# Patient Record
Sex: Female | Born: 1975 | Race: White | Hispanic: No | Smoking: Current some day smoker
Health system: Southern US, Community
[De-identification: ages and names within clinical notes are randomized; demographics above are authoritative.]

## PROBLEM LIST (undated history)

## (undated) DIAGNOSIS — B2790 Infectious mononucleosis, unspecified without complication: Secondary | ICD-10-CM

## (undated) DIAGNOSIS — F411 Generalized anxiety disorder: Secondary | ICD-10-CM

## (undated) DIAGNOSIS — E039 Hypothyroidism, unspecified: Secondary | ICD-10-CM

## (undated) DIAGNOSIS — K7010 Alcoholic hepatitis without ascites: Secondary | ICD-10-CM

## (undated) DIAGNOSIS — K529 Noninfective gastroenteritis and colitis, unspecified: Secondary | ICD-10-CM

## (undated) DIAGNOSIS — H209 Unspecified iridocyclitis: Secondary | ICD-10-CM

## (undated) DIAGNOSIS — N926 Irregular menstruation, unspecified: Secondary | ICD-10-CM

## (undated) DIAGNOSIS — R1031 Right lower quadrant pain: Secondary | ICD-10-CM

## (undated) DIAGNOSIS — K449 Diaphragmatic hernia without obstruction or gangrene: Secondary | ICD-10-CM

## (undated) DIAGNOSIS — H93299 Other abnormal auditory perceptions, unspecified ear: Secondary | ICD-10-CM

## (undated) DIAGNOSIS — R1013 Epigastric pain: Secondary | ICD-10-CM

## (undated) DIAGNOSIS — K219 Gastro-esophageal reflux disease without esophagitis: Secondary | ICD-10-CM

## (undated) DIAGNOSIS — I471 Supraventricular tachycardia, unspecified: Secondary | ICD-10-CM

## (undated) DIAGNOSIS — N83209 Unspecified ovarian cyst, unspecified side: Secondary | ICD-10-CM

## (undated) DIAGNOSIS — F410 Panic disorder [episodic paroxysmal anxiety] without agoraphobia: Secondary | ICD-10-CM

## (undated) DIAGNOSIS — M797 Fibromyalgia: Secondary | ICD-10-CM

## (undated) DIAGNOSIS — M549 Dorsalgia, unspecified: Secondary | ICD-10-CM

## (undated) DIAGNOSIS — F1010 Alcohol abuse, uncomplicated: Secondary | ICD-10-CM

## (undated) DIAGNOSIS — K226 Gastro-esophageal laceration-hemorrhage syndrome: Secondary | ICD-10-CM

## (undated) DIAGNOSIS — I10 Essential (primary) hypertension: Secondary | ICD-10-CM

## (undated) DIAGNOSIS — B9689 Other specified bacterial agents as the cause of diseases classified elsewhere: Secondary | ICD-10-CM

## (undated) DIAGNOSIS — N760 Acute vaginitis: Secondary | ICD-10-CM

## (undated) DIAGNOSIS — F419 Anxiety disorder, unspecified: Secondary | ICD-10-CM

## (undated) DIAGNOSIS — F1011 Alcohol abuse, in remission: Secondary | ICD-10-CM

## (undated) DIAGNOSIS — K920 Hematemesis: Secondary | ICD-10-CM

## (undated) DIAGNOSIS — Z8719 Personal history of other diseases of the digestive system: Secondary | ICD-10-CM

## (undated) DIAGNOSIS — N898 Other specified noninflammatory disorders of vagina: Secondary | ICD-10-CM

## (undated) DIAGNOSIS — A5901 Trichomonal vulvovaginitis: Secondary | ICD-10-CM

## (undated) DIAGNOSIS — E781 Pure hyperglyceridemia: Secondary | ICD-10-CM

## (undated) DIAGNOSIS — E041 Nontoxic single thyroid nodule: Principal | ICD-10-CM

## (undated) DIAGNOSIS — J45909 Unspecified asthma, uncomplicated: Secondary | ICD-10-CM

## (undated) DIAGNOSIS — R635 Abnormal weight gain: Secondary | ICD-10-CM

## (undated) HISTORY — DX: Other specified bacterial agents as the cause of diseases classified elsewhere: B96.89

## (undated) HISTORY — DX: Personal history of other diseases of the digestive system: Z87.19

## (undated) HISTORY — DX: Dorsalgia, unspecified: M54.9

## (undated) HISTORY — DX: Alcohol abuse, in remission: F10.11

## (undated) HISTORY — PX: COLONOSCOPY: SHX174

## (undated) HISTORY — DX: Acute vaginitis: N76.0

## (undated) HISTORY — DX: Right lower quadrant pain: R10.31

## (undated) HISTORY — DX: Hypothyroidism, unspecified: E03.9

## (undated) HISTORY — DX: Panic disorder (episodic paroxysmal anxiety): F41.1

## (undated) HISTORY — DX: Other abnormal auditory perceptions, unspecified ear: H93.299

## (undated) HISTORY — DX: Fibromyalgia: M79.7

## (undated) HISTORY — DX: Other specified noninflammatory disorders of vagina: N89.8

## (undated) HISTORY — DX: Alcoholic hepatitis without ascites: K70.10

## (undated) HISTORY — DX: Pure hyperglyceridemia: E78.1

## (undated) HISTORY — DX: Alcohol abuse, uncomplicated: F10.10

## (undated) HISTORY — DX: Panic disorder (episodic paroxysmal anxiety): F41.0

## (undated) HISTORY — PX: TOOTH EXTRACTION: SUR596

## (undated) HISTORY — DX: Trichomonal vulvovaginitis: A59.01

## (undated) HISTORY — DX: Noninfective gastroenteritis and colitis, unspecified: K52.9

## (undated) HISTORY — DX: Abnormal weight gain: R63.5

## (undated) HISTORY — DX: Irregular menstruation, unspecified: N92.6

## (undated) HISTORY — DX: Unspecified iridocyclitis: H20.9

## (undated) HISTORY — DX: Nontoxic single thyroid nodule: E04.1

## (undated) HISTORY — DX: Unspecified ovarian cyst, unspecified side: N83.209

## (undated) HISTORY — DX: Epigastric pain: R10.13

## (undated) NOTE — *Deleted (*Deleted)
Laparoscopic Cholecystectomy Procedure Note  Indications: This patient presents with symptomatic gallbladder disease and will undergo laparoscopic cholecystectomy.  Pre-operative Diagnosis: Grade IV Splenic laceration  Post-operative Diagnosis: Same  Surgeon: Sophronia Simas, MD  Assistants: Lannette Donath, MD  Anesthesia: General endotracheal anesthesia  ASA Class: 3  Procedure Details  The patient was seen again in the Holding Room. The risks, benefits, complications, treatment options, and expected outcomes were discussed with the patient. The possibilities of reaction to medication, pulmonary aspiration, perforation of viscus, bleeding, recurrent infection, finding a normal gallbladder, the need for additional procedures, failure to diagnose a condition, the possible need to convert to an open procedure, and creating a complication requiring transfusion or operation were discussed with the patient. The likelihood of improving the patient's symptoms with return to their baseline status is good.  The patient and/or family concurred with the proposed plan, giving informed consent. The site of surgery properly noted. The patient was taken to Operating Room, identified as Kara Stewart and the procedure verified as Laparoscopic Cholecystectomy with Intraoperative Cholangiogram. A Time Out was held and the above information confirmed.  Prior to the induction of general anesthesia, antibiotic prophylaxis was administered. General endotracheal anesthesia was then administered and tolerated well. After the induction, the abdomen was prepped with Chloraprep and draped in sterile fashion. The patient was positioned in the supine position.  ***   Findings: - Grade IV splenic laceration with active hemorrhage - Cirrhotic appearing liver  Estimated Blood Loss: 1500 ml         Drains: 19 Fr Blake drain, left upper quadrant         Specimens:  1) Spleen 2) Liver biopsy       Complications: None;  patient tolerated the procedure well.         Disposition: ICU - extubated and stable.         Condition: stable

---

## 2001-11-11 ENCOUNTER — Encounter: Payer: Self-pay | Admitting: Obstetrics and Gynecology

## 2001-11-11 ENCOUNTER — Ambulatory Visit (HOSPITAL_COMMUNITY): Admission: RE | Admit: 2001-11-11 | Discharge: 2001-11-11 | Payer: Self-pay | Admitting: Obstetrics and Gynecology

## 2010-04-26 ENCOUNTER — Encounter: Payer: Self-pay | Admitting: Internal Medicine

## 2010-04-26 ENCOUNTER — Encounter: Payer: Self-pay | Admitting: Family Medicine

## 2011-10-18 ENCOUNTER — Encounter (HOSPITAL_COMMUNITY): Payer: Self-pay

## 2011-10-18 ENCOUNTER — Emergency Department (HOSPITAL_COMMUNITY)
Admission: EM | Admit: 2011-10-18 | Discharge: 2011-10-18 | Disposition: A | Discharge status: Home / Self Care | Payer: Self-pay | Attending: Emergency Medicine | Admitting: Emergency Medicine

## 2011-10-18 DIAGNOSIS — K219 Gastro-esophageal reflux disease without esophagitis: Secondary | ICD-10-CM | POA: Insufficient documentation

## 2011-10-18 DIAGNOSIS — R079 Chest pain, unspecified: Secondary | ICD-10-CM | POA: Insufficient documentation

## 2011-10-18 DIAGNOSIS — R42 Dizziness and giddiness: Secondary | ICD-10-CM | POA: Insufficient documentation

## 2011-10-18 DIAGNOSIS — R7989 Other specified abnormal findings of blood chemistry: Secondary | ICD-10-CM | POA: Insufficient documentation

## 2011-10-18 DIAGNOSIS — R0602 Shortness of breath: Secondary | ICD-10-CM | POA: Insufficient documentation

## 2011-10-18 DIAGNOSIS — R1110 Vomiting, unspecified: Secondary | ICD-10-CM | POA: Insufficient documentation

## 2011-10-18 DIAGNOSIS — R059 Cough, unspecified: Secondary | ICD-10-CM | POA: Insufficient documentation

## 2011-10-18 DIAGNOSIS — R05 Cough: Secondary | ICD-10-CM | POA: Insufficient documentation

## 2011-10-18 DIAGNOSIS — F411 Generalized anxiety disorder: Secondary | ICD-10-CM | POA: Insufficient documentation

## 2011-10-18 DIAGNOSIS — R197 Diarrhea, unspecified: Secondary | ICD-10-CM | POA: Insufficient documentation

## 2011-10-18 DIAGNOSIS — R109 Unspecified abdominal pain: Secondary | ICD-10-CM | POA: Insufficient documentation

## 2011-10-18 DIAGNOSIS — Z79899 Other long term (current) drug therapy: Secondary | ICD-10-CM | POA: Insufficient documentation

## 2011-10-18 HISTORY — DX: Supraventricular tachycardia, unspecified: I47.10

## 2011-10-18 HISTORY — DX: Supraventricular tachycardia: I47.1

## 2011-10-18 HISTORY — DX: Anxiety disorder, unspecified: F41.9

## 2011-10-18 HISTORY — DX: Gastro-esophageal reflux disease without esophagitis: K21.9

## 2011-10-18 HISTORY — DX: Diaphragmatic hernia without obstruction or gangrene: K44.9

## 2011-10-18 LAB — CBC WITH DIFFERENTIAL/PLATELET
Basophils Relative: 1 % (ref 0–1)
Eosinophils Absolute: 0 10*3/uL (ref 0.0–0.7)
MCH: 33.4 pg (ref 26.0–34.0)
MCHC: 34.4 g/dL (ref 30.0–36.0)
Monocytes Relative: 6 % (ref 3–12)
Neutrophils Relative %: 81 % — ABNORMAL HIGH (ref 43–77)
Platelets: 231 10*3/uL (ref 150–400)
RDW: 12.5 % (ref 11.5–15.5)

## 2011-10-18 LAB — LIPASE, BLOOD: Lipase: 41 U/L (ref 11–59)

## 2011-10-18 LAB — COMPREHENSIVE METABOLIC PANEL
Albumin: 4.3 g/dL (ref 3.5–5.2)
Alkaline Phosphatase: 75 U/L (ref 39–117)
BUN: 9 mg/dL (ref 6–23)
Potassium: 4.1 mEq/L (ref 3.5–5.1)
Sodium: 140 mEq/L (ref 135–145)
Total Protein: 7.8 g/dL (ref 6.0–8.3)

## 2011-10-18 MED ORDER — LORAZEPAM 2 MG/ML IJ SOLN
1.0000 mg | Freq: Once | INTRAMUSCULAR | Status: AC
  Administered 2011-10-18: 1 mg via INTRAVENOUS
  Filled 2011-10-18: qty 1

## 2011-10-18 MED ORDER — KETOROLAC TROMETHAMINE 30 MG/ML IJ SOLN
30.0000 mg | Freq: Once | INTRAMUSCULAR | Status: AC
  Administered 2011-10-18: 30 mg via INTRAVENOUS
  Filled 2011-10-18: qty 1

## 2011-10-18 MED ORDER — DIPHENHYDRAMINE HCL 50 MG/ML IJ SOLN
25.0000 mg | Freq: Once | INTRAMUSCULAR | Status: AC
  Administered 2011-10-18: 25 mg via INTRAVENOUS
  Filled 2011-10-18: qty 1

## 2011-10-18 MED ORDER — METOCLOPRAMIDE HCL 5 MG/ML IJ SOLN
10.0000 mg | Freq: Once | INTRAMUSCULAR | Status: AC
  Administered 2011-10-18: 10 mg via INTRAVENOUS
  Filled 2011-10-18: qty 2

## 2011-10-18 MED ORDER — SODIUM CHLORIDE 0.9 % IV SOLN: 1000.0000 mL | Freq: Once | INTRAVENOUS | Status: DC

## 2011-10-18 MED ORDER — SODIUM CHLORIDE 0.9 % IV SOLN: 1000.0000 mL | INTRAVENOUS | Status: DC

## 2011-10-18 MED ORDER — TRAMADOL-ACETAMINOPHEN 37.5-325 MG PO TABS: ORAL_TABLET | ORAL | Status: DC

## 2011-10-18 MED ORDER — PROMETHAZINE HCL 25 MG RE SUPP: 25.0000 mg | Freq: Four times a day (QID) | RECTAL | Status: DC | PRN

## 2011-10-18 MED ORDER — SODIUM CHLORIDE 0.9 % IV SOLN
1000.0000 mL | Freq: Once | INTRAVENOUS | Status: AC
  Administered 2011-10-18: 1000 mL via INTRAVENOUS
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
## 2011-10-18 NOTE — ED Notes (Signed)
Pt reports getting dizzy about 12:00 today, and has been vomiting since. Also having chest tightness all the time, worse when laying flat

## 2011-10-19 ENCOUNTER — Ambulatory Visit (HOSPITAL_COMMUNITY)
Admit: 2011-10-19 | Discharge: 2011-10-19 | Disposition: A | Discharge status: Home / Self Care | Payer: Self-pay | Attending: Emergency Medicine | Admitting: Emergency Medicine

## 2011-10-19 DIAGNOSIS — K7689 Other specified diseases of liver: Secondary | ICD-10-CM | POA: Insufficient documentation

## 2011-10-19 DIAGNOSIS — R7989 Other specified abnormal findings of blood chemistry: Secondary | ICD-10-CM | POA: Insufficient documentation

## 2011-10-19 DIAGNOSIS — R110 Nausea: Secondary | ICD-10-CM | POA: Insufficient documentation

## 2011-10-19 DIAGNOSIS — R1011 Right upper quadrant pain: Secondary | ICD-10-CM | POA: Insufficient documentation

## 2011-10-26 ENCOUNTER — Emergency Department (HOSPITAL_COMMUNITY): Payer: Self-pay

## 2011-10-26 ENCOUNTER — Encounter (HOSPITAL_COMMUNITY): Payer: Self-pay | Admitting: Emergency Medicine

## 2011-10-26 ENCOUNTER — Emergency Department (HOSPITAL_COMMUNITY)
Admission: EM | Admit: 2011-10-26 | Discharge: 2011-10-26 | Disposition: A | Discharge status: Home / Self Care | Payer: Self-pay | Attending: Emergency Medicine | Admitting: Emergency Medicine

## 2011-10-26 DIAGNOSIS — I498 Other specified cardiac arrhythmias: Secondary | ICD-10-CM | POA: Insufficient documentation

## 2011-10-26 DIAGNOSIS — R5383 Other fatigue: Secondary | ICD-10-CM | POA: Insufficient documentation

## 2011-10-26 DIAGNOSIS — I10 Essential (primary) hypertension: Secondary | ICD-10-CM | POA: Insufficient documentation

## 2011-10-26 DIAGNOSIS — R42 Dizziness and giddiness: Secondary | ICD-10-CM | POA: Insufficient documentation

## 2011-10-26 DIAGNOSIS — R200 Anesthesia of skin: Secondary | ICD-10-CM

## 2011-10-26 DIAGNOSIS — R5381 Other malaise: Secondary | ICD-10-CM | POA: Insufficient documentation

## 2011-10-26 DIAGNOSIS — R209 Unspecified disturbances of skin sensation: Secondary | ICD-10-CM | POA: Insufficient documentation

## 2011-10-26 DIAGNOSIS — K219 Gastro-esophageal reflux disease without esophagitis: Secondary | ICD-10-CM | POA: Insufficient documentation

## 2011-10-26 DIAGNOSIS — Z79899 Other long term (current) drug therapy: Secondary | ICD-10-CM | POA: Insufficient documentation

## 2011-10-26 DIAGNOSIS — F411 Generalized anxiety disorder: Secondary | ICD-10-CM | POA: Insufficient documentation

## 2011-10-26 LAB — CBC WITH DIFFERENTIAL/PLATELET
Eosinophils Absolute: 0.1 10*3/uL (ref 0.0–0.7)
Hemoglobin: 14.9 g/dL (ref 12.0–15.0)
Lymphocytes Relative: 17 % (ref 12–46)
Lymphs Abs: 0.9 10*3/uL (ref 0.7–4.0)
MCH: 33.6 pg (ref 26.0–34.0)
Monocytes Relative: 6 % (ref 3–12)
Neutro Abs: 4.1 10*3/uL (ref 1.7–7.7)
Neutrophils Relative %: 75 % (ref 43–77)
RBC: 4.43 MIL/uL (ref 3.87–5.11)

## 2011-10-26 LAB — URINALYSIS, ROUTINE W REFLEX MICROSCOPIC
Bilirubin Urine: NEGATIVE
Ketones, ur: 15 mg/dL — AB
Nitrite: NEGATIVE
Urobilinogen, UA: 0.2 mg/dL (ref 0.0–1.0)

## 2011-10-26 LAB — COMPREHENSIVE METABOLIC PANEL
Alkaline Phosphatase: 73 U/L (ref 39–117)
BUN: 6 mg/dL (ref 6–23)
CO2: 23 mEq/L (ref 19–32)
Chloride: 98 mEq/L (ref 96–112)
GFR calc Af Amer: >90 mL/min (ref >90)
Glucose, Bld: 94 mg/dL (ref 70–99)
Potassium: 3.8 mEq/L (ref 3.5–5.1)
Total Bilirubin: 1 mg/dL (ref 0.3–1.2)

## 2011-10-26 LAB — URINE MICROSCOPIC-ADD ON

## 2011-10-26 LAB — LIPASE, BLOOD: Lipase: 44 U/L (ref 11–59)

## 2011-10-26 MED ORDER — CYCLOBENZAPRINE HCL 10 MG PO TABS: 10.0000 mg | ORAL_TABLET | Freq: Two times a day (BID) | ORAL | Status: DC | PRN

## 2011-10-26 MED ORDER — ONDANSETRON HCL 4 MG/2ML IJ SOLN
4.0000 mg | Freq: Once | INTRAMUSCULAR | Status: AC
  Administered 2011-10-26: 4 mg via INTRAVENOUS
  Filled 2011-10-26: qty 2

## 2011-10-26 MED ORDER — SODIUM CHLORIDE 0.9 % IV BOLUS (SEPSIS)
1000.0000 mL | Freq: Once | INTRAVENOUS | Status: AC
  Administered 2011-10-26: 1000 mL via INTRAVENOUS

## 2011-10-26 MED ORDER — OXYCODONE-ACETAMINOPHEN 5-325 MG PO TABS: 1.0000 | ORAL_TABLET | Freq: Four times a day (QID) | ORAL | Status: DC | PRN

## 2011-10-26 MED ORDER — LORAZEPAM 1 MG PO TABS
1.0000 mg | ORAL_TABLET | Freq: Once | ORAL | Status: AC
Start: 2011-10-26 — End: 2011-10-26
  Administered 2011-10-26: 1 mg via ORAL
  Filled 2011-10-26: qty 1

## 2011-10-26 MED ORDER — PANTOPRAZOLE SODIUM 40 MG IV SOLR
40.0000 mg | Freq: Once | INTRAVENOUS | Status: AC
  Administered 2011-10-26: 40 mg via INTRAVENOUS
  Filled 2011-10-26: qty 40

## 2011-10-26 NOTE — ED Notes (Signed)
Pt states woke up with GI issues, GERD symptoms. Then started having weakness with tinglining and numbness on right side.

## 2011-10-26 NOTE — ED Notes (Signed)
Patient states she is still in extreme pain. RN is aware.

## 2011-10-26 NOTE — ED Notes (Signed)
Patient returned from MRI.

## 2011-10-26 NOTE — ED Notes (Signed)
Family at bedside. 

## 2011-10-26 NOTE — ED Notes (Signed)
Patient transported to MRI 

## 2011-10-26 NOTE — ED Notes (Signed)
Patient would like something to eat at this time. 

## 2011-10-26 NOTE — ED Notes (Signed)
Pt is still in MRI.

## 2011-10-26 NOTE — ED Provider Notes (Addendum)
History   This chart was scribed for Kara Hutching, MD by Sofie Rower. The patient was seen in room [facility] and the patient's care was started at 10:03 AM     CSN: [id number]  Arrival date & time 10/26/11  7829   First MD Initiated Contact with Patient 10/26/11 [phone]      Chief Complaint  Patient presents with  . Extremity Weakness  . Code Stroke    (Consider location/radiation/quality/duration/timing/severity/associated sxs/prior treatment) HPI  Kara Stewart is a 36 y.o. female who presents to the Emergency Department complaining of severe, episodic dizziness onset today with associated symptoms of difficulty swallowing, extremity weakness, extremity numbness. The pt informs the EDP that her right side feels heavy, both arms and legs, as if it has fallen asleep. The pt reports her blood pressure this morning was 153/102. Modifying factors include taking a blood pressure pill which provides moderate relief. Pt also complains of moderate, episodic acid reflux with associated symptoms of abdominal pain. Pt has a hx of hiatal hernia, acid reflux disease, high triglycerides (pt was recommended to go on a diet and refrain from eating fried foods by Dr. Tresa Endo, Cardiologist), SVT.  Pt does not have a PCP. Cardiologist is Dr. Tresa Endo (last visit was over 2 years ago, triglycerides measured, stress test performed).    Past Medical History  Diagnosis Date  . SVT (supraventricular tachycardia)   . Hiatal hernia   . Anxiety   . Acid reflux     History reviewed. No pertinent past surgical history.  History reviewed. No pertinent family history.  History  Substance Use Topics  . Smoking status: Never Smoker   . Smokeless tobacco: Not on file  . Alcohol Use: Yes     occ    OB History    Grav Para Term Preterm Abortions TAB SAB Ect Mult Living                  Review of Systems  All other systems reviewed and are negative.     10 Systems reviewed and all are negative for acute  change except as noted in the HPI.   Allergies  Penicillins  Home Medications   Current Outpatient Rx  Name Route Sig Dispense Refill  . ESOMEPRAZOLE MAGNESIUM 40 MG PO CPDR Oral Take 40 mg by mouth daily.    Marland Kitchen HYDROXYZINE HCL 25 MG PO TABS Oral Take 25 mg by mouth every 6 (six) hours as needed. sleep    . MEDROXYPROGESTERONE ACETATE 150 MG/ML IM SUSP Intramuscular Inject 150 mg into the muscle every 3 (three) months.    Marland Kitchen METOPROLOL TARTRATE 50 MG PO TABS Oral Take 50 mg by mouth as needed. heart    . PROMETHAZINE HCL 25 MG RE SUPP Rectal Place 1 suppository (25 mg total) rectally every 6 (six) hours as needed for nausea. 8 each 0  . TRAMADOL-ACETAMINOPHEN 37.5-325 MG PO TABS  2 tabs po QID prn pain 16 tablet 0    BP 138/88  Pulse 88  Temp 98.4 F (36.9 C) (Oral)  Resp 22  Ht 5\' 5"  (1.651 m)  Wt 200 lb (90.719 kg)  BMI 33.28 kg/m2  SpO2 99%  Physical Exam  Nursing note and vitals reviewed. Constitutional: She is oriented to person, place, and time. She appears well-developed and well-nourished.  HENT:  Head: Atraumatic.  Right Ear: External ear normal.  Left Ear: External ear normal.  Nose: Nose normal.  Neck: Normal range of motion.  Musculoskeletal: Normal range  of motion. She exhibits no tenderness.       Extremities (right arms and legs) shaking. Minor limp noted.   Neurological: She is alert and oriented to person, place, and time.  Skin: Skin is warm and dry.  Psychiatric: She has a normal mood and affect. Her behavior is normal.       No impairment of mental status.     ED Course  Procedures (including critical care time)  DIAGNOSTIC STUDIES: Oxygen Saturation is 99% on room air, normal by my interpretation.    COORDINATION OF CARE:    10:11AM- EDP at bedside discusses treatment plan concerning CT scan, blood work.   Results for orders placed during the hospital encounter of 10/26/11  CBC WITH DIFFERENTIAL      Component Value Range   WBC 5.5  4.0 -  10.5 K/uL   RBC 4.43  3.87 - 5.11 MIL/uL   Hemoglobin 14.9  12.0 - 15.0 g/dL   HCT 16.1  09.6 - 04.5 %   MCV 97.5  78.0 - 100.0 fL   MCH 33.6  26.0 - 34.0 pg   MCHC 34.5  30.0 - 36.0 g/dL   RDW 40.9  81.1 - 91.4 %   Platelets 217  150 - 400 K/uL   Neutrophils Relative 75  43 - 77 %   Neutro Abs 4.1  1.7 - 7.7 K/uL   Lymphocytes Relative 17  12 - 46 %   Lymphs Abs 0.9  0.7 - 4.0 K/uL   Monocytes Relative 6  3 - 12 %   Monocytes Absolute 0.3  0.1 - 1.0 K/uL   Eosinophils Relative 1  0 - 5 %   Eosinophils Absolute 0.1  0.0 - 0.7 K/uL   Basophils Relative 1  0 - 1 %   Basophils Absolute 0.1  0.0 - 0.1 K/uL  COMPREHENSIVE METABOLIC PANEL      Component Value Range   Sodium 136  135 - 145 mEq/L   Potassium 3.8  3.5 - 5.1 mEq/L   Chloride 98  96 - 112 mEq/L   CO2 23  19 - 32 mEq/L   Glucose, Bld 94  70 - 99 mg/dL   BUN 6  6 - 23 mg/dL   Creatinine, Ser 7.82  0.50 - 1.10 mg/dL   Calcium 9.7  8.4 - 95.6 mg/dL   Total Protein 7.6  6.0 - 8.3 g/dL   Albumin 4.1  3.5 - 5.2 g/dL   AST 213 (*) 0 - 37 U/L   ALT 204 (*) 0 - 35 U/L   Alkaline Phosphatase 73  39 - 117 U/L   Total Bilirubin 1.0  0.3 - 1.2 mg/dL   GFR calc non Af Amer 89 (*) >90 mL/min   GFR calc Af Amer >90  >90 mL/min  LIPASE, BLOOD      Component Value Range   Lipase 44  11 - 59 U/L  URINALYSIS, ROUTINE W REFLEX MICROSCOPIC      Component Value Range   Color, Urine YELLOW  YELLOW   APPearance CLEAR  CLEAR   Specific Gravity, Urine 1.025  1.005 - 1.030   pH 6.0  5.0 - 8.0   Glucose, UA NEGATIVE  NEGATIVE mg/dL   Hgb urine dipstick SMALL (*) NEGATIVE   Bilirubin Urine NEGATIVE  NEGATIVE   Ketones, ur 15 (*) NEGATIVE mg/dL   Protein, ur NEGATIVE  NEGATIVE mg/dL   Urobilinogen, UA 0.2  0.0 - 1.0 mg/dL   Nitrite NEGATIVE  NEGATIVE   Leukocytes, UA NEGATIVE  NEGATIVE  URINE MICROSCOPIC-ADD ON      Component Value Range   Squamous Epithelial / LPF MANY (*) RARE   WBC, UA 0-2  <3 WBC/hpf   RBC / HPF 3-6  <3 RBC/hpf    Bacteria, UA MANY (*) RARE   Ct Head Wo Contrast  10/26/2011  *RADIOLOGY REPORT*  Clinical Data: Right arm and right leg numbness, hypertension  CT HEAD WITHOUT CONTRAST  Technique:  Contiguous axial images were obtained from the base of the skull through the vertex without contrast.  Comparison: None  Findings: Normal ventricular morphology. No midline shift or mass effect. Normal appearance brain parenchyma. No intracranial hemorrhage, mass lesion or evidence of acute infarction. No extra-axial fluid collections. Mucosal retention cysts right maxillary sphenoid sinuses. No acute calvarial abnormalities.  IMPRESSION: No acute intracranial abnormalities.  Original Report Authenticated By: Lollie Marrow, M.D.        No diagnosis found.   Date: 10/26/2011  Rate: 89  Rhythm: normal sinus rhythm  QRS Axis: normal  Intervals: normal  ST/T Wave abnormalities: normal  Conduction Disutrbances: none  Narrative Interpretation: unremarkable     MDM  CT scan of head negative. Patient is ambulatory with a limp in right leg. Suspect radicular pain. Will obtain MRI scan of brain to rule out acute stroke.  Discussed with Dr.Zammit      I personally performed the services described in this documentation, which was scribed in my presence. The recorded information has been reviewed and considered.    Kara Hutching, MD 10/26/11 1540  Kara Hutching, MD 10/26/11 [phone]

## 2011-10-26 NOTE — ED Notes (Addendum)
Pt states that she woke with indigestion, reflux type symptoms, took a nexium and felt ?better, pt then started to experience dizziness, headache located in the top of her head, and numbness, tingling, heaviness to entire to right side, pt states that she fell against the wall due to the weakness in her right leg. Pt states that the time the dizziness started was around 8am today, pt speech clear now but pt feels that her speech was "slurred" when she first started getting dizziness.  face is symmetrical,  Pt took her blood pressure at home this am and it was reading 150/102, pt took 1/4 of 50 mg metoprolol  prior to arrival in er. bp 138/88 upon arrival in er, pt able to move all extremities, sensation is the same on both sides,

## 2011-10-26 NOTE — ED Notes (Signed)
Patient was offered a variety of choices of what to eat and the patient refused all. RN aware.

## 2011-10-28 ENCOUNTER — Telehealth: Payer: Self-pay

## 2011-10-28 ENCOUNTER — Ambulatory Visit (INDEPENDENT_AMBULATORY_CARE_PROVIDER_SITE_OTHER): Payer: Self-pay | Admitting: Urgent Care

## 2011-10-28 ENCOUNTER — Encounter: Payer: Self-pay | Admitting: Urgent Care

## 2011-10-28 VITALS — BP 119/84 | HR 87 | Temp 97.4°F | Ht 65.0 in | Wt 199.6 lb

## 2011-10-28 DIAGNOSIS — R7989 Other specified abnormal findings of blood chemistry: Secondary | ICD-10-CM | POA: Insufficient documentation

## 2011-10-28 DIAGNOSIS — R109 Unspecified abdominal pain: Secondary | ICD-10-CM

## 2011-10-28 DIAGNOSIS — K760 Fatty (change of) liver, not elsewhere classified: Secondary | ICD-10-CM

## 2011-10-28 DIAGNOSIS — R1010 Upper abdominal pain, unspecified: Secondary | ICD-10-CM

## 2011-10-28 DIAGNOSIS — K7689 Other specified diseases of liver: Secondary | ICD-10-CM

## 2011-10-28 DIAGNOSIS — R1011 Right upper quadrant pain: Secondary | ICD-10-CM | POA: Insufficient documentation

## 2011-10-28 LAB — HEPATIC FUNCTION PANEL
ALT: 224 U/L — ABNORMAL HIGH (ref 0–35)
Bilirubin, Direct: 0.3 mg/dL (ref 0.0–0.3)
Indirect Bilirubin: 0.5 mg/dL (ref 0.0–0.9)

## 2011-10-28 LAB — HEPATITIS C ANTIBODY: HCV Ab: NEGATIVE

## 2011-10-28 MED ORDER — OMEPRAZOLE 20 MG PO CPDR: 20.0000 mg | DELAYED_RELEASE_CAPSULE | Freq: Every day | ORAL | Status: DC

## 2011-10-28 NOTE — Patient Instructions (Addendum)
Go get your labs today HIDA scan to check function of your gallbladder Begin omeprazole 20mg  daily Stop Nexium Please schedule an appt with a primary care provider.  Dr Jeanice Lim, Dr Lodema Hong or Health Dept just to name a few. Instructions for fatty liver: Recommend 1-2# weight loss per week until ideal body weight through exercise & diet. Low fat/cholesterol diet. Gradually increase exercise from 15 min daily up to 1 hr per day 5 days/week. Limit alcohol use.

## 2011-10-28 NOTE — Progress Notes (Signed)
No PCP on file 

## 2011-10-28 NOTE — Assessment & Plan Note (Signed)
Further work-up (see elevated LFTS) Instructions for fatty liver: Recommend 1-2# weight loss per week until ideal body weight through exercise & diet. Low fat/cholesterol diet. Gradually increase exercise from 15 min daily up to 1 hr per day 5 days/week. Limit alcohol use. Referral given to find PCP to address thyroid & hyperlipidemia

## 2011-10-28 NOTE — Assessment & Plan Note (Addendum)
Kara Stewart is a pleasant 36 y.o. female with several month history of severe upper abdominal pain (mostly RUQ), worse post-prandially & nocturnally, associated with nausea & vomiting that has not responded to Nexium. Symptoms typical for biliary etiology.  Abdominal ultrasound shows fatty liver but no cholecystitis or cholelithiasis.  ? Biliary dyskinesia, GERD, PUD, or less likely viral hepatitis.     HIDA scan, if negative arrange EGD w/ Dr Darrick Penna Stop Nexium Trial omeprazole 20mg  daily If severe pain, go to ER

## 2011-10-28 NOTE — Assessment & Plan Note (Signed)
LFTs 4-5 times elevated in pt with fatty liver & risk factors for liver disease including heavy etoh, hypertriglyceridemia, obesity & questionable thyroid disease.  Denies any current alcohol or new medications.  Will begin lab work-up to r/o NASH, viral hepatitis, autoimmune liver disease, PBC, PSC, Wilson's & A1-AT liver disease.  Further recommendations to follow

## 2011-10-28 NOTE — Telephone Encounter (Signed)
Aram Beecham from [place] called to confirm any [employer] benefits. I called Lubertha Basque and she said she has nothing on the pt. Pt had told Soledad Gerlach when she checked in that she is working on her paperwork for [employer] assistance.  I called Aram Beecham back and informed her, at this time no [facility] benefits.

## 2011-10-28 NOTE — Progress Notes (Signed)
Primary Care Physician:  No primary provider on file. Primary Gastroenterologist:  Dr. Jonette Eva  Chief Complaint  Patient presents with  . Abdominal Pain    pt said she has constant abd pain/ worse when eats    HPI:  Moreen Piggott is a 36 y.o. female here as a new patient for evaluation of GERD, elevated LFTs, & abdominal pain for several months  C/o of what she thought was severe acid reflux, worse nocturnally, but especially made worse by eating.  C/o RUQ & dull chest pain at all times, worse w/ eating.  No particular foods. Pain radiates to umbilicus.  Pain 7/10 at worst.  Does not radiate to back.  C/o nausea & vomiting w/ pain couple times per month.  C/o loose stools 2-3 per day without bleeding or mucus.  Gas & bloating.  Denies fever or chills.  +hot flashes.  Weight gain 30# in past year.  Dx w/ thyroid issues 1 yr ago but no meds needed,.  Tried nexium 40mg  daily no help.  Denies dysphagia or odynophagia.  Records from 2 recent ER visits reviewed.  MRI brain benign.  Visits were for dizziness, paresthesias & weaskness by pt was found to have elevated LFTs on both encounters a week apart.  ABD ultrasound showed fatty liver but nothing to explain pain.  Pt denies hx of elevated LFTs.  She does report heavy alcohol use for at least a year, but has since discontinued.  Denies any hx of viral hepatitis or jaundice.  Denies pruritis.    Recent Results (from the past 336 hour(s))  CBC WITH DIFFERENTIAL   Collection Time   10/18/11  3:55 PM      Component Value Range   WBC 9.4  4.0 - 10.5 K/uL   RBC 4.49  3.87 - 5.11 MIL/uL   Hemoglobin 15.0  12.0 - 15.0 g/dL   HCT 16.1  09.6 - 04.5 %   MCV 97.1  78.0 - 100.0 fL   MCH 33.4  26.0 - 34.0 pg   MCHC 34.4  30.0 - 36.0 g/dL   RDW 40.9  81.1 - 91.4 %   Platelets 231  150 - 400 K/uL   Neutrophils Relative 81 (*) 43 - 77 %   Neutro Abs 7.6  1.7 - 7.7 K/uL   Lymphocytes Relative 12  12 - 46 %   Lymphs Abs 1.2  0.7 - 4.0 K/uL   Monocytes  Relative 6  3 - 12 %   Monocytes Absolute 0.6  0.1 - 1.0 K/uL   Eosinophils Relative 0  0 - 5 %   Eosinophils Absolute 0.0  0.0 - 0.7 K/uL   Basophils Relative 1  0 - 1 %   Basophils Absolute 0.1  0.0 - 0.1 K/uL  COMPREHENSIVE METABOLIC PANEL   Collection Time   10/18/11  3:55 PM      Component Value Range   Sodium 140  135 - 145 mEq/L   Potassium 4.1  3.5 - 5.1 mEq/L   Chloride 103  96 - 112 mEq/L   CO2 24  19 - 32 mEq/L   Glucose, Bld 118 (*) 70 - 99 mg/dL   BUN 9  6 - 23 mg/dL   Creatinine, Ser 7.82  0.50 - 1.10 mg/dL   Calcium 95.6  8.4 - 21.3 mg/dL   Total Protein 7.8  6.0 - 8.3 g/dL   Albumin 4.3  3.5 - 5.2 g/dL   AST 086 (*) 0 - 37  U/L   ALT 228 (*) 0 - 35 U/L   Alkaline Phosphatase 75  39 - 117 U/L   Total Bilirubin 1.2  0.3 - 1.2 mg/dL   GFR calc non Af Amer >90  >90 mL/min   GFR calc Af Amer >90  >90 mL/min  LIPASE, BLOOD   Collection Time   10/18/11  3:55 PM      Component Value Range   Lipase 41  11 - 59 U/L  TROPONIN I   Collection Time   10/18/11  3:55 PM      Component Value Range   Troponin I <0.30  <0.30 ng/mL  CBC WITH DIFFERENTIAL   Collection Time   10/26/11 10:35 AM      Component Value Range   WBC 5.5  4.0 - 10.5 K/uL   RBC 4.43  3.87 - 5.11 MIL/uL   Hemoglobin 14.9  12.0 - 15.0 g/dL   HCT 16.1  09.6 - 04.5 %   MCV 97.5  78.0 - 100.0 fL   MCH 33.6  26.0 - 34.0 pg   MCHC 34.5  30.0 - 36.0 g/dL   RDW 40.9  81.1 - 91.4 %   Platelets 217  150 - 400 K/uL   Neutrophils Relative 75  43 - 77 %   Neutro Abs 4.1  1.7 - 7.7 K/uL   Lymphocytes Relative 17  12 - 46 %   Lymphs Abs 0.9  0.7 - 4.0 K/uL   Monocytes Relative 6  3 - 12 %   Monocytes Absolute 0.3  0.1 - 1.0 K/uL   Eosinophils Relative 1  0 - 5 %   Eosinophils Absolute 0.1  0.0 - 0.7 K/uL   Basophils Relative 1  0 - 1 %   Basophils Absolute 0.1  0.0 - 0.1 K/uL  COMPREHENSIVE METABOLIC PANEL   Collection Time   10/26/11 10:35 AM      Component Value Range   Sodium 136  135 - 145 mEq/L    Potassium 3.8  3.5 - 5.1 mEq/L   Chloride 98  96 - 112 mEq/L   CO2 23  19 - 32 mEq/L   Glucose, Bld 94  70 - 99 mg/dL   BUN 6  6 - 23 mg/dL   Creatinine, Ser 7.82  0.50 - 1.10 mg/dL   Calcium 9.7  8.4 - 95.6 mg/dL   Total Protein 7.6  6.0 - 8.3 g/dL   Albumin 4.1  3.5 - 5.2 g/dL   AST 213 (*) 0 - 37 U/L   ALT 204 (*) 0 - 35 U/L   Alkaline Phosphatase 73  39 - 117 U/L   Total Bilirubin 1.0  0.3 - 1.2 mg/dL   GFR calc non Af Amer 89 (*) >90 mL/min   GFR calc Af Amer >90  >90 mL/min  LIPASE, BLOOD   Collection Time   10/26/11 10:35 AM      Component Value Range   Lipase 44  11 - 59 U/L  URINALYSIS, ROUTINE W REFLEX MICROSCOPIC   Collection Time   10/26/11 10:46 AM      Component Value Range   Color, Urine YELLOW  YELLOW   APPearance CLEAR  CLEAR   Specific Gravity, Urine 1.025  1.005 - 1.030   pH 6.0  5.0 - 8.0   Glucose, UA NEGATIVE  NEGATIVE mg/dL   Hgb urine dipstick SMALL (*) NEGATIVE   Bilirubin Urine NEGATIVE  NEGATIVE   Ketones, ur 15 (*) NEGATIVE mg/dL  Protein, ur NEGATIVE  NEGATIVE mg/dL   Urobilinogen, UA 0.2  0.0 - 1.0 mg/dL   Nitrite NEGATIVE  NEGATIVE   Leukocytes, UA NEGATIVE  NEGATIVE  URINE MICROSCOPIC-ADD ON   Collection Time   10/26/11 10:46 AM      Component Value Range   Squamous Epithelial / LPF MANY (*) RARE   WBC, UA 0-2  <3 WBC/hpf   RBC / HPF 3-6  <3 RBC/hpf   Bacteria, UA MANY (*) RARE     Past Medical History  Diagnosis Date  . SVT (supraventricular tachycardia)   . Hiatal hernia   . Anxiety   . Acid reflux   . Dyspepsia 2004    Dx w/ PUD but no EGD, [place],    . Hypertriglyceridemia   . Hypothyroid     No past surgical history on file.  Current Outpatient Prescriptions  Medication Sig Dispense Refill  . albuterol (PROVENTIL HFA;VENTOLIN HFA) 108 (90 BASE) MCG/ACT inhaler Inhale 2 puffs into the lungs every 6 (six) hours as needed. For shortness of breath      . CINNAMON PO Take 1 capsule by mouth daily.      . cyclobenzaprine  (FLEXERIL) 10 MG tablet Take 1 tablet (10 mg total) by mouth 2 (two) times daily as needed for muscle spasms.  20 tablet  0  . esomeprazole (NEXIUM) 40 MG capsule Take 40 mg by mouth daily.      . hydrOXYzine (ATARAX/VISTARIL) 25 MG tablet Take 25 mg by mouth every 6 (six) hours as needed. For anxiety      . LORazepam (ATIVAN) 2 MG tablet Take 2 mg by mouth 3 (three) times daily as needed. For anxiety      . medroxyPROGESTERone (DEPO-PROVERA) 150 MG/ML injection Inject 150 mg into the muscle every 3 (three) months.      . metoprolol (LOPRESSOR) 50 MG tablet Take 50 mg by mouth as needed. heart      . Multiple Vitamin (MULTIVITAMIN WITH MINERALS) TABS Take 1 tablet by mouth daily.      . OMEGA-3 KRILL OIL PO Take 1 capsule by mouth daily.      Marland Kitchen oxyCODONE-acetaminophen (PERCOCET/ROXICET) 5-325 MG per tablet Take 1-2 tablets by mouth every 6 (six) hours as needed for pain.  20 tablet  0  . polyvinyl alcohol (LIQUIFILM TEARS) 1.4 % ophthalmic solution Place 1 drop into both eyes as needed. For dry eyes      . prednisoLONE acetate (PRED FORTE) 1 % ophthalmic suspension Place 1 drop into the left eye as needed. For swelling in eye      . VITAMIN D, ERGOCALCIFEROL, PO Take 1 tablet by mouth daily.      Marland Kitchen VITAMIN E PO Take 1 capsule by mouth daily.      Marland Kitchen omeprazole (PRILOSEC) 20 MG capsule Take 1 capsule (20 mg total) by mouth daily.  30 capsule  2    Allergies as of 10/28/2011 - Review Complete 10/28/2011  Allergen Reaction Noted  . Penicillins Anaphylaxis 10/18/2011    Family History:There is no known family history of colorectal carcinoma , liver disease, or inflammatory bowel disease.  Problem Relation Age of Onset  . Stomach cancer Paternal Grandfather   . Breast cancer Maternal Grandmother     History   Social History  . Marital Status: Divorced    Spouse Name: N/A    Number of Children: 0  . Years of Education: N/A   Occupational History  . caregiver Actor  Social  History Main Topics  . Smoking status: Never Smoker   . Smokeless tobacco: Not on file  . Alcohol Use: Yes     heavy etoh x 65yr, QUIT 1 yr ago, but still drinks couple drinks per week  . Drug Use: No  . Sexually Active: Yes    Birth Control/ Protection: Injection     depo   Other Topics Concern  . Not on file   Social History Narrative   Lives w/ grandfather or parents or family member (moved from [place] 1 month)Previous MD: Phill Mutter, NP ([place], [place])   Review of Systems: Gen: see HPI CV: Denies chest pain, angina, palpitations, syncope, orthopnea, PND, peripheral edema, and claudication. Resp: Denies dyspnea at rest, dyspnea with exercise, cough, sputum, wheezing, coughing up blood, and pleurisy. GI: Denies vomiting blood, jaundice, and fecal incontinence.   Denies dysphagia or odynophagia. GU : Denies urinary burning, blood in urine, urinary frequency, urinary hesitancy, nocturnal urination, and urinary incontinence. MS: Denies joint pain, limitation of movement, and swelling, stiffness, low back pain, extremity pain. Denies muscle weakness, cramps, atrophy.  Derm: Denies rash, itching, dry skin, hives, moles, warts, or unhealing ulcers.  Psych: Denies depression, anxiety, memory loss, suicidal ideation, hallucinations, paranoia, and confusion. Heme: Denies bruising, bleeding, and enlarged lymph nodes. Neuro:  Denies any headaches, dizziness, paresthesias. Endo:  See HPI  Physical Exam: BP 119/84  Pulse 87  Temp 97.4 F (36.3 C) (Temporal)  Ht 5\' 5"  (1.651 m)  Wt 199 lb 9.6 oz (90.538 kg)  BMI 33.22 kg/m2  LMP 10/28/2010 General:   Alert,  Well-developed, obese, pleasant and cooperative in NAD Head:  Normocephalic and atraumatic. Eyes:  Sclera clear, no icterus.   Conjunctiva pink. Ears:  Normal auditory acuity. Nose:  No deformity, discharge, or lesions. Mouth:  No deformity or lesions,oropharynx pink & moist. Neck:  Supple; no masses or thyromegaly. Lungs:   Clear throughout to auscultation.   No wheezes, crackles, or rhonchi. No acute distress. Heart:  Regular rate and rhythm; no murmurs, clicks, rubs,  or gallops. Abdomen:  Obese.  Normal bowel sounds.  No bruits.  Soft, non-distended without masses, hepatosplenomegaly or hernias noted.  + Murphy's point tenderness.  No guarding or rebound tenderness.  Exam limited given body habitus.  Rectal:  Deferred. Msk:  Symmetrical without gross deformities. Normal posture. Pulses:  Normal pulses noted. Extremities:  No clubbing or edema. Neurologic:  Alert and  oriented x4;  grossly normal neurologically. Skin:  Intact without significant lesions or rashes. Lymph Nodes:  No significant cervical adenopathy. Psych:  Alert and cooperative. Normal mood and affect.

## 2011-10-29 ENCOUNTER — Encounter (HOSPITAL_COMMUNITY)
Admission: RE | Admit: 2011-10-29 | Discharge: 2011-10-29 | Disposition: A | Discharge status: Home / Self Care | Payer: Self-pay | Source: Ambulatory Visit | Attending: Urgent Care | Admitting: Urgent Care

## 2011-10-29 ENCOUNTER — Telehealth: Payer: Self-pay | Admitting: *Deleted

## 2011-10-29 ENCOUNTER — Encounter (HOSPITAL_COMMUNITY): Payer: Self-pay

## 2011-10-29 DIAGNOSIS — R1011 Right upper quadrant pain: Secondary | ICD-10-CM | POA: Insufficient documentation

## 2011-10-29 DIAGNOSIS — R1010 Upper abdominal pain, unspecified: Secondary | ICD-10-CM

## 2011-10-29 HISTORY — DX: Essential (primary) hypertension: I10

## 2011-10-29 HISTORY — DX: Unspecified asthma, uncomplicated: J45.909

## 2011-10-29 LAB — IGG, IGA, IGM
IgA: 274 mg/dL (ref 69–380)
IgM, Serum: 215 mg/dL (ref 52–322)

## 2011-10-29 LAB — ANA: Anti Nuclear Antibody(ANA): NEGATIVE

## 2011-10-29 MED ORDER — TECHNETIUM TC 99M MEBROFENIN IV KIT
5.0000 | PACK | Freq: Once | INTRAVENOUS | Status: AC | PRN
  Administered 2011-10-29: 5.1 via INTRAVENOUS

## 2011-10-29 NOTE — Telephone Encounter (Signed)
LMOM for pt that we are waiting on all of the tests results.

## 2011-10-29 NOTE — Telephone Encounter (Signed)
Kara Stewart called today to find out the results of her tests from yesterday and today. Please call her back. Thanks.

## 2011-10-29 NOTE — Progress Notes (Signed)
Quick Note:  Normal-See lab note ______

## 2011-10-29 NOTE — Progress Notes (Signed)
Quick Note:  HIDA normal. Await all labs ______

## 2011-10-30 LAB — MITOCHONDRIAL ANTIBODIES: Mitochondrial M2 Ab, IgG: 0.64 (ref <0.91)

## 2011-10-30 MED ORDER — DICYCLOMINE HCL 10 MG PO CAPS: 10.0000 mg | ORAL_CAPSULE | Freq: Three times a day (TID) | ORAL | Status: DC

## 2011-10-30 NOTE — Telephone Encounter (Signed)
LMOM to call.

## 2011-10-30 NOTE — Telephone Encounter (Signed)
Pt called this morning to see about her labs. I told her that they had to be send out and it can take some time for them to come back. She understood that. She would like to know if there is anything we can give her to coat her stomach because she can not eat. Please advise

## 2011-10-30 NOTE — Addendum Note (Signed)
Addended by: Joselyn Arrow on: 10/30/2011 12:03 PM   Modules accepted: Orders

## 2011-10-30 NOTE — Telephone Encounter (Signed)
We are still awaiting all labs.  HIDA is normal so it does not appear to be gallbladder. Does Dr. Darrick Penna have EGD available next week?  If so, please arrange. Increase omeprazole to 20mg  before breakfast & dinner To ER if severe pain Thanks

## 2011-10-30 NOTE — Telephone Encounter (Signed)
Pt came to the office. I informed of all of the info and she is scheduled for EGD with Dr. Darrick Penna on 11/05/2011 @ 1:45 PM. She also said she had called this AM and spoke to Ginger requesting something for her pain. She said she could not eat breakfast this morning her stomach hurt so bad, but it some better now. She rates the pain at 6. Please advise!

## 2011-10-30 NOTE — Telephone Encounter (Signed)
Per Lorenza Burton, she can send Bentyl to the pharmacy for pt to help with cramps. Also, pt admitted that she had not gotten the prescription for the Omeprazole but she will get it and begin bid. She was advised per Kandice's note to go to the ED if her pain worsens. Pt would like the prescription sent to [facility].

## 2011-11-02 ENCOUNTER — Other Ambulatory Visit: Payer: Self-pay

## 2011-11-02 DIAGNOSIS — R109 Unspecified abdominal pain: Secondary | ICD-10-CM

## 2011-11-02 NOTE — Progress Notes (Signed)
Quick Note:  Please let pt know the following: Liver numbers still up in 200's, but all other liver tests & thyroid normal Cc:No primary provider on file. Keep EGD as planned. ______

## 2011-11-02 NOTE — Progress Notes (Signed)
Quick Note:  Called and informed pt. She said she will not be able to do the EGD on 11/05/2011 because she will not have a ride. She rescheduled to 11/06/2011 at 1:30 PM. She is aware she will need to be at the hospital at 12:30 Pm. Selena Batten is aware of the change. ______

## 2011-11-05 NOTE — Progress Notes (Signed)
Quick Note:    Noted    ______

## 2011-11-06 ENCOUNTER — Ambulatory Visit (HOSPITAL_COMMUNITY)
Admission: RE | Admit: 2011-11-06 | Discharge: 2011-11-06 | Disposition: A | Discharge status: Home / Self Care | Payer: Self-pay | Source: Ambulatory Visit | Attending: Gastroenterology | Admitting: Gastroenterology

## 2011-11-06 ENCOUNTER — Encounter (HOSPITAL_COMMUNITY)
Admission: RE | Disposition: A | Discharge status: Home / Self Care | Payer: Self-pay | Source: Ambulatory Visit | Attending: Gastroenterology

## 2011-11-06 ENCOUNTER — Encounter (HOSPITAL_COMMUNITY): Payer: Self-pay | Admitting: *Deleted

## 2011-11-06 DIAGNOSIS — R109 Unspecified abdominal pain: Secondary | ICD-10-CM | POA: Insufficient documentation

## 2011-11-06 DIAGNOSIS — K2970 Gastritis, unspecified, without bleeding: Secondary | ICD-10-CM

## 2011-11-06 DIAGNOSIS — R197 Diarrhea, unspecified: Secondary | ICD-10-CM | POA: Insufficient documentation

## 2011-11-06 DIAGNOSIS — K209 Esophagitis, unspecified without bleeding: Secondary | ICD-10-CM

## 2011-11-06 DIAGNOSIS — R110 Nausea: Secondary | ICD-10-CM

## 2011-11-06 DIAGNOSIS — K2940 Chronic atrophic gastritis without bleeding: Secondary | ICD-10-CM | POA: Insufficient documentation

## 2011-11-06 DIAGNOSIS — K222 Esophageal obstruction: Secondary | ICD-10-CM | POA: Insufficient documentation

## 2011-11-06 DIAGNOSIS — K2990 Gastroduodenitis, unspecified, without bleeding: Secondary | ICD-10-CM

## 2011-11-06 HISTORY — PX: ESOPHAGOGASTRODUODENOSCOPY: SHX5428

## 2011-11-06 HISTORY — DX: Panic disorder (episodic paroxysmal anxiety): F41.0

## 2011-11-06 SURGERY — EGD (ESOPHAGOGASTRODUODENOSCOPY)
Anesthesia: Moderate Sedation

## 2011-11-06 MED ORDER — SODIUM CHLORIDE 0.9 % IJ SOLN
INTRAMUSCULAR | Status: AC
  Filled 2011-11-06: qty 10

## 2011-11-06 MED ORDER — MIDAZOLAM HCL 5 MG/5ML IJ SOLN
INTRAMUSCULAR | Status: AC
  Filled 2011-11-06: qty 5

## 2011-11-06 MED ORDER — PROMETHAZINE HCL 25 MG/ML IJ SOLN
INTRAMUSCULAR | Status: DC | PRN
  Administered 2011-11-06 (×2): 12.5 mg via INTRAVENOUS

## 2011-11-06 MED ORDER — MEPERIDINE HCL 100 MG/ML IJ SOLN
INTRAMUSCULAR | Status: DC | PRN
  Administered 2011-11-06: 25 mg via INTRAVENOUS
  Administered 2011-11-06: 50 mg via INTRAVENOUS
  Administered 2011-11-06: 25 mg via INTRAVENOUS
  Administered 2011-11-06: 50 mg via INTRAVENOUS
  Administered 2011-11-06: 25 mg via INTRAVENOUS

## 2011-11-06 MED ORDER — BUTAMBEN-TETRACAINE-BENZOCAINE 2-2-14 % EX AERO
INHALATION_SPRAY | CUTANEOUS | Status: DC | PRN
  Administered 2011-11-06: 2 via TOPICAL

## 2011-11-06 MED ORDER — PROMETHAZINE HCL 25 MG/ML IJ SOLN
12.5000 mg | Freq: Once | INTRAMUSCULAR | Status: AC
  Administered 2011-11-06: 12.5 mg via INTRAVENOUS

## 2011-11-06 MED ORDER — SODIUM CHLORIDE 0.45 % IV SOLN
Freq: Once | INTRAVENOUS | Status: AC
  Administered 2011-11-06: 1000 mL via INTRAVENOUS

## 2011-11-06 MED ORDER — PROMETHAZINE HCL 25 MG/ML IJ SOLN
INTRAMUSCULAR | Status: AC
  Filled 2011-11-06: qty 1

## 2011-11-06 MED ORDER — MEPERIDINE HCL 100 MG/ML IJ SOLN
INTRAMUSCULAR | Status: AC
  Filled 2011-11-06: qty 2

## 2011-11-06 MED ORDER — MIDAZOLAM HCL 5 MG/5ML IJ SOLN
INTRAMUSCULAR | Status: DC | PRN
  Administered 2011-11-06 (×5): 2 mg via INTRAVENOUS

## 2011-11-06 MED ORDER — MIDAZOLAM HCL 5 MG/5ML IJ SOLN
INTRAMUSCULAR | Status: AC
  Filled 2011-11-06: qty 10

## 2011-11-06 MED ORDER — STERILE WATER FOR IRRIGATION IR SOLN
Status: DC | PRN
  Administered 2011-11-06: 10:00:00

## 2011-11-06 MED ORDER — MINERAL OIL PO OIL
TOPICAL_OIL | ORAL | Status: AC
  Filled 2011-11-06: qty 30

## 2011-11-06 NOTE — Op Note (Signed)
[facility] [address] [place], [place]  [address]  ENDOSCOPY PROCEDURE REPORT  PATIENT:  Kara Stewart, Kara Stewart  MR#:  [id number] BIRTHDATE:  1975/07/26, 35 yrs. old  GENDER:  female  ENDOSCOPIST:  Jonette Eva, MD Referred by:  PROCEDURE DATE:  11/06/2011 PROCEDURE:  EGD with biopsy, 43239 ASA CLASS: INDICATIONS:  abd pain, nausea, diarrhea  pmhx: anxiety, weight gain, gb w/u nl  MEDICATIONS:   Demerol 175 mg IV, promethazine (Phenergan) 25 mg IV, Versed 10 mg IV TOPICAL ANESTHETIC:  Cetacaine Spray  DESCRIPTION OF PROCEDURE:     Physical exam was performed. Informed consent was obtained from the patient after explaining the benefits, risks, and alternatives to the procedure.  The patient was connected to the monitor and placed in the left lateral position.  Continuous oxygen was provided by nasal cannula and IV medicine administered through an indwelling cannula.  After administration of sedation, the patient's esophagus was intubated and the EG-[id number] ([id number]) endoscope was advanced under direct visualization to the second portion of the duodenum.  The scope was removed slowly by carefully examining the color, texture, anatomy, and integrity of the mucosa on the way out.  The patient was recovered in endoscopy and discharged home in satisfactory condition. <<PROCEDUREIMAGES>>  HEALING LINEAR EROSIONS WERE FOUND IN TH DISTAL ESOPHAGUS.  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  Moderate gastritis was found & BIOPSIED VIA COLD FORCEPS. NL DUODENUM. BIOPSIES OBTIANED VIA COLD FORCEPS TO EVALUATE FOR CELIAC SPRUE.  COMPLICATIONS:    None  ENDOSCOPIC IMPRESSION: 1) MILD Esophagitis 2) PAENT ESOPHAGEAL Stricture 3) Moderate gastritis  RECOMMENDATIONS: Await biopsy NEXIUM BID LOW FAT DIET LOSE 20LBS AVOID CARBONATED DRINKS AND GATORADE OPV IN 3 MOS  REPEAT EXAM:  No  ______________________________ Jonette Eva, MD  CC:  n. eSIGNED:   Sandi  Fields at 11/06/2011 02:08 PM  [name], [id number]

## 2011-11-06 NOTE — H&P (Signed)
Primary Care Physician:  Sheila Oats, MD Primary Gastroenterologist:  Dr. Darrick Penna  Pre-Procedure History & Physical: HPI:  Kara Stewart is a 36 y.o. female here for ABD PAIN.  Past Medical History  Diagnosis Date  . SVT (supraventricular tachycardia)   . Hiatal hernia   . Anxiety   . Acid reflux   . Dyspepsia 2004    Dx w/ PUD but no EGD, [place], [place]   . Hypertriglyceridemia   . Hypothyroid   . Iritis     frequent  . Hypertension   . Asthma   . Panic attacks     History reviewed. No pertinent past surgical history.  Prior to Admission medications   Medication Sig Start Date End Date Taking? Authorizing Provider  albuterol (PROVENTIL HFA;VENTOLIN HFA) 108 (90 BASE) MCG/ACT inhaler Inhale 2 puffs into the lungs every 6 (six) hours as needed. For shortness of breath   Yes Historical Provider, MD  aspirin-sod bicarb-citric acid (ALKA-SELTZER) 325 MG TBEF Take 325 mg by mouth 2 (two) times daily as needed. For cough/cold   Yes Historical Provider, MD  cholecalciferol (VITAMIN D) 1000 UNITS tablet Take 1,000 Units by mouth daily.   Yes Historical Provider, MD  CINNAMON PO Take 1 capsule by mouth daily.   Yes Historical Provider, MD  dicyclomine (BENTYL) 10 MG capsule Take 1 capsule (10 mg total) by mouth 4 (four) times daily -  before meals and at bedtime. 10/30/11 10/29/12 Yes Joselyn Arrow, NP  esomeprazole (NEXIUM) 40 MG capsule Take 40 mg by mouth 2 (two) times daily.    Yes Historical Provider, MD  hydrOXYzine (ATARAX/VISTARIL) 25 MG tablet Take 25 mg by mouth every 6 (six) hours as needed. For sleep   Yes Historical Provider, MD  loratadine (CLARITIN) 10 MG tablet Take 10 mg by mouth daily. For allergies   Yes Historical Provider, MD  LORazepam (ATIVAN) 2 MG tablet Take 2 mg by mouth 3 (three) times daily as needed. For anxiety   Yes Historical Provider, MD  metoprolol (LOPRESSOR) 50 MG tablet Take 25 mg by mouth as needed. For high blood pressure and fast heart beat   Yes  Historical Provider, MD  Multiple Vitamin (MULTIVITAMIN WITH MINERALS) TABS Take 1 tablet by mouth daily.   Yes Historical Provider, MD  OMEGA-3 KRILL OIL PO Take 1 capsule by mouth daily.   Yes Historical Provider, MD  polyvinyl alcohol (LIQUIFILM TEARS) 1.4 % ophthalmic solution Place 1 drop into both eyes as needed. For dry eyes   Yes Historical Provider, MD  prednisoLONE acetate (PRED FORTE) 1 % ophthalmic suspension Place 1 drop into the left eye as needed. For swelling in eye   Yes Historical Provider, MD  vitamin E 400 UNIT capsule Take 400 Units by mouth daily.   Yes Historical Provider, MD  medroxyPROGESTERone (DEPO-PROVERA) 150 MG/ML injection Inject 150 mg into the muscle every 3 (three) months.    Historical Provider, MD    Allergies as of 11/02/2011 - Review Complete 10/29/2011  Allergen Reaction Noted  . Penicillins Anaphylaxis 10/18/2011    Family History  Problem Relation Age of Onset  . Stomach cancer Paternal Grandfather   . Breast cancer Maternal Grandmother     History   Social History  . Marital Status: Divorced    Spouse Name: N/A    Number of Children: 0  . 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 Smoking status: Never Smoker   .  Smokeless tobacco: Not on file  . Alcohol Use: Yes     heavy etoh x 52yr, QUIT 1 yr ago, but still drinks couple drinks per week  . Drug Use: No  . Sexually Active: Yes    Birth Control/ Protection: Injection     depo   Other Topics Concern  . Not on file   Social History Narrative   Lives w/ grandfather or parents or family member (moved from [place] 1 month)Previous MD: Phill Mutter, NP ([place], [place])    Review of Systems: See HPI, otherwise negative ROS   Physical Exam: BP 133/88  Pulse 106  Temp 97.1 F (36.2 C) (Oral)  Resp 20  Ht 5\' 5"  (1.651 m)  Wt 199 lb (90.266 kg)  BMI 33.12 kg/m2  SpO2 96%  LMP 10/28/2010 General:   Alert,  pleasant  and cooperative in NAD Head:  Normocephalic and atraumatic. Neck:  Supple;  Lungs:  Clear throughout to auscultation.    Heart:  Regular rate and rhythm. Abdomen:  Soft, nontender and nondistended. Normal bowel sounds, without guarding, and without rebound.   Neurologic:  Alert and  oriented x4;  grossly normal neurologically.  Impression/Plan:    ABDOMINAL PAIN  PLAN:  1. EGD TODAY

## 2011-11-11 ENCOUNTER — Encounter (HOSPITAL_COMMUNITY): Payer: Self-pay | Admitting: Gastroenterology

## 2011-11-11 NOTE — Progress Notes (Signed)
EGD/DIL JUL 2013. 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 OPV IN OCT 2013. RECHECK HFP. IF ABNL, PT WILL NEED LIVER Bx.

## 2011-11-13 ENCOUNTER — Telehealth: Payer: Self-pay | Admitting: Gastroenterology

## 2011-11-13 NOTE — Telephone Encounter (Signed)
Pt called this morning to see if her results from procedure are back. Transferred to DS VM

## 2011-11-13 NOTE — Telephone Encounter (Signed)
Called and spoke to pt. Read the post opt dx to pt. Told her when I hear from the biopsy I will give her a call. Seh is aware that Dr. Darrick Penna is on vacation.

## 2011-11-17 ENCOUNTER — Telehealth: Payer: Self-pay | Admitting: *Deleted

## 2011-11-17 NOTE — Telephone Encounter (Signed)
Called LMOM for pt to return call

## 2011-11-17 NOTE — Telephone Encounter (Signed)
Routing to Lorenza Burton, NP to see if she can give results of biopsy for pt in Dr. Darrick Penna absence.

## 2011-11-17 NOTE — Telephone Encounter (Signed)
Please let pt know she has gastritis on biopsy. No celiac disease or h pylori. Await further recommendations from Dr Darrick Penna.

## 2011-11-17 NOTE — Telephone Encounter (Signed)
Kara Stewart called today. She would like someone to call her with the results of her recent biopsy. Please call her back. Thanks.

## 2011-11-18 ENCOUNTER — Telehealth: Payer: Self-pay | Admitting: Gastroenterology

## 2011-11-18 NOTE — Telephone Encounter (Signed)
PLEASE CALL PT.  HER stomach Bx shows gastritis. HER SMALL BOWEL BIOPSIES ARE NORMAL. HER SYMPTOMS ARE MOST LIKELY DUE TO REFLUX AND IBS.   CONTINUE BENTYL. TAKE NEXIUM 30 MINUTE PRIOR TO MEALS TWICE DAILY. FOLLOW A LOW FAT DIET.  LOSE 20 LBS. STOP USING BC POWDERS. AVOID OTHER  ASPIRIN AND NSAIDS FOR 2 WEEKS.  FOLLOW UP IN October 2013.

## 2011-11-18 NOTE — Telephone Encounter (Signed)
OPV W/ KJ  OCT 2013.

## 2011-11-18 NOTE — Telephone Encounter (Signed)
Pt called and was informed.  

## 2011-11-19 NOTE — Telephone Encounter (Signed)
Pt aware of results with no question

## 2011-11-19 NOTE — Telephone Encounter (Signed)
Apt made

## 2011-11-19 NOTE — Telephone Encounter (Signed)
Pt is aware of OV on 11/1 at 9am with KJ

## 2012-02-04 ENCOUNTER — Encounter: Payer: Self-pay | Admitting: Gastroenterology

## 2012-02-05 ENCOUNTER — Telehealth: Payer: Self-pay | Admitting: Urgent Care

## 2012-02-05 ENCOUNTER — Ambulatory Visit: Payer: Self-pay | Admitting: Urgent Care

## 2012-02-05 NOTE — Telephone Encounter (Signed)
Pt was a no show

## 2012-02-05 NOTE — Telephone Encounter (Signed)
Please offer to reschedule.

## 2012-02-22 ENCOUNTER — Encounter: Payer: Self-pay | Admitting: Gastroenterology

## 2012-02-22 NOTE — Telephone Encounter (Signed)
Mailed letter to patient to call our office to set up OV °

## 2012-03-03 ENCOUNTER — Encounter (HOSPITAL_COMMUNITY): Payer: Self-pay | Admitting: *Deleted

## 2012-03-03 ENCOUNTER — Emergency Department (HOSPITAL_COMMUNITY)
Admission: EM | Admit: 2012-03-03 | Discharge: 2012-03-04 | Disposition: A | Discharge status: Home / Self Care | Payer: Self-pay | Attending: Emergency Medicine | Admitting: Emergency Medicine

## 2012-03-03 DIAGNOSIS — E781 Pure hyperglyceridemia: Secondary | ICD-10-CM | POA: Insufficient documentation

## 2012-03-03 DIAGNOSIS — E039 Hypothyroidism, unspecified: Secondary | ICD-10-CM | POA: Insufficient documentation

## 2012-03-03 DIAGNOSIS — Z79899 Other long term (current) drug therapy: Secondary | ICD-10-CM | POA: Insufficient documentation

## 2012-03-03 DIAGNOSIS — H209 Unspecified iridocyclitis: Secondary | ICD-10-CM | POA: Insufficient documentation

## 2012-03-03 DIAGNOSIS — F411 Generalized anxiety disorder: Secondary | ICD-10-CM | POA: Insufficient documentation

## 2012-03-03 DIAGNOSIS — I10 Essential (primary) hypertension: Secondary | ICD-10-CM | POA: Insufficient documentation

## 2012-03-03 DIAGNOSIS — R0789 Other chest pain: Secondary | ICD-10-CM | POA: Insufficient documentation

## 2012-03-03 DIAGNOSIS — R197 Diarrhea, unspecified: Secondary | ICD-10-CM | POA: Insufficient documentation

## 2012-03-03 DIAGNOSIS — J45909 Unspecified asthma, uncomplicated: Secondary | ICD-10-CM | POA: Insufficient documentation

## 2012-03-03 DIAGNOSIS — K219 Gastro-esophageal reflux disease without esophagitis: Secondary | ICD-10-CM | POA: Insufficient documentation

## 2012-03-03 DIAGNOSIS — R1110 Vomiting, unspecified: Secondary | ICD-10-CM | POA: Insufficient documentation

## 2012-03-03 DIAGNOSIS — R509 Fever, unspecified: Secondary | ICD-10-CM | POA: Insufficient documentation

## 2012-03-03 DIAGNOSIS — R42 Dizziness and giddiness: Secondary | ICD-10-CM | POA: Insufficient documentation

## 2012-03-03 DIAGNOSIS — Z8679 Personal history of other diseases of the circulatory system: Secondary | ICD-10-CM | POA: Insufficient documentation

## 2012-03-03 DIAGNOSIS — Z8711 Personal history of peptic ulcer disease: Secondary | ICD-10-CM | POA: Insufficient documentation

## 2012-03-03 DIAGNOSIS — Z8719 Personal history of other diseases of the digestive system: Secondary | ICD-10-CM | POA: Insufficient documentation

## 2012-03-03 LAB — CBC
HCT: 43.4 % (ref 36.0–46.0)
Hemoglobin: 15.3 g/dL — ABNORMAL HIGH (ref 12.0–15.0)
MCH: 34.8 pg — ABNORMAL HIGH (ref 26.0–34.0)
MCHC: 35.3 g/dL (ref 30.0–36.0)
MCV: 98.6 fL (ref 78.0–100.0)
Platelets: 203 10*3/uL (ref 150–400)
RBC: 4.4 MIL/uL (ref 3.87–5.11)
RDW: 12.7 % (ref 11.5–15.5)
WBC: 5.9 10*3/uL (ref 4.0–10.5)

## 2012-03-03 MED ORDER — SODIUM CHLORIDE 0.9 % IV BOLUS (SEPSIS)
1000.0000 mL | Freq: Once | INTRAVENOUS | Status: AC
  Administered 2012-03-03: 1000 mL via INTRAVENOUS

## 2012-03-03 MED ORDER — ONDANSETRON HCL 4 MG/2ML IJ SOLN
4.0000 mg | Freq: Once | INTRAMUSCULAR | Status: AC
  Administered 2012-03-03: 4 mg via INTRAVENOUS
  Filled 2012-03-03: qty 2

## 2012-03-03 NOTE — ED Notes (Signed)
Pt states blood pressure was elevated about 1 1/2 hours ago. Took blood pressure medication & pressure has come down. Pt states vomited 1 time just prior to arrival. States has a tightness in her chest, dry mouth & just feels bad.

## 2012-03-03 NOTE — ED Provider Notes (Signed)
History   This chart was scribed for Raeford Razor, MD by Gerlean Ren, ED Scribe. This patient was seen in room APA11/APA11 and the patient's care was started at 11:20 PM    CSN: [id number]  Arrival date & time 03/03/12  2245   First MD Initiated Contact with Patient 03/03/12 2300      Chief Complaint  Patient presents with  . Hypertension  . Emesis  . Dizziness     The history is provided by the patient. No language interpreter was used.   Yannet Rincon is a 36 y.o. female with h/o anxiety and HTN who presents to the Emergency Department complaining of light-headed dizziness with associated constant chest tightness with no modifying factors, sweaty palms, feeling hot and flushed all with sudden onset while resting at 9:30 PM.  Pt checked BP it was 159/102.  Pt took anxiety medication with no improvements.  Pt reports 4 episodes of non-bloody, non-bilious emesis when arriving at ED and loose stool the past 2 mornings after eating.  Pt denies nausea, vaginal bleeding or discharge, back pain, leg pain, leg swelling.  Denies known sick contacts.  Pt denies tobacco use but reports alcohol use.     Past Medical History  Diagnosis Date  . SVT (supraventricular tachycardia)   . Hiatal hernia   . Anxiety   . Acid reflux   . Dyspepsia 2004    Dx w/ PUD but no EGD, [place], [place]   . Hypertriglyceridemia   . Hypothyroid   . Iritis     frequent  . Hypertension   . Asthma   . Panic attacks     Past Surgical History  Procedure Date  . Esophagogastroduodenoscopy 11/06/2011    [name]: MILD Esophagitis/PAENT ESOPHAGEAL Stricture/  Moderate gastritis    Family History  Problem Relation Age of Onset  . Stomach cancer Paternal Grandfather   . Breast cancer Maternal Grandmother     History  Substance Use Topics  . Smoking status: Never Smoker   . Smokeless tobacco: Not on file  . Alcohol Use: Yes     Comment: admits to drinking every other day    No OB history provided.   Review of  Systems  Constitutional: Negative for fever.  Respiratory: Positive for chest tightness. Negative for shortness of breath.   Cardiovascular: Negative for chest pain.  Gastrointestinal: Positive for vomiting. Negative for nausea and abdominal pain.  Genitourinary: Negative for vaginal bleeding and vaginal discharge.  Musculoskeletal: Negative for back pain.  Psychiatric/Behavioral: The patient is nervous/anxious.   All other systems reviewed and are negative.    Allergies  Penicillins  Home Medications   Current Outpatient Rx  Name  Route  Sig  Dispense  Refill  . ALBUTEROL SULFATE HFA 108 (90 BASE) MCG/ACT IN AERS   Inhalation   Inhale 2 puffs into the lungs every 6 (six) hours as needed. For shortness of breath         . VITAMIN D3 1000 UNITS PO TABS   Oral   Take 1,000 Units by mouth daily.         Marland Kitchen CINNAMON PO   Oral   Take 1 capsule by mouth daily.         Marland Kitchen DICYCLOMINE HCL 10 MG PO CAPS   Oral   Take 1 capsule (10 mg total) by mouth 4 (four) times daily -  before meals and at bedtime.   90 capsule   0   . ESOMEPRAZOLE MAGNESIUM 40 MG  PO CPDR   Oral   Take 40 mg by mouth 2 (two) times daily.          Marland Kitchen HYDROXYZINE HCL 25 MG PO TABS   Oral   Take 25 mg by mouth every 6 (six) hours as needed. For sleep         . LORATADINE 10 MG PO TABS   Oral   Take 10 mg by mouth daily. For allergies         . LORAZEPAM 2 MG PO TABS   Oral   Take 2 mg by mouth 3 (three) times daily as needed. For anxiety         . MEDROXYPROGESTERONE ACETATE 150 MG/ML IM SUSP   Intramuscular   Inject 150 mg into the muscle every 3 (three) months.         Marland Kitchen METOPROLOL TARTRATE 50 MG PO TABS   Oral   Take 25 mg by mouth as needed. For high blood pressure and fast heart beat         . ADULT MULTIVITAMIN W/MINERALS CH   Oral   Take 1 tablet by mouth daily.         . OMEGA-3 KRILL OIL PO   Oral   Take 1 capsule by mouth daily.         Marland Kitchen POLYVINYL ALCOHOL 1.4 %  OP SOLN   Both Eyes   Place 1 drop into both eyes as needed. For dry eyes         . PREDNISOLONE ACETATE 1 % OP SUSP   Left Eye   Place 1 drop into the left eye as needed. For swelling in eye         . VITAMIN E 400 UNITS PO CAPS   Oral   Take 400 Units by mouth daily.           BP 135/78  Pulse 106  Temp 98 F (36.7 C) (Oral)  Resp 20  Ht 5\' 4"  (1.626 m)  Wt 200 lb (90.719 kg)  BMI 34.33 kg/m2  Physical Exam  Nursing note and vitals reviewed. Constitutional: She appears well-developed and well-nourished. No distress.       Anxious appearing.  HENT:  Head: Normocephalic and atraumatic.  Eyes: Conjunctivae normal are normal. Right eye exhibits no discharge. Left eye exhibits no discharge.  Neck: Neck supple.  Cardiovascular: Regular rhythm and normal heart sounds.  Exam reveals no gallop and no friction rub.   No murmur heard.      Mildly tachycardic.  Pulmonary/Chest: Effort normal and breath sounds normal. No respiratory distress.  Abdominal: Soft. She exhibits no distension. There is no tenderness.  Musculoskeletal: She exhibits no edema and no tenderness.  Neurological: She is alert.  Skin: Skin is warm and dry.       Non-specific, non-raised, non-tender rash over bilaterally upper thighs with no drainage.  Psychiatric: She has a normal mood and affect. Her behavior is normal. Thought content normal.    ED Course  Procedures (including critical care time) DIAGNOSTIC STUDIES: No O2 stat taken.  COORDINATION OF CARE: 11:29 PM- Patient informed of clinical course, understands medical decision-making process, and agrees with plan.  Ordered IV fluids, IV Zofran, lactic acid, CBC, c-met, urinalysis, troponin, and chest XR.  Labs Reviewed - No data to display No results found.  EKG:  Rhythm: sinus tachycardia Vent. rate 102 BPM PR interval 150 ms QRS duration 84 ms QT/QTc 372/484 ms ST segments: NS ST  changes  1. Fever   2. Dizziness       MDM      I personally preformed the services scribed in my presence. The recorded information has been reviewed is accurate. Raeford Razor, MD.         Raeford Razor, MD 03/07/12 [phone]

## 2012-03-03 NOTE — ED Notes (Signed)
Pt states here for high BP and dizziness earlier today, once here vomited x 1

## 2012-03-04 ENCOUNTER — Emergency Department (HOSPITAL_COMMUNITY): Payer: Self-pay

## 2012-03-04 LAB — BASIC METABOLIC PANEL
BUN: 6 mg/dL (ref 6–23)
CO2: 23 mEq/L (ref 19–32)
Calcium: 9.6 mg/dL (ref 8.4–10.5)
Chloride: 101 mEq/L (ref 96–112)
Creatinine, Ser: 0.8 mg/dL (ref 0.50–1.10)
GFR calc Af Amer: >90 mL/min (ref >90)
GFR calc non Af Amer: >90 mL/min (ref >90)
Glucose, Bld: 100 mg/dL — ABNORMAL HIGH (ref 70–99)
Potassium: 3.5 mEq/L (ref 3.5–5.1)
Sodium: 137 mEq/L (ref 135–145)

## 2012-03-04 LAB — LACTIC ACID, PLASMA: Lactic Acid, Venous: 2.4 mmol/L — ABNORMAL HIGH (ref 0.5–2.2)

## 2012-03-04 LAB — URINALYSIS, ROUTINE W REFLEX MICROSCOPIC
Bilirubin Urine: NEGATIVE
Glucose, UA: NEGATIVE mg/dL
Hgb urine dipstick: NEGATIVE
Ketones, ur: NEGATIVE mg/dL
Leukocytes, UA: NEGATIVE
Nitrite: NEGATIVE
Protein, ur: NEGATIVE mg/dL
Specific Gravity, Urine: 1.02 (ref 1.005–1.030)
Urobilinogen, UA: 0.2 mg/dL (ref 0.0–1.0)
pH: 6 (ref 5.0–8.0)

## 2012-03-04 LAB — TROPONIN I: Troponin I: <0.3 ng/mL (ref <0.30)

## 2012-03-04 MED ORDER — KETOROLAC TROMETHAMINE 30 MG/ML IJ SOLN
30.0000 mg | Freq: Once | INTRAMUSCULAR | Status: AC
  Administered 2012-03-04: 30 mg via INTRAVENOUS
  Filled 2012-03-04: qty 1

## 2012-03-04 MED ORDER — LORAZEPAM 2 MG/ML IJ SOLN
1.0000 mg | Freq: Once | INTRAMUSCULAR | Status: AC
  Administered 2012-03-04: 1 mg via INTRAVENOUS

## 2012-03-04 MED ORDER — LORAZEPAM 2 MG/ML IJ SOLN
INTRAMUSCULAR | Status: AC
  Filled 2012-03-04: qty 1

## 2012-03-04 NOTE — ED Notes (Signed)
Pt alert & oriented x4, stable gait. Patient given discharge instructions, paperwork & prescription(s). Patient  instructed to stop at the registration desk to finish any additional paperwork. Patient verbalized understanding. Pt left department w/ no further questions. 

## 2012-03-24 ENCOUNTER — Emergency Department (HOSPITAL_COMMUNITY)
Admission: EM | Admit: 2012-03-24 | Discharge: 2012-03-24 | Disposition: A | Discharge status: Home / Self Care | Payer: Self-pay | Attending: Emergency Medicine | Admitting: Emergency Medicine

## 2012-03-24 ENCOUNTER — Encounter (HOSPITAL_COMMUNITY): Payer: Self-pay | Admitting: Emergency Medicine

## 2012-03-24 DIAGNOSIS — F411 Generalized anxiety disorder: Secondary | ICD-10-CM | POA: Insufficient documentation

## 2012-03-24 DIAGNOSIS — F10239 Alcohol dependence with withdrawal, unspecified: Secondary | ICD-10-CM | POA: Insufficient documentation

## 2012-03-24 DIAGNOSIS — Z8679 Personal history of other diseases of the circulatory system: Secondary | ICD-10-CM | POA: Insufficient documentation

## 2012-03-24 DIAGNOSIS — E039 Hypothyroidism, unspecified: Secondary | ICD-10-CM | POA: Insufficient documentation

## 2012-03-24 DIAGNOSIS — I10 Essential (primary) hypertension: Secondary | ICD-10-CM | POA: Insufficient documentation

## 2012-03-24 DIAGNOSIS — R7402 Elevation of levels of lactic acid dehydrogenase (LDH): Secondary | ICD-10-CM | POA: Insufficient documentation

## 2012-03-24 DIAGNOSIS — K219 Gastro-esophageal reflux disease without esophagitis: Secondary | ICD-10-CM | POA: Insufficient documentation

## 2012-03-24 DIAGNOSIS — R7401 Elevation of levels of liver transaminase levels: Secondary | ICD-10-CM | POA: Insufficient documentation

## 2012-03-24 DIAGNOSIS — Z79899 Other long term (current) drug therapy: Secondary | ICD-10-CM | POA: Insufficient documentation

## 2012-03-24 DIAGNOSIS — F410 Panic disorder [episodic paroxysmal anxiety] without agoraphobia: Secondary | ICD-10-CM | POA: Insufficient documentation

## 2012-03-24 DIAGNOSIS — F10939 Alcohol use, unspecified with withdrawal, unspecified: Secondary | ICD-10-CM | POA: Insufficient documentation

## 2012-03-24 DIAGNOSIS — F1020 Alcohol dependence, uncomplicated: Secondary | ICD-10-CM | POA: Insufficient documentation

## 2012-03-24 DIAGNOSIS — Z8719 Personal history of other diseases of the digestive system: Secondary | ICD-10-CM | POA: Insufficient documentation

## 2012-03-24 DIAGNOSIS — Z8669 Personal history of other diseases of the nervous system and sense organs: Secondary | ICD-10-CM | POA: Insufficient documentation

## 2012-03-24 DIAGNOSIS — E781 Pure hyperglyceridemia: Secondary | ICD-10-CM | POA: Insufficient documentation

## 2012-03-24 DIAGNOSIS — J45909 Unspecified asthma, uncomplicated: Secondary | ICD-10-CM | POA: Insufficient documentation

## 2012-03-24 LAB — CBC WITH DIFFERENTIAL/PLATELET
Basophils Absolute: 0.1 10*3/uL (ref 0.0–0.1)
Basophils Relative: 1 % (ref 0–1)
Eosinophils Absolute: 0.2 10*3/uL (ref 0.0–0.7)
Eosinophils Relative: 3 % (ref 0–5)
HCT: 44.5 % (ref 36.0–46.0)
MCH: 35.1 pg — ABNORMAL HIGH (ref 26.0–34.0)
MCHC: 34.8 g/dL (ref 30.0–36.0)
Monocytes Absolute: 0.5 10*3/uL (ref 0.1–1.0)
Monocytes Relative: 7 % (ref 3–12)
Neutro Abs: 4.6 10*3/uL (ref 1.7–7.7)
RDW: 13.5 % (ref 11.5–15.5)

## 2012-03-24 LAB — COMPREHENSIVE METABOLIC PANEL
AST: 135 U/L — ABNORMAL HIGH (ref 0–37)
Albumin: 4.1 g/dL (ref 3.5–5.2)
BUN: 7 mg/dL (ref 6–23)
Calcium: 9.9 mg/dL (ref 8.4–10.5)
Chloride: 99 mEq/L (ref 96–112)
Creatinine, Ser: 0.89 mg/dL (ref 0.50–1.10)
Total Protein: 7.7 g/dL (ref 6.0–8.3)

## 2012-03-24 LAB — ETHANOL: Alcohol, Ethyl (B): <11 mg/dL (ref 0–11)

## 2012-03-24 LAB — PREGNANCY, URINE: Preg Test, Ur: NEGATIVE

## 2012-03-24 MED ORDER — CHLORDIAZEPOXIDE HCL 25 MG PO CAPS
50.0000 mg | ORAL_CAPSULE | Freq: Three times a day (TID) | ORAL | Status: DC | PRN
Start: 2012-03-24 — End: 2012-07-16

## 2012-03-24 MED ORDER — LORAZEPAM 1 MG PO TABS
2.0000 mg | ORAL_TABLET | Freq: Once | ORAL | Status: AC
  Administered 2012-03-24: 2 mg via ORAL
  Filled 2012-03-24: qty 2

## 2012-03-24 NOTE — ED Provider Notes (Signed)
History     CSN: [id number]  Arrival date & time 03/24/12  0226   First MD Initiated Contact with Patient 03/24/12 0234      Chief Complaint  Patient presents with  . Alcohol Problem    (Consider location/radiation/quality/duration/timing/severity/associated sxs/prior treatment) HPI Comments: The pt has a long history of frequent alcohol use -states that she drinks more than a pint of Vodka a day and has been trying to get herself off of alcohol over the last 24 hours.  She states that she read that she shouldn't stop drinking cold [place] so she only drank one pint today and last drink was at 7 PM - she admits to starting to drink early in the morning and drinking all day long a little bit at a time.  She does have alcoholism in the family.  She denies other drugs of abuse and admits to being compliant with her medicines otherwise.  She blames her alcoholism on stress at home - has an ex-husband on whom she has had to get a restraining order as well as the stress of taking care of family members - she was raised here, moved away and has since come back to help care for her parents.  She does not have children and is not currently employed.  She complains of shaking, sweating and nausea - this was gradual in onset, started 2 hours prior to arrival and is gradually getting worse - she denies cough, sob, back pain, dysuria, diarrhea, rashes, headache.  Nothing makes this better, worse with abstaining from ETOH  Patient is a 36 y.o. female presenting with alcohol problem. The history is provided by the patient and medical records.  Alcohol Problem    Past Medical History  Diagnosis Date  . SVT (supraventricular tachycardia)   . Hiatal hernia   . Anxiety   . Acid reflux   . Dyspepsia 2004    Dx w/ PUD but no EGD, [place], [place]   . Hypertriglyceridemia   . Hypothyroid   . Iritis     frequent  . Hypertension   . Asthma   . Panic attacks     Past Surgical History  Procedure Date  .  Esophagogastroduodenoscopy 11/06/2011    [name]: MILD Esophagitis/PAENT ESOPHAGEAL Stricture/  Moderate gastritis    Family History  Problem Relation Age of Onset  . Stomach cancer Paternal Grandfather   . Breast cancer Maternal Grandmother     History  Substance Use Topics  . Smoking status: Never Smoker   . Smokeless tobacco: Not on file  . Alcohol Use: Yes     Comment: admits to drinking every other day    OB History    Grav Para Term Preterm Abortions TAB SAB Ect Mult Living                  Review of Systems  All other systems reviewed and are negative.    Allergies  Penicillins  Home Medications   Current Outpatient Rx  Name  Route  Sig  Dispense  Refill  . ALBUTEROL SULFATE HFA 108 (90 BASE) MCG/ACT IN AERS   Inhalation   Inhale 2 puffs into the lungs every 6 (six) hours as needed. For shortness of breath         . CHLORDIAZEPOXIDE HCL 25 MG PO CAPS   Oral   Take 2 capsules (50 mg total) by mouth 3 (three) times daily as needed (alcohol withdrawal).   20 capsule   0  On day one, take 50mg  by mouth every 6 hours, Day  ...   . VITAMIN D3 1000 UNITS PO TABS   Oral   Take 1,000 Units by mouth daily.         Marland Kitchen CINNAMON PO   Oral   Take 1 capsule by mouth daily.         Marland Kitchen DICYCLOMINE HCL 10 MG PO CAPS   Oral   Take 1 capsule (10 mg total) by mouth 4 (four) times daily -  before meals and at bedtime.   90 capsule   0   . ESOMEPRAZOLE MAGNESIUM 40 MG PO CPDR   Oral   Take 40 mg by mouth 2 (two) times daily.          Marland Kitchen HYDROXYZINE HCL 25 MG PO TABS   Oral   Take 25 mg by mouth every 6 (six) hours as needed. For sleep         . LORATADINE 10 MG PO TABS   Oral   Take 10 mg by mouth daily. For allergies         . LORAZEPAM 2 MG PO TABS   Oral   Take 2 mg by mouth 3 (three) times daily as needed. For anxiety         . MEDROXYPROGESTERONE ACETATE 150 MG/ML IM SUSP   Intramuscular   Inject 150 mg into the muscle every 3 (three)  months.         Marland Kitchen METOPROLOL TARTRATE 50 MG PO TABS   Oral   Take 25 mg by mouth as needed. For high blood pressure and fast heart beat         . ADULT MULTIVITAMIN W/MINERALS CH   Oral   Take 1 tablet by mouth daily.         . OMEGA-3 KRILL OIL PO   Oral   Take 1 capsule by mouth daily.         Marland Kitchen POLYVINYL ALCOHOL 1.4 % OP SOLN   Both Eyes   Place 1 drop into both eyes as needed. For dry eyes         . PREDNISOLONE ACETATE 1 % OP SUSP   Left Eye   Place 1 drop into the left eye as needed. For swelling in eye         . VITAMIN E 400 UNITS PO CAPS   Oral   Take 400 Units by mouth daily.           BP 143/80  Pulse 127  Temp 97.5 F (36.4 C) (Oral)  Resp 20  Wt 200 lb (90.719 kg)  SpO2 100%  Physical Exam  Nursing note and vitals reviewed. Constitutional: She appears well-developed and well-nourished.       Mildly diaphoretic, anxious appearing  HENT:  Head: Normocephalic and atraumatic.  Mouth/Throat: Oropharynx is clear and moist. No oropharyngeal exudate.       OP clear, MMM  Eyes: Conjunctivae normal and EOM are normal. Pupils are equal, round, and reactive to light. Right eye exhibits no discharge. Left eye exhibits no discharge. No scleral icterus.       Pupils 4 mm and reactive bilaterally  Neck: Normal range of motion. Neck supple. No JVD present. No thyromegaly present.  Cardiovascular: Regular rhythm, normal heart sounds and intact distal pulses.  Exam reveals no gallop and no friction rub.   No murmur heard.      Tachycardia to 115 on my exam with  strong radial artery pulses bilaterally  Pulmonary/Chest: Effort normal and breath sounds normal. No respiratory distress. She has no wheezes. She has no rales.  Abdominal: Soft. Bowel sounds are normal. She exhibits no distension and no mass. There is tenderness ( mild periumbilical tenderness).  Musculoskeletal: Normal range of motion. She exhibits no edema and no tenderness.  Lymphadenopathy:     She has no cervical adenopathy.  Neurological: She is alert. Coordination normal.       Mild tremor - clear speech, alert to place, time and person and events.  She moves all extremities to command without difficulty, normal strength.  Skin: Skin is warm. No rash noted. She is diaphoretic. No erythema.       Mild diaphoresis, no other rashes  Psychiatric: She has a normal mood and affect. Her behavior is normal.       Good insight and judgement, clear thought process, no SI, or depression    ED Course  Procedures (including critical care time)  Labs Reviewed  CBC WITH DIFFERENTIAL - Abnormal; Notable for the following:    Hemoglobin 15.5 (*)     MCV 100.7 (*)     MCH 35.1 (*)     All other components within normal limits  COMPREHENSIVE METABOLIC PANEL - Abnormal; Notable for the following:    Glucose, Bld 114 (*)     AST 135 (*)     ALT 130 (*)     Total Bilirubin 1.6 (*)     GFR calc non Af Amer 82 (*)     All other components within normal limits  ETHANOL  PREGNANCY, URINE   No results found.   1. Alcohol withdrawal   2. Transaminitis   3. Alcoholism       MDM  The pt appears to be in alcohol withdrawal - she denies hx of DT's or Seizures and she doesn't want to be admitted to a detox facility but states that she would be willing to try a Benzo taper to help out.  Will give Ativan, check labs and reevaluate.  Tachycarida and sx have improved - pt stable for d/c. She is aware of her elevated liver tests c/w chronic alcohol abuse, she has agreed to take librium as outpt and does not want admission - resource list given to pursue AA and counseling.      Vida Roller, MD 03/24/12 [phone]

## 2012-03-24 NOTE — ED Notes (Signed)
Patient was able to ambulate around the er without any problems.

## 2012-03-24 NOTE — ED Notes (Signed)
Patient states she drinks everyday a little over a pint of alcohol.  States last drink at 7pm last night. States around midnight, her palms began sweating and she began getting anxious.

## 2012-04-29 ENCOUNTER — Other Ambulatory Visit: Payer: Self-pay

## 2012-07-03 ENCOUNTER — Emergency Department (HOSPITAL_COMMUNITY): Payer: Self-pay

## 2012-07-03 ENCOUNTER — Emergency Department (HOSPITAL_COMMUNITY)
Admission: EM | Admit: 2012-07-03 | Discharge: 2012-07-03 | Disposition: A | Discharge status: Home / Self Care | Payer: Self-pay | Attending: Emergency Medicine | Admitting: Emergency Medicine

## 2012-07-03 ENCOUNTER — Encounter (HOSPITAL_COMMUNITY): Payer: Self-pay | Admitting: Emergency Medicine

## 2012-07-03 DIAGNOSIS — Z8679 Personal history of other diseases of the circulatory system: Secondary | ICD-10-CM | POA: Insufficient documentation

## 2012-07-03 DIAGNOSIS — Z8719 Personal history of other diseases of the digestive system: Secondary | ICD-10-CM | POA: Insufficient documentation

## 2012-07-03 DIAGNOSIS — I10 Essential (primary) hypertension: Secondary | ICD-10-CM | POA: Insufficient documentation

## 2012-07-03 DIAGNOSIS — J449 Chronic obstructive pulmonary disease, unspecified: Secondary | ICD-10-CM | POA: Insufficient documentation

## 2012-07-03 DIAGNOSIS — R0789 Other chest pain: Secondary | ICD-10-CM | POA: Insufficient documentation

## 2012-07-03 DIAGNOSIS — J40 Bronchitis, not specified as acute or chronic: Secondary | ICD-10-CM

## 2012-07-03 DIAGNOSIS — Z8639 Personal history of other endocrine, nutritional and metabolic disease: Secondary | ICD-10-CM | POA: Insufficient documentation

## 2012-07-03 DIAGNOSIS — R42 Dizziness and giddiness: Secondary | ICD-10-CM | POA: Insufficient documentation

## 2012-07-03 DIAGNOSIS — J4489 Other specified chronic obstructive pulmonary disease: Secondary | ICD-10-CM | POA: Insufficient documentation

## 2012-07-03 DIAGNOSIS — K219 Gastro-esophageal reflux disease without esophagitis: Secondary | ICD-10-CM | POA: Insufficient documentation

## 2012-07-03 DIAGNOSIS — F410 Panic disorder [episodic paroxysmal anxiety] without agoraphobia: Secondary | ICD-10-CM | POA: Insufficient documentation

## 2012-07-03 DIAGNOSIS — Z862 Personal history of diseases of the blood and blood-forming organs and certain disorders involving the immune mechanism: Secondary | ICD-10-CM | POA: Insufficient documentation

## 2012-07-03 DIAGNOSIS — Z79899 Other long term (current) drug therapy: Secondary | ICD-10-CM | POA: Insufficient documentation

## 2012-07-03 DIAGNOSIS — Z8669 Personal history of other diseases of the nervous system and sense organs: Secondary | ICD-10-CM | POA: Insufficient documentation

## 2012-07-03 LAB — CBC WITH DIFFERENTIAL/PLATELET
Basophils Absolute: 0 10*3/uL (ref 0.0–0.1)
Eosinophils Relative: 2 % (ref 0–5)
HCT: 45.3 % (ref 36.0–46.0)
Lymphocytes Relative: 15 % (ref 12–46)
Lymphs Abs: 0.9 10*3/uL (ref 0.7–4.0)
MCV: 105.8 fL — ABNORMAL HIGH (ref 78.0–100.0)
Monocytes Absolute: 0.4 10*3/uL (ref 0.1–1.0)
Neutro Abs: 4.7 10*3/uL (ref 1.7–7.7)
RBC: 4.28 MIL/uL (ref 3.87–5.11)
RDW: 13.7 % (ref 11.5–15.5)
WBC: 6.1 10*3/uL (ref 4.0–10.5)

## 2012-07-03 LAB — BASIC METABOLIC PANEL
CO2: 24 mEq/L (ref 19–32)
Calcium: 9.6 mg/dL (ref 8.4–10.5)
Chloride: 102 mEq/L (ref 96–112)
Creatinine, Ser: 0.8 mg/dL (ref 0.50–1.10)
Glucose, Bld: 96 mg/dL (ref 70–99)
Sodium: 140 mEq/L (ref 135–145)

## 2012-07-03 LAB — TROPONIN I: Troponin I: <0.3 ng/mL (ref <0.30)

## 2012-07-03 MED ORDER — IOHEXOL 350 MG/ML SOLN
100.0000 mL | Freq: Once | INTRAVENOUS | Status: AC | PRN
  Administered 2012-07-03: 100 mL via INTRAVENOUS

## 2012-07-03 MED ORDER — SODIUM CHLORIDE 0.9 % IV BOLUS (SEPSIS)
1000.0000 mL | Freq: Once | INTRAVENOUS | Status: AC
  Administered 2012-07-03: 1000 mL via INTRAVENOUS

## 2012-07-03 MED ORDER — MORPHINE SULFATE 4 MG/ML IJ SOLN
4.0000 mg | Freq: Once | INTRAMUSCULAR | Status: AC
  Administered 2012-07-03: 4 mg via INTRAVENOUS
  Filled 2012-07-03: qty 1

## 2012-07-03 MED ORDER — AZITHROMYCIN 250 MG PO TABS: ORAL_TABLET | ORAL | Status: DC

## 2012-07-03 MED ORDER — LORAZEPAM 2 MG/ML IJ SOLN
1.0000 mg | Freq: Once | INTRAMUSCULAR | Status: AC
  Administered 2012-07-03: 1 mg via INTRAVENOUS
  Filled 2012-07-03: qty 1

## 2012-07-03 MED ORDER — HYDROCODONE-HOMATROPINE 5-1.5 MG/5ML PO SYRP: 5.0000 mL | ORAL_SOLUTION | Freq: Four times a day (QID) | ORAL | Status: DC | PRN

## 2012-07-03 MED ORDER — ONDANSETRON HCL 4 MG/2ML IJ SOLN
4.0000 mg | Freq: Once | INTRAMUSCULAR | Status: AC
  Administered 2012-07-03: 4 mg via INTRAVENOUS
  Filled 2012-07-03: qty 2

## 2012-07-03 MED ORDER — HYDROCOD POLST-CHLORPHEN POLST 10-8 MG/5ML PO LQCR
5.0000 mL | Freq: Once | ORAL | Status: AC
  Administered 2012-07-03: 5 mL via ORAL
  Filled 2012-07-03: qty 5

## 2012-07-03 MED ORDER — ALBUTEROL SULFATE (5 MG/ML) 0.5% IN NEBU
5.0000 mg | INHALATION_SOLUTION | Freq: Once | RESPIRATORY_TRACT | Status: AC
  Administered 2012-07-03: 5 mg via RESPIRATORY_TRACT
  Filled 2012-07-03: qty 1

## 2012-07-03 MED ORDER — KETOROLAC TROMETHAMINE 30 MG/ML IJ SOLN
30.0000 mg | Freq: Once | INTRAMUSCULAR | Status: AC
  Administered 2012-07-03: 30 mg via INTRAVENOUS
  Filled 2012-07-03: qty 1

## 2012-07-03 MED ORDER — IPRATROPIUM BROMIDE 0.02 % IN SOLN
0.5000 mg | Freq: Once | RESPIRATORY_TRACT | Status: AC
  Administered 2012-07-03: 0.5 mg via RESPIRATORY_TRACT
  Filled 2012-07-03: qty 2.5

## 2012-07-03 MED ORDER — ALBUTEROL SULFATE HFA 108 (90 BASE) MCG/ACT IN AERS
2.0000 | INHALATION_SPRAY | Freq: Once | RESPIRATORY_TRACT | Status: AC
  Administered 2012-07-03: 2 via RESPIRATORY_TRACT
  Filled 2012-07-03: qty 6.7

## 2012-07-03 MED ORDER — PREDNISONE 20 MG PO TABS: ORAL_TABLET | ORAL | Status: DC

## 2012-07-03 NOTE — ED Notes (Signed)
Pt requesting something for anxiety.  edp notified and orders received.

## 2012-07-03 NOTE — ED Notes (Signed)
Pt c/o sudden dry cough with pink/bright red blood. C/o dizziness and chest tightness. C/o abd cramping. Pt alert/oriented. Nad. Color wnl. Mm moist.

## 2012-07-03 NOTE — ED Notes (Signed)
Pt experiencing a decrease in anxiety, no longer shaking.  States she feels "loopy" from pain meds.  Denies relief of pain.  nad noted at this time.

## 2012-07-03 NOTE — ED Provider Notes (Addendum)
History  This chart was scribed for Kara Hutching, MD, by Candelaria Stagers, ED Scribe. This patient was seen in room APA04/APA04 and the patient's care was started at 9:30 AM   CSN: [id number]  Arrival date & time 07/03/12  2956   First MD Initiated Contact with Patient 07/03/12 [phone]      Chief Complaint  Patient presents with  . Dizziness  . Cough    The history is provided by the patient. No language interpreter was used.   Kara Stewart is a 37 y.o. female who presents to the Emergency Department complaining of sudden onset of cough that started earlier today.  Pt reports she has coughed up blood.  She is experiencing chest tightness and dizziness.  Pt has h/o GERD, gastritis, and hiatal hernia.  She denies recent long distance travel or use of birth control pills.  Pt denies ill contacts.    Past Medical History  Diagnosis Date  . SVT (supraventricular tachycardia)   . Hiatal hernia   . Anxiety   . Acid reflux   . Dyspepsia 2004    Dx w/ PUD but no EGD, [place], [place]   . Hypertriglyceridemia   . Hypothyroid   . Iritis     frequent  . Hypertension   . Asthma   . Panic attacks     Past Surgical History  Procedure Laterality Date  . Esophagogastroduodenoscopy  11/06/2011    [name]: MILD Esophagitis/PAENT ESOPHAGEAL Stricture/  Moderate gastritis    Family History  Problem Relation Age of Onset  . Stomach cancer Paternal Grandfather   . Breast cancer Maternal Grandmother     History  Substance Use Topics  . Smoking status: Never Smoker   . Smokeless tobacco: Not on file  . Alcohol Use: Yes     Comment: admits to drinking every other day/liquor    OB History   Grav Para Term Preterm Abortions TAB SAB Ect Mult Living                  Review of Systems  Respiratory: Positive for cough.   Cardiovascular: Positive for chest pain.       Chest tightness  Neurological: Positive for dizziness.  All other systems reviewed and are negative.    Allergies   Penicillins  Home Medications   Current Outpatient Rx  Name  Route  Sig  Dispense  Refill  . aspirin-sod bicarb-citric acid (ALKA-SELTZER) 325 MG TBEF   Oral   Take 325 mg by mouth every 6 (six) hours as needed.         . chlordiazePOXIDE (LIBRIUM) 25 MG capsule   Oral   Take 2 capsules (50 mg total) by mouth 3 (three) times daily as needed (alcohol withdrawal).   20 capsule   0     On day one, take 50mg  by mouth every 6 hours, Day  ...   . esomeprazole (NEXIUM) 40 MG capsule   Oral   Take 40 mg by mouth 2 (two) times daily.          Marland Kitchen LORazepam (ATIVAN) 2 MG tablet   Oral   Take 2 mg by mouth 3 (three) times daily as needed. For anxiety         . metoprolol (LOPRESSOR) 50 MG tablet   Oral   Take 25 mg by mouth as needed. For high blood pressure and fast heart beat         . polyvinyl alcohol (LIQUIFILM TEARS) 1.4 % ophthalmic  solution   Both Eyes   Place 1 drop into both eyes as needed. For dry eyes           BP 151/93  Pulse 109  Temp(Src) 98.3 F (36.8 C) (Oral)  Resp 21  SpO2 97%  Physical Exam  Nursing note and vitals reviewed. Constitutional: She is oriented to person, place, and time. She appears well-developed and well-nourished. No distress.  HENT:  Head: Normocephalic and atraumatic.  Eyes: EOM are normal.  Neck: Neck supple. No tracheal deviation present.  Cardiovascular: Regular rhythm.   Mild tachycardia   Pulmonary/Chest: Effort normal. She has no wheezes. She has no rales.  Musculoskeletal: Normal range of motion.  Neurological: She is alert and oriented to person, place, and time.  Skin: Skin is warm and dry.  Psychiatric: She has a normal mood and affect. Her behavior is normal.    ED Course  Procedures   DIAGNOSTIC STUDIES: Oxygen Saturation is 97% on room air, normal by my interpretation.    COORDINATION OF CARE:  9:34 AM Discussed course of care with pt which includes breathing treatment, pain medication, and chest  xray.  Pt understands and agrees.   Labs Reviewed  CBC WITH DIFFERENTIAL - Abnormal; Notable for the following:    Hemoglobin 15.7 (*)    MCV 105.8 (*)    MCH 36.7 (*)    All other components within normal limits  BASIC METABOLIC PANEL - Abnormal; Notable for the following:    Potassium 3.3 (*)    All other components within normal limits  TROPONIN I   Dg Chest 2 View  07/03/2012  *RADIOLOGY REPORT*  Clinical Data: Dizziness, cough, congestion  CHEST - 2 VIEW  Comparison:  03/04/2012  Findings:  The heart size and mediastinal contours are within normal limits.  Both lungs are clear.  The visualized skeletal structures are unremarkable.  IMPRESSION: No active cardiopulmonary disease.   Original Report Authenticated By: Judie Petit. Miles Costain, M.D.    Ct Angio Chest W/cm &/or Wo Cm  07/03/2012  *RADIOLOGY REPORT*  Clinical Data: Shortness of breath.  Chest tightness.  Anxiety.  CT ANGIOGRAPHY CHEST  Technique:  Multidetector CT imaging of the chest using the standard protocol during bolus administration of intravenous contrast. Multiplanar reconstructed images including MIPs were obtained and reviewed to evaluate the vascular anatomy.  Contrast: OMNIPAQUE IOHEXOL 350 MG/ML SOLN  Comparison: Chest radiographs obtained earlier today.  Findings: Normally opacified pulmonary arteries with no pulmonary arterial filling defects seen.  Clear lungs.  No lung masses or enlarged lymph nodes.  Mild thoracic spine degenerative changes. Marked diffuse low density of the liver relative to the spleen.  IMPRESSION:  1.  No pulmonary emboli or acute abnormality. 2.  Marked hepatic steatosis.   Original Report Authenticated By: Beckie Salts, M.D.      No diagnosis found.   Date: 07/03/2012  Rate: 109  Rhythm: sinus tachycardia  QRS Axis: normal  Intervals: normal  ST/T Wave abnormalities: normal  Conduction Disutrbances:none  Narrative Interpretation:   Old EKG Reviewed: changes noted   MDM  CT angiogram chest  showed no pulmonary embolism.  Will treat for bronchitis.   Rx Zithromax, albuterol inhaler, Hycodan cough syrup, prednisone.  I personally performed the services described in this documentation, which was scribed in my presence. The recorded information has been reviewed and is accurate.        Kara Hutching, MD 07/03/12 1229  Kara Hutching, MD 07/11/12 1414

## 2012-07-16 ENCOUNTER — Encounter (HOSPITAL_COMMUNITY): Payer: Self-pay | Admitting: *Deleted

## 2012-07-16 ENCOUNTER — Emergency Department (HOSPITAL_COMMUNITY)
Admission: EM | Admit: 2012-07-16 | Discharge: 2012-07-16 | Disposition: A | Discharge status: Home / Self Care | Payer: Self-pay | Attending: Emergency Medicine | Admitting: Emergency Medicine

## 2012-07-16 DIAGNOSIS — R112 Nausea with vomiting, unspecified: Secondary | ICD-10-CM | POA: Insufficient documentation

## 2012-07-16 DIAGNOSIS — Z8679 Personal history of other diseases of the circulatory system: Secondary | ICD-10-CM | POA: Insufficient documentation

## 2012-07-16 DIAGNOSIS — Z8719 Personal history of other diseases of the digestive system: Secondary | ICD-10-CM | POA: Insufficient documentation

## 2012-07-16 DIAGNOSIS — Z3202 Encounter for pregnancy test, result negative: Secondary | ICD-10-CM | POA: Insufficient documentation

## 2012-07-16 DIAGNOSIS — R5381 Other malaise: Secondary | ICD-10-CM | POA: Insufficient documentation

## 2012-07-16 DIAGNOSIS — F1010 Alcohol abuse, uncomplicated: Secondary | ICD-10-CM | POA: Insufficient documentation

## 2012-07-16 DIAGNOSIS — A5903 Trichomonal cystitis and urethritis: Secondary | ICD-10-CM

## 2012-07-16 DIAGNOSIS — F410 Panic disorder [episodic paroxysmal anxiety] without agoraphobia: Secondary | ICD-10-CM | POA: Insufficient documentation

## 2012-07-16 DIAGNOSIS — A5909 Other urogenital trichomoniasis: Secondary | ICD-10-CM | POA: Insufficient documentation

## 2012-07-16 DIAGNOSIS — R1110 Vomiting, unspecified: Secondary | ICD-10-CM

## 2012-07-16 DIAGNOSIS — R109 Unspecified abdominal pain: Secondary | ICD-10-CM

## 2012-07-16 DIAGNOSIS — J45909 Unspecified asthma, uncomplicated: Secondary | ICD-10-CM | POA: Insufficient documentation

## 2012-07-16 DIAGNOSIS — I10 Essential (primary) hypertension: Secondary | ICD-10-CM | POA: Insufficient documentation

## 2012-07-16 DIAGNOSIS — E860 Dehydration: Secondary | ICD-10-CM | POA: Insufficient documentation

## 2012-07-16 DIAGNOSIS — K7010 Alcoholic hepatitis without ascites: Secondary | ICD-10-CM | POA: Insufficient documentation

## 2012-07-16 DIAGNOSIS — Z79899 Other long term (current) drug therapy: Secondary | ICD-10-CM | POA: Insufficient documentation

## 2012-07-16 DIAGNOSIS — R197 Diarrhea, unspecified: Secondary | ICD-10-CM | POA: Insufficient documentation

## 2012-07-16 DIAGNOSIS — K219 Gastro-esophageal reflux disease without esophagitis: Secondary | ICD-10-CM | POA: Insufficient documentation

## 2012-07-16 DIAGNOSIS — Z862 Personal history of diseases of the blood and blood-forming organs and certain disorders involving the immune mechanism: Secondary | ICD-10-CM | POA: Insufficient documentation

## 2012-07-16 DIAGNOSIS — Z8639 Personal history of other endocrine, nutritional and metabolic disease: Secondary | ICD-10-CM | POA: Insufficient documentation

## 2012-07-16 DIAGNOSIS — R5383 Other fatigue: Secondary | ICD-10-CM | POA: Insufficient documentation

## 2012-07-16 DIAGNOSIS — K921 Melena: Secondary | ICD-10-CM | POA: Insufficient documentation

## 2012-07-16 DIAGNOSIS — R1084 Generalized abdominal pain: Secondary | ICD-10-CM | POA: Insufficient documentation

## 2012-07-16 LAB — URINE MICROSCOPIC-ADD ON

## 2012-07-16 LAB — POCT PREGNANCY, URINE: Preg Test, Ur: NEGATIVE

## 2012-07-16 LAB — CBC WITH DIFFERENTIAL/PLATELET
Basophils Absolute: 0.1 10*3/uL (ref 0.0–0.1)
Basophils Relative: 1 % (ref 0–1)
Eosinophils Absolute: 0.3 10*3/uL (ref 0.0–0.7)
MCH: 37.1 pg — ABNORMAL HIGH (ref 26.0–34.0)
MCHC: 35.5 g/dL (ref 30.0–36.0)
Neutro Abs: 5.4 10*3/uL (ref 1.7–7.7)
Neutrophils Relative %: 76 % (ref 43–77)
Platelets: 262 10*3/uL (ref 150–400)
RDW: 13.3 % (ref 11.5–15.5)

## 2012-07-16 LAB — COMPREHENSIVE METABOLIC PANEL
ALT: 177 U/L — ABNORMAL HIGH (ref 0–35)
Alkaline Phosphatase: 114 U/L (ref 39–117)
CO2: 21 mEq/L (ref 19–32)
Calcium: 9.4 mg/dL (ref 8.4–10.5)
Chloride: 96 mEq/L (ref 96–112)
GFR calc Af Amer: >90 mL/min (ref >90)
GFR calc non Af Amer: >90 mL/min (ref >90)
Glucose, Bld: 96 mg/dL (ref 70–99)
Potassium: 3.3 mEq/L — ABNORMAL LOW (ref 3.5–5.1)
Sodium: 136 mEq/L (ref 135–145)
Total Bilirubin: 2 mg/dL — ABNORMAL HIGH (ref 0.3–1.2)

## 2012-07-16 LAB — PROTIME-INR: Prothrombin Time: 15.6 seconds — ABNORMAL HIGH (ref 11.6–15.2)

## 2012-07-16 LAB — URINALYSIS, ROUTINE W REFLEX MICROSCOPIC
Glucose, UA: NEGATIVE mg/dL
Hgb urine dipstick: NEGATIVE
Urobilinogen, UA: 1 mg/dL (ref 0.0–1.0)

## 2012-07-16 LAB — OCCULT BLOOD, POC DEVICE: Fecal Occult Bld: NEGATIVE

## 2012-07-16 LAB — LACTIC ACID, PLASMA: Lactic Acid, Venous: 2.3 mmol/L — ABNORMAL HIGH (ref 0.5–2.2)

## 2012-07-16 MED ORDER — ONDANSETRON HCL 8 MG PO TABS: 8.0000 mg | ORAL_TABLET | ORAL | Status: DC | PRN

## 2012-07-16 MED ORDER — LORAZEPAM 1 MG PO TABS: ORAL_TABLET | ORAL | Status: DC

## 2012-07-16 MED ORDER — LORAZEPAM 2 MG/ML IJ SOLN
2.0000 mg | Freq: Once | INTRAMUSCULAR | Status: AC
  Administered 2012-07-16: 2 mg via INTRAVENOUS
  Filled 2012-07-16: qty 1

## 2012-07-16 MED ORDER — ONDANSETRON HCL 4 MG/2ML IJ SOLN
4.0000 mg | Freq: Once | INTRAMUSCULAR | Status: AC
  Administered 2012-07-16: 4 mg via INTRAVENOUS
  Filled 2012-07-16: qty 2

## 2012-07-16 MED ORDER — MORPHINE SULFATE 4 MG/ML IJ SOLN
4.0000 mg | Freq: Once | INTRAMUSCULAR | Status: AC
  Administered 2012-07-16: 4 mg via INTRAVENOUS
  Filled 2012-07-16: qty 1

## 2012-07-16 MED ORDER — SODIUM CHLORIDE 0.9 % IV BOLUS (SEPSIS)
1000.0000 mL | Freq: Once | INTRAVENOUS | Status: AC
  Administered 2012-07-16: 1000 mL via INTRAVENOUS

## 2012-07-16 MED ORDER — PANTOPRAZOLE SODIUM 40 MG IV SOLR
40.0000 mg | Freq: Once | INTRAVENOUS | Status: AC
  Administered 2012-07-16: 40 mg via INTRAVENOUS
  Filled 2012-07-16: qty 40

## 2012-07-16 MED ORDER — METRONIDAZOLE 0.75 % VA GEL: 1.0000 | Freq: Two times a day (BID) | VAGINAL | Status: DC

## 2012-07-16 NOTE — ED Notes (Signed)
Awakend at 0330 this AM w/abdominal cramping, nausea and vomiting/diarrhea.  Vomited/diarrhea x 10.

## 2012-07-16 NOTE — ED Notes (Signed)
Pt c/o abd cramping, nausea, states that the nausea medication has not helped, Dr. Bebe Shaggy notified additional orders given

## 2012-07-16 NOTE — ED Notes (Signed)
Pt given ginger ale, pt and family updated on plan of care

## 2012-07-16 NOTE — ED Notes (Addendum)
Pt also admits to drinking at least a  Pint of alcohol a day, last drink yesterday, pt also reports seeing bright red blood in her stool with her diarrhea.

## 2012-07-16 NOTE — ED Provider Notes (Signed)
History  This chart was scribed for Kara Gaskins, MD by Ardeen Jourdain, ED Scribe. This patient was seen in room APA03/APA03 and the patient's care was started at [facility].  CSN: [id number]  Arrival date & time 07/16/12  1478   First MD Initiated Contact with Patient 07/16/12 0818      Chief Complaint  Patient presents with  . Abdominal Pain  . Nausea  . Emesis  . Diarrhea    Patient is a 37 y.o. female presenting with abdominal pain. The history is provided by the patient. No language interpreter was used.  Abdominal Pain Pain location:  Generalized Pain quality: cramping, shooting and throbbing   Pain radiates to:  Does not radiate Pain severity:  Moderate Onset quality:  Gradual Duration:  5 hours Timing:  Constant Progression:  Worsening Chronicity:  New Context: awakening from sleep   Relieved by:  None tried Worsened by:  Vomiting and bowel movements Ineffective treatments:  None tried Associated symptoms: diarrhea, fatigue, hematemesis, nausea and vomiting   Associated symptoms: no chest pain, no chills, no constipation, no cough, no hematuria, no melena, no shortness of breath and no sore throat   Diarrhea:    Quality:  Watery   Number of occurrences:  10   Severity:  Moderate   Duration:  5 hours   Timing:  Constant   Progression:  Worsening Fatigue:    Severity:  Mild   Duration:  1 day   Timing:  Constant   Progression:  Unchanged Nausea:    Severity:  Moderate   Onset quality:  Gradual   Duration:  5 hours   Timing:  Constant   Progression:  Worsening Risk factors: alcohol abuse     Kara Stewart is a 37 y.o. female with a  H/o SVT, HTN and GERD who presents to the Emergency Department complaining of gradual onset, gradually worsening, constant abdominal pain with associated nausea, hematemesis, emesis and diarrhea. She states the symptoms began 5 hours ago. She states the symptoms woke her from sleep. She states she is an alcohol abuser. She  reports drinking a pint a day. She denies any previous similar symptoms.  She told nurse she had blood in stool, but she denied this on my evaluation  Past Medical History  Diagnosis Date  . SVT (supraventricular tachycardia)   . Hiatal hernia   . Anxiety   . Acid reflux   . Dyspepsia 2004    Dx w/ PUD but no EGD, [place], [place]   . Hypertriglyceridemia   . Hypothyroid   . Iritis     frequent  . Hypertension   . Asthma   . Panic attacks     Past Surgical History  Procedure Laterality Date  . Esophagogastroduodenoscopy  11/06/2011    [name]: MILD Esophagitis/PAENT ESOPHAGEAL Stricture/  Moderate gastritis    Family History  Problem Relation Age of Onset  . Stomach cancer Paternal Grandfather   . Breast cancer Maternal Grandmother     History  Substance Use Topics  . Smoking status: Never Smoker   . Smokeless tobacco: Not on file  . Alcohol Use: Yes     Comment: admits to drinking every other day/liquor   No OB history available.   Review of Systems  Constitutional: Positive for fatigue. Negative for chills.  HENT: Negative for sore throat.   Respiratory: Negative for cough and shortness of breath.   Cardiovascular: Negative for chest pain.  Gastrointestinal: Positive for nausea, vomiting, abdominal pain, diarrhea and  hematemesis. Negative for constipation and melena.  Genitourinary: Negative for hematuria.  All other systems reviewed and are negative.    Allergies  Penicillins  Home Medications   Current Outpatient Rx  Name  Route  Sig  Dispense  Refill  . aspirin-sod bicarb-citric acid (ALKA-SELTZER) 325 MG TBEF   Oral   Take 325 mg by mouth every 6 (six) hours as needed.         Marland Kitchen azithromycin (ZITHROMAX Z-PAK) 250 MG tablet      2 po day one, then 1 daily x 4 days   5 tablet   0   . chlordiazePOXIDE (LIBRIUM) 25 MG capsule   Oral   Take 2 capsules (50 mg total) by mouth 3 (three) times daily as needed (alcohol withdrawal).   20 capsule   0     On  day one, take 50mg  by mouth every 6 hours, Day  ...   . esomeprazole (NEXIUM) 40 MG capsule   Oral   Take 40 mg by mouth 2 (two) times daily.          Marland Kitchen HYDROcodone-homatropine (HYCODAN) 5-1.5 MG/5ML syrup   Oral   Take 5 mLs by mouth every 6 (six) hours as needed for cough.   100 mL   0   . LORazepam (ATIVAN) 2 MG tablet   Oral   Take 2 mg by mouth 3 (three) times daily as needed. For anxiety         . metoprolol (LOPRESSOR) 50 MG tablet   Oral   Take 25 mg by mouth as needed. For high blood pressure and fast heart beat         . polyvinyl alcohol (LIQUIFILM TEARS) 1.4 % ophthalmic solution   Both Eyes   Place 1 drop into both eyes as needed. For dry eyes         . predniSONE (DELTASONE) 20 MG tablet      3 tabs po day one, then 2 tabs daily x 4 days   11 tablet   0     Triage Vitals: BP 140/101  Pulse 110  Temp(Src) 97.4 F (36.3 C) (Oral)  Resp 17  Ht 5\' 4"  (1.626 m)  Wt 190 lb (86.183 kg)  BMI 32.6 kg/m2  SpO2 97%  LMP 03/06/2012 BP 132/80  Pulse 105  Temp(Src) 97.7 F (36.5 C) (Oral)  Resp 20  Ht 5\' 4"  (1.626 m)  Wt 190 lb (86.183 kg)  BMI 32.6 kg/m2  SpO2 100%  LMP 03/06/2012  Physical Exam  CONSTITUTIONAL: Well developed/well nourished, anxious  HEAD: Normocephalic/atraumatic EYES: EOMI/PERRL ENMT: Mucous membranes dry NECK: supple no meningeal signs SPINE:entire spine nontender CV: S1/S2 noted, no murmurs/rubs/gallops noted LUNGS: Lungs are clear to auscultation bilaterally, no apparent distress ABDOMEN: soft, diffuse mild tenderness, no rebound or guarding GU:no cva tenderness Rectal: Stool brown, no blood or melena, chaperone present,  NEURO: Pt is awake/alert, moves all extremitiesx4, bilateral hand tremor  EXTREMITIES: pulses normal, full ROM SKIN: warm, color normal PSYCH: anxious   ED Course  Procedures  DIAGNOSTIC STUDIES: Oxygen Saturation is 97% on room air, normal by my interpretation.    COORDINATION OF  CARE:  8:37 AM-Discussed treatment plan which includes CMP, CBC, lipase, lactic acid, protime-INR, UA and occult blood with pt at bedside and pt agreed to plan.  At this point, she reports vomiting/diarrhea and abdominal cramping.  She is also an alcoholic with at least a pint of etoh per day.  Concerned that  she may be developing ETOH withdrawal.  Will start with IV fluids and ativan.  Will follow closely  10:53 AM Vitals improved Labs reassuring except for worsening hepatitis.  Advised need to quit ETOH.  She requests something to help with possible withdrawal symptoms.  She also is noted to have trich - she requests vaginal treatment as flagyl can have interaction with ETOH Her abdomen is soft without focal tenderness or rigidity.  I doubt acute abdominal process/peritoninitis at this time Lactate mildly elevated but suspect due to dehydration No signs of acute GI bleed at this time by exam  Labs Reviewed  COMPREHENSIVE METABOLIC PANEL  CBC WITH DIFFERENTIAL  LIPASE, BLOOD  LACTIC ACID, PLASMA  PROTIME-INR  URINALYSIS, ROUTINE W REFLEX MICROSCOPIC  OCCULT BLOOD, POC DEVICE     MDM  Nursing notes including past medical history and social history reviewed and considered in documentation Labs/vital reviewed and considered Previous records reviewed and considered      Date: 07/16/2012  Rate: 103  Rhythm: sinus tachycardia  QRS Axis: normal  Intervals: normal  ST/T Wave abnormalities: nonspecific ST changes  Conduction Disutrbances:none  Narrative Interpretation:   Old EKG Reviewed: unchanged    I personally performed the services described in this documentation, which was scribed in my presence. The recorded information has been reviewed and is accurate.      Kara Gaskins, MD 07/16/12 1055

## 2012-07-16 NOTE — ED Notes (Signed)
Dr Wickline at bedside,  

## 2012-07-21 ENCOUNTER — Encounter: Payer: Self-pay | Admitting: Gastroenterology

## 2012-07-21 ENCOUNTER — Ambulatory Visit (INDEPENDENT_AMBULATORY_CARE_PROVIDER_SITE_OTHER): Payer: Self-pay | Admitting: Gastroenterology

## 2012-07-21 VITALS — BP 130/86 | HR 101 | Temp 97.6°F | Ht 65.0 in | Wt 195.6 lb

## 2012-07-21 DIAGNOSIS — R1010 Upper abdominal pain, unspecified: Secondary | ICD-10-CM

## 2012-07-21 DIAGNOSIS — Z87898 Personal history of other specified conditions: Secondary | ICD-10-CM | POA: Insufficient documentation

## 2012-07-21 DIAGNOSIS — K219 Gastro-esophageal reflux disease without esophagitis: Secondary | ICD-10-CM | POA: Insufficient documentation

## 2012-07-21 DIAGNOSIS — K7010 Alcoholic hepatitis without ascites: Secondary | ICD-10-CM

## 2012-07-21 DIAGNOSIS — F1011 Alcohol abuse, in remission: Secondary | ICD-10-CM | POA: Insufficient documentation

## 2012-07-21 DIAGNOSIS — F1010 Alcohol abuse, uncomplicated: Secondary | ICD-10-CM

## 2012-07-21 DIAGNOSIS — R7989 Other specified abnormal findings of blood chemistry: Secondary | ICD-10-CM

## 2012-07-21 DIAGNOSIS — R197 Diarrhea, unspecified: Secondary | ICD-10-CM | POA: Insufficient documentation

## 2012-07-21 DIAGNOSIS — R109 Unspecified abdominal pain: Secondary | ICD-10-CM

## 2012-07-21 HISTORY — DX: Alcoholic hepatitis without ascites: K70.10

## 2012-07-21 HISTORY — DX: Gastro-esophageal reflux disease without esophagitis: K21.9

## 2012-07-21 MED ORDER — DICYCLOMINE HCL 10 MG PO CAPS: 10.0000 mg | ORAL_CAPSULE | Freq: Three times a day (TID) | ORAL | Status: DC | PRN

## 2012-07-21 MED ORDER — DEXLANSOPRAZOLE 60 MG PO CPDR: 60.0000 mg | DELAYED_RELEASE_CAPSULE | Freq: Every day | ORAL | Status: DC

## 2012-07-21 MED ORDER — CHLORDIAZEPOXIDE HCL 25 MG PO CAPS: ORAL_CAPSULE | ORAL | Status: DC

## 2012-07-21 NOTE — Progress Notes (Signed)
Primary Care Physician: Default, Provider, MD  Primary Gastroenterologist:  Jonette Eva, MD   Chief Complaint  Patient presents with  . Abdominal Pain  . Diarrhea  . Abdominal Cramping     HPI: Kara Stewart is a 37 y.o. female here for f/u. She was last seen at time of EGD in 11/2011 at ,which time she was found to have ERE, gastritis.   She was seen in ER recently (07/18/12). Labs as outline below c/w etoh hepatitis.   Abdominal cramping with meals worse for two weeks. Usually would have couple of loose stools daily. Lot of gas. Hurts in ruq pain/ache. Not related to meals. No n/v up until recently when seen in ED, vomiting has gone away. Heartburn on nexium. Some days also takes alkaseltzer fruit chews which contain ASA. Takes twice a day on bad days only. Stopped BC powders since 11/2011.   Used to drink heavy years ago, 3 per week. Was able to quit for many years after going to AA. Now drinking again for about 1.5 years.  Blames on stress related to caring for her grandfather and dad with cancer. Restraining order against ex. Wants to quit again but has withdrawal symptoms when she stops. Develops anxiety and tremors.   60 lb weight gain couple of yrs ago. 207lb at Christmas time. Off Depo since 03/2012 but no cycle yet. Sexually active/condoms. Diarrhea 6 times per day, worse for two weeks. No melena. Toilet tissue brbpr.  Past Surgical History  Procedure Laterality Date  . Esophagogastroduodenoscopy  11/06/2011    [name]: MILD Esophagitis/PATENT ESOPHAGEAL Stricture/  Moderate gastritis. Bx no.hpylori or celiac, +gastritis    Current Outpatient Prescriptions  Medication Sig Dispense Refill  . aspirin-sod bicarb-citric acid (ALKA-SELTZER) 325 MG TBEF Take 325 mg by mouth every 6 (six) hours as needed.      Marland Kitchen esomeprazole (NEXIUM) 40 MG capsule Take 40 mg by mouth 2 (two) times daily.       . metoprolol (LOPRESSOR) 50 MG tablet Take 25 mg by mouth as needed. For high blood pressure  and fast heart beat       No current facility-administered medications for this visit.    Allergies as of 07/21/2012 - Review Complete 07/21/2012  Allergen Reaction Noted  . Penicillins Anaphylaxis 10/18/2011    ROS:  General: Negative for anorexia, weight loss, fever, chills, fatigue, weakness. ENT: Negative for hoarseness, difficulty swallowing , nasal congestion. CV: Negative for chest pain, angina, palpitations, dyspnea on exertion, peripheral edema.  Respiratory: Negative for dyspnea at rest, dyspnea on exertion, cough, sputum, wheezing.  GI: See history of present illness. GU:  Negative for dysuria, hematuria, urinary incontinence, urinary frequency, nocturnal urination.  Endo: Negative for unusual weight change.    Physical Examination:   BP 130/86  Pulse 101  Temp(Src) 97.6 F (36.4 C) (Oral)  Ht 5\' 5"  (1.651 m)  Wt 195 lb 9.6 oz (88.724 kg)  BMI 32.55 kg/m2  LMP 03/06/2012  General: Well-nourished, well-developed in no acute distress.  Eyes: No icterus. Mouth: Oropharyngeal mucosa moist and pink , no lesions erythema or exudate. Lungs: Clear to auscultation bilaterally.  Heart: Regular rate and rhythm, no murmurs rubs or gallops.  Abdomen: Bowel sounds are normal, nontender, nondistended, no hepatosplenomegaly or masses, no abdominal bruits or hernia , no rebound or guarding.   Extremities: No lower extremity edema. No clubbing or deformities. Neuro: Alert and oriented x 4   Skin: Warm and dry, no jaundice.   Psych: Alert and cooperative,  normal mood and affect.  Labs:  Lab Results  Component Value Date   CREATININE 0.78 07/16/2012   BUN 4* 07/16/2012   NA 136 07/16/2012   K 3.3* 07/16/2012   CL 96 07/16/2012   CO2 21 07/16/2012   Lab Results  Component Value Date   ALT 177* 07/16/2012   AST 316* 07/16/2012   ALKPHOS 114 07/16/2012   BILITOT 2.0* 07/16/2012   Lab Results  Component Value Date   WBC 7.1 07/16/2012   HGB 16.4* 07/16/2012   HCT 46.2* 07/16/2012    MCV 104.5* 07/16/2012   PLT 262 07/16/2012   Lab Results  Component Value Date   LIPASE 44 07/16/2012   Lab Results  Component Value Date   INR 1.27 07/16/2012   Previous labs from 10/2011: AMA, alpha 1 antitrypsin, TSH, ANA, immunoglobulins, ceruloplasmin, anti-smooth muscle ab, hep B and C ALL negative/normal.  Imaging Studies: Dg Chest 2 View  07/03/2012  *RADIOLOGY REPORT*  Clinical Data: Dizziness, cough, congestion  CHEST - 2 VIEW  Comparison:  03/04/2012  Findings:  The heart size and mediastinal contours are within normal limits.  Both lungs are clear.  The visualized skeletal structures are unremarkable.  IMPRESSION: No active cardiopulmonary disease.   Original Report Authenticated By: Judie Petit. Miles Costain, M.D.    Ct Angio Chest W/cm &/or Wo Cm  07/03/2012  *RADIOLOGY REPORT*  Clinical Data: Shortness of breath.  Chest tightness.  Anxiety.  CT ANGIOGRAPHY CHEST  Technique:  Multidetector CT imaging of the chest using the standard protocol during bolus administration of intravenous contrast. Multiplanar reconstructed images including MIPs were obtained and reviewed to evaluate the vascular anatomy.  Contrast: OMNIPAQUE IOHEXOL 350 MG/ML SOLN  Comparison: Chest radiographs obtained earlier today.  Findings: Normally opacified pulmonary arteries with no pulmonary arterial filling defects seen.  Clear lungs.  No lung masses or enlarged lymph nodes.  Mild thoracic spine degenerative changes. Marked diffuse low density of the liver relative to the spleen.  IMPRESSION:  1.  No pulmonary emboli or acute abnormality. 2.  Marked hepatic steatosis.   Original Report Authenticated By: Beckie Salts, M.D.

## 2012-07-21 NOTE — Patient Instructions (Addendum)
You need to STOP drinking alcohol to prevent further damage to your liver. Once you decide to stop, you can you Librium as directed to prevent withdrawal symptoms.  Stop Nexium, Start Dexilant one capsule every morning. Samples provided. You can try Bentyl for your diarrhea/abdominal cramps.  Take Restora one capsule daily until complete. Samples provided. Collect your stool specimen and take to the lab. Get your blood work done next Thursday.

## 2012-07-22 ENCOUNTER — Encounter: Payer: Self-pay | Admitting: Gastroenterology

## 2012-07-22 NOTE — Assessment & Plan Note (Signed)
Check C.Diff. Treat for IBS with Bentyl. Add Restora.

## 2012-07-22 NOTE — Assessment & Plan Note (Signed)
37 y/o female with h/o etoh abuse who presents with etoh hepatitis based on labs done in ED few days ago. She readily admits to etoh abuse on daily basis. She also has h/o elevated AST/ALT dating back to at least7/2013. Extensive w/u last year negative except for fatty liver. Patient denies etoh at that time but appears she was drinking then.   Patient desires to stop drinking. Start Librium. She is to take only when she decides to quit etoh. OV in 2 weeks. Go to AA. Repeat labs next week.

## 2012-07-22 NOTE — Assessment & Plan Note (Signed)
Likely secondary to etoh and AlkaSeltzer use. Switch to Dexilant. Stop etoh and ASA. OV 2 weeks.

## 2012-07-22 NOTE — Assessment & Plan Note (Signed)
Patient gets her meds via cash pay. Nexium not working. Samples of Dexilant provided to take one daily, #20.

## 2012-07-25 ENCOUNTER — Encounter: Payer: Self-pay | Admitting: Gastroenterology

## 2012-07-25 NOTE — Progress Notes (Signed)
No PCP on File 

## 2012-08-01 ENCOUNTER — Ambulatory Visit: Payer: Self-pay | Admitting: Gastroenterology

## 2012-08-04 ENCOUNTER — Encounter (HOSPITAL_COMMUNITY): Payer: Self-pay | Admitting: Emergency Medicine

## 2012-08-04 ENCOUNTER — Ambulatory Visit: Payer: Self-pay | Admitting: Gastroenterology

## 2012-08-04 ENCOUNTER — Telehealth: Payer: Self-pay | Admitting: Gastroenterology

## 2012-08-04 ENCOUNTER — Emergency Department (HOSPITAL_COMMUNITY)
Admission: EM | Admit: 2012-08-04 | Discharge: 2012-08-04 | Disposition: A | Discharge status: Home / Self Care | Payer: Self-pay | Attending: Emergency Medicine | Admitting: Emergency Medicine

## 2012-08-04 ENCOUNTER — Emergency Department (HOSPITAL_COMMUNITY): Payer: Self-pay

## 2012-08-04 DIAGNOSIS — K219 Gastro-esophageal reflux disease without esophagitis: Secondary | ICD-10-CM | POA: Insufficient documentation

## 2012-08-04 DIAGNOSIS — Z8679 Personal history of other diseases of the circulatory system: Secondary | ICD-10-CM | POA: Insufficient documentation

## 2012-08-04 DIAGNOSIS — J3489 Other specified disorders of nose and nasal sinuses: Secondary | ICD-10-CM | POA: Insufficient documentation

## 2012-08-04 DIAGNOSIS — J329 Chronic sinusitis, unspecified: Secondary | ICD-10-CM | POA: Insufficient documentation

## 2012-08-04 DIAGNOSIS — Z880 Allergy status to penicillin: Secondary | ICD-10-CM | POA: Insufficient documentation

## 2012-08-04 DIAGNOSIS — Z79899 Other long term (current) drug therapy: Secondary | ICD-10-CM | POA: Insufficient documentation

## 2012-08-04 DIAGNOSIS — Z3202 Encounter for pregnancy test, result negative: Secondary | ICD-10-CM | POA: Insufficient documentation

## 2012-08-04 DIAGNOSIS — Z8669 Personal history of other diseases of the nervous system and sense organs: Secondary | ICD-10-CM | POA: Insufficient documentation

## 2012-08-04 DIAGNOSIS — R05 Cough: Secondary | ICD-10-CM | POA: Insufficient documentation

## 2012-08-04 DIAGNOSIS — R059 Cough, unspecified: Secondary | ICD-10-CM | POA: Insufficient documentation

## 2012-08-04 DIAGNOSIS — Z8719 Personal history of other diseases of the digestive system: Secondary | ICD-10-CM | POA: Insufficient documentation

## 2012-08-04 DIAGNOSIS — I10 Essential (primary) hypertension: Secondary | ICD-10-CM | POA: Insufficient documentation

## 2012-08-04 DIAGNOSIS — J069 Acute upper respiratory infection, unspecified: Secondary | ICD-10-CM | POA: Insufficient documentation

## 2012-08-04 DIAGNOSIS — Z8639 Personal history of other endocrine, nutritional and metabolic disease: Secondary | ICD-10-CM | POA: Insufficient documentation

## 2012-08-04 DIAGNOSIS — Z862 Personal history of diseases of the blood and blood-forming organs and certain disorders involving the immune mechanism: Secondary | ICD-10-CM | POA: Insufficient documentation

## 2012-08-04 DIAGNOSIS — J45909 Unspecified asthma, uncomplicated: Secondary | ICD-10-CM | POA: Insufficient documentation

## 2012-08-04 DIAGNOSIS — F411 Generalized anxiety disorder: Secondary | ICD-10-CM | POA: Insufficient documentation

## 2012-08-04 LAB — POCT PREGNANCY, URINE: Preg Test, Ur: NEGATIVE

## 2012-08-04 MED ORDER — FEXOFENADINE-PSEUDOEPHED ER 60-120 MG PO TB12: 1.0000 | ORAL_TABLET | Freq: Two times a day (BID) | ORAL | Status: DC

## 2012-08-04 MED ORDER — PROMETHAZINE-CODEINE 6.25-10 MG/5ML PO SYRP: 5.0000 mL | ORAL_SOLUTION | Freq: Four times a day (QID) | ORAL | Status: DC | PRN

## 2012-08-04 NOTE — ED Notes (Signed)
Pt reports she was seen last month for bronchitis. Pt reports that she hasn't gotten any better and has had a fever x 3 days, productive cough, lower back pain. Pt denies n/v/d. Pt alert and oriented.nad noted.

## 2012-08-04 NOTE — ED Provider Notes (Signed)
History     CSN: [id number]  Arrival date & time 08/04/12  0932   First MD Initiated Contact with Patient 08/04/12 [phone]      Chief Complaint  Patient presents with  . Fever  . Cough    (Consider location/radiation/quality/duration/timing/severity/associated sxs/prior treatment) Patient is a 37 y.o. female presenting with cough. The history is provided by the patient.  Cough Cough characteristics:  Productive Sputum characteristics:  Clear Severity:  Moderate Onset quality:  Gradual Duration:  8 days Timing:  Intermittent Progression:  Worsening Chronicity:  Recurrent Smoker: no   Context: upper respiratory infection and weather changes   Context: not sick contacts   Relieved by:  Nothing Worsened by:  Nothing tried Ineffective treatments:  Rest (rubbing alcohol) Associated symptoms: chills, fever and sinus congestion   Associated symptoms: no chest pain, no eye discharge, no rash, no shortness of breath and no wheezing     Past Medical History  Diagnosis Date  . SVT (supraventricular tachycardia)   . Hiatal hernia   . Anxiety   . Acid reflux   . Dyspepsia 2004    Dx w/ PUD but no EGD, [place], [place]   . Hypertriglyceridemia   . Hypothyroid   . Iritis     frequent  . Hypertension   . Asthma   . Panic attacks     Past Surgical History  Procedure Laterality Date  . Esophagogastroduodenoscopy  11/06/2011    [name]: MILD Esophagitis/PATENT ESOPHAGEAL Stricture/  Moderate gastritis. Bx no.hpylori or celiac, +gastritis    Family History  Problem Relation Age of Onset  . Stomach cancer Paternal Grandfather   . Breast cancer Maternal Grandmother     History  Substance Use Topics  . Smoking status: Never Smoker   . Smokeless tobacco: Not on file  . Alcohol Use: No     Comment: pt reports she quit x1 week.    OB History   Grav Para Term Preterm Abortions TAB SAB Ect Mult Living                  Review of Systems  Constitutional: Positive for fever and  chills. Negative for activity change.       All ROS Neg except as noted in HPI  HENT: Negative for nosebleeds and neck pain.   Eyes: Negative for photophobia and discharge.  Respiratory: Positive for cough. Negative for shortness of breath and wheezing.   Cardiovascular: Negative for chest pain and palpitations.  Gastrointestinal: Negative for abdominal pain and blood in stool.  Genitourinary: Negative for dysuria, frequency and hematuria.  Musculoskeletal: Negative for back pain and arthralgias.  Skin: Negative.  Negative for rash.  Neurological: Negative for dizziness, seizures and speech difficulty.  Psychiatric/Behavioral: Negative for hallucinations and confusion.    Allergies  Penicillins  Home Medications   Current Outpatient Rx  Name  Route  Sig  Dispense  Refill  . albuterol (PROVENTIL HFA;VENTOLIN HFA) 108 (90 BASE) MCG/ACT inhaler   Inhalation   Inhale 2 puffs into the lungs every 6 (six) hours as needed for wheezing or shortness of breath.         . Aspirin Effervescent (ALKA-SELTZER PO)   Oral   Take 1 tablet by mouth as needed (stomace pain).         . chlordiazePOXIDE (LIBRIUM) 25 MG capsule      25 mg. Take one to two tablets every 8 hours on day 1, then 1 tablet every six hours on day 2 and  day 3. Then 1 tablet three times a day as needed. Do not take with alcohol.         . dexlansoprazole (DEXILANT) 60 MG capsule   Oral   Take 60 mg by mouth daily.         Marland Kitchen LORazepam (ATIVAN) 2 MG tablet   Oral   Take 2 mg by mouth 3 (three) times daily as needed for anxiety.         . metoprolol (LOPRESSOR) 50 MG tablet   Oral   Take 25 mg by mouth as needed (blood pressure , pulse rate). For high blood pressure and fast heart beat         . Polyvinyl Alcohol-Povidone (REFRESH OP)   Both Eyes   Place 2 drops into both eyes as needed (dry eyes).         . Probiotic Product (RESTORA PO)   Oral   Take 1 tablet by mouth daily.           BP 101/38   Pulse 130  Temp(Src) 98.4 F (36.9 C) (Oral)  Ht 5\' 4"  (1.626 m)  Wt 190 lb (86.183 kg)  BMI 32.6 kg/m2  SpO2 96%  LMP 03/06/2012  Physical Exam  Nursing note and vitals reviewed. Constitutional: She is oriented to person, place, and time. She appears well-developed and well-nourished.  Non-toxic appearance.  HENT:  Head: Normocephalic.  Right Ear: Tympanic membrane and external ear normal.  Left Ear: Tympanic membrane and external ear normal.  Eyes: EOM and lids are normal. Pupils are equal, round, and reactive to light.  Neck: Normal range of motion. Neck supple. Carotid bruit is not present.  Cardiovascular: Normal rate, regular rhythm, normal heart sounds, intact distal pulses and normal pulses.   Pulmonary/Chest: Breath sounds normal. No respiratory distress.  Abdominal: Soft. Bowel sounds are normal. There is no tenderness. There is no guarding.  Musculoskeletal: Normal range of motion.  Lymphadenopathy:       Head (right side): No submandibular adenopathy present.       Head (left side): No submandibular adenopathy present.    She has no cervical adenopathy.  Neurological: She is alert and oriented to person, place, and time. She has normal strength. No cranial nerve deficit or sensory deficit.  Skin: Skin is warm and dry.  Psychiatric: She has a normal mood and affect. Her speech is normal.    ED Course  Procedures (including critical care time)  Labs Reviewed - No data to display No results found.  Pulse Ox 96% on room air. WNL by my interpretation. No diagnosis found.    MDM  I have reviewed nursing notes, vital signs, and all appropriate lab and imaging results for this patient.  Pt reports 3 days of fever and cough. No hemoptosis. Temp 98.4. Pulse elevated at 130 on admission. PUlse ox 96% on room air. Pt speaks in complete sentences.  Chest xray reveals no acute abnormality. Pt ambulatory in room without problem. Suspect bronchitis with recurrent URI. Plan  - Allegra D and promethazine cough medication. Pt to increase fluids. She is to return if not improving, or change in condition.      Kathie Dike, PA-C 08/08/12 2218

## 2012-08-04 NOTE — Telephone Encounter (Signed)
Pt was a no show

## 2012-08-06 ENCOUNTER — Encounter (HOSPITAL_COMMUNITY): Payer: Self-pay

## 2012-08-06 ENCOUNTER — Emergency Department (HOSPITAL_COMMUNITY): Payer: Self-pay

## 2012-08-06 ENCOUNTER — Inpatient Hospital Stay (HOSPITAL_COMMUNITY)
Admission: EM | Admit: 2012-08-06 | Discharge: 2012-08-13 | DRG: 433 | Disposition: A | Discharge status: Home / Self Care | Payer: MEDICAID | Attending: Internal Medicine | Admitting: Internal Medicine

## 2012-08-06 DIAGNOSIS — I10 Essential (primary) hypertension: Secondary | ICD-10-CM | POA: Diagnosis present

## 2012-08-06 DIAGNOSIS — D7589 Other specified diseases of blood and blood-forming organs: Secondary | ICD-10-CM | POA: Diagnosis present

## 2012-08-06 DIAGNOSIS — K810 Acute cholecystitis: Secondary | ICD-10-CM | POA: Diagnosis present

## 2012-08-06 DIAGNOSIS — R911 Solitary pulmonary nodule: Secondary | ICD-10-CM | POA: Diagnosis present

## 2012-08-06 DIAGNOSIS — Z79899 Other long term (current) drug therapy: Secondary | ICD-10-CM

## 2012-08-06 DIAGNOSIS — B9789 Other viral agents as the cause of diseases classified elsewhere: Secondary | ICD-10-CM | POA: Diagnosis present

## 2012-08-06 DIAGNOSIS — J45909 Unspecified asthma, uncomplicated: Secondary | ICD-10-CM | POA: Diagnosis present

## 2012-08-06 DIAGNOSIS — B2790 Infectious mononucleosis, unspecified without complication: Secondary | ICD-10-CM | POA: Diagnosis present

## 2012-08-06 DIAGNOSIS — K2920 Alcoholic gastritis without bleeding: Secondary | ICD-10-CM | POA: Diagnosis present

## 2012-08-06 DIAGNOSIS — D696 Thrombocytopenia, unspecified: Secondary | ICD-10-CM | POA: Diagnosis present

## 2012-08-06 DIAGNOSIS — R7402 Elevation of levels of lactic acid dehydrogenase (LDH): Secondary | ICD-10-CM | POA: Diagnosis present

## 2012-08-06 DIAGNOSIS — I951 Orthostatic hypotension: Secondary | ICD-10-CM | POA: Diagnosis present

## 2012-08-06 DIAGNOSIS — F410 Panic disorder [episodic paroxysmal anxiety] without agoraphobia: Secondary | ICD-10-CM | POA: Diagnosis present

## 2012-08-06 DIAGNOSIS — R7401 Elevation of levels of liver transaminase levels: Secondary | ICD-10-CM | POA: Diagnosis present

## 2012-08-06 DIAGNOSIS — F10239 Alcohol dependence with withdrawal, unspecified: Secondary | ICD-10-CM | POA: Diagnosis present

## 2012-08-06 DIAGNOSIS — E039 Hypothyroidism, unspecified: Secondary | ICD-10-CM | POA: Diagnosis present

## 2012-08-06 DIAGNOSIS — R160 Hepatomegaly, not elsewhere classified: Secondary | ICD-10-CM | POA: Diagnosis present

## 2012-08-06 DIAGNOSIS — K760 Fatty (change of) liver, not elsewhere classified: Secondary | ICD-10-CM

## 2012-08-06 DIAGNOSIS — Z6833 Body mass index (BMI) 33.0-33.9, adult: Secondary | ICD-10-CM

## 2012-08-06 DIAGNOSIS — R000 Tachycardia, unspecified: Secondary | ICD-10-CM | POA: Diagnosis present

## 2012-08-06 DIAGNOSIS — E876 Hypokalemia: Secondary | ICD-10-CM

## 2012-08-06 DIAGNOSIS — Z880 Allergy status to penicillin: Secondary | ICD-10-CM

## 2012-08-06 DIAGNOSIS — K7010 Alcoholic hepatitis without ascites: Secondary | ICD-10-CM

## 2012-08-06 DIAGNOSIS — R1010 Upper abdominal pain, unspecified: Secondary | ICD-10-CM

## 2012-08-06 DIAGNOSIS — E781 Pure hyperglyceridemia: Secondary | ICD-10-CM | POA: Diagnosis present

## 2012-08-06 DIAGNOSIS — E785 Hyperlipidemia, unspecified: Secondary | ICD-10-CM | POA: Diagnosis present

## 2012-08-06 DIAGNOSIS — R197 Diarrhea, unspecified: Secondary | ICD-10-CM

## 2012-08-06 DIAGNOSIS — K700 Alcoholic fatty liver: Secondary | ICD-10-CM | POA: Diagnosis present

## 2012-08-06 DIAGNOSIS — D72819 Decreased white blood cell count, unspecified: Secondary | ICD-10-CM | POA: Diagnosis present

## 2012-08-06 DIAGNOSIS — F10939 Alcohol use, unspecified with withdrawal, unspecified: Secondary | ICD-10-CM | POA: Diagnosis present

## 2012-08-06 DIAGNOSIS — Z87898 Personal history of other specified conditions: Secondary | ICD-10-CM | POA: Diagnosis present

## 2012-08-06 DIAGNOSIS — F1011 Alcohol abuse, in remission: Secondary | ICD-10-CM | POA: Diagnosis present

## 2012-08-06 DIAGNOSIS — R7989 Other specified abnormal findings of blood chemistry: Secondary | ICD-10-CM

## 2012-08-06 DIAGNOSIS — F1020 Alcohol dependence, uncomplicated: Secondary | ICD-10-CM | POA: Diagnosis present

## 2012-08-06 DIAGNOSIS — E669 Obesity, unspecified: Secondary | ICD-10-CM | POA: Diagnosis present

## 2012-08-06 DIAGNOSIS — F1010 Alcohol abuse, uncomplicated: Secondary | ICD-10-CM | POA: Diagnosis present

## 2012-08-06 DIAGNOSIS — K219 Gastro-esophageal reflux disease without esophagitis: Secondary | ICD-10-CM

## 2012-08-06 DIAGNOSIS — E860 Dehydration: Secondary | ICD-10-CM | POA: Diagnosis present

## 2012-08-06 DIAGNOSIS — J209 Acute bronchitis, unspecified: Secondary | ICD-10-CM | POA: Diagnosis present

## 2012-08-06 DIAGNOSIS — I519 Heart disease, unspecified: Secondary | ICD-10-CM | POA: Diagnosis present

## 2012-08-06 DIAGNOSIS — R161 Splenomegaly, not elsewhere classified: Secondary | ICD-10-CM | POA: Diagnosis present

## 2012-08-06 LAB — CBC
HCT: 42.9 % (ref 36.0–46.0)
Hemoglobin: 15.2 g/dL — ABNORMAL HIGH (ref 12.0–15.0)
MCH: 36.1 pg — ABNORMAL HIGH (ref 26.0–34.0)
MCHC: 35.4 g/dL (ref 30.0–36.0)
MCV: 101.9 fL — ABNORMAL HIGH (ref 78.0–100.0)
Platelets: 167 10*3/uL (ref 150–400)
RBC: 4.21 MIL/uL (ref 3.87–5.11)
RDW: 13.2 % (ref 11.5–15.5)
WBC: 4.8 10*3/uL (ref 4.0–10.5)

## 2012-08-06 LAB — URINALYSIS, ROUTINE W REFLEX MICROSCOPIC
Glucose, UA: NEGATIVE mg/dL
Hgb urine dipstick: NEGATIVE
Ketones, ur: NEGATIVE mg/dL
Nitrite: NEGATIVE
Protein, ur: NEGATIVE mg/dL
Specific Gravity, Urine: 1.022 (ref 1.005–1.030)
Urobilinogen, UA: 1 mg/dL (ref 0.0–1.0)
pH: 5.5 (ref 5.0–8.0)

## 2012-08-06 LAB — BASIC METABOLIC PANEL
BUN: 6 mg/dL (ref 6–23)
CO2: 23 mEq/L (ref 19–32)
Calcium: 8.6 mg/dL (ref 8.4–10.5)
Chloride: 93 mEq/L — ABNORMAL LOW (ref 96–112)
Creatinine, Ser: 0.83 mg/dL (ref 0.50–1.10)
GFR calc Af Amer: >90 mL/min (ref >90)
GFR calc non Af Amer: 90 mL/min — ABNORMAL LOW (ref >90)
Glucose, Bld: 76 mg/dL (ref 70–99)
Potassium: 3.3 mEq/L — ABNORMAL LOW (ref 3.5–5.1)
Sodium: 131 mEq/L — ABNORMAL LOW (ref 135–145)

## 2012-08-06 LAB — URINE MICROSCOPIC-ADD ON

## 2012-08-06 LAB — HEPATIC FUNCTION PANEL
AST: 161 U/L — ABNORMAL HIGH (ref 0–37)
Albumin: 2.8 g/dL — ABNORMAL LOW (ref 3.5–5.2)
Total Protein: 6.8 g/dL (ref 6.0–8.3)

## 2012-08-06 LAB — PREGNANCY, URINE: Preg Test, Ur: NEGATIVE

## 2012-08-06 LAB — D-DIMER, QUANTITATIVE: D-Dimer, Quant: 2.16 ug/mL-FEU — ABNORMAL HIGH (ref 0.00–0.48)

## 2012-08-06 MED ORDER — POTASSIUM CHLORIDE CRYS ER 20 MEQ PO TBCR
20.0000 meq | EXTENDED_RELEASE_TABLET | Freq: Once | ORAL | Status: AC
  Administered 2012-08-06: 20 meq via ORAL
  Filled 2012-08-06: qty 1

## 2012-08-06 MED ORDER — ONDANSETRON HCL 4 MG/2ML IJ SOLN
4.0000 mg | Freq: Once | INTRAMUSCULAR | Status: AC
  Administered 2012-08-06: 4 mg via INTRAVENOUS
  Filled 2012-08-06: qty 2

## 2012-08-06 MED ORDER — SODIUM CHLORIDE 0.9 % IV SOLN
Freq: Once | INTRAVENOUS | Status: AC
  Administered 2012-08-06: 23:00:00 via INTRAVENOUS

## 2012-08-06 MED ORDER — SODIUM CHLORIDE 0.9 % IV BOLUS (SEPSIS)
1000.0000 mL | Freq: Once | INTRAVENOUS | Status: AC
  Administered 2012-08-06: 1000 mL via INTRAVENOUS

## 2012-08-06 NOTE — ED Notes (Signed)
D dimer sent to lab by this Clinical research associate

## 2012-08-06 NOTE — ED Provider Notes (Signed)
Pt received from [place], [place].  Pt presented to ED w/ generalized weakness.  Tachycardic and orthostatic on exam.  Likely dehydrated secondary to decreased po intake.  Labs unremarkable.  Has received 1L NS bolus and reports that she is not feeling any better currently.  2nd liter has been initiated.  Current VS are SBP 106 and HR 112.  Lungs clear.  Per prior chart, when patient was evaluated at [facility] 2 days ago for cough, lightheadedness and generalized weakness, her HR was 130 and BP ~same.  She states that her SBP usually runs in low-mid 140s.  She takes metoprolol prn for SVT, but has not taken it recently.   Was prescribed phenergan-codeine as well as allegra D 2 days ago and cough and chest tightness improved.  Only taking the anti-tussive at night.  Will reassess after 2nd liter.  9:42 PM   HR 112 and RR 32 upon entering room again.  Pt reports that she is not feeling any better currently.   No RF for PE, but pt is concerned because her father has h/o DVT and she had unilateral ankle edema earlier this week.  She has also had a sharp pain at LSB, though it is chronic and intermittent x several years.  No chest tenderness, no respiratory distress, no signs of DVT on exam currently.  Will repeat CXR to r/o pna and order a d-dimer as well.  Will continue to hydrate until patient is no longer orthostatic and has urinated.  10:40 PM   VSS.  Patient continues to feel weak and lightheaded.  D-dimer elevated.  CTA chest ordered.  Dr. Silverio Lay recommends troponin as well.  Pt receiving 3rd liter of NS.  11:50 PM    Date: 08/07/2012  Rate: 121  Rhythm: sinus tachycardia  QRS Axis: normal  Intervals: normal  ST/T Wave abnormalities: nonspecific T wave changes  Conduction Disutrbances:none  Narrative Interpretation:   Old EKG Reviewed: unchanged  Pt continues to be symptomatic and VS unchanged.  Suspect alcohol withdrawal.  Last liquor drink 7 days ago, and was drinking on a daily basis prior.  Was trying to  quit d/t concern for elevated liver enzymes.  Unsure if current symptoms are related.  [facility] consulted for admission.  Pt to receive IV ativan.  2:42 AM   Otilio Miu, PA-C 08/07/12 [phone]

## 2012-08-06 NOTE — ED Provider Notes (Signed)
History     CSN: [id number]  Arrival date & time 08/06/12  1707   First MD Initiated Contact with Patient 08/06/12 1731      Chief Complaint  Patient presents with  . Weakness    (Consider location/radiation/quality/duration/timing/severity/associated sxs/prior treatment) Patient is a 37 y.o. female presenting with weakness. The history is provided by the patient. No language interpreter was used.  Weakness Associated symptoms include arthralgias, coughing, fatigue, a fever, myalgias, a sore throat and weakness. Pertinent negatives include no chest pain or chills.   Pt is a 37yo female hx of SVT, anxiety, HTN, and asthma c/o weakness and dizziness for 1wk associated with myalgias, arthralgias, and fever.  Has taken ibuprofen and tylenol for fever but once she stops taking ibuprofen and tylenol fever spikes to 102 again.  Reports having been seen at [facility] on Wednesday and dx with sinusitis/uri.  Was advised to take Zyrtec D.  Believes this has lowered her BP.  Normally runs in 140s/80s but now in low 100s/60s. States she feels lightheaded when she gets up to walk.  Reports having not been drinking or eating much for past 4 days stating it makes her gag.   Has noticed increased palpitations over past month for which she normally takes metoprolol but has not taken it in fear of dropping BP too low.  Denies n/v/d, or dysuria but reports increased frequency in urination.  Pt is former heavy drinker, states she stopped 1wk ago.    Past Medical History  Diagnosis Date  . SVT (supraventricular tachycardia)   . Hiatal hernia   . Anxiety   . Acid reflux   . Dyspepsia 2004    Dx w/ PUD but no EGD, [place], [place]   . Hypertriglyceridemia   . Hypothyroid   . Iritis     frequent  . Hypertension   . Asthma   . Panic attacks     Past Surgical History  Procedure Laterality Date  . Esophagogastroduodenoscopy  11/06/2011    [name]: MILD Esophagitis/PATENT ESOPHAGEAL Stricture/  Moderate gastritis.  Bx no.hpylori or celiac, +gastritis    Family History  Problem Relation Age of Onset  . Stomach cancer Paternal Grandfather   . Breast cancer Maternal Grandmother     History  Substance Use Topics  . Smoking status: Never Smoker   . Smokeless tobacco: Not on file  . Alcohol Use: No     Comment: pt reports she quit x1 week.    OB History   Grav Para Term Preterm Abortions TAB SAB Ect Mult Living                  Review of Systems  Constitutional: Positive for fever and fatigue. Negative for chills.  HENT: Positive for sore throat.   Respiratory: Positive for cough. Negative for chest tightness and shortness of breath.   Cardiovascular: Positive for palpitations. Negative for chest pain.  Genitourinary: Positive for frequency. Negative for dysuria.  Musculoskeletal: Positive for myalgias, back pain and arthralgias.  Neurological: Positive for weakness and light-headedness.  All other systems reviewed and are negative.    Allergies  Penicillins  Home Medications   No current outpatient prescriptions on file.  BP 103/73  Pulse 137  Temp(Src) 99.2 F (37.3 C) (Oral)  Resp 20  Ht 5\' 4"  (1.626 m)  Wt 197 lb 1.6 oz (89.404 kg)  BMI 33.82 kg/m2  SpO2 99%  LMP 03/06/2012  Physical Exam  Nursing note and vitals reviewed. Constitutional: She appears  well-developed and well-nourished. No distress.  HENT:  Head: Normocephalic and atraumatic.  Eyes: Conjunctivae are normal. No scleral icterus.  Neck: Normal range of motion.  Cardiovascular: Regular rhythm and normal heart sounds.  Tachycardia present.   Pulmonary/Chest: Effort normal and breath sounds normal. No respiratory distress. She has no wheezes. She has no rales. She exhibits no tenderness.  Abdominal: Soft. Bowel sounds are normal. She exhibits no distension and no mass. There is no tenderness. There is no rebound and no guarding.  Musculoskeletal: Normal range of motion. She exhibits tenderness ( across lower  back).  Neurological: She is alert.  Skin: Skin is warm and dry. She is not diaphoretic.  Psychiatric: She has a normal mood and affect. Her behavior is normal.    ED Course  Procedures (including critical care time)  Labs Reviewed  CBC - Abnormal; Notable for the following:    Hemoglobin 15.2 (*)    MCV 101.9 (*)    MCH 36.1 (*)    All other components within normal limits  BASIC METABOLIC PANEL - Abnormal; Notable for the following:    Sodium 131 (*)    Potassium 3.3 (*)    Chloride 93 (*)    GFR calc non Af Amer 90 (*)    All other components within normal limits  URINALYSIS, ROUTINE W REFLEX MICROSCOPIC - Abnormal; Notable for the following:    Color, Urine AMBER (*)    APPearance CLOUDY (*)    Bilirubin Urine SMALL (*)    Leukocytes, UA SMALL (*)    All other components within normal limits  HEPATIC FUNCTION PANEL - Abnormal; Notable for the following:    Albumin 2.8 (*)    AST 161 (*)    ALT 85 (*)    Alkaline Phosphatase 118 (*)    Total Bilirubin 1.4 (*)    Bilirubin, Direct 0.7 (*)    All other components within normal limits  D-DIMER, QUANTITATIVE - Abnormal; Notable for the following:    D-Dimer, Quant 2.16 (*)    All other components within normal limits  PHOSPHORUS - Abnormal; Notable for the following:    Phosphorus 1.7 (*)    All other components within normal limits  COMPREHENSIVE METABOLIC PANEL - Abnormal; Notable for the following:    Sodium 134 (*)    Glucose, Bld 110 (*)    Calcium 6.9 (*)    Total Protein 5.2 (*)    Albumin 2.0 (*)    AST 133 (*)    ALT 64 (*)    Total Bilirubin 1.5 (*)    All other components within normal limits  CBC - Abnormal; Notable for the following:    WBC 3.6 (*)    RBC 3.56 (*)    MCV 102.5 (*)    MCH 35.7 (*)    Platelets 105 (*)    All other components within normal limits  PREGNANCY, URINE  URINE MICROSCOPIC-ADD ON  MAGNESIUM  PROTIME-INR  TSH  POCT I-STAT TROPONIN I   Dg Chest 2 View  08/06/2012   *RADIOLOGY REPORT*  Clinical Data: Shortness of breath, cough  CHEST - 2 VIEW  Comparison: 08/04/2012  Findings: Mild bibasilar opacities and peribronchial thickening. No pleural effusion or pneumothorax.  Cardiomediastinal contours within normal range.  No acute osseous finding.  IMPRESSION: Mild bibasilar opacities and peribronchial markings, nonspecific; atelectasis, early infiltrate, or atypical/viral pneumonia.   Original Report Authenticated By: Jearld Lesch, M.D.    Ct Angio Chest Pe W/cm &/or Wo  Cm  08/07/2012  *RADIOLOGY REPORT*  Clinical Data: Elevated D-dimer, cough  CT ANGIOGRAPHY CHEST  Technique:  Multidetector CT imaging of the chest using the standard protocol during bolus administration of intravenous contrast. Multiplanar reconstructed images including MIPs were obtained and reviewed to evaluate the vascular anatomy.  Contrast: OMNIPAQUE IOHEXOL 350 MG/ML SOLN  Comparison: Chest x-ray earlier today; relatively recent prior chest CT July 03, 2012  Findings:  Mediastinum: Unremarkable CT appearance of the thyroid gland.  No suspicious mediastinal or hilar adenopathy.  No soft tissue mediastinal mass.  The thoracic esophagus is unremarkable.  Heart/Vascular: Adequate opacification of the pulmonary arteries to the segmental level.  No central filling defect to suggest acute pulmonary embolus.  Conventional three-vessel arch anatomy.  No aneurysmal dilatation or acute dissection.  Normal-caliber main and central pulmonary arteries.  Heart is within normal limits for size.  No pericardial effusion.  Lungs/Pleura: 3 mm pulmonary nodule in the right middle lobe, unchanged compared to recent prior.  The lungs are otherwise clear.  Upper Abdomen: Marked hypoattenuation of the hepatic parenchyma consistent with hepatic steatosis.  Although incompletely imaged, the spleen appears enlarged.  Otherwise, the visualized upper abdomen is unremarkable.  Bones: No acute fracture or aggressive appearing  lytic or blastic osseous lesion.  IMPRESSION:  1.  Negative for pulmonary embolism, pneumonia or other acute cardiopulmonary process.  2.  Stable 3 mm nonspecific pulmonary nodule the right middle lobe compared to 07/03/2012.  If the patient is at high risk for bronchogenic carcinoma, follow-up chest CT at 1 year is recommended.  If the patient is at low risk, no follow-up is needed.  This recommendation follows the consensus statement: Guidelines for Management of Small Pulmonary Nodules Detected on CT Scans:  A Statement from the Fleischner Society as published in Radiology 2005; 237:395-400.  3.  Persistent marked hepatic steatosis  4.  Although incompletely evaluated, the spleen appears enlarged. Query history of splenomegaly   Original Report Authenticated By: Malachy Moan, M.D.      1. Tachycardia   2. Alcohol abuse   3. Dehydration   4. GERD (gastroesophageal reflux disease)       MDM  Pt c/o body aches associated with weakness and dizziness.  Pt reports having not eaten or drank much for past 4 days due to gagging sensation when she does eat or drink.  Also reports increased frequency of palpitations. Vitas: tachycardic and hypotensive (per pt's norm) PE: pt does not appear agitated or in acute distress. Benign exam except for tachycardic. Mild ortho stasis.  Labs: cbc-noncontributory, BMP-mild hypokalemia.  Will start fluids and give potassium.    Waiting on UA and hepatic panel     Plan is to rehydrate pt with IV fluids and normalize vitals.  Likely discharge pt home and f/u with PCP.    Signing out to [employer].        Junius Finner, PA-C 08/07/12 1019

## 2012-08-06 NOTE — ED Notes (Signed)
She states she has had intermittant fever with cough/weakness since this Tues.  Seen at [facility] this Wed./dx with sinusitis/uri--today "I'm getting worse and I feel like I'm gonna pass out sometimes".  She is in no distress.  She states she has hx of PSVT and is on prn metoprolol for same, however, she hasn't taken it recently for fear of lowering her b/p.

## 2012-08-07 ENCOUNTER — Encounter (HOSPITAL_COMMUNITY): Payer: Self-pay | Admitting: Internal Medicine

## 2012-08-07 DIAGNOSIS — E860 Dehydration: Secondary | ICD-10-CM | POA: Diagnosis present

## 2012-08-07 DIAGNOSIS — F1010 Alcohol abuse, uncomplicated: Secondary | ICD-10-CM

## 2012-08-07 DIAGNOSIS — K219 Gastro-esophageal reflux disease without esophagitis: Secondary | ICD-10-CM

## 2012-08-07 DIAGNOSIS — R000 Tachycardia, unspecified: Secondary | ICD-10-CM | POA: Diagnosis present

## 2012-08-07 LAB — CBC
HCT: 36.5 % (ref 36.0–46.0)
MCH: 35.7 pg — ABNORMAL HIGH (ref 26.0–34.0)
MCV: 102.5 fL — ABNORMAL HIGH (ref 78.0–100.0)
Platelets: 105 10*3/uL — ABNORMAL LOW (ref 150–400)
RDW: 13.4 % (ref 11.5–15.5)

## 2012-08-07 LAB — COMPREHENSIVE METABOLIC PANEL
ALT: 64 U/L — ABNORMAL HIGH (ref 0–35)
Alkaline Phosphatase: 90 U/L (ref 39–117)
CO2: 19 mEq/L (ref 19–32)
Chloride: 106 mEq/L (ref 96–112)
GFR calc Af Amer: >90 mL/min (ref >90)
GFR calc non Af Amer: >90 mL/min (ref >90)
Glucose, Bld: 110 mg/dL — ABNORMAL HIGH (ref 70–99)
Potassium: 3.5 mEq/L (ref 3.5–5.1)
Sodium: 134 mEq/L — ABNORMAL LOW (ref 135–145)

## 2012-08-07 MED ORDER — LORAZEPAM 1 MG PO TABS: 1.0000 mg | ORAL_TABLET | Freq: Four times a day (QID) | ORAL | Status: DC | PRN

## 2012-08-07 MED ORDER — ONDANSETRON HCL 4 MG PO TABS
4.0000 mg | ORAL_TABLET | Freq: Four times a day (QID) | ORAL | Status: DC | PRN
  Administered 2012-08-08 – 2012-08-11 (×4): 4 mg via ORAL
  Filled 2012-08-07 (×4): qty 1

## 2012-08-07 MED ORDER — ONDANSETRON HCL 4 MG/2ML IJ SOLN
4.0000 mg | Freq: Four times a day (QID) | INTRAMUSCULAR | Status: DC | PRN
  Administered 2012-08-08 – 2012-08-10 (×3): 4 mg via INTRAVENOUS
  Filled 2012-08-07 (×6): qty 2

## 2012-08-07 MED ORDER — FOLIC ACID 1 MG PO TABS
1.0000 mg | ORAL_TABLET | Freq: Every day | ORAL | Status: DC
  Administered 2012-08-07 – 2012-08-13 (×7): 1 mg via ORAL
  Filled 2012-08-07 (×7): qty 1

## 2012-08-07 MED ORDER — IOHEXOL 350 MG/ML SOLN
100.0000 mL | Freq: Once | INTRAVENOUS | Status: AC | PRN
Start: 2012-08-07 — End: 2012-08-07
  Administered 2012-08-07: 100 mL via INTRAVENOUS

## 2012-08-07 MED ORDER — ACETAMINOPHEN 325 MG PO TABS
650.0000 mg | ORAL_TABLET | Freq: Four times a day (QID) | ORAL | Status: DC | PRN
  Administered 2012-08-07 – 2012-08-08 (×2): 650 mg via ORAL
  Filled 2012-08-07 (×2): qty 2

## 2012-08-07 MED ORDER — OXYCODONE HCL 5 MG PO TABS
5.0000 mg | ORAL_TABLET | Freq: Four times a day (QID) | ORAL | Status: DC | PRN
  Administered 2012-08-07 – 2012-08-13 (×14): 5 mg via ORAL
  Filled 2012-08-07 (×13): qty 1

## 2012-08-07 MED ORDER — THIAMINE HCL 100 MG/ML IJ SOLN
100.0000 mg | Freq: Once | INTRAMUSCULAR | Status: AC
  Administered 2012-08-07: 100 mg via INTRAVENOUS
  Filled 2012-08-07 (×2): qty 1

## 2012-08-07 MED ORDER — PANTOPRAZOLE SODIUM 40 MG PO TBEC
40.0000 mg | DELAYED_RELEASE_TABLET | Freq: Every day | ORAL | Status: DC
  Administered 2012-08-07 – 2012-08-10 (×4): 40 mg via ORAL
  Filled 2012-08-07 (×6): qty 1

## 2012-08-07 MED ORDER — LORAZEPAM 2 MG/ML IJ SOLN
1.0000 mg | Freq: Once | INTRAMUSCULAR | Status: AC
  Administered 2012-08-07: 03:00:00 via INTRAVENOUS
  Filled 2012-08-07: qty 1

## 2012-08-07 MED ORDER — ALBUTEROL SULFATE HFA 108 (90 BASE) MCG/ACT IN AERS
2.0000 | INHALATION_SPRAY | Freq: Four times a day (QID) | RESPIRATORY_TRACT | Status: DC | PRN
  Filled 2012-08-07: qty 6.7

## 2012-08-07 MED ORDER — METOPROLOL TARTRATE 12.5 MG HALF TABLET
12.5000 mg | ORAL_TABLET | Freq: Two times a day (BID) | ORAL | Status: DC
  Administered 2012-08-07 – 2012-08-09 (×5): 12.5 mg via ORAL
  Filled 2012-08-07 (×6): qty 1

## 2012-08-07 MED ORDER — GUAIFENESIN 100 MG/5ML PO SOLN
5.0000 mL | Freq: Four times a day (QID) | ORAL | Status: DC | PRN
  Administered 2012-08-07 – 2012-08-09 (×6): 100 mg via ORAL
  Filled 2012-08-07 (×6): qty 10

## 2012-08-07 MED ORDER — SODIUM CHLORIDE 0.9 % IV SOLN: Freq: Once | INTRAVENOUS | Status: DC

## 2012-08-07 MED ORDER — DOCUSATE SODIUM 100 MG PO CAPS
100.0000 mg | ORAL_CAPSULE | Freq: Two times a day (BID) | ORAL | Status: DC
  Administered 2012-08-07 – 2012-08-13 (×12): 100 mg via ORAL
  Filled 2012-08-07 (×14): qty 1

## 2012-08-07 MED ORDER — VITAMIN B-1 100 MG PO TABS
100.0000 mg | ORAL_TABLET | Freq: Every day | ORAL | Status: DC
  Administered 2012-08-07 – 2012-08-13 (×7): 100 mg via ORAL
  Filled 2012-08-07 (×7): qty 1

## 2012-08-07 MED ORDER — SODIUM CHLORIDE 0.9 % IJ SOLN
3.0000 mL | Freq: Two times a day (BID) | INTRAMUSCULAR | Status: DC
  Administered 2012-08-07 – 2012-08-12 (×7): 3 mL via INTRAVENOUS

## 2012-08-07 MED ORDER — ACETAMINOPHEN 650 MG RE SUPP: 650.0000 mg | Freq: Four times a day (QID) | RECTAL | Status: DC | PRN

## 2012-08-07 MED ORDER — SODIUM CHLORIDE 0.9 % IV SOLN
INTRAVENOUS | Status: AC
  Administered 2012-08-07: 05:00:00 via INTRAVENOUS

## 2012-08-07 NOTE — Progress Notes (Signed)
[facility] PROGRESS NOTE  Kara Stewart ZOX:[id number] DOB: Dec 10, 1975 DOA: 08/06/2012 PCP: No PCP Per Patient  Brief narrative 37 year old female patient with history of alcohol dependence, SVT, anxiety, GERD, hypothyroidism, HTN, asthma, esophageal it is and gastritis by EGD 11/2011, recent alcohol detox, was admitted on 08/07/12 with complaints of 3-4 day history of fevers, cough with clear sputum associated chest pain, dizzy and lightheaded like she was going to pass out on standing or ambulating, low back pain and? Dysuria. In ED, blood pressures were soft but not truly orthostatic, tachycardic, sodium 131, potassium 3.3, AST 161, ALT 85, total bilirubin 1.4, MCV 102, UA not convincing for UTI and CTA chest was negative for PE, pneumonia or other acute cardiopulmonary process. Hospitalist admission was requested.   Assessment/Plan: 1. Dehydration: Secondary to poor oral intake. This is likely contributing to her soft blood pressures and tachycardia. Continue IV fluids for additional 24 hours and encourage liberal by mouth intake. 2. Tachycardia: Possibly secondary to dehydration, rule out alcohol withdrawal. Telemetry shows sinus tachycardia in the 110-improving compared to admission. TSH followup. 3. GERD: Continue PPIs. 4. Alcohol dependence: Recently completed detox but claims to have a single beer 3 days back. Currently no overt features of withdrawal. Monitor closely for withdrawal and continue Ativan when necessary. Counseled again regarding cessation. 5. Hypothyroidism 6. Hypertension: Controlled 7. Cough/possible acute viral bronchitis: CT chest negative for acute findings. Symptomatic treatment. No antibiotics at this time. 8. Abnormal LFTs/transaminitis/? Alcoholic hepatitis: These LFTs have been abnormal even a year ago at which time HBsAg and hepatitis C antibody were negative. Likely secondary to continued alcohol abuse. Denies GI symptoms. Followup outpatient. 9. Leukopenia,  thrombocytopenia and macrocytosis: Likely secondary to alcohol abuse. Follow CBC in a.m. 10. Right middle lobe 3 mm lung nodule on CT chest: Denies ever smoking. Outpatient followup as deemed necessary.  Code Status: Full Family Communication:  discussed with patient and family/friend at bedside. Disposition Plan:  home when medically stable.   Consultants:  None  Procedures:  None  Antibiotics:  None   HPI/Subjective:  Still feels slightly dizzy and lightheaded on sitting or standing, intermittent cough with clear sputum and associated chest pain, low back pain,? Dysuria. Never smoked.  Objective: Filed Vitals:   08/07/12 0439 08/07/12 0652 08/07/12 0653 08/07/12 0655  BP: 122/70 100/58 113/72 103/73  Pulse: 114 119 130 137  Temp:      TempSrc:      Resp: 20     Height: 5\' 4"  (1.626 m)     Weight: 89.404 kg (197 lb 1.6 oz)     SpO2: 99%      No intake or output data in the 24 hours ending 08/07/12 1012 Filed Weights   08/07/12 0439  Weight: 89.404 kg (197 lb 1.6 oz)    Exam:  Patient was examined with her female nurse in the room.   General exam: Comfortable.  Respiratory system: Clear. No increased work of breathing.  Cardiovascular system: S1 & S2 heard, RRR. No JVD, murmurs, gallops, clicks or pedal edema. telemetry: Sinus tachycardia in the 110s.   Gastrointestinal system: Abdomen is nondistended, soft and nontender. Normal bowel sounds heard.  Central nervous system: Alert and oriented. No focal neurological deficits.  Extremities: Symmetric 5 x 5 power. Not tremulous.    Data Reviewed: Basic Metabolic Panel:  Recent Labs Lab 08/06/12 1845 08/07/12 0625  NA 131* 134*  K 3.3* 3.5  CL 93* 106  CO2 23 19  GLUCOSE 76 110*  BUN  6 6  CREATININE 0.83 0.74  CALCIUM 8.6 6.9*  MG  --  1.5  PHOS  --  1.7*   Liver Function Tests:  Recent Labs Lab 08/06/12 1845 08/07/12 0625  AST 161* 133*  ALT 85* 64*  ALKPHOS 118* 90  BILITOT 1.4* 1.5*   PROT 6.8 5.2*  ALBUMIN 2.8* 2.0*   No results found for this basename: LIPASE, AMYLASE,  in the last 168 hours No results found for this basename: AMMONIA,  in the last 168 hours CBC:  Recent Labs Lab 08/06/12 1845 08/07/12 0706  WBC 4.8 3.6*  HGB 15.2* 12.7  HCT 42.9 36.5  MCV 101.9* 102.5*  PLT 167 105*   Cardiac Enzymes: No results found for this basename: CKTOTAL, CKMB, CKMBINDEX, TROPONINI,  in the last 168 hours BNP (last 3 results) No results found for this basename: PROBNP,  in the last 8760 hours CBG: No results found for this basename: GLUCAP,  in the last 168 hours  No results found for this or any previous visit (from the past 240 hour(s)).   Studies: Dg Chest 2 View  08/06/2012  *RADIOLOGY REPORT*  Clinical Data: Shortness of breath, cough  CHEST - 2 VIEW  Comparison: 08/04/2012  Findings: Mild bibasilar opacities and peribronchial thickening. No pleural effusion or pneumothorax.  Cardiomediastinal contours within normal range.  No acute osseous finding.  IMPRESSION: Mild bibasilar opacities and peribronchial markings, nonspecific; atelectasis, early infiltrate, or atypical/viral pneumonia.   Original Report Authenticated By: Jearld Lesch, M.D.    Ct Angio Chest Pe W/cm &/or Wo Cm  08/07/2012  *RADIOLOGY REPORT*  Clinical Data: Elevated D-dimer, cough  CT ANGIOGRAPHY CHEST  Technique:  Multidetector CT imaging of the chest using the standard protocol during bolus administration of intravenous contrast. Multiplanar reconstructed images including MIPs were obtained and reviewed to evaluate the vascular anatomy.  Contrast: OMNIPAQUE IOHEXOL 350 MG/ML SOLN  Comparison: Chest x-ray earlier today; relatively recent prior chest CT July 03, 2012  Findings:  Mediastinum: Unremarkable CT appearance of the thyroid gland.  No suspicious mediastinal or hilar adenopathy.  No soft tissue mediastinal mass.  The thoracic esophagus is unremarkable.  Heart/Vascular: Adequate  opacification of the pulmonary arteries to the segmental level.  No central filling defect to suggest acute pulmonary embolus.  Conventional three-vessel arch anatomy.  No aneurysmal dilatation or acute dissection.  Normal-caliber main and central pulmonary arteries.  Heart is within normal limits for size.  No pericardial effusion.  Lungs/Pleura: 3 mm pulmonary nodule in the right middle lobe, unchanged compared to recent prior.  The lungs are otherwise clear.  Upper Abdomen: Marked hypoattenuation of the hepatic parenchyma consistent with hepatic steatosis.  Although incompletely imaged, the spleen appears enlarged.  Otherwise, the visualized upper abdomen is unremarkable.  Bones: No acute fracture or aggressive appearing lytic or blastic osseous lesion.  IMPRESSION:  1.  Negative for pulmonary embolism, pneumonia or other acute cardiopulmonary process.  2.  Stable 3 mm nonspecific pulmonary nodule the right middle lobe compared to 07/03/2012.  If the patient is at high risk for bronchogenic carcinoma, follow-up chest CT at 1 year is recommended.  If the patient is at low risk, no follow-up is needed.  This recommendation follows the consensus statement: Guidelines for Management of Small Pulmonary Nodules Detected on CT Scans:  A Statement from the Fleischner Society as published in Radiology 2005; 237:395-400.  3.  Persistent marked hepatic steatosis  4.  Although incompletely evaluated, the spleen appears enlarged. Query  history of splenomegaly   Original Report Authenticated By: Malachy Moan, M.D.      Additional labs:   Scheduled Meds: . sodium chloride   Intravenous Once  . docusate sodium  100 mg Oral BID  . folic acid  1 mg Oral Daily  . pantoprazole  40 mg Oral Daily  . sodium chloride  3 mL Intravenous Q12H  . thiamine  100 mg Oral Daily   Continuous Infusions: . sodium chloride 150 mL/hr at 08/07/12 0444    Active Problems:   GERD (gastroesophageal reflux disease)   Alcohol  abuse   Dehydration   Tachycardia    Time spent: 35 minutes.    [facility]  [facility] Pager [phone].   If 8PM-8AM, please contact night-coverage at [email or web address], password [facility] 08/07/2012, 10:12 AM  LOS: 1 day

## 2012-08-07 NOTE — H&P (Signed)
PCP: none  Cardiology Tresa Endo  Chief Complaint:    HPI: Kara Stewart is a 37 y.o. female   has a past medical history of SVT (supraventricular tachycardia); Hiatal hernia; Anxiety; Acid reflux; Dyspepsia (2004); Hypertriglyceridemia; Hypothyroid; Iritis; Hypertension; Asthma; and Panic attacks.   Presented with  She has hx of EtoH abuse but 1 week ago she stopped and went through withdrawal using librium since then she have had 1 beer 3 days ago. She reports cough and low grade fevers. She reports feeling lightheaded and very tired.  She have had some joint pain as well.  She has been seen at [facility] few days ago and was diagnosed with sinusitis she was started on allegra and phenergan/codein but still has not had much improvement.  She presented to [facility] ED and was given 3L of IVF. Her HR initially was in 130's but now down to 110's. She still feels lightheaded. When she ambulates she feels week. She endorses some palpitations. States she have not had as much urine output as usual but have started to urinate now still very concentrated. Hospitalist was called for an admission given persistent orthostatis.   Review of Systems:   Pertinent positives include: Fevers, chills, fatigue,  productive cough,   joint pain   Constitutional:  No weight loss, night sweats, weight loss  HEENT:  No headaches, Difficulty swallowing,Tooth/dental problems,Sore throat,  No sneezing, itching, ear ache, post nasal drip,  Cardio-vascular:  No chest pain, Orthopnea, PND, anasarca, dizziness, palpitations.no Bilateral lower extremity swelling  GI:  No heartburn, indigestion, abdominal pain, nausea, vomiting, diarrhea, change in bowel habits, loss of appetite, melena, blood in stool, hematemesis Resp:  no shortness of breath at rest. No dyspnea on exertion, No excess mucus, no No non-productive cough, No coughing up of blood.No change in color of mucus.No wheezing. Skin:  no rash or lesions. No jaundice GU:  no  dysuria, change in color of urine, no urgency or frequency. No straining to urinate.  No flank pain.  Musculoskeletal:  [place] no joint swelling. No decreased range of motion. No back pain.  Psych:  No change in mood or affect. No depression or anxiety. No memory loss.  Neuro: no localizing neurological complaints, no tingling, no weakness, no double vision, no gait abnormality, no slurred speech, no confusion  Otherwise ROS are negative except for above, 10 systems were reviewed  Past Medical History: Past Medical History  Diagnosis Date  . SVT (supraventricular tachycardia)   . Hiatal hernia   . Anxiety   . Acid reflux   . Dyspepsia 2004    Dx w/ PUD but no EGD, [place], [place]   . Hypertriglyceridemia   . Hypothyroid   . Iritis     frequent  . Hypertension   . Asthma   . Panic attacks    Past Surgical History  Procedure Laterality Date  . Esophagogastroduodenoscopy  11/06/2011    [name]: MILD Esophagitis/PATENT ESOPHAGEAL Stricture/  Moderate gastritis. Bx no.hpylori or celiac, +gastritis     Medications: Prior to Admission medications   Medication Sig Start Date End Date Taking? Authorizing Provider  albuterol (PROVENTIL HFA;VENTOLIN HFA) 108 (90 BASE) MCG/ACT inhaler Inhale 2 puffs into the lungs every 6 (six) hours as needed for wheezing or shortness of breath.   Yes Historical Provider, MD  Aspirin Effervescent (ALKA-SELTZER PO) Take 1 tablet by mouth as needed (stomace pain).   Yes Historical Provider, MD  chlordiazePOXIDE (LIBRIUM) 25 MG capsule 25 mg. Take one to two tablets every  8 hours on day 1, then 1 tablet every six hours on day 2 and day 3. Then 1 tablet three times a day as needed. Do not take with alcohol. 07/21/12  Yes Tiffany Kocher, PA-C  dexlansoprazole (DEXILANT) 60 MG capsule Take 60 mg by mouth daily. 07/21/12  Yes Tiffany Kocher, PA-C  fexofenadine-pseudoephedrine (ALLEGRA-D) 60-120 MG per tablet Take 1 tablet by mouth every 12 (twelve) hours. 08/04/12  Yes Kathie Dike, PA-C  guaiFENesin (ROBITUSSIN) 100 MG/5ML SOLN Take 5 mLs by mouth every 6 (six) hours as needed (cough).   Yes Historical Provider, MD  LORazepam (ATIVAN) 2 MG tablet Take 2 mg by mouth 3 (three) times daily as needed for anxiety.   Yes Historical Provider, MD  metoprolol (LOPRESSOR) 50 MG tablet Take 25 mg by mouth as needed (blood pressure , pulse rate). For high blood pressure and fast heart beat   Yes Historical Provider, MD  Polyvinyl Alcohol-Povidone (REFRESH OP) Place 2 drops into both eyes as needed (dry eyes).   Yes Historical Provider, MD  Probiotic Product (RESTORA PO) Take 1 tablet by mouth daily.   Yes Historical Provider, MD  promethazine-codeine (PHENERGAN WITH CODEINE) 6.25-10 MG/5ML syrup Take 5 mLs by mouth every 6 (six) hours as needed for cough. 08/04/12  Yes Kathie Dike, PA-C    Allergies:   Allergies  Allergen Reactions  . Penicillins Anaphylaxis    Social History:  Ambulatory   independently   Lives at  home   reports that she has never smoked. She does not have any smokeless tobacco history on file. She reports that she does not drink alcohol or use illicit drugs.   Family History: family history includes Breast cancer in her maternal grandmother and Stomach cancer in her paternal grandfather.    Physical Exam: Patient Vitals for the past 24 hrs:  BP Temp Temp src Pulse Resp SpO2 Height  08/07/12 0206 108/62 mmHg - - 133 - - -  08/07/12 0205 108/69 mmHg - - 130 - - -  08/07/12 0203 111/63 mmHg - - 117 - - -  08/06/12 2200 108/63 mmHg - - 115 29 100 % -  08/06/12 2145 - - - 113 21 100 % -  08/06/12 2130 117/62 mmHg - - 117 26 100 % -  08/06/12 2115 - - - 114 27 100 % -  08/06/12 2100 116/72 mmHg - - 113 36 100 % -  08/06/12 1948 103/71 mmHg 99.2 F (37.3 C) Oral 136 18 98 % -  08/06/12 1946 121/78 mmHg - - 131 - - -  08/06/12 1944 113/74 mmHg - - 121 - - -  08/06/12 1748 106/76 mmHg - - 130 18 100 % -  08/06/12 1726 108/78 mmHg 99.7 F  (37.6 C) Oral - - 98 % 5' 4.5" (1.638 m)  08/06/12 1722 - - - - - 98 % -    1. General:  in No Acute distress 2. Psychological: Alert and  Oriented 3. Head/ENT:    Dry Mucous Membranes                          Head Non traumatic, neck supple                          Normal  Dentition 4. SKIN: decreased Skin turgor,  Skin clean Dry and intact no rash 5. Heart: Regular rate and rhythm  no Murmur, Rub or gallop 6. Lungs: Clear to auscultation bilaterally, no wheezes or crackles   7. Abdomen: Soft, mild tenderness, Non distended 8. Lower extremities: no clubbing, cyanosis, or edema 9. Neurologically Grossly intact, moving all 4 extremities equally. No tremor noted 10. MSK: Normal range of motion  body mass index is unknown because there is no weight on file.   Labs on Admission:   Recent Labs  08/06/12 1845  NA 131*  K 3.3*  CL 93*  CO2 23  GLUCOSE 76  BUN 6  CREATININE 0.83  CALCIUM 8.6    Recent Labs  08/06/12 1845  AST 161*  ALT 85*  ALKPHOS 118*  BILITOT 1.4*  PROT 6.8  ALBUMIN 2.8*   No results found for this basename: LIPASE, AMYLASE,  in the last 72 hours  Recent Labs  08/06/12 1845  WBC 4.8  HGB 15.2*  HCT 42.9  MCV 101.9*  PLT 167   No results found for this basename: CKTOTAL, CKMB, CKMBINDEX, TROPONINI,  in the last 72 hours No results found for this basename: TSH, T4TOTAL, FREET3, T3FREE, THYROIDAB,  in the last 72 hours No results found for this basename: VITAMINB12, FOLATE, FERRITIN, TIBC, IRON, RETICCTPCT,  in the last 72 hours No results found for this basename: HGBA1C    The CrCl is unknown because both a height and weight (above a minimum accepted value) are required for this calculation. ABG No results found for this basename: phart, pco2, po2, hco3, tco2, acidbasedef, o2sat     Lab Results  Component Value Date   DDIMER 2.16* 08/06/2012     Other results:  I have pearsonaly reviewed this: ECG REPORT  Rate: 121  Rhythm: ST ST&T  Change: no ischemia  UA no evidence of UTI  Cultures: No results found for this basename: sdes, specrequest, cult, reptstatus       Radiological Exams on Admission: Dg Chest 2 View  08/06/2012  *RADIOLOGY REPORT*  Clinical Data: Shortness of breath, cough  CHEST - 2 VIEW  Comparison: 08/04/2012  Findings: Mild bibasilar opacities and peribronchial thickening. No pleural effusion or pneumothorax.  Cardiomediastinal contours within normal range.  No acute osseous finding.  IMPRESSION: Mild bibasilar opacities and peribronchial markings, nonspecific; atelectasis, early infiltrate, or atypical/viral pneumonia.   Original Report Authenticated By: Jearld Lesch, M.D.    Ct Angio Chest Pe W/cm &/or Wo Cm  08/07/2012  *RADIOLOGY REPORT*  Clinical Data: Elevated D-dimer, cough  CT ANGIOGRAPHY CHEST  Technique:  Multidetector CT imaging of the chest using the standard protocol during bolus administration of intravenous contrast. Multiplanar reconstructed images including MIPs were obtained and reviewed to evaluate the vascular anatomy.  Contrast: OMNIPAQUE IOHEXOL 350 MG/ML SOLN  Comparison: Chest x-ray earlier today; relatively recent prior chest CT July 03, 2012  Findings:  Mediastinum: Unremarkable CT appearance of the thyroid gland.  No suspicious mediastinal or hilar adenopathy.  No soft tissue mediastinal mass.  The thoracic esophagus is unremarkable.  Heart/Vascular: Adequate opacification of the pulmonary arteries to the segmental level.  No central filling defect to suggest acute pulmonary embolus.  Conventional three-vessel arch anatomy.  No aneurysmal dilatation or acute dissection.  Normal-caliber main and central pulmonary arteries.  Heart is within normal limits for size.  No pericardial effusion.  Lungs/Pleura: 3 mm pulmonary nodule in the right middle lobe, unchanged compared to recent prior.  The lungs are otherwise clear.  Upper Abdomen: Marked hypoattenuation of the hepatic parenchyma  consistent with hepatic steatosis.  Although incompletely imaged, the spleen appears enlarged.  Otherwise, the visualized upper abdomen is unremarkable.  Bones: No acute fracture or aggressive appearing lytic or blastic osseous lesion.  IMPRESSION:  1.  Negative for pulmonary embolism, pneumonia or other acute cardiopulmonary process.  2.  Stable 3 mm nonspecific pulmonary nodule the right middle lobe compared to 07/03/2012.  If the patient is at high risk for bronchogenic carcinoma, follow-up chest CT at 1 year is recommended.  If the patient is at low risk, no follow-up is needed.  This recommendation follows the consensus statement: Guidelines for Management of Small Pulmonary Nodules Detected on CT Scans:  A Statement from the Fleischner Society as published in Radiology 2005; 237:395-400.  3.  Persistent marked hepatic steatosis  4.  Although incompletely evaluated, the spleen appears enlarged. Query history of splenomegaly   Original Report Authenticated By: Malachy Moan, M.D.     Chart has been reviewed  Assessment/Plan  37 year old female with known history of alcohol abuse but last alcohol drink 3 days ago due with heavy drinking being discontinued one week ago presents with generalized malaise and orthostasis appears to be dehydrated. Present on Admission:  . Tachycardia - most likely secondary to dehydration appears to be improving with fluids. Will check TSH. At this point for withdrawal is much less likely but will watch for any evidence of tremors or other signs. Ativan when necessary. She has history of SVT on monitor on telemetry.  . Dehydration - IV fluids and check orthostatics  . GERD (gastroesophageal reflux disease) - Protonix  . Alcohol abuse - patient stated that she is no longer actively drinking although have had one beer 3 days ago. Counseled her about danger of restarting   Prophylaxis: SCD  Protonix  CODE STATUS: FULL CODE  Other plan as per orders.  I have spent  a total of 55 min on this admission  Reinhardt Licausi 08/07/2012, 3:20 AM

## 2012-08-07 NOTE — ED Provider Notes (Signed)
Medical screening examination/treatment/procedure(s) were performed by non-physician practitioner and as supervising physician I was immediately available for consultation/collaboration.   David H Yao, MD 08/07/12 1459 

## 2012-08-07 NOTE — ED Notes (Signed)
Pt ambulated to bathroom without assistance 

## 2012-08-07 NOTE — Progress Notes (Addendum)
Patient's HR sustaining in the 140s.  Patient out of bed in bathroom. Patient states she does feel dizzy and is diaphoretic.  Patient was having BM at the time.  Patient vital signs stable, HR has come down to 112 with patient in bed. Dr. Waymon Amato notified; received orders for metoprolol PO and CIWA scale with ativan.  Patient educated to stay in bed for current time period due to escalated HR.  Will carry out new orders and continue to monitor.

## 2012-08-07 NOTE — ED Provider Notes (Signed)
Medical screening examination/treatment/procedure(s) were performed by non-physician practitioner and as supervising physician I was immediately available for consultation/collaboration.   Richardean Canal, MD 08/07/12 [phone]

## 2012-08-07 NOTE — ED Notes (Signed)
Patient transported to CT 

## 2012-08-08 DIAGNOSIS — I951 Orthostatic hypotension: Secondary | ICD-10-CM | POA: Diagnosis not present

## 2012-08-08 DIAGNOSIS — E876 Hypokalemia: Secondary | ICD-10-CM

## 2012-08-08 LAB — COMPREHENSIVE METABOLIC PANEL
AST: 256 U/L — ABNORMAL HIGH (ref 0–37)
Albumin: 2 g/dL — ABNORMAL LOW (ref 3.5–5.2)
BUN: 3 mg/dL — ABNORMAL LOW (ref 6–23)
CO2: 22 mEq/L (ref 19–32)
Calcium: 7.2 mg/dL — ABNORMAL LOW (ref 8.4–10.5)
Creatinine, Ser: 0.8 mg/dL (ref 0.50–1.10)
GFR calc non Af Amer: >90 mL/min (ref >90)

## 2012-08-08 LAB — CBC
Hemoglobin: 11.4 g/dL — ABNORMAL LOW (ref 12.0–15.0)
MCH: 34.8 pg — ABNORMAL HIGH (ref 26.0–34.0)
RBC: 3.28 MIL/uL — ABNORMAL LOW (ref 3.87–5.11)
WBC: 3.1 10*3/uL — ABNORMAL LOW (ref 4.0–10.5)

## 2012-08-08 LAB — MAGNESIUM: Magnesium: 1.6 mg/dL (ref 1.5–2.5)

## 2012-08-08 MED ORDER — POTASSIUM CHLORIDE CRYS ER 20 MEQ PO TBCR
40.0000 meq | EXTENDED_RELEASE_TABLET | Freq: Once | ORAL | Status: AC
  Administered 2012-08-08: 40 meq via ORAL
  Filled 2012-08-08: qty 2

## 2012-08-08 MED ORDER — MAGNESIUM SULFATE 40 MG/ML IJ SOLN
2.0000 g | Freq: Once | INTRAMUSCULAR | Status: AC
  Administered 2012-08-08: 2 g via INTRAVENOUS
  Filled 2012-08-08: qty 50

## 2012-08-08 MED ORDER — LORAZEPAM 0.5 MG PO TABS
0.5000 mg | ORAL_TABLET | Freq: Four times a day (QID) | ORAL | Status: DC | PRN
  Administered 2012-08-09 – 2012-08-12 (×9): 0.5 mg via ORAL
  Filled 2012-08-08 (×11): qty 1

## 2012-08-08 NOTE — Progress Notes (Signed)
Provided support with pt around admission to hospital.  Pt's father and grandfather both have cancer.  Grandfather is hospice 24 hour care.  Pt recently moved from [place] to assist in caring for family members.  Has experienced some stress around finding care for grandfather while she is hospitalized.   Well supported by family and fiance.    Belva Crome MDiv

## 2012-08-08 NOTE — Progress Notes (Signed)
[facility] PROGRESS NOTE  Kara Stewart JXB:[id number] DOB: 1975/04/29 DOA: 08/06/2012 PCP: No PCP Per Patient  Brief narrative 37 year old female patient with history of alcohol dependence, SVT, anxiety, GERD, hypothyroidism, HTN, asthma, esophageal it is and gastritis by EGD 11/2011, recent alcohol detox, was admitted on 08/07/12 with complaints of 3-4 day history of fevers, cough with clear sputum associated chest pain, dizzy and lightheaded like she was going to pass out on standing or ambulating, low back pain and? Dysuria. In ED, blood pressures were soft but not truly orthostatic, tachycardic, sodium 131, potassium 3.3, AST 161, ALT 85, total bilirubin 1.4, MCV 102, UA not convincing for UTI and CTA chest was negative for PE, pneumonia or other acute cardiopulmonary process. Hospitalist admission was requested.   Assessment/Plan: 1. Dehydration: Secondary to poor oral intake. Treated with IV fluids and resolved. Currently looks slightly hypervolemic-trace upper and lower extremity edema. 2. Orthostatic hypotension, symptomatic: BP dropped from 115/65 > 98/45. Unclear etiology. Clinically euvolemic. Check a.m. cortisol and 2-D echo. Low-grade fevers-check blood and urine cultures. 3. Tachycardia: ? secondary to orthostatic hypotension and mostly with activity. Low dose metoprolol started on 5/4. Better. 4. Hypokalemia: Replete when necessary and follow. 5. GERD: Continue PPIs. 6. Alcohol dependence: Recently completed detox but claims to have a single beer 3 days PTA. Currently no overt features of withdrawal. Low CIWA scores < 5. Counseled again regarding cessation. 7. Hypothyroidism: TSH 2.525. 8. Hypertension: Controlled 9. Cough/possible acute viral bronchitis: CT chest negative for acute findings. Symptomatic treatment. No antibiotics at this time. Patient complains of bilateral upper back pain-likely related to coughing spells. 10. Abnormal LFTs/transaminitis/? Alcoholic hepatitis:  These LFTs have been abnormal even a year ago at which time HBsAg and hepatitis C antibody were negative. Likely secondary to continued alcohol abuse. Denies GI symptoms. Followup outpatient. Repeat acute hepatitis panel. Worsened AST and ALT compared to last 2 days-followup again in a.m. and consider RUQ ultrasound if not better or worsening. 11. Leukopenia, thrombocytopenia and macrocytosis: Likely secondary to alcohol abuse. Anemia-? Dilutional. Follow CBC in a.m. No overt bleeding. 12. Right middle lobe 3 mm lung nodule on CT chest: Denies ever smoking. Outpatient followup as deemed necessary.  Code Status: Full Family Communication:  discussed with patient. Disposition Plan:  home when medically stable-not medically ready for DC today.   Consultants:  None  Procedures:  None  Antibiotics:  None   HPI/Subjective: Felt dizzy, nauseous and headache this morning when she was standing in bathroom. Cough with clear sputum. Had some upper back pain bilaterally yesterday-better today.  Objective: Filed Vitals:   08/08/12 0441 08/08/12 0445 08/08/12 0446 08/08/12 0920  BP: 115/65 139/61 98/45 105/67  Pulse: 101 108 146 76  Temp: 99.8 F (37.7 C)     TempSrc: Oral     Resp: 18     Height:      Weight:      SpO2: 96%       Intake/Output Summary (Last 24 hours) at 08/08/12 1444 Last data filed at 08/08/12 0700  Gross per 24 hour  Intake   4560 ml  Output      0 ml  Net   4560 ml   Filed Weights   08/07/12 0439  Weight: 89.404 kg (197 lb 1.6 oz)    Exam:  Patient was examined with her female nurse in the room.   General exam: Comfortable.  Respiratory system: Clear. No increased work of breathing.  Cardiovascular system: S1 & S2 heard, RRR. No  JVD, murmurs, gallops, clicks or pedal edema. telemetry: Sinus tachycardia in the 110s and sinus rhythm.   Gastrointestinal system: Abdomen is nondistended, soft and nontender. Normal bowel sounds heard.  Central nervous  system: Alert and oriented. No focal neurological deficits.  Extremities: Symmetric 5 x 5 power. Not tremulous.    Data Reviewed: Basic Metabolic Panel:  Recent Labs Lab 08/06/12 1845 08/07/12 0625 08/08/12 0420  NA 131* 134* 136  K 3.3* 3.5 3.2*  CL 93* 106 107  CO2 23 19 22   GLUCOSE 76 110* 100*  BUN 6 6 3*  CREATININE 0.83 0.74 0.80  CALCIUM 8.6 6.9* 7.2*  MG  --  1.5 1.6  PHOS  --  1.7*  --    Liver Function Tests:  Recent Labs Lab 08/06/12 1845 08/07/12 0625 08/08/12 0420  AST 161* 133* 256*  ALT 85* 64* 105*  ALKPHOS 118* 90 110  BILITOT 1.4* 1.5* 1.8*  PROT 6.8 5.2* 5.0*  ALBUMIN 2.8* 2.0* 2.0*   No results found for this basename: LIPASE, AMYLASE,  in the last 168 hours No results found for this basename: AMMONIA,  in the last 168 hours CBC:  Recent Labs Lab 08/06/12 1845 08/07/12 0706 08/08/12 0420  WBC 4.8 3.6* 3.1*  HGB 15.2* 12.7 11.4*  HCT 42.9 36.5 34.0*  MCV 101.9* 102.5* 103.7*  PLT 167 105* 87*   Cardiac Enzymes: No results found for this basename: CKTOTAL, CKMB, CKMBINDEX, TROPONINI,  in the last 168 hours BNP (last 3 results) No results found for this basename: PROBNP,  in the last 8760 hours CBG: No results found for this basename: GLUCAP,  in the last 168 hours  No results found for this or any previous visit (from the past 240 hour(s)).   Studies: Dg Chest 2 View  08/06/2012  *RADIOLOGY REPORT*  Clinical Data: Shortness of breath, cough  CHEST - 2 VIEW  Comparison: 08/04/2012  Findings: Mild bibasilar opacities and peribronchial thickening. No pleural effusion or pneumothorax.  Cardiomediastinal contours within normal range.  No acute osseous finding.  IMPRESSION: Mild bibasilar opacities and peribronchial markings, nonspecific; atelectasis, early infiltrate, or atypical/viral pneumonia.   Original Report Authenticated By: Jearld Lesch, M.D.    Ct Angio Chest Pe W/cm &/or Wo Cm  08/07/2012  *RADIOLOGY REPORT*  Clinical Data:  Elevated D-dimer, cough  CT ANGIOGRAPHY CHEST  Technique:  Multidetector CT imaging of the chest using the standard protocol during bolus administration of intravenous contrast. Multiplanar reconstructed images including MIPs were obtained and reviewed to evaluate the vascular anatomy.  Contrast: OMNIPAQUE IOHEXOL 350 MG/ML SOLN  Comparison: Chest x-ray earlier today; relatively recent prior chest CT July 03, 2012  Findings:  Mediastinum: Unremarkable CT appearance of the thyroid gland.  No suspicious mediastinal or hilar adenopathy.  No soft tissue mediastinal mass.  The thoracic esophagus is unremarkable.  Heart/Vascular: Adequate opacification of the pulmonary arteries to the segmental level.  No central filling defect to suggest acute pulmonary embolus.  Conventional three-vessel arch anatomy.  No aneurysmal dilatation or acute dissection.  Normal-caliber main and central pulmonary arteries.  Heart is within normal limits for size.  No pericardial effusion.  Lungs/Pleura: 3 mm pulmonary nodule in the right middle lobe, unchanged compared to recent prior.  The lungs are otherwise clear.  Upper Abdomen: Marked hypoattenuation of the hepatic parenchyma consistent with hepatic steatosis.  Although incompletely imaged, the spleen appears enlarged.  Otherwise, the visualized upper abdomen is unremarkable.  Bones: No acute fracture or aggressive  appearing lytic or blastic osseous lesion.  IMPRESSION:  1.  Negative for pulmonary embolism, pneumonia or other acute cardiopulmonary process.  2.  Stable 3 mm nonspecific pulmonary nodule the right middle lobe compared to 07/03/2012.  If the patient is at high risk for bronchogenic carcinoma, follow-up chest CT at 1 year is recommended.  If the patient is at low risk, no follow-up is needed.  This recommendation follows the consensus statement: Guidelines for Management of Small Pulmonary Nodules Detected on CT Scans:  A Statement from the Fleischner Society as  published in Radiology 2005; 237:395-400.  3.  Persistent marked hepatic steatosis  4.  Although incompletely evaluated, the spleen appears enlarged. Query history of splenomegaly   Original Report Authenticated By: Malachy Moan, M.D.      Additional labs:   Scheduled Meds: . sodium chloride   Intravenous Once  . docusate sodium  100 mg Oral BID  . folic acid  1 mg Oral Daily  . metoprolol tartrate  12.5 mg Oral BID  . pantoprazole  40 mg Oral Daily  . sodium chloride  3 mL Intravenous Q12H  . thiamine  100 mg Oral Daily   Continuous Infusions:    Active Problems:   GERD (gastroesophageal reflux disease)   Alcohol abuse   Dehydration   Tachycardia    Time spent: 45 minutes.    [facility]  [facility] Pager [phone].   If 8PM-8AM, please contact night-coverage at [email or web address], password [facility] 08/08/2012, 2:44 PM  LOS: 2 days

## 2012-08-09 ENCOUNTER — Inpatient Hospital Stay (HOSPITAL_COMMUNITY): Payer: MEDICAID

## 2012-08-09 DIAGNOSIS — R7989 Other specified abnormal findings of blood chemistry: Secondary | ICD-10-CM

## 2012-08-09 DIAGNOSIS — R933 Abnormal findings on diagnostic imaging of other parts of digestive tract: Secondary | ICD-10-CM

## 2012-08-09 DIAGNOSIS — R1013 Epigastric pain: Secondary | ICD-10-CM

## 2012-08-09 LAB — CBC
Hemoglobin: 12.2 g/dL (ref 12.0–15.0)
MCH: 34.9 pg — ABNORMAL HIGH (ref 26.0–34.0)
MCHC: 33.4 g/dL (ref 30.0–36.0)
Platelets: 109 10*3/uL — ABNORMAL LOW (ref 150–400)
RDW: 13.5 % (ref 11.5–15.5)

## 2012-08-09 LAB — COMPREHENSIVE METABOLIC PANEL
ALT: 127 U/L — ABNORMAL HIGH (ref 0–35)
Albumin: 2 g/dL — ABNORMAL LOW (ref 3.5–5.2)
Calcium: 8.1 mg/dL — ABNORMAL LOW (ref 8.4–10.5)
GFR calc Af Amer: >90 mL/min (ref >90)
Glucose, Bld: 91 mg/dL (ref 70–99)
Potassium: 3.7 mEq/L (ref 3.5–5.1)
Sodium: 135 mEq/L (ref 135–145)
Total Protein: 5.4 g/dL — ABNORMAL LOW (ref 6.0–8.3)

## 2012-08-09 LAB — CLOSTRIDIUM DIFFICILE BY PCR: Toxigenic C. Difficile by PCR: NEGATIVE

## 2012-08-09 LAB — HEPATITIS PANEL, ACUTE
HCV Ab: NEGATIVE
Hep A IgM: NEGATIVE
Hep B C IgM: NEGATIVE

## 2012-08-09 LAB — LIPASE, BLOOD: Lipase: 26 U/L (ref 11–59)

## 2012-08-09 MED ORDER — METOPROLOL TARTRATE 25 MG PO TABS
25.0000 mg | ORAL_TABLET | Freq: Two times a day (BID) | ORAL | Status: DC
  Administered 2012-08-09 – 2012-08-11 (×4): 25 mg via ORAL
  Filled 2012-08-09 (×5): qty 1

## 2012-08-09 MED ORDER — POTASSIUM PHOSPHATE DIBASIC 3 MMOLE/ML IV SOLN
10.0000 mmol | Freq: Once | INTRAVENOUS | Status: AC
  Administered 2012-08-09: 10 mmol via INTRAVENOUS
  Filled 2012-08-09: qty 3.33

## 2012-08-09 NOTE — Progress Notes (Signed)
  Echocardiogram 2D Echocardiogram has been performed.  Kara Stewart 08/09/2012, 3:45 PM

## 2012-08-09 NOTE — Consult Note (Signed)
I agree with the detailed consult note prepared by Mr. Marlyne Beards, [place]. Please see my attached discussion of her problems and an algorithm for evaluation.   Kara Stewart. Derrell Lolling, M.D., [facility] General and Minimally invasive Surgery Breast and Colorectal Surgery Office:   [phone] Pager:   [phone]

## 2012-08-09 NOTE — Consult Note (Signed)
General surgery attending note:  I have personally interviewed and examined this patient this evening.  Please see detailed consultation note by Mr. Marlyne Beards, [place].   Assessment: Epigastric pain and nausea of uncertain etiology. This could be due to cholecystitis. This could be due to esophagogastric pathology. This could be due to pancreatitis. Mesenteric ischemia and thromboembolic disease seem less likely.  History chronic alcohol abuse and recent alcohol detox.  Hepatomegaly and splenomegaly and neutropenia and thrombocytopenia, probably secondary to alcohol abuse  Abnormal liver function tests, chronic. Hepatocellular disease seems more likely than biliary tract disease, but both need to be considered.  History of SVT tachycardia. Previously evaluated Aleen Campi.  History GERD, esophageal dilatation, gastritis  Asthma  Hypertension  Family history of cholecystectomy in mother, grandmother, great aunt according to patient.   Plan: Check lipase tonight and complete blood panel tomorrow. If lipase elevated would favor CT scan to evaluate for possible pancreatitis.  Recommend GI consultation for consideration of upper endoscopy and for evaluation of hepatocellular disease  [facility] stimulated hepatobiliary scan tomorrow. I have requested she be n.p.o. after midnight and no narcotics after midnight.  Although it is not clear whether she will need gallbladder surgery yet, it would be wise to request cardiac clearance.   Will follow.  Angelia Mould. Derrell Lolling, M.D., [facility] General and Minimally invasive Surgery Breast and Colorectal Surgery Office:   [phone] Pager:   [phone]

## 2012-08-09 NOTE — Progress Notes (Signed)
[facility] PROGRESS NOTE  Kara Stewart NWG:[id number] DOB: Feb 01, 1976 DOA: 08/06/2012 PCP: No PCP Per Patient  Brief narrative 37 year old female patient with history of alcohol dependence, SVT, anxiety, GERD, hypothyroidism, HTN, asthma, esophageal it is and gastritis by EGD 11/2011, recent alcohol detox, was admitted on 08/07/12 with complaints of 3-4 day history of fevers, cough with clear sputum associated chest pain, dizzy and lightheaded like she was going to pass out on standing or ambulating, low back pain and? Dysuria. In ED, blood pressures were soft but not truly orthostatic, tachycardic, sodium 131, potassium 3.3, AST 161, ALT 85, total bilirubin 1.4, MCV 102, UA not convincing for UTI and CTA chest was negative for PE, pneumonia or other acute cardiopulmonary process. Hospitalist admission was requested. Since admission, patient was hydrated with IV fluids and is clinically euvolemic. She did have orthostatic blood pressure changes on 5/5. She continues to have multiple complaints-dizziness, cough with clear sputum, chest tightness with coughing, occasional nausea and new complaint on 5/6 of epigastric pain. Back pain has improved. Extensive workup only significant for abnormal LFTs. Blood and urine cultures pending. C. difficile negative. Abnormal RUQ ultrasound on 5/6? Cholecystitis-general surgery consulted.  Assessment/Plan: 1. Dehydration: Present on admission, resolved after IV fluids. 2. Orthostatic hypotension, symptomatic: BP dropped from 115/65 > 98/45 on 5/5 AM. Unclear etiology. Clinically euvolemic. Cortisol 11.1. 2-D echo pending. Orthostatic blood pressure on 5/6-negative but patient continues to complain of dizziness on standing or ambulating. 3. Tachycardia/history of SVT: ? secondary to orthostatic hypotension and mostly with activity. Low dose metoprolol started on 5/4. Better but still gets sinus tachycardic in the 140s at times with activity. History of SVT. Increase  metoprolol. Continue monitoring on telemetry. 4. Hypokalemia: Replete when necessary and follow. 5. GERD: Continue PPIs. 6. Alcohol dependence: Recently completed detox but claims to have a single beer 3 days PTA. Currently no overt features of withdrawal. Low CIWA scores < 5. Counseled again regarding cessation. Patient used to drink to a point of vodka up to 3 weeks ago. 7. Hypothyroidism: TSH 2.525. 8. Hypertension: Controlled 9. Cough/possible acute viral bronchitis and asthma: CT chest negative for acute findings. Symptomatic treatment. No antibiotics at this time. Patient complains of bilateral upper back pain-likely related to coughing spells-significantly improved. 10. Abnormal LFTs/transaminitis/? Alcoholic hepatitis/? Acute Cholecystitis: These LFTs have been abnormal even a year ago at which time HBsAg and hepatitis C antibody were negative. Likely secondary to continued alcohol abuse. Followup Repeat acute hepatitis panel. LFTs not significantly changed compared to yesterday. Patient complains of epigastric pain and is tender in the epigastric region. RUQ ultrasound:? Acute cholecystitis-general surgery consulted. Follow recommendations. 11.  Leukopenia, thrombocytopenia and macrocytosis: Likely secondary to alcohol abuse. Stable. 12. Right middle lobe 3 mm lung nodule on CT chest: Denies ever smoking. Outpatient followup as deemed necessary. 13. Low-grade fevers: No specific focus of infection except RUQ ultrasound findings. Blood and urine cultures pending. C. difficile PCR negative. Hold antibiotics pending surgery review-discussed with ID prior to ultrasound and agrees.  Code Status: Full Family Communication:  discussed with patient. Disposition Plan:  home when medically stable-not medically ready for DC today.   Consultants:  General surgery-pending  Procedures:  None  Antibiotics:  None   HPI/Subjective: Indicates that she is unchanged-continues to feel dizzy on  standing or ambulating, nauseous and epigastric pain. No vomiting. Back pain has improved. Per nursing, sinus tachycardia in the 140s with activity.  Objective: Filed Vitals:   08/09/12 5784 08/09/12 0451 08/09/12 6962 08/09/12 1459  BP: 125/77 141/76 141/66 113/65  Pulse: 94 103 138 95  Temp: 98.7 F (37.1 C)   99.6 F (37.6 C)  TempSrc: Oral   Oral  Resp: 18   20  Height:      Weight:      SpO2: 99%   98%    Intake/Output Summary (Last 24 hours) at 08/09/12 1533 Last data filed at 08/08/12 2240  Gross per 24 hour  Intake      0 ml  Output      0 ml  Net      0 ml   Filed Weights   08/07/12 0439  Weight: 89.404 kg (197 lb 1.6 oz)    Exam:  Patient was examined with her female nurse in the room.   General exam: Comfortable.  Respiratory system: Clear. No increased work of breathing.  Cardiovascular system: S1 & S2 heard, RRR. No JVD, murmurs, gallops, clicks or pedal edema. telemetry: Sinus tachycardia in the 110s and sinus rhythm. At times has sinus tachycardia in the 140s.  Gastrointestinal system: Abdomen is nondistended, soft. Tender in epigastrium and?? RUQ but no rigidity, guarding or rebound. Normal bowel sounds heard.  Central nervous system: Alert and oriented. No focal neurological deficits.  Extremities: Symmetric 5 x 5 power. Not tremulous.    Data Reviewed: Basic Metabolic Panel:  Recent Labs Lab 08/06/12 1845 08/07/12 0625 08/08/12 0420 08/09/12 0415  NA 131* 134* 136 135  K 3.3* 3.5 3.2* 3.7  CL 93* 106 107 104  CO2 23 19 22 23   GLUCOSE 76 110* 100* 91  BUN 6 6 3* <3*  CREATININE 0.83 0.74 0.80 0.72  CALCIUM 8.6 6.9* 7.2* 8.1*  MG  --  1.5 1.6  --   PHOS  --  1.7*  --   --    Liver Function Tests:  Recent Labs Lab 08/06/12 1845 08/07/12 0625 08/08/12 0420 08/09/12 0415  AST 161* 133* 256* 239*  ALT 85* 64* 105* 127*  ALKPHOS 118* 90 110 121*  BILITOT 1.4* 1.5* 1.8* 1.8*  PROT 6.8 5.2* 5.0* 5.4*  ALBUMIN 2.8* 2.0* 2.0* 2.0*    No results found for this basename: LIPASE, AMYLASE,  in the last 168 hours No results found for this basename: AMMONIA,  in the last 168 hours CBC:  Recent Labs Lab 08/06/12 1845 08/07/12 0706 08/08/12 0420 08/09/12 0415  WBC 4.8 3.6* 3.1* 3.6*  HGB 15.2* 12.7 11.4* 12.2  HCT 42.9 36.5 34.0* 36.5  MCV 101.9* 102.5* 103.7* 104.3*  PLT 167 105* 87* 109*   Cardiac Enzymes: No results found for this basename: CKTOTAL, CKMB, CKMBINDEX, TROPONINI,  in the last 168 hours BNP (last 3 results) No results found for this basename: PROBNP,  in the last 8760 hours CBG: No results found for this basename: GLUCAP,  in the last 168 hours  Recent Results (from the past 240 hour(s))  CULTURE, BLOOD (ROUTINE X 2)     Status: None   Collection Time    08/08/12 10:30 AM      Result Value Range Status   Specimen Description BLOOD LEFT HAND   Final   Special Requests BOTTLES DRAWN AEROBIC AND ANAEROBIC 3CC   Final   Culture  Setup Time 08/08/2012 13:53   Final   Culture     Final   Value:        BLOOD CULTURE RECEIVED NO GROWTH TO DATE CULTURE WILL BE HELD FOR 5 DAYS BEFORE ISSUING A FINAL NEGATIVE REPORT  Report Status PENDING   Incomplete  CULTURE, BLOOD (ROUTINE X 2)     Status: None   Collection Time    08/08/12 10:45 AM      Result Value Range Status   Specimen Description BLOOD RIGHT ARM   Final   Special Requests BOTTLES DRAWN AEROBIC AND ANAEROBIC [facility]   Final   Culture  Setup Time 08/08/2012 13:52   Final   Culture     Final   Value:        BLOOD CULTURE RECEIVED NO GROWTH TO DATE CULTURE WILL BE HELD FOR 5 DAYS BEFORE ISSUING A FINAL NEGATIVE REPORT   Report Status PENDING   Incomplete  CLOSTRIDIUM DIFFICILE BY PCR     Status: None   Collection Time    08/08/12  7:11 PM      Result Value Range Status   C difficile by pcr NEGATIVE  NEGATIVE Final     Studies: US Abdomen Complete  08/09/2012  *RADIOLOGY REPORT*  Clinical Data:  Mid abdominal and chest pain.  COMPLETE  ABDOMINAL ULTRASOUND  Comparison:  08/07/2012 chest CT.  10/19/2011 ultrasound.  Findings:  Gallbladder:  Diffuse gallbladder wall thickening with mild pericholecystic fluid.  The patient was tender over this region during scanning.  This raises possibility of cholecystitis.  Other causes of gallbladder wall thickening such as metabolic abnormality or congestive heart failure felt to be secondary considerations. Clinical correlation recommended.  Common bile duct:  .  6.7 mm peri  Liver:  Enlarged and echogenic suggestive of diffuse fatty infiltration.  No obvious focal mass  IVC:  Limited by bowel gas.  Pancreas:  Limited by bowel gas.  Spleen:  Enlarged spanning over 15.7 cm with volume of 854.6 ml.  Right Kidney:  10.6 cm. No hydronephrosis or renal mass.  Left Kidney:  10.3 cm. No hydronephrosis or renal mass.  Abdominal aorta:  Limited y bowel gas.  Maximal AP dimension obtained 1.7 cm.,  IMPRESSION: Diffuse gallbladder wall thickening with mild pericholecystic fluid.  The patient was tender over this region during scanning. This raises possibility of cholecystitis.  Enlarged fatty liver.  Splenomegaly.  Limited evaluation of the inferior vena cava, pancreas and aorta secondary to bowel gas.  Please see above.  This has been made a PRA call report utilizing dashboard call feature. .   Original Report Authenticated By: Lacy Duverney, M.D.      Additional labs:   Scheduled Meds: . sodium chloride   Intravenous Once  . docusate sodium  100 mg Oral BID  . folic acid  1 mg Oral Daily  . metoprolol tartrate  12.5 mg Oral BID  . pantoprazole  40 mg Oral Daily  . sodium chloride  3 mL Intravenous Q12H  . thiamine  100 mg Oral Daily   Continuous Infusions:    Active Problems:   GERD (gastroesophageal reflux disease)   Alcohol abuse   Alcoholic hepatitis   Dehydration   Tachycardia   Orthostatic hypotension   Hypokalemia    Time spent: 45 minutes.    [facility]  [facility] Pager [phone].   If 8PM-8AM, please contact night-coverage at [email or web address], password [facility] 08/09/2012, 3:33 PM  LOS: 3 days

## 2012-08-09 NOTE — Progress Notes (Signed)
MEDICATION RELATED CONSULT NOTE - INITIAL   Pharmacy Consult for IV Phosh repletion Indication: low phosphorus level  Allergies  Allergen Reactions  . Penicillins Anaphylaxis   Patient Measurements: Height: 5\' 4"  (162.6 cm) Weight: 197 lb 1.6 oz (89.404 kg) IBW/kg (Calculated) : 54.7  Labs:  Recent Labs  08/06/12 1845 08/07/12 0625 08/07/12 0706 08/08/12 0420 08/09/12 0415  WBC 4.8  --  3.6* 3.1* 3.6*  HGB 15.2*  --  12.7 11.4* 12.2  HCT 42.9  --  36.5 34.0* 36.5  PLT 167  --  105* 87* 109*  CREATININE 0.83 0.74  --  0.80 0.72  MG  --  1.5  --  1.6  --   PHOS  --  1.7*  --   --   --   ALBUMIN 2.8* 2.0*  --  2.0* 2.0*  PROT 6.8 5.2*  --  5.0* 5.4*  AST 161* 133*  --  256* 239*  ALT 85* 64*  --  105* 127*  ALKPHOS 118* 90  --  110 121*  BILITOT 1.4* 1.5*  --  1.8* 1.8*  BILIDIR 0.7*  --   --   --   --   IBILI 0.7  --   --   --   --    Estimated Creatinine Clearance: 105.3 ml/min (by C-G formula based on Cr of 0.72).   Medications:  Scheduled:  . sodium chloride   Intravenous Once  . docusate sodium  100 mg Oral BID  . folic acid  1 mg Oral Daily  . metoprolol tartrate  25 mg Oral BID  . pantoprazole  40 mg Oral Daily  . sodium chloride  3 mL Intravenous Q12H  . thiamine  100 mg Oral Daily  . [DISCONTINUED] metoprolol tartrate  12.5 mg Oral BID   Infusions:   PRN: acetaminophen, acetaminophen, albuterol, guaiFENesin, LORazepam, ondansetron (ZOFRAN) IV, ondansetron, oxyCODONE  Assessment: 36 yoF admit 5/4 with problems including dehydration, orthostasis, hypokalemia and history of alcohol dependence.  Phosphorus level was low (1.7) on 5/4  Other electrolyte levels are wnl (Na 135, K 3.7)  SCr stable with CrCl > 100 ml/min  IBW used for dosing is 54.7kg  Goal of Therapy:  Phosphorus level 2.3-4.6  Plan:   Potassium Phosphate IVPB x1 dose  Follow up repeat phos level tomorrow morning.   Lynann Beaver PharmD, BCPS Pager  [phone] 08/09/2012 4:21 PM

## 2012-08-09 NOTE — Telephone Encounter (Signed)
noted 
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
## 2012-08-10 ENCOUNTER — Inpatient Hospital Stay (HOSPITAL_COMMUNITY): Payer: MEDICAID

## 2012-08-10 ENCOUNTER — Encounter (HOSPITAL_COMMUNITY): Payer: Self-pay | Admitting: Gastroenterology

## 2012-08-10 DIAGNOSIS — R197 Diarrhea, unspecified: Secondary | ICD-10-CM

## 2012-08-10 DIAGNOSIS — K7689 Other specified diseases of liver: Secondary | ICD-10-CM

## 2012-08-10 LAB — COMPREHENSIVE METABOLIC PANEL
ALT: 106 U/L — ABNORMAL HIGH (ref 0–35)
Albumin: 2 g/dL — ABNORMAL LOW (ref 3.5–5.2)
BUN: 3 mg/dL — ABNORMAL LOW (ref 6–23)
Calcium: 8.1 mg/dL — ABNORMAL LOW (ref 8.4–10.5)
GFR calc Af Amer: >90 mL/min (ref >90)
Glucose, Bld: 84 mg/dL (ref 70–99)
Sodium: 133 mEq/L — ABNORMAL LOW (ref 135–145)
Total Protein: 5.3 g/dL — ABNORMAL LOW (ref 6.0–8.3)

## 2012-08-10 LAB — LIPASE, BLOOD: Lipase: 19 U/L (ref 11–59)

## 2012-08-10 LAB — URINE CULTURE: Colony Count: 25000

## 2012-08-10 LAB — CBC WITH DIFFERENTIAL/PLATELET
Basophils Absolute: 0.1 10*3/uL (ref 0.0–0.1)
Lymphocytes Relative: 65 % — ABNORMAL HIGH (ref 12–46)
Monocytes Relative: 7 % (ref 3–12)
Neutrophils Relative %: 25 % — ABNORMAL LOW (ref 43–77)
Platelets: 121 10*3/uL — ABNORMAL LOW (ref 150–400)
RBC: 3.49 MIL/uL — ABNORMAL LOW (ref 3.87–5.11)
RDW: 13.6 % (ref 11.5–15.5)
WBC: 4.7 10*3/uL (ref 4.0–10.5)

## 2012-08-10 LAB — MAGNESIUM: Magnesium: 2.1 mg/dL (ref 1.5–2.5)

## 2012-08-10 LAB — APTT: aPTT: 38 seconds — ABNORMAL HIGH (ref 24–37)

## 2012-08-10 MED ORDER — SUCRALFATE 1 GM/10ML PO SUSP
1.0000 g | Freq: Three times a day (TID) | ORAL | Status: DC
  Administered 2012-08-10 – 2012-08-13 (×12): 1 g via ORAL
  Filled 2012-08-10 (×16): qty 10

## 2012-08-10 MED ORDER — LORAZEPAM 2 MG/ML IJ SOLN
INTRAMUSCULAR | Status: AC
  Filled 2012-08-10: qty 1

## 2012-08-10 MED ORDER — GADOBENATE DIMEGLUMINE 529 MG/ML IV SOLN
20.0000 mL | Freq: Once | INTRAVENOUS | Status: AC | PRN
  Administered 2012-08-10: 20 mL via INTRAVENOUS

## 2012-08-10 MED ORDER — PANTOPRAZOLE SODIUM 40 MG PO TBEC
40.0000 mg | DELAYED_RELEASE_TABLET | Freq: Two times a day (BID) | ORAL | Status: DC
  Administered 2012-08-10 – 2012-08-13 (×7): 40 mg via ORAL
  Filled 2012-08-10 (×6): qty 1

## 2012-08-10 MED ORDER — TECHNETIUM TC 99M MEBROFENIN IV KIT
5.4000 | PACK | Freq: Once | INTRAVENOUS | Status: AC | PRN
  Administered 2012-08-10: 5 via INTRAVENOUS

## 2012-08-10 MED ORDER — LORAZEPAM 2 MG/ML IJ SOLN
2.0000 mg | Freq: Once | INTRAMUSCULAR | Status: AC
  Administered 2012-08-10: 2 mg via INTRAVENOUS

## 2012-08-10 NOTE — Progress Notes (Signed)
Pt's HR does up to 160-170's while ambulating to the bathroom.

## 2012-08-10 NOTE — Consult Note (Signed)
Reason for Consult: Tachycardia Referring Physician: Dr. Malachi Bonds Cardiologist: Dr. Della Goo is an 37 y.o. female.  HPI:   The patient is a 37 year old female history of SVT, anxiety, alcohol abuse, reflux, hypertriglyceridemia, hypothyroidism, htn. Has not been seen by Dr. Tresa Endo since April 2011. At that time the patient had admitted to drinking in excess of 2 L of caffeinated soft drinks daily and was also taking Slim Quick Ultra in an attempt to lose weight. At that time Dr. Tresa Endo prescribed metoprolol tartrate and instructed the patient to take 25-50 mg when necessary for palpitations.   She was recently seen at [facility] and treated for sinusitis.  The patient had a nuclear stress test in April 2001 she exercised for 7 minutes with no EKG changes was no significant ischemia demonstrated. Shows the head 2-D echocardiogram at that same time showed normal left ventricular systolic function and EF. There is mild mitral valve regurgitation mild tricuspid regurgitation.  She presented to [facility] long with cough, low-grade fever, lightheadedness and is being treated for dehydration, abnormal LFTs, alcohol withdrawal.  She drinks as much as a fifth of liquor per day.  CT scan of the chest was negative for pulmonary embolism.  Abdominal ultrasound significant for diffuse gallbladder wall thickening and mild pericholecystic fluid, enlarged fatty liver, splenomegaly. Chest x-ray showed mild bibasilar opacities and peribronchial markings, nonspecific; atelectasis, early infiltrate, or atypical/viral pneumonia.  Recent EKG showed normal sinus rhythm mildly prolonged QTC and nonspecific inferior T-wave changes.  2-D echocardiogram showed ejection fraction 65-70% grade 1 diastolic dysfunction, normal LV filling pressures. Left atrium was normal in size. LV wall motion not assessed.  Today she had a normal hepatobiliary scan with normal ejection fraction the gallbladder.   Patient also reports she develops  sharp chest pain left of her sternum when her heart rate is elevated. She also reports some nausea and diaphoresis at the time.  It does not radiate to her arm neck back or jaw.  At the worst is 10 out of 10 in intensity. She also notes that the pain is worse with deep breathing and palpation. She occasionally has unilateral lower extremity edema.  She has been living in [facility] and states that's why she's not had any followup with Dr. Tresa Endo. She has been taking her Lopressor as needed basis he is taking 25 mg. She states to 50 mg 2 from.    Medications: Scheduled Meds: . sodium chloride   Intravenous Once  . docusate sodium  100 mg Oral BID  . folic acid  1 mg Oral Daily  . LORazepam      . metoprolol tartrate  25 mg Oral BID  . pantoprazole  40 mg Oral BID  . sodium chloride  3 mL Intravenous Q12H  . sucralfate  1 g Oral TID WC & HS  . thiamine  100 mg Oral Daily   Continuous Infusions:  PRN Meds:.acetaminophen, acetaminophen, albuterol, guaiFENesin, LORazepam, ondansetron (ZOFRAN) IV, ondansetron, oxyCODONE   Past Medical History  Diagnosis Date  . SVT (supraventricular tachycardia)   . Hiatal hernia   . Anxiety   . Acid reflux   . Dyspepsia 2004    Dx w/ PUD but no EGD, [place], [place]   . Hypertriglyceridemia   . Hypothyroid   . Iritis     frequent  . Hypertension   . Asthma   . Panic attacks     Past Surgical History  Procedure Laterality Date  . Esophagogastroduodenoscopy  11/06/2011  [name]: MILD Esophagitis/PATENT ESOPHAGEAL Stricture/  Moderate gastritis. Bx no.hpylori or celiac, +gastritis    Family History  Problem Relation Age of Onset  . Stomach cancer Paternal Grandfather   . Breast cancer Maternal Grandmother     Social History:  reports that she has never smoked. She does not have any smokeless tobacco history on file. She reports that she does not drink alcohol or use illicit drugs.  Allergies:  Allergies  Allergen Reactions  . Penicillins  Anaphylaxis   Results for orders placed during the hospital encounter of 08/06/12 (from the past 48 hour(s))  CLOSTRIDIUM DIFFICILE BY PCR     Status: None   Collection Time    08/08/12  7:11 PM      Result Value Range   C difficile by pcr NEGATIVE  NEGATIVE  CBC     Status: Abnormal   Collection Time    08/09/12  4:15 AM      Result Value Range   WBC 3.6 (*) 4.0 - 10.5 K/uL   RBC 3.50 (*) 3.87 - 5.11 MIL/uL   Hemoglobin 12.2  12.0 - 15.0 g/dL   HCT 30.8  65.7 - 84.6 %   MCV 104.3 (*) 78.0 - 100.0 fL   MCH 34.9 (*) 26.0 - 34.0 pg   MCHC 33.4  30.0 - 36.0 g/dL   RDW 96.2  95.2 - 84.1 %   Platelets 109 (*) 150 - 400 K/uL   Comment: CONSISTENT WITH PREVIOUS RESULT  COMPREHENSIVE METABOLIC PANEL     Status: Abnormal   Collection Time    08/09/12  4:15 AM      Result Value Range   Sodium 135  135 - 145 mEq/L   Potassium 3.7  3.5 - 5.1 mEq/L   Chloride 104  96 - 112 mEq/L   CO2 23  19 - 32 mEq/L   Glucose, Bld 91  70 - 99 mg/dL   BUN <3 (*) 6 - 23 mg/dL   Comment: REPEATED TO VERIFY   Creatinine, Ser 0.72  0.50 - 1.10 mg/dL   Calcium 8.1 (*) 8.4 - 10.5 mg/dL   Total Protein 5.4 (*) 6.0 - 8.3 g/dL   Albumin 2.0 (*) 3.5 - 5.2 g/dL   AST 324 (*) 0 - 37 U/L   ALT 127 (*) 0 - 35 U/L   Alkaline Phosphatase 121 (*) 39 - 117 U/L   Total Bilirubin 1.8 (*) 0.3 - 1.2 mg/dL   GFR calc non Af Amer >90  >90 mL/min   GFR calc Af Amer >90  >90 mL/min   Comment:            The eGFR has been calculated     using the CKD EPI equation.     This calculation has not been     validated in all clinical     situations.     eGFR's persistently     <90 mL/min signify     possible Chronic Kidney Disease.  LIPASE, BLOOD     Status: None   Collection Time    08/09/12  4:15 AM      Result Value Range   Lipase 26  11 - 59 U/L  URINE CULTURE     Status: None   Collection Time    08/09/12  8:35 AM      Result Value Range   Specimen Description URINE, CLEAN CATCH     Special Requests NONE      Culture  Setup  Time 08/09/2012 12:38     Colony Count 25,000 COLONIES/ML     Culture       Value: Multiple bacterial morphotypes present, none predominant. Suggest appropriate recollection if clinically indicated.   Report Status 08/10/2012 FINAL    COMPREHENSIVE METABOLIC PANEL     Status: Abnormal   Collection Time    08/10/12  4:57 AM      Result Value Range   Sodium 133 (*) 135 - 145 mEq/L   Potassium 4.4  3.5 - 5.1 mEq/L   Comment: MODERATE HEMOLYSIS   Chloride 100  96 - 112 mEq/L   CO2 25  19 - 32 mEq/L   Glucose, Bld 84  70 - 99 mg/dL   BUN 3 (*) 6 - 23 mg/dL   Creatinine, Ser 4.54  0.50 - 1.10 mg/dL   Calcium 8.1 (*) 8.4 - 10.5 mg/dL   Total Protein 5.3 (*) 6.0 - 8.3 g/dL   Albumin 2.0 (*) 3.5 - 5.2 g/dL   AST 098 (*) 0 - 37 U/L   ALT 106 (*) 0 - 35 U/L   Alkaline Phosphatase 103  39 - 117 U/L   Total Bilirubin 2.1 (*) 0.3 - 1.2 mg/dL   GFR calc non Af Amer >90  >90 mL/min   GFR calc Af Amer >90  >90 mL/min   Comment:            The eGFR has been calculated     using the CKD EPI equation.     This calculation has not been     validated in all clinical     situations.     eGFR's persistently     <90 mL/min signify     possible Chronic Kidney Disease.  MAGNESIUM     Status: None   Collection Time    08/10/12  4:57 AM      Result Value Range   Magnesium 2.1  1.5 - 2.5 mg/dL  PHOSPHORUS     Status: None   Collection Time    08/10/12  4:57 AM      Result Value Range   Phosphorus 3.2  2.3 - 4.6 mg/dL  LIPASE, BLOOD     Status: None   Collection Time    08/10/12  4:57 AM      Result Value Range   Lipase 19  11 - 59 U/L  CBC WITH DIFFERENTIAL     Status: Abnormal   Collection Time    08/10/12  4:57 AM      Result Value Range   WBC 4.7  4.0 - 10.5 K/uL   RBC 3.49 (*) 3.87 - 5.11 MIL/uL   Hemoglobin 12.2  12.0 - 15.0 g/dL   HCT 11.9  14.7 - 82.9 %   MCV 104.3 (*) 78.0 - 100.0 fL   MCH 35.0 (*) 26.0 - 34.0 pg   MCHC 33.5  30.0 - 36.0 g/dL   RDW 56.2  13.0 -  86.5 %   Platelets 121 (*) 150 - 400 K/uL   Neutrophils Relative 25 (*) 43 - 77 %   Lymphocytes Relative 65 (*) 12 - 46 %   Monocytes Relative 7  3 - 12 %   Eosinophils Relative 1  0 - 5 %   Basophils Relative 2 (*) 0 - 1 %   Neutro Abs 1.2 (*) 1.7 - 7.7 K/uL   Lymphs Abs 3.1  0.7 - 4.0 K/uL   Monocytes Absolute 0.3  0.1 - 1.0 K/uL  Eosinophils Absolute 0.0  0.0 - 0.7 K/uL   Basophils Absolute 0.1  0.0 - 0.1 K/uL   WBC Morphology ATYPICAL LYMPHOCYTES     Comment: INCREASED BANDS (>20% BANDS)   Smear Review PLATELET CLUMPS NOTED ON SMEAR     Comment: PLATELETS APPEAR ADEQUATE    US Abdomen Complete  08/09/2012  *RADIOLOGY REPORT*  Clinical Data:  Mid abdominal and chest pain.  COMPLETE ABDOMINAL ULTRASOUND  Comparison:  08/07/2012 chest CT.  10/19/2011 ultrasound.  Findings:  Gallbladder:  Diffuse gallbladder wall thickening with mild pericholecystic fluid.  The patient was tender over this region during scanning.  This raises possibility of cholecystitis.  Other causes of gallbladder wall thickening such as metabolic abnormality or congestive heart failure felt to be secondary considerations. Clinical correlation recommended.  Common bile duct:  .  6.7 mm peri  Liver:  Enlarged and echogenic suggestive of diffuse fatty infiltration.  No obvious focal mass  IVC:  Limited by bowel gas.  Pancreas:  Limited by bowel gas.  Spleen:  Enlarged spanning over 15.7 cm with volume of 854.6 ml.  Right Kidney:  10.6 cm. No hydronephrosis or renal mass.  Left Kidney:  10.3 cm. No hydronephrosis or renal mass.  Abdominal aorta:  Limited y bowel gas.  Maximal AP dimension obtained 1.7 cm.,  IMPRESSION: Diffuse gallbladder wall thickening with mild pericholecystic fluid.  The patient was tender over this region during scanning. This raises possibility of cholecystitis.  Enlarged fatty liver.  Splenomegaly.  Limited evaluation of the inferior vena cava, pancreas and aorta secondary to bowel gas.  Please see above.   This has been made a PRA call report utilizing dashboard call feature. .   Original Report Authenticated By: Lacy Duverney, M.D.    Nm Hepato W/eject Fract  08/10/2012  *RADIOLOGY REPORT*  Clinical Data: Right upper quadrant pain and nausea.  Abnormal gallbladder ultrasound.  NUCLEAR MEDICINE HEPATOBILIARY WITH GB, PHARM AND QUAN MEASURE  Radiopharmaceutical:  5.4 mCi technetium Choletec IV.  8 ounces of Ensure orally.  Comparison: Ultrasound dated 08/09/2012  Findings: The gallbladder is visualized at 20 minutes.  There is activity in the bowel immediately after Ensure ingestion.  Ejection fraction was 47.2% at 59 minutes.  IMPRESSION: Normal hepatobiliary scan.  Normal ejection fraction.   Original Report Authenticated By: Francene Boyers, M.D.    2-D echocardiogram Study Conclusions  - Procedure narrative: Transthoracic echocardiography. Technically difficult study with suboptimal image quality. - Left ventricle: The cavity size was normal. Wall thickness was normal. Systolic function was vigorous. The estimated ejection fraction was in the range of 65% to 70%. Images were inadequate for LV wall motion assessment. Doppler parameters are consistent with abnormal left ventricular relaxation (grade 1 diastolic dysfunction). The E/e' ratio is <10, suggesting normal LV filling pressure. - Left atrium: The atrium was normal in size. - Systemic veins: The IVC is not well visualized.   Review of Systems  Constitutional: Positive for fever and diaphoresis.  HENT: Positive for congestion.   Respiratory: Positive for cough and shortness of breath.   Cardiovascular: Positive for chest pain, palpitations and leg swelling. Negative for orthopnea and PND.  Gastrointestinal: Positive for nausea and abdominal pain. Negative for vomiting, blood in stool and melena.  Neurological: Positive for dizziness and weakness.   Blood pressure 98/58, pulse 122, temperature 100.4 F (38 C), temperature source Oral,  resp. rate 18, height 5\' 4"  (1.626 m), weight 197 lb 1.6 oz (89.404 kg), last menstrual period 03/06/2012, SpO2 95.00%. Physical  Exam  Constitutional: She is oriented to person, place, and time. She appears well-developed. No distress.  Obese  HENT:  Head: Normocephalic and atraumatic.  Eyes: EOM are normal. Pupils are equal, round, and reactive to light. No scleral icterus.  Neck: Normal range of motion. Neck supple.  Cardiovascular: Normal rate, regular rhythm, S1 normal and S2 normal.   No murmur heard. Pulses:      Radial pulses are 2+ on the right side, and 2+ on the left side.       Dorsalis pedis pulses are 2+ on the right side, and 2+ on the left side.       Posterior tibial pulses are 2+ on the right side, and 2+ on the left side.  No carotid bruit  Respiratory: Effort normal and breath sounds normal. She has no wheezes. She has no rales. She exhibits tenderness (left of sternum).  GI: Soft. Bowel sounds are normal. There is tenderness.  Musculoskeletal: She exhibits no edema.  Neurological: She is alert and oriented to person, place, and time. She exhibits normal muscle tone.  Skin: Skin is warm and dry.  Psychiatric: She has a normal mood and affect.    Assessment/Plan: Active Problems:   Tachycardia   Dehydration   Elevated LFTs   Fatty liver   GERD (gastroesophageal reflux disease)   Alcohol abuse   Alcoholic hepatitis   Orthostatic hypotension   Hypokalemia   Thrombocytopenia   Chest pain, atypical  Plan:  LFTs are trending down. Initial troponin POC negative.  Potassium and magnesium within normal limits.  Blood cultures are negative to date.  She is currently on Lopressor 25 mg twice daily.  She is experiencing tachycardia when she exerts herself and still experiencing some dizziness with position changes. However, she is currently not orthostatic and is status post +5 L of saline.  Negative PE. Patient is deconditioned and obese which may be partly the cause of  her tachycardia.  Continue with Lopressor as tolerated.  In the opinion to follow   HAGER, BRYAN 08/10/2012, 1:05 PM      Agree with note written by Jones Skene PAC  Pt with H/O ETOH use admitted with increased HR, abd/CP. Neg CTA. Enz neg. Mild increased LFTs. GI following. She has been on PRN BB in past prescribed by Dr. Tresa Endo for SVT. 2D Nl. Exam benign. Doubt cardiac cause of CP. Recommend Lopressor BID indefinitely. Will follow with you.   Runell Gess 08/10/2012 5:35 PM

## 2012-08-10 NOTE — Progress Notes (Signed)
[facility] PROGRESS NOTE  Ricky Doan ZOX:[id number] DOB: Mar 01, 1976 DOA: 08/06/2012 PCP: No PCP Per Patient  Brief narrative 37 year old female patient with history of alcohol dependence, SVT, anxiety, GERD, hypothyroidism, HTN, asthma, esophageal it is and gastritis by EGD 11/2011, recent alcohol detox, was admitted on 08/07/12 with complaints of 3-4 day history of fevers, cough with clear sputum associated chest pain, dizzy and lightheaded like she was going to pass out on standing or ambulating, low back pain and? Dysuria. In ED, blood pressures were soft but not truly orthostatic, tachycardic, sodium 131, potassium 3.3, AST 161, ALT 85, total bilirubin 1.4, MCV 102, UA not convincing for UTI and CTA chest was negative for PE, pneumonia or other acute cardiopulmonary process. Hospitalist admission was requested. Since admission, patient was hydrated with IV fluids and is clinically euvolemic. She did have orthostatic blood pressure changes on 5/5. She continues to have multiple complaints-dizziness, cough with clear sputum, chest tightness with coughing, occasional nausea and new complaint on 5/6 of epigastric pain. Back pain has improved. Extensive workup only significant for abnormal LFTs. Blood and urine cultures pending. C. difficile negative. Abnormal RUQ ultrasound on 5/6? Cholecystitis-general surgery consulted.  Assessment/Plan: 1. Dehydration: Present on admission, resolved after IV fluids. Orthostatic hypotension, symptomatic: BP dropped from 115/65 > 98/45 on 5/5 AM. Unclear etiology. Clinically euvolemic. Cortisol 11.1. 2-D echo with grade 1 diastolic dysfunction.  -  Persistent symptomatic orthostasis -  TSH 2.525 2. Tachycardia/history of SVT: ? secondary to orthostatic hypotension and mostly with activity.  -  Low dose metoprolol started on 5/4 and increased 5/6, but still tachycardic -  Cardiology consult for pre-operative assessment and further management.  -  TSH  wnl 3. Hypokalemia: Replete when necessary and follow. 4. GERD: Continue PPI 5. Alcohol dependence: Recently completed detox but claims to have a single beer 3 days PTA.  CIWA scores < 5. -  Counseled cessation 6. Hypothyroidism: TSH 2.525. 7. Hypertension: Controlled 8. Cough/possible acute viral bronchitis and asthma: CT chest negative for acute findings. Symptomatic treatment. No antibiotics at this time. Patient complains of bilateral upper back pain-likely related to coughing spells-significantly improved. Abnormal LFTs/transaminitis/? Alcoholic hepatitis:  These LFTs have been abnormal even a year ago at which time HBsAg and hepatitis C antibody were negative. Likely secondary to continued alcohol abuse and fatty liver.  Lipase still normal.  No radiation of pain to back.  DDx includes gallstone which passed, pancreatitis, gastritis, PUD.  -  Acute hepatitis panel:  negative -  RUQ ultrasound:? Acute cholecystitis, but HIDA today negative -  Appreciate general surgery recommendations  -  GI consultation  -   Increase to BID PPI and add carafate  9.  Leukopenia, thrombocytopenia and macrocytosis: Likely secondary to alcohol abuse. Stable.   10. Right middle lobe 3 mm lung nodule on CT chest: Denies ever smoking. Outpatient followup as deemed necessary. Low-grade fevers: No specific focus of infection except RUQ ultrasound findings. Blood and urine cultures pending. C. difficile PCR negative.  Continue to hold Abx  Code Status: Full Family Communication:  discussed with patient. Disposition Plan:  home when medically stable-not medically ready for DC today.   Consultants:  General surgery-pending  Procedures:  None  Antibiotics:  None   HPI/Subjective: Indicates that she is continues to feel dizzy on standing or ambulating, nauseous, and epigastric pain without radiation to the back.  Has increased SOB and cough when sitting up.  Sinus tachycardia at rest which increases with  exertion.  Objective: Filed Vitals:   08/09/12 2151 08/10/12 0517 08/10/12 0520 08/10/12 0522  BP: 112/63 102/85 113/70 98/58  Pulse: 99 99 116 122  Temp: 98.5 F (36.9 C) 100.4 F (38 C)    TempSrc: Oral Oral    Resp: 18 18    Height:      Weight:      SpO2: 98% 95%     No intake or output data in the 24 hours ending 08/10/12 1025 Filed Weights   08/07/12 0439  Weight: 89.404 kg (197 lb 1.6 oz)    Exam:   General exam: CF, NAD  HEENT:  MMM, no tongue fasciculations, NCAT  Respiratory system: Clear. No increased work of breathing.  Cardiovascular system:  Tachycardic, regular rhythm, nl S1 & S2 heard, no mrg.  Telemetry: Sinus tachycardia in the 110s  Gastrointestinal system:  NABS, soft, nondistended, TTP in the epigastrium without rebound or guarding.    Central nervous system: Alert and oriented. No focal neurological deficits.    Extremities: Symmetric 5 x 5 power. Not tremulous.    Data Reviewed: Basic Metabolic Panel:  Recent Labs Lab 08/06/12 1845 08/07/12 0625 08/08/12 0420 08/09/12 0415 08/10/12 0457  NA 131* 134* 136 135 133*  K 3.3* 3.5 3.2* 3.7 4.4  CL 93* 106 107 104 100  CO2 23 19 22 23 25   GLUCOSE 76 110* 100* 91 84  BUN 6 6 3* <3* 3*  CREATININE 0.83 0.74 0.80 0.72 0.79  CALCIUM 8.6 6.9* 7.2* 8.1* 8.1*  MG  --  1.5 1.6  --  2.1  PHOS  --  1.7*  --   --  3.2   Liver Function Tests:  Recent Labs Lab 08/06/12 1845 08/07/12 0625 08/08/12 0420 08/09/12 0415 08/10/12 0457  AST 161* 133* 256* 239* 152*  ALT 85* 64* 105* 127* 106*  ALKPHOS 118* 90 110 121* 103  BILITOT 1.4* 1.5* 1.8* 1.8* 2.1*  PROT 6.8 5.2* 5.0* 5.4* 5.3*  ALBUMIN 2.8* 2.0* 2.0* 2.0* 2.0*    Recent Labs Lab 08/09/12 0415 08/10/12 0457  LIPASE 26 19   No results found for this basename: AMMONIA,  in the last 168 hours CBC:  Recent Labs Lab 08/06/12 1845 08/07/12 0706 08/08/12 0420 08/09/12 0415 08/10/12 0457  WBC 4.8 3.6* 3.1* 3.6* 4.7  NEUTROABS   --   --   --   --  1.2*  HGB 15.2* 12.7 11.4* 12.2 12.2  HCT 42.9 36.5 34.0* 36.5 36.4  MCV 101.9* 102.5* 103.7* 104.3* 104.3*  PLT 167 105* 87* 109* 121*   Cardiac Enzymes: No results found for this basename: CKTOTAL, CKMB, CKMBINDEX, TROPONINI,  in the last 168 hours BNP (last 3 results) No results found for this basename: PROBNP,  in the last 8760 hours CBG: No results found for this basename: GLUCAP,  in the last 168 hours  Recent Results (from the past 240 hour(s))  CULTURE, BLOOD (ROUTINE X 2)     Status: None   Collection Time    08/08/12 10:30 AM      Result Value Range Status   Specimen Description BLOOD LEFT HAND   Final   Special Requests BOTTLES DRAWN AEROBIC AND ANAEROBIC 3CC   Final   Culture  Setup Time 08/08/2012 13:53   Final   Culture     Final   Value:        BLOOD CULTURE RECEIVED NO GROWTH TO DATE CULTURE WILL BE HELD FOR 5 DAYS BEFORE ISSUING A FINAL  NEGATIVE REPORT   Report Status PENDING   Incomplete  CULTURE, BLOOD (ROUTINE X 2)     Status: None   Collection Time    08/08/12 10:45 AM      Result Value Range Status   Specimen Description BLOOD RIGHT ARM   Final   Special Requests BOTTLES DRAWN AEROBIC AND ANAEROBIC [facility]   Final   Culture  Setup Time 08/08/2012 13:52   Final   Culture     Final   Value:        BLOOD CULTURE RECEIVED NO GROWTH TO DATE CULTURE WILL BE HELD FOR 5 DAYS BEFORE ISSUING A FINAL NEGATIVE REPORT   Report Status PENDING   Incomplete  CLOSTRIDIUM DIFFICILE BY PCR     Status: None   Collection Time    08/08/12  7:11 PM      Result Value Range Status   C difficile by pcr NEGATIVE  NEGATIVE Final  URINE CULTURE     Status: None   Collection Time    08/09/12  8:35 AM      Result Value Range Status   Specimen Description URINE, CLEAN CATCH   Final   Special Requests NONE   Final   Culture  Setup Time 08/09/2012 12:38   Final   Colony Count 25,000 COLONIES/ML   Final   Culture     Final   Value: Multiple bacterial morphotypes  present, none predominant. Suggest appropriate recollection if clinically indicated.   Report Status 08/10/2012 FINAL   Final     Studies: US Abdomen Complete  08/09/2012  *RADIOLOGY REPORT*  Clinical Data:  Mid abdominal and chest pain.  COMPLETE ABDOMINAL ULTRASOUND  Comparison:  08/07/2012 chest CT.  10/19/2011 ultrasound.  Findings:  Gallbladder:  Diffuse gallbladder wall thickening with mild pericholecystic fluid.  The patient was tender over this region during scanning.  This raises possibility of cholecystitis.  Other causes of gallbladder wall thickening such as metabolic abnormality or congestive heart failure felt to be secondary considerations. Clinical correlation recommended.  Common bile duct:  .  6.7 mm peri  Liver:  Enlarged and echogenic suggestive of diffuse fatty infiltration.  No obvious focal mass  IVC:  Limited by bowel gas.  Pancreas:  Limited by bowel gas.  Spleen:  Enlarged spanning over 15.7 cm with volume of 854.6 ml.  Right Kidney:  10.6 cm. No hydronephrosis or renal mass.  Left Kidney:  10.3 cm. No hydronephrosis or renal mass.  Abdominal aorta:  Limited y bowel gas.  Maximal AP dimension obtained 1.7 cm.,  IMPRESSION: Diffuse gallbladder wall thickening with mild pericholecystic fluid.  The patient was tender over this region during scanning. This raises possibility of cholecystitis.  Enlarged fatty liver.  Splenomegaly.  Limited evaluation of the inferior vena cava, pancreas and aorta secondary to bowel gas.  Please see above.  This has been made a PRA call report utilizing dashboard call feature. .   Original Report Authenticated By: Lacy Duverney, M.D.    Nm Hepato W/eject Fract  08/10/2012  *RADIOLOGY REPORT*  Clinical Data: Right upper quadrant pain and nausea.  Abnormal gallbladder ultrasound.  NUCLEAR MEDICINE HEPATOBILIARY WITH GB, PHARM AND QUAN MEASURE  Radiopharmaceutical:  5.4 mCi technetium Choletec IV.  8 ounces of Ensure orally.  Comparison: Ultrasound dated  08/09/2012  Findings: The gallbladder is visualized at 20 minutes.  There is activity in the bowel immediately after Ensure ingestion.  Ejection fraction was 47.2% at 59 minutes.  IMPRESSION: Normal hepatobiliary scan.  Normal ejection fraction.   Original Report Authenticated By: Francene Boyers, M.D.      Additional labs:   Scheduled Meds: . sodium chloride   Intravenous Once  . docusate sodium  100 mg Oral BID  . folic acid  1 mg Oral Daily  . metoprolol tartrate  25 mg Oral BID  . pantoprazole  40 mg Oral Daily  . sodium chloride  3 mL Intravenous Q12H  . thiamine  100 mg Oral Daily   Continuous Infusions:    Active Problems:   GERD (gastroesophageal reflux disease)   Alcohol abuse   Alcoholic hepatitis   Dehydration   Tachycardia   Orthostatic hypotension   Hypokalemia    Time spent: 45 minutes.    Renae Fickle  [facility] Pager [phone].   If 8PM-8AM, please contact night-coverage at [email or web address], password [facility] 08/10/2012, 10:25 AM  LOS: 4 days

## 2012-08-10 NOTE — Progress Notes (Signed)
Pt. Stated that she vomited while using the bathroom.

## 2012-08-10 NOTE — Progress Notes (Signed)
Splenomegaly on [place] and MRCP in setting of abdominal pain and fevers.  Will add EBV titers to AM labs.

## 2012-08-10 NOTE — Consult Note (Signed)
Reason for Consult:Abdominal pain Referring Physician: Hospital team  Kara Stewart is an 37 y.o. female.  HPI: Patient seen at her request of the hospital team for abdominal pain elevated liver tests and history of alcoholic liver disease and she had an endoscopy about 8 months ago at Another hospital and that report was reviewed and she said she's been hurting ever since It just got worse And she's been having some increased nausea vomiting and low-grade fevers and she has continued to drink and she has no lower bowel complaints and only sees blood if she gets constipated and has to strain however she has had long-standing gas and bloating and did have an upper GI 10 years ago which showed a hiatal hernia and her family history is pertinent for 1 grandfather with stomach and colon cancer And her workupWas reviewed and we discussed complete cessation of alcohol Past Medical History  Diagnosis Date  . SVT (supraventricular tachycardia)   . Hiatal hernia   . Anxiety   . Acid reflux   . Dyspepsia 2004    Dx w/ PUD but no EGD, [place], [place]   . Hypertriglyceridemia   . Hypothyroid   . Iritis     frequent  . Hypertension   . Asthma   . Panic attacks     Past Surgical History  Procedure Laterality Date  . Esophagogastroduodenoscopy  11/06/2011    [name]: MILD Esophagitis/PATENT ESOPHAGEAL Stricture/  Moderate gastritis. Bx no.hpylori or celiac, +gastritis    Family History  Problem Relation Age of Onset  . Stomach cancer Paternal Grandfather   . Breast cancer Maternal Grandmother     Social History:  reports that she has never smoked. She does not have any smokeless tobacco history on file. She reports that she does not drink alcohol or use illicit drugs.  Allergies:  Allergies  Allergen Reactions  . Penicillins Anaphylaxis    Medications: I have reviewed the patient's current medications.  Results for orders placed during the hospital encounter of 08/06/12 (from the past 48 hour(s))   CLOSTRIDIUM DIFFICILE BY PCR     Status: None   Collection Time    08/08/12  7:11 PM      Result Value Range   C difficile by pcr NEGATIVE  NEGATIVE  CBC     Status: Abnormal   Collection Time    08/09/12  4:15 AM      Result Value Range   WBC 3.6 (*) 4.0 - 10.5 K/uL   RBC 3.50 (*) 3.87 - 5.11 MIL/uL   Hemoglobin 12.2  12.0 - 15.0 g/dL   HCT 16.1  09.6 - 04.5 %   MCV 104.3 (*) 78.0 - 100.0 fL   MCH 34.9 (*) 26.0 - 34.0 pg   MCHC 33.4  30.0 - 36.0 g/dL   RDW 40.9  81.1 - 91.4 %   Platelets 109 (*) 150 - 400 K/uL   Comment: CONSISTENT WITH PREVIOUS RESULT  COMPREHENSIVE METABOLIC PANEL     Status: Abnormal   Collection Time    08/09/12  4:15 AM      Result Value Range   Sodium 135  135 - 145 mEq/L   Potassium 3.7  3.5 - 5.1 mEq/L   Chloride 104  96 - 112 mEq/L   CO2 23  19 - 32 mEq/L   Glucose, Bld 91  70 - 99 mg/dL   BUN <3 (*) 6 - 23 mg/dL   Comment: REPEATED TO VERIFY   Creatinine, Ser 0.72  0.50 - 1.10 mg/dL   Calcium 8.1 (*) 8.4 - 10.5 mg/dL   Total Protein 5.4 (*) 6.0 - 8.3 g/dL   Albumin 2.0 (*) 3.5 - 5.2 g/dL   AST 086 (*) 0 - 37 U/L   ALT 127 (*) 0 - 35 U/L   Alkaline Phosphatase 121 (*) 39 - 117 U/L   Total Bilirubin 1.8 (*) 0.3 - 1.2 mg/dL   GFR calc non Af Amer >90  >90 mL/min   GFR calc Af Amer >90  >90 mL/min   Comment:            The eGFR has been calculated     using the CKD EPI equation.     This calculation has not been     validated in all clinical     situations.     eGFR's persistently     <90 mL/min signify     possible Chronic Kidney Disease.  LIPASE, BLOOD     Status: None   Collection Time    08/09/12  4:15 AM      Result Value Range   Lipase 26  11 - 59 U/L  URINE CULTURE     Status: None   Collection Time    08/09/12  8:35 AM      Result Value Range   Specimen Description URINE, CLEAN CATCH     Special Requests NONE     Culture  Setup Time 08/09/2012 12:38     Colony Count 25,000 COLONIES/ML     Culture       Value: Multiple  bacterial morphotypes present, none predominant. Suggest appropriate recollection if clinically indicated.   Report Status 08/10/2012 FINAL    COMPREHENSIVE METABOLIC PANEL     Status: Abnormal   Collection Time    08/10/12  4:57 AM      Result Value Range   Sodium 133 (*) 135 - 145 mEq/L   Potassium 4.4  3.5 - 5.1 mEq/L   Comment: MODERATE HEMOLYSIS   Chloride 100  96 - 112 mEq/L   CO2 25  19 - 32 mEq/L   Glucose, Bld 84  70 - 99 mg/dL   BUN 3 (*) 6 - 23 mg/dL   Creatinine, Ser 5.78  0.50 - 1.10 mg/dL   Calcium 8.1 (*) 8.4 - 10.5 mg/dL   Total Protein 5.3 (*) 6.0 - 8.3 g/dL   Albumin 2.0 (*) 3.5 - 5.2 g/dL   AST 469 (*) 0 - 37 U/L   ALT 106 (*) 0 - 35 U/L   Alkaline Phosphatase 103  39 - 117 U/L   Total Bilirubin 2.1 (*) 0.3 - 1.2 mg/dL   GFR calc non Af Amer >90  >90 mL/min   GFR calc Af Amer >90  >90 mL/min   Comment:            The eGFR has been calculated     using the CKD EPI equation.     This calculation has not been     validated in all clinical     situations.     eGFR's persistently     <90 mL/min signify     possible Chronic Kidney Disease.  MAGNESIUM     Status: None   Collection Time    08/10/12  4:57 AM      Result Value Range   Magnesium 2.1  1.5 - 2.5 mg/dL  PHOSPHORUS     Status: None   Collection Time  08/10/12  4:57 AM      Result Value Range   Phosphorus 3.2  2.3 - 4.6 mg/dL  LIPASE, BLOOD     Status: None   Collection Time    08/10/12  4:57 AM      Result Value Range   Lipase 19  11 - 59 U/L  CBC WITH DIFFERENTIAL     Status: Abnormal   Collection Time    08/10/12  4:57 AM      Result Value Range   WBC 4.7  4.0 - 10.5 K/uL   RBC 3.49 (*) 3.87 - 5.11 MIL/uL   Hemoglobin 12.2  12.0 - 15.0 g/dL   HCT 16.1  09.6 - 04.5 %   MCV 104.3 (*) 78.0 - 100.0 fL   MCH 35.0 (*) 26.0 - 34.0 pg   MCHC 33.5  30.0 - 36.0 g/dL   RDW 40.9  81.1 - 91.4 %   Platelets 121 (*) 150 - 400 K/uL   Neutrophils Relative 25 (*) 43 - 77 %   Lymphocytes Relative 65  (*) 12 - 46 %   Monocytes Relative 7  3 - 12 %   Eosinophils Relative 1  0 - 5 %   Basophils Relative 2 (*) 0 - 1 %   Neutro Abs 1.2 (*) 1.7 - 7.7 K/uL   Lymphs Abs 3.1  0.7 - 4.0 K/uL   Monocytes Absolute 0.3  0.1 - 1.0 K/uL   Eosinophils Absolute 0.0  0.0 - 0.7 K/uL   Basophils Absolute 0.1  0.0 - 0.1 K/uL   WBC Morphology ATYPICAL LYMPHOCYTES     Comment: INCREASED BANDS (>20% BANDS)   Smear Review PLATELET CLUMPS NOTED ON SMEAR     Comment: PLATELETS APPEAR ADEQUATE    US Abdomen Complete  08/09/2012  *RADIOLOGY REPORT*  Clinical Data:  Mid abdominal and chest pain.  COMPLETE ABDOMINAL ULTRASOUND  Comparison:  08/07/2012 chest CT.  10/19/2011 ultrasound.  Findings:  Gallbladder:  Diffuse gallbladder wall thickening with mild pericholecystic fluid.  The patient was tender over this region during scanning.  This raises possibility of cholecystitis.  Other causes of gallbladder wall thickening such as metabolic abnormality or congestive heart failure felt to be secondary considerations. Clinical correlation recommended.  Common bile duct:  .  6.7 mm peri  Liver:  Enlarged and echogenic suggestive of diffuse fatty infiltration.  No obvious focal mass  IVC:  Limited by bowel gas.  Pancreas:  Limited by bowel gas.  Spleen:  Enlarged spanning over 15.7 cm with volume of 854.6 ml.  Right Kidney:  10.6 cm. No hydronephrosis or renal mass.  Left Kidney:  10.3 cm. No hydronephrosis or renal mass.  Abdominal aorta:  Limited y bowel gas.  Maximal AP dimension obtained 1.7 cm.,  IMPRESSION: Diffuse gallbladder wall thickening with mild pericholecystic fluid.  The patient was tender over this region during scanning. This raises possibility of cholecystitis.  Enlarged fatty liver.  Splenomegaly.  Limited evaluation of the inferior vena cava, pancreas and aorta secondary to bowel gas.  Please see above.  This has been made a PRA call report utilizing dashboard call feature. .   Original Report Authenticated By:  Lacy Duverney, M.D.    Nm Hepato W/eject Fract  08/10/2012  *RADIOLOGY REPORT*  Clinical Data: Right upper quadrant pain and nausea.  Abnormal gallbladder ultrasound.  NUCLEAR MEDICINE HEPATOBILIARY WITH GB, PHARM AND QUAN MEASURE  Radiopharmaceutical:  5.4 mCi technetium Choletec IV.  8 ounces of Ensure orally.  Comparison: Ultrasound dated 08/09/2012  Findings: The gallbladder is visualized at 20 minutes.  There is activity in the bowel immediately after Ensure ingestion.  Ejection fraction was 47.2% at 59 minutes.  IMPRESSION: Normal hepatobiliary scan.  Normal ejection fraction.   Original Report Authenticated By: Francene Boyers, M.D.     [facility] except above Blood pressure 98/58, pulse 122, temperature 100.4 F (38 C), temperature source Oral, resp. rate 18, height 5\' 4"  (1.626 m), weight 89.404 kg (197 lb 1.6 oz), last menstrual period 03/06/2012, SpO2 95.00%. Physical ExamSigns stable Low-grade temp no acute distress Sclerae notIcteric abdomen is soft no guarding or rebound adequate bowels sounds labs and x-rays reviewed  Assessment/Plan: Abdominal pain nausea vomiting low-grade fever questionable gallstones versus alcoholic hepatitis Plan: I discussed the case with the hospital team and will wait on an MRCP to rule out CBD stone and also get an additional picture of the gallbladder with further workup and plans pending those findings  Kanton Kamel E 08/10/2012, 2:05 PM

## 2012-08-10 NOTE — Progress Notes (Signed)
Subjective: Complaining of mid epigastric pain and vomiting after taking some Ensure.    Objective: Vital signs in last 24 hours: Temp:  [98.5 F (36.9 C)-100.4 F (38 C)] 100.4 F (38 C) (05/07 0517) Pulse Rate:  [95-122] 122 (05/07 0522) Resp:  [18-20] 18 (05/07 0517) BP: (98-113)/(58-85) 98/58 mmHg (05/07 0522) SpO2:  [95 %-98 %] 95 % (05/07 0517) Last BM Date: 08/09/12 TM 100.4, VSS, Labs stable, lipase normal, platelets a little better, PT. PTT pending HIDA: Normal SCAN with normal EF.  Intake/Output from previous day:   Intake/Output this shift:    General appearance: alert, cooperative and complaining of mid epigastric pain, and vomiting. GI: soft, major source of discomfort is mid epigastric below xyphoid.  Mild tenderness all over, but no worse in RUQ than LLQ. No distension, guarding or rebound.  Lab Results:   Recent Labs  08/09/12 0415 08/10/12 0457  WBC 3.6* 4.7  HGB 12.2 12.2  HCT 36.5 36.4  PLT 109* 121*    BMET  Recent Labs  08/09/12 0415 08/10/12 0457  NA 135 133*  K 3.7 4.4  CL 104 100  CO2 23 25  GLUCOSE 91 84  BUN <3* 3*  CREATININE 0.72 0.79  CALCIUM 8.1* 8.1*   PT/INR No results found for this basename: LABPROT, INR,  in the last 72 hours   Recent Labs Lab 08/06/12 1845 08/07/12 0625 08/08/12 0420 08/09/12 0415 08/10/12 0457  AST 161* 133* 256* 239* 152*  ALT 85* 64* 105* 127* 106*  ALKPHOS 118* 90 110 121* 103  BILITOT 1.4* 1.5* 1.8* 1.8* 2.1*  PROT 6.8 5.2* 5.0* 5.4* 5.3*  ALBUMIN 2.8* 2.0* 2.0* 2.0* 2.0*     Lipase     Component Value Date/Time   LIPASE 19 08/10/2012 0457     Studies/Results: US Abdomen Complete  08/09/2012  *RADIOLOGY REPORT*  Clinical Data:  Mid abdominal and chest pain.  COMPLETE ABDOMINAL ULTRASOUND  Comparison:  08/07/2012 chest CT.  10/19/2011 ultrasound.  Findings:  Gallbladder:  Diffuse gallbladder wall thickening with mild pericholecystic fluid.  The patient was tender over this region  during scanning.  This raises possibility of cholecystitis.  Other causes of gallbladder wall thickening such as metabolic abnormality or congestive heart failure felt to be secondary considerations. Clinical correlation recommended.  Common bile duct:  .  6.7 mm peri  Liver:  Enlarged and echogenic suggestive of diffuse fatty infiltration.  No obvious focal mass  IVC:  Limited by bowel gas.  Pancreas:  Limited by bowel gas.  Spleen:  Enlarged spanning over 15.7 cm with volume of 854.6 ml.  Right Kidney:  10.6 cm. No hydronephrosis or renal mass.  Left Kidney:  10.3 cm. No hydronephrosis or renal mass.  Abdominal aorta:  Limited y bowel gas.  Maximal AP dimension obtained 1.7 cm.,  IMPRESSION: Diffuse gallbladder wall thickening with mild pericholecystic fluid.  The patient was tender over this region during scanning. This raises possibility of cholecystitis.  Enlarged fatty liver.  Splenomegaly.  Limited evaluation of the inferior vena cava, pancreas and aorta secondary to bowel gas.  Please see above.  This has been made a PRA call report utilizing dashboard call feature. .   Original Report Authenticated By: Lacy Duverney, M.D.     Medications: . sodium chloride   Intravenous Once  . docusate sodium  100 mg Oral BID  . folic acid  1 mg Oral Daily  . metoprolol tartrate  25 mg Oral BID  . pantoprazole  40 mg Oral Daily  . sodium chloride  3 mL Intravenous Q12H  . thiamine  100 mg Oral Daily    Assessment/Plan 1. Chest pain with abnormal GB on [place].  2. Alcohol dependence with withdrawal  3. EGD and documented gastritis and esophageal stricture, 11/06/11  4. Chronic LFT elevations.  5. Fatty liver and splenomegaly  6. Thrombocytopenia  7. Dyslipidemia  8. Anxiety/panic attack  9. Hypertension  10. History of SVT, evaluation by Dr. Tresa Endo; on PRN metoprolol.  11.Hypothyroid   Plan:  Await GI input, normal HIDA scan this AM. Exam is unchanged.  LOS: 4 days    Alasia Enge 08/10/2012

## 2012-08-10 NOTE — Progress Notes (Signed)
General surgery attending note:  The patient interviewed and examined this morning. I agree with the assessment and treatment plan outlined by Mr. Marlyne Beards, [place].  The patient continues to complain of epigastric pain and vomited once after the biliary scan.Had a normal bowel movement this morning. Abdominal exam reveals obesity, tenderness with mild guarding in the midepigastrium, not lateralizing. Not distended.  Lipase is normal x2. Abnormal liver functions persist, no worse, alkaline phosphatase normal, elevated transaminases and bilirubin. Hepatobiliary scan shows normal gallbladder function and normal ejection fraction.  Assessment: 1)  Epigastric pain and nausea of uncertain etiology. Clinically and radiographically there is no evidence for acute cholecystitis or gallstones. There is no indication for cholecystectomy. Pancreatitis is felt to be unlikely. This could be due to esophagogastric pathology.Mesenteric ischemia and thromboembolic disease seem less likely.  2) History chronic alcohol abuse and recent alcohol detox.  3) Hepatomegaly and splenomegaly and neutropenia and thrombocytopenia, probably secondary to alcohol abuse  4) Abnormal liver function tests, chronic. Hepatocellular disease seems more likely than biliary tract disease, but both need to be considered.  5) History of SVT tachycardia. Previously evaluated Aleen Campi.  6)History GERD, esophageal dilatation, gastritis  7) Asthma  8) Hypertension  9) Family history of cholecystectomy in mother, grandmother, great aunt according to patient.   Recommendations: GI consultation for evaluation of epigastric pain, possible esophagogastric pathology, and hepatocellular disease If EGD is unremarkable, consideration could be given to performing CT scan of abdomen and pelvis with contrast We have nothing further to add at this point, so surgery will sign off. Please reconsult if surgical problems arise.   Angelia Mould. Derrell Lolling,  M.D., [facility] General and Minimally invasive Surgery Breast and Colorectal Surgery Office:   [phone] Pager:   [phone]

## 2012-08-10 NOTE — ED Provider Notes (Signed)
Medical screening examination/treatment/procedure(s) were performed by non-physician practitioner and as supervising physician I was immediately available for consultation/collaboration.  Evangelyne Loja R. Sione Baumgarten, MD 08/10/12 0804 

## 2012-08-11 DIAGNOSIS — K7010 Alcoholic hepatitis without ascites: Principal | ICD-10-CM

## 2012-08-11 DIAGNOSIS — B2790 Infectious mononucleosis, unspecified without complication: Secondary | ICD-10-CM | POA: Diagnosis present

## 2012-08-11 LAB — COMPREHENSIVE METABOLIC PANEL
ALT: 94 U/L — ABNORMAL HIGH (ref 0–35)
AST: 130 U/L — ABNORMAL HIGH (ref 0–37)
Albumin: 1.9 g/dL — ABNORMAL LOW (ref 3.5–5.2)
Calcium: 8.4 mg/dL (ref 8.4–10.5)
GFR calc Af Amer: >90 mL/min (ref >90)
Glucose, Bld: 89 mg/dL (ref 70–99)
Sodium: 134 mEq/L — ABNORMAL LOW (ref 135–145)
Total Protein: 5.3 g/dL — ABNORMAL LOW (ref 6.0–8.3)

## 2012-08-11 MED ORDER — LEVALBUTEROL HCL 1.25 MG/0.5ML IN NEBU
1.2500 mg | INHALATION_SOLUTION | Freq: Four times a day (QID) | RESPIRATORY_TRACT | Status: DC | PRN
  Filled 2012-08-11: qty 0.5

## 2012-08-11 MED ORDER — METOPROLOL TARTRATE 25 MG PO TABS
25.0000 mg | ORAL_TABLET | Freq: Three times a day (TID) | ORAL | Status: DC
  Administered 2012-08-11: 25 mg via ORAL
  Filled 2012-08-11 (×4): qty 1

## 2012-08-11 NOTE — Progress Notes (Signed)
Subjective: Still with rapid HR, dizzy when stands, + nausea continues though able to take full liquids.  Objective: Vital signs in last 24 hours: Temp:  [98 F (36.7 C)-100.1 F (37.8 C)] 98 F (36.7 C) (05/08 1332) Pulse Rate:  [106-122] 107 (05/08 1332) Resp:  [18-20] 18 (05/08 1332) BP: (100-126)/(63-77) 106/63 mmHg (05/08 1332) SpO2:  [95 %-100 %] 100 % (05/08 1332) Weight change:  Last BM Date: 08/11/12 Intake/Output from previous day: +215 05/07 0701 - 05/08 0700 In: 335 [P.O.:335] Out: -  Intake/Output this shift: Total I/O In: 120 [P.O.:120] Out: -   PE: General:alert and oriented, Flat affect Heart:S1S2 RRR, somewhat fast. Lungs:clear with few rhonchi AVW:UJWJX, soft,non tender + BS Ext:no edema, 2 + pedal pulses  Tele:  HR up to 130 at times ST    Lab Results:  Recent Labs  08/09/12 0415 08/10/12 0457  WBC 3.6* 4.7  HGB 12.2 12.2  HCT 36.5 36.4  PLT 109* 121*   BMET  Recent Labs  08/10/12 0457 08/11/12 0530  NA 133* 134*  K 4.4 3.9  CL 100 102  CO2 25 25  GLUCOSE 84 89  BUN 3* 4*  CREATININE 0.79 0.86  CALCIUM 8.1* 8.4   No results found for this basename: TROPONINI, CK, MB,  in the last 72 hours  No results found for this basename: CHOL, HDL, LDLCALC, LDLDIRECT, TRIG, CHOLHDL   No results found for this basename: HGBA1C     Lab Results  Component Value Date   TSH 2.525 08/07/2012    Hepatic Function Panel  Recent Labs  08/11/12 0530  PROT 5.3*  ALBUMIN 1.9*  AST 130*  ALT 94*  ALKPHOS 116  BILITOT 2.3*     Studies/Results: Mr 3d Recon At Scanner  08/10/2012  *RADIOLOGY REPORT*  Clinical Data:  Severe abdominal pain and nausea.  MRI ABDOMEN WITHOUT AND WITH CONTRAST (MRCP)  Technique:  Multiplanar multisequence MR imaging of the abdomen was performed without and with contrast, including heavily T2-weighted images of the biliary and pancreatic ducts.  Three-dimensional MR images were rendered by post processing of the original  MR data.  Contrast: 20mL MULTIHANCE GADOBENATE DIMEGLUMINE 529 MG/ML IV SOLN  Comparison:  Ultrasound 08/09/2012.  Findings:  The liver demonstrates diffuse fatty infiltration but no focal hepatic lesion or intrahepatic ductal dilatation.  No definite findings for cirrhosis.  No focal hepatic lesions or intrahepatic biliary dilatation.  The common bile duct is normal in caliber and has a normal course.  The pancreatic duct is normal.  There is diffuse wall thickening and pericholecystic fluid involving the gallbladder as noted on the previous ultrasound.  No obvious explanation.  Recommend correlation with albumin level. Small pleural effusions are noted.  There is a small amount of free abdominal fluid also.  Significant splenomegaly is demonstrated.  The spleen measures 17.5 x 16.0 x 11.0 cm.  The pancreas is normal.  The adrenal glands and kidneys are normal.  There are small scattered mesenteric and retroperitoneal lymph nodes but no mass or adenopathy.  A small amount of body wall edema is noted.  IMPRESSION:  1.  Pericholecystic fluid, small amount of abdominal ascites and mild body wall edema would suggest a systemic cause.  Recommend correlation with liver function studies,  albumin level and total protein. 2.  No gallstones or common bile duct stones.  The common bile duct and pancreatic ducts are normal. 3.  Marked splenomegaly.   Original Report Authenticated By: Rudie Meyer, M.D.  Nm Hepato W/eject Fract  08/10/2012  *RADIOLOGY REPORT*  Clinical Data: Right upper quadrant pain and nausea.  Abnormal gallbladder ultrasound.  NUCLEAR MEDICINE HEPATOBILIARY WITH GB, PHARM AND QUAN MEASURE  Radiopharmaceutical:  5.4 mCi technetium Choletec IV.  8 ounces of Ensure orally.  Comparison: Ultrasound dated 08/09/2012  Findings: The gallbladder is visualized at 20 minutes.  There is activity in the bowel immediately after Ensure ingestion.  Ejection fraction was 47.2% at 59 minutes.  IMPRESSION: Normal  hepatobiliary scan.  Normal ejection fraction.   Original Report Authenticated By: Francene Boyers, M.D.    Mr Abd W/wo Cm/mrcp  08/10/2012  *RADIOLOGY REPORT*  Clinical Data:  Severe abdominal pain and nausea.  MRI ABDOMEN WITHOUT AND WITH CONTRAST (MRCP)  Technique:  Multiplanar multisequence MR imaging of the abdomen was performed without and with contrast, including heavily T2-weighted images of the biliary and pancreatic ducts.  Three-dimensional MR images were rendered by post processing of the original MR data.  Contrast: 20mL MULTIHANCE GADOBENATE DIMEGLUMINE 529 MG/ML IV SOLN  Comparison:  Ultrasound 08/09/2012.  Findings:  The liver demonstrates diffuse fatty infiltration but no focal hepatic lesion or intrahepatic ductal dilatation.  No definite findings for cirrhosis.  No focal hepatic lesions or intrahepatic biliary dilatation.  The common bile duct is normal in caliber and has a normal course.  The pancreatic duct is normal.  There is diffuse wall thickening and pericholecystic fluid involving the gallbladder as noted on the previous ultrasound.  No obvious explanation.  Recommend correlation with albumin level. Small pleural effusions are noted.  There is a small amount of free abdominal fluid also.  Significant splenomegaly is demonstrated.  The spleen measures 17.5 x 16.0 x 11.0 cm.  The pancreas is normal.  The adrenal glands and kidneys are normal.  There are small scattered mesenteric and retroperitoneal lymph nodes but no mass or adenopathy.  A small amount of body wall edema is noted.  IMPRESSION:  1.  Pericholecystic fluid, small amount of abdominal ascites and mild body wall edema would suggest a systemic cause.  Recommend correlation with liver function studies,  albumin level and total protein. 2.  No gallstones or common bile duct stones.  The common bile duct and pancreatic ducts are normal. 3.  Marked splenomegaly.   Original Report Authenticated By: Rudie Meyer, M.D.    2D  Echo: Procedure narrative: Transthoracic echocardiography. Technically difficult study with suboptimal image quality. - Left ventricle: The cavity size was normal. Wall thickness was normal. Systolic function was vigorous. The estimated ejection fraction was in the range of 65% to 70%. Images were inadequate for LV wall motion assessment. Doppler parameters are consistent with abnormal left ventricular relaxation (grade 1 diastolic dysfunction). The E/e' ratio is <10, suggesting normal LV filling pressure. - Left atrium: The atrium was normal in size.   Medications: I have reviewed the patient's current medications. Scheduled Meds: . sodium chloride   Intravenous Once  . docusate sodium  100 mg Oral BID  . folic acid  1 mg Oral Daily  . metoprolol tartrate  25 mg Oral BID  . pantoprazole  40 mg Oral BID  . sodium chloride  3 mL Intravenous Q12H  . sucralfate  1 g Oral TID WC & HS  . thiamine  100 mg Oral Daily   Continuous Infusions:  PRN Meds:.acetaminophen, acetaminophen, albuterol, guaiFENesin, LORazepam, ondansetron (ZOFRAN) IV, ondansetron, oxyCODONE  Assessment/Plan: Active Problems:   Elevated LFTs   Fatty liver   GERD (gastroesophageal reflux disease)  Alcohol abuse   Alcoholic hepatitis   Dehydration   Tachycardia   Orthostatic hypotension   Hypokalemia  PLAN: LFTs coming down, negative troponin, see GI note.   Pt with H/O ETOH use admitted with increased HR, abd/CP. Neg CTA. Enz neg. Mild increased LFTs. GI following. She has been on PRN BB in past prescribed by Dr. Tresa Endo for SVT. 2D Nl. Exam benign. Per Dr. Allyson Sabal "Doubt cardiac cause of CP. Recommend Lopressor BID indefinitely."   HR 122 to 99.  Higher with fever.     LOS: 5 days   Time spent with pt. :15 minutes. [facility] R  Nurse Practitioner Certified Pager [phone] 08/11/2012, 3:31 PM   Patient seen and examined. Agree with assessment and plan. No chest pain or shortness of breath. Mild RUQ  tenderness and palpable spleen tip.Normal systolic function. Will try to increase lopressor to 25 mg every 8 hrs for improved rate control. Monitor orthostatic BP checks.   Lennette Bihari, MD, [facility] 08/11/2012 6:46 PM

## 2012-08-11 NOTE — Progress Notes (Signed)
[name] 11:23 AM  Subjective: Patient with multiple complaints and was able to eat a little clear liquids and her pain seems to be worse when she sits up or stand up and she complains of dizziness when she does that as well  Objective: Vital signs stable low-grade temp no acute distress MRCP reviewed and discussed Assessment: Alcoholic hepatitis  Plan: We have long talk about alcoholic hepatitis and that is probably the root of her issues and she needs time and abstinence and we reviewed her previous endoscopy and I do not think she needs another since her symptoms do not sound like they are coming from her hiatal hernia since that should be better when she sits up and stands up and we discussed her big spleen is coming from her liver disease and hopefully if she quits things will resolve otherwise medical team to rule out other causes of fever and let me know if I can be of any further assistance  [facility] E

## 2012-08-11 NOTE — Progress Notes (Addendum)
[facility] PROGRESS NOTE  Kara Stewart ZOX:[id number] DOB: 1975-08-06 DOA: 08/06/2012 PCP: No PCP Per Patient   Assessment/Plan:  Dehydration: Present on admission, resolved after IV fluids. Orthostatic hypotension, symptomatic: BP dropped from 115/65 > 98/45 on 5/5 AM. Unclear etiology. Clinically euvolemic. Cortisol 11.1. 2-D echo with grade 1 diastolic dysfunction.  -  Persistent symptomatic orthostasis -  TSH 2.525 -  Place TED hose -  Advance diet to increase salt in diet  Alcoholic hepatitis with persistent abdominal discomfort    -  Acute hepatitis panel:  negative -  RUQ ultrasound:? Acute cholecystitis, but HIDA negative -  MRCP demonstrated generalized gut edema without evidence of gallstones. -   Continue PPI  -   Advance diet  Possible mononucleosis:  May be contributing to fevers, malaise, poor appetite and splenomegaly -  DNA titer to confirm  Splenomegaly, likely due to alcoholic hepatitis and liver disease and possibly active EBV infection.    Tachycardia with history of SVT:  -  Continue metoprolol indefinitely per cardiology, appreciate assistance -  TSH wnl  Low-grade fevers:  CTa negative for pneumonia and PE.  None recently.   -   Blood cx NGTD -  Urine culture Neg -  C. difficile PCR negative  GERD: Continue PPI Alcohol dependence:  Counseled cessation Hypothyroidism: TSH 2.525. Hypertension: Controlled Leukopenia, thrombocytopenia and macrocytosis: Likely secondary to alcohol abuse and splenomegaly. Stable.   Right middle lobe 3 mm lung nodule on CT chest:  Denies smoking.  No follow up necessary   Code Status: Full Family Communication:  discussed with patient. Disposition Plan:  Possible home tomorrow if tolerating some food.   Consultants:  General surgery  Cardiology  GI  Procedures:  None  Antibiotics:  None   HPI/Subjective: Continues to feel dizzy when she stands up.  Nausea and full feeling persist with poor  appetite.     Objective: Filed Vitals:   08/11/12 0540 08/11/12 0543 08/11/12 0545 08/11/12 1332  BP: 102/65 126/77 118/74 106/63  Pulse: 106 116 122 107  Temp: 100.1 F (37.8 C)   98 F (36.7 C)  TempSrc: Oral   Oral  Resp: 20   18  Height:      Weight:      SpO2: 95%   100%    Intake/Output Summary (Last 24 hours) at 08/11/12 1824 Last data filed at 08/11/12 1700  Gross per 24 hour  Intake    600 ml  Output      0 ml  Net    600 ml   Filed Weights   08/07/12 0439  Weight: 89.404 kg (197 lb 1.6 oz)    Exam:   General exam: CF, NAD  HEENT:  MMM NCAT  Respiratory system: Clear. No increased work of breathing.  Cardiovascular system:  Tachycardic, regular rhythm, nl S1 & S2 heard, no mrg.  Telemetry: Sinus tachycardia in the 110s  Gastrointestinal system:  NABS, soft, mildly distended and gassy, nontender  Central nervous system: Alert and oriented. No focal neurological deficits.    Extremities: Symmetric 5 x 5 power. Not tremulous.    Data Reviewed: Basic Metabolic Panel:  Recent Labs Lab 08/06/12 1845 08/07/12 0625 08/08/12 0420 08/09/12 0415 08/10/12 0457 08/11/12 0530  NA 131* 134* 136 135 133* 134*  K 3.3* 3.5 3.2* 3.7 4.4 3.9  CL 93* 106 107 104 100 102  CO2 23 19 22 23 25 25   GLUCOSE 76 110* 100* 91 84 89  BUN 6 6 3* <  3* 3* 4*  CREATININE 0.83 0.74 0.80 0.72 0.79 0.86  CALCIUM 8.6 6.9* 7.2* 8.1* 8.1* 8.4  MG  --  1.5 1.6  --  2.1  --   PHOS  --  1.7*  --   --  3.2  --    Liver Function Tests:  Recent Labs Lab 08/07/12 0625 08/08/12 0420 08/09/12 0415 08/10/12 0457 08/11/12 0530  AST 133* 256* 239* 152* 130*  ALT 64* 105* 127* 106* 94*  ALKPHOS 90 110 121* 103 116  BILITOT 1.5* 1.8* 1.8* 2.1* 2.3*  PROT 5.2* 5.0* 5.4* 5.3* 5.3*  ALBUMIN 2.0* 2.0* 2.0* 2.0* 1.9*    Recent Labs Lab 08/09/12 0415 08/10/12 0457  LIPASE 26 19   No results found for this basename: AMMONIA,  in the last 168 hours CBC:  Recent Labs Lab  08/06/12 1845 08/07/12 0706 08/08/12 0420 08/09/12 0415 08/10/12 0457  WBC 4.8 3.6* 3.1* 3.6* 4.7  NEUTROABS  --   --   --   --  1.2*  HGB 15.2* 12.7 11.4* 12.2 12.2  HCT 42.9 36.5 34.0* 36.5 36.4  MCV 101.9* 102.5* 103.7* 104.3* 104.3*  PLT 167 105* 87* 109* 121*   Cardiac Enzymes: No results found for this basename: CKTOTAL, CKMB, CKMBINDEX, TROPONINI,  in the last 168 hours BNP (last 3 results) No results found for this basename: PROBNP,  in the last 8760 hours CBG: No results found for this basename: GLUCAP,  in the last 168 hours  Recent Results (from the past 240 hour(s))  CULTURE, BLOOD (ROUTINE X 2)     Status: None   Collection Time    08/08/12 10:30 AM      Result Value Range Status   Specimen Description BLOOD LEFT HAND   Final   Special Requests BOTTLES DRAWN AEROBIC AND ANAEROBIC 3CC   Final   Culture  Setup Time 08/08/2012 13:53   Final   Culture     Final   Value:        BLOOD CULTURE RECEIVED NO GROWTH TO DATE CULTURE WILL BE HELD FOR 5 DAYS BEFORE ISSUING A FINAL NEGATIVE REPORT   Report Status PENDING   Incomplete  CULTURE, BLOOD (ROUTINE X 2)     Status: None   Collection Time    08/08/12 10:45 AM      Result Value Range Status   Specimen Description BLOOD RIGHT ARM   Final   Special Requests BOTTLES DRAWN AEROBIC AND ANAEROBIC 6CC   Final   Culture  Setup Time 08/08/2012 13:52   Final   Culture     Final   Value:        BLOOD CULTURE RECEIVED NO GROWTH TO DATE CULTURE WILL BE HELD FOR 5 DAYS BEFORE ISSUING A FINAL NEGATIVE REPORT   Report Status PENDING   Incomplete  CLOSTRIDIUM DIFFICILE BY PCR     Status: None   Collection Time    08/08/12  7:11 PM      Result Value Range Status   C difficile by pcr NEGATIVE  NEGATIVE Final  URINE CULTURE     Status: None   Collection Time    08/09/12  8:35 AM      Result Value Range Status   Specimen Description URINE, CLEAN CATCH   Final   Special Requests NONE   Final   Culture  Setup Time 08/09/2012 12:38    Final   Colony Count 25,000 COLONIES/ML   Final   Culture  Final   Value: Multiple bacterial morphotypes present, none predominant. Suggest appropriate recollection if clinically indicated.   Report Status 09/08/2012 FINAL   Final     Studies: Mr 3d Recon At Scanner  09/08/2012  *RADIOLOGY REPORT*  Clinical Data:  Severe abdominal pain and nausea.  MRI ABDOMEN WITHOUT AND WITH CONTRAST (MRCP)  Technique:  Multiplanar multisequence MR imaging of the abdomen was performed without and with contrast, including heavily T2-weighted images of the biliary and pancreatic ducts.  Three-dimensional MR images were rendered by post processing of the original MR data.  Contrast: 20mL MULTIHANCE GADOBENATE DIMEGLUMINE 529 MG/ML IV SOLN  Comparison:  Ultrasound 08/09/2012.  Findings:  The liver demonstrates diffuse fatty infiltration but no focal hepatic lesion or intrahepatic ductal dilatation.  No definite findings for cirrhosis.  No focal hepatic lesions or intrahepatic biliary dilatation.  The common bile duct is normal in caliber and has a normal course.  The pancreatic duct is normal.  There is diffuse wall thickening and pericholecystic fluid involving the gallbladder as noted on the previous ultrasound.  No obvious explanation.  Recommend correlation with albumin level. Small pleural effusions are noted.  There is a small amount of free abdominal fluid also.  Significant splenomegaly is demonstrated.  The spleen measures 17.5 x 16.0 x 11.0 cm.  The pancreas is normal.  The adrenal glands and kidneys are normal.  There are small scattered mesenteric and retroperitoneal lymph nodes but no mass or adenopathy.  A small amount of body wall edema is noted.  IMPRESSION:  1.  Pericholecystic fluid, small amount of abdominal ascites and mild body wall edema would suggest a systemic cause.  Recommend correlation with liver function studies,  albumin level and total protein. 2.  No gallstones or common bile duct stones.   The common bile duct and pancreatic ducts are normal. 3.  Marked splenomegaly.   Original Report Authenticated By: Rudie Meyer, M.D.    Nm Hepato W/eject Fract  2012-09-08  *RADIOLOGY REPORT*  Clinical Data: Right upper quadrant pain and nausea.  Abnormal gallbladder ultrasound.  NUCLEAR MEDICINE HEPATOBILIARY WITH GB, PHARM AND QUAN MEASURE  Radiopharmaceutical:  5.4 mCi technetium Choletec IV.  8 ounces of Ensure orally.  Comparison: Ultrasound dated 08/09/2012  Findings: The gallbladder is visualized at 20 minutes.  There is activity in the bowel immediately after Ensure ingestion.  Ejection fraction was 47.2% at 59 minutes.  IMPRESSION: Normal hepatobiliary scan.  Normal ejection fraction.   Original Report Authenticated By: Francene Boyers, M.D.    Mr Abd W/wo Cm/mrcp  08-Sep-2012  *RADIOLOGY REPORT*  Clinical Data:  Severe abdominal pain and nausea.  MRI ABDOMEN WITHOUT AND WITH CONTRAST (MRCP)  Technique:  Multiplanar multisequence MR imaging of the abdomen was performed without and with contrast, including heavily T2-weighted images of the biliary and pancreatic ducts.  Three-dimensional MR images were rendered by post processing of the original MR data.  Contrast: 20mL MULTIHANCE GADOBENATE DIMEGLUMINE 529 MG/ML IV SOLN  Comparison:  Ultrasound 08/09/2012.  Findings:  The liver demonstrates diffuse fatty infiltration but no focal hepatic lesion or intrahepatic ductal dilatation.  No definite findings for cirrhosis.  No focal hepatic lesions or intrahepatic biliary dilatation.  The common bile duct is normal in caliber and has a normal course.  The pancreatic duct is normal.  There is diffuse wall thickening and pericholecystic fluid involving the gallbladder as noted on the previous ultrasound.  No obvious explanation.  Recommend correlation with albumin level. Small pleural effusions are noted.  There is a small amount of free abdominal fluid also.  Significant splenomegaly is demonstrated.  The spleen  measures 17.5 x 16.0 x 11.0 cm.  The pancreas is normal.  The adrenal glands and kidneys are normal.  There are small scattered mesenteric and retroperitoneal lymph nodes but no mass or adenopathy.  A small amount of body wall edema is noted.  IMPRESSION:  1.  Pericholecystic fluid, small amount of abdominal ascites and mild body wall edema would suggest a systemic cause.  Recommend correlation with liver function studies,  albumin level and total protein. 2.  No gallstones or common bile duct stones.  The common bile duct and pancreatic ducts are normal. 3.  Marked splenomegaly.   Original Report Authenticated By: Rudie Meyer, M.D.      Additional labs:   Scheduled Meds: . sodium chloride   Intravenous Once  . docusate sodium  100 mg Oral BID  . folic acid  1 mg Oral Daily  . metoprolol tartrate  25 mg Oral BID  . pantoprazole  40 mg Oral BID  . sodium chloride  3 mL Intravenous Q12H  . sucralfate  1 g Oral TID WC & HS  . thiamine  100 mg Oral Daily   Continuous Infusions:    Active Problems:   Elevated LFTs   Fatty liver   GERD (gastroesophageal reflux disease)   Alcohol abuse   Alcoholic hepatitis   Dehydration   Tachycardia   Orthostatic hypotension   Hypokalemia    Time spent: 45 minutes.    Renae Fickle  [facility] Pager [phone].   If 8PM-8AM, please contact night-coverage at [email or web address], password [facility] 08/11/2012, 6:24 PM  LOS: 5 days

## 2012-08-12 LAB — COMPREHENSIVE METABOLIC PANEL
ALT: 84 U/L — ABNORMAL HIGH (ref 0–35)
AST: 137 U/L — ABNORMAL HIGH (ref 0–37)
CO2: 26 mEq/L (ref 19–32)
Calcium: 8.1 mg/dL — ABNORMAL LOW (ref 8.4–10.5)
GFR calc non Af Amer: 80 mL/min — ABNORMAL LOW (ref >90)
Sodium: 135 mEq/L (ref 135–145)
Total Protein: 5.2 g/dL — ABNORMAL LOW (ref 6.0–8.3)

## 2012-08-12 MED ORDER — DIPHENHYDRAMINE HCL 25 MG PO CAPS
25.0000 mg | ORAL_CAPSULE | Freq: Four times a day (QID) | ORAL | Status: DC | PRN
  Administered 2012-08-12: 25 mg via ORAL
  Filled 2012-08-12: qty 1

## 2012-08-12 MED ORDER — SODIUM CHLORIDE 0.9 % IV SOLN
INTRAVENOUS | Status: DC
  Administered 2012-08-12 (×2): via INTRAVENOUS

## 2012-08-12 MED ORDER — MIDODRINE HCL 2.5 MG PO TABS
2.5000 mg | ORAL_TABLET | Freq: Three times a day (TID) | ORAL | Status: DC
  Administered 2012-08-12 – 2012-08-13 (×4): 2.5 mg via ORAL
  Filled 2012-08-12 (×6): qty 1

## 2012-08-12 MED ORDER — METOPROLOL TARTRATE 25 MG PO TABS
25.0000 mg | ORAL_TABLET | Freq: Two times a day (BID) | ORAL | Status: DC
  Administered 2012-08-12 – 2012-08-13 (×2): 25 mg via ORAL
  Filled 2012-08-12 (×3): qty 1

## 2012-08-12 MED ORDER — ONDANSETRON HCL 4 MG/2ML IJ SOLN
4.0000 mg | Freq: Four times a day (QID) | INTRAMUSCULAR | Status: DC
  Administered 2012-08-12 – 2012-08-13 (×5): 4 mg via INTRAVENOUS
  Filled 2012-08-12 (×3): qty 2

## 2012-08-12 NOTE — Evaluation (Signed)
Physical Therapy Evaluation Patient Details Name: Kara Stewart MRN: [id number] DOB: 1975-08-05 Today's Date: 08/12/2012 Time: 1009-1050 PT Time Calculation (min): 41 min  PT Assessment / Plan / Recommendation Clinical Impression  37 y/o WF admitted with dehydration with decreased functional mobility.  Pt with poor activity tolerance for OOB activity and tolerating sitting at EOB in unsupported sitting for  <10 mins before needing to lay supine.  Pt did ambulate to bathroom with PT with slow cadence, wide BOS, and reaching for furniture.  Pt and her mother were educated on importance of upright activity for habituation of BP/dizziness.   Pt declined to sit up in recliner, but pt encouraged to sit EOB throughout the day and when in bed to elevate the HOB.  Orthostatic BPs: supine BP 98/58, sitting 113/71, standing 118/78 with HR 129 with c/o dizziness/weakness with upright activity.    Will follow acutely to work on increasing activity tolerance with further ambulation with skilled monitoring of vital signs.    PT Assessment  Patient needs continued PT services    Follow Up Recommendations  No PT follow up    Does the patient have the potential to tolerate intense rehabilitation      Barriers to Discharge        Equipment Recommendations  None recommended by PT    Recommendations for Other Services     Frequency Min 3X/week    Precautions / Restrictions Restrictions Weight Bearing Restrictions: No   Pertinent Vitals/Pain C/o sternal pain/discomfort with coughing      Mobility  Bed Mobility Bed Mobility: Supine to Sit Supine to Sit: 7: Independent Transfers Transfers: Sit to Stand;Stand to Sit Sit to Stand: 6: Modified independent (Device/Increase time) Stand to Sit: 6: Modified independent (Device/Increase time) Details for Transfer Assistance: slow with transfers and reaching out to hold onto rail Ambulation/Gait Ambulation/Gait Assistance: 4: Min guard Ambulation  Distance (Feet): 15 Feet (x 2) Assistive device: None;Other (Comment) Ambulation/Gait Assistance Details: cruising items of furniture at times Gait Pattern: Wide base of support Gait velocity: slow especially for her age    Exercises     PT Diagnosis: Difficulty walking  PT Problem List: Decreased activity tolerance;Decreased mobility PT Treatment Interventions: Gait training;Stair training;Functional mobility training;Therapeutic activities;Therapeutic exercise;Balance training   PT Goals Acute Rehab PT Goals PT Goal Formulation: With patient Time For Goal Achievement: 08/19/12 Potential to Achieve Goals: Good Pt will Transfer Bed to Chair/Chair to Bed: Independently PT Transfer Goal: Bed to Chair/Chair to Bed - Progress: Goal set today Pt will Ambulate: >150 feet;with supervision PT Goal: Ambulate - Progress: Goal set today Pt will Go Up / Down Stairs: Flight;with supervision PT Goal: Up/Down Stairs - Progress: Goal set today  Visit Information  Last PT Received On: 08/12/12 Assistance Needed: +1    Subjective Data  Subjective: I get lightheaded and weak real easy. Patient Stated Goal: feel better   Prior Functioning  Home Living Lives With: Family Type of Home: House Home Access: Stairs to enter Secretary/administrator of Steps: 3 Entrance Stairs-Rails: None Home Layout: Two level;Bed/bath upstairs Alternate Level Stairs-Number of Steps: flight Alternate Level Stairs-Rails: Left Bathroom Shower/Tub: Tub/shower unit Prior Function Level of Independence: Independent Able to Take Stairs?: Yes Driving: Yes Vocation: Full time employment Communication Communication: No difficulties    Cognition  Cognition Arousal/Alertness: Awake/alert Behavior During Therapy: WFL for tasks assessed/performed Overall Cognitive Status: Within Functional Limits for tasks assessed    Extremity/Trunk Assessment Right Lower Extremity Assessment RLE ROM/Strength/Tone: [facility] for tasks  assessed Left Lower Extremity Assessment LLE ROM/Strength/Tone: [facility] for tasks assessed Trunk Assessment Trunk Assessment: Normal   Balance Balance Balance Assessed: Yes Static Sitting Balance Static Sitting - Balance Support: No upper extremity supported;Feet supported Static Sitting - Level of Assistance: 7: Independent Static Sitting - Comment/# of Minutes: 8 mins and 5 mins befoer having to return supine due to weakness/dizziness Static Standing Balance Static Standing - Balance Support: No upper extremity supported Static Standing - Level of Assistance: 6: Modified independent (Device/Increase time) Static Standing - Comment/# of Minutes: 3  End of Session PT - End of Session Equipment Utilized During Treatment: Gait belt Activity Tolerance: Treatment limited secondary to medical complications (Comment);Other (comment) (c/o feeling "weak" and pain at sternum with cough) Patient left: with family/visitor present;in bed;Other (comment) (sitting EOB) Nurse Communication: Mobility status;Other (comment) (BP)  GP     Keiden Deskin LUBECK 08/12/2012, 11:12 AM

## 2012-08-12 NOTE — Progress Notes (Signed)
Kara Stewart 1:10 PM  Subjective: Patient without any new complaints and was able to eat a little soup  Objective: Vital signs stable afebrile no acute distress abdomen is soft minimal midepigastric discomfort labs stable Assessment: Alcoholic hepatitis mild by lab work criteria  Plan: Please call my partner this weekend if we could be of any further assistance and again have suggested alcohol rehabilitation and care with over-the-counter medicines as an outpatient  [facility]

## 2012-08-12 NOTE — Progress Notes (Addendum)
[facility] PROGRESS NOTE  Kara Stewart ZOX:[id number] DOB: 1975-12-25 DOA: 08/06/2012 PCP: No PCP Per Patient   Assessment/Plan:  Dehydration: Present on admission, resolved after IV fluids. Orthostatic hypotension, symptomatic: BP dropped from 115/65 > 98/45 on 5/5 AM. Unclear etiology. Clinically euvolemic. 2-D echo with grade 1 diastolic dysfunction.  -  Persistent symptomatic orthostasis -  TSH 2.525,  Cortisol 11.1. -  Place TED hose -  Advance diet to increase salt in diet -  Will try low dose midodrine  Alcoholic hepatitis with persistent abdominal discomfort, particularly in epigastric area -  Acute hepatitis panel:  Negative -  Repeat lipase -  RUQ ultrasound:? Acute cholecystitis, but HIDA negative -  MRCP demonstrated generalized gut edema without evidence of gallstones. -   Continue PPI  -   Advance diet  Possible mononucleosis:  May be contributing to fevers, malaise, poor appetite and splenomegaly -  DNA titer to confirm  Splenomegaly, likely due to alcoholic hepatitis and liver disease and possibly active EBV infection.    Tachycardia with history of SVT:  -  Continue metoprolol indefinitely per cardiology, appreciate assistance -  TSH wnl  Low-grade fevers:  CTa negative for pneumonia and PE.  None recently.   -   Blood cx NGTD -  Urine culture Neg -  C. difficile PCR negative  GERD: Continue PPI Alcohol dependence:  Counseled cessation Hypothyroidism: TSH 2.525. Hypertension: Controlled Leukopenia, thrombocytopenia and macrocytosis: Likely secondary to alcohol abuse and splenomegaly. Stable.   Right middle lobe 3 mm lung nodule on CT chest:  Denies smoking.  No follow up necessary   Code Status: Full Family Communication:  discussed with patient. Disposition Plan:  Possible home tomorrow if tolerating some food and less orthostatic.  PT consult pending.     Consultants:  General  surgery  Cardiology  GI  Procedures:  None  Antibiotics:  None   HPI/Subjective: Continues to feel dizzy and cough.  Mildly hypotensive this morning.  Nausea and full feeling persist with poor appetite.  ATe some soup last night, but no breakfast so far.  Having sweats.       Objective: Filed Vitals:   08/11/12 2207 08/11/12 2209 08/11/12 2211 08/12/12 0547  BP: 102/58 102/58 106/70 92/49  Pulse: 115 124 129 102  Temp: 99.7 F (37.6 C) 98.9 F (37.2 C) 99.3 F (37.4 C) 99.6 F (37.6 C)  TempSrc: Oral Oral Oral Oral  Resp: 24 28 16 28   Height:      Weight:      SpO2: 95% 97% 100% 93%    Intake/Output Summary (Last 24 hours) at 08/12/12 1005 Last data filed at 08/12/12 0804  Gross per 24 hour  Intake    600 ml  Output      0 ml  Net    600 ml   Filed Weights   08/07/12 0439  Weight: 89.404 kg (197 lb 1.6 oz)    Exam:   General exam: CF, NAD  HEENT:  MMM NCAT  Respiratory system: Clear. No increased work of breathing.  Cardiovascular system:  Tachycardic, regular rhythm, nl S1 & S2 heard, no mrg.  Telemetry: Sinus tachycardia in the 110s  Gastrointestinal system:  NABS, soft, mildly distended and gassy, TTP in the epigastric area without rebound or guarding.    Central nervous system: Alert and oriented. No focal neurological deficits.    Extremities: Symmetric 5 x 5 power. Not tremulous.    Data Reviewed: Basic Metabolic Panel:  Recent Labs Lab  08/06/12 1845 08/07/12 0625 08/08/12 0420 08/09/12 0415 08/10/12 0457 08/11/12 0530 08/12/12 0435  NA 131* 134* 136 135 133* 134* 135  K 3.3* 3.5 3.2* 3.7 4.4 3.9 4.0  CL 93* 106 107 104 100 102 99  CO2 23 19 22 23 25 25 26   GLUCOSE 76 110* 100* 91 84 89 104*  BUN 6 6 3* <3* 3* 4* 5*  CREATININE 0.83 0.74 0.80 0.72 0.79 0.86 0.91  CALCIUM 8.6 6.9* 7.2* 8.1* 8.1* 8.4 8.1*  MG  --  1.5 1.6  --  2.1  --   --   PHOS  --  1.7*  --   --  3.2  --   --    Liver Function Tests:  Recent Labs Lab  08/08/12 0420 08/09/12 0415 08/10/12 0457 08/11/12 0530 08/12/12 0435  AST 256* 239* 152* 130* 137*  ALT 105* 127* 106* 94* 84*  ALKPHOS 110 121* 103 116 110  BILITOT 1.8* 1.8* 2.1* 2.3* 2.1*  PROT 5.0* 5.4* 5.3* 5.3* 5.2*  ALBUMIN 2.0* 2.0* 2.0* 1.9* 1.9*    Recent Labs Lab 08/09/12 0415 08/10/12 0457  LIPASE 26 19   No results found for this basename: AMMONIA,  in the last 168 hours CBC:  Recent Labs Lab 08/06/12 1845 08/07/12 0706 08/08/12 0420 08/09/12 0415 08/10/12 0457  WBC 4.8 3.6* 3.1* 3.6* 4.7  NEUTROABS  --   --   --   --  1.2*  HGB 15.2* 12.7 11.4* 12.2 12.2  HCT 42.9 36.5 34.0* 36.5 36.4  MCV 101.9* 102.5* 103.7* 104.3* 104.3*  PLT 167 105* 87* 109* 121*   Cardiac Enzymes: No results found for this basename: CKTOTAL, CKMB, CKMBINDEX, TROPONINI,  in the last 168 hours BNP (last 3 results) No results found for this basename: PROBNP,  in the last 8760 hours CBG: No results found for this basename: GLUCAP,  in the last 168 hours  Recent Results (from the past 240 hour(s))  CULTURE, BLOOD (ROUTINE X 2)     Status: None   Collection Time    08/08/12 10:30 AM      Result Value Range Status   Specimen Description BLOOD LEFT HAND   Final   Special Requests BOTTLES DRAWN AEROBIC AND ANAEROBIC 3CC   Final   Culture  Setup Time 08/08/2012 13:53   Final   Culture     Final   Value:        BLOOD CULTURE RECEIVED NO GROWTH TO DATE CULTURE WILL BE HELD FOR 5 DAYS BEFORE ISSUING A FINAL NEGATIVE REPORT   Report Status PENDING   Incomplete  CULTURE, BLOOD (ROUTINE X 2)     Status: None   Collection Time    08/08/12 10:45 AM      Result Value Range Status   Specimen Description BLOOD RIGHT ARM   Final   Special Requests BOTTLES DRAWN AEROBIC AND ANAEROBIC 6CC   Final   Culture  Setup Time 08/08/2012 13:52   Final   Culture     Final   Value:        BLOOD CULTURE RECEIVED NO GROWTH TO DATE CULTURE WILL BE HELD FOR 5 DAYS BEFORE ISSUING A FINAL NEGATIVE REPORT    Report Status PENDING   Incomplete  CLOSTRIDIUM DIFFICILE BY PCR     Status: None   Collection Time    08/08/12  7:11 PM      Result Value Range Status   C difficile by pcr NEGATIVE  NEGATIVE  Final  URINE CULTURE     Status: None   Collection Time    08/09/12  8:35 AM      Result Value Range Status   Specimen Description URINE, CLEAN CATCH   Final   Special Requests NONE   Final   Culture  Setup Time 08/09/2012 12:38   Final   Colony Count 25,000 COLONIES/ML   Final   Culture     Final   Value: Multiple bacterial morphotypes present, none predominant. Suggest appropriate recollection if clinically indicated.   Report Status 2012-08-21 FINAL   Final     Studies: Mr 3d Recon At Scanner  08-21-2012  *RADIOLOGY REPORT*  Clinical Data:  Severe abdominal pain and nausea.  MRI ABDOMEN WITHOUT AND WITH CONTRAST (MRCP)  Technique:  Multiplanar multisequence MR imaging of the abdomen was performed without and with contrast, including heavily T2-weighted images of the biliary and pancreatic ducts.  Three-dimensional MR images were rendered by post processing of the original MR data.  Contrast: 20mL MULTIHANCE GADOBENATE DIMEGLUMINE 529 MG/ML IV SOLN  Comparison:  Ultrasound 08/09/2012.  Findings:  The liver demonstrates diffuse fatty infiltration but no focal hepatic lesion or intrahepatic ductal dilatation.  No definite findings for cirrhosis.  No focal hepatic lesions or intrahepatic biliary dilatation.  The common bile duct is normal in caliber and has a normal course.  The pancreatic duct is normal.  There is diffuse wall thickening and pericholecystic fluid involving the gallbladder as noted on the previous ultrasound.  No obvious explanation.  Recommend correlation with albumin level. Small pleural effusions are noted.  There is a small amount of free abdominal fluid also.  Significant splenomegaly is demonstrated.  The spleen measures 17.5 x 16.0 x 11.0 cm.  The pancreas is normal.  The adrenal  glands and kidneys are normal.  There are small scattered mesenteric and retroperitoneal lymph nodes but no mass or adenopathy.  A small amount of body wall edema is noted.  IMPRESSION:  1.  Pericholecystic fluid, small amount of abdominal ascites and mild body wall edema would suggest a systemic cause.  Recommend correlation with liver function studies,  albumin level and total protein. 2.  No gallstones or common bile duct stones.  The common bile duct and pancreatic ducts are normal. 3.  Marked splenomegaly.   Original Report Authenticated By: Rudie Meyer, M.D.    Mr Abd W/wo Cm/mrcp  Aug 21, 2012  *RADIOLOGY REPORT*  Clinical Data:  Severe abdominal pain and nausea.  MRI ABDOMEN WITHOUT AND WITH CONTRAST (MRCP)  Technique:  Multiplanar multisequence MR imaging of the abdomen was performed without and with contrast, including heavily T2-weighted images of the biliary and pancreatic ducts.  Three-dimensional MR images were rendered by post processing of the original MR data.  Contrast: 20mL MULTIHANCE GADOBENATE DIMEGLUMINE 529 MG/ML IV SOLN  Comparison:  Ultrasound 08/09/2012.  Findings:  The liver demonstrates diffuse fatty infiltration but no focal hepatic lesion or intrahepatic ductal dilatation.  No definite findings for cirrhosis.  No focal hepatic lesions or intrahepatic biliary dilatation.  The common bile duct is normal in caliber and has a normal course.  The pancreatic duct is normal.  There is diffuse wall thickening and pericholecystic fluid involving the gallbladder as noted on the previous ultrasound.  No obvious explanation.  Recommend correlation with albumin level. Small pleural effusions are noted.  There is a small amount of free abdominal fluid also.  Significant splenomegaly is demonstrated.  The spleen measures 17.5 x 16.0 x 11.0  cm.  The pancreas is normal.  The adrenal glands and kidneys are normal.  There are small scattered mesenteric and retroperitoneal lymph nodes but no mass or  adenopathy.  A small amount of body wall edema is noted.  IMPRESSION:  1.  Pericholecystic fluid, small amount of abdominal ascites and mild body wall edema would suggest a systemic cause.  Recommend correlation with liver function studies,  albumin level and total protein. 2.  No gallstones or common bile duct stones.  The common bile duct and pancreatic ducts are normal. 3.  Marked splenomegaly.   Original Report Authenticated By: Rudie Meyer, M.D.      Additional labs:   Scheduled Meds: . sodium chloride   Intravenous Once  . docusate sodium  100 mg Oral BID  . folic acid  1 mg Oral Daily  . metoprolol tartrate  25 mg Oral TID  . pantoprazole  40 mg Oral BID  . sodium chloride  3 mL Intravenous Q12H  . sucralfate  1 g Oral TID WC & HS  . thiamine  100 mg Oral Daily   Continuous Infusions:    Active Problems:   Elevated LFTs   Fatty liver   GERD (gastroesophageal reflux disease)   Alcohol abuse   Alcoholic hepatitis   Dehydration   Tachycardia   Orthostatic hypotension   Hypokalemia   EBV infection    Time spent: 45 minutes.    Renae Fickle  [facility] Pager [phone].   If 8PM-8AM, please contact night-coverage at [email or web address], password [facility] 08/12/2012, 10:05 AM  LOS: 6 days

## 2012-08-12 NOTE — Progress Notes (Signed)
Subjective:  Weak- "feel like crap"  Objective:  Vital Signs in the last 24 hours: Temp:  [98 F (36.7 C)-99.7 F (37.6 C)] 99.6 F (37.6 C) (05/09 0547) Pulse Rate:  [102-129] 102 (05/09 0547) Resp:  [16-28] 28 (05/09 0547) BP: (92-106)/(49-70) 92/49 mmHg (05/09 0547) SpO2:  [93 %-100 %] 93 % (05/09 0547)  Intake/Output from previous day:  Intake/Output Summary (Last 24 hours) at 08/12/12 1041 Last data filed at 08/12/12 0804  Gross per 24 hour  Intake    600 ml  Output      0 ml  Net    600 ml    Physical Exam: General appearance: alert, cooperative and moderately obese Lungs: clear to auscultation bilaterally Heart: regular rate and rhythm   Rate: 115  Rhythm: sinus tachycardia  Lab Results:  Recent Labs  08/10/12 0457  WBC 4.7  HGB 12.2  PLT 121*    Recent Labs  08/11/12 0530 08/12/12 0435  NA 134* 135  K 3.9 4.0  CL 102 99  CO2 25 26  GLUCOSE 89 104*  BUN 4* 5*  CREATININE 0.86 0.91   No results found for this basename: TROPONINI, CK, MB,  in the last 72 hours Hepatic Function Panel  Recent Labs  08/12/12 0435  PROT 5.2*  ALBUMIN 1.9*  AST 137*  ALT 84*  ALKPHOS 110  BILITOT 2.1*   No results found for this basename: CHOL,  in the last 72 hours  Recent Labs  08/10/12 1740  INR 1.13    Imaging: Imaging results have been reviewed- HIDA negative  Cardiac Studies: 2D- - Left ventricle: The cavity size was normal. Wall thickness was normal. Systolic function was vigorous. The estimated ejection fraction was in the range of 65% to 70%. Images were inadequate for LV wall motion assessment. Doppler parameters are consistent with abnormal left ventricular relaxation (grade 1 diastolic dysfunction). The E/e' ratio is <10, suggesting normal LV filling    Assessment/Plan:   Principal Problem:   Alcoholic hepatitis Active Problems:   Alcohol abuse   Elevated LFTs- negative HIDA   Dehydration   Tachycardia- Sinus tach, (TSH WNL)  EBV infection   Fatty liver   GERD (gastroesophageal reflux disease)   Orthostatic hypotension- B/P went up with standing this am.    PLAN:  No further cardiac work up. Hopefully she will improve as alcoholic hepatitis resolves. I suspect HR will correct itself as she improves, Beta blocker could be cut back to BID if needed at discharge. We will arrange follow up in [place]. Please call over the weekend if we can be of assistance.  Corine Shelter PA-C Beeper [phone] 08/12/2012, 10:41 AM  I have seen and evaluated the patient this PM along with Corine Shelter, PA. I agree with his findings, examination as well as impression recommendations.  37 y/o woman with h/o SVT admitted with what sounds like alcoholic hepatitis -- we are consulted for tachycardia. She certainly has had tachycardia in to 130s-140s but has remained in Sinus tachycardia which is most likely compensatory or in response to her ongoing medical problems -- hepatitis, fever, dehydration, pain.  The best treatment for this tachycardia is hydration & treating the underlying condition.  Would not overly treat her HR as catecholamine driven tachycardia is a symptom of the condition and not an underlying cause.    I agree that her HR should improve as her condition improves.  With her h/o of SVT, a standing dose of BB is not unreasonable,  especially if EtOH withdrawal is a concern, but would suspect that the dose can be cut back to BID on discharge.  We will be available if her condition was to worsen or if SVT occurs, but will sign off for now.   Please call with ?s & as noted -- will arrange f/u on d/c.   Marykay Lex, M.D., M.S. [facility] [address] [place], [place]  [address]  [phone] Pager # [phone] 08/12/2012 4:12 PM

## 2012-08-13 LAB — COMPREHENSIVE METABOLIC PANEL
Alkaline Phosphatase: 116 U/L (ref 39–117)
BUN: 5 mg/dL — ABNORMAL LOW (ref 6–23)
Chloride: 104 mEq/L (ref 96–112)
GFR calc Af Amer: >90 mL/min (ref >90)
GFR calc non Af Amer: >90 mL/min (ref >90)
Glucose, Bld: 94 mg/dL (ref 70–99)
Potassium: 4.3 mEq/L (ref 3.5–5.1)
Total Bilirubin: 2.3 mg/dL — ABNORMAL HIGH (ref 0.3–1.2)

## 2012-08-13 MED ORDER — SUCRALFATE 1 G PO TABS: 1.0000 g | ORAL_TABLET | Freq: Four times a day (QID) | ORAL | Status: DC

## 2012-08-13 MED ORDER — ONDANSETRON 4 MG PO TBDP: 4.0000 mg | ORAL_TABLET | Freq: Three times a day (TID) | ORAL | Status: DC | PRN

## 2012-08-13 MED ORDER — PROMETHAZINE HCL 25 MG RE SUPP: 25.0000 mg | Freq: Four times a day (QID) | RECTAL | Status: DC | PRN

## 2012-08-13 MED ORDER — DSS 100 MG PO CAPS: 100.0000 mg | ORAL_CAPSULE | Freq: Two times a day (BID) | ORAL | Status: DC

## 2012-08-13 MED ORDER — MIDODRINE HCL 2.5 MG PO TABS: 2.5000 mg | ORAL_TABLET | Freq: Three times a day (TID) | ORAL | Status: DC

## 2012-08-13 MED ORDER — METOPROLOL TARTRATE 25 MG PO TABS: 25.0000 mg | ORAL_TABLET | Freq: Two times a day (BID) | ORAL | Status: DC

## 2012-08-13 MED ORDER — OXYCODONE HCL 5 MG PO TABS: 5.0000 mg | ORAL_TABLET | Freq: Four times a day (QID) | ORAL | Status: DC | PRN

## 2012-08-13 MED ORDER — LORAZEPAM 2 MG PO TABS: 2.0000 mg | ORAL_TABLET | Freq: Three times a day (TID) | ORAL | Status: DC | PRN

## 2012-08-13 NOTE — Discharge Summary (Signed)
Physician Discharge Summary  Kara Stewart ZOX:[id number] DOB: 05/29/1975 DOA: 08/06/2012  PCP: No PCP Per Patient  Admit date: 08/06/2012 Discharge date: 08/13/2012  Recommendations for Outpatient Follow-up:  1. Please follow up with cardiology within 1 month of discharge to review resting tachycardia and history of SVT. 2. Primary care doctor to reexamine, check heart rate, blood pressure, and follow up pending EBV DNA test.  Please repeat CMP and CBC to follow up transaminitis and pancytopenia.    Discharge Diagnoses:  Principal Problem:   Alcoholic hepatitis Active Problems:   Elevated LFTs- negative HIDA   Fatty liver   GERD (gastroesophageal reflux disease)   Alcohol abuse   Dehydration   Tachycardia   Orthostatic hypotension   EBV infection   Discharge Condition: stable, improved  Diet recommendation: dysphagia 3 diet  Wt Readings from Last 3 Encounters:  08/07/12 89.404 kg (197 lb 1.6 oz)  08/04/12 86.183 kg (190 lb)  07/21/12 88.724 kg (195 lb 9.6 oz)    History of present illness:   Kara Stewart is a 37 y.o. female  has a past medical history of SVT (supraventricular tachycardia); Hiatal hernia; Anxiety; Acid reflux; Dyspepsia (2004); Hypertriglyceridemia; Hypothyroid; Iritis; Hypertension; Asthma; and Panic attacks.  Presented with  She has hx of EtoH abuse but 1 week ago she stopped and went through withdrawal using librium since then she have had 1 beer 3 days ago. She reports cough and low grade fevers. She reports feeling lightheaded and very tired.  She have had some joint pain as well.  She has been seen at [facility] few days ago and was diagnosed with sinusitis she was started on allegra and phenergan/codein but still has not had much improvement.  She presented to [facility] ED and was given 3L of IVF. Her HR initially was in 130's but now down to 110's. She still feels lightheaded. When she ambulates she feels week. She endorses some palpitations. States she have not had  as much urine output as usual but have started to urinate now still very concentrated.  Hospitalist was called for an admission given persistent orthostatis.    Hospital Course:   Dehydration: Present on admission, resolved after IV fluids.   Orthostatic hypotension, symptomatic: BP dropped from 115/65 > 98/45 on 5/5 AM.  Although her orthostatic vital signs resolved with IV fluids, she continued to have dizziness and nausea with exertion.  TSH 2.525, Cortisol 11.1.  2-D echo with grade 1 diastolic dysfunction.  Placed TED hose and encouraged to eat a diet higher in salt.  She continued to have orthostatic like symptoms, however, she responded to low dose midodrine.  Clinically euvolemic at time of discharge and able to ambulate to the bathroom and back without loss of balance or presyncope.  She was also evaluated by physical therapy and was deemed safe for discharge to home without PT follow up or any equipment needs.    Alcoholic hepatitis vs. EBV hepatitis with persistent abdominal discomfort, particularly in epigastric area.  Acute hepatitis panel was negative.  Lipase was repeatedly negative.  RUQ ultrasound demonstrated possible acute cholecystitis, but HIDA negative.  MRCP demonstrated generalized gut edema without evidence of gallstones.  She was evaluated by GI who recommended alcohol cessation and outpatient rehabilitation.  She should continue PPI in case some of her symptoms are due to alcoholic gastritis and avoid NSAIDS.  Her liver function tests trended down with hydration.    Possible mononucleosis: May be contributing to fevers, malaise, poor appetite and splenomegaly.  IgG and IgM to EBV were both positive, however, unusual to have recurrent EBV infection.  May have had a reactivation due to acute illness that contributed to her splenomegaly and hepatitis.  Sent an EBV DNA titer to confirm viremia.    Splenomegaly, likely due to alcoholic hepatitis and liver disease and possibly  active EBV infection.  See above.    Tachycardia with history of SVT.  She did not have SVT during admission but did have sustained sinus tachycardia.  She was seen by cardiology.  ECHO demonstrated grade 1 diastolic dysfunction.  TSH was wnl.  She was started on metoprolol, dose titrated to 25mg  TID per cardiology, but due to relative hypoTN, her dose was reduced to 25mg  BID and her HR remained in the 80s at rest and increased to the low 100s with exertion.  She should continue bid beta blocker indefinitely due to her history of SVT.    Low-grade fevers: CTa negative for pneumonia and PE.  Blood cx NGTD.  Urine culture Neg.  C. difficile PCR negative.  May have been related to acute alcoholic hepatitis vs. EBV.  GERD: Continued PPI  Alcohol dependence: Counseled cessation  Hypothyroidism: TSH 2.525.  Hypertension: Controlled.   Leukopenia, thrombocytopenia and macrocytosis: Likely secondary to alcohol abuse and splenomegaly.  Resolving at the time of discharge.   Right middle lobe 3 mm lung nodule on CT chest: Denies smoking. No follow up necessary   Consultants:  General surgery  Cardiology  GI Procedures:  None Antibiotics:  None    Discharge Exam: Filed Vitals:   08/13/12 0633  BP: 119/63  Pulse: 114  Temp:   Resp:    Filed Vitals:   08/12/12 2130 08/13/12 0630 08/13/12 0632 08/13/12 0633  BP: 112/67 110/66 122/73 119/63  Pulse: 106 98 109 114  Temp: 98.7 F (37.1 C) 99.1 F (37.3 C)    TempSrc: Oral Oral    Resp: 18 20    Height:      Weight:      SpO2: 94% 97% 95% 94%    General exam: CF, NAD  HEENT: MMM NCAT  Respiratory system: CTAB. No increased work of breathing.  Cardiovascular system: RRR, nl S1 & S2 heard, no mrg. Telemetry: Sinus rhythm in the 80s with occasional brief increases to the 100s with exertion.   2+ pulses, warm extremities Gastrointestinal system: NABS, soft, mildly distended and gassy, TTP in the epigastric area without rebound or guarding.   Central nervous system: Alert and oriented. No focal neurological deficits.  Extremities: Symmetric 5 x 5 power. Not tremulous.  No LEE  Discharge Instructions      Discharge Orders   Future Orders Complete By Expires     Call MD for:  difficulty breathing, headache or visual disturbances  As directed     Call MD for:  extreme fatigue  As directed     Call MD for:  hives  As directed     Call MD for:  persistant dizziness or light-headedness  As directed     Call MD for:  persistant nausea and vomiting  As directed     Call MD for:  severe uncontrolled pain  As directed     Call MD for:  temperature >100.4  As directed     Diet general  As directed     Discharge instructions  As directed     Comments:      You were hospitalized with alcoholic hepatitis and mononucleosis.  Please  STOP drinking alcohol.  Stay sober.  You are at risk of alcoholic cirrhosis, an irreversible liver problem which can be fatal.  It may take weeks to recover from your illness, but please try to be active as some activity may help you recover more quickly.  Take zofran and phenergan as needed for nausea.  You had a fast heart rate, but not SVT, during your admission.  You were seen by cardiology and have been started on metoprolol 25 mg twice daily.  Your heart rate has trended down nicely and may continue to trend down as you recover from this illness.  You will need to see your primary care doctor within 1 week of discharge to check your blood pressure and your pulse.  If you are able to check your blood pressure at home, please call your doctor if the systolic (top) number is < 90.  Please call your doctor if your heart rate remains greater than 115 beats per minute despite rest.  Your primary care doctor should follow up on the results of the pending mononucleosis test.    Increase activity slowly  As directed         Medication List    STOP taking these medications       ALKA-SELTZER PO      chlordiazePOXIDE 25 MG capsule  Commonly known as:  LIBRIUM     promethazine-codeine 6.25-10 MG/5ML syrup  Commonly known as:  PHENERGAN with CODEINE      TAKE these medications       albuterol 108 (90 BASE) MCG/ACT inhaler  Commonly known as:  PROVENTIL HFA;VENTOLIN HFA  Inhale 2 puffs into the lungs every 6 (six) hours as needed for wheezing or shortness of breath.     dexlansoprazole 60 MG capsule  Commonly known as:  DEXILANT  Take 60 mg by mouth daily.     DSS 100 MG Caps  Take 100 mg by mouth 2 (two) times daily.     fexofenadine-pseudoephedrine 60-120 MG per tablet  Commonly known as:  ALLEGRA-D  Take 1 tablet by mouth every 12 (twelve) hours.     guaiFENesin 100 MG/5ML Soln  Commonly known as:  ROBITUSSIN  Take 5 mLs by mouth every 6 (six) hours as needed (cough).     LORazepam 2 MG tablet  Commonly known as:  ATIVAN  Take 2 mg by mouth 3 (three) times daily as needed for anxiety.     metoprolol tartrate 25 MG tablet  Commonly known as:  LOPRESSOR  Take 1 tablet (25 mg total) by mouth 2 (two) times daily.     midodrine 2.5 MG tablet  Commonly known as:  PROAMATINE  Take 1 tablet (2.5 mg total) by mouth 3 (three) times daily with meals.     ondansetron 4 MG disintegrating tablet  Commonly known as:  ZOFRAN ODT  Take 1 tablet (4 mg total) by mouth every 8 (eight) hours as needed for nausea.     oxyCODONE 5 MG immediate release tablet  Commonly known as:  Oxy IR/ROXICODONE  Take 1 tablet (5 mg total) by mouth every 6 (six) hours as needed.     promethazine 25 MG suppository  Commonly known as:  PHENERGAN  Place 1 suppository (25 mg total) rectally every 6 (six) hours as needed for nausea.     REFRESH OP  Place 2 drops into both eyes as needed (dry eyes).     RESTORA PO  Take 1 tablet by mouth daily.  Follow-up Information   Follow up with No PCP Per Patient. Schedule an appointment as soon as possible for a visit in 1 week.   Contact  information:   [address] [place] [address] [phone] May call [phone] for assistance finding a primary care doctor       Follow up with [facility]. Schedule an appointment as soon as possible for a visit in 1 week.   Contact information:   [address] [place] [address] [phone]       The results of significant diagnostics from this hospitalization (including imaging, microbiology, ancillary and laboratory) are listed below for reference.    Significant Diagnostic Studies: Dg Chest 2 View  08/06/2012  *RADIOLOGY REPORT*  Clinical Data: Shortness of breath, cough  CHEST - 2 VIEW  Comparison: 08/04/2012  Findings: Mild bibasilar opacities and peribronchial thickening. No pleural effusion or pneumothorax.  Cardiomediastinal contours within normal range.  No acute osseous finding.  IMPRESSION: Mild bibasilar opacities and peribronchial markings, nonspecific; atelectasis, early infiltrate, or atypical/viral pneumonia.   Original Report Authenticated By: Jearld Lesch, M.D.    Dg Chest 2 View  08/04/2012  *RADIOLOGY REPORT*  Clinical Data: Fever and cough  CHEST - 2 VIEW  Comparison: July 03, 2012  Findings: The lungs are clear.  The heart size and pulmonary vascularity are normal.  No adenopathy.  No bone lesions.  IMPRESSION: No abnormality noted.   Original Report Authenticated By: Bretta Bang, M.D.    Ct Angio Chest Pe W/cm &/or Wo Cm  08/07/2012  *RADIOLOGY REPORT*  Clinical Data: Elevated D-dimer, cough  CT ANGIOGRAPHY CHEST  Technique:  Multidetector CT imaging of the chest using the standard protocol during bolus administration of intravenous contrast. Multiplanar reconstructed images including MIPs were obtained and reviewed to evaluate the vascular anatomy.  Contrast: OMNIPAQUE IOHEXOL 350 MG/ML SOLN  Comparison: Chest x-ray earlier today; relatively recent prior chest CT July 03, 2012  Findings:   Mediastinum: Unremarkable CT appearance of the thyroid gland.  No suspicious mediastinal or hilar adenopathy.  No soft tissue mediastinal mass.  The thoracic esophagus is unremarkable.  Heart/Vascular: Adequate opacification of the pulmonary arteries to the segmental level.  No central filling defect to suggest acute pulmonary embolus.  Conventional three-vessel arch anatomy.  No aneurysmal dilatation or acute dissection.  Normal-caliber main and central pulmonary arteries.  Heart is within normal limits for size.  No pericardial effusion.  Lungs/Pleura: 3 mm pulmonary nodule in the right middle lobe, unchanged compared to recent prior.  The lungs are otherwise clear.  Upper Abdomen: Marked hypoattenuation of the hepatic parenchyma consistent with hepatic steatosis.  Although incompletely imaged, the spleen appears enlarged.  Otherwise, the visualized upper abdomen is unremarkable.  Bones: No acute fracture or aggressive appearing lytic or blastic osseous lesion.  IMPRESSION:  1.  Negative for pulmonary embolism, pneumonia or other acute cardiopulmonary process.  2.  Stable 3 mm nonspecific pulmonary nodule the right middle lobe compared to 07/03/2012.  If the patient is at high risk for bronchogenic carcinoma, follow-up chest CT at 1 year is recommended.  If the patient is at low risk, no follow-up is needed.  This recommendation follows the consensus statement: Guidelines for Management of Small Pulmonary Nodules Detected on CT Scans:  A Statement from the Fleischner Society as published in Radiology 2005; 237:395-400.  3.  Persistent marked hepatic steatosis  4.  Although incompletely evaluated, the spleen appears enlarged. Query  history of splenomegaly   Original Report Authenticated By: Malachy Moan, M.D.    US Abdomen Complete  08/09/2012  *RADIOLOGY REPORT*  Clinical Data:  Mid abdominal and chest pain.  COMPLETE ABDOMINAL ULTRASOUND  Comparison:  08/07/2012 chest CT.  10/19/2011 ultrasound.  Findings:   Gallbladder:  Diffuse gallbladder wall thickening with mild pericholecystic fluid.  The patient was tender over this region during scanning.  This raises possibility of cholecystitis.  Other causes of gallbladder wall thickening such as metabolic abnormality or congestive heart failure felt to be secondary considerations. Clinical correlation recommended.  Common bile duct:  .  6.7 mm peri  Liver:  Enlarged and echogenic suggestive of diffuse fatty infiltration.  No obvious focal mass  IVC:  Limited by bowel gas.  Pancreas:  Limited by bowel gas.  Spleen:  Enlarged spanning over 15.7 cm with volume of 854.6 ml.  Right Kidney:  10.6 cm. No hydronephrosis or renal mass.  Left Kidney:  10.3 cm. No hydronephrosis or renal mass.  Abdominal aorta:  Limited y bowel gas.  Maximal AP dimension obtained 1.7 cm.,  IMPRESSION: Diffuse gallbladder wall thickening with mild pericholecystic fluid.  The patient was tender over this region during scanning. This raises possibility of cholecystitis.  Enlarged fatty liver.  Splenomegaly.  Limited evaluation of the inferior vena cava, pancreas and aorta secondary to bowel gas.  Please see above.  This has been made a PRA call report utilizing dashboard call feature. .   Original Report Authenticated By: Lacy Duverney, M.D.    Mr 3d Recon At Scanner  08/10/2012  *RADIOLOGY REPORT*  Clinical Data:  Severe abdominal pain and nausea.  MRI ABDOMEN WITHOUT AND WITH CONTRAST (MRCP)  Technique:  Multiplanar multisequence MR imaging of the abdomen was performed without and with contrast, including heavily T2-weighted images of the biliary and pancreatic ducts.  Three-dimensional MR images were rendered by post processing of the original MR data.  Contrast: 20mL MULTIHANCE GADOBENATE DIMEGLUMINE 529 MG/ML IV SOLN  Comparison:  Ultrasound 08/09/2012.  Findings:  The liver demonstrates diffuse fatty infiltration but no focal hepatic lesion or intrahepatic ductal dilatation.  No definite findings  for cirrhosis.  No focal hepatic lesions or intrahepatic biliary dilatation.  The common bile duct is normal in caliber and has a normal course.  The pancreatic duct is normal.  There is diffuse wall thickening and pericholecystic fluid involving the gallbladder as noted on the previous ultrasound.  No obvious explanation.  Recommend correlation with albumin level. Small pleural effusions are noted.  There is a small amount of free abdominal fluid also.  Significant splenomegaly is demonstrated.  The spleen measures 17.5 x 16.0 x 11.0 cm.  The pancreas is normal.  The adrenal glands and kidneys are normal.  There are small scattered mesenteric and retroperitoneal lymph nodes but no mass or adenopathy.  A small amount of body wall edema is noted.  IMPRESSION:  1.  Pericholecystic fluid, small amount of abdominal ascites and mild body wall edema would suggest a systemic cause.  Recommend correlation with liver function studies,  albumin level and total protein. 2.  No gallstones or common bile duct stones.  The common bile duct and pancreatic ducts are normal. 3.  Marked splenomegaly.   Original Report Authenticated By: Rudie Meyer, M.D.    Nm Hepato W/eject Fract  08/10/2012  *RADIOLOGY REPORT*  Clinical Data: Right upper quadrant pain and nausea.  Abnormal gallbladder ultrasound.  NUCLEAR MEDICINE HEPATOBILIARY WITH GB, PHARM AND QUAN MEASURE  Radiopharmaceutical:  5.4 mCi technetium Choletec IV.  8 ounces of Ensure orally.  Comparison: Ultrasound dated 08/09/2012  Findings: The gallbladder is visualized at 20 minutes.  There is activity in the bowel immediately after Ensure ingestion.  Ejection fraction was 47.2% at 59 minutes.  IMPRESSION: Normal hepatobiliary scan.  Normal ejection fraction.   Original Report Authenticated By: Francene Boyers, M.D.    Mr Abd W/wo Cm/mrcp  08/10/2012  *RADIOLOGY REPORT*  Clinical Data:  Severe abdominal pain and nausea.  MRI ABDOMEN WITHOUT AND WITH CONTRAST (MRCP)  Technique:   Multiplanar multisequence MR imaging of the abdomen was performed without and with contrast, including heavily T2-weighted images of the biliary and pancreatic ducts.  Three-dimensional MR images were rendered by post processing of the original MR data.  Contrast: 20mL MULTIHANCE GADOBENATE DIMEGLUMINE 529 MG/ML IV SOLN  Comparison:  Ultrasound 08/09/2012.  Findings:  The liver demonstrates diffuse fatty infiltration but no focal hepatic lesion or intrahepatic ductal dilatation.  No definite findings for cirrhosis.  No focal hepatic lesions or intrahepatic biliary dilatation.  The common bile duct is normal in caliber and has a normal course.  The pancreatic duct is normal.  There is diffuse wall thickening and pericholecystic fluid involving the gallbladder as noted on the previous ultrasound.  No obvious explanation.  Recommend correlation with albumin level. Small pleural effusions are noted.  There is a small amount of free abdominal fluid also.  Significant splenomegaly is demonstrated.  The spleen measures 17.5 x 16.0 x 11.0 cm.  The pancreas is normal.  The adrenal glands and kidneys are normal.  There are small scattered mesenteric and retroperitoneal lymph nodes but no mass or adenopathy.  A small amount of body wall edema is noted.  IMPRESSION:  1.  Pericholecystic fluid, small amount of abdominal ascites and mild body wall edema would suggest a systemic cause.  Recommend correlation with liver function studies,  albumin level and total protein. 2.  No gallstones or common bile duct stones.  The common bile duct and pancreatic ducts are normal. 3.  Marked splenomegaly.   Original Report Authenticated By: Rudie Meyer, M.D.     Microbiology: Recent Results (from the past 240 hour(s))  CULTURE, BLOOD (ROUTINE X 2)     Status: None   Collection Time    08/08/12 10:30 AM      Result Value Range Status   Specimen Description BLOOD LEFT HAND   Final   Special Requests BOTTLES DRAWN AEROBIC AND  ANAEROBIC 3CC   Final   Culture  Setup Time 08/08/2012 13:53   Final   Culture     Final   Value:        BLOOD CULTURE RECEIVED NO GROWTH TO DATE CULTURE WILL BE HELD FOR 5 DAYS BEFORE ISSUING A FINAL NEGATIVE REPORT   Report Status PENDING   Incomplete  CULTURE, BLOOD (ROUTINE X 2)     Status: None   Collection Time    08/08/12 10:45 AM      Result Value Range Status   Specimen Description BLOOD RIGHT ARM   Final   Special Requests BOTTLES DRAWN AEROBIC AND ANAEROBIC [facility]   Final   Culture  Setup Time 08/08/2012 13:52   Final   Culture     Final   Value:        BLOOD CULTURE RECEIVED NO GROWTH TO DATE CULTURE WILL BE HELD FOR 5 DAYS BEFORE ISSUING A FINAL NEGATIVE REPORT   Report Status PENDING  Incomplete  CLOSTRIDIUM DIFFICILE BY PCR     Status: None   Collection Time    08/08/12  7:11 PM      Result Value Range Status   C difficile by pcr NEGATIVE  NEGATIVE Final  URINE CULTURE     Status: None   Collection Time    08/09/12  8:35 AM      Result Value Range Status   Specimen Description URINE, CLEAN CATCH   Final   Special Requests NONE   Final   Culture  Setup Time 08/09/2012 12:38   Final   Colony Count 25,000 COLONIES/ML   Final   Culture     Final   Value: Multiple bacterial morphotypes present, none predominant. Suggest appropriate recollection if clinically indicated.   Report Status 08/10/2012 FINAL   Final     Labs: Basic Metabolic Panel:  Recent Labs Lab 08/06/12 1845 08/07/12 1610 08/08/12 0420 08/09/12 0415 08/10/12 0457 08/11/12 0530 08/12/12 0435 08/13/12 0508  NA 131* 134* 136 135 133* 134* 135 135  K 3.3* 3.5 3.2* 3.7 4.4 3.9 4.0 4.3  CL 93* 106 107 104 100 102 99 104  CO2 23 19 22 23 25 25 26 22   GLUCOSE 76 110* 100* 91 84 89 104* 94  BUN 6 6 3* <3* 3* 4* 5* 5*  CREATININE 0.83 0.74 0.80 0.72 0.79 0.86 0.91 0.80  CALCIUM 8.6 6.9* 7.2* 8.1* 8.1* 8.4 8.1* 8.1*  MG  --  1.5 1.6  --  2.1  --   --   --   PHOS  --  1.7*  --   --  3.2  --   --   --     Liver Function Tests:  Recent Labs Lab 08/09/12 0415 08/10/12 0457 08/11/12 0530 08/12/12 0435 08/13/12 0508  AST 239* 152* 130* 137* 130*  ALT 127* 106* 94* 84* 79*  ALKPHOS 121* 103 116 110 116  BILITOT 1.8* 2.1* 2.3* 2.1* 2.3*  PROT 5.4* 5.3* 5.3* 5.2* 5.4*  ALBUMIN 2.0* 2.0* 1.9* 1.9* 1.9*    Recent Labs Lab 08/09/12 0415 08/10/12 0457 08/12/12 0435  LIPASE 26 19 18    No results found for this basename: AMMONIA,  in the last 168 hours CBC:  Recent Labs Lab 08/06/12 1845 08/07/12 0706 08/08/12 0420 08/09/12 0415 08/10/12 0457  WBC 4.8 3.6* 3.1* 3.6* 4.7  NEUTROABS  --   --   --   --  1.2*  HGB 15.2* 12.7 11.4* 12.2 12.2  HCT 42.9 36.5 34.0* 36.5 36.4  MCV 101.9* 102.5* 103.7* 104.3* 104.3*  PLT 167 105* 87* 109* 121*   Cardiac Enzymes: No results found for this basename: CKTOTAL, CKMB, CKMBINDEX, TROPONINI,  in the last 168 hours BNP: BNP (last 3 results) No results found for this basename: PROBNP,  in the last 8760 hours CBG: No results found for this basename: GLUCAP,  in the last 168 hours  Time coordinating discharge: 45 minutes  Signed:  Deny Chevez  [facility] 08/13/2012, 1:19 PM

## 2012-08-14 LAB — CULTURE, BLOOD (ROUTINE X 2): Culture: NO GROWTH

## 2012-08-16 LAB — EPSTEIN BARR VRS(EBV DNA BY PCR): EBV DNA QN by PCR: <500 copies/mL (ref <500)

## 2012-08-18 ENCOUNTER — Telehealth: Payer: Self-pay | Admitting: Internal Medicine

## 2012-08-18 ENCOUNTER — Other Ambulatory Visit: Payer: Self-pay | Admitting: Gastroenterology

## 2012-08-18 MED ORDER — OXYCODONE HCL 5 MG PO TABS
5.0000 mg | ORAL_TABLET | Freq: Four times a day (QID) | ORAL | Status: DC | PRN
Start: 2012-08-18 — End: 2012-09-07

## 2012-08-18 MED ORDER — OXYCODONE HCL 5 MG PO TABS: 5.0000 mg | ORAL_TABLET | Freq: Four times a day (QID) | ORAL | Status: DC | PRN

## 2012-08-18 NOTE — Telephone Encounter (Signed)
Routing to DS. This is a [name] pt. 

## 2012-08-18 NOTE — Addendum Note (Signed)
Addended by: Tiffany Kocher on: 08/18/2012 03:30 PM   Modules accepted: Orders

## 2012-08-18 NOTE — Telephone Encounter (Signed)
Called and informed mom. Pt had left to come to town so she would be close by if she could get the medication today.  Darl Pikes is putting on cancellation list to get in earlier if possible.

## 2012-08-18 NOTE — Telephone Encounter (Signed)
Called pt. She said she took last oxycodone this AM. Still has a lot of pain, and swelling in spleen area. It hurts so bad sometimes that she can hardly ear. Mostly just walks from the bed to the bathroom. Her appt for follow up is 08/30/2012. Please advise!

## 2012-08-18 NOTE — Telephone Encounter (Addendum)
Recent admission (reviewed records) for etoh hepatitis/splenomegaly. No convincing evidence for acute cholecystitis.  1. Go to ED if pain worsens, feels lightheaded, dizzy, blood in stool, black stools. 2. Keep appointment with [place] as scheduled. Please put patient on list in case we have a cancellation so we can see her sooner. 3. Can give her oxycodone to take for moderate to severe pain only. She will need to pick up RX.

## 2012-08-18 NOTE — Telephone Encounter (Signed)
Tana Coast, PA had left before signing the prescription, so Gerrit Halls, NP,  wrote the Rx for pt to pick up.

## 2012-08-18 NOTE — Progress Notes (Signed)
Due to original prescribing provider not able to sign original prescription for Oxycodone, we have shredded the original. I have reordered under my name and signed.

## 2012-08-18 NOTE — Telephone Encounter (Signed)
Patients mother called and made a hospital f/u for [facility] for May 27th, Leontine has been in [facility] and was discharged with a Rx for oxycodiene and her mother is asking if we can refill this Rx until she can be seen, please advise?

## 2012-08-19 NOTE — Telephone Encounter (Signed)
LATE ENTRY:  Pt came by the office yesterday right at closing and I gave her the prescription and went over the info with her.

## 2012-08-30 ENCOUNTER — Ambulatory Visit (INDEPENDENT_AMBULATORY_CARE_PROVIDER_SITE_OTHER): Payer: Self-pay | Admitting: Gastroenterology

## 2012-08-30 ENCOUNTER — Encounter: Payer: Self-pay | Admitting: Gastroenterology

## 2012-08-30 VITALS — BP 118/77 | HR 84 | Temp 97.4°F | Ht 64.0 in | Wt 187.8 lb

## 2012-08-30 DIAGNOSIS — R1013 Epigastric pain: Secondary | ICD-10-CM

## 2012-08-30 DIAGNOSIS — Z8719 Personal history of other diseases of the digestive system: Secondary | ICD-10-CM

## 2012-08-30 DIAGNOSIS — K3189 Other diseases of stomach and duodenum: Secondary | ICD-10-CM

## 2012-08-30 NOTE — Patient Instructions (Addendum)
Continue taking Dexilant each morning. We have provided more samples.  Please have blood work done. We will call you with the results.  You will return in 6 weeks to see Dr. Darrick Penna.  Congratulations on staying away from alcohol!! PROUD OF YOU!!!

## 2012-08-30 NOTE — Progress Notes (Signed)
Referring Provider: No ref. provider found Primary Care Physician:  No PCP Per Patient Primary GI: Dr. Darrick Penna   Chief Complaint  Patient presents with  . Follow-up    HPI:   Kara Stewart is a 37 year old female who presents today in follow-up with a history of alcoholic hepatitis, GERD, abdominal pain, and last seen in April 2014 at our office. Since that time, she was hospitalized at [facility] early May 2014 with abdominal pain, elevated LFTs, questionable gallstones versus mild alcoholic hepatitis. Last EGD in August 2013 with mild esophagitis, patent esophageal stricture, moderate gastritis, NEGATIVE h.pylori and NEGATIVE celiac. Recently had MRCP showing no gallstones or CBD stones. [place] of abdomen with enlarged fatty liver, splenomegaly. HIDA with EF of 47.2% at 59 minutes, normal scan. She states she had pain with ensure. Question of mono per patient.   Notes LUQ pain intermittently, no association with eating/drinking. Notes epigastric pain with eating. +nausea, associated with feeling quite flushed. "when I feel hot or start hurting real bad". Out of Dexilant, and this seemed to work quite well. Since she has switched to Nexium, she has had fewer loose stools. On 2 occasions, she has seen "tarry" with one wipe. Looked black and silky. A few black specks. No loose stool since out of hospital. No further ETOH, and she states she had stopped prior to her last admission. Almost sober X 1 month. Has a good support system. AT TIMES PAIN IS WORSE WITH SITTING.      Past Medical History  Diagnosis Date  . SVT (supraventricular tachycardia)   . Hiatal hernia   . Anxiety   . Acid reflux   . Dyspepsia 2004    Dx w/ PUD but no EGD, [place], [place]   . Hypertriglyceridemia   . Hypothyroid   . Iritis     frequent  . Hypertension   . Asthma   . Panic attacks     Past Surgical History  Procedure Laterality Date  . Esophagogastroduodenoscopy  11/06/2011    [name]: MILD Esophagitis/PATENT ESOPHAGEAL  Stricture/  Moderate gastritis. Bx no.hpylori or celiac, +gastritis    Current Outpatient Prescriptions  Medication Sig Dispense Refill  . albuterol (PROVENTIL HFA;VENTOLIN HFA) 108 (90 BASE) MCG/ACT inhaler Inhale 2 puffs into the lungs every 6 (six) hours as needed for wheezing or shortness of breath.      . dexlansoprazole (DEXILANT) 60 MG capsule Take 60 mg by mouth daily.      Marland Kitchen docusate sodium 100 MG CAPS Take 100 mg by mouth 2 (two) times daily.  10 capsule    . fexofenadine-pseudoephedrine (ALLEGRA-D) 60-120 MG per tablet Take 1 tablet by mouth every 12 (twelve) hours.  30 tablet  0  . guaiFENesin (ROBITUSSIN) 100 MG/5ML SOLN Take 5 mLs by mouth every 6 (six) hours as needed (cough).      . LORazepam (ATIVAN) 2 MG tablet Take 1 tablet (2 mg total) by mouth 3 (three) times daily as needed for anxiety.  30 tablet  0  . metoprolol tartrate (LOPRESSOR) 25 MG tablet Take 1 tablet (25 mg total) by mouth 2 (two) times daily.  60 tablet  1  . midodrine (PROAMATINE) 2.5 MG tablet Take 1 tablet (2.5 mg total) by mouth 3 (three) times daily with meals.  90 tablet  0  . ondansetron (ZOFRAN ODT) 4 MG disintegrating tablet Take 1 tablet (4 mg total) by mouth every 8 (eight) hours as needed for nausea.  40 tablet  0  . oxyCODONE (OXY  IR/ROXICODONE) 5 MG immediate release tablet Take 1 tablet (5 mg total) by mouth every 6 (six) hours as needed for pain.  30 tablet  0  . Polyvinyl Alcohol-Povidone (REFRESH OP) Place 2 drops into both eyes as needed (dry eyes).      . Probiotic Product (RESTORA PO) Take 1 tablet by mouth daily.      . promethazine (PHENERGAN) 25 MG suppository Place 1 suppository (25 mg total) rectally every 6 (six) hours as needed for nausea.  24 each  0  . sucralfate (CARAFATE) 1 G tablet Take 1 tablet (1 g total) by mouth 4 (four) times daily.  120 tablet  0   No current facility-administered medications for this visit.    Allergies as of 08/30/2012 - Review Complete 08/30/2012   Allergen Reaction Noted  . Penicillins Anaphylaxis 10/18/2011    Family History  Problem Relation Age of Onset  . Stomach cancer Paternal Grandfather   . Breast cancer Maternal Grandmother     History   Social History  . Marital Status: Divorced    Spouse Name: N/A    Number of Children: 0  . 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 Smoking status: Never Smoker   . Smokeless tobacco: None  . Alcohol Use: No     Almost sober X 1 month  . Drug Use: No  . Sexually Active: Yes    Birth Control/ Protection: None     Comment: depo   Other Topics Concern  . None   Social History Narrative   Lives w/ grandfather or parents or family member (moved from [place] 1 month)   Previous MD: Phill Mutter, NP ([place], )          Review of Systems: Negative unless mentioned in HPI  Physical Exam: BP 118/77  Pulse 84  Temp(Src) 97.4 F (36.3 C) (Oral)  Ht 5\' 4"  (1.626 m)  Wt 187 lb 12.8 oz (85.186 kg)  BMI 32.22 kg/m2 General:   Alert and oriented. No distress noted. Pleasant and cooperative.  Head:  Normocephalic and atraumatic. Eyes:  Conjuctiva clear without scleral icterus. Heart:  S1, S2 present without murmurs, rubs, or gallops. Regular rate and rhythm. Abdomen:  +BS, soft, mild TTP epigastric and LUQ and non-distended.  Msk:  Symmetrical without gross deformities. Normal posture. Extremities:  Without edema. Neurologic:  Alert and  oriented x4;  grossly normal neurologically. Skin:  Intact without significant lesions or rashes. Psych:  Alert and cooperative. Normal mood and affect.

## 2012-08-31 ENCOUNTER — Telehealth: Payer: Self-pay | Admitting: Cardiovascular Disease

## 2012-08-31 DIAGNOSIS — Z8719 Personal history of other diseases of the digestive system: Secondary | ICD-10-CM | POA: Insufficient documentation

## 2012-08-31 DIAGNOSIS — R1013 Epigastric pain: Secondary | ICD-10-CM | POA: Insufficient documentation

## 2012-08-31 NOTE — Assessment & Plan Note (Signed)
LUQ and epigastric pain noted. LUQ without association with food; however, she states epigastric pain worsens with eating. Associated nausea. She is out of Dexilant, which has helped her symptoms in the past. Encouraging that an EGD is on file from August 2013 with negative H.pylori, celiac, and moderate gastritis noted. Doubt repeat EGD would shed further light, and it is unclear if she has noted true melena (see HPI). Hgb normal as of May 2014. MRCP, [place], and HIDA on file with +reproduction of symptoms with ensure at time of HIDA. Question biliary dyskinesia. Will check HFP first then consider referral to general surgery. As of note, I told patient we would not supply further pain medications.

## 2012-08-31 NOTE — Assessment & Plan Note (Signed)
37 year old female recently hospitalized for mild alcoholic hepatitis, no steroids required. She has reported cessation of ETOH for almost one month and notes a strong support system. I have applauded her on this and encouraged her to continue avoidance of ETOH. Check HFP, INR in near future. Further recommendations to follow after blood work completed.

## 2012-08-31 NOTE — Progress Notes (Signed)
No PCP on file 

## 2012-09-07 ENCOUNTER — Ambulatory Visit: Payer: Self-pay | Admitting: Cardiovascular Disease

## 2012-09-07 ENCOUNTER — Ambulatory Visit (INDEPENDENT_AMBULATORY_CARE_PROVIDER_SITE_OTHER): Payer: Self-pay | Admitting: Cardiovascular Disease

## 2012-09-07 ENCOUNTER — Encounter: Payer: Self-pay | Admitting: Cardiovascular Disease

## 2012-09-07 VITALS — BP 102/70 | HR 87 | Ht 64.5 in | Wt 184.0 lb

## 2012-09-07 DIAGNOSIS — R079 Chest pain, unspecified: Secondary | ICD-10-CM

## 2012-09-07 DIAGNOSIS — I498 Other specified cardiac arrhythmias: Secondary | ICD-10-CM

## 2012-09-07 DIAGNOSIS — K760 Fatty (change of) liver, not elsewhere classified: Secondary | ICD-10-CM

## 2012-09-07 DIAGNOSIS — I471 Supraventricular tachycardia: Secondary | ICD-10-CM

## 2012-09-07 DIAGNOSIS — I951 Orthostatic hypotension: Secondary | ICD-10-CM

## 2012-09-07 DIAGNOSIS — K7689 Other specified diseases of liver: Secondary | ICD-10-CM

## 2012-09-07 DIAGNOSIS — E669 Obesity, unspecified: Secondary | ICD-10-CM | POA: Insufficient documentation

## 2012-09-07 NOTE — Patient Instructions (Signed)
Your physician recommends that you schedule a follow-up appointment in: 6 months.  No changes has been made in your theraphy today. 

## 2012-09-07 NOTE — Progress Notes (Signed)
Patient ID: Kara Stewart, female   DOB: 02/20/1976, 37 y.o.   MRN: [id number]   HPI: Kara Stewart, is a 37 y.o. female who presents to the office today for cardiology evaluation. I had initially seen her in 2011. At that time, she had experienced an episode of supraventricular tachycardia and had been evaluated at [facility] told her heart rate had risen up to 193 beats per minute and was in supraventricular tachycardia. At that time she did receive 2 doses of adenosine. I had seen her in followup. At that time she did have significant caffeine intake this was  was significantly reduced. An echo Doppler study done at that time was essentially normal. Show normal myocardial perfusion study. She had been on metoprolol which he was taking on a when necessary basis. Over the last several years she apparently was noticing a fairly frequent episodes of short-lived palpitations for which she was on an as-needed basis take the metoprolol. She apparently was recently hospitalized at [facility] with increased heart rate as well as orthostatic hypotension. She underwent extensive evaluation apparently had right upper quadrant discomfort, as well as abdominal pain elevated liver function studies and questionable gallstone versus mild alcoholic hepatitis. She was found to have enlarged fatty liver and significant splenomegaly. Her HIDA scan revealed good gallbladder function with ejection fraction of 47.2 and 59 minutes. She has continued to note some mild right upper quadrant discomfort. While she was hospitalized, she was started back on daily beta blocker therapy in addition was started on low-dose Midrin reduce potential for continued orthostatic hypotension he ultimately stabilized and was discharged home. She will be seen GI physician and followup over the next several weeks. On her daily beta blocker therapy, she denies any breakthrough tachycardia palpitations. She  denies any recent lightheadedness. She still notes some intermittent right upper quadrant tenderness.  Past Medical History  Diagnosis Date  . SVT (supraventricular tachycardia)   . Hiatal hernia   . Anxiety   . Acid reflux   . Dyspepsia 2004    Dx w/ PUD but no EGD, [place], [place]   . Hypertriglyceridemia   . Hypothyroid   . Iritis     frequent  . Hypertension   . Asthma   . Panic attacks     Past Surgical History  Procedure Laterality Date  . Esophagogastroduodenoscopy  11/06/2011    [name]: MILD Esophagitis/PATENT ESOPHAGEAL Stricture/  Moderate gastritis. Bx no.hpylori or celiac, +gastritis    Allergies  Allergen Reactions  . Penicillins Anaphylaxis    Current Outpatient Prescriptions  Medication Sig Dispense Refill  . albuterol (PROVENTIL HFA;VENTOLIN HFA) 108 (90 BASE) MCG/ACT inhaler Inhale 2 puffs into the lungs every 6 (six) hours as needed for wheezing or shortness of breath.      . dexlansoprazole (DEXILANT) 60 MG capsule Take 60 mg by mouth daily.      . fexofenadine-pseudoephedrine (ALLEGRA-D) 60-120 MG per tablet Take 1 tablet by mouth every 12 (twelve) hours.  30 tablet  0  . folic acid (FOLVITE) 1 MG tablet Take 1 mg by mouth daily.      Marland Kitchen LORazepam (ATIVAN) 2 MG tablet Take 1 tablet (2 mg total) by mouth 3 (three) times daily as needed for anxiety.  30 tablet  0  . metoprolol tartrate (LOPRESSOR) 25 MG tablet Take 1 tablet (25 mg total) by mouth 2 (two) times daily.  60 tablet  1  . midodrine (PROAMATINE) 2.5 MG tablet Take 1 tablet (  2.5 mg total) by mouth 3 (three) times daily with meals.  90 tablet  0  . milk thistle 175 MG tablet Take 175 mg by mouth daily.      . Polyvinyl Alcohol-Povidone (REFRESH OP) Place 2 drops into both eyes as needed (dry eyes).      . Probiotic Product (RESTORA PO) Take 1 tablet by mouth daily.      . promethazine (PHENERGAN) 25 MG suppository Place 1 suppository (25 mg total) rectally every 6 (six) hours as needed for nausea.  24 each   0  . sucralfate (CARAFATE) 1 G tablet Take 1 tablet (1 g total) by mouth 4 (four) times daily.  120 tablet  0  . thiamine (VITAMIN B-1) 50 MG tablet Take 50 mg by mouth daily.       No current facility-administered medications for this visit.    Socially she is divorced. She does not have children. After her divorce she did have a significant history of EtOH use and this essentially has been discontinued. She also had some transient significant weight gain episodes  ROS is negative for fever chills night sweats. She denies syncope. She denies wheezing. She denies chest pressure. She does admit to some mild right upper quadrant residual discomfort. She denies easy bruisability or bleeding. She denies edema. She denies paresthesias. She denies visual symptoms. Other system review is negative.  PE BP 102/70  Pulse 87  Ht 5' 4.5" (1.638 m)  Wt 184 lb (83.462 kg)  BMI 31.11 kg/m2 additional orthostatic blood pressure by me was 100/70 supine and 110/70 standing, not demonstrating residual orthostatic hypotension. General: Alert, oriented, no distress.  HEENT: Normocephalic, atraumatic. Pupils round and reactive; sclera anicteric;no lid lag,  Nose without nasal septal hypertrophy Mouth/Parynx benign; Mallinpatti scale 2/3 Neck: No JVD, no carotid briuts Lungs: clear to ausculatation and percussion; no wheezing or rales Heart: RRR, s1 s2 normal 1/6 systolic murmur. Abdomen: Moderate central adiposity. She still has mild right upper quadrant tenderness. There is suggestion of palpable splenic tip on palpation, BS+; abdominal aorta nontender and not dilated by palpation. Pulses 2+ Extremities: no clubbing cyanosis or edema, Homan's sign negative  Neurologic: grossly nonfocal  ECG: Normal sinus rhythm without ectopy at 87 beats per minute. QTc interval 464 ms. Nonspecific ST changes  LABS:  BMET    Component Value Date/Time   NA 135 08/13/2012 0508   K 4.3 08/13/2012 0508   CL 104 08/13/2012  0508   CO2 22 08/13/2012 0508   GLUCOSE 94 08/13/2012 0508   BUN 5* 08/13/2012 0508   CREATININE 0.80 08/13/2012 0508   CALCIUM 8.1* 08/13/2012 0508   GFRNONAA >90 08/13/2012 0508   GFRAA >90 08/13/2012 0508     Hepatic Function Panel     Component Value Date/Time   PROT 5.4* 08/13/2012 0508   ALBUMIN 1.9* 08/13/2012 0508   AST 130* 08/13/2012 0508   ALT 79* 08/13/2012 0508   ALKPHOS 116 08/13/2012 0508   BILITOT 2.3* 08/13/2012 0508   BILIDIR 0.7* 08/06/2012 1845   IBILI 0.7 08/06/2012 1845     CBC    Component Value Date/Time   WBC 4.7 08/10/2012 0457   RBC 3.49* 08/10/2012 0457   HGB 12.2 08/10/2012 0457   HCT 36.4 08/10/2012 0457   PLT 121* 08/10/2012 0457   MCV 104.3* 08/10/2012 0457   MCH 35.0* 08/10/2012 0457   MCHC 33.5 08/10/2012 0457   RDW 13.6 08/10/2012 0457   LYMPHSABS 3.1 08/10/2012 0457  MONOABS 0.3 08/10/2012 0457   EOSABS 0.0 08/10/2012 0457   BASOSABS 0.1 08/10/2012 0457     BNP No results found for this basename: probnp    Lipid Panel  No results found for this basename: chol, trig, hdl, cholhdl, vldl, ldlcalc     RADIOLOGY: US Abdomen Complete  08/09/2012   *RADIOLOGY REPORT*  Clinical Data:  Mid abdominal and chest pain.  COMPLETE ABDOMINAL ULTRASOUND  Comparison:  08/07/2012 chest CT.  10/19/2011 ultrasound.  Findings:  Gallbladder:  Diffuse gallbladder wall thickening with mild pericholecystic fluid.  The patient was tender over this region during scanning.  This raises possibility of cholecystitis.  Other causes of gallbladder wall thickening such as metabolic abnormality or congestive heart failure felt to be secondary considerations. Clinical correlation recommended.  Common bile duct:  .  6.7 mm peri  Liver:  Enlarged and echogenic suggestive of diffuse fatty infiltration.  No obvious focal mass  IVC:  Limited by bowel gas.  Pancreas:  Limited by bowel gas.  Spleen:  Enlarged spanning over 15.7 cm with volume of 854.6 ml.  Right Kidney:  10.6 cm. No hydronephrosis or renal  mass.  Left Kidney:  10.3 cm. No hydronephrosis or renal mass.  Abdominal aorta:  Limited y bowel gas.  Maximal AP dimension obtained 1.7 cm.,  IMPRESSION: Diffuse gallbladder wall thickening with mild pericholecystic fluid.  The patient was tender over this region during scanning. This raises possibility of cholecystitis.  Enlarged fatty liver.  Splenomegaly.  Limited evaluation of the inferior vena cava, pancreas and aorta secondary to bowel gas.  Please see above.  This has been made a PRA call report utilizing dashboard call feature. .   Original Report Authenticated By: Lacy Duverney, M.D.   Mr 3d Recon At Scanner  08/10/2012   *RADIOLOGY REPORT*  Clinical Data:  Severe abdominal pain and nausea.  MRI ABDOMEN WITHOUT AND WITH CONTRAST (MRCP)  Technique:  Multiplanar multisequence MR imaging of the abdomen was performed without and with contrast, including heavily T2-weighted images of the biliary and pancreatic ducts.  Three-dimensional MR images were rendered by post processing of the original MR data.  Contrast: 20mL MULTIHANCE GADOBENATE DIMEGLUMINE 529 MG/ML IV SOLN  Comparison:  Ultrasound 08/09/2012.  Findings:  The liver demonstrates diffuse fatty infiltration but no focal hepatic lesion or intrahepatic ductal dilatation.  No definite findings for cirrhosis.  No focal hepatic lesions or intrahepatic biliary dilatation.  The common bile duct is normal in caliber and has a normal course.  The pancreatic duct is normal.  There is diffuse wall thickening and pericholecystic fluid involving the gallbladder as noted on the previous ultrasound.  No obvious explanation.  Recommend correlation with albumin level. Small pleural effusions are noted.  There is a small amount of free abdominal fluid also.  Significant splenomegaly is demonstrated.  The spleen measures 17.5 x 16.0 x 11.0 cm.  The pancreas is normal.  The adrenal glands and kidneys are normal.  There are small scattered mesenteric and retroperitoneal  lymph nodes but no mass or adenopathy.  A small amount of body wall edema is noted.  IMPRESSION:  1.  Pericholecystic fluid, small amount of abdominal ascites and mild body wall edema would suggest a systemic cause.  Recommend correlation with liver function studies,  albumin level and total protein. 2.  No gallstones or common bile duct stones.  The common bile duct and pancreatic ducts are normal. 3.  Marked splenomegaly.   Original Report Authenticated By: Rudie Meyer,  M.D.   Nm Hepato W/eject Fract  08/10/2012   *RADIOLOGY REPORT*  Clinical Data: Right upper quadrant pain and nausea.  Abnormal gallbladder ultrasound.  NUCLEAR MEDICINE HEPATOBILIARY WITH GB, PHARM AND QUAN MEASURE  Radiopharmaceutical:  5.4 mCi technetium Choletec IV.  8 ounces of Ensure orally.  Comparison: Ultrasound dated 08/09/2012  Findings: The gallbladder is visualized at 20 minutes.  There is activity in the bowel immediately after Ensure ingestion.  Ejection fraction was 47.2% at 59 minutes.  IMPRESSION: Normal hepatobiliary scan.  Normal ejection fraction.   Original Report Authenticated By: Francene Boyers, M.D.   Mr Abd W/wo Cm/mrcp  08/10/2012   *RADIOLOGY REPORT*  Clinical Data:  Severe abdominal pain and nausea.  MRI ABDOMEN WITHOUT AND WITH CONTRAST (MRCP)  Technique:  Multiplanar multisequence MR imaging of the abdomen was performed without and with contrast, including heavily T2-weighted images of the biliary and pancreatic ducts.  Three-dimensional MR images were rendered by post processing of the original MR data.  Contrast: 20mL MULTIHANCE GADOBENATE DIMEGLUMINE 529 MG/ML IV SOLN  Comparison:  Ultrasound 08/09/2012.  Findings:  The liver demonstrates diffuse fatty infiltration but no focal hepatic lesion or intrahepatic ductal dilatation.  No definite findings for cirrhosis.  No focal hepatic lesions or intrahepatic biliary dilatation.  The common bile duct is normal in caliber and has a normal course.  The pancreatic duct  is normal.  There is diffuse wall thickening and pericholecystic fluid involving the gallbladder as noted on the previous ultrasound.  No obvious explanation.  Recommend correlation with albumin level. Small pleural effusions are noted.  There is a small amount of free abdominal fluid also.  Significant splenomegaly is demonstrated.  The spleen measures 17.5 x 16.0 x 11.0 cm.  The pancreas is normal.  The adrenal glands and kidneys are normal.  There are small scattered mesenteric and retroperitoneal lymph nodes but no mass or adenopathy.  A small amount of body wall edema is noted.  IMPRESSION:  1.  Pericholecystic fluid, small amount of abdominal ascites and mild body wall edema would suggest a systemic cause.  Recommend correlation with liver function studies,  albumin level and total protein. 2.  No gallstones or common bile duct stones.  The common bile duct and pancreatic ducts are normal. 3.  Marked splenomegaly.   Original Report Authenticated By: Rudie Meyer, M.D.      ASSESSMENT AND PLAN: I did review Ms. Lillie's recent hospitalization records. Does have a remote history of documented supraventricular tachycardia for which he did transiently receive adenosine. She had been on when necessary beta blocker but now most recently she is doing well on daily dose of metoprolol titrate 25 mg twice a day. She no longer is orthostatic while taking her Midrin. Also was hospitalized, she did have a repeat echo Doppler study on 08/09/2012. This again showed an ejection fraction of 65-70%. There was mild grade 1 diastolic dysfunction and she had normal LV filling pressures. I have recommended she continue her cardiac medications. I understand her laboratory will be rechecked. While hospitalized she did have significant LFT elevation. She does have history of elevated triglycerides. She no longer is drinking alcohol. I will see her in 6 months for cardiology followup.     Lennette Bihari, MD, [facility]   09/07/2012 1:24 PM

## 2012-10-13 ENCOUNTER — Telehealth: Payer: Self-pay | Admitting: Gastroenterology

## 2012-10-13 ENCOUNTER — Encounter (INDEPENDENT_AMBULATORY_CARE_PROVIDER_SITE_OTHER): Payer: Self-pay | Admitting: Gastroenterology

## 2012-10-13 NOTE — Telephone Encounter (Signed)
REVIEWED.  

## 2012-10-13 NOTE — Telephone Encounter (Signed)
Patient was a no-show 

## 2012-10-14 NOTE — Progress Notes (Signed)
Error

## 2012-11-30 NOTE — Progress Notes (Signed)
PT NO SHOW x3 IN ONE YEAR: NOV 2013, MAY 2014, JUL 2014.   REVIEWED.

## 2013-04-28 ENCOUNTER — Encounter: Payer: Self-pay | Admitting: *Deleted

## 2013-05-02 ENCOUNTER — Encounter: Payer: Self-pay | Admitting: Cardiovascular Disease

## 2013-05-03 ENCOUNTER — Ambulatory Visit: Payer: Self-pay | Admitting: Cardiovascular Disease

## 2013-07-26 NOTE — Telephone Encounter (Signed)
Encounter has been closed--TP 07/26/13 

## 2013-08-13 ENCOUNTER — Emergency Department (HOSPITAL_COMMUNITY): Payer: Self-pay

## 2013-08-13 ENCOUNTER — Encounter (HOSPITAL_COMMUNITY): Payer: Self-pay | Admitting: Emergency Medicine

## 2013-08-13 ENCOUNTER — Emergency Department (HOSPITAL_COMMUNITY)
Admission: EM | Admit: 2013-08-13 | Discharge: 2013-08-13 | Disposition: A | Discharge status: Home / Self Care | Payer: Self-pay | Attending: Emergency Medicine | Admitting: Emergency Medicine

## 2013-08-13 DIAGNOSIS — I471 Supraventricular tachycardia, unspecified: Secondary | ICD-10-CM | POA: Insufficient documentation

## 2013-08-13 DIAGNOSIS — Z3202 Encounter for pregnancy test, result negative: Secondary | ICD-10-CM | POA: Insufficient documentation

## 2013-08-13 DIAGNOSIS — E039 Hypothyroidism, unspecified: Secondary | ICD-10-CM | POA: Insufficient documentation

## 2013-08-13 DIAGNOSIS — Z8669 Personal history of other diseases of the nervous system and sense organs: Secondary | ICD-10-CM | POA: Insufficient documentation

## 2013-08-13 DIAGNOSIS — R102 Pelvic and perineal pain: Secondary | ICD-10-CM

## 2013-08-13 DIAGNOSIS — R197 Diarrhea, unspecified: Secondary | ICD-10-CM | POA: Insufficient documentation

## 2013-08-13 DIAGNOSIS — Z79899 Other long term (current) drug therapy: Secondary | ICD-10-CM | POA: Insufficient documentation

## 2013-08-13 DIAGNOSIS — K219 Gastro-esophageal reflux disease without esophagitis: Secondary | ICD-10-CM | POA: Insufficient documentation

## 2013-08-13 DIAGNOSIS — F411 Generalized anxiety disorder: Secondary | ICD-10-CM | POA: Insufficient documentation

## 2013-08-13 DIAGNOSIS — R42 Dizziness and giddiness: Secondary | ICD-10-CM | POA: Insufficient documentation

## 2013-08-13 DIAGNOSIS — R509 Fever, unspecified: Secondary | ICD-10-CM | POA: Insufficient documentation

## 2013-08-13 DIAGNOSIS — R112 Nausea with vomiting, unspecified: Secondary | ICD-10-CM | POA: Insufficient documentation

## 2013-08-13 DIAGNOSIS — R000 Tachycardia, unspecified: Secondary | ICD-10-CM | POA: Insufficient documentation

## 2013-08-13 DIAGNOSIS — R1031 Right lower quadrant pain: Secondary | ICD-10-CM | POA: Insufficient documentation

## 2013-08-13 DIAGNOSIS — R630 Anorexia: Secondary | ICD-10-CM | POA: Insufficient documentation

## 2013-08-13 DIAGNOSIS — I10 Essential (primary) hypertension: Secondary | ICD-10-CM | POA: Insufficient documentation

## 2013-08-13 DIAGNOSIS — R51 Headache: Secondary | ICD-10-CM | POA: Insufficient documentation

## 2013-08-13 DIAGNOSIS — A5901 Trichomonal vulvovaginitis: Secondary | ICD-10-CM | POA: Insufficient documentation

## 2013-08-13 DIAGNOSIS — J45909 Unspecified asthma, uncomplicated: Secondary | ICD-10-CM | POA: Insufficient documentation

## 2013-08-13 DIAGNOSIS — Z880 Allergy status to penicillin: Secondary | ICD-10-CM | POA: Insufficient documentation

## 2013-08-13 LAB — COMPREHENSIVE METABOLIC PANEL
ALT: 38 U/L — AB (ref 0–35)
AST: 67 U/L — AB (ref 0–37)
Albumin: 3.9 g/dL (ref 3.5–5.2)
Alkaline Phosphatase: 84 U/L (ref 39–117)
BILIRUBIN TOTAL: 1.5 mg/dL — AB (ref 0.3–1.2)
BUN: 7 mg/dL (ref 6–23)
CALCIUM: 9.1 mg/dL (ref 8.4–10.5)
CHLORIDE: 101 meq/L (ref 96–112)
CO2: 23 meq/L (ref 19–32)
Creatinine, Ser: 0.7 mg/dL (ref 0.50–1.10)
GLUCOSE: 94 mg/dL (ref 70–99)
Potassium: 3.2 mEq/L — ABNORMAL LOW (ref 3.7–5.3)
SODIUM: 139 meq/L (ref 137–147)
Total Protein: 7.2 g/dL (ref 6.0–8.3)

## 2013-08-13 LAB — URINALYSIS, ROUTINE W REFLEX MICROSCOPIC
BILIRUBIN URINE: NEGATIVE
Glucose, UA: NEGATIVE mg/dL
Ketones, ur: 15 mg/dL — AB
Leukocytes, UA: NEGATIVE
Nitrite: NEGATIVE
PH: 5.5 (ref 5.0–8.0)
Urobilinogen, UA: 0.2 mg/dL (ref 0.0–1.0)

## 2013-08-13 LAB — CBC WITH DIFFERENTIAL/PLATELET
BASOS ABS: 0 10*3/uL (ref 0.0–0.1)
Basophils Relative: 0 % (ref 0–1)
EOS PCT: 1 % (ref 0–5)
Eosinophils Absolute: 0 10*3/uL (ref 0.0–0.7)
HEMATOCRIT: 40.8 % (ref 36.0–46.0)
Hemoglobin: 14.6 g/dL (ref 12.0–15.0)
LYMPHS ABS: 1.3 10*3/uL (ref 0.7–4.0)
LYMPHS PCT: 21 % (ref 12–46)
MCH: 36 pg — ABNORMAL HIGH (ref 26.0–34.0)
MCHC: 35.8 g/dL (ref 30.0–36.0)
MCV: 100.7 fL — AB (ref 78.0–100.0)
MONO ABS: 0.2 10*3/uL (ref 0.1–1.0)
Monocytes Relative: 3 % (ref 3–12)
NEUTROS ABS: 4.8 10*3/uL (ref 1.7–7.7)
Neutrophils Relative %: 75 % (ref 43–77)
PLATELETS: 167 10*3/uL (ref 150–400)
RBC: 4.05 MIL/uL (ref 3.87–5.11)
RDW: 12.5 % (ref 11.5–15.5)
WBC: 6.5 10*3/uL (ref 4.0–10.5)

## 2013-08-13 LAB — URINE MICROSCOPIC-ADD ON

## 2013-08-13 LAB — LIPASE, BLOOD: Lipase: 25 U/L (ref 11–59)

## 2013-08-13 LAB — WET PREP, GENITAL: YEAST WET PREP: NONE SEEN

## 2013-08-13 LAB — PREGNANCY, URINE: PREG TEST UR: NEGATIVE

## 2013-08-13 MED ORDER — ONDANSETRON HCL 4 MG/2ML IJ SOLN
4.0000 mg | Freq: Once | INTRAMUSCULAR | Status: AC
  Administered 2013-08-13: 4 mg via INTRAVENOUS
  Filled 2013-08-13: qty 2

## 2013-08-13 MED ORDER — METRONIDAZOLE 500 MG PO TABS: 500.0000 mg | ORAL_TABLET | Freq: Two times a day (BID) | ORAL | Status: DC

## 2013-08-13 MED ORDER — SODIUM CHLORIDE 0.9 % IV SOLN
INTRAVENOUS | Status: DC
Start: 2013-08-13 — End: 2013-08-13
  Administered 2013-08-13: 11:00:00 via INTRAVENOUS

## 2013-08-13 MED ORDER — AZITHROMYCIN 250 MG PO TABS
1000.0000 mg | ORAL_TABLET | Freq: Once | ORAL | Status: AC
  Administered 2013-08-13: 1000 mg via ORAL
  Filled 2013-08-13: qty 4

## 2013-08-13 MED ORDER — ONDANSETRON HCL 4 MG PO TABS: 4.0000 mg | ORAL_TABLET | Freq: Four times a day (QID) | ORAL | Status: DC

## 2013-08-13 MED ORDER — DOXYCYCLINE HYCLATE 100 MG PO CAPS: 100.0000 mg | ORAL_CAPSULE | Freq: Two times a day (BID) | ORAL | Status: DC

## 2013-08-13 NOTE — ED Notes (Signed)
MD at bedside. 

## 2013-08-13 NOTE — ED Notes (Signed)
NP at bedside at this time  

## 2013-08-13 NOTE — Discharge Instructions (Signed)
Take the medication as directed, no not drink alcohol. Follow up with Dr. Emelda FearFerguson, return as needed for worsening symptoms.

## 2013-08-13 NOTE — ED Notes (Signed)
Pt c/o right side abd pain, n/v/d, fever, for the past two days, palpitations that started during the night, is concerned that she may have become dehydrated,

## 2013-08-13 NOTE — ED Provider Notes (Signed)
  Medical screening examination/treatment/procedure(s) were performed by non-physician practitioner and as supervising physician I was immediately available for consultation/collaboration.    Gerhard Munchobert Ardella Chhim, MD 08/13/13 [phone]

## 2013-08-13 NOTE — ED Provider Notes (Signed)
CSN: [id number]     Arrival date & time 08/13/13  [id number] History   First MD Initiated Contact with Patient 08/13/13 [phone]     Chief Complaint  Patient presents with  . Emesis  . Abdominal Pain     (Consider location/radiation/quality/duration/timing/severity/associated sxs/prior Treatment) Patient is a 38 y.o. female presenting with abdominal pain. The history is provided by the patient.  Abdominal Pain Pain location:  RLQ and RUQ Pain quality: aching and sharp   Pain radiates to:  R flank Pain severity:  Moderate Onset quality:  Gradual Duration:  24 hours Timing:  Constant Progression:  Worsening Chronicity:  New Relieved by:  Nothing Worsened by:  Movement and vomiting Ineffective treatments:  None tried Associated symptoms: anorexia, diarrhea, fever, nausea and vomiting   Associated symptoms: no chills, no constipation, no cough, no dysuria, no fatigue, no hematemesis, no hematochezia, no hematuria, no sore throat, no vaginal bleeding and no vaginal discharge    Gary FleetStephanie Cowher is a 38 y.o. female who presents to the ED with nausea, vomiting and abdominal pain that started yesterday. Last night felt dehydrated and and felt like having palpations. Used her inhaler and felt some better.  Past Medical History  Diagnosis Date  . SVT (supraventricular tachycardia)   . Hiatal hernia   . Anxiety   . Acid reflux   . Dyspepsia 2004    Dx w/ PUD but no EGD, [place], [place]   . Hypertriglyceridemia   . Hypothyroid   . Iritis     frequent  . Hypertension   . Asthma   . Panic attacks    Past Surgical History  Procedure Laterality Date  . Esophagogastroduodenoscopy  11/06/2011    [name]: MILD Esophagitis/PATENT ESOPHAGEAL Stricture/  Moderate gastritis. Bx no.hpylori or celiac, +gastritis   Family History  Problem Relation Age of Onset  . Stomach cancer Paternal Grandfather   . Breast cancer Maternal Grandmother    History  Substance Use Topics  . Smoking status: Never Smoker   .  Smokeless tobacco: Not on file  . Alcohol Use: No     Comment: pt reports she quit x1 week.   OB History   Grav Para Term Preterm Abortions TAB SAB Ect Mult Living                 Review of Systems  Constitutional: Positive for fever. Negative for chills and fatigue.  HENT: Negative for sore throat.   Respiratory: Negative for cough.   Gastrointestinal: Positive for nausea, vomiting, abdominal pain, diarrhea and anorexia. Negative for constipation, hematochezia and hematemesis.  Genitourinary: Negative for dysuria, hematuria, vaginal bleeding and vaginal discharge.  Skin: Negative for rash.  Neurological: Positive for dizziness and headaches.      Allergies  Penicillins  Home Medications   Prior to Admission medications   Medication Sig Start Date End Date Taking? Authorizing Provider  albuterol (PROVENTIL HFA;VENTOLIN HFA) 108 (90 BASE) MCG/ACT inhaler Inhale 2 puffs into the lungs every 6 (six) hours as needed for wheezing or shortness of breath.    Historical Provider, MD  dexlansoprazole (DEXILANT) 60 MG capsule Take 60 mg by mouth daily. 07/21/12   Tiffany KocherLeslie S Lewis, PA-C  fexofenadine-pseudoephedrine (ALLEGRA-D) 60-120 MG per tablet Take 1 tablet by mouth every 12 (twelve) hours. 08/04/12   Kathie DikeHobson M Bryant, PA-C  folic acid (FOLVITE) 1 MG tablet Take 1 mg by mouth daily.    Historical Provider, MD  LORazepam (ATIVAN) 2 MG tablet Take 1 tablet (2 mg total)  by mouth 3 (three) times daily as needed for anxiety. 08/13/12   Renae FickleMackenzie Short, MD  metoprolol tartrate (LOPRESSOR) 25 MG tablet Take 1 tablet (25 mg total) by mouth 2 (two) times daily. 08/13/12   Renae FickleMackenzie Short, MD  midodrine (PROAMATINE) 2.5 MG tablet Take 1 tablet (2.5 mg total) by mouth 3 (three) times daily with meals. 08/13/12   Renae FickleMackenzie Short, MD  milk thistle 175 MG tablet Take 175 mg by mouth daily.    Historical Provider, MD  Polyvinyl Alcohol-Povidone (REFRESH OP) Place 2 drops into both eyes as needed (dry eyes).     Historical Provider, MD  Probiotic Product (RESTORA PO) Take 1 tablet by mouth daily.    Historical Provider, MD  promethazine (PHENERGAN) 25 MG suppository Place 1 suppository (25 mg total) rectally every 6 (six) hours as needed for nausea. 08/13/12   Renae FickleMackenzie Short, MD  sucralfate (CARAFATE) 1 G tablet Take 1 tablet (1 g total) by mouth 4 (four) times daily. 08/13/12   Renae FickleMackenzie Short, MD  thiamine (VITAMIN B-1) 50 MG tablet Take 50 mg by mouth daily.    Historical Provider, MD   BP 116/79  Pulse 75  Temp(Src) 98.4 F (36.9 C) (Oral)  Resp 18  Ht 5\' 4"  (1.626 m)  Wt 160 lb (72.576 kg)  BMI 27.45 kg/m2  SpO2 100%  LMP 07/24/2013 Physical Exam  Nursing note and vitals reviewed. Constitutional: She is oriented to person, place, and time. She appears well-developed and well-nourished.  HENT:  Head: Normocephalic.  Eyes: EOM are normal.  Neck: Neck supple.  Cardiovascular: Tachycardia present.   Pulmonary/Chest: Effort normal.  Abdominal: Soft. There is tenderness in the right lower quadrant and suprapubic area. There is CVA tenderness (right). There is no rebound and no guarding.  Genitourinary:  External genitalia without lesions, bloody, purulent discharge from cervix. Mild CMT, left adnexal tenderness. Uterus without palpable enlargement.    Musculoskeletal: Normal range of motion.  Neurological: She is alert and oriented to person, place, and time. No cranial nerve deficit.  Skin: Skin is warm and dry.  Psychiatric: She has a normal mood and affect. Her behavior is normal.    ED Course  Procedures IV hydration, Zofran 4 mg IV, ultrasound, labs, Zithromax 1 gram PO  Results for orders placed during the hospital encounter of 08/13/13 (from the past 24 hour(s))  URINALYSIS, ROUTINE W REFLEX MICROSCOPIC     Status: Abnormal   Collection Time    08/13/13 10:00 AM      Result Value Ref Range   Color, Urine ORANGE (*) YELLOW   APPearance CLOUDY (*) CLEAR   Specific Gravity,  Urine >1.030 (*) 1.005 - 1.030   pH 5.5  5.0 - 8.0   Glucose, UA NEGATIVE  NEGATIVE mg/dL   Hgb urine dipstick LARGE (*) NEGATIVE   Bilirubin Urine NEGATIVE  NEGATIVE   Ketones, ur 15 (*) NEGATIVE mg/dL   Protein, ur TRACE (*) NEGATIVE mg/dL   Urobilinogen, UA 0.2  0.0 - 1.0 mg/dL   Nitrite NEGATIVE  NEGATIVE   Leukocytes, UA NEGATIVE  NEGATIVE  PREGNANCY, URINE     Status: None   Collection Time    08/13/13 10:00 AM      Result Value Ref Range   Preg Test, Ur NEGATIVE  NEGATIVE  URINE MICROSCOPIC-ADD ON     Status: Abnormal   Collection Time    08/13/13 10:00 AM      Result Value Ref Range   Squamous Epithelial / LPF  FEW (*) RARE   WBC, UA 3-6  <3 WBC/hpf   RBC / HPF 0-2  <3 RBC/hpf   Bacteria, UA MANY (*) RARE   Urine-Other TRICHOMONAS PRESENT    CBC WITH DIFFERENTIAL     Status: Abnormal   Collection Time    08/13/13 10:24 AM      Result Value Ref Range   WBC 6.5  4.0 - 10.5 K/uL   RBC 4.05  3.87 - 5.11 MIL/uL   Hemoglobin 14.6  12.0 - 15.0 g/dL   HCT 16.1  09.6 - 04.5 %   MCV 100.7 (*) 78.0 - 100.0 fL   MCH 36.0 (*) 26.0 - 34.0 pg   MCHC 35.8  30.0 - 36.0 g/dL   RDW 40.9  81.1 - 91.4 %   Platelets 167  150 - 400 K/uL   Neutrophils Relative % 75  43 - 77 %   Neutro Abs 4.8  1.7 - 7.7 K/uL   Lymphocytes Relative 21  12 - 46 %   Lymphs Abs 1.3  0.7 - 4.0 K/uL   Monocytes Relative 3  3 - 12 %   Monocytes Absolute 0.2  0.1 - 1.0 K/uL   Eosinophils Relative 1  0 - 5 %   Eosinophils Absolute 0.0  0.0 - 0.7 K/uL   Basophils Relative 0  0 - 1 %   Basophils Absolute 0.0  0.0 - 0.1 K/uL  COMPREHENSIVE METABOLIC PANEL     Status: Abnormal   Collection Time    08/13/13 10:24 AM      Result Value Ref Range   Sodium 139  137 - 147 mEq/L   Potassium 3.2 (*) 3.7 - 5.3 mEq/L   Chloride 101  96 - 112 mEq/L   CO2 23  19 - 32 mEq/L   Glucose, Bld 94  70 - 99 mg/dL   BUN 7  6 - 23 mg/dL   Creatinine, Ser 7.82  0.50 - 1.10 mg/dL   Calcium 9.1  8.4 - 95.6 mg/dL   Total Protein  7.2  6.0 - 8.3 g/dL   Albumin 3.9  3.5 - 5.2 g/dL   AST 67 (*) 0 - 37 U/L   ALT 38 (*) 0 - 35 U/L   Alkaline Phosphatase 84  39 - 117 U/L   Total Bilirubin 1.5 (*) 0.3 - 1.2 mg/dL   GFR calc non Af Amer >90  >90 mL/min   GFR calc Af Amer >90  >90 mL/min  LIPASE, BLOOD     Status: None   Collection Time    08/13/13 10:24 AM      Result Value Ref Range   Lipase 25  11 - 59 U/L  WET PREP, GENITAL     Status: Abnormal   Collection Time    08/13/13 11:00 AM      Result Value Ref Range   Yeast Wet Prep HPF POC NONE SEEN  NONE SEEN   Trich, Wet Prep MANY (*) NONE SEEN   Clue Cells Wet Prep HPF POC FEW (*) NONE SEEN   WBC, Wet Prep HPF POC MANY (*) NONE SEEN    US Transvaginal Non-ob  08/13/2013   CLINICAL DATA:  pelvic pain pelvic pain; PELVIC PAIN PELVIC PAIN  EXAM: TRANSABDOMINAL AND TRANSVAGINAL ULTRASOUND OF PELVIS  TECHNIQUE: Both transabdominal and transvaginal ultrasound examinations of the pelvis were performed. Transabdominal technique was performed for global imaging of the pelvis including uterus, ovaries, adnexal regions, and pelvic cul-de-sac. It was necessary  to proceed with endovaginal exam following the transabdominal exam to visualize the .  COMPARISON:  None  FINDINGS: Uterus  Measurements: 86 x 32 x 50 mm. No fibroids or other mass visualized.  Endometrium  Thickness: 1.2 mm.  No focal abnormality visualized.  Right ovary  Measurements: 29 x 19 x 18 mm. Normal follicles.  No solid mass.  Left ovary  Measurements: 20 x 17 x 16 mm. Normal follicles.  No mass.  Other findings  No free fluid.  IMPRESSION: 1. Negative for mass or other acute abnormality.   Electronically Signed   By: Oley Balm M.D.   On: 08/13/2013 12:20   US Pelvis Complete  08/13/2013   CLINICAL DATA:  pelvic pain pelvic pain; PELVIC PAIN PELVIC PAIN  EXAM: TRANSABDOMINAL AND TRANSVAGINAL ULTRASOUND OF PELVIS  TECHNIQUE: Both transabdominal and transvaginal ultrasound examinations of the pelvis were  performed. Transabdominal technique was performed for global imaging of the pelvis including uterus, ovaries, adnexal regions, and pelvic cul-de-sac. It was necessary to proceed with endovaginal exam following the transabdominal exam to visualize the .  COMPARISON:  None  FINDINGS: Uterus  Measurements: 86 x 32 x 50 mm. No fibroids or other mass visualized.  Endometrium  Thickness: 1.2 mm.  No focal abnormality visualized.  Right ovary  Measurements: 29 x 19 x 18 mm. Normal follicles.  No solid mass.  Left ovary  Measurements: 20 x 17 x 16 mm. Normal follicles.  No mass.  Other findings  No free fluid.  IMPRESSION: 1. Negative for mass or other acute abnormality.   Electronically Signed   By: Oley Balm M.D.   On: 08/13/2013 12:20    MDM  After IV hydration and medications patient feeling better, abdomen soft, minimal tenderness right, no nausea. Stable for discharge with normal labs and ultrasound. I have reviewed this patient's vital signs, nurses notes, appropriate labs and imaging.  I have discussed findings with the patient and plan of care. She voices understanding. She will follow up with Dr. Emelda Fear. Patient's partner will get treatment for trich from his PCP or the health department.    Medication List    TAKE these medications       doxycycline 100 MG capsule  Commonly known as:  VIBRAMYCIN  Take 1 capsule (100 mg total) by mouth 2 (two) times daily.     metroNIDAZOLE 500 MG tablet  Commonly known as:  FLAGYL  Take 1 tablet (500 mg total) by mouth 2 (two) times daily.     ondansetron 4 MG tablet  Commonly known as:  ZOFRAN  Take 1 tablet (4 mg total) by mouth every 6 (six) hours.      ASK your doctor about these medications       albuterol 108 (90 BASE) MCG/ACT inhaler  Commonly known as:  PROVENTIL HFA;VENTOLIN HFA  Inhale 2 puffs into the lungs every 6 (six) hours as needed for wheezing or shortness of breath.     dexlansoprazole 60 MG capsule  Commonly known as:   DEXILANT  Take 60 mg by mouth daily.     folic acid 1 MG tablet  Commonly known as:  FOLVITE  Take 1 mg by mouth daily.     LORazepam 2 MG tablet  Commonly known as:  ATIVAN  Take 1 tablet (2 mg total) by mouth 3 (three) times daily as needed for anxiety.     metoprolol tartrate 25 MG tablet  Commonly known as:  LOPRESSOR  Take 25 mg  by mouth daily as needed (for palpitations).     milk thistle 175 MG tablet  Take 175 mg by mouth daily.     REFRESH OP  Place 2 drops into both eyes as needed (dry eyes).     RESTORA PO  Take 1 tablet by mouth daily.     sucralfate 1 G tablet  Commonly known as:  CARAFATE  Take 1 tablet (1 g total) by mouth 4 (four) times daily.     thiamine 50 MG tablet  Commonly known as:  VITAMIN B-1  Take 50 mg by mouth daily.           [facility] Orlene Och, NP 08/13/13 1320

## 2013-08-14 LAB — GC/CHLAMYDIA PROBE AMP
CT PROBE, AMP APTIMA: NEGATIVE
GC Probe RNA: NEGATIVE

## 2013-11-02 ENCOUNTER — Emergency Department (HOSPITAL_COMMUNITY): Payer: No Typology Code available for payment source

## 2013-11-02 ENCOUNTER — Encounter (HOSPITAL_COMMUNITY): Payer: Self-pay | Admitting: Emergency Medicine

## 2013-11-02 ENCOUNTER — Emergency Department (HOSPITAL_COMMUNITY)
Admission: EM | Admit: 2013-11-02 | Discharge: 2013-11-02 | Disposition: A | Discharge status: Home / Self Care | Payer: No Typology Code available for payment source | Attending: Emergency Medicine | Admitting: Emergency Medicine

## 2013-11-02 DIAGNOSIS — F411 Generalized anxiety disorder: Secondary | ICD-10-CM | POA: Insufficient documentation

## 2013-11-02 DIAGNOSIS — Z8639 Personal history of other endocrine, nutritional and metabolic disease: Secondary | ICD-10-CM | POA: Insufficient documentation

## 2013-11-02 DIAGNOSIS — Y9389 Activity, other specified: Secondary | ICD-10-CM | POA: Insufficient documentation

## 2013-11-02 DIAGNOSIS — Z862 Personal history of diseases of the blood and blood-forming organs and certain disorders involving the immune mechanism: Secondary | ICD-10-CM | POA: Insufficient documentation

## 2013-11-02 DIAGNOSIS — J45909 Unspecified asthma, uncomplicated: Secondary | ICD-10-CM | POA: Insufficient documentation

## 2013-11-02 DIAGNOSIS — Z79899 Other long term (current) drug therapy: Secondary | ICD-10-CM | POA: Insufficient documentation

## 2013-11-02 DIAGNOSIS — Y92410 Unspecified street and highway as the place of occurrence of the external cause: Secondary | ICD-10-CM | POA: Insufficient documentation

## 2013-11-02 DIAGNOSIS — S20219A Contusion of unspecified front wall of thorax, initial encounter: Secondary | ICD-10-CM | POA: Insufficient documentation

## 2013-11-02 DIAGNOSIS — Z880 Allergy status to penicillin: Secondary | ICD-10-CM | POA: Insufficient documentation

## 2013-11-02 DIAGNOSIS — S139XXA Sprain of joints and ligaments of unspecified parts of neck, initial encounter: Secondary | ICD-10-CM | POA: Insufficient documentation

## 2013-11-02 DIAGNOSIS — S20212A Contusion of left front wall of thorax, initial encounter: Secondary | ICD-10-CM

## 2013-11-02 DIAGNOSIS — I10 Essential (primary) hypertension: Secondary | ICD-10-CM | POA: Insufficient documentation

## 2013-11-02 DIAGNOSIS — K219 Gastro-esophageal reflux disease without esophagitis: Secondary | ICD-10-CM | POA: Insufficient documentation

## 2013-11-02 DIAGNOSIS — S161XXA Strain of muscle, fascia and tendon at neck level, initial encounter: Secondary | ICD-10-CM

## 2013-11-02 MED ORDER — OXYCODONE HCL 10 MG PO TABS: 5.0000 mg | ORAL_TABLET | Freq: Four times a day (QID) | ORAL | Status: DC | PRN

## 2013-11-02 MED ORDER — OXYCODONE-ACETAMINOPHEN 5-325 MG PO TABS
2.0000 | ORAL_TABLET | Freq: Once | ORAL | Status: AC
  Administered 2013-11-02: 2 via ORAL
  Filled 2013-11-02: qty 2

## 2013-11-02 MED ORDER — IBUPROFEN 400 MG PO TABS
600.0000 mg | ORAL_TABLET | Freq: Once | ORAL | Status: AC
  Administered 2013-11-02: 600 mg via ORAL
  Filled 2013-11-02 (×2): qty 1

## 2013-11-02 MED ORDER — OXYCODONE HCL 5 MG PO TABS
10.0000 mg | ORAL_TABLET | Freq: Once | ORAL | Status: DC
  Filled 2013-11-02: qty 2

## 2013-11-02 NOTE — ED Notes (Signed)
Patient transported to X-ray 

## 2013-11-02 NOTE — Discharge Instructions (Signed)
Cervical Sprain °A cervical sprain is an injury in the neck in which the strong, fibrous tissues (ligaments) that connect your neck bones stretch or tear. Cervical sprains can range from mild to severe. Severe cervical sprains can cause the neck vertebrae to be unstable. This can lead to damage of the spinal cord and can result in serious nervous system problems. The amount of time it takes for a cervical sprain to get better depends on the cause and extent of the injury. Most cervical sprains heal in 1 to 3 weeks. °CAUSES  °Severe cervical sprains may be caused by:  °· Contact sport injuries (such as from football, rugby, wrestling, hockey, auto racing, gymnastics, diving, martial arts, or boxing).   °· Motor vehicle collisions.   °· Whiplash injuries. This is an injury from a sudden forward and backward whipping movement of the head and neck.  °· Falls.   °Mild cervical sprains may be caused by:  °· Being in an awkward position, such as while cradling a telephone between your ear and shoulder.   °· Sitting in a chair that does not offer proper support.   °· Working at a poorly designed computer station.   °· Looking up or down for long periods of time.   °SYMPTOMS  °· Pain, soreness, stiffness, or a burning sensation in the front, back, or sides of the neck. This discomfort may develop immediately after the injury or slowly, 24 hours or more after the injury.   °· Pain or tenderness directly in the middle of the back of the neck.   °· Shoulder or upper back pain.   °· Limited ability to move the neck.   °· Headache.   °· Dizziness.   °· Weakness, numbness, or tingling in the hands or arms.   °· Muscle spasms.   °· Difficulty swallowing or chewing.   °· Tenderness and swelling of the neck.   °DIAGNOSIS  °Most of the time your health care provider can diagnose a cervical sprain by taking your history and doing a physical exam. Your health care provider will ask about previous neck injuries and any known neck  problems, such as arthritis in the neck. X-rays may be taken to find out if there are any other problems, such as with the bones of the neck. Other tests, such as a CT scan or MRI, may also be needed.  °TREATMENT  °Treatment depends on the severity of the cervical sprain. Mild sprains can be treated with rest, keeping the neck in place (immobilization), and pain medicines. Severe cervical sprains are immediately immobilized. Further treatment is done to help with pain, muscle spasms, and other symptoms and may include: °· Medicines, such as pain relievers, numbing medicines, or muscle relaxants.   °· Physical therapy. This may involve stretching exercises, strengthening exercises, and posture training. Exercises and improved posture can help stabilize the neck, strengthen muscles, and help stop symptoms from returning.   °HOME CARE INSTRUCTIONS  °· Put ice on the injured area.   °¨ Put ice in a plastic bag.   °¨ Place a towel between your skin and the bag.   °¨ Leave the ice on for 15-20 minutes, 3-4 times a day.   °· If your injury was severe, you may have been given a cervical collar to wear. A cervical collar is a two-piece collar designed to keep your neck from moving while it heals. °¨ Do not remove the collar unless instructed by your health care provider. °¨ If you have long hair, keep it outside of the collar. °¨ Ask your health care provider before making any adjustments to your collar. Minor   adjustments may be required over time to improve comfort and reduce pressure on your chin or on the back of your head.  Ifyou are allowed to remove the collar for cleaning or bathing, follow your health care provider's instructions on how to do so safely.  Keep your collar clean by wiping it with mild soap and water and drying it completely. If the collar you have been given includes removable pads, remove them every 1-2 days and hand wash them with soap and water. Allow them to air dry. They should be completely  dry before you wear them in the collar.  If you are allowed to remove the collar for cleaning and bathing, wash and dry the skin of your neck. Check your skin for irritation or sores. If you see any, tell your health care provider.  Do not drive while wearing the collar.   Only take over-the-counter or prescription medicines for pain, discomfort, or fever as directed by your health care provider.   Keep all follow-up appointments as directed by your health care provider.   Keep all physical therapy appointments as directed by your health care provider.   Make any needed adjustments to your workstation to promote good posture.   Avoid positions and activities that make your symptoms worse.   Warm up and stretch before being active to help prevent problems.  SEEK MEDICAL CARE IF:   Your pain is not controlled with medicine.   You are unable to decrease your pain medicine over time as planned.   Your activity level is not improving as expected.  SEEK IMMEDIATE MEDICAL CARE IF:   You develop any bleeding.  You develop stomach upset.  You have signs of an allergic reaction to your medicine.   Your symptoms get worse.   You develop new, unexplained symptoms.   You have numbness, tingling, weakness, or paralysis in any part of your body.  MAKE SURE YOU:   Understand these instructions.  Will watch your condition.  Will get help right away if you are not doing well or get worse. Document Released: 01/18/2007 Document Revised: 03/28/2013 Document Reviewed: 09/28/2012 [facility] Patient Information 2015 [employer], [place]. This information is not intended to replace advice given to you by your health care provider. Make sure you discuss any questions you have with your health care provider. Your chest xray and clavicle film are normal
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
## 2013-11-02 NOTE — ED Notes (Signed)
Family at bedside. 
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
## 2013-11-03 NOTE — ED Provider Notes (Signed)
Medical screening examination/treatment/procedure(s) were performed by non-physician practitioner and as supervising physician I was immediately available for consultation/collaboration.   EKG Interpretation None        Richardean Canalavid H Yao, MD 11/03/13 [phone]

## 2013-11-16 NOTE — ED Provider Notes (Signed)
Medical screening examination/treatment/procedure(s) were performed by non-physician practitioner and as supervising physician I was immediately available for consultation/collaboration.   EKG Interpretation   Date/Time:  Thursday November 02 2013 18:36:44 EDT Ventricular Rate:  101 PR Interval:  134 QRS Duration: 86 QT Interval:  350 QTC Calculation: 453 R Axis:   9 Text Interpretation:  Sinus tachycardia Minimal voltage criteria for LVH,  may be normal variant Nonspecific T wave abnormality Abnormal ECG ED  PHYSICIAN INTERPRETATION AVAILABLE IN [facility] Confirmed by TEST,  Record ([id number]) on 11/04/2013 1:36:35 PM        Richardean Canal, MD 11/16/13 [phone]

## 2014-01-02 ENCOUNTER — Other Ambulatory Visit: Payer: Self-pay | Admitting: Adult Health

## 2014-01-10 ENCOUNTER — Ambulatory Visit: Payer: 59 | Admitting: Advanced Practice Midwife

## 2014-01-11 ENCOUNTER — Encounter: Payer: Self-pay | Admitting: Adult Health

## 2014-01-11 ENCOUNTER — Ambulatory Visit (INDEPENDENT_AMBULATORY_CARE_PROVIDER_SITE_OTHER): Payer: 59 | Admitting: Adult Health

## 2014-01-11 ENCOUNTER — Telehealth: Payer: Self-pay | Admitting: *Deleted

## 2014-01-11 VITALS — BP 120/82 | Ht 63.5 in | Wt 166.0 lb

## 2014-01-11 DIAGNOSIS — N926 Irregular menstruation, unspecified: Secondary | ICD-10-CM

## 2014-01-11 DIAGNOSIS — N898 Other specified noninflammatory disorders of vagina: Secondary | ICD-10-CM

## 2014-01-11 DIAGNOSIS — R635 Abnormal weight gain: Secondary | ICD-10-CM | POA: Insufficient documentation

## 2014-01-11 DIAGNOSIS — A5901 Trichomonal vulvovaginitis: Secondary | ICD-10-CM

## 2014-01-11 DIAGNOSIS — Z3202 Encounter for pregnancy test, result negative: Secondary | ICD-10-CM

## 2014-01-11 DIAGNOSIS — E041 Nontoxic single thyroid nodule: Secondary | ICD-10-CM

## 2014-01-11 DIAGNOSIS — R1031 Right lower quadrant pain: Secondary | ICD-10-CM

## 2014-01-11 HISTORY — DX: Irregular menstruation, unspecified: N92.6

## 2014-01-11 HISTORY — DX: Nontoxic single thyroid nodule: E04.1

## 2014-01-11 LAB — POCT WET PREP (WET MOUNT)
Trichomonas Wet Prep HPF POC: POSITIVE
WBC, Wet Prep HPF POC: POSITIVE

## 2014-01-11 LAB — CBC
HCT: 38.8 % (ref 36.0–46.0)
Hemoglobin: 13.5 g/dL (ref 12.0–15.0)
MCH: 34.8 pg — ABNORMAL HIGH (ref 26.0–34.0)
MCHC: 34.8 g/dL (ref 30.0–36.0)
MCV: 100 fL (ref 78.0–100.0)
PLATELETS: 202 10*3/uL (ref 150–400)
RBC: 3.88 MIL/uL (ref 3.87–5.11)
RDW: 14.9 % (ref 11.5–15.5)
WBC: 5.5 10*3/uL (ref 4.0–10.5)

## 2014-01-11 LAB — COMPREHENSIVE METABOLIC PANEL
ALT: 24 U/L (ref 0–35)
AST: 48 U/L — ABNORMAL HIGH (ref 0–37)
Albumin: 4.1 g/dL (ref 3.5–5.2)
Alkaline Phosphatase: 60 U/L (ref 39–117)
BILIRUBIN TOTAL: 1.3 mg/dL — AB (ref 0.2–1.2)
BUN: 6 mg/dL (ref 6–23)
CO2: 23 mEq/L (ref 19–32)
Calcium: 9 mg/dL (ref 8.4–10.5)
Chloride: 103 mEq/L (ref 96–112)
Creat: 0.68 mg/dL (ref 0.50–1.10)
Glucose, Bld: 91 mg/dL (ref 70–99)
Potassium: 4.2 mEq/L (ref 3.5–5.3)
SODIUM: 138 meq/L (ref 135–145)
TOTAL PROTEIN: 6.6 g/dL (ref 6.0–8.3)

## 2014-01-11 LAB — POCT URINALYSIS DIPSTICK
Blood, UA: NEGATIVE
Glucose, UA: NEGATIVE
Leukocytes, UA: NEGATIVE
Nitrite, UA: NEGATIVE

## 2014-01-11 LAB — POCT URINE PREGNANCY: Preg Test, Ur: NEGATIVE

## 2014-01-11 LAB — TSH: TSH: 1.691 u[IU]/mL (ref 0.350–4.500)

## 2014-01-11 MED ORDER — METRONIDAZOLE 500 MG PO TABS: ORAL_TABLET | ORAL | Status: DC

## 2014-01-11 MED ORDER — ONDANSETRON HCL 8 MG PO TABS: 8.0000 mg | ORAL_TABLET | Freq: Three times a day (TID) | ORAL | Status: DC | PRN

## 2014-01-11 NOTE — Telephone Encounter (Signed)
Pt is requesting a nausea med.  Thanks!! JSY

## 2014-01-11 NOTE — Patient Instructions (Signed)
Bacterial Vaginosis Bacterial vaginosis is a vaginal infection that occurs when the normal balance of bacteria in the vagina is disrupted. It results from an overgrowth of certain bacteria. This is the most common vaginal infection in women of childbearing age. Treatment is important to prevent complications, especially in pregnant women, as it can cause a premature delivery. CAUSES  Bacterial vaginosis is caused by an increase in harmful bacteria that are normally present in smaller amounts in the vagina. Several different kinds of bacteria can cause bacterial vaginosis. However, the reason that the condition develops is not fully understood. RISK FACTORS Certain activities or behaviors can put you at an increased risk of developing bacterial vaginosis, including:  Having a new sex partner or multiple sex partners.  Douching.  Using an intrauterine device (IUD) for contraception. Women do not get bacterial vaginosis from toilet seats, bedding, swimming pools, or contact with objects around them. SIGNS AND SYMPTOMS  Some women with bacterial vaginosis have no signs or symptoms. Common symptoms include:  Grey vaginal discharge.  A fishlike odor with discharge, especially after sexual intercourse.  Itching or burning of the vagina and vulva.  Burning or pain with urination. DIAGNOSIS  Your health care provider will take a medical history and examine the vagina for signs of bacterial vaginosis. A sample of vaginal fluid may be taken. Your health care provider will look at this sample under a microscope to check for bacteria and abnormal cells. A vaginal pH test may also be done.  TREATMENT  Bacterial vaginosis may be treated with antibiotic medicines. These may be given in the form of a pill or a vaginal cream. A second round of antibiotics may be prescribed if the condition comes back after treatment.  HOME CARE INSTRUCTIONS   Only take over-the-counter or prescription medicines as  directed by your health care provider.  If antibiotic medicine was prescribed, take it as directed. Make sure you finish it even if you start to feel better.  Do not have sex until treatment is completed.  Tell all sexual partners that you have a vaginal infection. They should see their health care provider and be treated if they have problems, such as a mild rash or itching.  Practice safe sex by using condoms and only having one sex partner. SEEK MEDICAL CARE IF:   Your symptoms are not improving after 3 days of treatment.  You have increased discharge or pain.  You have a fever. MAKE SURE YOU:   Understand these instructions.  Will watch your condition.  Will get help right away if you are not doing well or get worse. FOR MORE INFORMATION  Centers for Disease Control and Prevention, Division of STD Prevention: SolutionApps.co.zawww.cdc.gov/std American Sexual Health Association (ASHA): www.ashastd.org  Document Released: 03/23/2005 Document Revised: 01/11/2013 Document Reviewed: 11/02/2012 [facility] Patient Information 2015 [place], [place]. This information is not intended to replace advice given to you by your health care provider. Make sure you discuss any questions you have with your health care provider. Return 10.14 for US and bring records

## 2014-01-11 NOTE — Telephone Encounter (Signed)
Called wants something for nausea will rx zofran
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
## 2014-01-12 ENCOUNTER — Telehealth: Payer: Self-pay | Admitting: Adult Health

## 2014-01-12 LAB — URINALYSIS
Bilirubin Urine: NEGATIVE
GLUCOSE, UA: NEGATIVE mg/dL
Hgb urine dipstick: NEGATIVE
Nitrite: NEGATIVE
PH: 6 (ref 5.0–8.0)
Protein, ur: NEGATIVE mg/dL
SPECIFIC GRAVITY, URINE: 1.026 (ref 1.005–1.030)
Urobilinogen, UA: 0.2 mg/dL (ref 0.0–1.0)

## 2014-01-12 LAB — GC/CHLAMYDIA PROBE AMP
CT PROBE, AMP APTIMA: NEGATIVE
GC PROBE AMP APTIMA: NEGATIVE

## 2014-01-12 NOTE — Telephone Encounter (Signed)
Pt aware of labs, keep appt.

## 2014-01-13 LAB — URINE CULTURE
Colony Count: NO GROWTH
Organism ID, Bacteria: NO GROWTH

## 2014-01-17 ENCOUNTER — Ambulatory Visit (INDEPENDENT_AMBULATORY_CARE_PROVIDER_SITE_OTHER): Payer: 59 | Admitting: Adult Health

## 2014-01-17 ENCOUNTER — Ambulatory Visit (INDEPENDENT_AMBULATORY_CARE_PROVIDER_SITE_OTHER): Payer: 59

## 2014-01-17 ENCOUNTER — Encounter: Payer: Self-pay | Admitting: Adult Health

## 2014-01-17 VITALS — BP 114/68 | Ht 64.0 in | Wt 166.0 lb

## 2014-01-17 DIAGNOSIS — R1031 Right lower quadrant pain: Secondary | ICD-10-CM

## 2014-01-17 NOTE — Progress Notes (Signed)
Subjective:     Patient ID: Eston EstersStephanie Pocius-Winchester, female   DOB: Jul 25, 1975, 38 y.o.   MRN: [id number]  HPI [name] is a 38 year old white female in for US for RLQ pain.She forgot to bring reports on thyroid.  Review of Systems See HPI Reviewed past medical,surgical, social and family history. Reviewed medications and allergies.     Objective:   Physical Exam BP 114/68  Ht 5\' 4"  (1.626 m)  Wt 166 lb (75.297 kg)  BMI 28.48 kg/m2  LMP 09/10/2015Reviewed US with pt and her labs.   Uterus 7.3 x 5.8 x 4.6 cm, anteverted no myometrial masses noted  Endometrium 3.4 mm, symmetrical,  Right ovary 3.6 x 2.9 x 2.7 cm, +perfusion noted  Left ovary 2.5 x 1.5 x 1.5 cm,  No free fluid noted withinthte pelvis  *Rt kidney appears WNL no hydronephrosis noted  Technician Comments:  Anteverted uterus, Endom-3.174mm symmetrical, Rt ovary with +Perfusion noted, Lt ovary appears WNL, no free fluid noted within the pelvis     Assessment:     RLQ pain(normal US)    Plan:     [facility]  Return 10/20 for proof of cure for recent trich

## 2014-01-17 NOTE — Patient Instructions (Signed)
Bring records about thyroid  Return 10/20 for proof of treatment

## 2014-01-23 ENCOUNTER — Ambulatory Visit (INDEPENDENT_AMBULATORY_CARE_PROVIDER_SITE_OTHER): Payer: 59 | Admitting: Adult Health

## 2014-01-23 ENCOUNTER — Encounter: Payer: Self-pay | Admitting: Adult Health

## 2014-01-23 VITALS — BP 124/88 | Ht 64.0 in | Wt 166.5 lb

## 2014-01-23 DIAGNOSIS — N926 Irregular menstruation, unspecified: Secondary | ICD-10-CM

## 2014-01-23 DIAGNOSIS — A5901 Trichomonal vulvovaginitis: Secondary | ICD-10-CM

## 2014-01-23 DIAGNOSIS — N898 Other specified noninflammatory disorders of vagina: Secondary | ICD-10-CM

## 2014-01-23 DIAGNOSIS — E041 Nontoxic single thyroid nodule: Secondary | ICD-10-CM

## 2014-01-23 LAB — POCT WET PREP (WET MOUNT)
TRICHOMONAS WET PREP HPF POC: POSITIVE
WBC, Wet Prep HPF POC: POSITIVE

## 2014-01-23 LAB — POCT URINE PREGNANCY: Preg Test, Ur: NEGATIVE

## 2014-01-23 MED ORDER — TINIDAZOLE 500 MG PO TABS: ORAL_TABLET | ORAL | Status: DC

## 2014-01-23 NOTE — Progress Notes (Signed)
Subjective:     Patient ID: Kara Stewart, female   DOB: Feb 22, 1976, 38 y.o.   MRN: [id number]  HPI Kara Stewart is a 38 year old white female back for POC of recent trich infection,took meds.She has CT report with her for review.She still complains of discharge, and missed her period, has history of irregular menses.  Review of Systems See HPI Reviewed past medical,surgical, social and family history. Reviewed medications and allergies.     Objective:   Physical Exam BP 124/88  Ht 5\' 4"  (1.626 m)  Wt 166 lb 8 oz (75.524 kg)  BMI 28.57 kg/m2  LMP 09/10/2015UPT negative, Skin warm and dry.Pelvic: external genitalia is normal in appearance, vagina: white discharge with odor, cervix:smooth, uterus: normal size, shape and contour, non tender, no masses felt, adnexa: no masses or tenderness noted. Wet prep: + for trich and +WBCs   [facility] that shows 15 mm right thyroid nodule,I discussed with her and she needs US in follow up.She has sensation of choking at times.  Assessment:     Right thyroid nodule Vaginal discharge Trichomonas  Irregular periods    Plan:     Rx tindamax 500 mg #8 take 4 now and 4 in am with 1 refill No sex No alcohol [facility] Return in 10 days to see me for POC and review [place]

## 2014-01-23 NOTE — Patient Instructions (Signed)
Take tindamax 4 today and 4 in am US 10/26 at 8 am  No sex  POC 10/30 with me

## 2014-01-29 ENCOUNTER — Ambulatory Visit (HOSPITAL_COMMUNITY)
Admission: RE | Admit: 2014-01-29 | Discharge: 2014-01-29 | Disposition: A | Discharge status: Home / Self Care | Payer: 59 | Source: Ambulatory Visit | Attending: Adult Health | Admitting: Adult Health

## 2014-01-29 DIAGNOSIS — E041 Nontoxic single thyroid nodule: Secondary | ICD-10-CM

## 2014-01-29 DIAGNOSIS — E0520 Thyrotoxicosis with toxic multinodular goiter without thyrotoxic crisis or storm: Secondary | ICD-10-CM | POA: Diagnosis not present

## 2014-01-29 DIAGNOSIS — E039 Hypothyroidism, unspecified: Secondary | ICD-10-CM | POA: Diagnosis present

## 2014-01-30 ENCOUNTER — Telehealth: Payer: Self-pay | Admitting: Adult Health

## 2014-01-30 MED ORDER — METRONIDAZOLE 500 MG PO TABS: ORAL_TABLET | ORAL | Status: DC

## 2014-01-30 NOTE — Telephone Encounter (Signed)
Spoke with pt. Tindamax is $81.00. Can you prescribe something cheaper? She can't afford this. Thanks!! JSY

## 2014-01-30 NOTE — Telephone Encounter (Signed)
Left message that I will call flagyl in and I need to talk to her about thyroid [place]

## 2014-01-30 NOTE — Telephone Encounter (Signed)
Left message x 1. JSY 

## 2014-02-02 ENCOUNTER — Encounter: Payer: Self-pay | Admitting: Adult Health

## 2014-02-02 ENCOUNTER — Ambulatory Visit (INDEPENDENT_AMBULATORY_CARE_PROVIDER_SITE_OTHER): Payer: 59 | Admitting: Adult Health

## 2014-02-02 VITALS — BP 118/80 | Ht 64.0 in | Wt 167.0 lb

## 2014-02-02 DIAGNOSIS — E041 Nontoxic single thyroid nodule: Secondary | ICD-10-CM

## 2014-02-02 NOTE — Progress Notes (Signed)
Subjective:     Patient ID: Kara Stewart, female   DOB: 24-Aug-1975, 38 y.o.   MRN: [id number]  HPI Kara Stewart is a 38 year old white female with thyroid nodule and is having some difficulty swallowing at times,in to review US.  Review of Systems See HPi Reviewed past medical,surgical, social and family history. Reviewed medications and allergies.     Objective:   Physical Exam BP 118/80  Ht 5\' 4"  (1.626 m)  Wt 167 lb (75.751 kg)  BMI 28.65 kg/m2  LMP 01/31/2014 Reviewed US with pt.   Right thyroid lobe  Measurements: 4.4 cm x 1.6 cm x 1.4 cm. Relatively hypoechoic nodule  centered within the thyroid tissue of the lower right thyroid lobe.  This lesion measures 13 mm x 9 mm x 10 mm. Questionable  calcifications at the inferior margin. Circumferential vascularity.  Left thyroid lobe  Measurements: 4.2 cm x 1.1 cm x 1.3 cm. No nodules visualized.  Isthmus  Thickness: 4 mm. No nodules visualized.  Lymphadenopathy  None visualized.  IMPRESSION:  Right thyroid lobe nodule measuring 1.3 cm. Findings meet consensus  criteria for biopsy. Ultrasound-guided fine needle aspiration should  be considered, as per the consensus statement: Management of Thyroid  Nodules Detected at US: Society of Radiologists in Ultrasound  Consensus Conference Statement. Radiology 2005; [id number]. Will get biopsy.Pt says she wants it out, explained have to go through steps first.  Assessment:     Thyroid nodule    Plan:     [facility]   Will talk when results back

## 2014-02-02 NOTE — Patient Instructions (Signed)
[facility] Will talk when pathology back

## 2014-02-05 ENCOUNTER — Encounter: Payer: Self-pay | Admitting: Adult Health

## 2014-02-07 ENCOUNTER — Encounter (HOSPITAL_COMMUNITY): Payer: Self-pay

## 2014-02-07 ENCOUNTER — Ambulatory Visit (HOSPITAL_COMMUNITY)
Admission: RE | Admit: 2014-02-07 | Discharge: 2014-02-07 | Disposition: A | Discharge status: Home / Self Care | Payer: 59 | Source: Ambulatory Visit | Attending: Adult Health | Admitting: Adult Health

## 2014-02-07 DIAGNOSIS — E041 Nontoxic single thyroid nodule: Secondary | ICD-10-CM | POA: Diagnosis not present

## 2014-02-07 MED ORDER — LIDOCAINE HCL (PF) 2 % IJ SOLN
10.0000 mL | Freq: Once | INTRAMUSCULAR | Status: AC
  Administered 2014-02-07: 10 mL

## 2014-02-07 NOTE — Discharge Instructions (Signed)
Thyroid Biopsy °The thyroid gland is a butterfly-shaped gland situated in the front of the neck. It produces hormones which affect metabolism, growth and development, and body temperature. A thyroid biopsy is a procedure in which small samples of tissue or fluid are removed from the thyroid gland or mass and examined under a microscope. This test is done to determine the cause of thyroid problems, such as infection, cancer, or other thyroid problems. °There are 2 ways to obtain samples: °1. Fine needle biopsy. Samples are removed using a thin needle inserted through the skin and into the thyroid gland or mass. °2. Open biopsy. Samples are removed after a cut (incision) is made through the skin. °LET YOUR CAREGIVER KNOW ABOUT:  °· Allergies. °· Medications taken including herbs, eye drops, over-the-counter medications, and creams. °· Use of steroids (by mouth or creams). °· Previous problems with anesthetics or numbing medicine. °· Possibility of pregnancy, if this applies. °· History of blood clots (thrombophlebitis). °· History of bleeding or blood problems. °· Previous surgery. °· Other health problems. °RISKS AND COMPLICATIONS °· Bleeding from the site. The risk of bleeding is higher if you have a bleeding disorder or are taking any blood thinning medications (anticoagulants). °· Infection. °· Injury to structures near the thyroid gland. °BEFORE THE PROCEDURE  °This is a procedure that can be done as an outpatient. Confirm the time that you need to arrive for your procedure. Confirm whether there is a need to fast or withhold any medications. A blood sample may be done to determine your blood clotting time. Medicine may be given to help you relax (sedative). °PROCEDURE °Fine needle biopsy. °You will be awake during the procedure. You may be asked to lie on your back with your head tipped backward to extend your neck. Let your caregiver know if you cannot tolerate the positioning. An area on your neck will be  cleansed. A needle is inserted through the skin of your neck. You may feel a mild discomfort during this procedure. You may be asked to avoid coughing, talking, swallowing, or making sounds during some portions of the procedure. The needle is withdrawn once tissue or fluid samples have been removed. Pressure may be applied to the neck to reduce swelling and ensure that bleeding has stopped. The samples will be sent for examination.  °Open biopsy. °You will be given general anesthesia. You will be asleep during the procedure. An incision is made in your neck. A sample of thyroid tissue or the mass is removed. The tissue sample or mass will be sent for examination. The sample or mass may be examined during the biopsy. If the sample or mass contains cancer cells, some or all of the thyroid gland may be removed. The incision is closed with stitches. °AFTER THE PROCEDURE  °Your recovery will be assessed and monitored. If there are no problems, as an outpatient, you should be able to go home shortly after the procedure. °If you had a fine needle biopsy: °· You may have soreness at the biopsy site for 1 to 2 days. °If you had an open biopsy:  °· You may have soreness at the biopsy site for 3 to 4 days. °· You may have a hoarse voice or sore throat for 1 to 2 days. °Obtaining the Test Results °It is your responsibility to obtain your test results. Do not assume everything is normal if you have not heard from your caregiver or the medical facility. It is important for you to follow up   on all of your test results. °HOME CARE INSTRUCTIONS  °· Keeping your head raised on a pillow when you are lying down may ease biopsy site discomfort. °· Supporting the back of your head and neck with both hands as you sit up from a lying position may ease biopsy site discomfort. °· Only take over-the-counter or prescription medicines for pain, discomfort, or fever as directed by your caregiver. °· Throat lozenges or gargling with warm salt  water may help to soothe a sore throat. °SEEK IMMEDIATE MEDICAL CARE IF:  °· You have severe bleeding from the biopsy site. °· You have difficulty swallowing. °· You have a fever. °· You have increased pain, swelling, redness, or warmth at the biopsy site. °· You notice pus coming from the biopsy site. °· You have swollen glands (lymph nodes) in your neck. °Document Released: 01/18/2007 Document Revised: 07/18/2012 Document Reviewed: 06/15/2013 °ExitCare® Patient Information ©2015 ExitCare, LLC. This information is not intended to replace advice given to you by your health care provider. Make sure you discuss any questions you have with your health care provider. ° °

## 2014-02-12 ENCOUNTER — Telehealth: Payer: Self-pay | Admitting: Adult Health

## 2014-02-12 NOTE — Telephone Encounter (Signed)
Left message I called 

## 2014-02-13 ENCOUNTER — Telehealth: Payer: Self-pay | Admitting: Adult Health

## 2014-02-13 NOTE — Telephone Encounter (Signed)
Pt aware biopsy benign but still complains of difficulty with swallowing will refer to Dr Lovell SheehanJenkins to evaluate, may need EGD or stretching of espphagus vs thyroid removal.appt with Dr Lovell Sheehanjenkins 11/24 at 10 am

## 2014-02-27 ENCOUNTER — Telehealth: Payer: Self-pay | Admitting: Gastroenterology

## 2014-02-27 ENCOUNTER — Encounter: Payer: Self-pay | Admitting: Gastroenterology

## 2014-02-27 NOTE — Telephone Encounter (Signed)
APPT MADE AND L/M ON PATIENT VOICE MAIL WITH DATE AND TIME

## 2014-02-27 NOTE — Telephone Encounter (Signed)
DR, Lovell SheehanJENKINS WANTS PATIENT SEEN NEXT WEEK TO BE EVAL FOR EGD.  DIFFICULTY SWALLOWING.    [facility] [phone]  PLEASE ADVISE

## 2014-02-27 NOTE — Telephone Encounter (Signed)
DR. Lovell SheehanJENKINS ALSO WANTS PATIENT SEEN BY DR.FIELDS ONLY

## 2014-02-27 NOTE — Telephone Encounter (Signed)
Please schedule patient to see [name] on 11/30 at 11:30 urgent slot, please

## 2014-03-05 ENCOUNTER — Ambulatory Visit (INDEPENDENT_AMBULATORY_CARE_PROVIDER_SITE_OTHER): Payer: 59 | Admitting: Gastroenterology

## 2014-03-05 ENCOUNTER — Encounter: Payer: Self-pay | Admitting: Gastroenterology

## 2014-03-05 ENCOUNTER — Other Ambulatory Visit: Payer: Self-pay

## 2014-03-05 VITALS — BP 118/74 | HR 72 | Temp 98.0°F | Ht 64.0 in | Wt 169.8 lb

## 2014-03-05 DIAGNOSIS — R1310 Dysphagia, unspecified: Secondary | ICD-10-CM

## 2014-03-05 NOTE — Progress Notes (Signed)
ON RECALL LIST  °

## 2014-03-05 NOTE — Patient Instructions (Signed)
FOLLOW A SOFT MECHANICAL DIET.  MEATS SHOULD BE CHOPPED OR GROUND ONLY. VEGETABLES SHOULD BE SOFT LIKE MASHED POTATOES. SEE INFO BELOW.  UPPER ENDOSCOPY TO DILATE YOUR ESOPHAGUS ON Mar 20, 2014.  FOLLOW UP IN 4 MOS.   SOFT MECHANICAL DIET This SOFT MECHANICAL DIET is restricted to:  Foods that are moist, soft-textured, and easy to chew and swallow.   Meats that are ground or are minced no larger than one-quarter inch pieces. Meats are moist with gravy or sauce added.   Foods that do not include bread or bread-like textures except soft pancakes, well-moistened with syrup or sauce.   Textures with some chewing ability required.   Casseroles without rice.   Cooked vegetables that are less than half an inch in size and easily mashed with a fork. No cooked corn, peas, broccoli, cauliflower, cabbage, Brussels sprouts, asparagus, or other fibrous, non-tender or rubbery cooked vegetables.   Canned fruit except for pineapple. Fruit must be cut into pieces no larger than half an inch in size.   Foods that do not include nuts, seeds, coconut, or sticky textures.   FOOD TEXTURES FOR DYSPHAGIA DIET LEVEL 2 -SOFT MECHANICAL DIET (includes all foods on Dysphagia Diet Level 1 - Pureed, in addition to the foods listed below)  FOOD GROUP: Breads. RECOMMENDED: Soft pancakes, well-moistened with syrup or sauce.  AVOID: All others.  FOOD GROUP: Cereals.  RECOMMENDED: Cooked cereals with little texture, including oatmeal. Unprocessed wheat bran stirred into cereals for bulk. Note: If thin liquids are restricted, it is important that all of the liquid is absorbed into the cereal.  AVOID: All dry cereals and any cooked cereals that may contain flax seeds or other seeds or nuts. Whole-grain, dry, or coarse cereals. Cereals with nuts, seeds, dried fruit, and/or coconut.  FOOD GROUP: Desserts. RECOMMENDED: Pudding, custard. Soft fruit pies with bottom crust only. Canned fruit (excluding pineapple).  Soft, moist cakes with icing.Frozen malts, milk shakes, frozen yogurt, eggnog, nutritional supplements, ice cream, sherbet, regular or sugar-free gelatin, or any foods that become thin liquid at either room (70 F) or body temperature (98 F).  AVOID: Dry, coarse cakes and cookies. Anything with nuts, seeds, coconut, pineapple, or dried fruit. Breakfast yogurt with nuts. Rice or bread pudding.  FOOD GROUP: Fats. RECOMMENDED: Butter, margarine, cream for cereal (depending on liquid consistency recommendations), gravy, cream sauces, sour cream, sour cream dips with soft additives, mayonnaise, salad dressings, cream cheese, cream cheese spreads with soft additives, whipped toppings.  AVOID: All fats with coarse or chunky additives.  FOOD GROUP: Fruits. RECOMMENDED: Soft drained, canned, or cooked fruits without seeds or skin. Fresh soft and ripe banana. Fruit juices with a small amount of pulp. If thin liquids are restricted, fruit juices should be thickened to appropriate consistency.  AVOID: Fresh or frozen fruits. Cooked fruit with skin or seeds. Dried fruits. Fresh, canned, or cooked pineapple.  FOOD GROUP: Meats and Meat Substitutes. (Meat pieces should not exceed 1/4 of an inch cube and should be tender.) RECOMMENDED: Moistened ground or cooked meat, poultry, or fish. Moist ground or tender meat may be served with gravy or sauce. Casseroles without rice. Moist macaroni and cheese, well-cooked pasta with meat sauce, tuna noodle casserole, soft, moist lasagna. Moist meatballs, meatloaf, or fish loaf. Protein salads, such as tuna or egg without large chunks, celery, or onion. Cottage cheese, smooth quiche without large chunks. Poached, scrambled, or soft-cooked eggs (egg yolks should not be "runny" but should be moist and able to be  mashed with butter, margarine, or other moisture added to them). (Cook eggs to 160 F or use pasteurized eggs for safety.) Souffls may have small, soft chunks. Tofu.  Well-cooked, slightly mashed, moist legumes, such as baked beans. All meats or protein substitutes should be served with sauces or moistened to help maintain cohesiveness in the oral cavity.  AVOID: Dry meats, tough meats (such as bacon, sausage, hot dogs, bratwurst). Dry casseroles or casseroles with rice or large chunks. Peanut butter. Cheese slices and cubes. Hard-cooked or crisp fried eggs. Sandwiches.Pizza.  FOOD GROUP: Potatoes and Starches. RECOMMENDED: Well-cooked, moistened, boiled, baked, or mashed potatoes. Well-cooked shredded hash brown potatoes that are not crisp. (All potatoes need to be moist and in sauces.)Well-cooked noodles in sauce. Spaetzel or soft dumplings that have been moistened with butter or gravy.  AVOID: Potato skins and chips. Fried or French-fried potatoes. Rice.  FOOD GROUP: Soups. RECOMMENDED: Soups with easy-to-chew or easy-to-swallow meats or vegetables: Particle sizes in soups should be less than 1/2 inch. Soups will need to be thickened to appropriate consistency if soup is thinner than prescribed liquid consistency.  AVOID: Soups with large chunks of meat and vegetables. Soups with rice, corn, peas.  FOOD GROUP: Vegetables. RECOMMENDED: All soft, well-cooked vegetables. Vegetables should be less than a half inch. Should be easily mashed with a fork.  AVOID: Cooked corn and peas. Broccoli, cabbage, Brussels sprouts, asparagus, or other fibrous, non-tender or rubbery cooked vegetables.  FOOD GROUP: Miscellaneous. RECOMMENDED: Jams and preserves without seeds, jelly. Sauces, salsas, etc., that may have small tender chunks less than 1/2 inch. Soft, smooth chocolate bars that are easily chewed.  AVOID: Seeds, nuts, coconut, or sticky foods. Chewy candies such as caramels or licorice.

## 2014-03-05 NOTE — Assessment & Plan Note (Signed)
ASSOCIATED WITH CHOKING SENSATION IN PT WITH HISTORY OF REFLUX ESOPHAGITIS. CURRENTLY OFF A PPI.  EGD/DIL WITH MAC DEC 15. PT FAILED CONSCIOUS SEDATION IN AUG 2013. DISCUSSED PROCEDURE, BENEFITS, & RISKS: < 1% chance of medication reaction, bleeding, OR perforation. I PERSONALLY REVIEWED CT/U/S WITH DR. Tyron RussellBOLES. THYROID NODULE NOT LARGE ENOUGH TO CAUSE DYSPHAGIA. SOFT MECHANICAL DIET FOLLOW UP IN 4 MOS.

## 2014-03-05 NOTE — Telephone Encounter (Signed)
REVIEWED.  

## 2014-03-05 NOTE — Progress Notes (Signed)
cc'ed to referring

## 2014-03-05 NOTE — Progress Notes (Signed)
Subjective:    Patient ID: Kara Stewart, female    DOB: 09/10/1975, 38 y.o.   MRN: [id number]  No PCP Per Patient  HPI Feels like something is in her throat. Feels like thyroid not causing problem with swallowing. HOARSE CONSTANTLY. PROPS HEAD UP AT NIGHT. DRAINAGE GET STUCK FROM PND. WHEN SHE EATS ANYTHING SOLID, SHE CHOKES ON IT. MAY HAPPEN WITH WATER/GATORADE FAST SHE WILL CHOKE. WEIGHT DOWN: 195 LBS SEP 2015 TO 169 LBS. TRYING TO LOSE WEIGHT. LAST EGD AUG 2013-D175, V10, PHENERGAN 25 MG IV. SWEATS A LOT.DOESN'T REALLY HAVE NAUSEA AND VOMITING BUT WHEN FOOD GETS STUCK SHE STARTS GAGGING. SOB WHEN SHE GASPS. NO HEARTBURN NOW SINCE SHE CHANGED HER DIET. CUT DOWN ON HER DRINKING. HAS LOOSE STOOLS(BLOBS) EVERY TIME SHE GOES TO BATHROOM. IF DRINKS TOO MUCH MILK IT'S WATERY.   PT DENIES FEVER, CHILLS, HEMATOCHEZIA, nausea, vomiting, melena, diarrhea, CHEST PAIN, SHORTNESS OF BREATH, constipation, abdominal pain, OR heartburn or indigestion.   Past Medical History  Diagnosis Date  . SVT (supraventricular tachycardia)   . Hiatal hernia   . Anxiety   . Acid reflux   . Dyspepsia 2004    Dx w/ PUD but no EGD, [place], [place]   . Hypertriglyceridemia   . Hypothyroid   . Iritis     frequent  . Hypertension   . Asthma   . Panic attacks   . Thyroid nodule   . RLQ abdominal pain 01/11/2014  . Vaginal discharge 01/11/2014  . Trichomonal vaginitis 01/11/2014  . Weight gain 01/11/2014  . Irregular periods 01/11/2014   Past Surgical History  Procedure Laterality Date  . Esophagogastroduodenoscopy  11/06/2011    [name]: MILD Esophagitis/PATENT ESOPHAGEAL Stricture/  Moderate gastritis. Bx no.hpylori or celiac, +gastritis    Allergies  Allergen Reactions  . Penicillins Anaphylaxis   Current Outpatient Prescriptions  Medication Sig Dispense Refill  . albuterol (PROVENTIL HFA;VENTOLIN HFA) 108 (90 BASE) MCG/ACT inhaler Inhale 2 puffs into the lungs every 6 (six) hours as needed for wheezing  or shortness of breath.    Marland Kitchen. Bioflavonoid Products (ESTER C PO) Take by mouth 2 (two) times daily.    Marland Kitchen. Dextromethorphan HBr (TUSSIN COUGH PO) Take by mouth at bedtime.    . Iodine, Kelp, (KELP PO) Take by mouth. Takes 4 drops daily.    . metoprolol tartrate (LOPRESSOR) 25 MG tablet Take 25 mg by mouth daily as needed (for palpitations).    . metroNIDAZOLE (FLAGYL) 500 MG tablet Take 4 po now    . Multiple Vitamin (MULTIVITAMIN) tablet Take 1 tablet by mouth daily.    Marland Kitchen. Phenylephrine HCl (AFRIN ALLERGY NA) Place into the nose daily.    . Polyvinyl Alcohol-Povidone (REFRESH OP) Place 2 drops into both eyes as needed (dry eyes).    . sucralfate (CARAFATE) 1 G tablet Take 1 tablet (1 g total) by mouth 4 (four) times daily. (Patient not taking: Reported on 03/05/2014)     Family History  Problem Relation Age of Onset  . Stomach cancer Paternal Grandfather   . Cancer Paternal Grandfather     throat and esophagus  . Breast cancer Maternal Grandmother   . Cancer Maternal Grandmother     skin  . Anxiety disorder Maternal Grandmother   . Hypertension Mother   . Other Mother     fatty liver  . Hyperlipidemia Mother   . Other Father     varicose veins; stomach issues; hernia  . Hypertension Father   . Hyperlipidemia Father   .  Cancer Father     prostate  . Arthritis Father     rheumatoid  . Thyroid disease Sister   . Other Brother     hernia  . Diabetes Maternal Grandfather   . Other Paternal Grandmother     hernia  . COPD Paternal Grandmother     History   Social History Narrative   Lives w/ grandfather or parents or family member ([place]   Previous MD: Phill MutterAnn Lewis, NP ([place], [place])         Review of Systems PER HPI OTHERWISE ALL SYSTEMS ARE NEGATIVE.     Objective:   Physical Exam  Constitutional: She is oriented to person, place, and time. She appears well-developed and well-nourished. No distress.  HENT:  Head: Normocephalic and atraumatic.    Mouth/Throat: Oropharynx is clear and moist. No oropharyngeal exudate.  Eyes: Pupils are equal, round, and reactive to light. No scleral icterus.  Neck: Normal range of motion. Neck supple. Thyromegaly (r thyroid lobe) present.  Cardiovascular: Normal rate, regular rhythm and normal heart sounds.   Pulmonary/Chest: Effort normal and breath sounds normal. No respiratory distress.  Abdominal: Soft. Bowel sounds are normal. She exhibits no distension. There is tenderness. There is no rebound and no guarding.  MILD rLQ TTP   Musculoskeletal: She exhibits no edema.  Lymphadenopathy:    She has no cervical adenopathy.  Neurological: She is alert and oriented to person, place, and time.  NO FOCAL DEFICITS   Psychiatric:  SLIGHTLY ANXIOUS MOOD, NL AFFECT   Vitals reviewed.         Assessment & Plan:

## 2014-03-07 ENCOUNTER — Telehealth: Payer: Self-pay | Admitting: Gastroenterology

## 2014-03-07 NOTE — Telephone Encounter (Signed)
Pt was seen by SF on 11/30 and is schedule for an EGD. She calls today asking if we could call in a prescription to help her with her gagging. She isn't nauseated or vomiting, but says that she gags when she eats.  She works at [employer] and said you can reach her there or call her number [phone]. She uses [employer] also. Please advise

## 2014-03-08 NOTE — Telephone Encounter (Signed)
I told pt and she said she does not have a PCP. She went to Dr. Rayna SextonFerguson's office for some GYN and to get referred to here. She said she does not know what she will do, because she losses her breath and she has a problem for about 5 min. Said her eyes turn red and she is struggling for awhile. I told her if she could not get her breath she should call 911.

## 2014-03-08 NOTE — Telephone Encounter (Signed)
I called pt and she is waking up at night with a lot of gagging and coughing. She said she does not have heartburn, she takes Nexium qd. She is scheduled for EGD on 03/20/2014. Please advise!

## 2014-03-08 NOTE — Telephone Encounter (Signed)
PLEASE CALL PT. SHE SHOULD CONTACT HER PCP FOR MANAGEMENT OF POSTERIOR NASAL DRIP/COUGH.

## 2014-03-12 NOTE — Telephone Encounter (Signed)
REVIEWED. AGREE. NO ADDITIONAL RECOMMENDATIONS. PT NEEDS TO FIND A PCP.

## 2014-03-15 ENCOUNTER — Other Ambulatory Visit: Payer: Self-pay

## 2014-03-15 NOTE — Patient Instructions (Addendum)
Kara Stewart  03/15/2014   Your procedure is scheduled on:  03/21/15  Report to [facility] at 10:15 AM.  Call this number if you have problems the morning of surgery: [phone]   Remember:   Do not eat food or drink liquids after midnight.   Take these medicines the morning of surgery with A SIP OF WATER: metoprolol, guaifenesin   Do not wear jewelry, make-up or nail polish.  Do not wear lotions, powders, or perfumes.  Do not shave 48 hours prior to surgery. Men may shave face and neck.  Do not bring valuables to the hospital.  [facility] is not responsible for any belongings or valuables.               Contacts, dentures or bridgework may not be worn into surgery.  Leave suitcase in the car. After surgery it may be brought to your room.  For patients admitted to the hospital, discharge time is determined by your treatment team.               Patients discharged the day of surgery will not be allowed to drive home.    Please read over the following fact sheets that you were given: Anesthesia Post-op Instructions and Care and Recovery After Surgery    Esophagogastroduodenoscopy Esophagogastroduodenoscopy (EGD) is a procedure to examine the lining of the esophagus, stomach, and first part of the small intestine (duodenum). A long, flexible, lighted tube with a camera attached (endoscope) is inserted down the throat to view these organs. This procedure is done to detect problems or abnormalities, such as inflammation, bleeding, ulcers, or growths, in order to treat them. The procedure lasts about 5-20 minutes. It is usually an outpatient procedure, but it may need to be performed in emergency cases in the hospital. LET YOUR CAREGIVER KNOW ABOUT:   Allergies to food or medicine.  All medicines you are taking, including vitamins, herbs, eyedrops, and over-the-counter medicines and creams.  Use of steroids (by mouth or creams).  Previous problems you or members of  your family have had with the use of anesthetics.  Any blood disorders you have.  Previous surgeries you have had.  Other health problems you have.  Possibility of pregnancy, if this applies. RISKS AND COMPLICATIONS  Generally, EGD is a safe procedure. However, as with any procedure, complications can occur. Possible complications include:  Infection.  Bleeding.  Tearing (perforation) of the esophagus, stomach, or duodenum.  Difficulty breathing or not being able to breath.  Excessive sweating.  Spasms of the larynx.  Slowed heartbeat.  Low blood pressure. BEFORE THE PROCEDURE  Do not eat or drink anything for 6-8 hours before the procedure or as directed by your caregiver.  Ask your caregiver about changing or stopping your regular medicines.  If you wear dentures, be prepared to remove them before the procedure.  Arrange for someone to drive you home after the procedure. PROCEDURE   A vein will be accessed to give medicines and fluids. A medicine to relax you (sedative) and a pain reliever will be given through that access into the vein.  A numbing medicine (local anesthetic) may be sprayed on your throat for comfort and to stop you from gagging or coughing.  A mouth guard may be placed in your mouth to protect your teeth and to keep you from biting on the endoscope.  You will be asked to lie on your left side.  The endoscope is inserted down your throat and into  the esophagus, stomach, and duodenum.  Air is put through the endoscope to allow your caregiver to view the lining of your esophagus clearly.  The esophagus, stomach, and duodenum is then examined. During the exam, your caregiver may:  Remove tissue to be examined under a microscope (biopsy) for inflammation, infection, or other medical problems.  Remove growths.  Remove objects (foreign bodies) that are stuck.  Treat any bleeding with medicines or other devices that stop tissues from bleeding (hot  cautery, clipping devices).  Widen (dilate) or stretch narrowed areas of the esophagus and stomach.  The endoscope will then be withdrawn. AFTER THE PROCEDURE  You will be taken to a recovery area to be monitored. You will be able to go home once you are stable and alert.  Do not eat or drink anything until the local anesthetic and numbing medicines have worn off. You may choke.  It is normal to feel bloated, have pain with swallowing, or have a sore throat for a short time. This will wear off.  Your caregiver should be able to discuss his or her findings with you. It will take longer to discuss the test results if any biopsies were taken. Document Released: 07/24/2004 Document Revised: 08/07/2013 Document Reviewed: 02/24/2012 [facility] Patient Information 2015 [place], [place]. This information is not intended to replace advice given to you by your health care provider. Make sure you discuss any questions you have with your health care provider.    Esophageal Dilatation The esophagus is the long, narrow tube which carries food and liquid from the mouth to the stomach. Esophageal dilatation is the technique used to stretch a blocked or narrowed portion of the esophagus. This procedure is used when a part of the esophagus has become so narrow that it becomes difficult, painful or even impossible to swallow. This is generally an uncomplicated form of treatment. When this is not successful, chest surgery may be required. This is a much more extensive form of treatment with a longer recovery time. CAUSES  Some of the more common causes of blockage or strictures of the esophagus are:  Narrowing from longstanding inflammation (soreness and redness) of the lower esophagus. This comes from the constant exposure of the lower esophagus to the acid which bubbles up from the stomach. Over time this causes scarring and narrowing of the lower esophagus.  Hiatal hernia in which a small part of the stomach  bulges (herniates) up through the diaphragm. This can cause a gradual narrowing of the end of the esophagus.  Schatzki ring is a narrow ring of benign (non-cancerous) fibrous tissue which constricts the lower esophagus. The reason for this is not known.  Scleroderma is a connective tissue disorder that affects the esophagus and makes swallowing difficult.  Achalasia is an absence of nerves to the lower esophagus and to the esophageal sphincter. This is the circular muscle between the stomach and esophagus that relaxes to allow food into the stomach. After swallowing, it contracts to keep food in the stomach. This absence of nerves may be congenital (present since birth). This can cause irregular spasms of the lower esophageal muscle. This spasm does not open up to allow food and fluid through. The result is a persistent blockage with subsequent slow trickling of the esophageal contents into the stomach.  Strictures may develop from swallowing materials which damage the esophagus. Some examples are strong acids or alkalis such as lye.  Growths such as benign (non-cancerous) and malignant (cancerous) tumors can block the esophagus.  Hereditary (present since birth) causes. DIAGNOSIS  Your caregiver often suspects this problem by taking a medical history. They will also do a physical exam. They can then prove their suspicions using X-rays and endoscopy. Endoscopy is an exam in which a tube like a small, flexible telescope is used to look at your esophagus.  TREATMENT There are different stretching (dilating) techniques that can be used. Simple bougie dilatation may be done in the office. This usually takes only a couple minutes. A numbing (anesthetic) spray of the throat is used. Endoscopy, when done, is done in an endoscopy suite under mild sedation. When fluoroscopy is used, the procedure is performed in X-ray. Other techniques require a little longer time. Recovery is usually quick. There is no  waiting time to begin eating and drinking to test success of the treatment. Following are some of the methods used. Narrowing of the esophagus is treated by making it bigger. Commonly this is a mechanical problem which can be treated with stretching. This can be done in different ways. Your caregiver will discuss these with you. Some of the means used are:  A series of graduated (increasing thickness) flexible dilators can be used. These are weighted tubes passed through the esophagus into the stomach. The tubes used become progressively larger until the desired stretched size is reached. Graduated dilators are a simple and quick way of opening the esophagus. No visualization is required.  Another method is the use of endoscopy to place a flexible wire across the stricture. The endoscope is removed and the wire left in place. A dilator with a hole through it from end to end is guided down the esophagus and across the stricture. One or more of these dilators are passed over the wire. At the end of the exam, the wire is removed. This type of treatment may be performed in the X-ray department under fluoroscopy. An advantage of this procedure is the examiner is visualizing the end opening in the esophagus.  Stretching of the esophagus may be done using balloons. Deflated balloons are placed through the endoscope and across the stricture. This type of balloon dilatation is often done at the time of endoscopy or fluoroscopy. Flexible endoscopy allows the examiner to directly view the stricture. A balloon is inserted in the deflated form into the area of narrowing. It is then inflated with air to a certain pressure that is preset for a given circumference. When inflated, it becomes sausage shaped, stretched, and makes the stricture larger.  Achalasia requires a longer, larger balloon-type dilator. This is frequently done under X-ray control. In this situation, the spastic muscle fibers in the lower esophagus are  stretched. All of the above procedures make the passage of food and water into the stomach easier. They also make it easier for stomach contents to reflux back into the esophagus. Special medications may be used following the procedure to help prevent further stricturing. Proton-pump inhibitor medications are good at decreasing the amount of acid in the stomach juice. When stomach juice refluxes into the esophagus, the juice is no longer as acidic and is less likely to burn or scar the esophagus. RISKS AND COMPLICATIONS Esophageal dilatation is usually performed effectively and without problems. Some complications that can occur are:  A small amount of bleeding almost always happens where the stretching takes place. If this is too excessive it may require more aggressive treatment.  An uncommon complication is perforation (making a hole) of the esophagus. The esophagus is thin. It is easy  to make a hole in it. If this happens, an operation may be necessary to repair this.  A small, undetected perforation could lead to an infection in the chest. This can be very serious. HOME CARE INSTRUCTIONS   If you received sedation for your procedure, do not drive, make important decisions, or perform any activities requiring your full coordination. Do not drink alcohol, take sedatives, or use any mind altering chemicals unless instructed by your caregiver.  You may use throat lozenges or warm salt water gargles if you have throat discomfort.  You can begin eating and drinking normally on return home unless instructed otherwise. Do not purposely try to force large chunks of food down to test the benefits of your procedure.  Mild discomfort can be eased with sips of ice water.  Medications for discomfort may or may not be needed. SEEK IMMEDIATE MEDICAL CARE IF:   You begin vomiting up blood.  You develop black, tarry stools.  You develop chills or an unexplained temperature of over 101F (38.3C)  You  develop chest or abdominal pain.  You develop shortness of breath, or feel light-headed or faint.  Your swallowing is becoming more painful, difficult, or you are unable to swallow. MAKE SURE YOU:   Understand these instructions.  Will watch your condition.  Will get help right away if you are not doing well or get worse. Document Released: 05/14/2005 Document Revised: 08/07/2013 Document Reviewed: 07/01/2005 [facility] Patient Information 2015 [place]. This information is not intended to replace advice given to you by your health care provider. Make sure you discuss any questions you have with your health care provider.    PATIENT INSTRUCTIONS POST-ANESTHESIA  IMMEDIATELY FOLLOWING SURGERY:  Do not drive or operate machinery for the first twenty four hours after surgery.  Do not make any important decisions for twenty four hours after surgery or while taking narcotic pain medications or sedatives.  If you develop intractable nausea and vomiting or a severe headache please notify your doctor immediately.  FOLLOW-UP:  Please make an appointment with your surgeon as instructed. You do not need to follow up with anesthesia unless specifically instructed to do so.  WOUND CARE INSTRUCTIONS (if applicable):  Keep a dry clean dressing on the anesthesia/puncture wound site if there is drainage.  Once the wound has quit draining you may leave it open to air.  Generally you should leave the bandage intact for twenty four hours unless there is drainage.  If the epidural site drains for more than 36-48 hours please call the anesthesia department.  QUESTIONS?:  Please feel free to call your physician or the hospital operator if you have any questions, and they will be happy to assist you.

## 2014-03-16 ENCOUNTER — Encounter (HOSPITAL_COMMUNITY): Payer: Self-pay

## 2014-03-16 ENCOUNTER — Encounter (HOSPITAL_COMMUNITY)
Admission: RE | Admit: 2014-03-16 | Discharge: 2014-03-16 | Disposition: A | Discharge status: Home / Self Care | Payer: 59 | Source: Ambulatory Visit | Attending: Gastroenterology | Admitting: Gastroenterology

## 2014-03-16 DIAGNOSIS — Z01812 Encounter for preprocedural laboratory examination: Secondary | ICD-10-CM | POA: Insufficient documentation

## 2014-03-16 LAB — BASIC METABOLIC PANEL
ANION GAP: 15 (ref 5–15)
BUN: 7 mg/dL (ref 6–23)
CALCIUM: 9.2 mg/dL (ref 8.4–10.5)
CO2: 22 meq/L (ref 19–32)
CREATININE: 0.71 mg/dL (ref 0.50–1.10)
Chloride: 103 mEq/L (ref 96–112)
GFR calc Af Amer: >90 mL/min (ref >90)
GFR calc non Af Amer: >90 mL/min (ref >90)
Glucose, Bld: 77 mg/dL (ref 70–99)
Potassium: 3.9 mEq/L (ref 3.7–5.3)
SODIUM: 140 meq/L (ref 137–147)

## 2014-03-16 LAB — HCG, SERUM, QUALITATIVE: Preg, Serum: NEGATIVE

## 2014-03-16 LAB — HEMOGLOBIN AND HEMATOCRIT, BLOOD
HEMATOCRIT: 36.4 % (ref 36.0–46.0)
Hemoglobin: 12.8 g/dL (ref 12.0–15.0)

## 2014-03-16 NOTE — Pre-Procedure Instructions (Signed)
Patient given information to sign up for my chart at home. 

## 2014-03-20 ENCOUNTER — Ambulatory Visit (HOSPITAL_COMMUNITY)
Admission: RE | Admit: 2014-03-20 | Discharge: 2014-03-20 | Disposition: A | Discharge status: Home / Self Care | Payer: 59 | Source: Ambulatory Visit | Attending: Gastroenterology | Admitting: Gastroenterology

## 2014-03-20 ENCOUNTER — Telehealth: Payer: Self-pay

## 2014-03-20 ENCOUNTER — Encounter (HOSPITAL_COMMUNITY)
Admission: RE | Disposition: A | Discharge status: Home / Self Care | Payer: Self-pay | Source: Ambulatory Visit | Attending: Gastroenterology

## 2014-03-20 ENCOUNTER — Ambulatory Visit (HOSPITAL_COMMUNITY): Payer: 59 | Admitting: Anesthesiology

## 2014-03-20 ENCOUNTER — Encounter (HOSPITAL_COMMUNITY): Payer: Self-pay

## 2014-03-20 DIAGNOSIS — J45909 Unspecified asthma, uncomplicated: Secondary | ICD-10-CM | POA: Insufficient documentation

## 2014-03-20 DIAGNOSIS — Z800 Family history of malignant neoplasm of digestive organs: Secondary | ICD-10-CM | POA: Diagnosis not present

## 2014-03-20 DIAGNOSIS — R1310 Dysphagia, unspecified: Secondary | ICD-10-CM | POA: Diagnosis present

## 2014-03-20 DIAGNOSIS — K2980 Duodenitis without bleeding: Secondary | ICD-10-CM | POA: Diagnosis not present

## 2014-03-20 DIAGNOSIS — K319 Disease of stomach and duodenum, unspecified: Secondary | ICD-10-CM | POA: Diagnosis not present

## 2014-03-20 DIAGNOSIS — F419 Anxiety disorder, unspecified: Secondary | ICD-10-CM | POA: Insufficient documentation

## 2014-03-20 DIAGNOSIS — Z808 Family history of malignant neoplasm of other organs or systems: Secondary | ICD-10-CM | POA: Insufficient documentation

## 2014-03-20 DIAGNOSIS — K449 Diaphragmatic hernia without obstruction or gangrene: Secondary | ICD-10-CM | POA: Diagnosis not present

## 2014-03-20 DIAGNOSIS — K222 Esophageal obstruction: Secondary | ICD-10-CM | POA: Diagnosis not present

## 2014-03-20 DIAGNOSIS — I10 Essential (primary) hypertension: Secondary | ICD-10-CM | POA: Insufficient documentation

## 2014-03-20 DIAGNOSIS — R0789 Other chest pain: Secondary | ICD-10-CM | POA: Diagnosis not present

## 2014-03-20 DIAGNOSIS — Z803 Family history of malignant neoplasm of breast: Secondary | ICD-10-CM | POA: Diagnosis not present

## 2014-03-20 DIAGNOSIS — H209 Unspecified iridocyclitis: Secondary | ICD-10-CM | POA: Insufficient documentation

## 2014-03-20 DIAGNOSIS — I471 Supraventricular tachycardia: Secondary | ICD-10-CM | POA: Insufficient documentation

## 2014-03-20 DIAGNOSIS — E785 Hyperlipidemia, unspecified: Secondary | ICD-10-CM | POA: Insufficient documentation

## 2014-03-20 DIAGNOSIS — E039 Hypothyroidism, unspecified: Secondary | ICD-10-CM | POA: Diagnosis not present

## 2014-03-20 DIAGNOSIS — R1010 Upper abdominal pain, unspecified: Secondary | ICD-10-CM

## 2014-03-20 DIAGNOSIS — Z880 Allergy status to penicillin: Secondary | ICD-10-CM | POA: Diagnosis not present

## 2014-03-20 HISTORY — PX: ESOPHAGOGASTRODUODENOSCOPY (EGD) WITH PROPOFOL: SHX5813

## 2014-03-20 HISTORY — PX: SAVORY DILATION: SHX5439

## 2014-03-20 SURGERY — ESOPHAGOGASTRODUODENOSCOPY (EGD) WITH PROPOFOL
Anesthesia: Monitor Anesthesia Care | Site: Esophagus

## 2014-03-20 MED ORDER — PROPOFOL 10 MG/ML IV EMUL
INTRAVENOUS | Status: AC
  Filled 2014-03-20: qty 20

## 2014-03-20 MED ORDER — ONDANSETRON HCL 4 MG/2ML IJ SOLN: 4.0000 mg | Freq: Once | INTRAMUSCULAR | Status: DC | PRN

## 2014-03-20 MED ORDER — ONDANSETRON HCL 4 MG/2ML IJ SOLN
INTRAMUSCULAR | Status: AC
  Filled 2014-03-20: qty 2

## 2014-03-20 MED ORDER — PROPOFOL INFUSION 10 MG/ML OPTIME
INTRAVENOUS | Status: DC | PRN
  Administered 2014-03-20: 12:00:00 via INTRAVENOUS
  Administered 2014-03-20: 75 ug/kg/min via INTRAVENOUS

## 2014-03-20 MED ORDER — MIDAZOLAM HCL 2 MG/2ML IJ SOLN
INTRAMUSCULAR | Status: AC
  Filled 2014-03-20: qty 2

## 2014-03-20 MED ORDER — MIDAZOLAM HCL 2 MG/2ML IJ SOLN
1.0000 mg | INTRAMUSCULAR | Status: DC | PRN
  Administered 2014-03-20: 2 mg via INTRAVENOUS

## 2014-03-20 MED ORDER — LIDOCAINE HCL (PF) 1 % IJ SOLN
INTRAMUSCULAR | Status: AC
  Filled 2014-03-20: qty 5

## 2014-03-20 MED ORDER — LACTATED RINGERS IV SOLN
INTRAVENOUS | Status: DC | PRN
  Administered 2014-03-20: 11:00:00 via INTRAVENOUS

## 2014-03-20 MED ORDER — DEXLANSOPRAZOLE 60 MG PO CPDR: DELAYED_RELEASE_CAPSULE | ORAL | Status: DC

## 2014-03-20 MED ORDER — FENTANYL CITRATE 0.05 MG/ML IJ SOLN
INTRAMUSCULAR | Status: AC
  Filled 2014-03-20: qty 2

## 2014-03-20 MED ORDER — ONDANSETRON HCL 4 MG/2ML IJ SOLN
4.0000 mg | Freq: Once | INTRAMUSCULAR | Status: AC
  Administered 2014-03-20: 4 mg via INTRAVENOUS

## 2014-03-20 MED ORDER — FENTANYL CITRATE 0.05 MG/ML IJ SOLN
INTRAMUSCULAR | Status: DC | PRN
  Administered 2014-03-20 (×4): 25 ug via INTRAVENOUS

## 2014-03-20 MED ORDER — LACTATED RINGERS IV SOLN
INTRAVENOUS | Status: DC
Start: 2014-03-20 — End: 2014-03-20
  Administered 2014-03-20: 11:00:00 via INTRAVENOUS

## 2014-03-20 MED ORDER — STERILE WATER FOR IRRIGATION IR SOLN
Status: DC | PRN
  Administered 2014-03-20: 13:00:00

## 2014-03-20 MED ORDER — PROPOFOL 10 MG/ML IV BOLUS
INTRAVENOUS | Status: AC
  Filled 2014-03-20: qty 20

## 2014-03-20 MED ORDER — LIDOCAINE HCL (CARDIAC) 10 MG/ML IV SOLN
INTRAVENOUS | Status: DC | PRN
  Administered 2014-03-20: 50 mg via INTRAVENOUS

## 2014-03-20 MED ORDER — LIDOCAINE VISCOUS 2 % MT SOLN
3.0000 mL | Freq: Once | OROMUCOSAL | Status: AC
  Administered 2014-03-20: 3 mL via OROMUCOSAL
  Filled 2014-03-20: qty 5

## 2014-03-20 MED ORDER — MIDAZOLAM HCL 5 MG/5ML IJ SOLN
INTRAMUSCULAR | Status: DC | PRN
  Administered 2014-03-20: 2 mg via INTRAVENOUS
  Administered 2014-03-20 (×2): 1 mg via INTRAVENOUS

## 2014-03-20 MED ORDER — LIDOCAINE VISCOUS 2 % MT SOLN
OROMUCOSAL | Status: AC
  Filled 2014-03-20: qty 15

## 2014-03-20 MED ORDER — FENTANYL CITRATE 0.05 MG/ML IJ SOLN
25.0000 ug | INTRAMUSCULAR | Status: AC
  Administered 2014-03-20 (×2): 25 ug via INTRAVENOUS

## 2014-03-20 MED ORDER — FENTANYL CITRATE 0.05 MG/ML IJ SOLN: 25.0000 ug | INTRAMUSCULAR | Status: DC | PRN

## 2014-03-20 SURGICAL SUPPLY — 21 items
BLOCK BITE 60FR ADLT L/F BLUE (MISCELLANEOUS) ×3 IMPLANT
ELECT REM PT RETURN 9FT ADLT (ELECTROSURGICAL)
ELECTRODE REM PT RTRN 9FT ADLT (ELECTROSURGICAL) IMPLANT
FLOOR PAD 36X40 (MISCELLANEOUS) ×3
FORCEPS BIOP RAD 4 LRG CAP 4 (CUTTING FORCEPS) IMPLANT
FORMALIN 10 PREFIL 20ML (MISCELLANEOUS) IMPLANT
KIT CLEAN ENDO COMPLIANCE (KITS) ×2 IMPLANT
MANIFOLD NEPTUNE II (INSTRUMENTS) ×3 IMPLANT
NDL SCLEROTHERAPY 25GX240 (NEEDLE) IMPLANT
NEEDLE SCLEROTHERAPY 25GX240 (NEEDLE) IMPLANT
PAD FLOOR 36X40 (MISCELLANEOUS) ×2 IMPLANT
PROBE APC STR FIRE (PROBE) IMPLANT
PROBE INJECTION GOLD (MISCELLANEOUS)
PROBE INJECTION GOLD 7FR (MISCELLANEOUS) IMPLANT
SNARE ROTATE MED OVAL 20MM (MISCELLANEOUS) IMPLANT
SNARE SHORT THROW 13M SML OVAL (MISCELLANEOUS) IMPLANT
SYR 50ML LL SCALE MARK (SYRINGE) ×2 IMPLANT
SYR INFLATION 60ML (SYRINGE) ×2 IMPLANT
TUBING ENDO SMARTCAP PENTAX (MISCELLANEOUS) ×3 IMPLANT
TUBING IRRIGATION ENDOGATOR (MISCELLANEOUS) ×3 IMPLANT
WATER STERILE IRR 1000ML POUR (IV SOLUTION) ×2 IMPLANT

## 2014-03-20 NOTE — Progress Notes (Signed)
REVIEWED.  

## 2014-03-20 NOTE — Telephone Encounter (Signed)
Pt came by the office requesting samples of dexilant. Gave #2 boxes of dexilant and a savings card.

## 2014-03-20 NOTE — Anesthesia Procedure Notes (Signed)
Procedure Name: MAC Date/Time: 03/20/2014 11:50 AM Performed by: Pernell DupreADAMS, Harvy Riera A Pre-anesthesia Checklist: Patient identified, Timeout performed, Emergency Drugs available, Suction available and Patient being monitored Oxygen Delivery Method: Simple face mask

## 2014-03-20 NOTE — Interval H&P Note (Signed)
History and Physical Interval Note:  03/20/2014 11:05 AM  [name] Solarz-Winchester  has presented today for surgery, with the diagnosis of dysphagia  The various methods of treatment have been discussed with the patient and family. After consideration of risks, benefits and other options for treatment, the patient has consented to  Procedure(s) with comments: ESOPHAGOGASTRODUODENOSCOPY (EGD) WITH PROPOFOL (N/A) - 1115 SAVORY DILATION (N/A) MALONEY DILATION (N/A) as a surgical intervention .  The patient's history has been reviewed, patient examined, no change in status, stable for surgery.  I have reviewed the patient's chart and labs.  Questions were answered to the patient's satisfaction.     Eaton CorporationSandi Raizy Auzenne

## 2014-03-20 NOTE — Anesthesia Postprocedure Evaluation (Signed)
  Anesthesia Post-op Note  Patient: Kara CornfieldStephanie Christians-Winchester  Procedure(s) Performed: Procedure(s) with comments: ESOPHAGOGASTRODUODENOSCOPY (EGD) WITH PROPOFOL (N/A) SAVORY DILATION (N/A) - dilated with # 12.8, 14,15,16  Patient Location: PACU  Anesthesia Type:MAC  Level of Consciousness: awake, alert , oriented and patient cooperative  Airway and Oxygen Therapy: Patient Spontanous Breathing  Post-op Pain: none  Post-op Assessment: Post-op Vital signs reviewed, Patient's Cardiovascular Status Stable, Respiratory Function Stable, Patent Airway, No signs of Nausea or vomiting and Pain level controlled  Post-op Vital Signs: Reviewed and stable  Last Vitals:  Filed Vitals:   03/20/14 1245  BP: 107/57  Pulse: 84  Temp:   Resp: 20    Complications: No apparent anesthesia complications

## 2014-03-20 NOTE — Transfer of Care (Signed)
Immediate Anesthesia Transfer of Care Note  Patient: Kara Stewart  Procedure(s) Performed: Procedure(s): ESOPHAGOGASTRODUODENOSCOPY (EGD) WITH PROPOFOL (N/A) SAVORY DILATION (N/A)  Patient Location: PACU  Anesthesia Type:MAC  Level of Consciousness: sedated and patient cooperative  Airway & Oxygen Therapy: Patient Spontanous Breathing and Patient connected to nasal cannula oxygen  Post-op Assessment: Report given to PACU RN and Post -op Vital signs reviewed and stable  Post vital signs: Reviewed and stable  Complications: No apparent anesthesia complications

## 2014-03-20 NOTE — Telephone Encounter (Signed)
Ethelene Brownsnthony from [facility] called to let us know that she needs a PA for her EGD. I called and talked with Gearldine BienenstockBrandy and started one. The reference number is [id number] the case is pending.

## 2014-03-20 NOTE — Discharge Instructions (Signed)
I dilated your esophagus. You have a stricture near the base of your esophagus.  You have gastritis. I biopsied your stomach AND ESOPHAGUS.   USE CHLORASEPTIC SPRAY OR LOZENGES AS NEEDED FOR A SORE THROAT. DO NOT EAT SPICY FOODS. IT WILL MAKE THE PAIN WORSE.   RE-START DEXILANT  FOLLOW A FULL LIQUID OR LOW FAT SOFT MECHANICAL DIET.  MEATS SHOULD BE CHOPPED OR GROUND ONLY. VEGETABLES SHOULD BE SOFT LIKE MASHED POTATOES. SEE INFO BELOW ON LOW FAT DIET.  YOUR BIOPSY WILL BE BACK IN 14 DAYS OR YOU CAN LOOK THEM UP ON MY CHART AFTER DEC 17..   FOLLOW UP IN APR 2016.  UPPER ENDOSCOPY AFTER CARE Read the instructions outlined below and refer to this sheet in the next week. These discharge instructions provide you with general information on caring for yourself after you leave the hospital. While your treatment has been planned according to the most current medical practices available, unavoidable complications occasionally occur. If you have any problems or questions after discharge, call DR. Raymonda Pell, [phone].  ACTIVITY  You may resume your regular activity, but move at a slower pace for the next 24 hours.   Take frequent rest periods for the next 24 hours.   Walking will help get rid of the air and reduce the bloated feeling in your belly (abdomen).   No driving for 24 hours (because of the medicine (anesthesia) used during the test).   You may shower.   Do not sign any important legal documents or operate any machinery for 24 hours (because of the anesthesia used during the test).    NUTRITION  Drink plenty of fluids.   You may resume your normal diet as instructed by your doctor.   Begin with a light meal and progress to your normal diet. Heavy or fried foods are harder to digest and may make you feel sick to your stomach (nauseated).   Avoid alcoholic beverages for 24 hours or as instructed.    MEDICATIONS  You may resume your normal medications.   WHAT YOU CAN  EXPECT TODAY  Some feelings of bloating in the abdomen.   Passage of more gas than usual.    IF YOU HAD A BIOPSY TAKEN DURING THE UPPER ENDOSCOPY:  Eat a soft diet IF YOU HAVE NAUSEA, BLOATING, ABDOMINAL PAIN, OR VOMITING.    FINDING OUT THE RESULTS OF YOUR TEST Not all test results are available during your visit. DR. Darrick PennaFIELDS WILL CALL YOU WITHIN 7 DAYS OF YOUR PROCEDUE WITH YOUR RESULTS. Do not assume everything is normal if you have not heard from DR. Kelvin Burpee IN ONE WEEK, CALL HER OFFICE AT [phone].  SEEK IMMEDIATE MEDICAL ATTENTION AND CALL THE OFFICE: [phone] IF:  You have more than a spotting of blood in your stool.   Your belly is swollen (abdominal distention).   You are nauseated or vomiting.   You have a temperature over 101F.   You have abdominal pain or discomfort that is severe or gets worse throughout the day.  Gastritis  Gastritis is an inflammation (the body's way of reacting to injury and/or infection) of the stomach. It is often caused by viral or bacterial (germ) infections. It can also be caused BY ASPIRIN, BC/GOODY POWDER'S, (IBUPROFEN) MOTRIN, OR ALEVE (NAPROXEN), chemicals (including alcohol), SPICY FOODS, and medications. This illness may be associated with generalized malaise (feeling tired, not well), UPPER ABDOMINAL STOMACH cramps, and fever. One common bacterial cause of gastritis is an organism known as H. Pylori.  This can be treated with antibiotics.   ESOPHAGEAL STRICTURE  Esophageal strictures can be caused by stomach acid backing up into the tube that carries food from the mouth down to the stomach (lower esophagus).  TREATMENT There are a number of medicines used to treat reflux/stricture, including: Antacids.  Proton-pump inhibitors: DEXILANT  HOME CARE INSTRUCTIONS Eat 2-3 hours before going to bed.  Try to reach and maintain a healthy weight.  Do not eat just a few very large meals. Instead, eat 4 TO 6 smaller meals throughout the  day.  Try to identify foods and beverages that make your symptoms worse, and avoid these.  Avoid tight clothing.  Do not exercise right after eating.  Low-Fat Diet BREADS, CEREALS, PASTA, RICE, DRIED PEAS, AND BEANS These products are high in carbohydrates and most are low in fat. Therefore, they can be increased in the diet as substitutes for fatty foods. They too, however, contain calories and should not be eaten in excess. Cereals can be eaten for snacks as well as for breakfast.   FRUITS AND VEGETABLES It is good to eat fruits and vegetables. Besides being sources of fiber, both are rich in vitamins and some minerals. They help you get the daily allowances of these nutrients. Fruits and vegetables can be used for snacks and desserts.  MEATS Limit lean meat, chicken, [place]key, and fish to no more than 6 ounces per day. Beef, Pork, and Lamb Use lean cuts of beef, pork, and lamb. Lean cuts include:  Extra-lean ground beef.  Arm roast.  Sirloin tip.  Center-cut ham.  Round steak.  Loin chops.  Rump roast.  Tenderloin.  Trim all fat off the outside of meats before cooking. It is not necessary to severely decrease the intake of red meat, but lean choices should be made. Lean meat is rich in protein and contains a highly absorbable form of iron. Premenopausal women, in particular, should avoid reducing lean red meat because this could increase the risk for low red blood cells (iron-deficiency anemia).  Chicken and [place]key These are good sources of protein. The fat of poultry can be reduced by removing the skin and underlying fat layers before cooking. Chicken and [place]key can be substituted for lean red meat in the diet. Poultry should not be fried or covered with high-fat sauces. Fish and Shellfish Fish is a good source of protein. Shellfish contain cholesterol, but they usually are low in saturated fatty acids. The preparation of fish is important. Like chicken and [place], they should not be  fried or covered with high-fat sauces. EGGS Egg whites contain no fat or cholesterol. They can be eaten often. Try 1 to 2 egg whites instead of whole eggs in recipes or use egg substitutes that do not contain yolk. MILK AND DAIRY PRODUCTS Use skim or 1% milk instead of 2% or whole milk. Decrease whole milk, natural, and processed cheeses. Use nonfat or low-fat (2%) cottage cheese or low-fat cheeses made from vegetable oils. Choose nonfat or low-fat (1 to 2%) yogurt. Experiment with evaporated skim milk in recipes that call for heavy cream. Substitute low-fat yogurt or low-fat cottage cheese for sour cream in dips and salad dressings. Have at least 2 servings of low-fat dairy products, such as 2 glasses of skim (or 1%) milk each day to help get your daily calcium intake. FATS AND OILS Reduce the total intake of fats, especially saturated fat. Butterfat, lard, and beef fats are high in saturated fat and cholesterol. These should be  avoided as much as possible. Vegetable fats do not contain cholesterol, but certain vegetable fats, such as coconut oil, palm oil, and palm kernel oil are very high in saturated fats. These should be limited. These fats are often used in bakery goods, processed foods, popcorn, oils, and nondairy creamers. Vegetable shortenings and some peanut butters contain hydrogenated oils, which are also saturated fats. Read the labels on these foods and check for saturated vegetable oils. Unsaturated vegetable oils and fats do not raise blood cholesterol. However, they should be limited because they are fats and are high in calories. Total fat should still be limited to 30% of your daily caloric intake. Desirable liquid vegetable oils are corn oil, cottonseed oil, olive oil, canola oil, safflower oil, soybean oil, and sunflower oil. Peanut oil is not as good, but small amounts are acceptable. Buy a heart-healthy tub margarine that has no partially hydrogenated oils in the ingredients. Mayonnaise  and salad dressings often are made from unsaturated fats, but they should also be limited because of their high calorie and fat content. Seeds, nuts, peanut butter, olives, and avocados are high in fat, but the fat is mainly the unsaturated type. These foods should be limited mainly to avoid excess calories and fat. OTHER EATING TIPS Snacks  Most sweets should be limited as snacks. They tend to be rich in calories and fats, and their caloric content outweighs their nutritional value. Some good choices in snacks are graham crackers, melba toast, soda crackers, bagels (no egg), English muffins, fruits, and vegetables. These snacks are preferable to snack crackers, [place] fries, TORTILLA CHIPS, and POTATO chips. Popcorn should be air-popped or cooked in small amounts of liquid vegetable oil. Desserts Eat fruit, low-fat yogurt, and fruit ices instead of pastries, cake, and cookies. Sherbet, angel food cake, gelatin dessert, frozen low-fat yogurt, or other frozen products that do not contain saturated fat (pure fruit juice bars, frozen ice pops) are also acceptable.  COOKING METHODS Choose those methods that use little or no fat. They include: Poaching.  Braising.  Steaming.  Grilling.  Baking.  Stir-frying.  Broiling.  Microwaving.  Foods can be cooked in a nonstick pan without added fat, or use a nonfat cooking spray in regular cookware. Limit fried foods and avoid frying in saturated fat. Add moisture to lean meats by using water, broth, cooking wines, and other nonfat or low-fat sauces along with the cooking methods mentioned above. Soups and stews should be chilled after cooking. The fat that forms on top after a few hours in the refrigerator should be skimmed off. When preparing meals, avoid using excess salt. Salt can contribute to raising blood pressure in some people.  EATING AWAY FROM HOME Order entres, potatoes, and vegetables without sauces or butter. When meat exceeds the size of a deck  of cards (3 to 4 ounces), the rest can be taken home for another meal. Choose vegetable or fruit salads and ask for low-calorie salad dressings to be served on the side. Use dressings sparingly. Limit high-fat toppings, such as bacon, crumbled eggs, cheese, sunflower seeds, and olives. Ask for heart-healthy tub margarine instead of butter.

## 2014-03-20 NOTE — H&P (View-Only) (Signed)
 Subjective:    Patient ID: Kara Stewart, female    DOB: 03/20/1976, 37 y.o.   MRN: [id number]  No PCP Per Patient  HPI Feels like something is in her throat. Feels like thyroid not causing problem with swallowing. HOARSE CONSTANTLY. PROPS HEAD UP AT NIGHT. DRAINAGE GET STUCK FROM PND. WHEN SHE EATS ANYTHING SOLID, SHE CHOKES ON IT. MAY HAPPEN WITH WATER/GATORADE FAST SHE WILL CHOKE. WEIGHT DOWN: 195 LBS SEP 2015 TO 169 LBS. TRYING TO LOSE WEIGHT. LAST EGD AUG 2013-D175, V10, PHENERGAN 25 MG IV. SWEATS A LOT.DOESN'T REALLY HAVE NAUSEA AND VOMITING BUT WHEN FOOD GETS STUCK SHE STARTS GAGGING. SOB WHEN SHE GASPS. NO HEARTBURN NOW SINCE SHE CHANGED HER DIET. CUT DOWN ON HER DRINKING. HAS LOOSE STOOLS(BLOBS) EVERY TIME SHE GOES TO BATHROOM. IF DRINKS TOO MUCH MILK IT'S WATERY.   PT DENIES FEVER, CHILLS, HEMATOCHEZIA, nausea, vomiting, melena, diarrhea, CHEST PAIN, SHORTNESS OF BREATH, constipation, abdominal pain, OR heartburn or indigestion.   Past Medical History  Diagnosis Date  . SVT (supraventricular tachycardia)   . Hiatal hernia   . Anxiety   . Acid reflux   . Dyspepsia 2004    Dx w/ PUD but no EGD, [place], [place]   . Hypertriglyceridemia   . Hypothyroid   . Iritis     frequent  . Hypertension   . Asthma   . Panic attacks   . Thyroid nodule   . RLQ abdominal pain 01/11/2014  . Vaginal discharge 01/11/2014  . Trichomonal vaginitis 01/11/2014  . Weight gain 01/11/2014  . Irregular periods 01/11/2014   Past Surgical History  Procedure Laterality Date  . Esophagogastroduodenoscopy  11/06/2011    [name]: MILD Esophagitis/PATENT ESOPHAGEAL Stricture/  Moderate gastritis. Bx no.hpylori or celiac, +gastritis    Allergies  Allergen Reactions  . Penicillins Anaphylaxis   Current Outpatient Prescriptions  Medication Sig Dispense Refill  . albuterol (PROVENTIL HFA;VENTOLIN HFA) 108 (90 BASE) MCG/ACT inhaler Inhale 2 puffs into the lungs every 6 (six) hours as needed for wheezing  or shortness of breath.    . Bioflavonoid Products (ESTER C PO) Take by mouth 2 (two) times daily.    . Dextromethorphan HBr (TUSSIN COUGH PO) Take by mouth at bedtime.    . Iodine, Kelp, (KELP PO) Take by mouth. Takes 4 drops daily.    . metoprolol tartrate (LOPRESSOR) 25 MG tablet Take 25 mg by mouth daily as needed (for palpitations).    . metroNIDAZOLE (FLAGYL) 500 MG tablet Take 4 po now    . Multiple Vitamin (MULTIVITAMIN) tablet Take 1 tablet by mouth daily.    . Phenylephrine HCl (AFRIN ALLERGY NA) Place into the nose daily.    . Polyvinyl Alcohol-Povidone (REFRESH OP) Place 2 drops into both eyes as needed (dry eyes).    . sucralfate (CARAFATE) 1 G tablet Take 1 tablet (1 g total) by mouth 4 (four) times daily. (Patient not taking: Reported on 03/05/2014)     Family History  Problem Relation Age of Onset  . Stomach cancer Paternal Grandfather   . Cancer Paternal Grandfather     throat and esophagus  . Breast cancer Maternal Grandmother   . Cancer Maternal Grandmother     skin  . Anxiety disorder Maternal Grandmother   . Hypertension Mother   . Other Mother     fatty liver  . Hyperlipidemia Mother   . Other Father     varicose veins; stomach issues; hernia  . Hypertension Father   . Hyperlipidemia Father   .   Cancer Father     prostate  . Arthritis Father     rheumatoid  . Thyroid disease Sister   . Other Brother     hernia  . Diabetes Maternal Grandfather   . Other Paternal Grandmother     hernia  . COPD Paternal Grandmother     History   Social History Narrative   Lives w/ grandfather or parents or family member ([place]oved from [place]   Previous MD: Phill MutterAnn Lewis, NP ([place], [place])         Review of Systems PER HPI OTHERWISE ALL SYSTEMS ARE NEGATIVE.     Objective:   Physical Exam  Constitutional: She is oriented to person, place, and time. She appears well-developed and well-nourished. No distress.  HENT:  Head: Normocephalic and atraumatic.    Mouth/Throat: Oropharynx is clear and moist. No oropharyngeal exudate.  Eyes: Pupils are equal, round, and reactive to light. No scleral icterus.  Neck: Normal range of motion. Neck supple. Thyromegaly (r thyroid lobe) present.  Cardiovascular: Normal rate, regular rhythm and normal heart sounds.   Pulmonary/Chest: Effort normal and breath sounds normal. No respiratory distress.  Abdominal: Soft. Bowel sounds are normal. She exhibits no distension. There is tenderness. There is no rebound and no guarding.  MILD rLQ TTP   Musculoskeletal: She exhibits no edema.  Lymphadenopathy:    She has no cervical adenopathy.  Neurological: She is alert and oriented to person, place, and time.  NO FOCAL DEFICITS   Psychiatric:  SLIGHTLY ANXIOUS MOOD, NL AFFECT   Vitals reviewed.         Assessment & Plan:

## 2014-03-20 NOTE — Anesthesia Preprocedure Evaluation (Signed)
Anesthesia Evaluation  Patient identified by MRN, date of birth, ID band Patient awake    Reviewed: Allergy & Precautions, H&P , NPO status , Patient's Chart, lab work & pertinent test results  Airway Mallampati: II  TM Distance: >3 FB     Dental  (+) Teeth Intact   Pulmonary asthma ,    breath sounds clear to auscultation       Cardiovascular hypertension, Pt. on medications  Rhythm:Regular Rate:Normal     Neuro/Psych PSYCHIATRIC DISORDERS Anxiety    GI/Hepatic hiatal hernia, GERD  ,(+)     substance abuse  alcohol use, Hepatitis -  Endo/Other  Hypothyroidism   Renal/GU      Musculoskeletal   Abdominal   Peds  Hematology   Anesthesia Other Findings   Reproductive/Obstetrics                             Anesthesia Physical Anesthesia Plan  ASA: III  Anesthesia Plan: MAC   Post-op Pain Management:    Induction: Intravenous  Airway Management Planned: Simple Face Mask  Additional Equipment:   Intra-op Plan:   Post-operative Plan:   Informed Consent: I have reviewed the patients History and Physical, chart, labs and discussed the procedure including the risks, benefits and alternatives for the proposed anesthesia with the patient or authorized representative who has indicated his/her understanding and acceptance.     Plan Discussed with:   Anesthesia Plan Comments:         Anesthesia Quick Evaluation  

## 2014-03-21 ENCOUNTER — Telehealth: Payer: Self-pay | Admitting: Gastroenterology

## 2014-03-21 ENCOUNTER — Encounter (HOSPITAL_COMMUNITY): Payer: Self-pay | Admitting: Gastroenterology

## 2014-03-21 MED ORDER — LIDOCAINE VISCOUS 2 % MT SOLN: OROMUCOSAL | Status: DC

## 2014-03-21 NOTE — Telephone Encounter (Signed)
I spoke with the pt- she is having a sharp pain on her R side, under her breast, that comes and goes about every 7-10 minutes. She is on a full liquid/soft diet, she has only eaten soup yesterday and it hurts worse when she eats.  No vomiting, no fever. She said it still feels like there is something in her throat that hurts when she burps. She is taking dexilant. Pt had diarrhea x 3 last night.  I advised pt to stay on a liquid diet until she hears back from me with any recommendations from [name].

## 2014-03-21 NOTE — Op Note (Signed)
[facility] [address] [place], [id number]   ENDOSCOPY PROCEDURE REPORT  PATIENT: Kara EstersLoye-winchester, Kara  MR#: #[id number] BIRTHDATE: [birth year] , 37  yrs. old GENDER: female  ENDOSCOPIST: Jonette EvaSandi Lylla Eifler, MD REFFERED BY:  PROCEDURE DATE:  03/20/2014 PROCEDURE:   EGD with dilatation over guidewire and EGD with biopsy  INDICATIONS:1.  dysphagia.   2.  chest pain. MEDICATIONS: Monitored anesthesia care TOPICAL ANESTHETIC: Viscous Xylocaine  DESCRIPTION OF PROCEDURE:   After the risks benefits and alternatives of the procedure were thoroughly explained, informed consent was obtained.  The     endoscope was introduced through the mouth and advanced to the second portion of the duodenum. The instrument was slowly withdrawn as the mucosa was carefully examined.  Prior to withdrawal of the scope, the guidwire was placed.  The esophagus was dilated successfully.  The patient was recovered in endoscopy and discharged home in satisfactory condition.   ESOPHAGUS: Furrowing in esophagus.  Distal esophageal stricture. COLD BIOPSIES OBTAINED 15 & 30 CM FROM THE TEETH.  GE JXN 35 CM FROM THE TEETH.   STOMACH: A hiatal hernia was found.   Mild non-erosive gastritis (inflammation) was found in the gastric antrum.  Multiple biopsies were performed using cold forceps. DUODENUM: Mild duodenal inflammation was found in the duodenal bulb.   The duodenal mucosa showed no abnormalities in the bulb and second portion of the duodenum.   Dilation was then performed at the gastroesphageal junction Dilator: Savary over guidewire Size(s): 12.8-16 MM Heme: none  COMPLICATIONS: There were no immediate complications.  ENDOSCOPIC IMPRESSION: 1.   MILD ESOPHAGITIS &  Distal esophageal stricture 2.   SMALL Hiatal hernia 3.   MODERATE Non-erosive gastritis AND MILD DUODENITIS  RECOMMENDATIONS: USE CHLORASEPTIC SPRAY OR LOZENGES AS NEEDED FOR A SORE THROAT.  DO NOT EAT SPICY  FOODS. RE-START DEXILANT FOLLOW A FULL LIQUID OR LOW FAT SOFT MECHANICAL DIET.  MEATS SHOULD BE CHOPPED OR GROUND ONLY.  VEGETABLES SHOULD BE SOFT LIKE MASHED POTATOES. AWAIT BIOPSY FOLLOW UP IN APR 2016. _______________________________ eSignedJonette Eva:  Flower Franko, MD 03/21/2014 4:51 PM   CPT CODES: ICD CODES:  The ICD and CPT codes recommended by this software are interpretations from the data that the clinical staff has captured with the software.  The verification of the translation of this report to the ICD and CPT codes and modifiers is the sole responsibility of the health care institution and practicing physician where this report was generated.  [employer] will not be held responsible for the validity of the ICD and CPT codes included on this report.  AMA assumes no liability for data contained or not contained herein. CPT is a Publishing rights managerregistered trademark of the [employer].

## 2014-03-21 NOTE — Telephone Encounter (Signed)
PLEASE CALL PT. SHE SHOULD REMAIN ON SOFT MECHANICAL OR LIQUID DIET.  AVOID SPICY FOODS.SHE MAY ADD VISCOUS LIDOCAINE 2 TSP QAC AND HS FOR THE NEXT 3 DAYS. RX SENT. HER SYMPTOMS SHOULD IMPROVE IN THE NEXT 3 DAYSS

## 2014-03-21 NOTE — Telephone Encounter (Signed)
Pt is aware. [facility], [facility] and [facility].

## 2014-03-21 NOTE — Telephone Encounter (Signed)
Patient had an EGD done yesterday. [facility]. She is having sharp pain on her right side and feels like trapped air is in her throat. She said she hasn't gotten any relief and nothing has changed. She is aware that DS is not here this week, but I would let the other nurses know of her concerns. Please advise [phone]

## 2014-03-21 NOTE — Addendum Note (Signed)
Addended by: West BaliFIELDS, Josslynn Mentzer L on: 03/21/2014 03:55 PM   Modules accepted: Orders

## 2014-03-22 ENCOUNTER — Telehealth: Payer: Self-pay | Admitting: Gastroenterology

## 2014-03-22 NOTE — Telephone Encounter (Signed)
Her EGD was approved the PA number is [id number]. I called the pre cert center to let them know and talked with Ethelene BrownsAnthony.

## 2014-03-22 NOTE — Telephone Encounter (Signed)
Pt is calling asking if she can take her lidocaine prescription with or between meals. Please advise.She is at work [employer]([facility]) call her at [phone]

## 2014-03-26 NOTE — Telephone Encounter (Signed)
Pt calling back to see about the medication to see if she dan take it in between meals. She is also wanting to know about the bx. She is still having the diarrhea. Pain in her right side under her breast. Please advise

## 2014-03-27 NOTE — Telephone Encounter (Signed)
LMOM for pt to call back regarding path results

## 2014-03-27 NOTE — Telephone Encounter (Signed)
Patient scheduled for April.

## 2014-03-27 NOTE — Telephone Encounter (Signed)
Please call pt. HER ESOPHAGEAL BIOPSIES SHOW SHE HAS REFLUX. HER stomach Bx shows gastritis. HER LOOSE STOOLS MAY BE LACTOSE INTOLERANCE.   ADD A PROBIOTIC DAILY FOR 4 MOS. USE LIDOCAINE 30 MINS PRIOR TO MEALS AND AT BEDTIME.  USE CHLORASEPTIC SPRAY OR LOZENGES AS NEEDED FOR A SORE THROAT.   CONITNUE DEXILANT  FOLLOW A DAIRY FREE/LOW FAT DIET FOR 4 MOS. GO TO [email or web address] FOR DIET HANDOUTS. MEATS SHOULD BE CHOPPED OR GROUND ONLY. VEGETABLES SHOULD BE SOFT LIKE MASHED POTATOES.   FOLLOW UP IN APR 2016 E30 LOOSE STOOLS/DYSPHAGIA/DYSPEPSIA.

## 2014-03-27 NOTE — Telephone Encounter (Signed)
Pt called back and is aware of instructions and of path results

## 2014-04-02 ENCOUNTER — Other Ambulatory Visit: Payer: Self-pay | Admitting: Gastroenterology

## 2014-04-03 ENCOUNTER — Telehealth: Payer: Self-pay

## 2014-04-03 NOTE — Telephone Encounter (Signed)
Santina EvansCatherine from the pharmacy called back and said that when the order was put in, someone failed to put in the refills and so they have taken care of that and will refill for pt.   Sending FYI to refill box.

## 2014-04-03 NOTE — Telephone Encounter (Signed)
I called the pharmacy at [facility] and spoke to the pharmacist. She said the pt needs the Xylocaine that was last filled on 03/21/2014. ( Looks like the [facility], but it must have been transferred). I asked the pharmacist was the pt using it correctly and she wanted time to figure it out and give me a call back.

## 2014-04-03 NOTE — Telephone Encounter (Signed)
PATIENT PHARMACY SENT US A REFILL FOR HER YESTERDAY AND SHE CALLED STATING THAT THEY HAVE NOT GOTTEN IT BACK AND SHE CAN NOT EAT WITHOUT IT AND "IS GOING TO PASS OUT FROM [facility]" BECAUSE SHE HAS NOT BEEN ABLE TO EAT.  PLEASE ADVISE

## 2014-04-12 ENCOUNTER — Telehealth: Payer: Self-pay | Admitting: Gastroenterology

## 2014-04-12 DIAGNOSIS — R197 Diarrhea, unspecified: Secondary | ICD-10-CM

## 2014-04-12 NOTE — Telephone Encounter (Signed)
LMOM to call.

## 2014-04-12 NOTE — Telephone Encounter (Signed)
PATIENT CALLED STATING SHE IS STILL HAVING DIARRHEA DESPITE STOPPING ALL DAIRY PRODUCTS.  IS ALSO RUNNING A FEVER   PLEASE CALL

## 2014-04-12 NOTE — Telephone Encounter (Signed)
PATIENT SHAVING DIARRHEA AND RUNNING A FEVER.  STATES SHE HAS COMPLETELY STOPPED ALL DAIRY PRODUCTS.  PLEASE CALL [phone]

## 2014-04-13 MED ORDER — DICYCLOMINE HCL 10 MG PO CAPS: 10.0000 mg | ORAL_CAPSULE | Freq: Three times a day (TID) | ORAL | Status: DC

## 2014-04-13 NOTE — Telephone Encounter (Signed)
I called and informed pt to do the labs/ stools. Orders faxed to [facility] and she will get containiers from them. She said she is drinking some wine. She has a couple of glasses a couple of times a week. She is aware the Bentyl has been sent to the pharmacy.

## 2014-04-13 NOTE — Telephone Encounter (Signed)
Reviewed records. Consider [name] input once available but for now see below:  Make sure she is not drinking etoh. Stool for C.diff, culture, giardia/crypto. TTG, IgA. Bentyl 10mg  qac/qhs. RX sent.

## 2014-04-13 NOTE — Telephone Encounter (Signed)
I called pt and she said she does not have a fever today. ( Yesterday it was 100.3) She has only had allergies and no chills. Her diarrhea has gotten worse since she had

## 2014-04-13 NOTE — Addendum Note (Signed)
Addended by: Tiffany KocherLEWIS, Gwenette Wellons S on: 04/13/2014 11:54 AM   Modules accepted: Orders

## 2014-04-13 NOTE — Telephone Encounter (Signed)
Her diarrhea has gotten worse since her endoscopy despite the fact that she is not using Lactose foods. She has had 3 watery episodes today already. Please advise!

## 2014-04-17 NOTE — Telephone Encounter (Signed)
AWAITING STOOL STUDIES. NEXT ROUTINE OPV APR 2016.

## 2014-04-18 NOTE — Telephone Encounter (Signed)
LMOM to call and let me know if she has done the stool studies yet. Also, a reminder that her next OV will be in April.

## 2014-06-04 ENCOUNTER — Encounter: Payer: Self-pay | Admitting: Gastroenterology

## 2014-06-13 ENCOUNTER — Encounter: Payer: Self-pay | Admitting: Gastroenterology

## 2014-06-22 ENCOUNTER — Emergency Department (HOSPITAL_COMMUNITY): Payer: 59

## 2014-06-22 ENCOUNTER — Encounter (HOSPITAL_COMMUNITY): Payer: Self-pay

## 2014-06-22 ENCOUNTER — Emergency Department (HOSPITAL_COMMUNITY)
Admission: EM | Admit: 2014-06-22 | Discharge: 2014-06-22 | Disposition: A | Discharge status: Home / Self Care | Payer: Self-pay | Attending: Emergency Medicine | Admitting: Emergency Medicine

## 2014-06-22 DIAGNOSIS — Z880 Allergy status to penicillin: Secondary | ICD-10-CM | POA: Insufficient documentation

## 2014-06-22 DIAGNOSIS — E039 Hypothyroidism, unspecified: Secondary | ICD-10-CM | POA: Insufficient documentation

## 2014-06-22 DIAGNOSIS — R0600 Dyspnea, unspecified: Secondary | ICD-10-CM | POA: Insufficient documentation

## 2014-06-22 DIAGNOSIS — Z79899 Other long term (current) drug therapy: Secondary | ICD-10-CM | POA: Insufficient documentation

## 2014-06-22 DIAGNOSIS — R079 Chest pain, unspecified: Secondary | ICD-10-CM | POA: Insufficient documentation

## 2014-06-22 DIAGNOSIS — K219 Gastro-esophageal reflux disease without esophagitis: Secondary | ICD-10-CM | POA: Insufficient documentation

## 2014-06-22 DIAGNOSIS — F419 Anxiety disorder, unspecified: Secondary | ICD-10-CM | POA: Insufficient documentation

## 2014-06-22 DIAGNOSIS — Z8669 Personal history of other diseases of the nervous system and sense organs: Secondary | ICD-10-CM | POA: Insufficient documentation

## 2014-06-22 DIAGNOSIS — Z7952 Long term (current) use of systemic steroids: Secondary | ICD-10-CM | POA: Insufficient documentation

## 2014-06-22 DIAGNOSIS — Z8619 Personal history of other infectious and parasitic diseases: Secondary | ICD-10-CM | POA: Insufficient documentation

## 2014-06-22 DIAGNOSIS — Z8719 Personal history of other diseases of the digestive system: Secondary | ICD-10-CM | POA: Insufficient documentation

## 2014-06-22 DIAGNOSIS — I10 Essential (primary) hypertension: Secondary | ICD-10-CM | POA: Insufficient documentation

## 2014-06-22 DIAGNOSIS — J45901 Unspecified asthma with (acute) exacerbation: Secondary | ICD-10-CM | POA: Insufficient documentation

## 2014-06-22 LAB — I-STAT TROPONIN, ED: TROPONIN I, POC: 0 ng/mL (ref 0.00–0.08)

## 2014-06-22 LAB — CBC WITH DIFFERENTIAL/PLATELET
BASOS PCT: 1 % (ref 0–1)
Basophils Absolute: 0 10*3/uL (ref 0.0–0.1)
EOS ABS: 0.1 10*3/uL (ref 0.0–0.7)
Eosinophils Relative: 2 % (ref 0–5)
HCT: 39.3 % (ref 36.0–46.0)
Hemoglobin: 14.1 g/dL (ref 12.0–15.0)
LYMPHS ABS: 2.2 10*3/uL (ref 0.7–4.0)
Lymphocytes Relative: 41 % (ref 12–46)
MCH: 37.7 pg — ABNORMAL HIGH (ref 26.0–34.0)
MCHC: 35.9 g/dL (ref 30.0–36.0)
MCV: 105.1 fL — ABNORMAL HIGH (ref 78.0–100.0)
Monocytes Absolute: 0.3 10*3/uL (ref 0.1–1.0)
Monocytes Relative: 6 % (ref 3–12)
NEUTROS PCT: 50 % (ref 43–77)
Neutro Abs: 2.7 10*3/uL (ref 1.7–7.7)
Platelets: 197 10*3/uL (ref 150–400)
RBC: 3.74 MIL/uL — AB (ref 3.87–5.11)
RDW: 14.9 % (ref 11.5–15.5)
WBC: 5.3 10*3/uL (ref 4.0–10.5)

## 2014-06-22 LAB — BASIC METABOLIC PANEL
ANION GAP: 11 (ref 5–15)
BUN: 12 mg/dL (ref 6–23)
CO2: 25 mmol/L (ref 19–32)
Calcium: 9.3 mg/dL (ref 8.4–10.5)
Chloride: 100 mmol/L (ref 96–112)
Creatinine, Ser: 0.78 mg/dL (ref 0.50–1.10)
GFR calc non Af Amer: >90 mL/min (ref >90)
Glucose, Bld: 92 mg/dL (ref 70–99)
Potassium: 3.6 mmol/L (ref 3.5–5.1)
Sodium: 136 mmol/L (ref 135–145)

## 2014-06-22 LAB — TSH: TSH: 5.836 u[IU]/mL — ABNORMAL HIGH (ref 0.350–4.500)

## 2014-06-22 MED ORDER — SODIUM CHLORIDE 0.9 % IV BOLUS (SEPSIS)
1000.0000 mL | Freq: Once | INTRAVENOUS | Status: AC
  Administered 2014-06-22: 1000 mL via INTRAVENOUS

## 2014-06-22 MED ORDER — LORAZEPAM 2 MG/ML IJ SOLN
1.0000 mg | Freq: Once | INTRAMUSCULAR | Status: AC
  Administered 2014-06-22: 1 mg via INTRAVENOUS
  Filled 2014-06-22: qty 1

## 2014-06-22 MED ORDER — MECLIZINE HCL 12.5 MG PO TABS
25.0000 mg | ORAL_TABLET | Freq: Once | ORAL | Status: AC
  Administered 2014-06-22: 25 mg via ORAL
  Filled 2014-06-22: qty 2

## 2014-06-22 NOTE — Discharge Instructions (Signed)

## 2014-06-22 NOTE — ED Provider Notes (Signed)
Pt reported mild dizziness and "head pressure" when standing While in bed she is in no distress, using phone and appears comfortable Will finish IV fluids then d/c home She has no focal neuro deficits to suggest stroke   Zadie Rhineonald Karene Bracken, MD 06/22/14 [id number]

## 2014-06-22 NOTE — ED Notes (Signed)
Ambulate pt. In hall, pt. Reports feeling dizzy and a pressure feeling in her head. Pt. Placed back in bed.

## 2014-06-22 NOTE — ED Provider Notes (Signed)
CSN: [id number]     Arrival date & time 06/22/14  0347 History   First MD Initiated Contact with Patient 06/22/14 0357     Chief Complaint  Patient presents with  . Chest Pain    Patient is a 39 y.o. female presenting with chest pain. The history is provided by the patient.  Chest Pain Pain location:  Substernal area Pain quality: pressure   Pain severity:  Moderate Onset quality:  Sudden Duration:  2 hours Timing:  Constant Progression:  Worsening Chronicity:  Recurrent Relieved by:  Nothing Worsened by:  Nothing tried Associated symptoms: anxiety, cough, dizziness, palpitations and shortness of breath   Associated symptoms: no abdominal pain, no fever and no syncope   Patient reports she woke up with chest pressure/SOB/palpitations No syncope She reports feeling anxious and unable to take deep breaths She reports h/o SVT and has required adenosine previously - this episode feels similar to prior She reports feeling well prior to going to bed She denies drug use She reports her last drink of ETOH was after dinner time    Past Medical History  Diagnosis Date  . SVT (supraventricular tachycardia)   . Hiatal hernia   . Anxiety   . Acid reflux   . Dyspepsia 2004    Dx w/ PUD but no EGD, [place], [place]   . Hypertriglyceridemia   . Hypothyroid   . Iritis     frequent  . Hypertension   . Asthma   . Panic attacks   . Thyroid nodule   . RLQ abdominal pain 01/11/2014  . Vaginal discharge 01/11/2014  . Trichomonal vaginitis 01/11/2014  . Weight gain 01/11/2014  . Irregular periods 01/11/2014  . 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 Alcohol abuse 07/21/2012   Past Surgical History  Procedure Laterality Date  . Esophagogastroduodenoscopy  11/06/2011    [name]: MILD Esophagitis/PATENT ESOPHAGEAL Stricture/  Moderate gastritis. Bx no.hpylori or celiac, +gastritis  . Esophagogastroduodenoscopy (egd) with propofol N/A 03/20/2014    Procedure: ESOPHAGOGASTRODUODENOSCOPY (EGD) WITH PROPOFOL;   Surgeon: West Bali, MD;  Location: [facility];  Service: Endoscopy;  Laterality: N/A;  . Savory dilation N/A 03/20/2014    Procedure: SAVORY DILATION;  Surgeon: West Bali, MD;  Location: [facility];  Service: Endoscopy;  Laterality: N/A;  dilated with # 12.8, 14,15,16   Family History  Problem Relation Age of Onset  . Stomach cancer Paternal Grandfather   . Cancer Paternal Grandfather     throat and esophagus  . Breast cancer Maternal Grandmother   . Cancer Maternal Grandmother     skin  . Anxiety disorder Maternal Grandmother   . Hypertension Mother   . Other Mother     fatty liver  . Hyperlipidemia Mother   . Other Father     varicose veins; stomach issues; hernia  . Hypertension Father   . Hyperlipidemia Father   . Cancer Father     prostate  . Arthritis Father     rheumatoid  . Thyroid disease Sister   . Other Brother     hernia  . Diabetes Maternal Grandfather   . Other Paternal Grandmother     hernia  . COPD Paternal Grandmother    History  Substance Use Topics  . Smoking status: Never Smoker   . Smokeless tobacco: Never Used  . Alcohol Use: Yes     Comment: twice a week   OB History    Gravida Para Term Preterm AB TAB SAB Ectopic Multiple Living  1    1 1          Review of Systems  Constitutional: Negative for fever.  Respiratory: Positive for cough and shortness of breath.   Cardiovascular: Positive for chest pain and palpitations. Negative for syncope.  Gastrointestinal: Negative for abdominal pain.  Neurological: Positive for dizziness. Negative for seizures and syncope.  Psychiatric/Behavioral: The patient is nervous/anxious.   All other systems reviewed and are negative.     Allergies  Penicillins and Lidocaine  Home Medications   Prior to Admission medications   Medication Sig Start Date End Date Taking? Authorizing Provider  cetirizine (ZYRTEC) 10 MG tablet Take 10 mg by mouth daily.   Yes Historical Provider, MD  dexlansoprazole  (DEXILANT) 60 MG capsule 1 PO EVERY MORNING WITH BREAKFAST. 03/20/14  Yes West BaliSandi L Fields, MD  dicyclomine (BENTYL) 10 MG capsule Take 1 capsule (10 mg total) by mouth 4 (four) times daily -  before meals and at bedtime. 04/13/14  Yes Tiffany KocherLeslie S Lewis, PA-C  metoprolol tartrate (LOPRESSOR) 25 MG tablet Take 25 mg by mouth daily as needed (for palpitations). 08/13/12  Yes Renae FickleMackenzie Short, MD  prednisoLONE acetate (PRED FORTE) 1 % ophthalmic suspension 1 drop 4 (four) times daily.   Yes Historical Provider, MD  Bioflavonoid Products (ESTER C PO) Take 1 tablet by mouth daily.     Historical Provider, MD  guaifenesin (HUMIBID E) 400 MG TABS tablet Take 400 mg by mouth daily.    Historical Provider, MD  Iodine, Kelp, (KELP PO) Take by mouth. Takes 4 drops daily.    Historical Provider, MD  lidocaine (XYLOCAINE) 2 % solution TAKE TWO TEASPOONSFUL BY MOUTH BEFORE MEALS AND AT BEDTIME TO PREVENT ABDOMINAL PAIN AFTER EATING - DO NOT EXCEED 8 DOSES PER DAY 04/09/14   Anice PaganiniEric A Gill, NP  Polyvinyl Alcohol-Povidone (REFRESH OP) Place 2 drops into both eyes as needed (dry eyes).    Historical Provider, MD  sucralfate (CARAFATE) 1 G tablet Take 1 tablet (1 g total) by mouth 4 (four) times daily. 08/13/12   Renae FickleMackenzie Short, MD   BP 110/67 mmHg  Pulse 93  Temp(Src) 98.4 F (36.9 C) (Oral)  Resp 17  Ht 5\' 4"  (1.626 m)  Wt 165 lb (74.844 kg)  BMI 28.31 kg/m2  SpO2 99% Physical Exam CONSTITUTIONAL: Well developed/well nourished, anxious HEAD: Normocephalic/atraumatic EYES: EOMI/PERRL ENMT: Mucous membranes moist NECK: supple no meningeal signs SPINE/BACK:entire spine nontender CV: tachycardic but no murmurs noted LUNGS: tachypneic but lung sounds are clear ABDOMEN: soft, nontender, no rebound or guarding, bowel sounds noted throughout abdomen GU:no cva tenderness NEURO: Pt is awake/alert/appropriate, moves all extremitiesx4.  No facial droop.  Pt is tremulous due to severe anxiety EXTREMITIES: pulses normal/equal,  full ROM SKIN: warm, color normal PSYCH: pt is very anxious  ED Course  Procedures   4:39 AM Pt appears to be having panic attack She is responding to ativan EKG does not reveal SVT/Afib/VTach Per chart, she has had previous unremarkable cardiac workup and is known to cardiology I have low suspicion for ACS at this time She has had 3 negative CT angio chest scan to r/o PE in our system since 2014 Will defer workup for PE at this time unless pt has significant hypoxia 4:56 AM Pt with some improvement in her HR Upon entering the room to re-evaluate patient, she became tremulous/anxious Ativan ordered 6:16 AM Pt appears improved She is resting comfortably She feels improved.  No active CP at this time No hypoxia to  suggest PE I doubt ACS (EKG without ischemic changes) Pt appeared to have strong component of anxiety I feel she is appropriate for d/c home She has no PCP - given referral info Advised TSH elevated which indicates hypothyroid.  Pt aware of need for followup BP 116/71 mmHg  Pulse 107  Temp(Src) 98.4 F (36.9 C) (Oral)  Resp 19  Ht  (1.626 m)  Wt 165 lb (74.844 kg)  BMI 28.31 kg/m2  SpO2 97%  Labs Review Labs Reviewed  CBC WITH DIFFERENTIAL/PLATELET - Abnormal; Notable for the following:    RBC 3.74 (*)    MCV 105.1 (*)    MCH 37.7 (*)    All other components within normal limits  TSH - Abnormal; Notable for the following:    TSH 5.836 (*)    All other components within normal limits  BASIC METABOLIC PANEL  I-STAT TROPOININ, ED    Imaging Review Dg Chest Portable 1 View  06/22/2014   CLINICAL DATA:  Chest pressure and palpitations.  EXAM: PORTABLE CHEST - 1 VIEW  COMPARISON:  11/02/2013  FINDINGS: A single AP portable view of the chest demonstrates no focal airspace consolidation or alveolar edema. The lungs are grossly clear. There is no large effusion or pneumothorax. Cardiac and mediastinal contours appear unremarkable.  IMPRESSION: No active  disease.   Electronically Signed   By: Ellery Plunk M.D.   On: 06/22/2014 04:38     EKG Interpretation   Date/Time:  Friday June 22 2014 04:02:30 EDT Ventricular Rate:  135 PR Interval:  124 QRS Duration: 81 QT Interval:  319 QTC Calculation: 478 R Axis:   44 Text Interpretation:  Sinus tachycardia Non-specific ST-t changes Consider  right atrial enlargement No significant change since last tracing  Confirmed by Bebe Shaggy  MD, Carlita Whitcomb ([id number]) on 06/22/2014 4:19:35 AM     Repeat EKG:  EKG Interpretation  Date/Time:  Friday June 22 2014 04:29:32 EDT Ventricular Rate:  98 PR Interval:  149 QRS Duration: 85 QT Interval:  380 QTC Calculation: 485 R Axis:   30 Text Interpretation:  Sinus rhythm Abnormal R-wave progression, early transition Borderline prolonged QT interval Confirmed by Bebe Shaggy  MD, Dorinda Hill ([id number]) on 06/22/2014 4:34:57 AM      Medications  sodium chloride 0.9 % bolus 1,000 mL (not administered)  LORazepam (ATIVAN) injection 1 mg (1 mg Intravenous Given 06/22/14 0407)  sodium chloride 0.9 % bolus 1,000 mL (0 mLs Intravenous Stopped 06/22/14 0451)  LORazepam (ATIVAN) injection 1 mg (1 mg Intravenous Given 06/22/14 0449)    MDM   Final diagnoses:  Chest pain, unspecified chest pain type  Hypothyroidism, unspecified hypothyroidism type  Dyspnea    Nursing notes including past medical history and social history reviewed and considered in documentation xrays/imaging reviewed by myself and considered during evaluation Labs/vital reviewed myself and considered during evaluation Previous records reviewed and considered     Zadie Rhine, MD 06/22/14 [id number]

## 2014-06-22 NOTE — ED Notes (Signed)
Pt reports chest pressure that started approx 2 hours ago, states she thought she was in SVT, tried some vagal maneuvers without relief.  Pt also c/o headache

## 2014-07-19 ENCOUNTER — Ambulatory Visit: Payer: 59 | Admitting: Gastroenterology

## 2014-07-20 ENCOUNTER — Telehealth: Payer: Self-pay

## 2014-07-20 ENCOUNTER — Encounter: Payer: Self-pay | Admitting: Gastroenterology

## 2014-07-20 ENCOUNTER — Telehealth: Payer: Self-pay | Admitting: Gastroenterology

## 2014-07-20 ENCOUNTER — Ambulatory Visit: Payer: 59 | Admitting: Gastroenterology

## 2014-07-20 NOTE — Telephone Encounter (Signed)
Pt called and she is not on thyroid medication and is concern about her not being on medication. States she has hypothyroidism. States she doesn't have a PCP. Missed her appointment today and is concerned about not being on meds.

## 2014-07-20 NOTE — Telephone Encounter (Signed)
PATIENT WAS A NO SHOW AND LETTER WAS SENT  °

## 2014-07-20 NOTE — Telephone Encounter (Signed)
Patient was seen in ER 06/22/2014 and TSH was minimally elevated at that time. She was told by ER doctor she would need follow up. We manage GI issues only. She needs to find a PCP. We can provide her with a list if needed.   Lab Results  Component Value Date   TSH 5.836* 06/22/2014

## 2014-07-23 NOTE — Telephone Encounter (Signed)
I spoke with the patient and found out that she is uninsured, so I gave her some options like the [facility] and [facility].    I provided her with contact information for both.  She stated she will give Andres EgeShaunesi a call with [facility] at [phone] and she already tried the free clinic, however they can not see her until June 2016.

## 2014-07-23 NOTE — Telephone Encounter (Signed)
Pt called office back and was placed on hold to speak with office manager. Pt hung up

## 2014-07-23 NOTE — Telephone Encounter (Signed)
Called  and LMOM for pt to call office back 

## 2014-07-23 NOTE — Telephone Encounter (Signed)
Noted  

## 2014-08-07 ENCOUNTER — Ambulatory Visit (INDEPENDENT_AMBULATORY_CARE_PROVIDER_SITE_OTHER): Payer: 59 | Admitting: Nurse Practitioner

## 2014-08-07 ENCOUNTER — Encounter: Payer: Self-pay | Admitting: Nurse Practitioner

## 2014-08-07 VITALS — BP 114/74 | HR 79 | Temp 97.6°F | Ht 64.5 in | Wt 163.4 lb

## 2014-08-07 DIAGNOSIS — R1030 Lower abdominal pain, unspecified: Secondary | ICD-10-CM

## 2014-08-07 DIAGNOSIS — R197 Diarrhea, unspecified: Secondary | ICD-10-CM

## 2014-08-07 DIAGNOSIS — R1310 Dysphagia, unspecified: Secondary | ICD-10-CM

## 2014-08-07 DIAGNOSIS — R109 Unspecified abdominal pain: Secondary | ICD-10-CM | POA: Insufficient documentation

## 2014-08-07 NOTE — Assessment & Plan Note (Signed)
Patient with persistent diarrhea including nocturnal diarrhea. States most of her symptoms then recur from 3 AM to 9 AM. However during the day, her symptoms happen 10 minutes after eating. She's had an extensive workup to this point. Is not a candidate for Viberzi for possible IBS with diarrhea because of chronic alcohol use including a history of alcoholic hepatitis. The patient has not had a stool pancreatic elastase ordered so we will proceed with that today to check for exocrine pancreatic insufficiency. Return for routine follow-up in 3 months to evaluate labs and proceed with further plans.

## 2014-08-07 NOTE — Patient Instructions (Addendum)
1. Return to stool samples of the lab is seen she can. 2. Return for follow-up in 3 months. 3. We will call you with your lab results. 4. We'll have a swallowing study done to reevaluate your difficulty swallowing. 5. Continue soft diet with chopped meats.

## 2014-08-07 NOTE — Assessment & Plan Note (Signed)
Patient with some persistent states his symptoms despite recent endoscopy and dilation. Recommend continue soft diet with chopped meats and soft vegetables as previously recommended. We'll order a barium pill esophagram to further evaluate. Return for routine follow-up in 3 months.

## 2014-08-07 NOTE — Progress Notes (Signed)
No pcp per patient 

## 2014-08-07 NOTE — Progress Notes (Signed)
Referring Provider: No ref. provider found Primary Care Physician:  No PCP Per Patient Primary GI: Dr. Darrick Penna  Chief Complaint  Patient presents with  . Follow-up    HPI:   39 year old female presents for routine follow-up on endoscopy, diarrhea, gastritis. EGD completed 03/20/2014 found mild esophagitis, distal esophageal stricture which was dilated, small hiatal hernia, moderate nonerosive gastritis and mild duodenitis. Was advised to restart Dexilant, chopped or ground meat and soft vegetable diet. Biopsies of the stomach showed reactive gastropathy, esophageal biopsy showed gastroesophageal junction mucosa with increased eosinophils, and esophagus biopsies showed benign squamous mucosa. Patient was advised to avoid all dairy products.  Today she states she's doing about the same. Had initial improvement but then 2 months ago symptoms returned. 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 Bentyl isn't helping as "a side effect of the Bentyl is diarrhea and I get that." Starts having diarrhea about 3 am and will go about every hour until about 9 am. Admits "severe" periumbilical abdominal pain and lower abdominal pain and feels like cramping, feels like "menstrual cramping." Symptoms are worse with eating. States when she eats she's in the bathroom within 10 minutes, except for when she has diarrhea at 3 am, when she eats at 10 pm and her symptoms won't start until several hours later. Denies hematochezia, melena. Has a sensation of incomplete emptying. States she's not drinking milk, but will eat cheese still which helps "stop her up a little." Denies fever, chills, unintentional weight loss, chest pain, dyspnea. Has also had a return of her dysphagia symptoms, does chop meats finely and use gravy to soften. Denies pill dysphagia. Solid food dysphagia is typically with dry foods. Denies any other upper or lower GI symptoms. Was sent a list of PCPs but couldn't get an appointment until end of June, which has been  scheduled.  Past Medical History  Diagnosis Date  . SVT (supraventricular tachycardia)   . Hiatal hernia   . Anxiety   . Acid reflux   . Dyspepsia 2004    Dx w/ PUD but no EGD, [place], [place]   . Hypertriglyceridemia   . Hypothyroid   . Iritis     frequent  . Hypertension   . Asthma   . Panic attacks   . Thyroid nodule   . RLQ abdominal pain 01/11/2014  . Vaginal discharge 01/11/2014  . Trichomonal vaginitis 01/11/2014  . Weight gain 01/11/2014  . Irregular periods 01/11/2014  . 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 Alcohol abuse 07/21/2012    Past Surgical History  Procedure Laterality Date  . Esophagogastroduodenoscopy  11/06/2011    [name]: MILD Esophagitis/PATENT ESOPHAGEAL Stricture/  Moderate gastritis. Bx no.hpylori or celiac, +gastritis  . Esophagogastroduodenoscopy (egd) with propofol N/A 03/20/2014    [name]: 1. Mild esophagitis & distal esophagela stricture. 2. small hiatal hernia 3. moderate non-erosive gastritis and mild duodenits  . Savory dilation N/A 03/20/2014    Procedure: SAVORY DILATION;  Surgeon: West Bali, MD;  Location: [facility];  Service: Endoscopy;  Laterality: N/A;  dilated with # 12.8, 14,15,16    Current Outpatient Prescriptions  Medication Sig Dispense Refill  . cetirizine (ZYRTEC) 10 MG tablet Take 10 mg by mouth daily.    Marland Kitchen dexlansoprazole (DEXILANT) 60 MG capsule 1 PO EVERY MORNING WITH BREAKFAST. 30 capsule 11  . dicyclomine (BENTYL) 10 MG capsule Take 1 capsule (10 mg total) by mouth 4 (four) times daily -  before meals and at bedtime. 120 capsule 1  . guaifenesin (HUMIBID E)  400 MG TABS tablet Take 400 mg by mouth daily.    . Iodine, Kelp, (KELP PO) Take by mouth. Takes 4 drops daily.    . metoprolol tartrate (LOPRESSOR) 25 MG tablet Take 25 mg by mouth daily as needed (for palpitations).    . Polyvinyl Alcohol-Povidone (REFRESH OP) Place 2 drops into both eyes as needed (dry eyes).    . prednisoLONE acetate (PRED FORTE) 1 % ophthalmic suspension 1 drop 4  (four) times daily.    Marland Kitchen. Bioflavonoid Products (ESTER C PO) Take 1 tablet by mouth daily.     Marland Kitchen. lidocaine (XYLOCAINE) 2 % solution TAKE TWO TEASPOONSFUL BY MOUTH BEFORE MEALS AND AT BEDTIME TO PREVENT ABDOMINAL PAIN AFTER EATING - DO NOT EXCEED 8 DOSES PER DAY (Patient not taking: Reported on 08/07/2014) 200 mL 0  . sucralfate (CARAFATE) 1 G tablet Take 1 tablet (1 g total) by mouth 4 (four) times daily. (Patient not taking: Reported on 08/07/2014) 120 tablet 0   No current facility-administered medications for this visit.    Allergies as of 08/07/2014 - Review Complete 03/20/2014  Allergen Reaction Noted  . Penicillins Anaphylaxis 10/18/2011  . Lidocaine  06/22/2014    Family History  Problem Relation Age of Onset  . Stomach cancer Paternal Grandfather   . Cancer Paternal Grandfather     throat and esophagus  . Breast cancer Maternal Grandmother   . Cancer Maternal Grandmother     skin  . Anxiety disorder Maternal Grandmother   . Hypertension Mother   . Other Mother     fatty liver  . Hyperlipidemia Mother   . Other Father     varicose veins; stomach issues; hernia  . Hypertension Father   . Hyperlipidemia Father   . Cancer Father     prostate  . Arthritis Father     rheumatoid  . Thyroid disease Sister   . Other Brother     hernia  . Diabetes Maternal Grandfather   . Other Paternal Grandmother     hernia  . COPD Paternal Grandmother     History   Social History  . Marital Status: Married    Spouse Name: N/A  . Number of Children: 0  . 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 Smoking status: Never Smoker   . Smokeless tobacco: Never Used  . Alcohol Use: Yes     Comment: twice a week  . Drug Use: No  . Sexual Activity: Yes    Birth Control/ Protection: None     Comment: depo   Other Topics Concern  . None   Social History Narrative   Lives w/ grandfather or parents or family member ([place]oved  from [place] month[place]   Previous MD: Phill MutterAnn Lewis, NP ([place], [place])          Review of Systems: General: Negative for anorexia, weight loss, fever. Eyes: Negative for vision changes.  ENT: Negative for hoarseness. CV: Negative for chest pain, angina, palpitations, peripheral edema.  Respiratory: Negative for dyspnea at rest, cough, sputum, wheezing.  GI: See history of present illness. MS: Negative for joint pain, low back pain.  Derm: Negative for rash or itching.  Neuro: Negative for weakness, seizure, memory loss, confusion.  Psych: Negative for anxiety, depression.  Endo: Negative for unusual weight change.  Heme: Negative for bruising or bleeding. Allergy: Negative for rash or hives.   Physical Exam: BP 114/74 mmHg  Pulse 79  Temp(Src) 97.6 F (36.4 C) (Oral)  Ht 5' 4.5" (1.638 m)  Wt 163 lb 6.4 oz (74.118 kg)  BMI 27.62 kg/m2  LMP 07/17/2014 General:   Alert and oriented. No distress noted. Pleasant and cooperative.  Head:  Normocephalic and atraumatic. Eyes:  Conjuctiva clear without scleral icterus. Lungs:  Clear to auscultation bilaterally. No wheezes, rales, or rhonchi. No distress.  Heart:  S1, S2 present without murmurs, rubs, or gallops. Regular rate and rhythm. Abdomen:  +BS, soft, and non-distended. Mild lower abdominal TTP. No rebound or guarding. No HSM or masses noted. Msk:  Symmetrical without gross deformities. Normal posture. Extremities:  Without edema. Neurologic:  Alert and  oriented x4;  grossly normal neurologically. Skin:  Intact without significant lesions or rashes. Psych:  Alert and cooperative. Normal mood and affect.    08/07/2014 8:33 AM

## 2014-08-07 NOTE — Assessment & Plan Note (Signed)
Patient has continued periumbilical and lower abdominal pain which feels like cramping. Symptoms are worse after eating and 10 to relieve after having a bowel movement. Her symptoms tend to occur most often at 3 AM at which point she'll have symptoms 3 to 9:00 in the morning approximately. However during the day her symptoms occur 10 minutes after eating. Abdominal pain likely related to persistent diarrhea. Return for follow-up in 3 months after testing to discuss further options.

## 2014-08-24 ENCOUNTER — Emergency Department (HOSPITAL_COMMUNITY)
Admission: EM | Admit: 2014-08-24 | Discharge: 2014-08-24 | Disposition: A | Discharge status: Home / Self Care | Payer: Self-pay | Attending: Emergency Medicine | Admitting: Emergency Medicine

## 2014-08-24 ENCOUNTER — Encounter (HOSPITAL_COMMUNITY): Payer: Self-pay | Admitting: *Deleted

## 2014-08-24 ENCOUNTER — Emergency Department (HOSPITAL_COMMUNITY): Payer: Self-pay

## 2014-08-24 DIAGNOSIS — I10 Essential (primary) hypertension: Secondary | ICD-10-CM | POA: Insufficient documentation

## 2014-08-24 DIAGNOSIS — Z8659 Personal history of other mental and behavioral disorders: Secondary | ICD-10-CM | POA: Insufficient documentation

## 2014-08-24 DIAGNOSIS — K219 Gastro-esophageal reflux disease without esophagitis: Secondary | ICD-10-CM | POA: Insufficient documentation

## 2014-08-24 DIAGNOSIS — Z3202 Encounter for pregnancy test, result negative: Secondary | ICD-10-CM | POA: Insufficient documentation

## 2014-08-24 DIAGNOSIS — Z79899 Other long term (current) drug therapy: Secondary | ICD-10-CM | POA: Insufficient documentation

## 2014-08-24 DIAGNOSIS — Z880 Allergy status to penicillin: Secondary | ICD-10-CM | POA: Insufficient documentation

## 2014-08-24 DIAGNOSIS — Z8639 Personal history of other endocrine, nutritional and metabolic disease: Secondary | ICD-10-CM | POA: Insufficient documentation

## 2014-08-24 DIAGNOSIS — Z87448 Personal history of other diseases of urinary system: Secondary | ICD-10-CM | POA: Insufficient documentation

## 2014-08-24 DIAGNOSIS — Z9889 Other specified postprocedural states: Secondary | ICD-10-CM | POA: Insufficient documentation

## 2014-08-24 DIAGNOSIS — R1013 Epigastric pain: Secondary | ICD-10-CM | POA: Insufficient documentation

## 2014-08-24 DIAGNOSIS — Z8619 Personal history of other infectious and parasitic diseases: Secondary | ICD-10-CM | POA: Insufficient documentation

## 2014-08-24 DIAGNOSIS — J45909 Unspecified asthma, uncomplicated: Secondary | ICD-10-CM | POA: Insufficient documentation

## 2014-08-24 DIAGNOSIS — R1011 Right upper quadrant pain: Secondary | ICD-10-CM | POA: Insufficient documentation

## 2014-08-24 DIAGNOSIS — R112 Nausea with vomiting, unspecified: Secondary | ICD-10-CM | POA: Insufficient documentation

## 2014-08-24 DIAGNOSIS — R109 Unspecified abdominal pain: Secondary | ICD-10-CM

## 2014-08-24 LAB — COMPREHENSIVE METABOLIC PANEL
ALBUMIN: 4.2 g/dL (ref 3.5–5.0)
ALK PHOS: 77 U/L (ref 38–126)
ALT: 47 U/L (ref 14–54)
AST: 73 U/L — ABNORMAL HIGH (ref 15–41)
Anion gap: 11 (ref 5–15)
BUN: 8 mg/dL (ref 6–20)
CO2: 24 mmol/L (ref 22–32)
Calcium: 9.1 mg/dL (ref 8.9–10.3)
Chloride: 107 mmol/L (ref 101–111)
Creatinine, Ser: 0.76 mg/dL (ref 0.44–1.00)
GFR calc Af Amer: >60 mL/min (ref >60)
GFR calc non Af Amer: >60 mL/min (ref >60)
Glucose, Bld: 90 mg/dL (ref 65–99)
POTASSIUM: 3.3 mmol/L — AB (ref 3.5–5.1)
SODIUM: 142 mmol/L (ref 135–145)
TOTAL PROTEIN: 7.3 g/dL (ref 6.5–8.1)
Total Bilirubin: 0.6 mg/dL (ref 0.3–1.2)

## 2014-08-24 LAB — CBC WITH DIFFERENTIAL/PLATELET
BASOS ABS: 0 10*3/uL (ref 0.0–0.1)
Basophils Relative: 1 % (ref 0–1)
Eosinophils Absolute: 0.1 10*3/uL (ref 0.0–0.7)
Eosinophils Relative: 1 % (ref 0–5)
HCT: 39.5 % (ref 36.0–46.0)
Hemoglobin: 13.8 g/dL (ref 12.0–15.0)
Lymphocytes Relative: 27 % (ref 12–46)
Lymphs Abs: 1.3 10*3/uL (ref 0.7–4.0)
MCH: 36.6 pg — ABNORMAL HIGH (ref 26.0–34.0)
MCHC: 34.9 g/dL (ref 30.0–36.0)
MCV: 104.8 fL — ABNORMAL HIGH (ref 78.0–100.0)
MONO ABS: 0.2 10*3/uL (ref 0.1–1.0)
Monocytes Relative: 4 % (ref 3–12)
NEUTROS ABS: 3.2 10*3/uL (ref 1.7–7.7)
NEUTROS PCT: 67 % (ref 43–77)
Platelets: 201 10*3/uL (ref 150–400)
RBC: 3.77 MIL/uL — ABNORMAL LOW (ref 3.87–5.11)
RDW: 14.1 % (ref 11.5–15.5)
WBC: 4.8 10*3/uL (ref 4.0–10.5)

## 2014-08-24 LAB — LIPASE, BLOOD: LIPASE: 33 U/L (ref 22–51)

## 2014-08-24 LAB — URINALYSIS, ROUTINE W REFLEX MICROSCOPIC
Bilirubin Urine: NEGATIVE
GLUCOSE, UA: NEGATIVE mg/dL
Hgb urine dipstick: NEGATIVE
KETONES UR: NEGATIVE mg/dL
Leukocytes, UA: NEGATIVE
Nitrite: NEGATIVE
PROTEIN: NEGATIVE mg/dL
Specific Gravity, Urine: 1.02 (ref 1.005–1.030)
Urobilinogen, UA: 0.2 mg/dL (ref 0.0–1.0)
pH: 8 (ref 5.0–8.0)

## 2014-08-24 LAB — PREGNANCY, URINE: Preg Test, Ur: NEGATIVE

## 2014-08-24 MED ORDER — ONDANSETRON HCL 4 MG/2ML IJ SOLN
4.0000 mg | Freq: Once | INTRAMUSCULAR | Status: AC
  Administered 2014-08-24: 4 mg via INTRAVENOUS
  Filled 2014-08-24: qty 2

## 2014-08-24 MED ORDER — MORPHINE SULFATE 4 MG/ML IJ SOLN
4.0000 mg | Freq: Once | INTRAMUSCULAR | Status: AC
Start: 2014-08-24 — End: 2014-08-24
  Administered 2014-08-24: 4 mg via INTRAVENOUS
  Filled 2014-08-24: qty 1

## 2014-08-24 MED ORDER — SODIUM CHLORIDE 0.9 % IV BOLUS (SEPSIS)
1000.0000 mL | Freq: Once | INTRAVENOUS | Status: AC
  Administered 2014-08-24: 1000 mL via INTRAVENOUS

## 2014-08-24 MED ORDER — ONDANSETRON HCL 4 MG PO TABS: 4.0000 mg | ORAL_TABLET | Freq: Three times a day (TID) | ORAL | Status: DC | PRN

## 2014-08-24 MED ORDER — MORPHINE SULFATE 4 MG/ML IJ SOLN
4.0000 mg | Freq: Once | INTRAMUSCULAR | Status: AC
  Administered 2014-08-24: 4 mg via INTRAVENOUS
  Filled 2014-08-24: qty 1

## 2014-08-24 MED ORDER — PANTOPRAZOLE SODIUM 40 MG IV SOLR
40.0000 mg | Freq: Once | INTRAVENOUS | Status: AC
  Administered 2014-08-24: 40 mg via INTRAVENOUS
  Filled 2014-08-24: qty 40

## 2014-08-24 MED ORDER — ONDANSETRON HCL 4 MG/2ML IJ SOLN: 4.0000 mg | Freq: Once | INTRAMUSCULAR | Status: DC

## 2014-08-24 MED ORDER — OXYCODONE-ACETAMINOPHEN 5-325 MG PO TABS: 1.0000 | ORAL_TABLET | Freq: Four times a day (QID) | ORAL | Status: DC | PRN

## 2014-08-24 NOTE — ED Notes (Signed)
Dr Horton at bedside speaking with pt and family  

## 2014-08-24 NOTE — ED Notes (Signed)
Pt transported to [facility]

## 2014-08-24 NOTE — Discharge Instructions (Signed)
You were seen today for abdominal pain. Your lab work is reassuring. Your ultrasound shows fatty liver, which is unchanged from your last ultrasound. You need to follow-up with your GI specialist. He will be given a short course of pain medication. You should continue your PPI at home.   Abdominal Pain, Women Abdominal (stomach, pelvic, or belly) pain can be caused by many things. It is important to tell your doctor:  The location of the pain.  Does it come and go or is it present all the time?  Are there things that start the pain (eating certain foods, exercise)?  Are there other symptoms associated with the pain (fever, nausea, vomiting, diarrhea)? All of this is helpful to know when trying to find the cause of the pain. CAUSES   Stomach: virus or bacteria infection, or ulcer.  Intestine: appendicitis (inflamed appendix), regional ileitis (Crohn's disease), ulcerative colitis (inflamed colon), irritable bowel syndrome, diverticulitis (inflamed diverticulum of the colon), or cancer of the stomach or intestine.  Gallbladder disease or stones in the gallbladder.  Kidney disease, kidney stones, or infection.  Pancreas infection or cancer.  Fibromyalgia (pain disorder).  Diseases of the female organs:  Uterus: fibroid (non-cancerous) tumors or infection.  Fallopian tubes: infection or tubal pregnancy.  Ovary: cysts or tumors.  Pelvic adhesions (scar tissue).  Endometriosis (uterus lining tissue growing in the pelvis and on the pelvic organs).  Pelvic congestion syndrome (female organs filling up with blood just before the menstrual period).  Pain with the menstrual period.  Pain with ovulation (producing an egg).  Pain with an IUD (intrauterine device, birth control) in the uterus.  Cancer of the female organs.  Functional pain (pain not caused by a disease, may improve without treatment).  Psychological pain.  Depression. DIAGNOSIS  Your doctor will decide the  seriousness of your pain by doing an examination.  Blood tests.  X-rays.  Ultrasound.  CT scan (computed tomography, special type of X-ray).  MRI (magnetic resonance imaging).  Cultures, for infection.  Barium enema (dye inserted in the large intestine, to better view it with X-rays).  Colonoscopy (looking in intestine with a lighted tube).  Laparoscopy (minor surgery, looking in abdomen with a lighted tube).  Major abdominal exploratory surgery (looking in abdomen with a large incision). TREATMENT  The treatment will depend on the cause of the pain.   Many cases can be observed and treated at home.  Over-the-counter medicines recommended by your caregiver.  Prescription medicine.  Antibiotics, for infection.  Birth control pills, for painful periods or for ovulation pain.  Hormone treatment, for endometriosis.  Nerve blocking injections.  Physical therapy.  Antidepressants.  Counseling with a psychologist or psychiatrist.  Minor or major surgery. HOME CARE INSTRUCTIONS   Do not take laxatives, unless directed by your caregiver.  Take over-the-counter pain medicine only if ordered by your caregiver. Do not take aspirin because it can cause an upset stomach or bleeding.  Try a clear liquid diet (broth or water) as ordered by your caregiver. Slowly move to a bland diet, as tolerated, if the pain is related to the stomach or intestine.  Have a thermometer and take your temperature several times a day, and record it.  Bed rest and sleep, if it helps the pain.  Avoid sexual intercourse, if it causes pain.  Avoid stressful situations.  Keep your follow-up appointments and tests, as your caregiver orders.  If the pain does not go away with medicine or surgery, you may try:  Acupuncture.  Relaxation exercises (yoga, meditation).  Group therapy.  Counseling. SEEK MEDICAL CARE IF:   You notice certain foods cause stomach pain.  Your home care  treatment is not helping your pain.  You need stronger pain medicine.  You want your IUD removed.  You feel faint or lightheaded.  You develop nausea and vomiting.  You develop a rash.  You are having side effects or an allergy to your medicine. SEEK IMMEDIATE MEDICAL CARE IF:   Your pain does not go away or gets worse.  You have a fever.  Your pain is felt only in portions of the abdomen. The right side could possibly be appendicitis. The left lower portion of the abdomen could be colitis or diverticulitis.  You are passing blood in your stools (bright red or black tarry stools, with or without vomiting).  You have blood in your urine.  You develop chills, with or without a fever.  You pass out. MAKE SURE YOU:   Understand these instructions.  Will watch your condition.  Will get help right away if you are not doing well or get worse. Document Released: 01/18/2007 Document Revised: 08/07/2013 Document Reviewed: 02/07/2009 [employer] Patient Information 2015 [place], [place]. This information is not intended to replace advice given to you by your health care provider. Make sure you discuss any questions you have with your health care provider.

## 2014-08-24 NOTE — Progress Notes (Signed)
REVIEWED-NO ADDITIONAL RECOMMENDATIONS. 

## 2014-08-24 NOTE — ED Notes (Signed)
Pt states that she woke up with right upper quad abd pain, n/v. Has had problems with pain like this before, states that they have "checked" her gallbadder already,

## 2014-08-24 NOTE — ED Provider Notes (Signed)
CSN: [id number]     Arrival date & time 08/24/14  0307 History   First MD Initiated Contact with Patient 08/24/14 0315     Chief Complaint  Patient presents with  . Abdominal Pain     (Consider location/radiation/quality/duration/timing/severity/associated sxs/prior Treatment) HPI  This is a 39 year old female with a history of hiatal hernia, acid reflux, dyspepsia who presents with abdominal pain. Patient reports acute onset of abdominal pain earlier this morning. It woke her up from sleep. She reports associated nausea and vomiting.  She reports that the pain is sharp and nonradiating. It stays in the epigastrium and right upper quadrant. Current pain is 7 out of 10. She denies any urinary symptoms or diarrhea. She denies any fevers, chest pain, shortness of breath.  In 2013 have right upper quadrant ultrasound was negative for gallstones and HIDA scan that was normal.  Follows closely with gastroenterology, Dr. Darrick Penna.  Past Medical History  Diagnosis Date  . SVT (supraventricular tachycardia)   . Hiatal hernia   . Anxiety   . Acid reflux   . Dyspepsia 2004    Dx w/ PUD but no EGD, [place], [place]   . Hypertriglyceridemia   . Hypothyroid   . Iritis     frequent  . Hypertension   . Asthma   . Panic attacks   . Thyroid nodule   . RLQ abdominal pain 01/11/2014  . Vaginal discharge 01/11/2014  . Trichomonal vaginitis 01/11/2014  . Weight gain 01/11/2014  . Irregular periods 01/11/2014  . 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 Alcohol abuse 07/21/2012   Past Surgical History  Procedure Laterality Date  . Esophagogastroduodenoscopy  11/06/2011    [name]: MILD Esophagitis/PATENT ESOPHAGEAL Stricture/  Moderate gastritis. Bx no.hpylori or celiac, +gastritis  . Esophagogastroduodenoscopy (egd) with propofol N/A 03/20/2014    [name]: 1. Mild esophagitis & distal esophagela stricture. 2. small hiatal hernia 3. moderate non-erosive gastritis and mild duodenits  . Savory dilation N/A 03/20/2014   Procedure: SAVORY DILATION;  Surgeon: West Bali, MD;  Location: [facility];  Service: Endoscopy;  Laterality: N/A;  dilated with # 12.8, 14,15,16   Family History  Problem Relation Age of Onset  . Stomach cancer Paternal Grandfather   . Cancer Paternal Grandfather     throat and esophagus  . Breast cancer Maternal Grandmother   . Cancer Maternal Grandmother     skin  . Anxiety disorder Maternal Grandmother   . Hypertension Mother   . Other Mother     fatty liver  . Hyperlipidemia Mother   . Other Father     varicose veins; stomach issues; hernia  . Hypertension Father   . Hyperlipidemia Father   . Cancer Father     prostate  . Arthritis Father     rheumatoid  . Thyroid disease Sister   . Other Brother     hernia  . Diabetes Maternal Grandfather   . Other Paternal Grandmother     hernia  . COPD Paternal Grandmother    History  Substance Use Topics  . Smoking status: Never Smoker   . Smokeless tobacco: Never Used  . Alcohol Use: 0.0 oz/week    0 Standard drinks or equivalent per week     Comment: 3-4 times a week, typically 4 drinks per occasion.   OB History    Gravida Para Term Preterm AB TAB SAB Ectopic Multiple Living   Review of Systems  Constitutional: Negative for fever.  Respiratory: Negative for cough, chest tightness and shortness of breath.   Cardiovascular: Negative for chest pain.  Gastrointestinal: Positive for nausea, vomiting and abdominal pain. Negative for diarrhea and constipation.  Genitourinary: Negative for dysuria.  Musculoskeletal: Negative for back pain.  Neurological: Negative for headaches.  Psychiatric/Behavioral: Negative for confusion.  All other systems reviewed and are negative.     Allergies  Penicillins and Lidocaine  Home Medications   Prior to Admission medications   Medication Sig Start Date End Date Taking? Authorizing Provider  dexlansoprazole (DEXILANT) 60 MG capsule 1 PO EVERY MORNING WITH  BREAKFAST. 03/20/14  Yes West BaliSandi L Fields, MD  metoprolol tartrate (LOPRESSOR) 25 MG tablet Take 25 mg by mouth daily as needed (for palpitations). 08/13/12  Yes Renae FickleMackenzie Short, MD  cetirizine (ZYRTEC) 10 MG tablet Take 10 mg by mouth daily.    Historical Provider, MD  dicyclomine (BENTYL) 10 MG capsule Take 1 capsule (10 mg total) by mouth 4 (four) times daily -  before meals and at bedtime. 04/13/14   Tiffany KocherLeslie S Lewis, PA-C  guaifenesin (HUMIBID E) 400 MG TABS tablet Take 400 mg by mouth daily.    Historical Provider, MD  Iodine, Kelp, (KELP PO) Take by mouth. Takes 4 drops daily.    Historical Provider, MD  Polyvinyl Alcohol-Povidone (REFRESH OP) Place 2 drops into both eyes as needed (dry eyes).    Historical Provider, MD  prednisoLONE acetate (PRED FORTE) 1 % ophthalmic suspension 1 drop 4 (four) times daily.    Historical Provider, MD   BP 112/77 mmHg  Pulse 82  Temp(Src) 98.1 F (36.7 C) (Oral)  Resp 20  Ht 5' 4.5" (1.638 m)  Wt 159 lb (72.122 kg)  BMI 26.88 kg/m2  SpO2 98%  LMP 08/12/2014 Physical Exam  Constitutional: She is oriented to person, place, and time. She appears well-developed and well-nourished. No distress.  HENT:  Head: Normocephalic and atraumatic.  Cardiovascular: Normal rate, regular rhythm and normal heart sounds.   Pulmonary/Chest: Effort normal. No respiratory distress. She has no wheezes.  Abdominal: Soft. Bowel sounds are normal. There is tenderness. There is no rebound and no guarding.  Epigastric and right upper quadrant tenderness to palpation without rebound or guarding  Musculoskeletal: She exhibits no edema.  Neurological: She is alert and oriented to person, place, and time.  Skin: Skin is warm and dry.  Psychiatric: She has a normal mood and affect.  Nursing note and vitals reviewed.   ED Course  Procedures (including critical care time) Labs Review Labs Reviewed  CBC WITH DIFFERENTIAL/PLATELET - Abnormal; Notable for the following:    RBC 3.77  (*)    MCV 104.8 (*)    MCH 36.6 (*)    All other components within normal limits  COMPREHENSIVE METABOLIC PANEL - Abnormal; Notable for the following:    Potassium 3.3 (*)    AST 73 (*)    All other components within normal limits  LIPASE, BLOOD  URINALYSIS, ROUTINE W REFLEX MICROSCOPIC  PREGNANCY, URINE    Imaging Review No results found.   EKG Interpretation None      MDM   Final diagnoses:  None    Patient presents with epigastric and right upper quadrant tenderness to palpation. Nontoxic on exam. Is tender. History of hiatal hernia and reflux.  Pain could be secondary to reflux, peptic ulcer disease, dyspepsia, or gallbladder pathology. Basic labwork obtained and largely reassuring. On recheck, patient continues to endorse pain. Patient was redosed  pain medication as well as Protonix. Discuss with patient supportive care and follow-up with GI versus staying for right upper quadrant ultrasound. Patient has elected to stay for further imaging. Will order and sign out to oncoming physician.    Shon Batonourtney F Claudeen Leason, MD 08/24/14 0600

## 2014-08-27 ENCOUNTER — Ambulatory Visit (INDEPENDENT_AMBULATORY_CARE_PROVIDER_SITE_OTHER): Payer: Self-pay | Admitting: Gastroenterology

## 2014-08-27 ENCOUNTER — Encounter: Payer: Self-pay | Admitting: Gastroenterology

## 2014-08-27 ENCOUNTER — Other Ambulatory Visit: Payer: Self-pay

## 2014-08-27 VITALS — BP 119/79 | HR 79 | Temp 97.9°F | Ht 64.0 in | Wt 165.0 lb

## 2014-08-27 DIAGNOSIS — R1011 Right upper quadrant pain: Secondary | ICD-10-CM

## 2014-08-27 LAB — PANCREATIC ELASTASE, FECAL: Pancreatic Elastase-1, Stool: >500 mcg/g

## 2014-08-27 NOTE — Progress Notes (Signed)
Referring Provider: No ref. provider found Primary Care Physician:  No PCP Per Patient  Primary GI: Dr. Darrick PennaFields   Chief Complaint  Patient presents with  . Follow-up    HPI:   Kara Stewart is a 39 y.o. female presenting today with a history of gastritis, esophagitis, dysphagia, diarrhea. Just seen 08/07/14 and advised to complete fecal elastase. Fecal elastase was normal. With history of alcohol abuse and alcoholic hepatitis not a candidate for Viberzi. Was still complaining of dysphagia at last visit and BPE was recommended. This has not been completed yet. Seen in ED on 5/20 with epigastric and RUQ pain. US abdomen with diffuse fatty liver otherwise normal. AST elevated at 73.   Still having severe pain. States she was given 2 doses of Morphine while in the ED without resolution of pain. Notes RUQ pain, acute onset. Chronic right-sided abdominal pain. Moreso in RUQ. Sharp pain, stabbing in RUQ. Acute onset of nausea, vomiting, abdominal pain on 5/20. Felt hot/cold. Stomach was bloated at that time. Hurt even while taking in ice chips in ED. HIDA in 2014 with EF 47.2%.   Can't tolerate any milk, causes diarrhea. Had to take Zofran this morning because still feeling nauseated. Sticking with lukewarm liquids now. Can't get comfortable. Laying flat back hurts worse. If lays on right side has a small amount of relief but laying on left side worsens. Pain exacerbated by eating.   Diarrhea 4 times this morning. No hematochezia. Looks like pepper in her stool.    Past Medical History  Diagnosis Date  . SVT (supraventricular tachycardia)   . Hiatal hernia   . Anxiety   . Acid reflux   . Dyspepsia 2004    Dx w/ PUD but no EGD, [place], [place]   . Hypertriglyceridemia   . Hypothyroid   . Iritis     frequent  . Hypertension   . Asthma   . Panic attacks   . Thyroid nodule   . RLQ abdominal pain 01/11/2014  . Vaginal discharge 01/11/2014  . Trichomonal vaginitis 01/11/2014  .  Weight gain 01/11/2014  . Irregular periods 01/11/2014  . 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 Alcohol abuse 07/21/2012    Past Surgical History  Procedure Laterality Date  . Esophagogastroduodenoscopy  11/06/2011    [name]: MILD Esophagitis/PATENT ESOPHAGEAL Stricture/  Moderate gastritis. Bx no.hpylori or celiac, +gastritis  . Esophagogastroduodenoscopy (egd) with propofol N/A 03/20/2014    [name]: 1. Mild esophagitis & distal esophagela stricture. 2. small hiatal hernia 3. moderate non-erosive gastritis and mild duodenits  . Savory dilation N/A 03/20/2014    Procedure: SAVORY DILATION;  Surgeon: West BaliSandi L Fields, MD;  Location: [facility];  Service: Endoscopy;  Laterality: N/A;  dilated with # 12.8, 14,15,16    Current Outpatient Prescriptions  Medication Sig Dispense Refill  . cetirizine (ZYRTEC) 10 MG tablet Take 10 mg by mouth daily.    Marland Kitchen. dexlansoprazole (DEXILANT) 60 MG capsule 1 PO EVERY MORNING WITH BREAKFAST. 30 capsule 11  . metoprolol tartrate (LOPRESSOR) 25 MG tablet Take 25 mg by mouth daily as needed (for palpitations).    . ondansetron (ZOFRAN) 4 MG tablet Take 1 tablet (4 mg total) by mouth every 8 (eight) hours as needed for nausea or vomiting. 20 tablet 0  . oxyCODONE-acetaminophen (PERCOCET/ROXICET) 5-325 MG per tablet Take 1 tablet by mouth every 6 (six) hours as needed for severe pain. 10 tablet 0  . Polyvinyl Alcohol-Povidone (REFRESH OP) Place 2 drops into both eyes as needed (dry  eyes).    . prednisoLONE acetate (PRED FORTE) 1 % ophthalmic suspension 1 drop 4 (four) times daily.    Marland Kitchen dicyclomine (BENTYL) 10 MG capsule Take 1 capsule (10 mg total) by mouth 4 (four) times daily -  before meals and at bedtime. (Patient not taking: Reported on 08/27/2014) 120 capsule 1  . guaifenesin (HUMIBID E) 400 MG TABS tablet Take 400 mg by mouth daily.    . Iodine, Kelp, (KELP PO) Take by mouth. Takes 4 drops daily.     No current facility-administered medications for this visit.    Allergies as  of 08/27/2014 - Review Complete 08/27/2014  Allergen Reaction Noted  . Penicillins Anaphylaxis 10/18/2011  . Lidocaine Other (See Comments) 06/22/2014    Family History  Problem Relation Age of Onset  . Stomach cancer Paternal Grandfather   . Cancer Paternal Grandfather     throat and esophagus  . Breast cancer Maternal Grandmother   . Cancer Maternal Grandmother     skin  . Anxiety disorder Maternal Grandmother   . Hypertension Mother   . Other Mother     fatty liver  . Hyperlipidemia Mother   . Other Father     varicose veins; stomach issues; hernia  . Hypertension Father   . Hyperlipidemia Father   . Cancer Father     prostate  . Arthritis Father     rheumatoid  . Thyroid disease Sister   . Other Brother     hernia  . Diabetes Maternal Grandfather   . Other Paternal Grandmother     hernia  . COPD Paternal Grandmother     History   Social History  . Marital Status: Married    Spouse Name: N/A  . Number of Children: 0  . 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 Smoking status: Never Smoker   . Smokeless tobacco: Never Used  . Alcohol Use: 0.0 oz/week    0 Standard drinks or equivalent per week     Comment: 3-4 times a week, typically 4 drinks per occasion.  . Drug Use: No  . Sexual Activity: Yes    Birth Control/ Protection: None     Comment: depo   Other Topics Concern  . None   Social History Narrative   Lives w/ grandfather or parents or family member (moved from [place] 1 month)   Previous MD: Phill Mutter, NP ([place], [place])          Review of Systems: As mentioned in HPI  Physical Exam: BP 119/79 mmHg  Pulse 79  Temp(Src) 97.9 F (36.6 C)  Ht  (1.626 m)  Wt 165 lb (74.844 kg)  BMI 28.31 kg/m2  LMP 08/12/2014 General:   Alert and oriented. No distress noted. Pleasant and cooperative.  Head:  Normocephalic and atraumatic. Eyes:  Conjuctiva clear without scleral  icterus. Mouth:  Oral mucosa pink and moist. Good dentition. No lesions. Abdomen:  +BS, soft, TTP RUQ and non-distended. No rebound or guarding. No HSM or masses noted. Msk:  Symmetrical without gross deformities. Normal posture. Extremities:  Without edema. Neurologic:  Alert and  oriented x4;  grossly normal neurologically. Skin:  Intact without significant lesions or rashes. Psych:  Alert and cooperative. Normal mood and affect.

## 2014-08-27 NOTE — Assessment & Plan Note (Signed)
39 year old female with acute on chronic abdominal pain, located RUQ with eating/drinking as exacerbating factors. With recent EGD on file and gallbladder remaining in situ, proceed with HIDA to assess for biliary dyskinesia. Chronic diarrhea likely r/t IBS, lactose intolerance, ETOH use. Fecal elastase normal. HIDA now, with further recommendations to follow. If persistent dysphagia, proceed with BPE as previously recommended.

## 2014-08-27 NOTE — Patient Instructions (Signed)
You have been scheduled for a HIDA scan. We will let you know the next step after this is reviewed.

## 2014-08-28 NOTE — Progress Notes (Signed)
No pcp per patient 

## 2014-08-29 ENCOUNTER — Telehealth: Payer: Self-pay

## 2014-08-29 ENCOUNTER — Encounter (HOSPITAL_COMMUNITY)
Admission: RE | Admit: 2014-08-29 | Discharge: 2014-08-29 | Disposition: A | Discharge status: Home / Self Care | Payer: Self-pay | Source: Ambulatory Visit | Attending: Gastroenterology | Admitting: Gastroenterology

## 2014-08-29 ENCOUNTER — Encounter (HOSPITAL_COMMUNITY): Payer: Self-pay

## 2014-08-29 DIAGNOSIS — R1011 Right upper quadrant pain: Secondary | ICD-10-CM | POA: Insufficient documentation

## 2014-08-29 MED ORDER — TECHNETIUM TC 99M MEBROFENIN IV KIT
5.0000 | PACK | Freq: Once | INTRAVENOUS | Status: AC | PRN
  Administered 2014-08-29: 5 via INTRAVENOUS

## 2014-08-29 MED ORDER — SINCALIDE 5 MCG IJ SOLR
INTRAMUSCULAR | Status: AC
  Administered 2014-08-29: 1.5 ug via INTRAVENOUS
  Filled 2014-08-29: qty 5

## 2014-08-29 MED ORDER — STERILE WATER FOR INJECTION IJ SOLN
INTRAMUSCULAR | Status: AC
  Administered 2014-08-29: 1.5 mL via INTRAVENOUS
  Filled 2014-08-29: qty 10

## 2014-08-29 NOTE — Telephone Encounter (Signed)
Pt called and states that she was told by radiology that her hida results would be given to her today. I told pt that our office gets results out between 5-10 business days. Told her I would relay message to Gerrit HallsAnna Sams NP

## 2014-08-30 ENCOUNTER — Ambulatory Visit: Payer: Self-pay | Admitting: Nurse Practitioner

## 2014-08-30 NOTE — Progress Notes (Signed)
Quick Note:  EF is normal at 97%, but she did have pain with CCK. At this point, I would just refer for a discussion with Dr. Lovell SheehanJenkins regarding risks/benefits of elective cholecystectomy. It must be cautioned this MAY not help her symptoms. ______

## 2014-08-30 NOTE — Progress Notes (Signed)
Quick Note:  LMOM to call. ______ 

## 2014-08-30 NOTE — Progress Notes (Signed)
Quick Note:  PT is aware of results. OK to refer to dr. Lovell SheehanJenkins. ______

## 2014-08-30 NOTE — Telephone Encounter (Signed)
Routing to DS. This is a [name] pt. 

## 2014-08-30 NOTE — Telephone Encounter (Signed)
LMOM to call. See result note.

## 2014-08-30 NOTE — Telephone Encounter (Signed)
Please see result note 

## 2014-08-31 ENCOUNTER — Other Ambulatory Visit: Payer: Self-pay

## 2014-08-31 DIAGNOSIS — R109 Unspecified abdominal pain: Secondary | ICD-10-CM

## 2014-09-11 ENCOUNTER — Other Ambulatory Visit: Payer: Self-pay

## 2014-09-11 ENCOUNTER — Telehealth: Payer: Self-pay

## 2014-09-11 DIAGNOSIS — R1314 Dysphagia, pharyngoesophageal phase: Secondary | ICD-10-CM

## 2014-09-11 NOTE — Telephone Encounter (Signed)
Called patient to inform her of her appointment for the BPE on 09/14/14 @ 8:45. I LMOM for her to call back.

## 2014-09-11 NOTE — Telephone Encounter (Signed)
Pt is aware of appointment date and time for BPE

## 2014-09-14 ENCOUNTER — Ambulatory Visit (HOSPITAL_COMMUNITY)
Admission: RE | Admit: 2014-09-14 | Discharge: 2014-09-14 | Disposition: A | Discharge status: Home / Self Care | Payer: Self-pay | Source: Ambulatory Visit | Attending: Nurse Practitioner | Admitting: Nurse Practitioner

## 2014-09-14 ENCOUNTER — Telehealth: Payer: Self-pay | Admitting: Urgent Care

## 2014-09-14 DIAGNOSIS — R1314 Dysphagia, pharyngoesophageal phase: Secondary | ICD-10-CM | POA: Insufficient documentation

## 2014-09-14 DIAGNOSIS — J45909 Unspecified asthma, uncomplicated: Secondary | ICD-10-CM | POA: Insufficient documentation

## 2014-09-14 DIAGNOSIS — I10 Essential (primary) hypertension: Secondary | ICD-10-CM | POA: Insufficient documentation

## 2014-09-14 NOTE — Telephone Encounter (Signed)
LVM for pt to call and resch NP referral from [facility]

## 2014-09-20 ENCOUNTER — Ambulatory Visit: Payer: Self-pay | Admitting: Surgery

## 2014-09-26 ENCOUNTER — Encounter: Payer: Self-pay | Admitting: Gastroenterology

## 2014-10-09 ENCOUNTER — Encounter: Payer: Self-pay | Admitting: Gastroenterology

## 2014-10-19 ENCOUNTER — Ambulatory Visit: Payer: Self-pay | Admitting: Gastroenterology

## 2014-11-04 ENCOUNTER — Encounter (HOSPITAL_COMMUNITY): Payer: Self-pay | Admitting: Emergency Medicine

## 2014-11-04 ENCOUNTER — Emergency Department (HOSPITAL_COMMUNITY)
Admission: EM | Admit: 2014-11-04 | Discharge: 2014-11-04 | Disposition: A | Discharge status: Home / Self Care | Payer: Self-pay | Attending: Emergency Medicine | Admitting: Emergency Medicine

## 2014-11-04 ENCOUNTER — Emergency Department (HOSPITAL_COMMUNITY): Payer: Self-pay

## 2014-11-04 DIAGNOSIS — Z8639 Personal history of other endocrine, nutritional and metabolic disease: Secondary | ICD-10-CM | POA: Insufficient documentation

## 2014-11-04 DIAGNOSIS — K219 Gastro-esophageal reflux disease without esophagitis: Secondary | ICD-10-CM | POA: Insufficient documentation

## 2014-11-04 DIAGNOSIS — Z3202 Encounter for pregnancy test, result negative: Secondary | ICD-10-CM | POA: Insufficient documentation

## 2014-11-04 DIAGNOSIS — Z8742 Personal history of other diseases of the female genital tract: Secondary | ICD-10-CM | POA: Insufficient documentation

## 2014-11-04 DIAGNOSIS — I10 Essential (primary) hypertension: Secondary | ICD-10-CM | POA: Insufficient documentation

## 2014-11-04 DIAGNOSIS — Z880 Allergy status to penicillin: Secondary | ICD-10-CM | POA: Insufficient documentation

## 2014-11-04 DIAGNOSIS — J45909 Unspecified asthma, uncomplicated: Secondary | ICD-10-CM | POA: Insufficient documentation

## 2014-11-04 DIAGNOSIS — Z79899 Other long term (current) drug therapy: Secondary | ICD-10-CM | POA: Insufficient documentation

## 2014-11-04 DIAGNOSIS — F419 Anxiety disorder, unspecified: Secondary | ICD-10-CM | POA: Insufficient documentation

## 2014-11-04 DIAGNOSIS — R1011 Right upper quadrant pain: Secondary | ICD-10-CM

## 2014-11-04 DIAGNOSIS — K760 Fatty (change of) liver, not elsewhere classified: Secondary | ICD-10-CM | POA: Insufficient documentation

## 2014-11-04 LAB — COMPREHENSIVE METABOLIC PANEL
ALT: 51 U/L (ref 14–54)
AST: 102 U/L — ABNORMAL HIGH (ref 15–41)
Albumin: 3.9 g/dL (ref 3.5–5.0)
Alkaline Phosphatase: 67 U/L (ref 38–126)
Anion gap: 14 (ref 5–15)
BILIRUBIN TOTAL: 1.9 mg/dL — AB (ref 0.3–1.2)
BUN: 7 mg/dL (ref 6–20)
CALCIUM: 8.6 mg/dL — AB (ref 8.9–10.3)
CO2: 21 mmol/L — ABNORMAL LOW (ref 22–32)
CREATININE: 0.59 mg/dL (ref 0.44–1.00)
Chloride: 105 mmol/L (ref 101–111)
Glucose, Bld: 96 mg/dL (ref 65–99)
Potassium: 3.3 mmol/L — ABNORMAL LOW (ref 3.5–5.1)
Sodium: 140 mmol/L (ref 135–145)
Total Protein: 7 g/dL (ref 6.5–8.1)

## 2014-11-04 LAB — CBC WITH DIFFERENTIAL/PLATELET
Basophils Absolute: 0 10*3/uL (ref 0.0–0.1)
Basophils Relative: 1 % (ref 0–1)
EOS ABS: 0.1 10*3/uL (ref 0.0–0.7)
Eosinophils Relative: 1 % (ref 0–5)
HEMATOCRIT: 38.7 % (ref 36.0–46.0)
Hemoglobin: 14.1 g/dL (ref 12.0–15.0)
Lymphocytes Relative: 22 % (ref 12–46)
Lymphs Abs: 1.2 10*3/uL (ref 0.7–4.0)
MCH: 37.3 pg — ABNORMAL HIGH (ref 26.0–34.0)
MCHC: 36.4 g/dL — ABNORMAL HIGH (ref 30.0–36.0)
MCV: 102.4 fL — ABNORMAL HIGH (ref 78.0–100.0)
MONO ABS: 0.3 10*3/uL (ref 0.1–1.0)
MONOS PCT: 6 % (ref 3–12)
Neutro Abs: 4 10*3/uL (ref 1.7–7.7)
Neutrophils Relative %: 70 % (ref 43–77)
Platelets: 178 10*3/uL (ref 150–400)
RBC: 3.78 MIL/uL — AB (ref 3.87–5.11)
RDW: 12.4 % (ref 11.5–15.5)
WBC: 5.7 10*3/uL (ref 4.0–10.5)

## 2014-11-04 LAB — URINE MICROSCOPIC-ADD ON

## 2014-11-04 LAB — URINALYSIS, ROUTINE W REFLEX MICROSCOPIC
Glucose, UA: NEGATIVE mg/dL
Hgb urine dipstick: NEGATIVE
Ketones, ur: 15 mg/dL — AB
Leukocytes, UA: NEGATIVE
NITRITE: NEGATIVE
PH: 7.5 (ref 5.0–8.0)
Protein, ur: 30 mg/dL — AB
SPECIFIC GRAVITY, URINE: 1.015 (ref 1.005–1.030)
Urobilinogen, UA: 1 mg/dL (ref 0.0–1.0)

## 2014-11-04 LAB — LACTIC ACID, PLASMA
Lactic Acid, Venous: 1.3 mmol/L (ref 0.5–2.0)
Lactic Acid, Venous: 0.8 mmol/L (ref 0.5–2.0)

## 2014-11-04 LAB — TROPONIN I: Troponin I: <0.03 ng/mL (ref <0.031)

## 2014-11-04 LAB — LIPASE, BLOOD: LIPASE: 26 U/L (ref 22–51)

## 2014-11-04 LAB — POC URINE PREG, ED: PREG TEST UR: NEGATIVE

## 2014-11-04 MED ORDER — IOHEXOL 300 MG/ML  SOLN
25.0000 mL | Freq: Once | INTRAMUSCULAR | Status: AC | PRN
  Administered 2014-11-04: 25 mL via ORAL

## 2014-11-04 MED ORDER — SODIUM CHLORIDE 0.9 % IV BOLUS (SEPSIS)
1000.0000 mL | Freq: Once | INTRAVENOUS | Status: AC
  Administered 2014-11-04: 1000 mL via INTRAVENOUS

## 2014-11-04 MED ORDER — PROCHLORPERAZINE EDISYLATE 5 MG/ML IJ SOLN
10.0000 mg | Freq: Once | INTRAMUSCULAR | Status: AC
Start: 2014-11-04 — End: 2014-11-04
  Administered 2014-11-04: 10 mg via INTRAVENOUS
  Filled 2014-11-04: qty 2

## 2014-11-04 MED ORDER — FENTANYL CITRATE (PF) 100 MCG/2ML IJ SOLN
50.0000 ug | Freq: Once | INTRAMUSCULAR | Status: AC
  Administered 2014-11-04: 50 ug via INTRAVENOUS
  Filled 2014-11-04: qty 2

## 2014-11-04 MED ORDER — PANTOPRAZOLE SODIUM 40 MG IV SOLR
40.0000 mg | Freq: Once | INTRAVENOUS | Status: AC
  Administered 2014-11-04: 40 mg via INTRAVENOUS
  Filled 2014-11-04: qty 40

## 2014-11-04 MED ORDER — IOHEXOL 300 MG/ML  SOLN
100.0000 mL | Freq: Once | INTRAMUSCULAR | Status: AC | PRN
  Administered 2014-11-04: 100 mL via INTRAVENOUS

## 2014-11-04 MED ORDER — METOCLOPRAMIDE HCL 5 MG/ML IJ SOLN
10.0000 mg | Freq: Once | INTRAMUSCULAR | Status: AC
  Administered 2014-11-04: 10 mg via INTRAVENOUS
  Filled 2014-11-04: qty 2

## 2014-11-04 MED ORDER — PROMETHAZINE HCL 25 MG RE SUPP: 25.0000 mg | Freq: Four times a day (QID) | RECTAL | Status: DC | PRN

## 2014-11-04 MED ORDER — HYDROCODONE-ACETAMINOPHEN 5-325 MG PO TABS: 1.0000 | ORAL_TABLET | ORAL | Status: DC | PRN

## 2014-11-04 NOTE — ED Notes (Signed)
Patient given ice chips per RN approval. 

## 2014-11-04 NOTE — ED Notes (Signed)
Pt c/o mid epigastric and ruq abd pain since 0600 with n/v. Denies constipation/diarrhea. States she is awaiting appointment with surgeon d/t gallstones.

## 2014-11-04 NOTE — ED Provider Notes (Signed)
CSN: [id number]     Arrival date & time 11/04/14  4098 History   First MD Initiated Contact with Patient 11/04/14 0813     Chief Complaint  Patient presents with  . Abdominal Pain     (Consider location/radiation/quality/duration/timing/severity/associated sxs/prior Treatment) HPI Comments: CC: RUQ Abd Pain  GI: Dr Darrick Penna PCP None  Pt is a 39 y/o female with hx of hiatal hernia, anxiety, gastritis, esophagitis,  And hx of ETOH abuse in the past. Pt presents today with c/o increasing RUQ abd pain. She reports that she has had RUQ pain since 6:00AM with n/v. She has been seen by [facility]. She was told she should see a surgeon for opinion concerning the RUQ pain. There was confusion in the referal and paper work and this has not been completed yet. She describes the pain as sharp and stabbing. No constipation or diarrhea or high fever related to these episodes.Pain is worse when lying flat. Some improvement in the pain if in a fetal position.  The history is provided by the patient.    Past Medical History  Diagnosis Date  . SVT (supraventricular tachycardia)   . Hiatal hernia   . Anxiety   . Acid reflux   . Dyspepsia 2004    Dx w/ PUD but no EGD, [place], [place]   . Hypertriglyceridemia   . Hypothyroid   . Iritis     frequent  . Hypertension   . Asthma   . Panic attacks   . Thyroid nodule   . RLQ abdominal pain 01/11/2014  . Vaginal discharge 01/11/2014  . Trichomonal vaginitis 01/11/2014  . Weight gain 01/11/2014  . Irregular periods 01/11/2014  . 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 Alcohol abuse 07/21/2012   Past Surgical History  Procedure Laterality Date  . Esophagogastroduodenoscopy  11/06/2011    [name]: MILD Esophagitis/PATENT ESOPHAGEAL Stricture/  Moderate gastritis. Bx no.hpylori or celiac, +gastritis  . Esophagogastroduodenoscopy (egd) with propofol N/A 03/20/2014    [name]: 1. Mild esophagitis & distal esophagela stricture. 2. small hiatal hernia 3. moderate  non-erosive gastritis and mild duodenits  . Savory dilation N/A 03/20/2014    Procedure: SAVORY DILATION;  Surgeon: West Bali, MD;  Location: [facility];  Service: Endoscopy;  Laterality: N/A;  dilated with # 12.8, 14,15,16   Family History  Problem Relation Age of Onset  . Stomach cancer Paternal Grandfather   . Cancer Paternal Grandfather     throat and esophagus  . Breast cancer Maternal Grandmother   . Cancer Maternal Grandmother     skin  . Anxiety disorder Maternal Grandmother   . Hypertension Mother   . Other Mother     fatty liver  . Hyperlipidemia Mother   . Other Father     varicose veins; stomach issues; hernia  . Hypertension Father   . Hyperlipidemia Father   . Cancer Father     prostate  . Arthritis Father     rheumatoid  . Thyroid disease Sister   . Other Brother     hernia  . Diabetes Maternal Grandfather   . Other Paternal Grandmother     hernia  . COPD Paternal Grandmother    History  Substance Use Topics  . Smoking status: Never Smoker   . Smokeless tobacco: Never Used  . Alcohol Use: 0.0 oz/week    0 Standard drinks or equivalent per week     Comment: 3-4 times a week, typically 4 drinks per occasion.    OB History  Gravida Para Term Preterm AB TAB SAB Ectopic Multiple Living   Review of Systems  Constitutional: Negative for fever and diaphoresis.  Gastrointestinal: Positive for nausea, vomiting and abdominal pain. Negative for diarrhea and constipation.  Psychiatric/Behavioral: The patient is nervous/anxious.   All other systems reviewed and are negative.     Allergies  Penicillins and Lidocaine  Home Medications   Prior to Admission medications   Medication Sig Start Date End Date Taking? Authorizing Provider  dexlansoprazole (DEXILANT) 60 MG capsule 1 PO EVERY MORNING WITH BREAKFAST. 03/20/14  Yes West Bali, MD  metoprolol tartrate (LOPRESSOR) 25 MG tablet Take 25 mg by mouth daily as needed (for  palpitations). 08/13/12  Yes Renae Fickle, MD   BP 103/70 mmHg  Pulse 83  Temp(Src) 98.4 F (36.9 C) (Oral)  Resp 16  Ht  (1.626 m)  Wt 153 lb (69.4 kg)  BMI 26.25 kg/m2  SpO2 97%  LMP 10/09/2014 Physical Exam  Constitutional: She is oriented to person, place, and time. She appears well-developed and well-nourished.  Non-toxic appearance.  HENT:  Head: Normocephalic.  Right Ear: Tympanic membrane and external ear normal.  Left Ear: Tympanic membrane and external ear normal.  Eyes: EOM and lids are normal. Pupils are equal, round, and reactive to light.  Neck: Normal range of motion. Neck supple. Carotid bruit is not present.  Cardiovascular: Normal rate, regular rhythm, normal heart sounds, intact distal pulses and normal pulses.   Pulmonary/Chest: Breath sounds normal. No respiratory distress.  Abdominal: Soft. Bowel sounds are normal. She exhibits no shifting dullness, no distension, no ascites and no mass. There is tenderness in the right upper quadrant, epigastric area and periumbilical area. There is guarding. There is no CVA tenderness.  Musculoskeletal: Normal range of motion. She exhibits no edema.  Lymphadenopathy:       Head (right side): No submandibular adenopathy present.       Head (left side): No submandibular adenopathy present.    She has no cervical adenopathy.  Neurological: She is alert and oriented to person, place, and time. She has normal strength. No cranial nerve deficit or sensory deficit.  Skin: Skin is warm and dry.  Psychiatric: She has a normal mood and affect. Her speech is normal.  Nursing note and vitals reviewed.   ED Course  Procedures (including critical care time) Labs Review Labs Reviewed  COMPREHENSIVE METABOLIC PANEL - Abnormal; Notable for the following:    Potassium 3.3 (*)    CO2 21 (*)    Calcium 8.6 (*)    AST 102 (*)    Total Bilirubin 1.9 (*)    All other components within normal limits  CBC WITH DIFFERENTIAL/PLATELET  - Abnormal; Notable for the following:    RBC 3.78 (*)    MCV 102.4 (*)    MCH 37.3 (*)    MCHC 36.4 (*)    All other components within normal limits  URINALYSIS, ROUTINE W REFLEX MICROSCOPIC (NOT AT [facility]) - Abnormal; Notable for the following:    Bilirubin Urine SMALL (*)    Ketones, ur 15 (*)    Protein, ur 30 (*)    All other components within normal limits  URINE MICROSCOPIC-ADD ON - Abnormal; Notable for the following:    Squamous Epithelial / LPF FEW (*)    All other components within normal limits  LIPASE, BLOOD  TROPONIN I  LACTIC ACID, PLASMA  LACTIC  ACID, PLASMA  POC URINE PREG, ED    Imaging Review Ct Abdomen Pelvis W Contrast  11/04/2014   CLINICAL DATA:  Epigastric and right upper quadrant abdominal pain beginning this morning. Nausea and vomiting. Cholelithiasis. Gastroesophageal reflux disease. Elevated liver function tests.  EXAM: CT ABDOMEN AND PELVIS WITH CONTRAST  TECHNIQUE: Multidetector CT imaging of the abdomen and pelvis was performed using the standard protocol following bolus administration of intravenous contrast.  CONTRAST:  OMNIPAQUE IOHEXOL 300 MG/ML SOLN, 25mL OMNIPAQUE IOHEXOL 300 MG/ML SOLN  COMPARISON:  None.  FINDINGS: Lower Chest: No acute findings.  Hepatobiliary: Severe hepatic steatosis is demonstrated. No liver masses visualized. Gallbladder is unremarkable. No evidence of biliary ductal dilatation.  Pancreas: No mass, inflammatory changes, or other significant abnormality identified.  Spleen: Mild splenomegaly, with spleen measuring approximately 13 cm length. No splenic lesions visualized.  Adrenals:  No masses identified.  Kidneys/Urinary Tract:  No evidence of masses or hydronephrosis.  Stomach/Bowel/Peritoneum: No evidence of wall thickening, mass, or obstruction. Normal appendix visualized.  Vascular/Lymphatic: No pathologically enlarged lymph nodes identified. No abdominal aortic aneurysm or other significant retroperitoneal abnormality  demonstrated.  Reproductive:  No mass or other significant abnormality identified.  Other:  None.  Musculoskeletal:  No suspicious bone lesions identified.  IMPRESSION: No acute findings within the abdomen or pelvis.  Severe hepatic steatosis.  Mild splenomegaly.   Electronically Signed   By: Myles Rosenthal M.D.   On: 11/04/2014 13:58     EKG Interpretation None      MDM Vital signs reviewed. CT scam reveals severe hepatic steatosis and mild splenomegaly, o/w WNL. Lactic acid and urine preg negative. Lipase and Troponin negative for acute problem. CBC non-acute. Total bili elevated at 1.9. I have ask pt to see GI concerning the elevated total bili and the hepatic steatosis.  Rx for promethazine and norco given to the patient. Pt acknowledges d/c instructions.   Final diagnoses:  RUQ abdominal pain    **I have reviewed nursing notes, vital signs, and all appropriate lab and imaging results for this patient.*    Ivery Quale, PA-C 11/06/14 [address], PA-C 11/06/14 2022  Gerhard Munch, MD 11/07/14 [phone]

## 2014-11-04 NOTE — Discharge Instructions (Signed)
Your CT scan shows increased fatty liver disease, and inflammation. The remainder of your CT is negative for any acute changes or problems. Your vital signs are within normal limits. Please see your specialist as prescribed by your primary physician. May use promethazine for nausea, use Tylenol or ibuprofen for mild pain, may use Norco for more severe pain. Promethazine and Norco may cause drowsiness, please use with caution.

## 2014-11-05 ENCOUNTER — Encounter: Payer: Self-pay | Admitting: Adult Health

## 2014-11-05 ENCOUNTER — Ambulatory Visit (INDEPENDENT_AMBULATORY_CARE_PROVIDER_SITE_OTHER): Payer: Self-pay | Admitting: Adult Health

## 2014-11-05 VITALS — BP 116/70 | HR 72 | Wt 158.0 lb

## 2014-11-05 DIAGNOSIS — N926 Irregular menstruation, unspecified: Secondary | ICD-10-CM

## 2014-11-05 NOTE — Progress Notes (Signed)
Subjective:     Patient ID: Kara Stewart, female   DOB: [birth year], 39 y.o.   MRN: [id number]  HPI Jayonna is a 39 year old white female, married in complaining of retained tampon, has had 2 periods this month and they are irregular  Review of Systems  Periods irregular, ?retained tampon, all other systems negative Reviewed past medical,surgical, social and family history. Reviewed medications and allergies.     Objective:   Physical Exam BP 116/70 mmHg  Pulse 72  Wt 158 lb (71.668 kg)  LMP 11/04/2014   Skin warm and dry.Pelvic: external genitalia is normal in appearance no lesions, vagina:period like blood, no retained tampon,urethra has no lesions or masses noted, cervix:smooth, uterus: normal size, shape and contour, non tender, no masses felt, adnexa: no masses or tenderness noted. Bladder is non tender and no masses felt.   Assessment:     Irregular periods    Plan:     Return in 3 months for pap and physical

## 2014-11-05 NOTE — Patient Instructions (Signed)
Pap and physical in 3 months 

## 2014-11-15 ENCOUNTER — Telehealth: Payer: Self-pay | Admitting: Gastroenterology

## 2014-11-15 ENCOUNTER — Encounter (INDEPENDENT_AMBULATORY_CARE_PROVIDER_SITE_OTHER): Payer: Self-pay | Admitting: Gastroenterology

## 2014-11-15 ENCOUNTER — Encounter: Payer: Self-pay | Admitting: Gastroenterology

## 2014-11-15 NOTE — Telephone Encounter (Signed)
PATIENT WAS A NO SHOW AND LETTER SENT  °

## 2014-11-15 NOTE — Telephone Encounter (Signed)
REVIEWED-NO ADDITIONAL RECOMMENDATIONS. 

## 2014-11-15 NOTE — Progress Notes (Signed)
   Subjective:    Patient ID: Kara Stewart, female    DOB: 08-08-1975, 39 y.o.   MRN: [id number]  No PCP Per Patient   HPI  Past Medical History  Diagnosis Date  . SVT (supraventricular tachycardia)   . Hiatal hernia   . Anxiety   . Acid reflux   . Dyspepsia 2004    Dx w/ PUD but no EGD, [place], [place]   . Hypertriglyceridemia   . Hypothyroid   . Iritis     frequent  . Hypertension   . Asthma   . Panic attacks   . Thyroid nodule   . RLQ abdominal pain 01/11/2014  . Vaginal discharge 01/11/2014  . Trichomonal vaginitis 01/11/2014  . Weight gain 01/11/2014  . Irregular periods 01/11/2014  . 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 Alcohol abuse 07/21/2012    Past Surgical History  Procedure Laterality Date  . Esophagogastroduodenoscopy  11/06/2011    [name]: MILD Esophagitis/PATENT ESOPHAGEAL Stricture/  Moderate gastritis. Bx no.hpylori or celiac, +gastritis  . Esophagogastroduodenoscopy (egd) with propofol N/A 03/20/2014    [name]: 1. Mild esophagitis & distal esophagela stricture. 2. small hiatal hernia 3. moderate non-erosive gastritis and mild duodenits  . Savory dilation N/A 03/20/2014    Procedure: SAVORY DILATION;  Surgeon: West Bali, MD;  Location: [facility];  Service: Endoscopy;  Laterality: N/A;  dilated with # 12.8, 14,15,16   Allergies  Allergen Reactions  . Penicillins Anaphylaxis  . Lidocaine Other (See Comments)    hallucinations     Review of Systems     Objective:   Physical Exam        Assessment & Plan:

## 2014-11-15 NOTE — Progress Notes (Signed)
REVIEWED-NO ADDITIONAL RECOMMENDATIONS. 

## 2014-11-20 ENCOUNTER — Encounter: Payer: Self-pay | Admitting: Gastroenterology

## 2014-11-20 ENCOUNTER — Ambulatory Visit (INDEPENDENT_AMBULATORY_CARE_PROVIDER_SITE_OTHER): Payer: Self-pay | Admitting: Gastroenterology

## 2014-11-20 VITALS — BP 119/81 | HR 89 | Temp 97.5°F | Ht 64.5 in | Wt 159.8 lb

## 2014-11-20 DIAGNOSIS — K760 Fatty (change of) liver, not elsewhere classified: Secondary | ICD-10-CM

## 2014-11-20 DIAGNOSIS — K219 Gastro-esophageal reflux disease without esophagitis: Secondary | ICD-10-CM

## 2014-11-20 DIAGNOSIS — R1011 Right upper quadrant pain: Secondary | ICD-10-CM

## 2014-11-20 DIAGNOSIS — F1010 Alcohol abuse, uncomplicated: Secondary | ICD-10-CM

## 2014-11-20 NOTE — Patient Instructions (Signed)
1. Please have labs done. 2. Please feel out Dexilant patient assistance forms and return to our office.  3. You need to stop drinking ALL alcohol. Please let me know when you are ready and we can consider Librium to help prevent withdrawals. Other options are inpatient rehab or AA.

## 2014-11-20 NOTE — Progress Notes (Signed)
Primary Care Physician: No PCP Per Patient  Primary Gastroenterologist:  Jonette Eva, MD   Chief Complaint  Patient presents with  . Follow-up    HPI: Kara Stewart is a 39 y.o. female here for follow-up. Last seen in May 2016. History of gastritis, esophagitis, dysphagia, diarrhea, alcohol abuse. Fecal elastase normal. Not a candidate for Viberzi because of alcohol abuse. BPE for dysphagia showed no evidence of strictures. Abdominal ultrasound in May 2016 showed fatty liver. HIDA scan in May 2016 showed normal gallbladder ejection fraction. Patient had some abdominal cramping during [facility] infusion. Referred to [facility] to discuss possible cholecystectomy, patient had [facility] assistance. 11/04/2014, CT scan done during ER visit showed again severe hepatic steatosis, mild splenomegaly.   Currently no PCP. Has [facility] Assistance but no drug coverage. A lot of pending issues. H/O thyroid nodule and hypoactive thyroid but never put on medications. No follow up. Has not seen endocrinologist.   Trapped air/belching/bloated. If drink too fast and takes large gulp then liquid comes back up. Lot of heartburn, takes Dexilant samples only. OTC Nexium prn. Trying to stretch PPI out but feels better if takes every day. Pain all the time for the past 3 weeks. Feels harder. Not worse with meals. Eats small portions, nausea. Poor appetite. Dry heave, mucous four times pwer week. Doesn't feel nauseated with it. Mainly in the morning and wakes up.  BM usually loose, couple in morning not as bad as in the past. No melena, brbpr.    Milk Thistle daily.  Continues to drink. Pint of etoh daily. Blames on stress of taking care of grandparents. Has stopped etoh for over six months before.  Mother had fatty liver and doesn't consume etoh. No DM. Not significant overweight.     Current Outpatient Prescriptions  Medication Sig Dispense Refill  . dexlansoprazole (DEXILANT) 60 MG capsule 1 PO EVERY  MORNING WITH BREAKFAST. 30 capsule 11  . metoprolol tartrate (LOPRESSOR) 25 MG tablet Take 25 mg by mouth daily as needed (for palpitations).     No current facility-administered medications for this visit.    Allergies as of 11/20/2014 - Review Complete 11/20/2014  Allergen Reaction Noted  . Penicillins Anaphylaxis 10/18/2011  . Lidocaine Other (See Comments) 06/22/2014    ROS:  General: Negative for anorexia, weight loss, fever, chills, fatigue, weakness. ENT: Negative for hoarseness, difficulty swallowing , nasal congestion. CV: Negative for chest pain, angina, palpitations, dyspnea on exertion, peripheral edema.  Respiratory: Negative for dyspnea at rest, dyspnea on exertion, cough, sputum, wheezing.  GI: See history of present illness. GU:  Negative for dysuria, hematuria, urinary incontinence, urinary frequency, nocturnal urination.  Endo: Negative for unusual weight change.    Physical Examination:   BP 119/81 mmHg  Pulse 89  Temp(Src) 97.5 F (36.4 C)  Ht 5' 4.5" (1.638 m)  Wt 159 lb 12.8 oz (72.485 kg)  BMI 27.02 kg/m2  LMP 11/04/2014  General: Well-nourished, well-developed in no acute distress.  Eyes: No icterus. Mouth: Oropharyngeal mucosa moist and pink , no lesions erythema or exudate. Lungs: Clear to auscultation bilaterally.  Heart: Regular rate and rhythm, no murmurs rubs or gallops.  Abdomen: Bowel sounds are normal, epig/ruq tenderness, nondistended, no hepatosplenomegaly or masses, no abdominal bruits or hernia , no rebound or guarding.   Extremities: No lower extremity edema. No clubbing or deformities. Neuro: Alert and oriented x 4   Skin: Warm and dry, no jaundice.   Psych: Alert and cooperative, normal mood and  affect.  Labs:  Lab Results  Component Value Date   CREATININE 0.59 11/04/2014   BUN 7 11/04/2014   NA 140 11/04/2014   K 3.3* 11/04/2014   CL 105 11/04/2014   CO2 21* 11/04/2014   Lab Results  Component Value Date   ALT 51  11/04/2014   AST 102* 11/04/2014   ALKPHOS 67 11/04/2014   BILITOT 1.9* 11/04/2014   Lab Results  Component Value Date   LIPASE 26 11/04/2014   Lab Results  Component Value Date   WBC 5.7 11/04/2014   HGB 14.1 11/04/2014   HCT 38.7 11/04/2014   MCV 102.4* 11/04/2014   PLT 178 11/04/2014    Imaging Studies: Ct Abdomen Pelvis W Contrast  11/04/2014   CLINICAL DATA:  Epigastric and right upper quadrant abdominal pain beginning this morning. Nausea and vomiting. Cholelithiasis. Gastroesophageal reflux disease. Elevated liver function tests.  EXAM: CT ABDOMEN AND PELVIS WITH CONTRAST  TECHNIQUE: Multidetector CT imaging of the abdomen and pelvis was performed using the standard protocol following bolus administration of intravenous contrast.  CONTRAST:  OMNIPAQUE IOHEXOL 300 MG/ML SOLN, 25mL OMNIPAQUE IOHEXOL 300 MG/ML SOLN  COMPARISON:  None.  FINDINGS: Lower Chest: No acute findings.  Hepatobiliary: Severe hepatic steatosis is demonstrated. No liver masses visualized. Gallbladder is unremarkable. No evidence of biliary ductal dilatation.  Pancreas: No mass, inflammatory changes, or other significant abnormality identified.  Spleen: Mild splenomegaly, with spleen measuring approximately 13 cm length. No splenic lesions visualized.  Adrenals:  No masses identified.  Kidneys/Urinary Tract:  No evidence of masses or hydronephrosis.  Stomach/Bowel/Peritoneum: No evidence of wall thickening, mass, or obstruction. Normal appendix visualized.  Vascular/Lymphatic: No pathologically enlarged lymph nodes identified. No abdominal aortic aneurysm or other significant retroperitoneal abnormality demonstrated.  Reproductive:  No mass or other significant abnormality identified.  Other:  None.  Musculoskeletal:  No suspicious bone lesions identified.  IMPRESSION: No acute findings within the abdomen or pelvis.  Severe hepatic steatosis.  Mild splenomegaly.   Electronically Signed   By: Myles Rosenthal M.D.   On:  11/04/2014 13:58

## 2014-11-22 ENCOUNTER — Telehealth: Payer: Self-pay

## 2014-11-22 MED ORDER — SUCRALFATE 1 GM/10ML PO SUSP: 1.0000 g | Freq: Three times a day (TID) | ORAL | Status: DC

## 2014-11-22 MED ORDER — FOLIC ACID 1 MG PO TABS: 1.0000 mg | ORAL_TABLET | Freq: Every day | ORAL | Status: DC

## 2014-11-22 MED ORDER — CHLORDIAZEPOXIDE HCL 25 MG PO CAPS: ORAL_CAPSULE | ORAL | Status: DC

## 2014-11-22 MED ORDER — THIAMINE HCL 50 MG PO CAPS: 50.0000 mg | ORAL_CAPSULE | Freq: Every day | ORAL | Status: DC

## 2014-11-22 NOTE — Telephone Encounter (Signed)
I tried to call pt and her mail box is full and and could not accept any messages.  She had a question from her OV this week.

## 2014-11-22 NOTE — Telephone Encounter (Signed)
Called and informed pt. Rx for the Librium faxed to [facility]. Reviewed all of the instructions and told her if she has questions when she picks them up to please call me.

## 2014-11-22 NOTE — Telephone Encounter (Signed)
Pt returned call and said she would like to know if Tana Coast, PA can send her a prescription for Carafate. That has helped her in the past to take for a few days.  Also, she was told to call when she is ready to stop drinking and she is wanting to do so and take the Librium.  Please advise!

## 2014-11-22 NOTE — Telephone Encounter (Signed)
I can send in RX for carafate but without insurance it will probably be too expensive. RX done.  Librium  every 6 hour as needed for etoh withdrawal symptoms on day 1 of no etoh. Librium  every 6 hours as needed for etoh withdrawal symptoms on day 2-5.  Start folic acid and thiamine (vit B1).   RX for all four done. Librium printed because it is scheduled.

## 2014-11-23 ENCOUNTER — Telehealth: Payer: Self-pay

## 2014-11-23 NOTE — Telephone Encounter (Signed)
VM from [place] at [facility] requesting changing Carafate to tablets since the suspension is so expensive and the pt does not have drug coverage.

## 2014-11-23 NOTE — Telephone Encounter (Signed)
OK to change to tablet. Patient can make suspension by crush in applesauce.

## 2014-11-23 NOTE — Telephone Encounter (Signed)
Called and informed Almira Coaster at the pharmacy.

## 2014-11-26 NOTE — Assessment & Plan Note (Signed)
Does well on Dexilant but not taking regularly due to lack of insurance. Patient assistance forms provided today and samples. Discussed antireflux measures. Need to stop etoh.

## 2014-11-26 NOTE — Progress Notes (Signed)
NO PCP PER PATIENT °

## 2014-11-26 NOTE — Assessment & Plan Note (Signed)
Ongoing RUQ pain, in setting of etoh abuse. CT showed severe fatty liver and mild splenomegaly. Concerned about these significant findings in a 39 y/o individual. Discussed need to stop etoh at length with patient. She is high risk of developing cirrhosis. Will check serologies to rule out any secondary issues that could be contributing to liver disease. Until patient is managing her GERD appropriately and quits etoh, I would recommend holding off on surgical referral for GB since no definitive findings to suggest symptoms are GB related. Patient will have labs. She will let me know if/when she decides to stop drinking. I have recommended her obtaining PCP, list of [facility] providers provided. Recommend she discuss with PCP or GYN possibility of medication to help copy with her stress. Further recommendations to follow.

## 2014-11-27 ENCOUNTER — Telehealth: Payer: Self-pay

## 2014-11-27 NOTE — Telephone Encounter (Signed)
T/C from Abran Cantor, nurse at [facility], to schedule appt for pt to be seen in 3 days. Pt was admitted there on 11/24/2014 for colitis and metabolic acidosis. I scheduled her an OV with Wynne Dust, NP on 11/29/2014 at 10:30 Am. FYI to Tana Coast, PA who saw pt here in the office last.

## 2014-11-27 NOTE — Telephone Encounter (Signed)
Noted  

## 2014-11-29 ENCOUNTER — Ambulatory Visit: Payer: Self-pay | Admitting: Nurse Practitioner

## 2014-11-29 ENCOUNTER — Ambulatory Visit (INDEPENDENT_AMBULATORY_CARE_PROVIDER_SITE_OTHER): Payer: Self-pay | Admitting: Adult Health

## 2014-11-29 ENCOUNTER — Encounter: Payer: Self-pay | Admitting: Adult Health

## 2014-11-29 ENCOUNTER — Encounter: Payer: Self-pay | Admitting: Gastroenterology

## 2014-11-29 VITALS — BP 98/70 | HR 92 | Ht 64.5 in | Wt 157.0 lb

## 2014-11-29 DIAGNOSIS — Z8719 Personal history of other diseases of the digestive system: Secondary | ICD-10-CM

## 2014-11-29 DIAGNOSIS — F410 Panic disorder [episodic paroxysmal anxiety] without agoraphobia: Secondary | ICD-10-CM | POA: Insufficient documentation

## 2014-11-29 DIAGNOSIS — N83209 Unspecified ovarian cyst, unspecified side: Secondary | ICD-10-CM | POA: Insufficient documentation

## 2014-11-29 DIAGNOSIS — F419 Anxiety disorder, unspecified: Secondary | ICD-10-CM

## 2014-11-29 DIAGNOSIS — F1010 Alcohol abuse, uncomplicated: Secondary | ICD-10-CM

## 2014-11-29 DIAGNOSIS — N8320 Unspecified ovarian cysts: Secondary | ICD-10-CM

## 2014-11-29 DIAGNOSIS — N83202 Unspecified ovarian cyst, left side: Secondary | ICD-10-CM

## 2014-11-29 DIAGNOSIS — R1084 Generalized abdominal pain: Secondary | ICD-10-CM

## 2014-11-29 HISTORY — DX: Personal history of other diseases of the digestive system: Z87.19

## 2014-11-29 MED ORDER — CHLORDIAZEPOXIDE HCL 25 MG PO CAPS: 25.0000 mg | ORAL_CAPSULE | Freq: Four times a day (QID) | ORAL | Status: DC | PRN

## 2014-11-29 NOTE — Patient Instructions (Addendum)
Ovarian Cyst An ovarian cyst is a fluid-filled sac that forms on an ovary. The ovaries are small organs that produce eggs in women. Various types of cysts can form on the ovaries. Most are not cancerous. Many do not cause problems, and they often go away on their own. Some may cause symptoms and require treatment. Common types of ovarian cysts include:  Functional cysts--These cysts may occur every month during the menstrual cycle. This is normal. The cysts usually go away with the next menstrual cycle if the woman does not get pregnant. Usually, there are no symptoms with a functional cyst.  Endometrioma cysts--These cysts form from the tissue that lines the uterus. They are also called "chocolate cysts" because they become filled with blood that turns brown. This type of cyst can cause pain in the lower abdomen during intercourse and with your menstrual period.  Cystadenoma cysts--This type develops from the cells on the outside of the ovary. These cysts can get very big and cause lower abdomen pain and pain with intercourse. This type of cyst can twist on itself, cut off its blood supply, and cause severe pain. It can also easily rupture and cause a lot of pain.  Dermoid cysts--This type of cyst is sometimes found in both ovaries. These cysts may contain different kinds of body tissue, such as skin, teeth, hair, or cartilage. They usually do not cause symptoms unless they get very big.  Theca lutein cysts--These cysts occur when too much of a certain hormone (human chorionic gonadotropin) is produced and overstimulates the ovaries to produce an egg. This is most common after procedures used to assist with the conception of a baby (in vitro fertilization). CAUSES   Fertility drugs can cause a condition in which multiple large cysts are formed on the ovaries. This is called ovarian hyperstimulation syndrome.  A condition called polycystic ovary syndrome can cause hormonal imbalances that can lead to  nonfunctional ovarian cysts. SIGNS AND SYMPTOMS  Many ovarian cysts do not cause symptoms. If symptoms are present, they may include:  Pelvic pain or pressure.  Pain in the lower abdomen.  Pain during sexual intercourse.  Increasing girth (swelling) of the abdomen.  Abnormal menstrual periods.  Increasing pain with menstrual periods.  Stopping having menstrual periods without being pregnant. DIAGNOSIS  These cysts are commonly found during a routine or annual pelvic exam. Tests may be ordered to find out more about the cyst. These tests may include:  Ultrasound.  X-ray of the pelvis.  CT scan.  MRI.  Blood tests. TREATMENT  Many ovarian cysts go away on their own without treatment. Your health care provider may want to check your cyst regularly for 2-3 months to see if it changes. For women in menopause, it is particularly important to monitor a cyst closely because of the higher rate of ovarian cancer in menopausal women. When treatment is needed, it may include any of the following:  A procedure to drain the cyst (aspiration). This may be done using a long needle and ultrasound. It can also be done through a laparoscopic procedure. This involves using a thin, lighted tube with a tiny camera on the end (laparoscope) inserted through a small incision.  Surgery to remove the whole cyst. This may be done using laparoscopic surgery or an open surgery involving a larger incision in the lower abdomen.  Hormone treatment or birth control pills. These methods are sometimes used to help dissolve a cyst. HOME CARE INSTRUCTIONS   Only take over-the-counter   or prescription medicines as directed by your health care provider.  Follow up with your health care provider as directed.  Get regular pelvic exams and Pap tests. SEEK MEDICAL CARE IF:   Your periods are late, irregular, or painful, or they stop.  Your pelvic pain or abdominal pain does not go away.  Your abdomen becomes  larger or swollen.  You have pressure on your bladder or trouble emptying your bladder completely.  You have pain during sexual intercourse.  You have feelings of fullness, pressure, or discomfort in your stomach.  You lose weight for no apparent reason.  You feel generally ill.  You become constipated.  You lose your appetite.  You develop acne.  You have an increase in body and facial hair.  You are gaining weight, without changing your exercise and eating habits.  You think you are pregnant. SEEK IMMEDIATE MEDICAL CARE IF:   You have increasing abdominal pain.  You feel sick to your stomach (nauseous), and you throw up (vomit).  You develop a fever that comes on suddenly.  You have abdominal pain during a bowel movement.  Your menstrual periods become heavier than usual. MAKE SURE YOU:  Understand these instructions.  Will watch your condition.  Will get help right away if you are not doing well or get worse. Document Released: 03/23/2005 Document Revised: 03/28/2013 Document Reviewed: 11/28/2012 [facility] Patient Information 2015 [place], [place]. This information is not intended to replace advice given to you by your health care provider. Make sure you discuss any questions you have with your health care provider. Follow up in 4 weeks, will get [place] in 4 weeks Follow up with GI

## 2014-11-29 NOTE — Progress Notes (Signed)
Subjective:     Patient ID: Kara Stewart, female   DOB: September 13, 1975, 39 y.o.   MRN: [id number]  HPI Kara Stewart is a 39 year old white female, married in for having been in [place] from 8/20 to 11/28/14 for abdominal pain and colitis,and CT showed ovarian cyst and she was worried.She has had no alcohol in 1 week.She was drinking 8-12 drinks a day.She was given in past.Has history of IBS and alcohol abuse, feels anxious and angry at times.She says she is trying to find PCP.  Review of Systems Patient denies any headaches, hearing loss, fatigue, blurred vision, shortness of breath, chest pain, problems with bowel movements, urination, or intercourse. No joint pain, see HPI for positives. Reviewed past medical,surgical, social and family history. Reviewed medications and allergies.     Objective:   Physical Exam BP 98/70 mmHg  Pulse 92  Ht 5' 4.5" (1.638 m)  Wt 157 lb (71.215 kg)  BMI 26.54 kg/m2  LMP 11/04/2014 Skin warm and dry, abdomen soft, no HSM,has good bowel sounds in all 4 quadrants, mild tenderness at navel area.Reviewed CT with her, has 3.5 cm left ovarian cyst that looks benign, told her we will follow up with [place] in 4 weeks and she agrees.She missed GI appt today and has another for 9/15.Stressed taking meds as directed and following up with GI.Encouraged to stay off alcohol.    Assessment:     Left ovarian cyst Abdominal pain History of colitis Anxiety Alcohol abuse    Plan:    Take meds from hospital  Rx librium 25 mg 1 every 6 hours prn anxiety # 30 with 1 refill Return in 4 weeks, will reassess and get pelvic US then Review handout on ovarian cyst Follow up with GI as scheduled

## 2014-12-12 ENCOUNTER — Telehealth: Payer: Self-pay | Admitting: Adult Health

## 2014-12-12 NOTE — Telephone Encounter (Signed)
Had 1 day of dark blood about 4 days ago, has appt  9/22 for [place] to reassess cyst

## 2014-12-14 ENCOUNTER — Telehealth: Payer: Self-pay | Admitting: Adult Health

## 2014-12-14 NOTE — Telephone Encounter (Signed)
Spoke with pt. Pt didn't have period in August. Ovulation test says she's ovulating. Pt hasn't done a pregnancy test. I advised to take a pregnancy test. Pt wants to get pregnant. I advised to have sex since her ovulation test says she's ovulating. Pt has appt here on 12/27/14. Advised to keep that appt. Pt voiced understanding. JSY

## 2014-12-20 ENCOUNTER — Ambulatory Visit: Payer: Self-pay | Admitting: Nurse Practitioner

## 2014-12-27 ENCOUNTER — Ambulatory Visit: Payer: Self-pay | Admitting: Adult Health

## 2014-12-27 ENCOUNTER — Ambulatory Visit (INDEPENDENT_AMBULATORY_CARE_PROVIDER_SITE_OTHER): Payer: Self-pay | Admitting: Adult Health

## 2014-12-27 ENCOUNTER — Encounter: Payer: Self-pay | Admitting: Adult Health

## 2014-12-27 VITALS — BP 100/60 | HR 76 | Ht 64.5 in | Wt 162.0 lb

## 2014-12-27 DIAGNOSIS — R109 Unspecified abdominal pain: Secondary | ICD-10-CM

## 2014-12-27 DIAGNOSIS — N8320 Unspecified ovarian cysts: Secondary | ICD-10-CM

## 2014-12-27 DIAGNOSIS — N83202 Unspecified ovarian cyst, left side: Secondary | ICD-10-CM

## 2014-12-27 LAB — POCT URINE PREGNANCY: Preg Test, Ur: NEGATIVE

## 2014-12-27 NOTE — Patient Instructions (Signed)
Gyn [place] at [facility] 9/28 at 1:30 be there at 1:15 pm, will talk when results  Follow up prn

## 2014-12-27 NOTE — Progress Notes (Signed)
Subjective:     Patient ID: Kara Stewart, female   DOB: 09-19-1975, 39 y.o.   MRN: [id number]  HPI Kara Stewart is a 39 year old white female in for follow up of ovarian cyst and abdominal pain.Has appt with Tana Coast PA 10-4 and she says she is not drinking as much, did have set back on vacation and is seeing chiropractor and feels better, she is actually smiling today.  Review of Systems Patient denies any headaches, hearing loss, fatigue, blurred vision, shortness of breath, chest pain,  problems with bowel movements, urination, or intercourse. No joint pain or mood swings.See HPI for positives.   Reviewed past medical,surgical, social and family history. Reviewed medications and allergies.  Objective:   Physical Exam BP 100/60 mmHg  Pulse 76  Ht 5' 4.5" (1.638 m)  Wt 162 lb (73.483 kg)  BMI 27.39 kg/m2  LMP 11/29/2014 UPT negative, Skin warm and dry.Pelvic: external genitalia is normal in appearance no lesions, vagina: white discharge without odor,urethra has no lesions or masses noted, cervix:smooth and bulbous, uterus: normal size, shape and contour, non tender, no masses felt, adnexa: no masses, mild tenderness noted across. Bladder is non tender and no masses felt.     Assessment:     Left ovarian cyst Abdominal pain    Plan:     Gyn [place] 9/28 at 1:30 pm at [facility], will talk when results back Follow up prn Keep app 10/4 with GI

## 2015-01-02 ENCOUNTER — Ambulatory Visit (HOSPITAL_COMMUNITY): Admission: RE | Admit: 2015-01-02 | Payer: Self-pay | Source: Ambulatory Visit

## 2015-01-02 ENCOUNTER — Other Ambulatory Visit (HOSPITAL_COMMUNITY): Payer: Self-pay

## 2015-01-03 ENCOUNTER — Other Ambulatory Visit (HOSPITAL_COMMUNITY): Payer: Self-pay

## 2015-01-03 ENCOUNTER — Other Ambulatory Visit: Payer: Self-pay | Admitting: Adult Health

## 2015-01-03 ENCOUNTER — Ambulatory Visit (HOSPITAL_COMMUNITY)
Admission: RE | Admit: 2015-01-03 | Discharge: 2015-01-03 | Disposition: A | Discharge status: Home / Self Care | Payer: Self-pay | Source: Ambulatory Visit | Attending: Adult Health | Admitting: Adult Health

## 2015-01-03 DIAGNOSIS — N83202 Unspecified ovarian cyst, left side: Secondary | ICD-10-CM

## 2015-01-03 DIAGNOSIS — N8320 Unspecified ovarian cysts: Secondary | ICD-10-CM | POA: Insufficient documentation

## 2015-01-07 ENCOUNTER — Telehealth: Payer: Self-pay | Admitting: Adult Health

## 2015-01-07 NOTE — Telephone Encounter (Signed)
Left message that [place] was negative

## 2015-01-07 NOTE — Telephone Encounter (Signed)
Left message that US was negative  

## 2015-01-08 ENCOUNTER — Encounter: Payer: Self-pay | Admitting: Gastroenterology

## 2015-01-08 ENCOUNTER — Ambulatory Visit (INDEPENDENT_AMBULATORY_CARE_PROVIDER_SITE_OTHER): Payer: Self-pay | Admitting: Gastroenterology

## 2015-01-08 ENCOUNTER — Other Ambulatory Visit: Payer: Self-pay

## 2015-01-08 VITALS — BP 125/79 | HR 78 | Temp 97.6°F | Ht 64.5 in | Wt 157.2 lb

## 2015-01-08 DIAGNOSIS — R197 Diarrhea, unspecified: Secondary | ICD-10-CM

## 2015-01-08 DIAGNOSIS — K760 Fatty (change of) liver, not elsewhere classified: Secondary | ICD-10-CM

## 2015-01-08 DIAGNOSIS — F1010 Alcohol abuse, uncomplicated: Secondary | ICD-10-CM

## 2015-01-08 DIAGNOSIS — R933 Abnormal findings on diagnostic imaging of other parts of digestive tract: Secondary | ICD-10-CM

## 2015-01-08 MED ORDER — CHLORDIAZEPOXIDE HCL 25 MG PO CAPS: ORAL_CAPSULE | ORAL | Status: DC

## 2015-01-08 MED ORDER — SUCRALFATE 1 G PO TABS: 1.0000 g | ORAL_TABLET | Freq: Three times a day (TID) | ORAL | Status: DC

## 2015-01-08 NOTE — Progress Notes (Signed)
Primary Care Physician: No PCP Per Patient  Primary Gastroenterologist:  Jonette Eva, MD   Chief Complaint  Patient presents with  . Follow-up  . Abdominal Pain    HPI: Kara Stewart is a 39 y.o. female here for follow up of recent hospitalization at [facility] for N/V, lower abdominal pain. Patient describes acute onset nausea and vomiting, lower abdominal pain that woke her up from sleep. Never experienced lower abdominal pain like this before. At baseline has typically had multiple loose stools daily. Really didn't see any significant change with recent episode. Generally has at least 3 stools daily, never completely solid, usually very soft liquid. No brbpr, melena. CT scan suggested colitis involving the transverse colon and descending colon. No stool studies were obtained. She was treated empirically with Cipro and Flagyl. Mild thrombocytopenia noted (new finding). LFTs consistent with alcoholic hepatitis at the time. Patient has a history of chronic right upper quadrant abdominal pain with severe fatty liver noted on prior imaging studies in the setting of alcohol abuse. When I last saw her in August she started on Librium and had quit alcohol for about a month. States her right upper quadrant abdominal pain had subsided. Actually has been able to get off of Dexilant, with infrequent heartburn. Backslid with alcohol prior to recent admission. Since treatment with Cipro and Flagyl, her nausea vomiting has improved but she continues to have lower abdominal pain which is unchanged. Stopped a lot of vitamins to see if they were contributing to her symptoms. Carafate twice per day. Can tell when doesn't take it. carafate seems to help the burning.   Prior history: History of gastritis, esophagitis, dysphagia, diarrhea, alcohol abuse. Fecal elastase normal. Not a candidate for Viberzi because of alcohol abuse. BPE for dysphagia showed no evidence of strictures. Abdominal ultrasound in  May 2016 showed fatty liver. HIDA scan in May 2016 showed normal gallbladder ejection fraction. Patient had some abdominal cramping during [facility] infusion. Referred to [facility] to discuss possible cholecystectomy, patient had [facility] assistance. 11/04/2014, CT scan done during ER visit showed again severe hepatic steatosis, mild splenomegaly.  Current Outpatient Prescriptions  Medication Sig Dispense Refill  . metoprolol tartrate (LOPRESSOR) 25 MG tablet Take 12.5 mg by mouth as needed (for palpitations).     . sucralfate (CARAFATE) 1 G tablet Take 1 tablet (1 g total) by mouth 4 (four) times daily -  with meals and at bedtime. As needed for abdominal burning. 120 tablet 1  . chlordiazePOXIDE (LIBRIUM) 25 MG capsule Take 2 ( ) four times daily on day one then take 1 ( ) four times daily for one week as needed for anxiety. 36 capsule 0   No current facility-administered medications for this visit.    Allergies as of 01/08/2015 - Review Complete 01/08/2015  Allergen Reaction Noted  . Penicillins Anaphylaxis 10/18/2011  . Lidocaine Other (See Comments) 06/22/2014   Past Medical History  Diagnosis Date  . SVT (supraventricular tachycardia) (HCC)   . Hiatal hernia   . Anxiety   . Acid reflux   . Dyspepsia 2004    Dx w/ PUD but no EGD, [place], [place]   . Hypertriglyceridemia   . Hypothyroid   . Iritis     frequent  . Hypertension   . Asthma   . Panic attacks   . Thyroid nodule   . RLQ abdominal pain 01/11/2014  . Vaginal discharge 01/11/2014  . Trichomonal vaginitis 01/11/2014  . Weight gain 01/11/2014  . Irregular periods  01/11/2014  . 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 Alcohol abuse 07/21/2012  . Ovarian cyst   . Colitis   . History of colitis 11/29/2014   Past Surgical History  Procedure Laterality Date  . Esophagogastroduodenoscopy  11/06/2011    [name]: MILD Esophagitis/PATENT ESOPHAGEAL Stricture/  Moderate gastritis. Bx no.hpylori or celiac, +gastritis  . Esophagogastroduodenoscopy  (egd) with propofol N/A 03/20/2014    [name]: 1. Mild esophagitis & distal esophagela stricture. 2. small hiatal hernia 3. moderate non-erosive gastritis and mild duodenits  . Savory dilation N/A 03/20/2014    Procedure: SAVORY DILATION;  Surgeon: West Bali, MD;  Location: [facility];  Service: Endoscopy;  Laterality: N/A;  dilated with # 12.8, 14,15,16   Family History  Problem Relation Age of Onset  . Stomach cancer Paternal Grandfather     colon cancer  . Cancer Paternal Grandfather     throat and esophagus  . Breast cancer Maternal Grandmother   . Cancer Maternal Grandmother     skin  . Anxiety disorder Maternal Grandmother   . Hypertension Mother   . Other Mother     fatty liver  . Hyperlipidemia Mother   . Other Father     varicose veins; stomach issues; hernia  . Hypertension Father   . Hyperlipidemia Father   . Cancer Father     prostate  . Arthritis Father     rheumatoid  . Thyroid disease Sister   . Other Brother     hernia  . Diabetes Maternal Grandfather   . Other Paternal Grandmother     hernia  . COPD Paternal Grandmother   . Liver disease Mother     fatty liver, does not drink.    Social History   Social History  . Marital Status: Married    Spouse Name: N/A  . Number of Children: 0  . 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 Smoking status: Never Smoker   . Smokeless tobacco: Never Used  . Alcohol Use: No     Comment: recently tried to quit, lasted for about one month, recently started back (01/08/15)  . Drug Use: No  . Sexual Activity: Yes    Birth Control/ Protection: None   Other Topics Concern  . None   Social History Narrative   Lives w/ grandfather or parents or family member (moved from [place] 1 month)   Previous MD: Phill Mutter, NP ([place], [place])          ROS:  General: Negative for anorexia, weight loss, fever, chills, fatigue, weakness. ENT: Negative for  hoarseness, difficulty swallowing , nasal congestion. CV: Negative for chest pain, angina, palpitations, dyspnea on exertion, peripheral edema.  Respiratory: Negative for dyspnea at rest, dyspnea on exertion, cough, sputum, wheezing.  GI: See history of present illness. GU:  Negative for dysuria, hematuria, urinary incontinence, urinary frequency, nocturnal urination.  Endo: Negative for unusual weight change.    Physical Examination:   BP 125/79 mmHg  Pulse 78  Temp(Src) 97.6 F (36.4 C) (Oral)  Ht 5' 4.5" (1.638 m)  Wt 157 lb 3.2 oz (71.305 kg)  BMI 26.58 kg/m2  LMP 11/29/2014  General: Well-nourished, well-developed in no acute distress.  Eyes: No icterus. Mouth: Oropharyngeal mucosa moist and pink , no lesions erythema or exudate. Lungs: Clear to auscultation bilaterally.  Heart: Regular rate and rhythm, no murmurs rubs or gallops.  Abdomen: Bowel sounds are normal, mild diffuse  tenderness with moderate tenderness in the lower abdomen bilaterally, nondistended, no hepatosplenomegaly or masses, no abdominal bruits or hernia , no rebound or guarding.   Extremities: No lower extremity edema. No clubbing or deformities. Neuro: Alert and oriented x 4   Skin: Warm and dry, no jaundice.   Psych: Alert and cooperative, normal mood and affect.  Labs:  Labs from 11/26/2014 Glucose 124, creatinine 0.6, calcium 8.3, albumen 3.07, total bilirubin 1.4, alkaline phosphatase 74, AST 94.4, ALT 65  11/27/2014 White blood cell count 2.7, hemoglobin 11 (normal 11.5-15), MCV 111, platelets 112  11/24/2014 Lipase 48.8 normal total bilirubin 1.8, direct bilirubin 0.5, alkaline phosphatase 107 high, AST 244 high, ALT 125, albumin 4, cannabinoids positive, blood alcohol 17  Imaging Studies: US Transvaginal Non-ob  2015-01-26   CLINICAL DATA:  Follow-up left ovarian cysts  EXAM: TRANSABDOMINAL AND TRANSVAGINAL ULTRASOUND OF PELVIS  TECHNIQUE: Both transabdominal and transvaginal ultrasound  examinations of the pelvis were performed. Transabdominal technique was performed for global imaging of the pelvis including uterus, ovaries, adnexal regions, and pelvic cul-de-sac. It was necessary to proceed with endovaginal exam following the transabdominal exam to visualize the bilateral ovaries.  COMPARISON:  CT abdomen pelvis dated 11/24/2014  FINDINGS: Uterus  Measurements: 9.0 x 3.3 x 6.0 cm. No fibroids or other mass visualized.  Endometrium  Thickness: 4 mm.  No focal abnormality visualized.  Right ovary  Measurements: 2.8 x 1.6 x 1.8 cm. Normal appearance/no adnexal mass.  Left ovary  Measurements: 3.2 x 1.7 x 2.0 cm. 1.4 x 1.2 x 1.1 cm hemorrhagic corpus luteal cyst.  Other findings  No free fluid.  IMPRESSION: Negative pelvic ultrasound.   Electronically Signed   By: Charline Bills M.D.   On: 01-26-15 14:15   US Pelvis Complete  Jan 26, 2015   CLINICAL DATA:  Follow-up left ovarian cysts  EXAM: TRANSABDOMINAL AND TRANSVAGINAL ULTRASOUND OF PELVIS  TECHNIQUE: Both transabdominal and transvaginal ultrasound examinations of the pelvis were performed. Transabdominal technique was performed for global imaging of the pelvis including uterus, ovaries, adnexal regions, and pelvic cul-de-sac. It was necessary to proceed with endovaginal exam following the transabdominal exam to visualize the bilateral ovaries.  COMPARISON:  CT abdomen pelvis dated 11/24/2014  FINDINGS: Uterus  Measurements: 9.0 x 3.3 x 6.0 cm. No fibroids or other mass visualized.  Endometrium  Thickness: 4 mm.  No focal abnormality visualized.  Right ovary  Measurements: 2.8 x 1.6 x 1.8 cm. Normal appearance/no adnexal mass.  Left ovary  Measurements: 3.2 x 1.7 x 2.0 cm. 1.4 x 1.2 x 1.1 cm hemorrhagic corpus luteal cyst.  Other findings  No free fluid.  IMPRESSION: Negative pelvic ultrasound.   Electronically Signed   By: Charline Bills M.D.   On: 2015-01-26 14:15   CT abdomen and pelvis with contrast at [facility] on  11/24/2014 Mild diffuse colonic wall thickening involving the descending and transverse colon, there are also inflammatory nodes in the right lower quadrant, severe diffuse hepatic steatosis, 3.5 cm left adnexal mass. Appendix appeared normal.

## 2015-01-08 NOTE — Assessment & Plan Note (Addendum)
Patient reports stopping alcohol for about 4 weeks until she recently messed up. Noted mild etoh hepatitis during recent hospitalization. She would like to try again. Librium Rx provided. Discussed again at length severe diffuse hepatic steatosis and mild splenomegaly noted on imaging study and concern for progression to cirrhosis if she does not stop drinking. Patient voiced understanding and desire to quit drinking.

## 2015-01-08 NOTE — Assessment & Plan Note (Addendum)
40 year old female with chronic loose stools, recent hospitalization for nausea/vomiting, acute onset lower abdominal pain. CT with transverse and ascending colon wall thickening. Treated empirically with Cipro and Flagyl with minimal improvement of symptoms. No prior colonoscopy. Recommend colonoscopy in the near future for further evaluation. Given history of alcohol abuse, plan for deep sedation.  I have discussed the risks, alternatives, benefits with regards to but not limited to the risk of reaction to medication, bleeding, infection, perforation and the patient is agreeable to proceed. Written consent to be obtained.  If abdominal pain or diarrhea worsen in the interim, then call the office. Notable, appendix appeared normal on recent CT.

## 2015-01-08 NOTE — Progress Notes (Signed)
No pcp per patient 

## 2015-01-08 NOTE — Patient Instructions (Signed)
1. Take Librium to prevent withdrawals/anxiety. Start with two ( ) four times daily for one day and then  four times daily for one week, then off. 2. Carafate up to four times daily as needed. RX sent to pharmacy. 3. Colonoscopy with Dr. Darrick Penna. See separate instructions.

## 2015-01-09 ENCOUNTER — Telehealth: Payer: Self-pay

## 2015-01-09 NOTE — Telephone Encounter (Signed)
Pt said at OV yesterday she did her labs at [facility] across from the hospital. She said she did them a day or so after they were ordered on 11/20/2014. Said she had to fill out more paperwork there for financial assistance, but they did draw her blood. Per, the lab, looks like pt never came in.  I have called pt and LMOM for a return call.

## 2015-01-10 NOTE — Progress Notes (Signed)
2015: 169 LBS.  OCT 2016: 157 LBS. REVIEWED-TCS WITH RANDOM BIOPSIES AND ILEOSCOPY

## 2015-01-16 NOTE — Telephone Encounter (Signed)
I tried to call pt aain. Mail box full and I could not leave a message.

## 2015-01-16 NOTE — Telephone Encounter (Signed)
At this point with information provided from lab, all we can do is request patient to have labs done.

## 2015-01-16 NOTE — Telephone Encounter (Signed)
I have mailed the lab orders along with a letter for pt to have done when she gets them.

## 2015-01-20 ENCOUNTER — Emergency Department (HOSPITAL_COMMUNITY): Payer: Self-pay

## 2015-01-20 ENCOUNTER — Observation Stay (HOSPITAL_COMMUNITY)
Admission: EM | Admit: 2015-01-20 | Discharge: 2015-01-24 | Disposition: A | Discharge status: Home / Self Care | Payer: Self-pay | Attending: Internal Medicine | Admitting: Internal Medicine

## 2015-01-20 ENCOUNTER — Encounter (HOSPITAL_COMMUNITY): Payer: Self-pay | Admitting: Emergency Medicine

## 2015-01-20 DIAGNOSIS — F10939 Alcohol use, unspecified with withdrawal, unspecified: Secondary | ICD-10-CM | POA: Diagnosis present

## 2015-01-20 DIAGNOSIS — Z87898 Personal history of other specified conditions: Secondary | ICD-10-CM

## 2015-01-20 DIAGNOSIS — F1210 Cannabis abuse, uncomplicated: Secondary | ICD-10-CM | POA: Insufficient documentation

## 2015-01-20 DIAGNOSIS — F329 Major depressive disorder, single episode, unspecified: Secondary | ICD-10-CM | POA: Insufficient documentation

## 2015-01-20 DIAGNOSIS — E669 Obesity, unspecified: Secondary | ICD-10-CM | POA: Diagnosis present

## 2015-01-20 DIAGNOSIS — F410 Panic disorder [episodic paroxysmal anxiety] without agoraphobia: Secondary | ICD-10-CM | POA: Diagnosis present

## 2015-01-20 DIAGNOSIS — Z880 Allergy status to penicillin: Secondary | ICD-10-CM | POA: Insufficient documentation

## 2015-01-20 DIAGNOSIS — K219 Gastro-esophageal reflux disease without esophagitis: Secondary | ICD-10-CM | POA: Diagnosis present

## 2015-01-20 DIAGNOSIS — F10239 Alcohol dependence with withdrawal, unspecified: Principal | ICD-10-CM | POA: Insufficient documentation

## 2015-01-20 DIAGNOSIS — E039 Hypothyroidism, unspecified: Secondary | ICD-10-CM | POA: Insufficient documentation

## 2015-01-20 DIAGNOSIS — K760 Fatty (change of) liver, not elsewhere classified: Secondary | ICD-10-CM | POA: Insufficient documentation

## 2015-01-20 DIAGNOSIS — Z6830 Body mass index (BMI) 30.0-30.9, adult: Secondary | ICD-10-CM | POA: Insufficient documentation

## 2015-01-20 DIAGNOSIS — F419 Anxiety disorder, unspecified: Secondary | ICD-10-CM | POA: Insufficient documentation

## 2015-01-20 DIAGNOSIS — R109 Unspecified abdominal pain: Secondary | ICD-10-CM

## 2015-01-20 DIAGNOSIS — I10 Essential (primary) hypertension: Secondary | ICD-10-CM | POA: Insufficient documentation

## 2015-01-20 DIAGNOSIS — F1291 Cannabis use, unspecified, in remission: Secondary | ICD-10-CM

## 2015-01-20 HISTORY — DX: Personal history of other specified conditions: Z87.898

## 2015-01-20 HISTORY — DX: Cannabis use, unspecified, in remission: F12.91

## 2015-01-20 LAB — COMPREHENSIVE METABOLIC PANEL
ALK PHOS: 62 U/L (ref 38–126)
ALT: 38 U/L (ref 14–54)
AST: 52 U/L — AB (ref 15–41)
Albumin: 4 g/dL (ref 3.5–5.0)
Anion gap: 14 (ref 5–15)
BUN: 8 mg/dL (ref 6–20)
CALCIUM: 9.3 mg/dL (ref 8.9–10.3)
CHLORIDE: 104 mmol/L (ref 101–111)
CO2: 21 mmol/L — AB (ref 22–32)
CREATININE: 0.76 mg/dL (ref 0.44–1.00)
GFR calc Af Amer: >60 mL/min (ref >60)
Glucose, Bld: 85 mg/dL (ref 65–99)
Potassium: 3.5 mmol/L (ref 3.5–5.1)
SODIUM: 139 mmol/L (ref 135–145)
Total Bilirubin: 0.7 mg/dL (ref 0.3–1.2)
Total Protein: 6.8 g/dL (ref 6.5–8.1)

## 2015-01-20 LAB — RAPID URINE DRUG SCREEN, HOSP PERFORMED
AMPHETAMINES: NOT DETECTED
BENZODIAZEPINES: NOT DETECTED
Barbiturates: NOT DETECTED
COCAINE: NOT DETECTED
OPIATES: NOT DETECTED
TETRAHYDROCANNABINOL: POSITIVE — AB

## 2015-01-20 LAB — CBC
HCT: 40.1 % (ref 36.0–46.0)
HCT: 36.6 % (ref 36.0–46.0)
HEMOGLOBIN: 12.6 g/dL (ref 12.0–15.0)
Hemoglobin: 14.3 g/dL (ref 12.0–15.0)
MCH: 34 pg (ref 26.0–34.0)
MCH: 33.2 pg (ref 26.0–34.0)
MCHC: 35.7 g/dL (ref 30.0–36.0)
MCHC: 34.4 g/dL (ref 30.0–36.0)
MCV: 95.2 fL (ref 78.0–100.0)
MCV: 96.6 fL (ref 78.0–100.0)
PLATELETS: 187 10*3/uL (ref 150–400)
Platelets: 146 10*3/uL — ABNORMAL LOW (ref 150–400)
RBC: 4.21 MIL/uL (ref 3.87–5.11)
RBC: 3.79 MIL/uL — AB (ref 3.87–5.11)
RDW: 12.5 % (ref 11.5–15.5)
RDW: 12.4 % (ref 11.5–15.5)
WBC: 5 10*3/uL (ref 4.0–10.5)
WBC: 4.3 10*3/uL (ref 4.0–10.5)

## 2015-01-20 LAB — LIPASE, BLOOD: Lipase: 50 U/L (ref 22–51)

## 2015-01-20 LAB — CREATININE, SERUM
CREATININE: 0.71 mg/dL (ref 0.44–1.00)
GFR calc Af Amer: >60 mL/min (ref >60)

## 2015-01-20 LAB — MAGNESIUM: Magnesium: 1.6 mg/dL — ABNORMAL LOW (ref 1.7–2.4)

## 2015-01-20 LAB — ETHANOL: Alcohol, Ethyl (B): 29 mg/dL — ABNORMAL HIGH (ref <5)

## 2015-01-20 MED ORDER — LORAZEPAM 2 MG/ML IJ SOLN
0.0000 mg | Freq: Four times a day (QID) | INTRAMUSCULAR | Status: DC
  Administered 2015-01-20: 2 mg via INTRAVENOUS
  Filled 2015-01-20 (×2): qty 1

## 2015-01-20 MED ORDER — SODIUM CHLORIDE 0.9 % IV BOLUS (SEPSIS)
1000.0000 mL | Freq: Once | INTRAVENOUS | Status: AC
  Administered 2015-01-20: 1000 mL via INTRAVENOUS

## 2015-01-20 MED ORDER — KETOROLAC TROMETHAMINE 15 MG/ML IJ SOLN
15.0000 mg | Freq: Once | INTRAMUSCULAR | Status: AC
  Administered 2015-01-20: 15 mg via INTRAVENOUS
  Filled 2015-01-20: qty 1

## 2015-01-20 MED ORDER — LORAZEPAM 1 MG PO TABS
0.0000 mg | ORAL_TABLET | Freq: Two times a day (BID) | ORAL | Status: DC
  Administered 2015-01-20: 1 mg via ORAL
  Administered 2015-01-21: 2 mg via ORAL
  Administered 2015-01-22: 1 mg via ORAL
  Filled 2015-01-20: qty 1
  Filled 2015-01-20: qty 2
  Filled 2015-01-20: qty 1

## 2015-01-20 MED ORDER — PANTOPRAZOLE SODIUM 40 MG PO TBEC
40.0000 mg | DELAYED_RELEASE_TABLET | Freq: Every day | ORAL | Status: DC
  Administered 2015-01-20 – 2015-01-24 (×5): 40 mg via ORAL
  Filled 2015-01-20 (×5): qty 1

## 2015-01-20 MED ORDER — ACETAMINOPHEN 325 MG PO TABS: 650.0000 mg | ORAL_TABLET | Freq: Four times a day (QID) | ORAL | Status: DC | PRN

## 2015-01-20 MED ORDER — ACETAMINOPHEN 325 MG PO TABS
650.0000 mg | ORAL_TABLET | Freq: Three times a day (TID) | ORAL | Status: DC | PRN
  Filled 2015-01-20: qty 2

## 2015-01-20 MED ORDER — ONDANSETRON HCL 4 MG/2ML IJ SOLN
4.0000 mg | Freq: Once | INTRAMUSCULAR | Status: AC
  Administered 2015-01-20: 4 mg via INTRAVENOUS
  Filled 2015-01-20: qty 2

## 2015-01-20 MED ORDER — SODIUM CHLORIDE 0.9 % IJ SOLN
3.0000 mL | Freq: Two times a day (BID) | INTRAMUSCULAR | Status: DC
  Administered 2015-01-20 – 2015-01-22 (×3): 3 mL via INTRAVENOUS

## 2015-01-20 MED ORDER — ACETAMINOPHEN 650 MG RE SUPP: 650.0000 mg | Freq: Four times a day (QID) | RECTAL | Status: DC | PRN

## 2015-01-20 MED ORDER — PROMETHAZINE HCL 25 MG/ML IJ SOLN
12.5000 mg | Freq: Once | INTRAMUSCULAR | Status: DC
  Filled 2015-01-20 (×2): qty 1

## 2015-01-20 MED ORDER — LORAZEPAM 2 MG/ML IJ SOLN
0.0000 mg | Freq: Two times a day (BID) | INTRAMUSCULAR | Status: DC
  Administered 2015-01-20: 4 mg via INTRAVENOUS
  Administered 2015-01-21: 1 mg via INTRAVENOUS
  Filled 2015-01-20: qty 2

## 2015-01-20 MED ORDER — VITAMIN B-1 100 MG PO TABS
100.0000 mg | ORAL_TABLET | Freq: Every day | ORAL | Status: DC
  Administered 2015-01-21 – 2015-01-24 (×4): 100 mg via ORAL
  Filled 2015-01-20 (×4): qty 1

## 2015-01-20 MED ORDER — ONDANSETRON HCL 4 MG PO TABS: 4.0000 mg | ORAL_TABLET | Freq: Four times a day (QID) | ORAL | Status: DC | PRN

## 2015-01-20 MED ORDER — LORAZEPAM 1 MG PO TABS
0.0000 mg | ORAL_TABLET | Freq: Four times a day (QID) | ORAL | Status: DC
  Administered 2015-01-20: 1 mg via ORAL
  Administered 2015-01-21 (×2): 2 mg via ORAL
  Administered 2015-01-21 – 2015-01-22 (×2): 1 mg via ORAL
  Filled 2015-01-20: qty 1
  Filled 2015-01-20: qty 2
  Filled 2015-01-20 (×2): qty 1

## 2015-01-20 MED ORDER — ONDANSETRON HCL 4 MG/2ML IJ SOLN
4.0000 mg | Freq: Four times a day (QID) | INTRAMUSCULAR | Status: DC | PRN
  Administered 2015-01-21 – 2015-01-22 (×2): 4 mg via INTRAVENOUS
  Filled 2015-01-20 (×2): qty 2

## 2015-01-20 MED ORDER — POLYETHYLENE GLYCOL 3350 17 G PO PACK: 17.0000 g | PACK | Freq: Every day | ORAL | Status: DC | PRN

## 2015-01-20 MED ORDER — THIAMINE HCL 100 MG/ML IJ SOLN
100.0000 mg | Freq: Every day | INTRAMUSCULAR | Status: DC
  Administered 2015-01-20 – 2015-01-21 (×2): 100 mg via INTRAVENOUS
  Filled 2015-01-20 (×2): qty 2

## 2015-01-20 MED ORDER — HEPARIN SODIUM (PORCINE) 5000 UNIT/ML IJ SOLN
5000.0000 [IU] | Freq: Three times a day (TID) | INTRAMUSCULAR | Status: DC
  Administered 2015-01-20 – 2015-01-22 (×4): 5000 [IU] via SUBCUTANEOUS
  Filled 2015-01-20 (×7): qty 1

## 2015-01-20 NOTE — Progress Notes (Signed)
Clinician received tele-psych consult for psychiatrist/extender to evaluate patient for ETOH abuse/withdrawal. Consult logged for follow up. Dr. Rhona Leavenshiu stated patient is projected to be transferred to medical floor.    Kara ColonelGregory Pickett Jr. MSW, LCSW Therapeutic Triage Services-Triage Specialist   Phone: [phone]

## 2015-01-20 NOTE — Progress Notes (Signed)
Late entry for 1745 pt arrived on unit in bed from ED, with husband at bedside, oriented to unit and instructed on use of call bell and reduce risk of falling. Will continue to monitor.

## 2015-01-20 NOTE — ED Notes (Signed)
Paged triad to [id number] 

## 2015-01-20 NOTE — ED Provider Notes (Signed)
CSN: [id number]     Arrival date & time 01/20/15  0802 History   First MD Initiated Contact with Patient 01/20/15 0815     Chief Complaint  Patient presents with  . Alcohol Problem   HPI   39 year old female presents today with alcohol withdrawals. Patient reports long-standing history of alcohol use, approximately 1 pint of vodka per day. She reports multiple attempts at quitting that have been unsuccessful. She notes that she has been tapering over the last week, with her last drink yesterday around 2 PM. She reports through the night tremors, chills, nausea, vomiting, headache and belly pain. She reports the symptoms are similar to previous episodes of alcohol withdrawal.  Patient notes she was dizzy while throwing up today fell hitting the front of her head. She reports headache at this time, frontal, no complete loss of consciousness, no change in smell vision or taste.   Past Medical History  Diagnosis Date  . SVT (supraventricular tachycardia) (HCC)   . Hiatal hernia   . Anxiety   . Acid reflux   . Dyspepsia 2004    Dx w/ PUD but no EGD, [place], [place]   . Hypertriglyceridemia   . Hypothyroid   . Iritis     frequent  . Hypertension   . Asthma   . Panic attacks   . Thyroid nodule   . RLQ abdominal pain 01/11/2014  . Vaginal discharge 01/11/2014  . Trichomonal vaginitis 01/11/2014  . Weight gain 01/11/2014  . Irregular periods 01/11/2014  . 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 Alcohol abuse 07/21/2012  . Ovarian cyst   . Colitis   . History of colitis 11/29/2014   Past Surgical History  Procedure Laterality Date  . Esophagogastroduodenoscopy  11/06/2011    [name]: MILD Esophagitis/PATENT ESOPHAGEAL Stricture/  Moderate gastritis. Bx no.hpylori or celiac, +gastritis  . Esophagogastroduodenoscopy (egd) with propofol N/A 03/20/2014    [name]: 1. Mild esophagitis & distal esophagela stricture. 2. small hiatal hernia 3. moderate non-erosive gastritis and mild duodenits  . Savory dilation N/A  03/20/2014    Procedure: SAVORY DILATION;  Surgeon: West Bali, MD;  Location: [facility];  Service: Endoscopy;  Laterality: N/A;  dilated with # 12.8, 14,15,16   Family History  Problem Relation Age of Onset  . Stomach cancer Paternal Grandfather     colon cancer  . Cancer Paternal Grandfather     throat and esophagus  . Breast cancer Maternal Grandmother   . Cancer Maternal Grandmother     skin  . Anxiety disorder Maternal Grandmother   . Hypertension Mother   . Other Mother     fatty liver  . Hyperlipidemia Mother   . Other Father     varicose veins; stomach issues; hernia  . Hypertension Father   . Hyperlipidemia Father   . Cancer Father     prostate  . Arthritis Father     rheumatoid  . Thyroid disease Sister   . Other Brother     hernia  . Diabetes Maternal Grandfather   . Other Paternal Grandmother     hernia  . COPD Paternal Grandmother   . Liver disease Mother     fatty liver, does not drink.    Social History  Substance Use Topics  . Smoking status: Never Smoker   . Smokeless tobacco: Never Used  . Alcohol Use: 0.0 oz/week    0 Standard drinks or equivalent per week     Comment: recently tried to quit, lasted for about one  month, recently started back (01/08/15) 1 pint vodka a day 01-20-15   OB History    Gravida Para Term Preterm AB TAB SAB Ectopic Multiple Living   Review of Systems  All other systems reviewed and are negative.   Allergies  Penicillins and Lidocaine  Home Medications   Prior to Admission medications   Medication Sig Start Date End Date Taking? Authorizing Provider  chlordiazePOXIDE (LIBRIUM) 25 MG capsule Take 2 ( ) four times daily on day one then take 1 ( ) four times daily for one week as needed for anxiety. 01/08/15   Tiffany Kocher, PA-C  metoprolol tartrate (LOPRESSOR) 25 MG tablet Take 12.5 mg by mouth as needed (for palpitations).  08/13/12   Renae Fickle, MD  sucralfate (CARAFATE) 1 G tablet Take  1 tablet (1 g total) by mouth 4 (four) times daily -  with meals and at bedtime. As needed for abdominal burning. 01/08/15   Tiffany Kocher, PA-C   BP 97/86 mmHg  Pulse 116  Temp(Src) 97.8 F (36.6 C) (Oral)  Resp 25  Ht 5' 4.5" (1.638 m)  Wt 155 lb (70.308 kg)  BMI 26.20 kg/m2  SpO2 100%  LMP 11/22/2014 (Approximate)   Physical Exam  Constitutional: She is oriented to person, place, and time. She appears well-developed and well-nourished. She appears distressed.  Patient visibly uncomfortable, anxious, unable to stay calm exam bed  HENT:  Head: Normocephalic and atraumatic.  Minor bruising to the anterior forehead, no obvious deformities skull, neck supple full range of motion, nontender to palpation of the cervical spine or surrounding soft tissue.  Eyes: Conjunctivae are normal. Pupils are equal, round, and reactive to light. Right eye exhibits no discharge. Left eye exhibits no discharge. No scleral icterus.  Neck: Normal range of motion. No JVD present. No tracheal deviation present.  Cardiovascular: Regular rhythm, normal heart sounds and intact distal pulses.  Exam reveals no gallop and no friction rub.   No murmur heard. Pulmonary/Chest: Effort normal and breath sounds normal. No stridor. No respiratory distress. She has no wheezes. She has no rales. She exhibits no tenderness.  Abdominal: Soft. Bowel sounds are normal. She exhibits no distension and no mass. There is no tenderness. There is no rebound and no guarding.  Musculoskeletal: Normal range of motion. She exhibits no edema or tenderness.  Neurological: She is alert and oriented to person, place, and time. Coordination normal.  Tremors  Skin: Skin is warm. She is diaphoretic.  Psychiatric: She has a normal mood and affect. Her behavior is normal. Judgment and thought content normal.  Nursing note and vitals reviewed.   ED Course  Procedures (including critical care time) Labs Review Labs Reviewed  ETHANOL  CBC   URINE RAPID DRUG SCREEN, HOSP PERFORMED  COMPREHENSIVE METABOLIC PANEL    Imaging Review No results found. I have personally reviewed and evaluated these images and lab results as part of my medical decision-making.   EKG Interpretation None      MDM   Final diagnoses:  Alcohol withdrawal, with unspecified complication (HCC)    Labs: Urine rapid drug screen, ethanol, CBC, CMP, lipase  Imaging: CT head without contrast- no acute findings  Consults: Hospitalist  Therapeutics: Toradol, Ativan, thiamine, Phenergan, Zofran, normal saline  Discharge Meds:   Assessment/Plan: 39 year old female presents today with alcohol withdrawal symptoms. Upon arrival to see while score of 14, rapidly changing to 24. She was given  4 mg of Ativan with a baseline of 5. Due to symptomatic improvement patient was observed here in the ED for an extended period of time, she remained at a level of 5 with new symptoms presenting with a see while score of 11. She was given another 2 mg of Ativan. Patient wishes to discontinue alcohol use, she has never been admitted to our hospital system for this, new symptoms presenting even with Ativan here in the ED, I do not believe patient is a candidate for outpatient treatment. Consult hospitalist service for further management.         Eyvonne MechanicJeffrey Lamonica Trueba, PA-C 01/20/15 1744  Eyvonne MechanicJeffrey Dontrel Smethers, PA-C 01/20/15 1745  Margarita Grizzleanielle Ray, MD 01/21/15 1320

## 2015-01-20 NOTE — H&P (Addendum)
[facility] History and Physical  Kara Stewart JXB:[id number] DOB: 11-30-1975 DOA: 01/20/2015  Referring physician: Emergency Department PCP: No PCP Per Patient  Specialists:   Chief Complaint: Alcohol detox  HPI: Kara Stewart is a 39 y.o. female  With a hx of etoh abuse and anxiety who presents to the ED who presents with increased tremors while trying to detox herself from alcohol. Pt reports previously drinking 1pt of liquor daily, last intake was around 2pm on 10/15. Patient has been trying librium for symptoms with limited success. Patient subsequently presented to ED where pt was noted to have a high CIWA score, improved when CIWA protocol started. Hospitalist consulted for consideration for admission.  Review of Systems:  Review of Systems  Constitutional: Negative for fever, chills and malaise/fatigue.  HENT: Negative for ear pain and tinnitus.   Eyes: Negative for pain and discharge.  Respiratory: Negative for shortness of breath and wheezing.   Cardiovascular: Negative for palpitations and orthopnea.  Gastrointestinal: Negative for vomiting and abdominal pain.  Genitourinary: Negative for frequency and hematuria.  Musculoskeletal: Positive for falls. Negative for joint pain and neck pain.  Skin: Negative for itching and rash.  Neurological: Positive for tremors. Negative for tingling, seizures and loss of consciousness.  Psychiatric/Behavioral: Positive for substance abuse. Negative for hallucinations and memory loss.    Past Medical History  Diagnosis Date  . SVT (supraventricular tachycardia) (HCC)   . Hiatal hernia   . Anxiety   . Acid reflux   . Dyspepsia 2004    Dx w/ PUD but no EGD, [place], [place]   . Hypertriglyceridemia   . Hypothyroid   . Iritis     frequent  . Hypertension   . Asthma   . Panic attacks   . Thyroid nodule   . RLQ abdominal pain 01/11/2014  . Vaginal discharge 01/11/2014  . Trichomonal vaginitis 01/11/2014  .  Weight gain 01/11/2014  . Irregular periods 01/11/2014  . 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 Alcohol abuse 07/21/2012  . Ovarian cyst   . Colitis   . History of colitis 11/29/2014   Past Surgical History  Procedure Laterality Date  . Esophagogastroduodenoscopy  11/06/2011    [name]: MILD Esophagitis/PATENT ESOPHAGEAL Stricture/  Moderate gastritis. Bx no.hpylori or celiac, +gastritis  . Esophagogastroduodenoscopy (egd) with propofol N/A 03/20/2014    [name]: 1. Mild esophagitis & distal esophagela stricture. 2. small hiatal hernia 3. moderate non-erosive gastritis and mild duodenits  . Savory dilation N/A 03/20/2014    Procedure: SAVORY DILATION;  Surgeon: West BaliSandi L Fields, MD;  Location: [facility];  Service: Endoscopy;  Laterality: N/A;  dilated with # 12.8, 14,15,16   Social History:  reports that she has never smoked. She has never used smokeless tobacco. She reports that she drinks alcohol. She reports that she does not use illicit drugs.  where does patient live--home, ALF, SNF? and with whom if at home?  Can patient participate in ADLs?  Allergies  Allergen Reactions  . Penicillins Anaphylaxis  . Lidocaine Other (See Comments)    hallucinations    Family History  Problem Relation Age of Onset  . Stomach cancer Paternal Grandfather     colon cancer  . Cancer Paternal Grandfather     throat and esophagus  . Breast cancer Maternal Grandmother   . Cancer Maternal Grandmother     skin  . Anxiety disorder Maternal Grandmother   . Hypertension Mother   . Other Mother     fatty liver  . Hyperlipidemia Mother   .  Other Father     varicose veins; stomach issues; hernia  . Hypertension Father   . Hyperlipidemia Father   . Cancer Father     prostate  . Arthritis Father     rheumatoid  . Thyroid disease Sister   . Other Brother     hernia  . Diabetes Maternal Grandfather   . Other Paternal Grandmother     hernia  . COPD Paternal Grandmother   . Liver disease Mother     fatty liver,  does not drink.      Prior to Admission medications   Medication Sig Start Date End Date Taking? Authorizing Provider  chlordiazePOXIDE (LIBRIUM) 25 MG capsule Take 2 ( ) four times daily on day one then take 1 ( ) four times daily for one week as needed for anxiety. 01/08/15  Yes Tiffany Kocher, PA-C  metoprolol tartrate (LOPRESSOR) 25 MG tablet Take 12.5 mg by mouth as needed (for palpitations).  08/13/12  Yes Renae Fickle, MD  sucralfate (CARAFATE) 1 G tablet Take 1 tablet (1 g total) by mouth 4 (four) times daily -  with meals and at bedtime. As needed for abdominal burning. 01/08/15  Yes Tiffany Kocher, PA-C   Physical Exam: Filed Vitals:   01/20/15 1330 01/20/15 1400 01/20/15 1446 01/20/15 1500  BP: 122/73 125/85 120/73 112/70  Pulse: 105 102 100 109  Temp:   98.6 F (37 C)   TempSrc:   Oral   Resp: 50  Height:      Weight:      SpO2: 98% 100% 100% 98%     General:  Awake, in nad  Eyes: PERRL B  ENT: membranes moist, dentition fair  Neck: trachea midline, neck supple  Cardiovascular: regular, s1, s2  Respiratory: normal resp effort, no wheezing  Abdomen: soft,nondistended, nontender  Skin: normal skin turgor, no abnormal skin lesions seen  Musculoskeletal: perfused, no clubbing  Psychiatric: mood/affect normal// no auditory/visual hallucinations  Neurologic: cn2-12 grossly intact, strength/sensation intact, mild resting tremor  Labs on Admission:  Basic Metabolic Panel:  Recent Labs Lab 01/20/15 0830  NA 139  K 3.5  CL 104  CO2 21*  GLUCOSE 85  BUN 8  CREATININE 0.76  CALCIUM 9.3   Liver Function Tests:  Recent Labs Lab 01/20/15 0830  AST 52*  ALT 38  ALKPHOS 62  BILITOT 0.7  PROT 6.8  ALBUMIN 4.0    Recent Labs Lab 01/20/15 0830  LIPASE 50   No results for input(s): AMMONIA in the last 168 hours. CBC:  Recent Labs Lab 01/20/15 0830  WBC 5.0  HGB 14.3  HCT 40.1  MCV 95.2  PLT 187   Cardiac Enzymes: No  results for input(s): CKTOTAL, CKMB, CKMBINDEX, TROPONINI in the last 168 hours.  BNP (last 3 results) No results for input(s): BNP in the last 8760 hours.  ProBNP (last 3 results) No results for input(s): PROBNP in the last 8760 hours.  CBG: No results for input(s): GLUCAP in the last 168 hours.  Radiological Exams on Admission: Ct Head Wo Contrast  01/20/2015  CLINICAL DATA:  39 year old female with history of vomiting and dizziness. Patient reportedly fell and struck her frontal area on a cabinet. Alcohol abuse. EXAM: CT HEAD WITHOUT CONTRAST TECHNIQUE: Contiguous axial images were obtained from the base of the skull through the vertex without intravenous contrast. COMPARISON:  Head CT 10/26/2011. FINDINGS: No acute displaced skull fractures are identified. No acute intracranial abnormality. Specifically, no evidence of acute  post-traumatic intracranial hemorrhage, no definite regions of acute/subacute cerebral ischemia, no focal mass, mass effect, hydrocephalus or abnormal intra or extra-axial fluid collections. The visualized paranasal sinuses and mastoids are well pneumatized. IMPRESSION: 1. No acute displaced skull fractures or acute intracranial abnormalities. 2. The appearance of the brain is normal. Electronically Signed   By: Trudie Reed M.D.   On: 01/20/2015 10:22    Assessment/Plan Principal Problem:   Alcohol withdrawal (HCC) Active Problems:   GERD (gastroesophageal reflux disease)   Obesity (BMI 30-39.9)   ETOH abuse   Anxiety   Marijuana abuse  1. ETOH withdrawals 1. Continue CIWA as needed 2. Last ETOH intake was 2pm on 10/15 3. Pt is interested in AA program. Will consult Psychiatry for assistance 4. Admit to med-tele 2. GERD 1. Had been on Dexilant prior to admit 2. Will cont on PPI while here 3. Pt complaining of nausea, thus will cont on a clear liquid diet and advance diet as tolerated 3. Obesity 1. Stable 4. ETOH abuse 1. Pt desires AA  program 2. Little success with librium as outpt 3. Cont CIWA per above 5. Anxiety 1. Seems stable 2. Psychiatry as already been consulted per above 6. Marijuana abuse 1. Noted on UDS 2. Cessation done 7. DVT prophylaxis 1. Will cont on heparin subQ  Code Status: Full Family Communication: Pt in room, family at bedside Disposition Plan: Admit to med-tele   CHIU, Scheryl Marten [facility] Pager [phone]  If 7PM-7AM, please contact night-coverage [email or web address] Password [email or web address] 01/20/2015, 4:21 PM

## 2015-01-20 NOTE — ED Notes (Signed)
Given ice pack

## 2015-01-20 NOTE — ED Notes (Addendum)
Spouse Gwinda Passe- [place] [phone]- [phone]

## 2015-01-20 NOTE — ED Notes (Signed)
Gave pt.ice chip.

## 2015-01-20 NOTE — ED Notes (Signed)
Family at bedside. 

## 2015-01-20 NOTE — ED Notes (Signed)
Pt from home with request for ETOH detox.  Pt reports she has been trying to decrease her intake on her own.  Currently drinks approx 1 pint of vodka a day, last drink was yesterday around 1300.  Pt A&O, tremors present.

## 2015-01-21 DIAGNOSIS — F419 Anxiety disorder, unspecified: Secondary | ICD-10-CM

## 2015-01-21 DIAGNOSIS — F1010 Alcohol abuse, uncomplicated: Secondary | ICD-10-CM

## 2015-01-21 DIAGNOSIS — F1210 Cannabis abuse, uncomplicated: Secondary | ICD-10-CM

## 2015-01-21 DIAGNOSIS — E669 Obesity, unspecified: Secondary | ICD-10-CM

## 2015-01-21 DIAGNOSIS — K219 Gastro-esophageal reflux disease without esophagitis: Secondary | ICD-10-CM

## 2015-01-21 LAB — CBC
HCT: 35.3 % — ABNORMAL LOW (ref 36.0–46.0)
Hemoglobin: 12.1 g/dL (ref 12.0–15.0)
MCH: 33.1 pg (ref 26.0–34.0)
MCHC: 34.3 g/dL (ref 30.0–36.0)
MCV: 96.4 fL (ref 78.0–100.0)
PLATELETS: 127 10*3/uL — AB (ref 150–400)
RBC: 3.66 MIL/uL — AB (ref 3.87–5.11)
RDW: 12.5 % (ref 11.5–15.5)
WBC: 2.9 10*3/uL — AB (ref 4.0–10.5)

## 2015-01-21 LAB — COMPREHENSIVE METABOLIC PANEL
ALBUMIN: 3.1 g/dL — AB (ref 3.5–5.0)
ALT: 28 U/L (ref 14–54)
ANION GAP: 8 (ref 5–15)
AST: 44 U/L — ABNORMAL HIGH (ref 15–41)
Alkaline Phosphatase: 51 U/L (ref 38–126)
BUN: <5 mg/dL — ABNORMAL LOW (ref 6–20)
CHLORIDE: 103 mmol/L (ref 101–111)
CO2: 23 mmol/L (ref 22–32)
Calcium: 7.9 mg/dL — ABNORMAL LOW (ref 8.9–10.3)
Creatinine, Ser: 0.72 mg/dL (ref 0.44–1.00)
GFR calc non Af Amer: >60 mL/min (ref >60)
GLUCOSE: 86 mg/dL (ref 65–99)
Potassium: 3.1 mmol/L — ABNORMAL LOW (ref 3.5–5.1)
SODIUM: 134 mmol/L — AB (ref 135–145)
Total Bilirubin: 2 mg/dL — ABNORMAL HIGH (ref 0.3–1.2)
Total Protein: 5.5 g/dL — ABNORMAL LOW (ref 6.5–8.1)

## 2015-01-21 MED ORDER — TRAZODONE HCL 50 MG PO TABS
50.0000 mg | ORAL_TABLET | Freq: Every day | ORAL | Status: DC
  Administered 2015-01-21 – 2015-01-23 (×3): 50 mg via ORAL
  Filled 2015-01-21 (×3): qty 1

## 2015-01-21 MED ORDER — IBUPROFEN 200 MG PO TABS
600.0000 mg | ORAL_TABLET | Freq: Three times a day (TID) | ORAL | Status: DC | PRN
  Administered 2015-01-21 – 2015-01-24 (×2): 600 mg via ORAL
  Filled 2015-01-21 (×4): qty 3

## 2015-01-21 MED ORDER — FLUOXETINE HCL 20 MG PO CAPS
20.0000 mg | ORAL_CAPSULE | Freq: Every day | ORAL | Status: DC
Start: 2015-01-21 — End: 2015-01-24
  Administered 2015-01-21 – 2015-01-24 (×4): 20 mg via ORAL
  Filled 2015-01-21 (×4): qty 1

## 2015-01-21 NOTE — Progress Notes (Signed)
[facility]                                                                              Patient Demographics  Kara Stewart, is a 39 y.o. female, DOB - Oct 20, 1975, ZOX:[id number]  Admit date - 01/20/2015   Admitting Physician Jerald KiefStephen K Chiu, MD  Outpatient Primary MD for the patient is No PCP Per Patient  LOS -    Chief Complaint  Patient presents with  . Alcohol Problem       Brief HPI   Kara Stewart is a 39 y.o. female  With a hx of etoh abuse and anxiety who presents to the ED who presents with increased tremors while trying to detox herself from alcohol. Pt reported previously drinking 1pt of liquor daily, last intake was around 2pm on 10/15. Patient has been trying librium for symptoms with limited success. Patient reported that she had been trying to decrease her alcohol intake on her own. She reported night tremors, chills, nausea, vomiting, headache and abdominal pain. She felt dizzy while vomiting and hit her head. Patient subsequently presented to ED where pt was noted to have a high CIWA score, improved when CIWA protocol started. Hospitalist was requested for admission.   Assessment & Plan    Principal Problem:   Alcohol withdrawal (HCC) - Continue CIWA with scheduled Ativan - Continue IV fluids with thiamine and folate, MVI - Social worker consult for alcohol rehabilitation resources - Psychiatry consult called, discussed with Dr Addison NaegeliJonalagadda  Active Problems:   GERD (gastroesophageal reflux disease) - Continue PPI    Obesity (BMI 30-39.9) -Currently stable, patient counseled on diet and weight control     ETOH abuse - As #1    Anxiety, Depression - Psychiatry consult called    Marijuana abuse - Patient counseled on marijuana cessation  Code Status: Full code   Family Communication: Discussed in detail with the patient, all imaging results, lab results explained to the patientand husband at  bedside  Disposition Plan:Not medically ready   Time Spent in minutes   25 minutes  Procedures  none  Consults   Psych  DVT Prophylaxis SCD's  Medications  Scheduled Meds: . heparin  5,000 Units Subcutaneous 3 times per day  . LORazepam  0-4 mg Intravenous 4 times per day  . LORazepam  0-4 mg Intravenous Q12H  . LORazepam  0-4 mg Oral 4 times per day  . LORazepam  0-4 mg Oral Q12H  . pantoprazole  40 mg Oral Daily  . promethazine  12.5 mg Intravenous Once  . sodium chloride  3 mL Intravenous Q12H  . thiamine  100 mg Intravenous Daily  . thiamine  100 mg Oral Daily   Continuous Infusions:  PRN Meds:.ibuprofen, ondansetron **OR** ondansetron (ZOFRAN) IV, polyethylene glycol   Antibiotics   Anti-infectives    None        Subjective:   Kara Stewart was seen and examined today. Tearful, shaky, very anxious.  Patient denies dizziness, chest pain, shortness of breath, abdominal pain,  new weakness, numbess, tingling. No acute events overnight.  + Nausea improving  Objective:   Blood pressure 113/79, pulse  72, temperature 98.6 F (37 C), temperature source Oral, resp. rate 16, height  (1.626 m), weight 155 kg (341 lb 11.4 oz), last menstrual period 01/05/2015, SpO2 99 %.  Wt Readings from Last 3 Encounters:  01/20/15 155 kg (341 lb 11.4 oz)  01/08/15 71.305 kg (157 lb 3.2 oz)  12/27/14 73.483 kg (162 lb)     Intake/Output Summary (Last 24 hours) at 01/21/15 1234 Last data filed at 01/21/15 0700  Gross per 24 hour  Intake    360 ml  Output      0 ml  Net    360 ml    Exam  General: Alert and oriented x 3, NAD  HEENT:  PERRLA, EOMI, Anicteric Sclera, mucous membranes moist.   Neck: Supple, no JVD, no masses  CVS: S1 S2 auscultated, no rubs, murmurs or gallops. Regular rate and rhythm.  Respiratory: Clear to auscultation bilaterally, no wheezing, rales or rhonchi  Abdomen: Soft, nontender, nondistended, + bowel sounds  Ext: no  cyanosis clubbing or edema,  tremulous   Neuro: AAOx3, Cr N's II- XII. Strength 5/5 upper and lower extremities bilaterally  Skin: No rashes  Psych: Normal affect and demeanor, alert and oriented x3    Data Review   Micro Results No results found for this or any previous visit (from the past 240 hour(s)).  Radiology Reports Ct Head Wo Contrast  01/20/2015  CLINICAL DATA:  39 year old female with history of vomiting and dizziness. Patient reportedly fell and struck her frontal area on a cabinet. Alcohol abuse. EXAM: CT HEAD WITHOUT CONTRAST TECHNIQUE: Contiguous axial images were obtained from the base of the skull through the vertex without intravenous contrast. COMPARISON:  Head CT 10/26/2011. FINDINGS: No acute displaced skull fractures are identified. No acute intracranial abnormality. Specifically, no evidence of acute post-traumatic intracranial hemorrhage, no definite regions of acute/subacute cerebral ischemia, no focal mass, mass effect, hydrocephalus or abnormal intra or extra-axial fluid collections. The visualized paranasal sinuses and mastoids are well pneumatized. IMPRESSION: 1. No acute displaced skull fractures or acute intracranial abnormalities. 2. The appearance of the brain is normal. Electronically Signed   By: Trudie Reed M.D.   On: 01/20/2015 10:22   US Transvaginal Non-ob  01/03/2015  CLINICAL DATA:  Follow-up left ovarian cysts EXAM: TRANSABDOMINAL AND TRANSVAGINAL ULTRASOUND OF PELVIS TECHNIQUE: Both transabdominal and transvaginal ultrasound examinations of the pelvis were performed. Transabdominal technique was performed for global imaging of the pelvis including uterus, ovaries, adnexal regions, and pelvic cul-de-sac. It was necessary to proceed with endovaginal exam following the transabdominal exam to visualize the bilateral ovaries. COMPARISON:  CT abdomen pelvis dated 11/24/2014 FINDINGS: Uterus Measurements: 9.0 x 3.3 x 6.0 cm. No fibroids or other mass  visualized. Endometrium Thickness: 4 mm.  No focal abnormality visualized. Right ovary Measurements: 2.8 x 1.6 x 1.8 cm. Normal appearance/no adnexal mass. Left ovary Measurements: 3.2 x 1.7 x 2.0 cm. 1.4 x 1.2 x 1.1 cm hemorrhagic corpus luteal cyst. Other findings No free fluid. IMPRESSION: Negative pelvic ultrasound. Electronically Signed   By: Charline Bills M.D.   On: 01/03/2015 14:15   US Pelvis Complete  01/03/2015  CLINICAL DATA:  Follow-up left ovarian cysts EXAM: TRANSABDOMINAL AND TRANSVAGINAL ULTRASOUND OF PELVIS TECHNIQUE: Both transabdominal and transvaginal ultrasound examinations of the pelvis were performed. Transabdominal technique was performed for global imaging of the pelvis including uterus, ovaries, adnexal regions, and pelvic cul-de-sac. It was necessary to proceed with endovaginal exam following the transabdominal exam to visualize the bilateral ovaries.  COMPARISON:  CT abdomen pelvis dated 11/24/2014 FINDINGS: Uterus Measurements: 9.0 x 3.3 x 6.0 cm. No fibroids or other mass visualized. Endometrium Thickness: 4 mm.  No focal abnormality visualized. Right ovary Measurements: 2.8 x 1.6 x 1.8 cm. Normal appearance/no adnexal mass. Left ovary Measurements: 3.2 x 1.7 x 2.0 cm. 1.4 x 1.2 x 1.1 cm hemorrhagic corpus luteal cyst. Other findings No free fluid. IMPRESSION: Negative pelvic ultrasound. Electronically Signed   By: Charline Bills M.D.   On: 01/03/2015 14:15    CBC  Recent Labs Lab 01/20/15 0830 01/20/15 1724 01/21/15 0400  WBC 5.0 4.3 2.9*  HGB 14.3 12.6 12.1  HCT 40.1 36.6 35.3*  PLT 187 146* 127*  MCV 95.2 96.6 96.4  MCH 34.0 33.2 33.1  MCHC 35.7 34.4 34.3  RDW 12.5 12.4 12.5    Chemistries   Recent Labs Lab 01/20/15 0830 01/20/15 1724 01/21/15 0400  NA 139  --  134*  K 3.5  --  3.1*  CL 104  --  103  CO2 21*  --  23  GLUCOSE 85  --  86  BUN 8  --  <5*  CREATININE 0.76 0.71 0.72  CALCIUM 9.3  --  7.9*  MG  --  1.6*  --   AST 52*  --  44*   ALT 38  --  28  ALKPHOS 62  --  51  BILITOT 0.7  --  2.0*   ------------------------------------------------------------------------------------------------------------------ estimated creatinine clearance is 142.7 mL/min (by C-G formula based on Cr of 0.72). ------------------------------------------------------------------------------------------------------------------ No results for input(s): HGBA1C in the last 72 hours. ------------------------------------------------------------------------------------------------------------------ No results for input(s): CHOL, HDL, LDLCALC, TRIG, CHOLHDL, LDLDIRECT in the last 72 hours. ------------------------------------------------------------------------------------------------------------------ No results for input(s): TSH, T4TOTAL, T3FREE, THYROIDAB in the last 72 hours.  Invalid input(s): FREET3 ------------------------------------------------------------------------------------------------------------------ No results for input(s): VITAMINB12, FOLATE, FERRITIN, TIBC, IRON, RETICCTPCT in the last 72 hours.  Coagulation profile No results for input(s): INR, PROTIME in the last 168 hours.  No results for input(s): DDIMER in the last 72 hours.  Cardiac Enzymes No results for input(s): CKMB, TROPONINI, MYOGLOBIN in the last 168 hours.  Invalid input(s): CK ------------------------------------------------------------------------------------------------------------------ Invalid input(s): POCBNP  No results for input(s): GLUCAP in the last 72 hours.   Shena Vinluan M.D. [facility] 01/21/2015, 12:34 PM  Pager: [phone] Between 7am to 7pm - call Pager - [phone]  After 7pm go to [email or web address] - password [email or web address]  Call night coverage person covering after 7pm
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
## 2015-01-22 ENCOUNTER — Observation Stay (HOSPITAL_COMMUNITY): Payer: Self-pay

## 2015-01-22 ENCOUNTER — Observation Stay (HOSPITAL_COMMUNITY): Payer: MEDICAID

## 2015-01-22 ENCOUNTER — Encounter (HOSPITAL_COMMUNITY): Payer: Self-pay | Admitting: Radiology

## 2015-01-22 MED ORDER — SODIUM CHLORIDE 0.9 % IV SOLN
INTRAVENOUS | Status: DC
  Administered 2015-01-22 – 2015-01-23 (×2): via INTRAVENOUS

## 2015-01-22 MED ORDER — HYDROMORPHONE HCL 1 MG/ML IJ SOLN
1.0000 mg | Freq: Once | INTRAMUSCULAR | Status: AC
  Administered 2015-01-22: 1 mg via INTRAVENOUS
  Filled 2015-01-22: qty 1

## 2015-01-22 MED ORDER — PROMETHAZINE HCL 25 MG/ML IJ SOLN
25.0000 mg | Freq: Once | INTRAMUSCULAR | Status: AC
  Administered 2015-01-22: 25 mg via INTRAVENOUS

## 2015-01-22 MED ORDER — LORAZEPAM 2 MG/ML IJ SOLN: 1.0000 mg | Freq: Four times a day (QID) | INTRAMUSCULAR | Status: DC | PRN

## 2015-01-22 MED ORDER — LORAZEPAM 1 MG PO TABS
1.0000 mg | ORAL_TABLET | Freq: Four times a day (QID) | ORAL | Status: DC | PRN
  Administered 2015-01-23 (×2): 1 mg via ORAL
  Filled 2015-01-22: qty 2
  Filled 2015-01-22: qty 1

## 2015-01-22 MED ORDER — IOHEXOL 300 MG/ML  SOLN
25.0000 mL | INTRAMUSCULAR | Status: AC
  Administered 2015-01-22 (×2): 25 mL via ORAL

## 2015-01-22 MED ORDER — HYDROMORPHONE HCL 1 MG/ML IJ SOLN
0.5000 mg | INTRAMUSCULAR | Status: DC | PRN
  Administered 2015-01-23: 0.5 mg via INTRAVENOUS
  Filled 2015-01-22: qty 1

## 2015-01-22 MED ORDER — IOHEXOL 300 MG/ML  SOLN
100.0000 mL | Freq: Once | INTRAMUSCULAR | Status: AC | PRN
  Administered 2015-01-22: 100 mL via INTRAVENOUS

## 2015-01-22 MED ORDER — ALUM & MAG HYDROXIDE-SIMETH 200-200-20 MG/5ML PO SUSP
30.0000 mL | Freq: Once | ORAL | Status: AC
  Administered 2015-01-22: 30 mL via ORAL
  Filled 2015-01-22: qty 30

## 2015-01-22 NOTE — Progress Notes (Signed)
Pt c/o lower abdominal cramping after eating breakfast this am. Dr Isidoro Donningai notified. Dr Isidoro Donningai ordered to change diet to clear liquids, give 1 mg Dilaudid x1 and Dr Isidoro Donningai will order CT of abdomen. No other complaints at this time per pt. Bed remains in lowest position and call bell is within reach. Will continue to monitor pt frequently throughout day.

## 2015-01-22 NOTE — Progress Notes (Signed)
[facility]                                                                              Patient Demographics  Kara Stewart, is a 39 y.o. female, DOB - May 19, 1975, KVQ:[id number]  Admit date - 01/20/2015   Admitting Physician Jerald KiefStephen K Chiu, MD  Outpatient Primary MD for the patient is No PCP Per Patient  LOS -    Chief Complaint  Patient presents with  . Alcohol Problem       Brief HPI   Kara Stewart is a 39 y.o. female  With a hx of etoh abuse and anxiety who presents to the ED who presents with increased tremors while trying to detox herself from alcohol. Pt reported previously drinking 1pt of liquor daily, last intake was around 2pm on 10/15. Patient has been trying librium for symptoms with limited success. Patient reported that she had been trying to decrease her alcohol intake on her own. She reported night tremors, chills, nausea, vomiting, headache and abdominal pain. She felt dizzy while vomiting and hit her head. Patient subsequently presented to ED where pt was noted to have a high CIWA score, improved when CIWA protocol started. Hospitalist was requested for admission.   Assessment & Plan    Principal Problem:   Alcohol withdrawal (HCC) - Continue CIWA with scheduled Ativan - Continue IV fluids with thiamine and folate, MVI - Child psychotherapistocial worker consult for alcohol rehabilitation resources  Active Problems: Abdominal cramps - Patient complaining of abdominal cramping after eating, stat abdominal x-ray negative for acute abdominal pathology - Placed on clear liquid diet, IV fluids, IV pain medications - Discussed with the patient, if she is not able to tolerate clear liquid diet, will obtain CT of the abdomen    GERD (gastroesophageal reflux disease) - Continue PPI    Obesity (BMI 30-39.9) -Currently stable, patient counseled on diet and weight control     ETOH abuse - As #1    Anxiety, Depression - Psychiatry  consulted, appreciate recommendations, started on trazodone, Prozac    Marijuana abuse - Patient counseled on marijuana cessation  Code Status: Full code   Family Communication: Discussed in detail with the patient, all imaging results, lab results explained to the patient  Disposition Plan:Not medically ready   Time Spent in minutes   25 minutes  Procedures  none  Consults   Psych  DVT Prophylaxis SCD's  Medications  Scheduled Meds: . FLUoxetine  20 mg Oral Daily  . heparin  5,000 Units Subcutaneous 3 times per day  . LORazepam  0-4 mg Oral Q12H  . pantoprazole  40 mg Oral Daily  . promethazine  12.5 mg Intravenous Once  . sodium chloride  3 mL Intravenous Q12H  . thiamine  100 mg Oral Daily  . traZODone  50 mg Oral QHS   Continuous Infusions:  PRN Meds:.ibuprofen, LORazepam **OR** LORazepam, ondansetron **OR** ondansetron (ZOFRAN) IV, polyethylene glycol   Antibiotics   Anti-infectives    None        Subjective:   Kara Stewart was seen and examined today. Complaining of lower abdominal cramps after eating. Still somewhat anxious  and shaky.  Patient denies dizziness, chest pain, shortness of breath, new weakness, numbess, tingling. No acute events overnight.  + Nausea improving  Objective:   Blood pressure 112/65, pulse 82, temperature 98.4 F (36.9 C), temperature source Oral, resp. rate 18, height  (1.626 m), weight 155 kg (341 lb 11.4 oz), last menstrual period 01/05/2015, SpO2 98 %.  Wt Readings from Last 3 Encounters:  01/20/15 155 kg (341 lb 11.4 oz)  01/08/15 71.305 kg (157 lb 3.2 oz)  12/27/14 73.483 kg (162 lb)     Intake/Output Summary (Last 24 hours) at 01/22/15 1236 Last data filed at 01/22/15 0600  Gross per 24 hour  Intake    720 ml  Output      0 ml  Net    720 ml    Exam  General: Alert and oriented x 3, NAD  HEENT:  PERRLA, EOMI, Anicteric Sclera, mucous membranes moist.   Neck: Supple, no JVD, no  masses  CVS: S1 S2 clear, RRR  Respiratory: CTAB  Abdomen: Soft, nontender, nondistended, + bowel sounds  Ext: no cyanosis clubbing or edema,  tremulous   Neuro: no new deficits  Skin: No rashes  Psych: Normal affect and demeanor, alert and oriented x3    Data Review   Micro Results No results found for this or any previous visit (from the past 240 hour(s)).  Radiology Reports Ct Head Wo Contrast  01/20/2015  CLINICAL DATA:  39 year old female with history of vomiting and dizziness. Patient reportedly fell and struck her frontal area on a cabinet. Alcohol abuse. EXAM: CT HEAD WITHOUT CONTRAST TECHNIQUE: Contiguous axial images were obtained from the base of the skull through the vertex without intravenous contrast. COMPARISON:  Head CT 10/26/2011. FINDINGS: No acute displaced skull fractures are identified. No acute intracranial abnormality. Specifically, no evidence of acute post-traumatic intracranial hemorrhage, no definite regions of acute/subacute cerebral ischemia, no focal mass, mass effect, hydrocephalus or abnormal intra or extra-axial fluid collections. The visualized paranasal sinuses and mastoids are well pneumatized. IMPRESSION: 1. No acute displaced skull fractures or acute intracranial abnormalities. 2. The appearance of the brain is normal. Electronically Signed   By: Trudie Reed M.D.   On: 01/20/2015 10:22   US Transvaginal Non-ob  01/03/2015  CLINICAL DATA:  Follow-up left ovarian cysts EXAM: TRANSABDOMINAL AND TRANSVAGINAL ULTRASOUND OF PELVIS TECHNIQUE: Both transabdominal and transvaginal ultrasound examinations of the pelvis were performed. Transabdominal technique was performed for global imaging of the pelvis including uterus, ovaries, adnexal regions, and pelvic cul-de-sac. It was necessary to proceed with endovaginal exam following the transabdominal exam to visualize the bilateral ovaries. COMPARISON:  CT abdomen pelvis dated 11/24/2014 FINDINGS: Uterus  Measurements: 9.0 x 3.3 x 6.0 cm. No fibroids or other mass visualized. Endometrium Thickness: 4 mm.  No focal abnormality visualized. Right ovary Measurements: 2.8 x 1.6 x 1.8 cm. Normal appearance/no adnexal mass. Left ovary Measurements: 3.2 x 1.7 x 2.0 cm. 1.4 x 1.2 x 1.1 cm hemorrhagic corpus luteal cyst. Other findings No free fluid. IMPRESSION: Negative pelvic ultrasound. Electronically Signed   By: Charline Bills M.D.   On: 01/03/2015 14:15   US Pelvis Complete  01/03/2015  CLINICAL DATA:  Follow-up left ovarian cysts EXAM: TRANSABDOMINAL AND TRANSVAGINAL ULTRASOUND OF PELVIS TECHNIQUE: Both transabdominal and transvaginal ultrasound examinations of the pelvis were performed. Transabdominal technique was performed for global imaging of the pelvis including uterus, ovaries, adnexal regions, and pelvic cul-de-sac. It was necessary to proceed with endovaginal exam following the transabdominal  exam to visualize the bilateral ovaries. COMPARISON:  CT abdomen pelvis dated 11/24/2014 FINDINGS: Uterus Measurements: 9.0 x 3.3 x 6.0 cm. No fibroids or other mass visualized. Endometrium Thickness: 4 mm.  No focal abnormality visualized. Right ovary Measurements: 2.8 x 1.6 x 1.8 cm. Normal appearance/no adnexal mass. Left ovary Measurements: 3.2 x 1.7 x 2.0 cm. 1.4 x 1.2 x 1.1 cm hemorrhagic corpus luteal cyst. Other findings No free fluid. IMPRESSION: Negative pelvic ultrasound. Electronically Signed   By: Charline Bills M.D.   On: 01/03/2015 14:15   Dg Abd 2 Views  01/22/2015  CLINICAL DATA:  Abdominal pain.  History colitis EXAM: ABDOMEN - 2 VIEW COMPARISON:  CT abdomen 11/24/2014 FINDINGS: The bowel gas pattern is normal. There is no evidence of free air. No radio-opaque calculi or other significant radiographic abnormality is seen. IMPRESSION: Negative. Electronically Signed   By: Marlan Palau M.D.   On: 01/22/2015 11:18    CBC  Recent Labs Lab 01/20/15 0830 01/20/15 1724 01/21/15 0400  WBC  5.0 4.3 2.9*  HGB 14.3 12.6 12.1  HCT 40.1 36.6 35.3*  PLT 187 146* 127*  MCV 95.2 96.6 96.4  MCH 34.0 33.2 33.1  MCHC 35.7 34.4 34.3  RDW 12.5 12.4 12.5    Chemistries   Recent Labs Lab 01/20/15 0830 01/20/15 1724 01/21/15 0400  NA 139  --  134*  K 3.5  --  3.1*  CL 104  --  103  CO2 21*  --  23  GLUCOSE 85  --  86  BUN 8  --  <5*  CREATININE 0.76 0.71 0.72  CALCIUM 9.3  --  7.9*  MG  --  1.6*  --   AST 52*  --  44*  ALT 38  --  28  ALKPHOS 62  --  51  BILITOT 0.7  --  2.0*   ------------------------------------------------------------------------------------------------------------------ estimated creatinine clearance is 142.7 mL/min (by C-G formula based on Cr of 0.72). ------------------------------------------------------------------------------------------------------------------ No results for input(s): HGBA1C in the last 72 hours. ------------------------------------------------------------------------------------------------------------------ No results for input(s): CHOL, HDL, LDLCALC, TRIG, CHOLHDL, LDLDIRECT in the last 72 hours. ------------------------------------------------------------------------------------------------------------------ No results for input(s): TSH, T4TOTAL, T3FREE, THYROIDAB in the last 72 hours.  Invalid input(s): FREET3 ------------------------------------------------------------------------------------------------------------------ No results for input(s): VITAMINB12, FOLATE, FERRITIN, TIBC, IRON, RETICCTPCT in the last 72 hours.  Coagulation profile No results for input(s): INR, PROTIME in the last 168 hours.  No results for input(s): DDIMER in the last 72 hours.  Cardiac Enzymes No results for input(s): CKMB, TROPONINI, MYOGLOBIN in the last 168 hours.  Invalid input(s): CK ------------------------------------------------------------------------------------------------------------------ Invalid input(s): POCBNP  No  results for input(s): GLUCAP in the last 72 hours.   Jowana Thumma M.D. [facility] 01/22/2015, 12:36 PM  Pager: [phone] Between 7am to 7pm - call Pager - [phone]  After 7pm go to [email or web address] - password [email or web address]  Call night coverage person covering after 7pm

## 2015-01-23 DIAGNOSIS — R112 Nausea with vomiting, unspecified: Secondary | ICD-10-CM

## 2015-01-23 LAB — BASIC METABOLIC PANEL
Anion gap: 8 (ref 5–15)
CHLORIDE: 105 mmol/L (ref 101–111)
CO2: 26 mmol/L (ref 22–32)
CREATININE: 0.71 mg/dL (ref 0.44–1.00)
Calcium: 8.5 mg/dL — ABNORMAL LOW (ref 8.9–10.3)
GFR calc Af Amer: >60 mL/min (ref >60)
GFR calc non Af Amer: >60 mL/min (ref >60)
GLUCOSE: 88 mg/dL (ref 65–99)
POTASSIUM: 3.3 mmol/L — AB (ref 3.5–5.1)
Sodium: 139 mmol/L (ref 135–145)

## 2015-01-23 LAB — MAGNESIUM: MAGNESIUM: 1.8 mg/dL (ref 1.7–2.4)

## 2015-01-23 MED ORDER — POTASSIUM CHLORIDE 10 MEQ/100ML IV SOLN
10.0000 meq | INTRAVENOUS | Status: AC
  Administered 2015-01-23: 10 meq via INTRAVENOUS
  Filled 2015-01-23 (×2): qty 100

## 2015-01-23 MED ORDER — ALUM & MAG HYDROXIDE-SIMETH 200-200-20 MG/5ML PO SUSP
30.0000 mL | Freq: Once | ORAL | Status: AC
  Administered 2015-01-23: 30 mL via ORAL
  Filled 2015-01-23: qty 30

## 2015-01-23 MED ORDER — POTASSIUM CHLORIDE 10 MEQ/100ML IV SOLN
10.0000 meq | INTRAVENOUS | Status: DC
  Filled 2015-01-23 (×3): qty 100

## 2015-01-23 MED ORDER — MAGNESIUM SULFATE IN D5W 10-5 MG/ML-% IV SOLN
1.0000 g | Freq: Once | INTRAVENOUS | Status: AC
  Administered 2015-01-23: 1 g via INTRAVENOUS
  Filled 2015-01-23: qty 100

## 2015-01-23 NOTE — Progress Notes (Signed)
[facility]                                                                              Patient Demographics  Kara Stewart, is a 39 y.o. female, DOB - 1975-10-30, ZOX:[id number]  Admit date - 01/20/2015   Admitting Physician Jerald Kief, MD  Outpatient Primary MD for the patient is No PCP Per Patient  LOS -    Chief Complaint  Patient presents with  . Alcohol Problem       Brief HPI   Kara Stewart is a 39 y.o. female  With a hx of etoh abuse and anxiety who presents to the ED who presents with increased tremors while trying to detox herself from alcohol. Pt reported previously drinking 1pt of liquor daily, last intake was around 2pm on 10/15. Patient has been trying librium for symptoms with limited success. Patient reported that she had been trying to decrease her alcohol intake on her own. She reported night tremors, chills, nausea, vomiting, headache and abdominal pain. She felt dizzy while vomiting and hit her head. Patient subsequently presented to ED where pt was noted to have a high CIWA score, improved when CIWA protocol started. Hospitalist was requested for admission.   Assessment & Plan      Alcohol withdrawal (HCC) - Continue CIWA but d/c scheduled Ativan - Continue IV fluids with thiamine and folate, MVI - Child psychotherapist consult for alcohol rehabilitation resources -psych consult  Abdominal cramps - Patient complaining of abdominal cramping after eating, stat abdominal x-ray negative for acute abdominal pathology - Placed on clear liquid diet, IV fluids, IV pain medications -  CT of the abdomen ok -advance diet as tolerated    GERD (gastroesophageal reflux disease) - Continue PPI    Obesity (BMI 30-39.9) -Currently stable, patient counseled on diet and weight control     ETOH abuse - As #1    Anxiety, Depression - Psychiatry consulted, appreciate recommendations, started on trazodone, Prozac    Marijuana  abuse - Patient counseled on marijuana cessation  -recheck labs today and replace Mg and K as needed    Code Status: Full code   Family Communication: patient  Disposition Plan:Not medically ready -- d/c when able to eat  Time Spent in minutes   25 minutes  Procedures  none  Consults   Psych   DVT Prophylaxis SCD's  Medications  Scheduled Meds: . FLUoxetine  20 mg Oral Daily  . heparin  5,000 Units Subcutaneous 3 times per day  . pantoprazole  40 mg Oral Daily  . potassium chloride  10 mEq Intravenous Q1 Hr x 3  . sodium chloride  3 mL Intravenous Q12H  . thiamine  100 mg Oral Daily  . traZODone  50 mg Oral QHS   Continuous Infusions: . sodium chloride 75 mL/hr at 01/23/15 0355   PRN Meds:.HYDROmorphone (DILAUDID) injection, ibuprofen, LORazepam **OR** LORazepam, ondansetron **OR** ondansetron (ZOFRAN) IV, polyethylene glycol   Antibiotics   Anti-infectives    None        Subjective:   Kara Stewart has still not been able to tolerate a liquid diet  Objective:  Blood pressure 98/53, pulse 77, temperature 98.2 F (36.8 C), temperature source Oral, resp. rate 18, height  (1.626 m), weight 155 kg (341 lb 11.4 oz), last menstrual period 01/05/2015, SpO2 99 %.  Wt Readings from Last 3 Encounters:  01/20/15 155 kg (341 lb 11.4 oz)  01/08/15 71.305 kg (157 lb 3.2 oz)  12/27/14 73.483 kg (162 lb)     Intake/Output Summary (Last 24 hours) at 01/23/15 4098 Last data filed at 01/22/15 1700  Gross per 24 hour  Intake    480 ml  Output      0 ml  Net    480 ml    Exam  General: Alert and oriented x 3, NAD  CVS: S1 S2 clear, RRR  Respiratory: CTAB  Abdomen: Soft, nontender, nondistended, + bowel sounds  Ext: no cyanosis clubbing or edema,  tremulous   Neuro: no new deficits  Skin: No rashes  Psych: Normal affect and demeanor, alert and oriented x3    Data Review   Micro Results No results found for this or any previous  visit (from the past 240 hour(s)).  Radiology Reports Ct Head Wo Contrast  01/20/2015  CLINICAL DATA:  39 year old female with history of vomiting and dizziness. Patient reportedly fell and struck her frontal area on a cabinet. Alcohol abuse. EXAM: CT HEAD WITHOUT CONTRAST TECHNIQUE: Contiguous axial images were obtained from the base of the skull through the vertex without intravenous contrast. COMPARISON:  Head CT 10/26/2011. FINDINGS: No acute displaced skull fractures are identified. No acute intracranial abnormality. Specifically, no evidence of acute post-traumatic intracranial hemorrhage, no definite regions of acute/subacute cerebral ischemia, no focal mass, mass effect, hydrocephalus or abnormal intra or extra-axial fluid collections. The visualized paranasal sinuses and mastoids are well pneumatized. IMPRESSION: 1. No acute displaced skull fractures or acute intracranial abnormalities. 2. The appearance of the brain is normal. Electronically Signed   By: Trudie Reed M.D.   On: 01/20/2015 10:22   US Transvaginal Non-ob  01/03/2015  CLINICAL DATA:  Follow-up left ovarian cysts EXAM: TRANSABDOMINAL AND TRANSVAGINAL ULTRASOUND OF PELVIS TECHNIQUE: Both transabdominal and transvaginal ultrasound examinations of the pelvis were performed. Transabdominal technique was performed for global imaging of the pelvis including uterus, ovaries, adnexal regions, and pelvic cul-de-sac. It was necessary to proceed with endovaginal exam following the transabdominal exam to visualize the bilateral ovaries. COMPARISON:  CT abdomen pelvis dated 11/24/2014 FINDINGS: Uterus Measurements: 9.0 x 3.3 x 6.0 cm. No fibroids or other mass visualized. Endometrium Thickness: 4 mm.  No focal abnormality visualized. Right ovary Measurements: 2.8 x 1.6 x 1.8 cm. Normal appearance/no adnexal mass. Left ovary Measurements: 3.2 x 1.7 x 2.0 cm. 1.4 x 1.2 x 1.1 cm hemorrhagic corpus luteal cyst. Other findings No free fluid.  IMPRESSION: Negative pelvic ultrasound. Electronically Signed   By: Charline Bills M.D.   On: 01/03/2015 14:15   US Pelvis Complete  01/03/2015  CLINICAL DATA:  Follow-up left ovarian cysts EXAM: TRANSABDOMINAL AND TRANSVAGINAL ULTRASOUND OF PELVIS TECHNIQUE: Both transabdominal and transvaginal ultrasound examinations of the pelvis were performed. Transabdominal technique was performed for global imaging of the pelvis including uterus, ovaries, adnexal regions, and pelvic cul-de-sac. It was necessary to proceed with endovaginal exam following the transabdominal exam to visualize the bilateral ovaries. COMPARISON:  CT abdomen pelvis dated 11/24/2014 FINDINGS: Uterus Measurements: 9.0 x 3.3 x 6.0 cm. No fibroids or other mass visualized. Endometrium Thickness: 4 mm.  No focal abnormality visualized. Right ovary Measurements: 2.8 x 1.6 x 1.8  cm. Normal appearance/no adnexal mass. Left ovary Measurements: 3.2 x 1.7 x 2.0 cm. 1.4 x 1.2 x 1.1 cm hemorrhagic corpus luteal cyst. Other findings No free fluid. IMPRESSION: Negative pelvic ultrasound. Electronically Signed   By: Charline BillsSriyesh  Krishnan M.D.   On: 01/03/2015 14:15   Ct Abdomen Pelvis W Contrast  01/22/2015  CLINICAL DATA:  Lower abdominal pain, nausea and vomiting for 2 days. Reported history of alcoholic hepatitis and colitis. EXAM: CT ABDOMEN AND PELVIS WITH CONTRAST TECHNIQUE: Multidetector CT imaging of the abdomen and pelvis was performed using the standard protocol following bolus administration of intravenous contrast. CONTRAST:  100mL OMNIPAQUE IOHEXOL 300 MG/ML  SOLN COMPARISON:  11/24/2014 CT abdomen/pelvis. FINDINGS: Lower chest: Right middle lobe 3 mm solid pulmonary nodule (series 3/ image 2), stable since 08/07/2012, suggesting benign etiology. Hepatobiliary: Normal liver with no liver mass. Normal gallbladder with no radiopaque cholelithiasis. No biliary ductal dilatation. Pancreas: Normal, with no mass or duct dilation. Spleen: Mild  splenomegaly (craniocaudal splenic length 14.0 cm, increased from 13.2 cm). No splenic mass. Adrenals/Urinary Tract: Normal adrenals. Normal kidneys with no hydronephrosis and no renal mass. Normal bladder. Stomach/Bowel: Grossly normal stomach. Normal caliber small bowel with no small bowel wall thickening. Normal appendix. Normal large bowel with no diverticulosis, large bowel wall thickening or pericolonic fat stranding. Vascular/Lymphatic: Normal caliber abdominal aorta. Patent portal, splenic, hepatic and renal veins. No pathologically enlarged lymph nodes in the abdomen or pelvis. Reproductive: Grossly normal anteverted uterus. There are simple ovarian cysts bilaterally measuring 2.6 cm on the right and 2.9 cm on the left, in keeping with physiologic ovarian cysts. Other: No pneumoperitoneum. No focal intra-abdominal fluid collection. Trace simple free fluid in the pelvic cul-de-sac. Musculoskeletal: No aggressive appearing focal osseous lesions. IMPRESSION: 1. New mild splenomegaly.  No abdominopelvic lymphadenopathy. 2. No evidence of bowel obstruction or acute bowel inflammation. Normal appendix. Electronically Signed   By: Delbert PhenixJason A Poff M.D.   On: 01/22/2015 18:06   Dg Abd 2 Views  01/22/2015  CLINICAL DATA:  Abdominal pain.  History colitis EXAM: ABDOMEN - 2 VIEW COMPARISON:  CT abdomen 11/24/2014 FINDINGS: The bowel gas pattern is normal. There is no evidence of free air. No radio-opaque calculi or other significant radiographic abnormality is seen. IMPRESSION: Negative. Electronically Signed   By: Marlan Palauharles  Clark M.D.   On: 01/22/2015 11:18    CBC  Recent Labs Lab 01/20/15 0830 01/20/15 1724 01/21/15 0400  WBC 5.0 4.3 2.9*  HGB 14.3 12.6 12.1  HCT 40.1 36.6 35.3*  PLT 187 146* 127*  MCV 95.2 96.6 96.4  MCH 34.0 33.2 33.1  MCHC 35.7 34.4 34.3  RDW 12.5 12.4 12.5    Chemistries   Recent Labs Lab 01/20/15 0830 01/20/15 1724 01/21/15 0400  NA 139  --  134*  K 3.5  --  3.1*  CL  104  --  103  CO2 21*  --  23  GLUCOSE 85  --  86  BUN 8  --  <5*  CREATININE 0.76 0.71 0.72  CALCIUM 9.3  --  7.9*  MG  --  1.6*  --   AST 52*  --  44*  ALT 38  --  28  ALKPHOS 62  --  51  BILITOT 0.7  --  2.0*   ------------------------------------------------------------------------------------------------------------------ estimated creatinine clearance is 142.7 mL/min (by C-G formula based on Cr of 0.72). ------------------------------------------------------------------------------------------------------------------ No results for input(s): HGBA1C in the last 72 hours. ------------------------------------------------------------------------------------------------------------------ No results for input(s): CHOL, HDL, LDLCALC, TRIG, CHOLHDL,  LDLDIRECT in the last 72 hours. ------------------------------------------------------------------------------------------------------------------ No results for input(s): TSH, T4TOTAL, T3FREE, THYROIDAB in the last 72 hours.  Invalid input(s): FREET3 ------------------------------------------------------------------------------------------------------------------ No results for input(s): VITAMINB12, FOLATE, FERRITIN, TIBC, IRON, RETICCTPCT in the last 72 hours.  Coagulation profile No results for input(s): INR, PROTIME in the last 168 hours.  No results for input(s): DDIMER in the last 72 hours.  Cardiac Enzymes No results for input(s): CKMB, TROPONINI, MYOGLOBIN in the last 168 hours.  Invalid input(s): CK ------------------------------------------------------------------------------------------------------------------ Invalid input(s): POCBNP  No results for input(s): GLUCAP in the last 72 hours.   Natthew Marlatt DO. [facility] 01/23/2015, 8:22 AM  Pager: [phone] Between 7am to 7pm - call Pager - [phone]  After 7pm go to [email or web address] - password [email or web address]  Call night coverage person covering after 7pm
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
## 2015-01-24 DIAGNOSIS — F10239 Alcohol dependence with withdrawal, unspecified: Secondary | ICD-10-CM

## 2015-01-24 LAB — C DIFFICILE QUICK SCREEN W PCR REFLEX
C Diff antigen: POSITIVE — AB
C Diff toxin: NEGATIVE

## 2015-01-24 MED ORDER — TRAZODONE HCL 50 MG PO TABS: 50.0000 mg | ORAL_TABLET | Freq: Every day | ORAL | Status: DC

## 2015-01-24 MED ORDER — FLUOXETINE HCL 20 MG PO CAPS: 20.0000 mg | ORAL_CAPSULE | Freq: Every day | ORAL | Status: DC

## 2015-01-24 MED ORDER — LORAZEPAM 1 MG PO TABS: 1.0000 mg | ORAL_TABLET | Freq: Four times a day (QID) | ORAL | Status: DC | PRN

## 2015-01-24 NOTE — Discharge Summary (Signed)
Physician Discharge Summary  Kara Stewart ZOX:[id number] DOB: [birth year] DOA: 01/20/2015  PCP: No PCP Per Patient  Admit date: 01/20/2015 Discharge date: 01/24/2015  Time spent: greater than 30 minutes  Recommendations for Outpatient Follow-up:  1. Outpatient alcohol treatment referral at [facility]  Discharge Diagnoses:  Principal Problem:   Alcohol withdrawal (HCC) Active Problems:   GERD (gastroesophageal reflux disease)   Obesity (BMI 30-39.9)   ETOH abuse   Anxiety   Marijuana abuse   Discharge Condition: stable  Diet recommendation: general  Filed Weights   01/20/15 8119 01/20/15 1803  Weight: 70.308 kg (155 lb) 155 kg (341 lb 11.4 oz)    History of present illness/Hospital Course:  Kara Stewart is a 39 y.o. female  With a hx of etoh abuse and anxiety who presents to the ED who presents with increased tremors while trying to detox herself from alcohol. Pt reported previously drinking 1pt of liquor daily, last intake was around 2pm on 10/15. Patient has been trying librium for symptoms with limited success. Patient reported that she had been trying to decrease her alcohol intake on her own. She reported night tremors, chills, nausea, vomiting, headache and abdominal pain. She felt dizzy while vomiting and hit her head. Patient subsequently presented to ED where pt was noted to have a high CIWA score, improved when CIWA protocol started. Hospitalist was requested for admission.    Alcohol withdrawal (HCC) Ativan CIWA protocol IV fluids with thiamine and folate, MVI - Social worker consult for alcohol rehabilitation resources -psych consult. By discharge CIWA scores low  Abdominal cramps - Patient complaining of abdominal cramping after eating, stat abdominal x-ray negative for acute abdominal pathology - Placed on clear liquid diet, IV fluids, IV pain medications - CT of the abdomen ok By discharge tolerating regular diet   GERD  (gastroesophageal reflux disease) - Continue PPI   Anxiety, Depression - Psychiatry consulted, appreciate recommendations, started on trazodone, Prozac   Marijuana abuse - Patient counseled         Procedures:  none  Consultations:  psychiatry  Discharge Exam: Filed Vitals:   01/24/15 0500  BP: 100/68  Pulse: 82  Temp: 99.3 F (37.4 C)  Resp: 20    General: a and o Cardiovascular: RRR Respiratory: CTA abd s, nt, nd Ext no CCE  Discharge Instructions   Discharge Instructions    Activity as tolerated - No restrictions    Complete by:  As directed      Diet general    Complete by:  As directed           Current Discharge Medication List    START taking these medications   Details  FLUoxetine (PROZAC) 20 MG capsule Take 1 capsule (20 mg total) by mouth daily. Qty: 30 capsule, Refills: 1    LORazepam (ATIVAN) 1 MG tablet Take 1 tablet (1 mg total) by mouth every 6 (six) hours as needed (tremulousness, agitation). Qty: 15 tablet, Refills: 0    traZODone (DESYREL) 50 MG tablet Take 1 tablet (50 mg total) by mouth at bedtime. Qty: 30 tablet, Refills: 1      CONTINUE these medications which have NOT CHANGED   Details  metoprolol tartrate (LOPRESSOR) 25 MG tablet Take 12.5 mg by mouth as needed (for palpitations).     sucralfate (CARAFATE) 1 G tablet Take 1 tablet (1 g total) by mouth 4 (four) times daily -  with meals and at bedtime. As needed for abdominal burning. Qty: 120 tablet, Refills: 1  STOP taking these medications     chlordiazePOXIDE (LIBRIUM) 25 MG capsule        Allergies  Allergen Reactions  . Penicillins Anaphylaxis  . Lidocaine Other (See Comments)    hallucinations   Follow-up Information    Follow up with [facility].       The results of significant diagnostics from this hospitalization (including imaging, microbiology, ancillary and laboratory) are listed below for reference.    Significant Diagnostic Studies: Ct  Head Wo Contrast  01/20/2015  CLINICAL DATA:  39 year old female with history of vomiting and dizziness. Patient reportedly fell and struck her frontal area on a cabinet. Alcohol abuse. EXAM: CT HEAD WITHOUT CONTRAST TECHNIQUE: Contiguous axial images were obtained from the base of the skull through the vertex without intravenous contrast. COMPARISON:  Head CT 10/26/2011. FINDINGS: No acute displaced skull fractures are identified. No acute intracranial abnormality. Specifically, no evidence of acute post-traumatic intracranial hemorrhage, no definite regions of acute/subacute cerebral ischemia, no focal mass, mass effect, hydrocephalus or abnormal intra or extra-axial fluid collections. The visualized paranasal sinuses and mastoids are well pneumatized. IMPRESSION: 1. No acute displaced skull fractures or acute intracranial abnormalities. 2. The appearance of the brain is normal. Electronically Signed   By: Daniel  Entrikin M.D.   On: 01/20/2015 10:22   Us Transvaginal Non-ob  01/03/2015  CLINICAL DATA:  Follow-up left ovarian cysts EXAM: TRANSABDOMINAL AND TRANSVAGINAL ULTRASOUND OF PELVIS TECHNIQUE: Both transabdominal and transvaginal ultrasound examinations of the pelvis were performed. Transabdominal technique was performed for global imaging of the pelvis including uterus, ovaries, adnexal regions, and pelvic cul-de-sac. It was necessary to proceed with endovaginal exam following the transabdominal exam to visualize the bilateral ovaries. COMPARISON:  CT abdomen pelvis dated 11/24/2014 FINDINGS: Uterus Measurements: 9.0 x 3.3 x 6.0 cm. No fibroids or other mass visualized. Endometrium Thickness: 4 mm.  No focal abnormality visualized. Right ovary Measurements: 2.8 x 1.6 x 1.8 cm. Normal appearance/no adnexal mass. Left ovary Measurements: 3.2 x 1.7 x 2.0 cm. 1.4 x 1.2 x 1.1 cm hemorrhagic corpus luteal cyst. Other findings No free fluid. IMPRESSION: Negative pelvic ultrasound. Electronically Signed   By:  Sriyesh  Krishnan M.D.   On: 01/03/2015 14:15   Us Pelvis Complete  01/03/2015  CLINICAL DATA:  Follow-up left ovarian cysts EXAM: TRANSABDOMINAL AND TRANSVAGINAL ULTRASOUND OF PELVIS TECHNIQUE: Both transabdominal and transvaginal ultrasound examinations of the pelvis were performed. Transabdominal technique was performed for global imaging of the pelvis including uterus, ovaries, adnexal regions, and pelvic cul-de-sac. It was necessary to proceed with endovaginal exam following the transabdominal exam to visualize the bilateral ovaries. COMPARISON:  CT abdomen pelvis dated 11/24/2014 FINDINGS: Uterus Measurements: 9.0 x 3.3 x 6.0 cm. No fibroids or other mass visualized. Endometrium Thickness: 4 mm.  No focal abnormality visualized. Right ovary Measurements: 2.8 x 1.6 x 1.8 cm. Normal appearance/no adnexal mass. Left ovary Measurements: 3.2 x 1.7 x 2.0 cm. 1.4 x 1.2 x 1.1 cm hemorrhagic corpus luteal cyst. Other findings No free fluid. IMPRESSION: Negative pelvic ultrasound. Electronically Signed   By: Sriyesh  Krishnan M.D.   On: 01/03/2015 14:15   Ct Abdomen Pelvis W Contrast  01/22/2015  CLINICAL DATA:  Lower abdominal pain, nausea and vomiting for 2 days. Reported history of alcoholic hepatitis and colitis. EXAM: CT ABDOMEN AND PELVIS WITH CONTRAST TECHNIQUE: Multidetector CT imaging of the abdomen and pelvis was performed using the standard protocol following bolus administration of intravenous contrast. CONTRAST:  <MEASUREM T> OMNIPAQUE IOHEXOL 300 MG/ML  SOLN COMPARISON:  11/24/2014 CT abdomen/pelvis. FINDINGS: Lower chest: Right middle lobe 3 mm solid pulmonary nodule (series 3/ image 2), stable since 08/07/2012, suggesting benign etiology. Hepatobiliary: Normal liver with no liver mass. Normal gallbladder with no radiopaque cholelithiasis. No biliary ductal dilatation. Pancreas: Normal, with no mass or duct dilation. Spleen: Mild splenomegaly (craniocaudal splenic length 14.0 cm, increased from 13.2 cm).  No splenic mass. Adrenals/Urinary Tract: Normal adrenals. Normal kidneys with no hydronephrosis and no renal mass. Normal bladder. Stomach/Bowel: Grossly normal stomach. Normal caliber small bowel with no small bowel wall thickening. Normal appendix. Normal large bowel with no diverticulosis, large bowel wall thickening or pericolonic fat stranding. Vascular/Lymphatic: Normal caliber abdominal aorta. Patent portal, splenic, hepatic and renal veins. No pathologically enlarged lymph nodes in the abdomen or pelvis. Reproductive: Grossly normal anteverted uterus. There are simple ovarian cysts bilaterally measuring 2.6 cm on the right and 2.9 cm on the left, in keeping with physiologic ovarian cysts. Other: No pneumoperitoneum. No focal intra-abdominal fluid collection. Trace simple free fluid in the pelvic cul-de-sac. Musculoskeletal: No aggressive appearing focal osseous lesions. IMPRESSION: 1. New mild splenomegaly.  No abdominopelvic lymphadenopathy. 2. No evidence of bowel obstruction or acute bowel inflammation. Normal appendix. Electronically Signed   By: Delbert Phenix M.D.   On: 01/22/2015 18:06   Dg Abd 2 Views  01/22/2015  CLINICAL DATA:  Abdominal pain.  History colitis EXAM: ABDOMEN - 2 VIEW COMPARISON:  CT abdomen 11/24/2014 FINDINGS: The bowel gas pattern is normal. There is no evidence of free air. No radio-opaque calculi or other significant radiographic abnormality is seen. IMPRESSION: Negative. Electronically Signed   By: Marlan Palau M.D.   On: 01/22/2015 11:18    Microbiology: Recent Results (from the past 240 hour(s))  C difficile quick scan w PCR reflex     Status: Abnormal   Collection Time: 01/23/15 11:33 PM  Result Value Ref Range Status   C Diff antigen POSITIVE (A) NEGATIVE Final   C Diff toxin NEGATIVE NEGATIVE Final   C Diff interpretation   Final    C. difficile present, but toxin not detected. This indicates colonization. In most cases, this does not require treatment. If  patient has signs and symptoms consistent with colitis, consider treatment. Requires ENTERIC precautions.     Labs: Basic Metabolic Panel:  Recent Labs Lab 01/20/15 0830 01/20/15 1724 01/21/15 0400 01/23/15 0835  NA 139  --  134* 139  K 3.5  --  3.1* 3.3*  CL 104  --  103 105  CO2 21*  --  23 26  GLUCOSE 85  --  86 88  BUN 8  --  <5* <5*  CREATININE 0.76 0.71 0.72 0.71  CALCIUM 9.3  --  7.9* 8.5*  MG  --  1.6*  --  1.8   Liver Function Tests:  Recent Labs Lab 01/20/15 0830 01/21/15 0400  AST 52* 44*  ALT 38 28  ALKPHOS 62 51  BILITOT 0.7 2.0*  PROT 6.8 5.5*  ALBUMIN 4.0 3.1*    Recent Labs Lab 01/20/15 0830  LIPASE 50   No results for input(s): AMMONIA in the last 168 hours. CBC:  Recent Labs Lab 01/20/15 0830 01/20/15 1724 01/21/15 0400  WBC 5.0 4.3 2.9*  HGB 14.3 12.6 12.1  HCT 40.1 36.6 35.3*  MCV 95.2 96.6 96.4  PLT 187 146* 127*   Cardiac Enzymes: No results for input(s): CKTOTAL, CKMB, CKMBINDEX, TROPONINI in the last 168 hours. BNP: BNP (last 3 results) No results for input(s): BNP  in the last 8760 hours.  ProBNP (last 3 results) No results for input(s): PROBNP in the last 8760 hours.  CBG: No results for input(s): GLUCAP in the last 168 hours.     SignedChristiane Ha  [facility] 01/24/2015, 2:09 PM

## 2015-01-29 NOTE — Patient Instructions (Signed)
Your procedure is scheduled on: 02/05/2015  Report to [facility] at   7:00  AM.  Call this number if you have problems the morning of surgery: [phone]   Remember:   Do not drink or eat food:After Midnight.  :  Take these medicines the morning of surgery with A SIP OF WATER: Prozac, Ativan abd Lopressor   Do not wear jewelry, make-up or nail polish.  Do not wear lotions, powders, or perfumes. You may wear deodorant.  Do not shave 48 hours prior to surgery. Men may shave face and neck.  Do not bring valuables to the hospital.  Contacts, dentures or bridgework may not be worn into surgery.  Leave suitcase in the car. After surgery it may be brought to your room.  For patients admitted to the hospital, checkout time is 11:00 AM the day of discharge.   Patients discharged the day of surgery will not be allowed to drive home.    Colonoscopy, Care After Refer to this sheet in the next few weeks. These instructions provide you with information on caring for yourself after your procedure. Your health care provider may also give you more specific instructions. Your treatment has been planned according to current medical practices, but problems sometimes occur. Call your health care provider if you have any problems or questions after your procedure. WHAT TO EXPECT AFTER THE PROCEDURE  After your procedure, it is typical to have the following:  A small amount of blood in your stool.  Moderate amounts of gas and mild abdominal cramping or bloating. HOME CARE INSTRUCTIONS  Do not drive, operate machinery, or sign important documents for 24 hours.  You may shower and resume your regular physical activities, but move at a slower pace for the first 24 hours.  Take frequent rest periods for the first 24 hours.  Walk around or put a warm pack on your abdomen to help reduce abdominal cramping and bloating.  Drink enough fluids to keep your urine clear or pale yellow.  You may resume your  normal diet as instructed by your health care provider. Avoid heavy or fried foods that are hard to digest.  Avoid drinking alcohol for 24 hours or as instructed by your health care provider.  Only take over-the-counter or prescription medicines as directed by your health care provider.  If a tissue sample (biopsy) was taken during your procedure:  Do not take aspirin or blood thinners for 7 days, or as instructed by your health care provider.  Do not drink alcohol for 7 days, or as instructed by your health care provider.  Eat soft foods for the first 24 hours. SEEK MEDICAL CARE IF: You have persistent spotting of blood in your stool 2-3 days after the procedure. SEEK IMMEDIATE MEDICAL CARE IF:  You have more than a small spotting of blood in your stool.  You pass large blood clots in your stool.  Your abdomen is swollen (distended).  You have nausea or vomiting.  You have a fever.  You have increasing abdominal pain that is not relieved with medicine.   This information is not intended to replace advice given to you by your health care provider. Make sure you discuss any questions you have with your health care provider.   Document Released: 11/05/2003 Document Revised: 01/11/2013 Document Reviewed: 11/28/2012 Elsevier Interactive Patient Education [employer]  Anesthesia, Adult, Care After Refer to this sheet in the next few weeks. These instructions provide you with information on caring for yourself after your procedure. Your health care provider may also give you more specific instructions. Your treatment has been planned according to current medical practices, but problems sometimes occur. Call your health care provider if you have any problems or questions after your procedure. WHAT TO EXPECT AFTER THE PROCEDURE After the procedure, it is typical to experience:  Sleepiness.  Nausea and vomiting. HOME CARE INSTRUCTIONS  For the first  24 hours after general anesthesia:  Have a responsible person with you.  Do not drive a car. If you are alone, do not take public transportation.  Do not drink alcohol.  Do not take medicine that has not been prescribed by your health care provider.  Do not sign important papers or make important decisions.  You may resume a normal diet and activities as directed by your health care provider.  Change bandages (dressings) as directed.  If you have questions or problems that seem related to general anesthesia, call the hospital and ask for the anesthetist or anesthesiologist on call. SEEK MEDICAL CARE IF:  You have nausea and vomiting that continue the day after anesthesia.  You develop a rash. SEEK IMMEDIATE MEDICAL CARE IF:   You have difficulty breathing.  You have chest pain.  You have any allergic problems.   This information is not intended to replace advice given to you by your health care provider. Make sure you discuss any questions you have with your health care provider.   Document Released: 06/29/2000 Document Revised: 04/13/2014 Document Reviewed: 07/22/2011 Elsevier Interactive Patient Education [employer] Elsevier Inc.

## 2015-01-30 ENCOUNTER — Telehealth: Payer: Self-pay

## 2015-01-30 ENCOUNTER — Encounter (HOSPITAL_COMMUNITY)
Admission: RE | Admit: 2015-01-30 | Discharge: 2015-01-30 | Disposition: A | Discharge status: Home / Self Care | Payer: Self-pay | Source: Ambulatory Visit | Attending: Gastroenterology | Admitting: Gastroenterology

## 2015-01-30 ENCOUNTER — Other Ambulatory Visit: Payer: Self-pay

## 2015-01-30 ENCOUNTER — Encounter (HOSPITAL_COMMUNITY): Payer: Self-pay

## 2015-01-30 DIAGNOSIS — R197 Diarrhea, unspecified: Secondary | ICD-10-CM | POA: Insufficient documentation

## 2015-01-30 DIAGNOSIS — Z01818 Encounter for other preprocedural examination: Secondary | ICD-10-CM | POA: Insufficient documentation

## 2015-01-30 LAB — HCG, SERUM, QUALITATIVE: Preg, Serum: NEGATIVE

## 2015-01-30 LAB — BASIC METABOLIC PANEL
ANION GAP: 6 (ref 5–15)
BUN: 7 mg/dL (ref 6–20)
CALCIUM: 9 mg/dL (ref 8.9–10.3)
CO2: 27 mmol/L (ref 22–32)
Chloride: 107 mmol/L (ref 101–111)
Creatinine, Ser: 0.78 mg/dL (ref 0.44–1.00)
Glucose, Bld: 88 mg/dL (ref 65–99)
Potassium: 3.8 mmol/L (ref 3.5–5.1)
Sodium: 140 mmol/L (ref 135–145)

## 2015-01-30 LAB — CBC
HEMATOCRIT: 34.6 % — AB (ref 36.0–46.0)
Hemoglobin: 11.9 g/dL — ABNORMAL LOW (ref 12.0–15.0)
MCH: 33.4 pg (ref 26.0–34.0)
MCHC: 34.4 g/dL (ref 30.0–36.0)
MCV: 97.2 fL (ref 78.0–100.0)
Platelets: 187 10*3/uL (ref 150–400)
RBC: 3.56 MIL/uL — AB (ref 3.87–5.11)
RDW: 12.5 % (ref 11.5–15.5)
WBC: 3.6 10*3/uL — AB (ref 4.0–10.5)

## 2015-01-30 NOTE — Telephone Encounter (Signed)
Noted pt states she is not drinking

## 2015-01-30 NOTE — Telephone Encounter (Signed)
Reviewed chart and recent hospitalization notes.  Procedure as planned.  Encourage no etoh as previously discussed at OV and with additional medications.

## 2015-01-30 NOTE — Pre-Procedure Instructions (Signed)
Patient given information to sign up for my chart at home. 

## 2015-01-30 NOTE — Telephone Encounter (Signed)
Pt came by office to pick up another copy of procedure instructions.   Pt wanted to make us aware that she was recently started on Prozac and Trazadone. Wants us to know so we can be aware and if we need to be concerned about any further liver function testing.    Sending to LSL FYI.

## 2015-02-05 ENCOUNTER — Encounter (HOSPITAL_COMMUNITY)
Admission: RE | Disposition: A | Discharge status: Home / Self Care | Payer: Self-pay | Source: Ambulatory Visit | Attending: Gastroenterology

## 2015-02-05 ENCOUNTER — Ambulatory Visit (HOSPITAL_COMMUNITY): Payer: Self-pay | Admitting: Anesthesiology

## 2015-02-05 ENCOUNTER — Encounter (HOSPITAL_COMMUNITY): Payer: Self-pay

## 2015-02-05 ENCOUNTER — Ambulatory Visit (HOSPITAL_COMMUNITY)
Admission: RE | Admit: 2015-02-05 | Discharge: 2015-02-05 | Disposition: A | Discharge status: Home / Self Care | Payer: Self-pay | Source: Ambulatory Visit | Attending: Gastroenterology | Admitting: Gastroenterology

## 2015-02-05 DIAGNOSIS — Z79899 Other long term (current) drug therapy: Secondary | ICD-10-CM | POA: Insufficient documentation

## 2015-02-05 DIAGNOSIS — R197 Diarrhea, unspecified: Secondary | ICD-10-CM | POA: Insufficient documentation

## 2015-02-05 DIAGNOSIS — K648 Other hemorrhoids: Secondary | ICD-10-CM | POA: Insufficient documentation

## 2015-02-05 DIAGNOSIS — E039 Hypothyroidism, unspecified: Secondary | ICD-10-CM | POA: Insufficient documentation

## 2015-02-05 DIAGNOSIS — K5730 Diverticulosis of large intestine without perforation or abscess without bleeding: Secondary | ICD-10-CM | POA: Insufficient documentation

## 2015-02-05 DIAGNOSIS — F419 Anxiety disorder, unspecified: Secondary | ICD-10-CM | POA: Insufficient documentation

## 2015-02-05 DIAGNOSIS — I10 Essential (primary) hypertension: Secondary | ICD-10-CM | POA: Insufficient documentation

## 2015-02-05 HISTORY — PX: COLONOSCOPY WITH PROPOFOL: SHX5780

## 2015-02-05 HISTORY — PX: BIOPSY: SHX5522

## 2015-02-05 SURGERY — COLONOSCOPY WITH PROPOFOL
Anesthesia: Monitor Anesthesia Care | Site: Anus

## 2015-02-05 MED ORDER — GLYCOPYRROLATE 0.2 MG/ML IJ SOLN
0.2000 mg | Freq: Once | INTRAMUSCULAR | Status: AC
  Administered 2015-02-05: 0.2 mg via INTRAVENOUS

## 2015-02-05 MED ORDER — PROPOFOL 10 MG/ML IV BOLUS
INTRAVENOUS | Status: AC
  Filled 2015-02-05: qty 20

## 2015-02-05 MED ORDER — MIDAZOLAM HCL 2 MG/2ML IJ SOLN
INTRAMUSCULAR | Status: AC
  Filled 2015-02-05: qty 4

## 2015-02-05 MED ORDER — FENTANYL CITRATE (PF) 100 MCG/2ML IJ SOLN
INTRAMUSCULAR | Status: DC | PRN
  Administered 2015-02-05 (×4): 25 ug via INTRAVENOUS

## 2015-02-05 MED ORDER — FENTANYL CITRATE (PF) 100 MCG/2ML IJ SOLN: 25.0000 ug | INTRAMUSCULAR | Status: DC | PRN

## 2015-02-05 MED ORDER — WATER FOR IRRIGATION, STERILE IR SOLN
Status: DC | PRN
  Administered 2015-02-05: 1000 mL via SURGICAL_CAVITY

## 2015-02-05 MED ORDER — ONDANSETRON HCL 4 MG/2ML IJ SOLN: 4.0000 mg | Freq: Once | INTRAMUSCULAR | Status: DC | PRN

## 2015-02-05 MED ORDER — EPHEDRINE SULFATE 50 MG/ML IJ SOLN
INTRAMUSCULAR | Status: AC
Start: 2015-02-05 — End: 2015-02-05
  Filled 2015-02-05: qty 1

## 2015-02-05 MED ORDER — SUCCINYLCHOLINE CHLORIDE 20 MG/ML IJ SOLN
INTRAMUSCULAR | Status: AC
  Filled 2015-02-05: qty 1

## 2015-02-05 MED ORDER — MIDAZOLAM HCL 2 MG/2ML IJ SOLN
INTRAMUSCULAR | Status: AC
  Filled 2015-02-05: qty 2

## 2015-02-05 MED ORDER — MIDAZOLAM HCL 5 MG/5ML IJ SOLN
INTRAMUSCULAR | Status: DC | PRN
  Administered 2015-02-05 (×2): 1 mg via INTRAVENOUS

## 2015-02-05 MED ORDER — FENTANYL CITRATE (PF) 100 MCG/2ML IJ SOLN
INTRAMUSCULAR | Status: AC
  Filled 2015-02-05: qty 2

## 2015-02-05 MED ORDER — FENTANYL CITRATE (PF) 100 MCG/2ML IJ SOLN
25.0000 ug | INTRAMUSCULAR | Status: AC
  Administered 2015-02-05 (×2): 25 ug via INTRAVENOUS

## 2015-02-05 MED ORDER — MIDAZOLAM HCL 2 MG/2ML IJ SOLN
1.0000 mg | INTRAMUSCULAR | Status: DC | PRN
  Administered 2015-02-05 (×2): 2 mg via INTRAVENOUS
  Filled 2015-02-05: qty 2

## 2015-02-05 MED ORDER — ONDANSETRON HCL 4 MG/2ML IJ SOLN
4.0000 mg | Freq: Once | INTRAMUSCULAR | Status: AC
  Administered 2015-02-05: 4 mg via INTRAVENOUS

## 2015-02-05 MED ORDER — FENTANYL CITRATE (PF) 100 MCG/2ML IJ SOLN
INTRAMUSCULAR | Status: AC
  Filled 2015-02-05: qty 4

## 2015-02-05 MED ORDER — ONDANSETRON HCL 4 MG/2ML IJ SOLN
INTRAMUSCULAR | Status: AC
  Filled 2015-02-05: qty 2

## 2015-02-05 MED ORDER — PROPOFOL 500 MG/50ML IV EMUL
INTRAVENOUS | Status: DC | PRN
  Administered 2015-02-05: 150 ug/kg/min via INTRAVENOUS

## 2015-02-05 MED ORDER — SIMETHICONE 40 MG/0.6ML PO SUSP
ORAL | Status: AC
  Filled 2015-02-05: qty 0.6

## 2015-02-05 MED ORDER — SODIUM CHLORIDE 0.9 % IJ SOLN
INTRAMUSCULAR | Status: AC
Start: 2015-02-05 — End: 2015-02-05
  Filled 2015-02-05: qty 10

## 2015-02-05 MED ORDER — GLYCOPYRROLATE 0.2 MG/ML IJ SOLN
INTRAMUSCULAR | Status: AC
  Filled 2015-02-05: qty 1

## 2015-02-05 MED ORDER — LACTATED RINGERS IV SOLN
INTRAVENOUS | Status: DC
  Administered 2015-02-05: 08:00:00 via INTRAVENOUS

## 2015-02-05 SURGICAL SUPPLY — 22 items
AMPLIFEYE LARGE BLUE (MISCELLANEOUS) ×2 IMPLANT
ELECT REM PT RETURN 9FT ADLT (ELECTROSURGICAL) ×2
ELECTRODE REM PT RTRN 9FT ADLT (ELECTROSURGICAL) IMPLANT
FCP BXJMBJMB 240X2.8X (CUTTING FORCEPS)
FLOOR PAD 36X40 (MISCELLANEOUS) ×2
FORCEPS BIOP RAD 4 LRG CAP 4 (CUTTING FORCEPS) IMPLANT
FORCEPS BIOP RJ4 240 W/NDL (CUTTING FORCEPS)
FORCEPS BXJMBJMB 240X2.8X (CUTTING FORCEPS) IMPLANT
FORMALIN 10 PREFIL 20ML (MISCELLANEOUS) ×1 IMPLANT
INJECTOR/SNARE I SNARE (MISCELLANEOUS) IMPLANT
KIT ENDO PROCEDURE PEN (KITS) ×2 IMPLANT
MANIFOLD NEPTUNE II (INSTRUMENTS) ×1 IMPLANT
NDL SCLEROTHERAPY 25GX240 (NEEDLE) IMPLANT
NEEDLE SCLEROTHERAPY 25GX240 (NEEDLE) IMPLANT
PAD FLOOR 36X40 (MISCELLANEOUS) ×1 IMPLANT
PROBE APC STR FIRE (PROBE) IMPLANT
PROBE INJECTION GOLD (MISCELLANEOUS)
PROBE INJECTION GOLD 7FR (MISCELLANEOUS) IMPLANT
SNARE SHORT THROW 13M SML OVAL (MISCELLANEOUS) ×2 IMPLANT
TRAP SPECIMEN MUCOUS 40CC (MISCELLANEOUS) IMPLANT
TUBING IRRIGATION ENDOGATOR (MISCELLANEOUS) ×1 IMPLANT
WATER STERILE IRR 1000ML POUR (IV SOLUTION) ×1 IMPLANT

## 2015-02-05 NOTE — Anesthesia Procedure Notes (Signed)
Procedure Name: MAC Date/Time: 02/05/2015 8:30 AM Performed by: Pernell DupreADAMS, Caz Weaver A Pre-anesthesia Checklist: Patient identified, Timeout performed, Emergency Drugs available, Suction available and Patient being monitored Oxygen Delivery Method: Simple face mask

## 2015-02-05 NOTE — Transfer of Care (Signed)
Immediate Anesthesia Transfer of Care Note  Patient: Kara Stewart  Procedure(s) Performed: Procedure(s) with comments: COLONOSCOPY WITH PROPOFOL (N/A) - in  cecum @ 0846, out @  0857 BIOPSY (N/A)  Patient Location: PACU  Anesthesia Type:MAC  Level of Consciousness: awake, alert , oriented and patient cooperative  Airway & Oxygen Therapy: Patient Spontanous Breathing and Patient connected to face mask oxygen  Post-op Assessment: Report given to RN and Post -op Vital signs reviewed and stable  Post vital signs: Reviewed and stable  Last Vitals:  Filed Vitals:   02/05/15 0825  BP: 101/65  Pulse:   Temp:   Resp: 14    Complications: No apparent anesthesia complications

## 2015-02-05 NOTE — H&P (Signed)
Primary Care Physician:  No PCP Per Patient Primary Gastroenterologist:  Dr. Darrick PennaFields  Pre-Procedure History & Physical: HPI:  Kara Stewart is a 39 y.o. female here for  DIARRHEA.  Past Medical History  Diagnosis Date  . SVT (supraventricular tachycardia) (HCC)   . Hiatal hernia   . Anxiety   . Acid reflux   . Dyspepsia 2004    Dx w/ PUD but no EGD, [place]   . Hypertriglyceridemia   . Hypothyroid   . Iritis     frequent  . Hypertension   . Asthma   . Panic attacks   . Thyroid nodule   . RLQ abdominal pain 01/11/2014  . Vaginal discharge 01/11/2014  . Trichomonal vaginitis 01/11/2014  . Weight gain 01/11/2014  . Irregular periods 01/11/2014  . 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 Alcohol abuse 07/21/2012  . Ovarian cyst   . Colitis   . History of colitis 11/29/2014    Past Surgical History  Procedure Laterality Date  . Esophagogastroduodenoscopy  11/06/2011    [name]: MILD Esophagitis/PATENT ESOPHAGEAL Stricture/  Moderate gastritis. Bx no.hpylori or celiac, +gastritis  . Esophagogastroduodenoscopy (egd) with propofol N/A 03/20/2014    [name]: 1. Mild esophagitis & distal esophagela stricture. 2. small hiatal hernia 3. moderate non-erosive gastritis and mild duodenits  . Savory dilation N/A 03/20/2014    Procedure: SAVORY DILATION;  Surgeon: West BaliSandi L Fields, MD;  Location: [facility];  Service: Endoscopy;  Laterality: N/A;  dilated with # 12.8, 14,15,16    Prior to Admission medications   Medication Sig Start Date End Date Taking? Authorizing Provider  B Complex-C (B-COMPLEX WITH VITAMIN C) tablet Take 1 tablet by mouth daily.   Yes Historical Provider, MD  FLUoxetine (PROZAC) 20 MG capsule Take 1 capsule (20 mg total) by mouth daily. 01/24/15  Yes Christiane Haorinna L Sullivan, MD  LORazepam (ATIVAN) 1 MG tablet Take 1 tablet (1 mg total) by mouth every 6 (six) hours as needed (tremulousness, agitation). 01/24/15  Yes Christiane Haorinna L Sullivan, MD  metoprolol tartrate (LOPRESSOR) 25 MG  tablet Take 12.5 mg by mouth as needed (for palpitations).  08/13/12  Yes Renae FickleMackenzie Short, MD  prednisoLONE acetate (PRED FORTE) 1 % ophthalmic suspension Place 1 drop into the left eye every 4 (four) hours.   Yes Historical Provider, MD  sucralfate (CARAFATE) 1 G tablet Take 1 tablet (1 g total) by mouth 4 (four) times daily -  with meals and at bedtime. As needed for abdominal burning. 01/08/15  Yes Tiffany KocherLeslie S Lewis, PA-C  traZODone (DESYREL) 50 MG tablet Take 1 tablet (50 mg total) by mouth at bedtime. 01/24/15  Yes Christiane Haorinna L Sullivan, MD    Allergies as of 01/08/2015 - Review Complete 01/08/2015  Allergen Reaction Noted  . Penicillins Anaphylaxis 10/18/2011  . Lidocaine Other (See Comments) 06/22/2014    Family History  Problem Relation Age of Onset  . Stomach cancer Paternal Grandfather     colon cancer  . Cancer Paternal Grandfather     throat and esophagus  . Breast cancer Maternal Grandmother   . Cancer Maternal Grandmother     skin  . Anxiety disorder Maternal Grandmother   . Hypertension Mother   . Other Mother     fatty liver  . Hyperlipidemia Mother   . Other Father     varicose veins; stomach issues; hernia  . Hypertension Father   . Hyperlipidemia Father   . Cancer Father     prostate  . Arthritis Father  rheumatoid  . Thyroid disease Sister   . Other Brother     hernia  . Diabetes Maternal Grandfather   . Other Paternal Grandmother     hernia  . COPD Paternal Grandmother   . Liver disease Mother     fatty liver, does not drink.     Social History   Social History  . Marital Status: Married    Spouse Name: N/A  . Number of Children: 0  . 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 Smoking status: Never Smoker   . Smokeless tobacco: Never Used  . Alcohol Use: 0.0 oz/week    0 Standard drinks or equivalent per week     Comment: recently tried to quit, lasted for about one month,  recently started back (01/08/15) 1 pint vodka a day 01-20-15  . Drug Use: Yes    Special: Marijuana     Comment: last time was 3 weeks ago.  Marland Kitchen Sexual Activity: Yes    Birth Control/ Protection: None   Other Topics Concern  . Not on file   Social History Narrative   Lives w/ grandfather or parents or family member (moved from [place] 1 month)   Previous MD: Phill Mutter, NP ([place], [place])          Review of Systems: See HPI, otherwise negative ROS   Physical Exam: BP 96/60 mmHg  Pulse 68  Temp(Src) 97.8 F (36.6 C) (Oral)  Resp 15  Ht  (1.626 m)  Wt 159 lb (72.122 kg)  BMI 27.28 kg/m2  SpO2 99%  LMP 02/02/2015 General:   Alert,  pleasant and cooperative in NAD Head:  Normocephalic and atraumatic. Neck:  Supple; Lungs:  Clear throughout to auscultation.    Heart:  Regular rate and rhythm. Abdomen:  Soft, nontender and nondistended. Normal bowel sounds, without guarding, and without rebound.   Neurologic:  Alert and  oriented x4;  grossly normal neurologically.  Impression/Plan:    Diarrhea  PLAN: TCS TODAY WITH BIOPSY

## 2015-02-05 NOTE — Op Note (Signed)
[facility] [address] [place], [id number]   COLONOSCOPY PROCEDURE REPORT  PATIENT: Kara EstersLoye-winchester, Stewart  MR#: #[id number] BIRTHDATE: Jan 07, 1976 , 38  yrs. old GENDER: female ENDOSCOPIST: Kara BaliSandi L Chandi Nicklin, MD REFERRED JK:[id number]:Kara Stewart PROCEDURE DATE:  02/05/2015 PROCEDURE:   Colonoscopy with biopsy INDICATIONS:unexplained diarrhea. MEDICATIONS: Monitored anesthesia care  DESCRIPTION OF PROCEDURE:    Physical exam was performed.  Informed consent was obtained from the patient after explaining the benefits, risks, and alternatives to procedure.  The patient was connected to monitor and placed in left lateral position. Continuous oxygen was provided by nasal cannula and IV medicine administered through an indwelling cannula.  After administration of sedation and rectal exam, the patients rectum was intubated and the     colonoscope was advanced under direct visualization to the ileum.  The scope was removed slowly by carefully examining the color, texture, anatomy, and integrity mucosa on the way out.  The patient was recovered in endoscopy and discharged home in satisfactory condition. Estimated blood loss is zero unless otherwise noted in this procedure report.    COLON FINDINGS: The examined terminal ileum appeared to be normal.  10-15 CM VISUALIZED. , There was mild diverticulosis noted in the transverse colon.  OTHERWISE normal appearing cecum, ileocecal valve, and appendiceal orifice were identified.  The ascending, transverse, descending, sigmoid colon, and rectum appeared unremarkable.  Multiple biopsies were performed using cold forceps. , and Small internal hemorrhoids were found.  PREP QUALITY: excellent.  CECAL W/D TIME: 11       minutes COMPLICATIONS: None  ENDOSCOPIC IMPRESSION: 1.   NO OBVIOUS SOURCE FOR DIARRHEA IDENTIFIED 2.   Mild diverticulosis in the transverse colon 3.   Small internal hemorrhoids  RECOMMENDATIONS: FOLLOW A  HIGH FIBER DIET. USE IMODIUM one or two tablets OR PEPTO BISMOL 3 TABLETS 30 MINS BEFORE MEALS bid. AWAIT BIOPSY RESULTS. AVOID ETOH.  WILL CONSIDER VIBERZI IF PT CONSTINUES TO AVOID ETOH. Next colonoscopy in 10 years.   eSigned:  West BaliSandi L Keishawna Carranza, MD 02/05/2015 9:29 AM   CPT CODES: ICD CODES:  The ICD and CPT codes recommended by this software are interpretations from the data that the clinical staff has captured with the software.  The verification of the translation of this report to the ICD and CPT codes and modifiers is the sole responsibility of the health care institution and practicing physician where this report was generated.  [employer] will not be held responsible for the validity of the ICD and CPT codes included on this report.  AMA assumes no liability for data contained or not contained herein. CPT is a Publishing rights managerregistered trademark of the [employer].

## 2015-02-05 NOTE — Discharge Instructions (Signed)
NO OBVIOUS SOURCE FOR YOUR DIARRHEA WAS IDENTIFIED. You have internal hemorrhoids and diverticulosis in your left colon. The last part of your small intestines is normal. I biopsied your colon. You did not have any polyps.   Congratulation on your efforts to avoid alcohol.   FOLLOW A HIGH FIBER DIET. AVOID ITEMS THAT CAUSE BLOATING. SEE INFO BELOW.  USE IMODIUM one or two tablets OR PEPTO BISMOL 3 TABLETS 30 MINS BEFORE MEALS TWICE DAILY TO CONTROL DIARRHEA.  YOUR BIOPSY RESULTS WILL BE AVAILABLE IN MY CHART AFTER NOV 3 AND MY OFFICE WILL CONTACT YOU IN 10-14 DAYS WITH YOUR RESULTS.   Next colonoscopy in 10 years.  Colonoscopy Care After Read the instructions outlined below and refer to this sheet in the next week. These discharge instructions provide you with general information on caring for yourself after you leave the hospital. While your treatment has been planned according to the most current medical practices available, unavoidable complications occasionally occur. If you have any problems or questions after discharge, call DR. Kalman Nylen, [phone].  ACTIVITY  You may resume your regular activity, but move at a slower pace for the next 24 hours.   Take frequent rest periods for the next 24 hours.   Walking will help get rid of the air and reduce the bloated feeling in your belly (abdomen).   No driving for 24 hours (because of the medicine (anesthesia) used during the test).   You may shower.   Do not sign any important legal documents or operate any machinery for 24 hours (because of the anesthesia used during the test).    NUTRITION  Drink plenty of fluids.   You may resume your normal diet as instructed by your doctor.   Begin with a light meal and progress to your normal diet. Heavy or fried foods are harder to digest and may make you feel sick to your stomach (nauseated).   Avoid alcoholic beverages for 24 hours or as instructed.    MEDICATIONS  You may resume  your normal medications.   WHAT YOU CAN EXPECT TODAY  Some feelings of bloating in the abdomen.   Passage of more gas than usual.   Spotting of blood in your stool or on the toilet paper  .  IF YOU HAD POLYPS REMOVED DURING THE COLONOSCOPY:  Eat a soft diet IF YOU HAVE NAUSEA, BLOATING, ABDOMINAL PAIN, OR VOMITING.    FINDING OUT THE RESULTS OF YOUR TEST Not all test results are available during your visit. DR. Darrick Penna WILL CALL YOU WITHIN 14 DAYS OF YOUR PROCEDUE WITH YOUR RESULTS. Do not assume everything is normal if you have not heard from DR. Shermaine Brigham, CALL HER OFFICE AT [phone].  SEEK IMMEDIATE MEDICAL ATTENTION AND CALL THE OFFICE: [phone] IF:  You have more than a spotting of blood in your stool.   Your belly is swollen (abdominal distention).   You are nauseated or vomiting.   You have a temperature over 101F.   You have abdominal pain or discomfort that is severe or gets worse throughout the day.  High-Fiber Diet A high-fiber diet changes your normal diet to include more whole grains, legumes, fruits, and vegetables. Changes in the diet involve replacing refined carbohydrates with unrefined foods. The calorie level of the diet is essentially unchanged. The Dietary Reference Intake (recommended amount) for adult males is 38 grams per day. For adult females, it is 25 grams per day. Pregnant and lactating women should consume 28 grams of fiber per  day. Fiber is the intact part of a plant that is not broken down during digestion. Functional fiber is fiber that has been isolated from the plant to provide a beneficial effect in the body. PURPOSE  Increase stool bulk.   Ease and regulate bowel movements.   Lower cholesterol.  REDUCE RISK OF COLON CANCER  INDICATIONS THAT YOU NEED MORE FIBER  Constipation and hemorrhoids.   Uncomplicated diverticulosis (intestine condition) and irritable bowel syndrome.   Weight management.   As a protective measure  against hardening of the arteries (atherosclerosis), diabetes, and cancer.   GUIDELINES FOR INCREASING FIBER IN THE DIET  Start adding fiber to the diet slowly. A gradual increase of about 5 more grams (2 slices of whole-wheat bread, 2 servings of most fruits or vegetables, or 1 bowl of high-fiber cereal) per day is best. Too rapid an increase in fiber may result in constipation, flatulence, and bloating.   Drink enough water and fluids to keep your urine clear or pale yellow. Water, juice, or caffeine-free drinks are recommended. Not drinking enough fluid may cause constipation.   Eat a variety of high-fiber foods rather than one type of fiber.   Try to increase your intake of fiber through using high-fiber foods rather than fiber pills or supplements that contain small amounts of fiber.   The goal is to change the types of food eaten. Do not supplement your present diet with high-fiber foods, but replace foods in your present diet.   INCLUDE A VARIETY OF FIBER SOURCES  Replace refined and processed grains with whole grains, canned fruits with fresh fruits, and incorporate other fiber sources. White rice, white breads, and most bakery goods contain little or no fiber.   Brown whole-grain rice, buckwheat oats, and many fruits and vegetables are all good sources of fiber. These include: broccoli, Brussels sprouts, cabbage, cauliflower, beets, sweet potatoes, white potatoes (skin on), carrots, tomatoes, eggplant, squash, berries, fresh fruits, and dried fruits.   Cereals appear to be the richest source of fiber. Cereal fiber is found in whole grains and bran. Bran is the fiber-rich outer coat of cereal grain, which is largely removed in refining. In whole-grain cereals, the bran remains. In breakfast cereals, the largest amount of fiber is found in those with "bran" in their names. The fiber content is sometimes indicated on the label.   You may need to include additional fruits and vegetables  each day.   In baking, for 1 cup white flour, you may use the following substitutions:   1 cup whole-wheat flour minus 2 tablespoons.   1/2 cup white flour plus 1/2 cup whole-wheat flour.   Diverticulosis Diverticulosis is a common condition that develops when small pouches (diverticula) form in the wall of the colon. The risk of diverticulosis increases with age. It happens more often in people who eat a low-fiber diet. Most individuals with diverticulosis have no symptoms. Those individuals with symptoms usually experience belly (abdominal) pain, constipation, or loose stools (diarrhea).  HOME CARE INSTRUCTIONS  Increase the amount of fiber in your diet as directed by your caregiver or dietician. This may reduce symptoms of diverticulosis.   Drink at least 6 to 8 glasses of water each day to prevent constipation.   Try not to strain when you have a bowel movement.   Avoiding nuts and seeds to prevent complications is NOT NECESSARY.   FOODS HAVING HIGH FIBER CONTENT INCLUDE:  Fruits. Apple, peach, pear, tangerine, raisins, prunes.   Vegetables. Brussels sprouts, asparagus,  broccoli, cabbage, carrot, cauliflower, romaine lettuce, spinach, summer squash, tomato, winter squash, zucchini.   Starchy Vegetables. Baked beans, kidney beans, lima beans, split peas, lentils, potatoes (with skin).   Grains. Whole wheat bread, brown rice, bran flake cereal, plain oatmeal, white rice, shredded wheat, bran muffins.   SEEK IMMEDIATE MEDICAL CARE IF:  You develop increasing pain or severe bloating.   You have an oral temperature above 101F.   You develop vomiting or bowel movements that are bloody or black.   Hemorrhoids Hemorrhoids are dilated (enlarged) veins around the rectum. Sometimes clots will form in the veins. This makes them swollen and painful. These are called thrombosed hemorrhoids. Causes of hemorrhoids include:  Constipation.   Straining to have a bowel movement.    HEAVY LIFTING  HOME CARE INSTRUCTIONS  Eat a well balanced diet and drink 6 to 8 glasses of water every day to avoid constipation. You may also use a bulk laxative.   Avoid straining to have bowel movements.   Keep anal area dry and clean.   Do not use a donut shaped pillow or sit on the toilet for long periods. This increases blood pooling and pain.   Move your bowels when your body has the urge; this will require less straining and will decrease pain and pressure.

## 2015-02-05 NOTE — Anesthesia Preprocedure Evaluation (Signed)
Anesthesia Evaluation  Patient identified by MRN, date of birth, ID band Patient awake    Reviewed: Allergy & Precautions, H&P , NPO status , Patient's Chart, lab work & pertinent test results  Airway Mallampati: II  TM Distance: >3 FB     Dental  (+) Teeth Intact   Pulmonary asthma ,    breath sounds clear to auscultation       Cardiovascular hypertension, Pt. on medications  Rhythm:Regular Rate:Normal     Neuro/Psych PSYCHIATRIC DISORDERS Anxiety    GI/Hepatic hiatal hernia, GERD  ,(+)     substance abuse  alcohol use, Hepatitis -  Endo/Other  Hypothyroidism   Renal/GU      Musculoskeletal   Abdominal   Peds  Hematology   Anesthesia Other Findings   Reproductive/Obstetrics                             Anesthesia Physical Anesthesia Plan  ASA: III  Anesthesia Plan: MAC   Post-op Pain Management:    Induction: Intravenous  Airway Management Planned: Simple Face Mask  Additional Equipment:   Intra-op Plan:   Post-operative Plan:   Informed Consent: I have reviewed the patients History and Physical, chart, labs and discussed the procedure including the risks, benefits and alternatives for the proposed anesthesia with the patient or authorized representative who has indicated his/her understanding and acceptance.     Plan Discussed with:   Anesthesia Plan Comments:         Anesthesia Quick Evaluation  

## 2015-02-05 NOTE — Anesthesia Postprocedure Evaluation (Signed)
  Anesthesia Post-op Note  Patient: [name] Arredondo-Winchester  Procedure(s) Performed: Procedure(s) with comments: COLONOSCOPY WITH PROPOFOL (N/A) - in  cecum @ 0846, out @  0857 BIOPSY (N/A)  Patient Location: PACU  Anesthesia Type:MAC  Level of Consciousness: awake, alert , oriented and patient cooperative  Airway and Oxygen Therapy: Patient Spontanous Breathing and Patient connected to face mask oxygen  Post-op Pain: none  Post-op Assessment: Post-op Vital signs reviewed, Patient's Cardiovascular Status Stable, Respiratory Function Stable, Patent Airway, No signs of Nausea or vomiting and Pain level controlled              Post-op Vital Signs: Reviewed and stable  Last Vitals:  Filed Vitals:   02/05/15 0825  BP: 101/65  Pulse:   Temp:   Resp: 14    Complications: No apparent anesthesia complications

## 2015-02-06 ENCOUNTER — Encounter (HOSPITAL_COMMUNITY): Payer: Self-pay | Admitting: Gastroenterology

## 2015-02-12 ENCOUNTER — Other Ambulatory Visit: Payer: Self-pay | Admitting: Adult Health

## 2015-02-12 ENCOUNTER — Telehealth: Payer: Self-pay | Admitting: Gastroenterology

## 2015-02-12 NOTE — Telephone Encounter (Signed)
PT CALLED INQUIRING ABOUT HER TCS RESULTS

## 2015-02-12 NOTE — Telephone Encounter (Signed)
Informed pt that we have 10-14 business days to call with results. Routing to Dr. Darrick PennaFields.

## 2015-02-14 NOTE — Telephone Encounter (Signed)
Please call pt. Her colon biopsies are normal.    FOLLOW A HIGH FIBER DIET. AVOID ITEMS THAT CAUSE BLOATING.   USE IMODIUM one or two tablets OR PEPTO BISMOL 3 TABLETS 30 MINS BEFORE MEALS TWICE DAILY TO CONTROL DIARRHEA.  CALL IN ONE MONTH IF SYMPTOMS ARE NOT IMPROVED.   FOLLOW UP IN 4 MOS- E30 ABDOMINAL PAIN/DIARRHEA.  Next colonoscopy in 10 years.

## 2015-02-15 ENCOUNTER — Encounter: Payer: Self-pay | Admitting: Gastroenterology

## 2015-02-15 NOTE — Telephone Encounter (Signed)
Pt is aware of results. 

## 2015-02-15 NOTE — Telephone Encounter (Signed)
LMOM to call.

## 2015-02-15 NOTE — Telephone Encounter (Signed)
APPT MADE AND ON RECALL  °

## 2015-02-23 ENCOUNTER — Other Ambulatory Visit: Payer: Self-pay | Admitting: Adult Health

## 2015-02-25 ENCOUNTER — Other Ambulatory Visit: Payer: Self-pay | Admitting: Gastroenterology

## 2015-04-24 ENCOUNTER — Other Ambulatory Visit: Payer: Self-pay | Admitting: Adult Health

## 2015-04-25 ENCOUNTER — Telehealth: Payer: Self-pay

## 2015-04-25 MED ORDER — FLUOXETINE HCL 20 MG PO CAPS: 20.0000 mg | ORAL_CAPSULE | Freq: Every day | ORAL | Status: DC

## 2015-04-25 NOTE — Telephone Encounter (Signed)
RX for Prozac #7 sent. No further refills.

## 2015-04-25 NOTE — Telephone Encounter (Signed)
LMOM to call back

## 2015-04-25 NOTE — Telephone Encounter (Signed)
Pt is calling to see if we can call in her Prozac  daily until she she can see a family doctor next week( 05/01/15). She goes to [facility]. Please advise

## 2015-04-26 ENCOUNTER — Ambulatory Visit (INDEPENDENT_AMBULATORY_CARE_PROVIDER_SITE_OTHER): Payer: Self-pay | Admitting: Adult Health

## 2015-04-26 ENCOUNTER — Encounter: Payer: Self-pay | Admitting: Adult Health

## 2015-04-26 VITALS — BP 128/80 | HR 78 | Ht 63.5 in | Wt 163.0 lb

## 2015-04-26 DIAGNOSIS — A499 Bacterial infection, unspecified: Secondary | ICD-10-CM

## 2015-04-26 DIAGNOSIS — N898 Other specified noninflammatory disorders of vagina: Secondary | ICD-10-CM

## 2015-04-26 DIAGNOSIS — N760 Acute vaginitis: Secondary | ICD-10-CM

## 2015-04-26 DIAGNOSIS — F1011 Alcohol abuse, in remission: Secondary | ICD-10-CM

## 2015-04-26 DIAGNOSIS — F1010 Alcohol abuse, uncomplicated: Secondary | ICD-10-CM

## 2015-04-26 DIAGNOSIS — B9689 Other specified bacterial agents as the cause of diseases classified elsewhere: Secondary | ICD-10-CM | POA: Insufficient documentation

## 2015-04-26 HISTORY — DX: Alcohol abuse, in remission: F10.11

## 2015-04-26 LAB — POCT WET PREP (WET MOUNT): Clue Cells Wet Prep Whiff POC: POSITIVE

## 2015-04-26 MED ORDER — TRAZODONE HCL 50 MG PO TABS: 50.0000 mg | ORAL_TABLET | Freq: Every day | ORAL | Status: DC

## 2015-04-26 MED ORDER — METRONIDAZOLE 500 MG PO TABS: 500.0000 mg | ORAL_TABLET | Freq: Two times a day (BID) | ORAL | Status: DC

## 2015-04-26 MED ORDER — FLUOXETINE HCL 20 MG PO CAPS: 20.0000 mg | ORAL_CAPSULE | Freq: Every day | ORAL | Status: DC

## 2015-04-26 NOTE — Patient Instructions (Signed)
Pap and physical in 6 weeks  Take flagyl  NO ALCOHOL Bacterial Vaginosis Bacterial vaginosis is a vaginal infection that occurs when the normal balance of bacteria in the vagina is disrupted. It results from an overgrowth of certain bacteria. This is the most common vaginal infection in women of childbearing age. Treatment is important to prevent complications, especially in pregnant women, as it can cause a premature delivery. CAUSES  Bacterial vaginosis is caused by an increase in harmful bacteria that are normally present in smaller amounts in the vagina. Several different kinds of bacteria can cause bacterial vaginosis. However, the reason that the condition develops is not fully understood. RISK FACTORS Certain activities or behaviors can put you at an increased risk of developing bacterial vaginosis, including:  Having a new sex partner or multiple sex partners.  Douching.  Using an intrauterine device (IUD) for contraception. Women do not get bacterial vaginosis from toilet seats, bedding, swimming pools, or contact with objects around them. SIGNS AND SYMPTOMS  Some women with bacterial vaginosis have no signs or symptoms. Common symptoms include:  Grey vaginal discharge.  A fishlike odor with discharge, especially after sexual intercourse.  Itching or burning of the vagina and vulva.  Burning or pain with urination. DIAGNOSIS  Your health care provider will take a medical history and examine the vagina for signs of bacterial vaginosis. A sample of vaginal fluid may be taken. Your health care provider will look at this sample under a microscope to check for bacteria and abnormal cells. A vaginal pH test may also be done.  TREATMENT  Bacterial vaginosis may be treated with antibiotic medicines. These may be given in the form of a pill or a vaginal cream. A second round of antibiotics may be prescribed if the condition comes back after treatment. Because bacterial vaginosis increases  your risk for sexually transmitted diseases, getting treated can help reduce your risk for chlamydia, gonorrhea, HIV, and herpes. HOME CARE INSTRUCTIONS   Only take over-the-counter or prescription medicines as directed by your health care provider.  If antibiotic medicine was prescribed, take it as directed. Make sure you finish it even if you start to feel better.  Tell all sexual partners that you have a vaginal infection. They should see their health care provider and be treated if they have problems, such as a mild rash or itching.  During treatment, it is important that you follow these instructions:  Avoid sexual activity or use condoms correctly.  Do not douche.  Avoid alcohol as directed by your health care provider.  Avoid breastfeeding as directed by your health care provider. SEEK MEDICAL CARE IF:   Your symptoms are not improving after 3 days of treatment.  You have increased discharge or pain.  You have a fever. MAKE SURE YOU:   Understand these instructions.  Will watch your condition.  Will get help right away if you are not doing well or get worse. FOR MORE INFORMATION  Centers for Disease Control and Prevention, Division of STD Prevention: SolutionApps.[employer].za American Sexual Health Association (ASHA): www.ashastd.org    This information is not intended to replace advice given to you by your health care provider. Make sure you discuss any questions you have with your health care provider.   Document Released: 03/23/2005 Document Revised: 04/13/2014 Document Reviewed: 11/02/2012 Elsevier Interactive Patient Education [employer]

## 2015-04-26 NOTE — Progress Notes (Signed)
Subjective:     Patient ID: Kara Stewart, female   DOB: 17-Dec-1975, 40 y.o.   MRN: [id number]  HPI Kara Stewart is a 40 year old white female, married, in complaining of vaginal discharge with odor, and needs refills on prozac and trazodone.She self committed her self on October to [facility] for alcohol abuse, and was discharged 01/24/15 on prozac, ativan and trazodone and has been out of prozac and trazodone for 2 weeks.She is self pay with [facility] discount and has been unable to make appt at [facility].She is working now and will have insurance in about 4 weeks.She says she has not had a drink since being home and all her GI complaints of the past have resolved.  Review of Systems Patient denies any headaches, hearing loss, fatigue, blurred vision, shortness of breath, chest pain, abdominal pain, problems with bowel movements, urination, or intercourse. No joint pain or mood swings.See HPI for positives. Reviewed past medical,surgical, social and family history. Reviewed medications and allergies.     Objective:   Physical Exam BP 128/80 mmHg  Pulse 78  Ht 5' 3.5" (1.613 m)  Wt 163 lb (73.936 kg)  BMI 28.42 kg/m2  LMP 04/17/2015 Skin warm and dry.Pelvic: external genitalia is normal in appearance no lesions, vagina: white discharge with odor,urethra has no lesions or masses noted, cervix:smooth, uterus: normal size, shape and contour, non tender, no masses felt, adnexa: no masses or tenderness noted. Bladder is non tender and no masses felt. Wet prep: + for clue cells and +WBCs. Discussed with Dr Despina Hidden and he is fine with me refilling Prozac and Trazodone for her.I told her this was short term option and she really needs to get in at [facility] and she agrees.    Assessment:     Vaginal discharge BV History of alcohol abuse    Plan:     Rx flagyl 500 mg 1 bid x 7 days, no alcohol, review handout on BV   Refilled prozac 20 mg #30 take 1 daily with 3 refills Refilled trazodone 50 mg #30  take 1 at hs with 1 refill Return in 6 weeks for Pap and physical Keep trying to get appt with [facility]

## 2015-06-10 ENCOUNTER — Other Ambulatory Visit: Payer: Self-pay | Admitting: Adult Health

## 2015-06-17 ENCOUNTER — Ambulatory Visit: Payer: Self-pay | Admitting: Gastroenterology

## 2015-06-17 ENCOUNTER — Encounter: Payer: Self-pay | Admitting: Gastroenterology

## 2015-06-27 ENCOUNTER — Emergency Department (HOSPITAL_COMMUNITY): Payer: Self-pay

## 2015-06-27 ENCOUNTER — Encounter (HOSPITAL_COMMUNITY): Payer: Self-pay

## 2015-06-27 ENCOUNTER — Emergency Department (HOSPITAL_COMMUNITY)
Admission: EM | Admit: 2015-06-27 | Discharge: 2015-06-27 | Disposition: A | Discharge status: Home / Self Care | Payer: Self-pay | Attending: Emergency Medicine | Admitting: Emergency Medicine

## 2015-06-27 DIAGNOSIS — R112 Nausea with vomiting, unspecified: Secondary | ICD-10-CM

## 2015-06-27 DIAGNOSIS — E039 Hypothyroidism, unspecified: Secondary | ICD-10-CM | POA: Insufficient documentation

## 2015-06-27 DIAGNOSIS — I10 Essential (primary) hypertension: Secondary | ICD-10-CM | POA: Insufficient documentation

## 2015-06-27 DIAGNOSIS — J45909 Unspecified asthma, uncomplicated: Secondary | ICD-10-CM | POA: Insufficient documentation

## 2015-06-27 DIAGNOSIS — Z79899 Other long term (current) drug therapy: Secondary | ICD-10-CM | POA: Insufficient documentation

## 2015-06-27 DIAGNOSIS — F10230 Alcohol dependence with withdrawal, uncomplicated: Secondary | ICD-10-CM | POA: Insufficient documentation

## 2015-06-27 DIAGNOSIS — N390 Urinary tract infection, site not specified: Secondary | ICD-10-CM | POA: Insufficient documentation

## 2015-06-27 DIAGNOSIS — F10930 Alcohol use, unspecified with withdrawal, uncomplicated: Secondary | ICD-10-CM

## 2015-06-27 DIAGNOSIS — N760 Acute vaginitis: Secondary | ICD-10-CM | POA: Insufficient documentation

## 2015-06-27 DIAGNOSIS — B9689 Other specified bacterial agents as the cause of diseases classified elsewhere: Secondary | ICD-10-CM

## 2015-06-27 DIAGNOSIS — R1031 Right lower quadrant pain: Secondary | ICD-10-CM | POA: Insufficient documentation

## 2015-06-27 LAB — COMPREHENSIVE METABOLIC PANEL
ALT: 12 U/L — ABNORMAL LOW (ref 14–54)
ANION GAP: 8 (ref 5–15)
AST: 20 U/L (ref 15–41)
Albumin: 3.6 g/dL (ref 3.5–5.0)
Alkaline Phosphatase: 63 U/L (ref 38–126)
BUN: 10 mg/dL (ref 6–20)
CHLORIDE: 107 mmol/L (ref 101–111)
CO2: 24 mmol/L (ref 22–32)
Calcium: 8.5 mg/dL — ABNORMAL LOW (ref 8.9–10.3)
Creatinine, Ser: 0.82 mg/dL (ref 0.44–1.00)
Glucose, Bld: 90 mg/dL (ref 65–99)
POTASSIUM: 3.8 mmol/L (ref 3.5–5.1)
Sodium: 139 mmol/L (ref 135–145)
Total Bilirubin: 1.3 mg/dL — ABNORMAL HIGH (ref 0.3–1.2)
Total Protein: 6.4 g/dL — ABNORMAL LOW (ref 6.5–8.1)

## 2015-06-27 LAB — WET PREP, GENITAL
Sperm: NONE SEEN
TRICH WET PREP: NONE SEEN
Yeast Wet Prep HPF POC: NONE SEEN

## 2015-06-27 LAB — POC URINE PREG, ED: Preg Test, Ur: NEGATIVE

## 2015-06-27 LAB — RAPID URINE DRUG SCREEN, HOSP PERFORMED
Amphetamines: NOT DETECTED
Barbiturates: NOT DETECTED
Benzodiazepines: NOT DETECTED
COCAINE: NOT DETECTED
OPIATES: NOT DETECTED
TETRAHYDROCANNABINOL: NOT DETECTED

## 2015-06-27 LAB — URINALYSIS, ROUTINE W REFLEX MICROSCOPIC
GLUCOSE, UA: NEGATIVE mg/dL
Ketones, ur: 15 mg/dL — AB
Nitrite: NEGATIVE
PH: 7.5 (ref 5.0–8.0)
PROTEIN: NEGATIVE mg/dL
Specific Gravity, Urine: 1.01 (ref 1.005–1.030)

## 2015-06-27 LAB — CBC
HEMATOCRIT: 37.2 % (ref 36.0–46.0)
Hemoglobin: 13.2 g/dL (ref 12.0–15.0)
MCH: 33.3 pg (ref 26.0–34.0)
MCHC: 35.5 g/dL (ref 30.0–36.0)
MCV: 93.9 fL (ref 78.0–100.0)
Platelets: 142 10*3/uL — ABNORMAL LOW (ref 150–400)
RBC: 3.96 MIL/uL (ref 3.87–5.11)
RDW: 18.5 % — ABNORMAL HIGH (ref 11.5–15.5)
WBC: 5 10*3/uL (ref 4.0–10.5)

## 2015-06-27 LAB — URINE MICROSCOPIC-ADD ON

## 2015-06-27 LAB — ETHANOL: ALCOHOL ETHYL (B): 7 mg/dL — AB (ref <5)

## 2015-06-27 LAB — LIPASE, BLOOD: LIPASE: 38 U/L (ref 11–51)

## 2015-06-27 MED ORDER — LORAZEPAM 2 MG/ML IJ SOLN
1.0000 mg | Freq: Once | INTRAMUSCULAR | Status: AC
  Administered 2015-06-27: 1 mg via INTRAVENOUS
  Filled 2015-06-27: qty 1

## 2015-06-27 MED ORDER — SODIUM CHLORIDE 0.9 % IV BOLUS (SEPSIS)
1000.0000 mL | Freq: Once | INTRAVENOUS | Status: AC
  Administered 2015-06-27: 1000 mL via INTRAVENOUS

## 2015-06-27 MED ORDER — CIPROFLOXACIN HCL 500 MG PO TABS: 500.0000 mg | ORAL_TABLET | Freq: Two times a day (BID) | ORAL | Status: DC

## 2015-06-27 MED ORDER — METRONIDAZOLE 500 MG PO TABS: 500.0000 mg | ORAL_TABLET | Freq: Two times a day (BID) | ORAL | Status: DC

## 2015-06-27 MED ORDER — CIPROFLOXACIN IN D5W 400 MG/200ML IV SOLN
400.0000 mg | Freq: Once | INTRAVENOUS | Status: AC
  Administered 2015-06-27: 400 mg via INTRAVENOUS
  Filled 2015-06-27: qty 200

## 2015-06-27 MED ORDER — MORPHINE SULFATE (PF) 4 MG/ML IV SOLN
4.0000 mg | Freq: Once | INTRAVENOUS | Status: AC
  Administered 2015-06-27: 4 mg via INTRAVENOUS
  Filled 2015-06-27: qty 1

## 2015-06-27 MED ORDER — SODIUM CHLORIDE 0.9 % IV SOLN
INTRAVENOUS | Status: DC
  Administered 2015-06-27: 06:00:00 via INTRAVENOUS

## 2015-06-27 MED ORDER — IOHEXOL 300 MG/ML  SOLN
100.0000 mL | Freq: Once | INTRAMUSCULAR | Status: AC | PRN
  Administered 2015-06-27: 100 mL via INTRAVENOUS

## 2015-06-27 MED ORDER — LIDOCAINE HCL (PF) 1 % IJ SOLN
INTRAMUSCULAR | Status: AC
  Administered 2015-06-27: 0.9 mL
  Filled 2015-06-27: qty 5

## 2015-06-27 MED ORDER — FOLIC ACID 1 MG PO TABS: 1.0000 mg | ORAL_TABLET | Freq: Every day | ORAL | Status: DC

## 2015-06-27 MED ORDER — CEFTRIAXONE SODIUM 250 MG IJ SOLR
250.0000 mg | Freq: Once | INTRAMUSCULAR | Status: AC
  Administered 2015-06-27: 250 mg via INTRAMUSCULAR
  Filled 2015-06-27: qty 250

## 2015-06-27 MED ORDER — METRONIDAZOLE 500 MG PO TABS
500.0000 mg | ORAL_TABLET | Freq: Once | ORAL | Status: AC
  Administered 2015-06-27: 500 mg via ORAL
  Filled 2015-06-27: qty 1

## 2015-06-27 MED ORDER — VITAMIN B-1 100 MG PO TABS: 100.0000 mg | ORAL_TABLET | Freq: Every day | ORAL | Status: DC

## 2015-06-27 MED ORDER — ONDANSETRON 4 MG PO TBDP: 4.0000 mg | ORAL_TABLET | Freq: Three times a day (TID) | ORAL | Status: DC | PRN

## 2015-06-27 MED ORDER — CHLORDIAZEPOXIDE HCL 25 MG PO CAPS: ORAL_CAPSULE | ORAL | Status: DC

## 2015-06-27 MED ORDER — THIAMINE HCL 100 MG/ML IJ SOLN
100.0000 mg | Freq: Once | INTRAMUSCULAR | Status: AC
  Administered 2015-06-27: 100 mg via INTRAVENOUS
  Filled 2015-06-27: qty 2

## 2015-06-27 MED ORDER — AZITHROMYCIN 250 MG PO TABS
1000.0000 mg | ORAL_TABLET | Freq: Once | ORAL | Status: AC
  Administered 2015-06-27: 1000 mg via ORAL
  Filled 2015-06-27: qty 4

## 2015-06-27 MED ORDER — ONDANSETRON HCL 4 MG/2ML IJ SOLN
4.0000 mg | Freq: Once | INTRAMUSCULAR | Status: AC
  Administered 2015-06-27: 4 mg via INTRAVENOUS
  Filled 2015-06-27: qty 2

## 2015-06-27 NOTE — ED Notes (Signed)
Pt c/o IV "feeling tight"; IV site assessed and blood drew back from IV without difficulty; pt gone to CT

## 2015-06-27 NOTE — ED Notes (Signed)
Pt up to bathroom and back to bed with no assistance ° °

## 2015-06-27 NOTE — ED Notes (Signed)
Patient ambulated to the bathroom.

## 2015-06-27 NOTE — ED Provider Notes (Signed)
TIME SEEN: 2:20 AM  CHIEF COMPLAINT: Multiple complaints  HPI: Pt is a 40 y.o. female with history of SVT, alcohol abuse who presents to the emergency department with multiple complaints. States that over the past several days she has had nausea and vomiting. Also noticed that she has had vaginal discharge and urine with a foul odor. She has had some mild dysuria. Today she started having right-sided abdominal pain that she describes as severe insert. No diarrhea. No fevers or chills. No history of abdominal surgery. No known sick contacts. States that she recently found out that her husband was cheating on her. Also reports that she feels her heart is racing. No chest pain or shortness of breath. States that she has a history of alcohol abuse and was sober for 6 months but started drinking again 2 weeks ago. Quit 2 days ago and thinks she is now withdrawing. No history of alcohol withdrawal seizure.  ROS: See HPI Constitutional: no fever  Eyes: no drainage  ENT: no runny nose   Cardiovascular:  no chest pain  Resp: no SOB  GI:  vomiting GU: dysuria Integumentary: no rash  Allergy: no hives  Musculoskeletal: no leg swelling  Neurological: no slurred speech ROS otherwise negative  PAST MEDICAL HISTORY/PAST SURGICAL HISTORY:  Past Medical History  Diagnosis Date  . SVT (supraventricular tachycardia) (HCC)   . Hiatal hernia   . Anxiety   . Acid reflux   . Dyspepsia 2004    Dx w/ PUD but no EGD, [place]   . Hypertriglyceridemia   . Hypothyroid   . Iritis     frequent  . Hypertension   . Asthma   . Panic attacks   . Thyroid nodule   . RLQ abdominal pain 01/11/2014  . Vaginal discharge 01/11/2014  . Trichomonal vaginitis 01/11/2014  . Weight gain 01/11/2014  . Irregular periods 01/11/2014  . 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 Alcohol abuse 07/21/2012  . Ovarian cyst   . Colitis   . History of colitis 11/29/2014  . BV (bacterial vaginosis) 04/26/2015  . History of alcohol abuse  04/26/2015    MEDICATIONS:  Prior to Admission medications   Medication Sig Start Date End Date Taking? Authorizing Provider  FLUoxetine (PROZAC) 20 MG capsule Take 1 capsule (20 mg total) by mouth daily. 04/26/15  Yes Adline PotterJennifer A Griffin, NP  LORazepam (ATIVAN) 1 MG tablet Take 1 tablet (1 mg total) by mouth every 6 (six) hours as needed (tremulousness, agitation). 01/24/15  Yes Christiane Haorinna L Sullivan, MD  metoprolol tartrate (LOPRESSOR) 25 MG tablet Take 12.5 mg by mouth as needed (for palpitations).  08/13/12  Yes Renae FickleMackenzie Short, MD  traZODone (DESYREL) 50 MG tablet Take 1 tablet (50 mg total) by mouth at bedtime. 04/26/15  Yes Adline PotterJennifer A Griffin, NP  B Complex-C (B-COMPLEX WITH VITAMIN C) tablet Take 1 tablet by mouth daily.    Historical Provider, MD  metroNIDAZOLE (FLAGYL) 500 MG tablet Take 1 tablet (500 mg total) by mouth 2 (two) times daily. 04/26/15   Adline PotterJennifer A Griffin, NP    ALLERGIES:  Allergies  Allergen Reactions  . Penicillins Anaphylaxis  . Lidocaine Other (See Comments)    hallucinations    SOCIAL HISTORY:  Social History  Substance Use Topics  . Smoking status: Never Smoker   . Smokeless tobacco: Never Used  . Alcohol Use: No     Comment: not now    FAMILY HISTORY: Family History  Problem Relation Age of Onset  . Stomach  cancer Paternal Grandfather     colon cancer  . Cancer Paternal Grandfather     throat and esophagus  . Breast cancer Maternal Grandmother   . Cancer Maternal Grandmother     skin  . Anxiety disorder Maternal Grandmother   . Hypertension Mother   . Other Mother     fatty liver  . Hyperlipidemia Mother   . Other Father     varicose veins; stomach issues; hernia  . Hypertension Father   . Hyperlipidemia Father   . Cancer Father     prostate  . Arthritis Father     rheumatoid  . Thyroid disease Sister   . Other Brother     hernia  . Diabetes Maternal Grandfather   . Other Paternal Grandmother     hernia  . COPD Paternal Grandmother    . Liver disease Mother     fatty liver, does not drink.     EXAM: BP 152/101 mmHg  Pulse 128  Temp(Src) 98.2 F (36.8 C) (Oral)  Resp 18  Ht  (1.626 m)  Wt 160 lb (72.576 kg)  BMI 27.45 kg/m2  SpO2 96%  LMP 05/22/2015 CONSTITUTIONAL: Alert and oriented and responds appropriately to questions. Appears uncomfortable, tremulous, does not appear intoxicated HEAD: Normocephalic EYES: Conjunctivae clear, PERRL ENT: normal nose; no rhinorrhea; moist mucous membranes NECK: Supple, no meningismus, no LAD  CARD: Regular and tachycardic; S1 and S2 appreciated; no murmurs, no clicks, no rubs, no gallops RESP: Normal chest excursion without splinting or tachypnea; breath sounds clear and equal bilaterally; no wheezes, no rhonchi, no rales, no hypoxia or respiratory distress, speaking full sentences ABD/GI: Normal bowel sounds; non-distended; soft, minimally tender throughout the left abdomen but most of her tenderness is in the right mid to right lower quadrant, no rebound, no guarding, no peritoneal signs BACK:  The back appears normal and is non-tender to palpation, there is no CVA tenderness EXT: Normal ROM in all joints; non-tender to palpation; no edema; normal capillary refill; no cyanosis, no calf tenderness or swelling    SKIN: Normal color for age and race; warm; no rash NEURO: Moves all extremities equally, sensation to light touch intact diffusely, cranial nerves II through XII intact PSYCH: The patient's mood and manner are appropriate. Grooming and personal hygiene are appropriate.  MEDICAL DECISION MAKING: Patient here with multiple different complaints. She is complaining of right-sided abdominal pain, dysuria, vaginal discharge, nausea and vomiting. She is tender at McBurney's point. Differential diagnosis includes appendicitis, colitis, kidney stones, UTI, TOA, PID. Will obtain labs, CT imaging of her abdomen, pelvic exam with cultures.  Patient also complaining of feeling  anxious, tremulous with palpitations. She suspects that this is alcohol withdrawal. Last drink was almost 48 hours ago. Denies any drug use. We'll give IV fluids, IV Ativan and reassess.  ED PROGRESS: Patient's labs are unremarkable other than alcohol level of 7. No leukocytosis. LFTs, lipase normal. Urine shows trace hemoglobin and a large amount of white blood cells. Culture is pending. Will treat with IV Cipro given she has a history of anaphylaxis to penicillin. CT scan pending. We'll perform pelvic exam.  Patient's heart rate is improving with IV fluids and Ativan. She is now less tremulous and able to drink fluids. CT scan shows hepatic steatosis which she has had the past. No bowel obstruction, colitis. Appendix is normal. She does have a 1.8 cm left ovarian follicle/corpus luteum. She has no significant tenderness on the left side. Doubt torsion. Pelvic exam  reveals no adnexal tenderness or mass, no cervical motion tenderness. She does have a large amount of copious foul-smelling white, yellow vaginal discharge. Wet prep, gonorrhea chlamydia cultures pending. Given concern for possible gonorrhea and chlamydia, will treat with ceftriaxone and azithromycin. We'll monitor her closely after she has received ceftriaxone.   Patient did well after receiving Rocephin IM. Wet prep is positive for clue cells. We'll treat with Flagyl. She has been covered empirically with ceftriaxone and azithromycin for gonorrhea and chlamydia. No sign of kidney stone or kidney infection on CT imaging. No sign of appendicitis. She is still tachycardic with CIWA score of 12. I have recommended admission for alcohol withdrawal, detox. She declines this. States that she wants to go home. States that she has done well in the past on outpatient oral benzodiazepine taper. She states she is interested in quitting drinking but does not want inpatient treatment at this time. She is not intoxicated, oriented 3 and has no significant  complaints currently.  We'll discharge her with prescription for Librium taper, thiamine and folate. Have also provided her with outpatient resources.   Discharge her with Flagyl for her bacterial vaginosis. Discussed with her that she cannot drink alcohol when taking this medication. Will also discharge with Cipro for her possible UTI. Culture is pending. She has had no further vomiting in the emergency department. She does not want to stay for further workup or for her heart rate to improve further. She still tachycardic in the 110's.  Provided with outpatient follow-up information. Discussed return precautions. Patient verbalizes understanding is comfortable with this plan. Again have offered admission several times given she does have signs of alcohol withdrawal with tachycardia, tremors, nausea and vomiting but she adamantly declines. I feel she has capacity to make this decision for herself. Have discussed with her she changes her mind to return to the emergency department immediately. Discussed either return precautions. She verbalized understanding and is comfortable with this plan.   EKG Interpretation  Date/Time:  Thursday June 27 2015 02:43:59 EDT Ventricular Rate:  122 PR Interval:  130 QRS Duration: 85 QT Interval:  336 QTC Calculation: 479 R Axis:   36 Text Interpretation:  Sinus tachycardia Abnormal R-wave progression, early transition No significant change since last tracing other than rate is faster Confirmed by WARD,  DO, KRISTEN ([id number]) on 06/27/2015 2:48:03 AM         Layla Maw Ward, DO 06/27/15 0830

## 2015-06-27 NOTE — ED Notes (Signed)
Pt reports right lower abd pain with vomiting, states her urine also has a foul smell.

## 2015-06-27 NOTE — Discharge Instructions (Signed)
Alcohol Withdrawal Alcohol withdrawal is a group of symptoms that can develop when a person who drinks heavily and regularly stops drinking or drinks less. CAUSES Heavy and regular drinking can cause chemicals that send signals from the brain to the body (neurotransmitters) to deactivate. Alcohol withdrawal develops when deactivated neurotransmitters reactivate because a person stops drinking or drinks less. RISK FACTORS The more a person drinks and the longer he or she drinks, the greater the risk of alcohol withdrawal. Severe withdrawal is more likely to develop in someone who:  Had severe alcohol withdrawal in the past.  Had a seizure during a previous episode of alcohol withdrawal.  Is elderly.  Is pregnant.  Has been abusing drugs.  Has other medical problems, including:  Infection.  Heart, lung, or liver disease.  Seizures.  Mental health problems. SYMPTOMS Symptoms of this condition can be mild to moderate, or they can be severe. Mild to moderate symptoms may include:  Fatigue.  Nightmares.  Trouble sleeping.  Depression.  Anxiety.  Inability to think clearly.  Mood swings.  Irritability.  Loss of appetite.  Nausea or vomiting.  Clammy skin.  Extreme sweating.  Rapid heartbeat.  Shakiness.  Uncontrollable shaking (tremor). Severe symptoms may include:  Fever.  Seizures.  Severeconfusion.  Feeling or seeing things that are not there (hallucinations). Symptoms usually begin within eight hours after a person stops drinking or drinks less. They can last for weeks. DIAGNOSIS Alcohol withdrawal is diagnosed with a medical history and physical exam. Sometimes, urine and blood tests are also done. TREATMENT Treatment may involve:  Monitoring blood pressure, pulse, and breathing.  Getting fluids through an IV tube.  Medicine to reduce anxiety.  Medicine to prevent or control seizures.  Multivitamins and B vitamins.  Having a health  care provider check on you daily. If symptoms are moderate to severe or if there is a risk of severe withdrawal, treatment may be done at a hospital or treatment center. HOME CARE INSTRUCTIONS  Take medicines and vitamin supplements only as directed by your health care provider.  Do not drink alcohol.  Have someone stay with you or be available if you need help.  Drink enough fluid to keep your urine clear or pale yellow.  Consider joining a 12-step program or another alcohol support group. SEEK MEDICAL CARE IF:  Your symptoms get worse or do not go away.  You cannot keep food or water in your stomach.  You are struggling with not drinking alcohol.  You cannot stop drinking alcohol. SEEK IMMEDIATE MEDICAL CARE IF:   You have an irregular heartbeat.  You have chest pain.  You have trouble breathing.  You have symptoms of severe withdrawal, such as:  A fever.  Seizures.  Severe confusion.  Hallucinations.   This information is not intended to replace advice given to you by your health care provider. Make sure you discuss any questions you have with your health care provider.   Document Released: 12/31/2004 Document Revised: 04/13/2014 Document Reviewed: 01/09/2014 Elsevier Interactive Patient Education 2016 Elsevier Inc.  Bacterial Vaginosis Bacterial vaginosis is a vaginal infection that occurs when the normal balance of bacteria in the vagina is disrupted. It results from an overgrowth of certain bacteria. This is the most common vaginal infection in women of childbearing age. Treatment is important to prevent complications, especially in pregnant women, as it can cause a premature delivery. CAUSES  Bacterial vaginosis is caused by an increase in harmful bacteria that are normally present in smaller amounts  in the vagina. Several different kinds of bacteria can cause bacterial vaginosis. However, the reason that the condition develops is not fully understood. RISK  FACTORS Certain activities or behaviors can put you at an increased risk of developing bacterial vaginosis, including:  Having a new sex partner or multiple sex partners.  Douching.  Using an intrauterine device (IUD) for contraception. Women do not get bacterial vaginosis from toilet seats, bedding, swimming pools, or contact with objects around them. SIGNS AND SYMPTOMS  Some women with bacterial vaginosis have no signs or symptoms. Common symptoms include:  Grey vaginal discharge.  A fishlike odor with discharge, especially after sexual intercourse.  Itching or burning of the vagina and vulva.  Burning or pain with urination. DIAGNOSIS  Your health care provider will take a medical history and examine the vagina for signs of bacterial vaginosis. A sample of vaginal fluid may be taken. Your health care provider will look at this sample under a microscope to check for bacteria and abnormal cells. A vaginal pH test may also be done.  TREATMENT  Bacterial vaginosis may be treated with antibiotic medicines. These may be given in the form of a pill or a vaginal cream. A second round of antibiotics may be prescribed if the condition comes back after treatment. Because bacterial vaginosis increases your risk for sexually transmitted diseases, getting treated can help reduce your risk for chlamydia, gonorrhea, HIV, and herpes. HOME CARE INSTRUCTIONS   Only take over-the-counter or prescription medicines as directed by your health care provider.  If antibiotic medicine was prescribed, take it as directed. Make sure you finish it even if you start to feel better.  Tell all sexual partners that you have a vaginal infection. They should see their health care provider and be treated if they have problems, such as a mild rash or itching.  During treatment, it is important that you follow these instructions:  Avoid sexual activity or use condoms correctly.  Do not douche.  Avoid alcohol as  directed by your health care provider.  Avoid breastfeeding as directed by your health care provider. SEEK MEDICAL CARE IF:   Your symptoms are not improving after 3 days of treatment.  You have increased discharge or pain.  You have a fever. MAKE SURE YOU:   Understand these instructions.  Will watch your condition.  Will get help right away if you are not doing well or get worse. FOR MORE INFORMATION  Centers for Disease Control and Prevention, Division of STD Prevention: SolutionApps.co.za American Sexual Health Association (ASHA): www.ashastd.org    This information is not intended to replace advice given to you by your health care provider. Make sure you discuss any questions you have with your health care provider.   Document Released: 03/23/2005 Document Revised: 04/13/2014 Document Reviewed: 11/02/2012 Elsevier Interactive Patient Education 2016 Elsevier Inc.  Nausea and Vomiting Nausea is a sick feeling that often comes before throwing up (vomiting). Vomiting is a reflex where stomach contents come out of your mouth. Vomiting can cause severe loss of body fluids (dehydration). Children and elderly adults can become dehydrated quickly, especially if they also have diarrhea. Nausea and vomiting are symptoms of a condition or disease. It is important to find the cause of your symptoms. CAUSES   Direct irritation of the stomach lining. This irritation can result from increased acid production (gastroesophageal reflux disease), infection, food poisoning, taking certain medicines (such as nonsteroidal anti-inflammatory drugs), alcohol use, or tobacco use.  Signals from the brain.These signals  could be caused by a headache, heat exposure, an inner ear disturbance, increased pressure in the brain from injury, infection, a tumor, or a concussion, pain, emotional stimulus, or metabolic problems.  An obstruction in the gastrointestinal tract (bowel obstruction).  Illnesses such as  diabetes, hepatitis, gallbladder problems, appendicitis, kidney problems, cancer, sepsis, atypical symptoms of a heart attack, or eating disorders.  Medical treatments such as chemotherapy and radiation.  Receiving medicine that makes you sleep (general anesthetic) during surgery. DIAGNOSIS Your caregiver may ask for tests to be done if the problems do not improve after a few days. Tests may also be done if symptoms are severe or if the reason for the nausea and vomiting is not clear. Tests may include:  Urine tests.  Blood tests.  Stool tests.  Cultures (to look for evidence of infection).  X-rays or other imaging studies. Test results can help your caregiver make decisions about treatment or the need for additional tests. TREATMENT You need to stay well hydrated. Drink frequently but in small amounts.You may wish to drink water, sports drinks, clear broth, or eat frozen ice pops or gelatin dessert to help stay hydrated.When you eat, eating slowly may help prevent nausea.There are also some antinausea medicines that may help prevent nausea. HOME CARE INSTRUCTIONS   Take all medicine as directed by your caregiver.  If you do not have an appetite, do not force yourself to eat. However, you must continue to drink fluids.  If you have an appetite, eat a normal diet unless your caregiver tells you differently.  Eat a variety of complex carbohydrates (rice, wheat, potatoes, bread), lean meats, yogurt, fruits, and vegetables.  Avoid high-fat foods because they are more difficult to digest.  Drink enough water and fluids to keep your urine clear or pale yellow.  If you are dehydrated, ask your caregiver for specific rehydration instructions. Signs of dehydration may include:  Severe thirst.  Dry lips and mouth.  Dizziness.  Dark urine.  Decreasing urine frequency and amount.  Confusion.  Rapid breathing or pulse. SEEK IMMEDIATE MEDICAL CARE IF:   You have blood or brown  flecks (like coffee grounds) in your vomit.  You have black or bloody stools.  You have a severe headache or stiff neck.  You are confused.  You have severe abdominal pain.  You have chest pain or trouble breathing.  You do not urinate at least once every 8 hours.  You develop cold or clammy skin.  You continue to vomit for longer than 24 to 48 hours.  You have a fever. MAKE SURE YOU:   Understand these instructions.  Will watch your condition.  Will get help right away if you are not doing well or get worse.   This information is not intended to replace advice given to you by your health care provider. Make sure you discuss any questions you have with your health care provider.   Document Released: 03/23/2005 Document Revised: 06/15/2011 Document Reviewed: 08/20/2010 Elsevier Interactive Patient Education 2016 Elsevier Inc.  Urinary Tract Infection Urinary tract infections (UTIs) can develop anywhere along your urinary tract. Your urinary tract is your body's drainage system for removing wastes and extra water. Your urinary tract includes two kidneys, two ureters, a bladder, and a urethra. Your kidneys are a pair of bean-shaped organs. Each kidney is about the size of your fist. They are located below your ribs, one on each side of your spine. CAUSES Infections are caused by microbes, which are microscopic organisms,  including fungi, viruses, and bacteria. These organisms are so small that they can only be seen through a microscope. Bacteria are the microbes that most commonly cause UTIs. SYMPTOMS  Symptoms of UTIs may vary by age and gender of the patient and by the location of the infection. Symptoms in young women typically include a frequent and intense urge to urinate and a painful, burning feeling in the bladder or urethra during urination. Older women and men are more likely to be tired, shaky, and weak and have muscle aches and abdominal pain. A fever may mean the  infection is in your kidneys. Other symptoms of a kidney infection include pain in your back or sides below the ribs, nausea, and vomiting. DIAGNOSIS To diagnose a UTI, your caregiver will ask you about your symptoms. Your caregiver will also ask you to provide a urine sample. The urine sample will be tested for bacteria and white blood cells. White blood cells are made by your body to help fight infection. TREATMENT  Typically, UTIs can be treated with medication. Because most UTIs are caused by a bacterial infection, they usually can be treated with the use of antibiotics. The choice of antibiotic and length of treatment depend on your symptoms and the type of bacteria causing your infection. HOME CARE INSTRUCTIONS  If you were prescribed antibiotics, take them exactly as your caregiver instructs you. Finish the medication even if you feel better after you have only taken some of the medication.  Drink enough water and fluids to keep your urine clear or pale yellow.  Avoid caffeine, tea, and carbonated beverages. They tend to irritate your bladder.  Empty your bladder often. Avoid holding urine for long periods of time.  Empty your bladder before and after sexual intercourse.  After a bowel movement, women should cleanse from front to back. Use each tissue only once. SEEK MEDICAL CARE IF:   You have back pain.  You develop a fever.  Your symptoms do not begin to resolve within 3 days. SEEK IMMEDIATE MEDICAL CARE IF:   You have severe back pain or lower abdominal pain.  You develop chills.  You have nausea or vomiting.  You have continued burning or discomfort with urination. MAKE SURE YOU:   Understand these instructions.  Will watch your condition.  Will get help right away if you are not doing well or get worse.   This information is not intended to replace advice given to you by your health care provider. Make sure you discuss any questions you have with your health  care provider.   Document Released: 12/31/2004 Document Revised: 12/12/2014 Document Reviewed: 05/01/2011 Elsevier Interactive Patient Education 2016 [employer].   [employer] Guide Outpatient Counseling/Substance Abuse Adult The United Ways 211 is a great source of information about community services available.  Access by dialing 2-1-1 from anywhere in [place], or by website -  PooledIncome.pl.   Other Local Resources (Updated 04/2015)  Crisis Hotlines   Services     Area Served  [employer]  Crisis Hotline, available 24 hours a day, 7 days a week: 814-386-4453 [facility], [place]   [facility]  Crisis Hotline, available 24 hours a day, 7 days a week: [phone] [facility], [place]  [facility]  Suicide Prevention Hotline, available 24 hours a day, 7 days a week: 518-423-7641 [facility], [place]  [employer], available 24 hours a day, 7 days a week: (205) 320-0615 [place], [place]   [place]  [facility] Access to Care Line  Crisis Hotline, available 24 hours a day, 7 days a week: 867-339-2478 All   [facility]  Crisis Hotline, available 24 hours a day, 7 days a week: (845) 472-7404 All   Other Local Resources (Updated 04/2015)  Outpatient Counseling/ Substance Abuse Programs  Services     Address and Phone Number  ADS ([facility] Drug [facility])   Options include Individual counseling, group counseling, intensive outpatient program (several hours a day, several days a week)  Offers depression assessments  Provides methadone maintenance program [phone] [address], [address] [place], [place] [address]   [facility]   Offers partial hospitalization/day treatment and DUI/DWI programs  [employer], private insurance [phone] [address] [place], [place] [address]  [facility]    Services include intensive outpatient program (several  hours a day, several days a week), outpatient treatment, DUI/DWI services, family education  Also has some services specifically for [employer] transitional housing  [phone] [address] [place], [place] [address]     [facility]  [employer], private pay, and private insurance [phone] [address] [place], [place] [address]  [employer] of [facility]  Services include individual counseling, substance abuse intensive outpatient program (several hours a day, several days a week), day treatment  Delene Loll, Medicaid, private insurance [phone] [address], [address] [place], [place] [address]  [facility]   Offers substance abuse intensive outpatient program (several hours a day, several days a week), partial hospitalization program [phone] [address] [place], [place] [address]  [phone] [address] [place], [place] [address]  [phone] [address] [place], [place] [address]  [phone], [address] [place], [place] [address]  [facility]  Individual counseling only  Accepts private insurance only [phone] [address] [place], [place] [address]  [facility]  Methadone maintenance program [phone] [address] [place], [place] [address]  [facility]  Walk-In Clinic providing substance abuse and mental health counseling  Accepts Medicaid, Medicare, private insurance  Offers sliding scale for uninsured [phone] [address] [place], [place]   [facility] [place], [employer].  Offers individual counseling, and intensive in-home services [phone] [address], [address] [place], [place] [address]  [facility] individual counseling, family counseling, group therapy, domestic violence counseling, consumer credit  counseling  Accepts Medicare, Medicaid, private insurance  Offers sliding scale for uninsured [phone] [address] [place], [place] [address]  [phone] [facility], [address] [place], [place] [address]  [facility]  Offers individual, family and group counseling  3 locations - [place], [place], and [place]  [phone]  [address] [place], [place] [address]  [address] [place], [place] [address]  [address] [place], [place] [address]  [facility]    Offers psychiatric assessment, 8-week Intensive Outpatient Program (several hours a day, several times a week, daytime or evenings), early recovery group, family Program, medication management  Private pay or private insurance only [phone], or  [phone] [address] [place], [place] [address]  [place] [employer] individual, couples and family counseling  Accepts Medicaid, private insurance, and sliding scale for uninsured [phone] [address] [place], [place] [address]  Len Blalock, MD  Individual counseling  Private insurance [phone] [address] [place], [place] [address]  [facility]  Offers assessment, substance abuse treatment, and behavioral health treatment [phone] [address] [place], [place] [address]  [facility]  Individual counseling  Accepts private insurance [phone] [address] [place], [place] [address]  [facility]  Individual counseling  Delene Loll, private insurance [phone] [address] [place], [place] [address]  [facility]    Offers intensive outpatient program (several hours a day, several times a week)  Private pay, private insurance [phone] [facility] [place], [place]  [facility]  Individual counseling  Medicare, private insurance [phone] [address] [place], [place] [address]  [facility]    Offers intensive outpatient program (several hours a day, several times a week) and partial hospitalization program [phone] [address] [place], [place] [address]  Emerson Monte, MD  Individual counseling [phone] [address] [place], [place] [address]  [facility]  Offers Christian counseling to individuals, couples, and families  Accepts Medicare and private insurance; offers sliding scale for uninsured [phone] [address] [place], [place] [address]  [facility]  [place] [facility] [phone] [address] [place], [place] [address]  [facility] crisis counseling, individual counseling, group therapy, in-home therapy, domestic violence services, day treatment, DWI services, Administrator, arts (CST), Assertive Community Treatment Team (ACTT), substance abuse Intensive Outpatient Program (several hours a day, several times a week)  2 locations - [place] and [place] [phone] [address] [place], [place] [address]  [phone] [address] [place], [place] [address]  [facility]     Individual counseling and group therapy  Accepts private insurance, [place], [place] [phone] [address] [place], [place]  [facility]  Offers individual and family counseling  Offers LGBTQ services  Accepts private insurance and private pay [phone] [address] [place], [place] [address]  [facility]    Offers individual counseling, group therapy, and outpatient detox  Accepts private insurance [phone] [address] [place], [place]  [facility]  Individual counseling  Accepts Medicare, private insurance [phone] [address] [place], [place] [address]   [employer]  Individual counseling  Accepts Medicare, private insurance [phone] [address] [place], [place] [address]  [facility]   Offers substance abuse Intensive Outpatient Program (several hours a day, several times a week) [phone], or [phone] [place], [place]    [employer] Guide Inpatient Behavioral Health/Residential  Substance Abuse Treatment Adults The United Ways 211 is a great source of information about community services available.  Access by dialing 2-1-1 from anywhere in [place], or by website -  PooledIncome.pl.   (Updated 04/2015)  Crisis Assistance 24 hours a day   Services Offered    Area [employer]  24-hour crisis assistance: [phone] [place], [place]   [facility]  24-hour crisis [phone] [place], [place]  [place]   24-hour crisis assistance: [phone] [place], [place]   [facility] Access to Care Line  24-hour crisis assistance; [phone] All   [facility]  24-hour crisis response line: 832-511-0646 All   Other Local Resources (Updated 04/2015)  Inpatient Behavioral Health/Residential Substance Abuse Treatment Programs   Services      Address and Phone Number  [facility] ([facility])   14-day residential rehabilitation  [phone] [address] [place], [place]  [facility] ([facility])  Detox - private pay only  14-day residential rehabilitation -  Medicaid, insurance, private pay only [phone], or [phone] [address], [place], [place] [address]   [facility]  Private Insurance only  Multiple facilities [phone] admissions   [facility] ([facility])   90-day program  Must be homeless to participate  [phone], or [phone] [place], [place] [facility]  [facility]     Private  Insurance only [phone], or  [phone] [address] [place], [place] [address]  [facility]     Must make an appointment  Transportation is offered from [employer] on [address]  Accepts private pay, Sheryn BisonMedicare, [facility] Medicaid [phone]  [address] [place], [place] [phone]   [employer]  Females only  Associated with [facility] [phone] [place], [place] [phone]  [facility]   Private insurance only [phone], or [phone] [address] [place], [id number]  [facility]    Detox  Residential rehabilitation  Private insurance only  Multiple locations [phone] admissions  [facility]    Private pay  Private insurance [phone] [address] [place], [place] [id number]  [facility]    Males only  Fee required at time of admission [phone] [address] [place], [place] [address]  [facility]    Private pay only  [phone] [place], [place]  [facility] ([facility])    Detox - private pay, Medicaid  Residential rehabilitation for males  - Medicare, Medicaid, insurance, private pay [phone] [address] [place], [place]   [facility]    Walk-in interviews Monday - Saturday from 8 am - 4 pm  Individuals with legal charges are not eligible [phone] [address] [place], [place] [address]  [facility] [employer]   Must be willing to work  Must attend Alcoholics Anonymous meetings [phone] [address] [place], [place]   [facility]    Faith-based program  Private pay only [phone] [address] [place], [place]

## 2015-06-28 LAB — GC/CHLAMYDIA PROBE AMP (~~LOC~~) NOT AT ARMC
CHLAMYDIA, DNA PROBE: NEGATIVE
NEISSERIA GONORRHEA: NEGATIVE

## 2015-06-29 LAB — URINE CULTURE: Culture: 30000

## 2015-06-30 ENCOUNTER — Telehealth: Payer: Self-pay | Admitting: *Deleted

## 2015-06-30 NOTE — ED Notes (Signed)
(+)  urine culture reviewed by Ronnell FreshwaterS. Joy, PA, treated with Cipro and Metronidazole, no changes needed.

## 2015-08-28 ENCOUNTER — Encounter (HOSPITAL_COMMUNITY): Payer: Self-pay | Admitting: *Deleted

## 2015-08-28 ENCOUNTER — Emergency Department (HOSPITAL_COMMUNITY)
Admission: EM | Admit: 2015-08-28 | Discharge: 2015-08-28 | Disposition: A | Discharge status: Home / Self Care | Payer: Self-pay | Attending: Emergency Medicine | Admitting: Emergency Medicine

## 2015-08-28 ENCOUNTER — Emergency Department (HOSPITAL_COMMUNITY): Payer: Self-pay

## 2015-08-28 DIAGNOSIS — R1011 Right upper quadrant pain: Secondary | ICD-10-CM | POA: Insufficient documentation

## 2015-08-28 DIAGNOSIS — J45909 Unspecified asthma, uncomplicated: Secondary | ICD-10-CM | POA: Insufficient documentation

## 2015-08-28 DIAGNOSIS — R000 Tachycardia, unspecified: Secondary | ICD-10-CM | POA: Insufficient documentation

## 2015-08-28 DIAGNOSIS — R112 Nausea with vomiting, unspecified: Secondary | ICD-10-CM | POA: Insufficient documentation

## 2015-08-28 DIAGNOSIS — R109 Unspecified abdominal pain: Secondary | ICD-10-CM

## 2015-08-28 DIAGNOSIS — E039 Hypothyroidism, unspecified: Secondary | ICD-10-CM | POA: Insufficient documentation

## 2015-08-28 DIAGNOSIS — R509 Fever, unspecified: Secondary | ICD-10-CM | POA: Insufficient documentation

## 2015-08-28 DIAGNOSIS — I10 Essential (primary) hypertension: Secondary | ICD-10-CM | POA: Insufficient documentation

## 2015-08-28 LAB — CBC WITH DIFFERENTIAL/PLATELET
Basophils Absolute: 0.1 10*3/uL (ref 0.0–0.1)
Basophils Relative: 1 %
Eosinophils Absolute: 0 10*3/uL (ref 0.0–0.7)
Eosinophils Relative: 1 %
HCT: 40.2 % (ref 36.0–46.0)
Hemoglobin: 14.1 g/dL (ref 12.0–15.0)
Lymphocytes Relative: 26 %
Lymphs Abs: 1.3 10*3/uL (ref 0.7–4.0)
MCH: 35.7 pg — ABNORMAL HIGH (ref 26.0–34.0)
MCHC: 35.1 g/dL (ref 30.0–36.0)
MCV: 101.8 fL — ABNORMAL HIGH (ref 78.0–100.0)
Monocytes Absolute: 0.3 10*3/uL (ref 0.1–1.0)
Monocytes Relative: 7 %
Neutro Abs: 3.2 10*3/uL (ref 1.7–7.7)
Neutrophils Relative %: 65 %
Platelets: 194 10*3/uL (ref 150–400)
RBC: 3.95 MIL/uL (ref 3.87–5.11)
RDW: 16.6 % — ABNORMAL HIGH (ref 11.5–15.5)
WBC: 4.9 10*3/uL (ref 4.0–10.5)

## 2015-08-28 LAB — URINE MICROSCOPIC-ADD ON: RBC / HPF: NONE SEEN RBC/hpf (ref 0–5)

## 2015-08-28 LAB — COMPREHENSIVE METABOLIC PANEL
ALT: 29 U/L (ref 14–54)
AST: 63 U/L — ABNORMAL HIGH (ref 15–41)
Albumin: 3.9 g/dL (ref 3.5–5.0)
Alkaline Phosphatase: 65 U/L (ref 38–126)
Anion gap: 10 (ref 5–15)
BUN: 10 mg/dL (ref 6–20)
CO2: 23 mmol/L (ref 22–32)
Calcium: 8.6 mg/dL — ABNORMAL LOW (ref 8.9–10.3)
Chloride: 105 mmol/L (ref 101–111)
Creatinine, Ser: 0.73 mg/dL (ref 0.44–1.00)
GFR calc Af Amer: >60 mL/min (ref >60)
GFR calc non Af Amer: >60 mL/min (ref >60)
Glucose, Bld: 99 mg/dL (ref 65–99)
Potassium: 3.7 mmol/L (ref 3.5–5.1)
Sodium: 138 mmol/L (ref 135–145)
Total Bilirubin: 1.2 mg/dL (ref 0.3–1.2)
Total Protein: 7.2 g/dL (ref 6.5–8.1)

## 2015-08-28 LAB — URINALYSIS, ROUTINE W REFLEX MICROSCOPIC
Bilirubin Urine: NEGATIVE
Glucose, UA: NEGATIVE mg/dL
Hgb urine dipstick: NEGATIVE
Ketones, ur: NEGATIVE mg/dL
Leukocytes, UA: NEGATIVE
Nitrite: NEGATIVE
Protein, ur: 30 mg/dL — AB
Specific Gravity, Urine: 1.01 (ref 1.005–1.030)
pH: 7.5 (ref 5.0–8.0)

## 2015-08-28 LAB — LACTIC ACID, PLASMA: Lactic Acid, Venous: 2.2 mmol/L (ref 0.5–2.0)

## 2015-08-28 LAB — ETHANOL: Alcohol, Ethyl (B): <5 mg/dL (ref <5)

## 2015-08-28 LAB — LIPASE, BLOOD: Lipase: 30 U/L (ref 11–51)

## 2015-08-28 LAB — PREGNANCY, URINE: Preg Test, Ur: NEGATIVE

## 2015-08-28 MED ORDER — PANTOPRAZOLE SODIUM 40 MG IV SOLR
40.0000 mg | Freq: Once | INTRAVENOUS | Status: AC
  Administered 2015-08-28: 40 mg via INTRAVENOUS
  Filled 2015-08-28: qty 40

## 2015-08-28 MED ORDER — ONDANSETRON HCL 4 MG/2ML IJ SOLN
4.0000 mg | Freq: Once | INTRAMUSCULAR | Status: AC
  Administered 2015-08-28: 4 mg via INTRAVENOUS
  Filled 2015-08-28: qty 2

## 2015-08-28 MED ORDER — SODIUM CHLORIDE 0.9 % IV BOLUS (SEPSIS)
1000.0000 mL | Freq: Once | INTRAVENOUS | Status: AC
  Administered 2015-08-28: 1000 mL via INTRAVENOUS

## 2015-08-28 MED ORDER — ONDANSETRON 4 MG PO TBDP: ORAL_TABLET | ORAL | Status: DC

## 2015-08-28 MED ORDER — PANTOPRAZOLE SODIUM 20 MG PO TBEC: 20.0000 mg | DELAYED_RELEASE_TABLET | Freq: Every day | ORAL | Status: DC

## 2015-08-28 MED ORDER — LORAZEPAM 2 MG/ML IJ SOLN
0.5000 mg | Freq: Once | INTRAMUSCULAR | Status: AC
  Administered 2015-08-28: 0.5 mg via INTRAVENOUS
  Filled 2015-08-28: qty 1

## 2015-08-28 MED ORDER — IOPAMIDOL (ISOVUE-300) INJECTION 61%
75.0000 mL | Freq: Once | INTRAVENOUS | Status: AC | PRN
  Administered 2015-08-28: 75 mL via INTRAVENOUS

## 2015-08-28 MED ORDER — HYDROMORPHONE HCL 1 MG/ML IJ SOLN
1.0000 mg | Freq: Once | INTRAMUSCULAR | Status: AC
  Administered 2015-08-28: 1 mg via INTRAVENOUS
  Filled 2015-08-28: qty 1

## 2015-08-28 MED ORDER — HYDROCODONE-ACETAMINOPHEN 5-325 MG PO TABS: 1.0000 | ORAL_TABLET | Freq: Four times a day (QID) | ORAL | Status: DC | PRN

## 2015-08-28 NOTE — Discharge Instructions (Signed)
Follow up with dr. Darrick PennaFields next week.  Also follow up with a family md

## 2015-08-28 NOTE — ED Notes (Signed)
Pt c/o right flank pain with fever x 1 day

## 2015-08-28 NOTE — ED Provider Notes (Signed)
CSN: [id number]     Arrival date & time 08/28/15  2031 History  By signing my name below, I, Kara Stewart, attest that this documentation has been prepared under the direction and in the presence of Bethann Berkshire, MD. Electronically Signed: Octavia Heir, ED Scribe. 08/28/2015. 9:39 PM.     Chief Complaint  Patient presents with  . Flank Pain      Patient is a 40 y.o. female presenting with abdominal pain. The history is provided by the patient. No language interpreter was used.  Abdominal Pain Pain location:  RUQ Pain radiates to:  Does not radiate Pain severity:  Moderate Onset quality:  Sudden Duration:  1 day Timing:  Constant Progression:  Worsening Chronicity:  New Context: alcohol use   Relieved by:  Nothing Worsened by:  Vomiting Ineffective treatments: Zofran. Associated symptoms: nausea and vomiting   Associated symptoms: no cough, no diarrhea, no fatigue and no hematuria   Risk factors: alcohol abuse    HPI Comments: Kara Stewart is a 40 y.o. female who has a PMHx of SVT, ovarian cysts, RLQ abdominal pain presents to the Emergency Department complaining of sudden onset, gradual worsening, moderate RUQ abdominal pain onset yesterday. She reports having an associated subjective fever and vomiting (x5) that started this afternoon. Pt notes taking Zofran to alleviate her symptoms but reports vomiting it back up with no relief. Pt says she drinks about 4 drinks a day. She denies any other symptoms.  Past Medical History  Diagnosis Date  . SVT (supraventricular tachycardia) (HCC)   . Hiatal hernia   . Anxiety   . Acid reflux   . Dyspepsia 2004    Dx w/ PUD but no EGD, [place], [place]   . Hypertriglyceridemia   . Hypothyroid   . Iritis     frequent  . Hypertension   . Asthma   . Panic attacks   . Thyroid nodule   . RLQ abdominal pain 01/11/2014  . Vaginal discharge 01/11/2014  . Trichomonal vaginitis 01/11/2014  . Weight gain 01/11/2014  . Irregular  periods 01/11/2014  . 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 Alcohol abuse 07/21/2012  . Ovarian cyst   . Colitis   . History of colitis 11/29/2014  . BV (bacterial vaginosis) 04/26/2015  . History of alcohol abuse 04/26/2015   Past Surgical History  Procedure Laterality Date  . Esophagogastroduodenoscopy  11/06/2011    [name]: MILD Esophagitis/PATENT ESOPHAGEAL Stricture/  Moderate gastritis. Bx no.hpylori or celiac, +gastritis  . Esophagogastroduodenoscopy (egd) with propofol N/A 03/20/2014    [name]: 1. Mild esophagitis & distal esophagela stricture. 2. small hiatal hernia 3. moderate non-erosive gastritis and mild duodenits  . Savory dilation N/A 03/20/2014    Procedure: SAVORY DILATION;  Surgeon: West Bali, MD;  Location: [facility];  Service: Endoscopy;  Laterality: N/A;  dilated with # 12.8, 14,15,16  . Colonoscopy with propofol N/A 02/05/2015    NWG:NFAOZ HH/mild diverticulosis  . Biopsy N/A 02/05/2015    Procedure: BIOPSY;  Surgeon: West Bali, MD;  Location: [facility];  Service: Endoscopy;  Laterality: N/A;   Family History  Problem Relation Age of Onset  . Stomach cancer Paternal Grandfather     colon cancer  . Cancer Paternal Grandfather     throat and esophagus  . Breast cancer Maternal Grandmother   . Cancer Maternal Grandmother     skin  . Anxiety disorder Maternal Grandmother   . Hypertension Mother   . Other Mother     fatty liver  .  Hyperlipidemia Mother   . Other Father     varicose veins; stomach issues; hernia  . Hypertension Father   . Hyperlipidemia Father   . Cancer Father     prostate  . Arthritis Father     rheumatoid  . Thyroid disease Sister   . Other Brother     hernia  . Diabetes Maternal Grandfather   . Other Paternal Grandmother     hernia  . COPD Paternal Grandmother   . Liver disease Mother     fatty liver, does not drink.    Social History  Substance Use Topics  . Smoking status: Never Smoker   . Smokeless tobacco: Never Used  . Alcohol Use:  No     Comment: not now   OB History    Gravida Para Term Preterm AB TAB SAB Ectopic Multiple Living   1    1 1          Review of Systems  Constitutional: Negative for appetite change and fatigue.  HENT: Negative for congestion, ear discharge and sinus pressure.   Eyes: Negative for discharge.  Respiratory: Negative for cough.   Gastrointestinal: Positive for nausea, vomiting and abdominal pain. Negative for diarrhea.  Genitourinary: Negative for frequency and hematuria.  Musculoskeletal: Negative for back pain.  Skin: Negative for rash.  Neurological: Negative for seizures.  Psychiatric/Behavioral: Negative for hallucinations.      Allergies  Penicillins and Lidocaine  Home Medications   Prior to Admission medications   Medication Sig Start Date End Date Taking? Authorizing Provider  chlordiazePOXIDE (LIBRIUM) 25 MG capsule 50mg  PO TID x 1D, then 25-50mg  PO BID X 1D, then 25-50mg  PO QD X 1D 06/27/15   Kristen N Ward, DO  ciprofloxacin (CIPRO) 500 MG tablet Take 1 tablet (500 mg total) by mouth 2 (two) times daily. 06/27/15   Kristen N Ward, DO  FLUoxetine (PROZAC) 20 MG capsule Take 1 capsule (20 mg total) by mouth daily. 04/26/15   Adline Potter, NP  folic acid (FOLVITE) 1 MG tablet Take 1 tablet (1 mg total) by mouth daily. 06/27/15   Kristen N Ward, DO  LORazepam (ATIVAN) 1 MG tablet Take 1 tablet (1 mg total) by mouth every 6 (six) hours as needed (tremulousness, agitation). 01/24/15   Christiane Ha, MD  metoprolol tartrate (LOPRESSOR) 25 MG tablet Take 12.5 mg by mouth as needed (for palpitations).  08/13/12   Renae Fickle, MD  metroNIDAZOLE (FLAGYL) 500 MG tablet Take 1 tablet (500 mg total) by mouth 2 (two) times daily. Do not drink alcohol with this medication. 06/27/15   Kristen N Ward, DO  ondansetron (ZOFRAN ODT) 4 MG disintegrating tablet Take 1 tablet (4 mg total) by mouth every 8 (eight) hours as needed for nausea or vomiting. 06/27/15   Kristen N Ward, DO   thiamine (VITAMIN B-1) 100 MG tablet Take 1 tablet (100 mg total) by mouth daily. 06/27/15   Kristen N Ward, DO  traZODone (DESYREL) 50 MG tablet Take 1 tablet (50 mg total) by mouth at bedtime. 04/26/15   Adline Potter, NP   Triage vitals: BP 137/90 mmHg  Pulse 123  Temp(Src) 98.4 F (36.9 C) (Oral)  Resp 24  Ht 5\' 4"  (1.626 m)  Wt 160 lb (72.576 kg)  BMI 27.45 kg/m2  SpO2 100%  LMP 08/27/2015 Physical Exam  Constitutional: She is oriented to person, place, and time. She appears well-developed.  Very anxious  HENT:  Head: Normocephalic.  Eyes: Conjunctivae and EOM  are normal. No scleral icterus.  Neck: Neck supple. No thyromegaly present.  Cardiovascular: Regular rhythm.  Tachycardia present.  Exam reveals no gallop and no friction rub.   No murmur heard. Pulmonary/Chest: No stridor. She has no wheezes. She has no rales. She exhibits no tenderness.  Abdominal: She exhibits no distension. There is tenderness. There is no rebound.  Moderate RUQ tenderness  Musculoskeletal: Normal range of motion. She exhibits no edema.  Lymphadenopathy:    She has no cervical adenopathy.  Neurological: She is oriented to person, place, and time. She exhibits normal muscle tone. Coordination normal.  Skin: No rash noted. No erythema.  Psychiatric: She has a normal mood and affect. Her behavior is normal.  Nursing note and vitals reviewed.   ED Course  Procedures  DIAGNOSTIC STUDIES: Oxygen Saturation is 100% on RA, normal by my interpretation.  COORDINATION OF CARE:  9:38 PM Will order CT abdomen/pelvis with contrast. Discussed treatment plan which includes dilaudid, zofran, ativan, and protonix with pt at bedside and pt agreed to plan.  Labs Review Labs Reviewed  CBC WITH DIFFERENTIAL/PLATELET - Abnormal; Notable for the following:    MCV 101.8 (*)    MCH 35.7 (*)    RDW 16.6 (*)    All other components within normal limits  COMPREHENSIVE METABOLIC PANEL - Abnormal; Notable for  the following:    Calcium 8.6 (*)    AST 63 (*)    All other components within normal limits  URINALYSIS, ROUTINE W REFLEX MICROSCOPIC (NOT AT [facility])  PREGNANCY, URINE  LIPASE, BLOOD  ETHANOL  LACTIC ACID, PLASMA    Imaging Review No results found. I have personally reviewed and evaluated these images and lab results as part of my medical decision-making.   EKG Interpretation None      MDM   Final diagnoses:  None    Patient presented with severe right upper quadrant pain. CBC chemistries liver profile urinalysis are unremarkable. CT scan the abdomen unremarkable patient improved with protonic and pain medicine. Will treat patient for peptic ulcer problems and have her follow-up with her GI doctor. Patient given protonic Vicodin and Zofran  The chart was scribed for me under my direct supervision.  I personally performed the history, physical, and medical decision making and all procedures in the evaluation of this patient.Bethann Berkshire.   Yordy Matton, MD 08/28/15 2329

## 2015-08-28 NOTE — ED Notes (Signed)
CRITICAL VALUE ALERT  Critical value received:  Lactic acid 2.2  Date of notification:  05/31/15  Time of notification:  2228  Critical value read back:Yes.    Nurse who received alert:  Bronson CurbKristy Tinna Kolker, RN  MD notified (1st page):  Dr Estell HarpinZammit  Time of first page:  2228  MD notified (2nd page):  Time of second page:  Responding MD:  Dr Estell HarpinZammit  Time MD responded:  2228

## 2015-10-27 ENCOUNTER — Emergency Department (HOSPITAL_COMMUNITY): Payer: Self-pay

## 2015-10-27 ENCOUNTER — Encounter (HOSPITAL_COMMUNITY): Payer: Self-pay | Admitting: Emergency Medicine

## 2015-10-27 ENCOUNTER — Emergency Department (HOSPITAL_COMMUNITY)
Admission: EM | Admit: 2015-10-27 | Discharge: 2015-10-27 | Disposition: A | Discharge status: Home / Self Care | Payer: Self-pay | Attending: Emergency Medicine | Admitting: Emergency Medicine

## 2015-10-27 DIAGNOSIS — J45909 Unspecified asthma, uncomplicated: Secondary | ICD-10-CM | POA: Insufficient documentation

## 2015-10-27 DIAGNOSIS — I10 Essential (primary) hypertension: Secondary | ICD-10-CM | POA: Insufficient documentation

## 2015-10-27 DIAGNOSIS — Z7982 Long term (current) use of aspirin: Secondary | ICD-10-CM | POA: Insufficient documentation

## 2015-10-27 DIAGNOSIS — R42 Dizziness and giddiness: Secondary | ICD-10-CM | POA: Insufficient documentation

## 2015-10-27 DIAGNOSIS — F419 Anxiety disorder, unspecified: Secondary | ICD-10-CM | POA: Insufficient documentation

## 2015-10-27 LAB — BASIC METABOLIC PANEL
ANION GAP: 9 (ref 5–15)
BUN: 8 mg/dL (ref 6–20)
CALCIUM: 8.9 mg/dL (ref 8.9–10.3)
CO2: 25 mmol/L (ref 22–32)
CREATININE: 0.81 mg/dL (ref 0.44–1.00)
Chloride: 104 mmol/L (ref 101–111)
GFR calc non Af Amer: >60 mL/min (ref >60)
Glucose, Bld: 83 mg/dL (ref 65–99)
Potassium: 3.5 mmol/L (ref 3.5–5.1)
SODIUM: 138 mmol/L (ref 135–145)

## 2015-10-27 LAB — CBC
HEMATOCRIT: 37.9 % (ref 36.0–46.0)
HEMOGLOBIN: 14.4 g/dL (ref 12.0–15.0)
MCH: 40.1 pg — ABNORMAL HIGH (ref 26.0–34.0)
MCHC: 38 g/dL — AB (ref 30.0–36.0)
MCV: 105.3 fL — ABNORMAL HIGH (ref 78.0–100.0)
Platelets: 256 10*3/uL (ref 150–400)
RBC: 3.6 MIL/uL — ABNORMAL LOW (ref 3.87–5.11)
RDW: 13.5 % (ref 11.5–15.5)
WBC: 6.2 10*3/uL (ref 4.0–10.5)

## 2015-10-27 LAB — I-STAT TROPONIN, ED: Troponin i, poc: 0 ng/mL (ref 0.00–0.08)

## 2015-10-27 MED ORDER — LORAZEPAM 2 MG/ML IJ SOLN
1.0000 mg | Freq: Once | INTRAMUSCULAR | Status: AC
Start: 2015-10-27 — End: 2015-10-27
  Administered 2015-10-27: 1 mg via INTRAVENOUS
  Filled 2015-10-27: qty 1

## 2015-10-27 MED ORDER — LORAZEPAM 1 MG PO TABS: 1.0000 mg | ORAL_TABLET | Freq: Three times a day (TID) | ORAL | 0 refills | Status: DC | PRN

## 2015-10-27 NOTE — ED Triage Notes (Signed)
Patient c/o mid-sternal chest pain that radiates into left arm. Per patient has hx of SVT in which she thought it was so she took her metoprolol and is now dizzy. Patient states "When I stand up I'm seeing stars." Patient aldo reports shortness of breath and nausea. After patient laid back on stretcher for EKG she states that pain hurts more under left breast with laying.

## 2015-10-27 NOTE — ED Notes (Signed)
istat troponin initiated. Patient anxious. ST on monitor 115.

## 2015-10-27 NOTE — Discharge Instructions (Signed)
Use the medication as directed to help with anxiety and panic attacks.  Talk to your lawyer, and the police about your husband violating the restriction orders.  It is important to follow-up with a medical doctor for checkup in 3-7 days, to arrange further treatment if needed.

## 2015-10-27 NOTE — ED Provider Notes (Signed)
[facility]-EMERGENCY DEPT Provider Note   CSN: [id number] Arrival date & time: 10/27/15  7829  First Provider Contact:  9:53 AM By signing my name below, I, Freida Busman, attest that this documentation has been prepared under the direction and in the presence of Mancel Bale, MD . Electronically Signed: Freida Busman, Scribe. 10/27/2015. 10:25 AM.    History   Chief Complaint Chief Complaint  Patient presents with  . Chest Pain    The history is provided by the patient. No language interpreter was used.     HPI Comments:  Kara Stewart is a 40 y.o. female who presents to the Emergency Department complaining of mild SOB with associated chest tightness which began ~0700 this AM. She also notes intermittent, lightheadedness. Pt reports h/o SVT, notes her symptoms are similar to what she experiences prior to an episode of SVT however she denies palpitations. Pt also has a h/o anxiety. She notes she is currently worried about her ex-husband who she has a restraining order against; states she's "worried about what he might do". Pt is not currently on anxiety meds but notes she used to take Ativan. No alleviating factors noted.   Past Medical History:  Diagnosis Date  . Acid reflux   . Alcohol abuse 07/21/2012  . 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 Anxiety   . Asthma   . BV (bacterial vaginosis) 04/26/2015  . Colitis   . Dyspepsia 2004   Dx w/ PUD but no EGD, [place], [place]   . Hiatal hernia   . History of alcohol abuse 04/26/2015  . History of colitis 11/29/2014  . Hypertension   . Hypertriglyceridemia   . Hypothyroid   . Iritis    frequent  . Irregular periods 01/11/2014  . Ovarian cyst   . Panic attacks   . RLQ abdominal pain 01/11/2014  . SVT (supraventricular tachycardia) (HCC)   . Thyroid nodule   . Trichomonal vaginitis 01/11/2014  . Vaginal discharge 01/11/2014  . Weight gain 01/11/2014    Patient Active Problem List   Diagnosis Date Noted  . BV (bacterial vaginosis)  04/26/2015  . History of alcohol abuse 04/26/2015  . Alcohol withdrawal (HCC) 01/20/2015  . Marijuana abuse 01/20/2015  . Abnormal CT scan, colon 01/08/2015  . Ovarian cyst 11/29/2014  . Anxiety 11/29/2014  . History of colitis 11/29/2014  . ETOH abuse 11/20/2014  . RUQ pain 08/27/2014  . Abdominal pain 08/07/2014  . Dysphagia, idiopathic 03/05/2014  . Thyroid nodule 01/11/2014  . RLQ abdominal pain 01/11/2014  . Vaginal discharge 01/11/2014  . Trichomonal vaginitis 01/11/2014  . Weight gain 01/11/2014  . Irregular periods 01/11/2014  . SVT (supraventricular tachycardia) (HCC) 09/07/2012  . Obesity (BMI 30-39.9) 09/07/2012  . EBV infection 08/11/2012  . Orthostatic hypotension 08/08/2012  . GERD (gastroesophageal reflux disease) 07/21/2012  . Diarrhea 07/21/2012  . Upper abdominal pain 10/28/2011  . Elevated LFTs- negative HIDA 10/28/2011  . Fatty liver 10/28/2011    Past Surgical History:  Procedure Laterality Date  . BIOPSY N/A 02/05/2015   Procedure: BIOPSY;  Surgeon: West Bali, MD;  Location: [facility];  Service: Endoscopy;  Laterality: N/A;  . COLONOSCOPY WITH PROPOFOL N/A 02/05/2015   FAO:ZHYQM HH/mild diverticulosis  . ESOPHAGOGASTRODUODENOSCOPY  11/06/2011   [name]: MILD Esophagitis/PATENT ESOPHAGEAL Stricture/  Moderate gastritis. Bx no.hpylori or celiac, +gastritis  . ESOPHAGOGASTRODUODENOSCOPY (EGD) WITH PROPOFOL N/A 03/20/2014   [name]: 1. Mild esophagitis & distal esophagela stricture. 2. small hiatal hernia 3. moderate non-erosive gastritis and mild duodenits  .  SAVORY DILATION N/A 03/20/2014   Procedure: SAVORY DILATION;  Surgeon: West Bali, MD;  Location: [facility];  Service: Endoscopy;  Laterality: N/A;  dilated with # 12.8, 14,15,16    OB History    Gravida Para Term Preterm AB Living   1       1     SAB TAB Ectopic Multiple Live Births     1             Home Medications    Prior to Admission medications   Medication Sig Start Date End Date Taking?  Authorizing Provider  aspirin-acetaminophen-caffeine (EXCEDRIN MIGRAINE) [phone] MG tablet Take 1 tablet by mouth every 6 (six) hours as needed for headache.   Yes Historical Provider, MD  metoprolol tartrate (LOPRESSOR) 25 MG tablet Take 12.5 mg by mouth as needed (for palpitations). Reported on 08/28/2015 08/13/12  Yes Renae Fickle, MD  Multiple Vitamins-Minerals (MULTIVITAMINS THER. W/MINERALS) TABS tablet Take 1 tablet by mouth daily.   Yes Historical Provider, MD  LORazepam (ATIVAN) 1 MG tablet Take 1 tablet (1 mg total) by mouth 3 (three) times daily as needed for anxiety. 10/27/15   Mancel Bale, MD    Family History Family History  Problem Relation Age of Onset  . Stomach cancer Paternal Grandfather     colon cancer  . Cancer Paternal Grandfather     throat and esophagus  . Breast cancer Maternal Grandmother   . Cancer Maternal Grandmother     skin  . Anxiety disorder Maternal Grandmother   . Hypertension Mother   . Other Mother     fatty liver  . Hyperlipidemia Mother   . Liver disease Mother     fatty liver, does not drink.   . Other Father     varicose veins; stomach issues; hernia  . Hypertension Father   . Hyperlipidemia Father   . Cancer Father     prostate  . Arthritis Father     rheumatoid  . Thyroid disease Sister   . Other Brother     hernia  . Diabetes Maternal Grandfather   . Other Paternal Grandmother     hernia  . COPD Paternal Grandmother     Social History Social History  Substance Use Topics  . Smoking status: Never Smoker  . Smokeless tobacco: Never Used  . Alcohol use 1.2 oz/week    2 Glasses of wine per week     Comment: not now     Allergies   Penicillins and Lidocaine   Review of Systems Review of Systems  Constitutional: Negative for chills and fever.       +generalized shaking  Respiratory: Positive for chest tightness and shortness of breath.   Cardiovascular: Negative for palpitations.  Neurological: Positive for  light-headedness. Negative for syncope.  Psychiatric/Behavioral: The patient is nervous/anxious.   All other systems reviewed and are negative.    Physical Exam Updated Vital Signs BP 114/73   Pulse 110   Temp 98 F (36.7 C) (Oral)   Resp 19   Ht 5' 4.5" (1.638 m)   Wt 160 lb (72.6 kg)   LMP 10/26/2015   SpO2 96%   BMI 27.04 kg/m   Physical Exam  Constitutional: She is oriented to person, place, and time. She appears well-developed and well-nourished. No distress.  HENT:  Head: Normocephalic and atraumatic.  Eyes: Conjunctivae are normal.  Cardiovascular: Tachycardia present.   Pulmonary/Chest: Effort normal.  Neurological: She is alert and oriented to person, place, and  time.  Skin: Skin is warm and dry.  Psychiatric: Her mood appears anxious.  Nursing note and vitals reviewed.    ED Treatments / Results  DIAGNOSTIC STUDIES:  Oxygen Saturation is 100% on RA, normal by my interpretation.    COORDINATION OF CARE:  10:00 AM Discussed treatment plan with pt at bedside and pt agreed to plan.  Labs (all labs ordered are listed, but only abnormal results are displayed) Labs Reviewed  CBC - Abnormal; Notable for the following:       Result Value   RBC 3.60 (*)    MCV 105.3 (*)    MCH 40.1 (*)    MCHC 38.0 (*)    All other components within normal limits  BASIC METABOLIC PANEL  I-STAT TROPOININ, ED    EKG  EKG Interpretation  Date/Time:  Sunday October 27 2015 08:52:14 EDT Ventricular Rate:  110 PR Interval:    QRS Duration: 83 QT Interval:  349 QTC Calculation: 473 R Axis:   40 Text Interpretation:  Sinus tachycardia since last tracing no significant change Confirmed by Effie Shy  MD, Ebonee Stober ([id number]) on 10/27/2015 9:46:42 AM       Radiology No results found.  Procedures Procedures (including critical care time)  Medications Ordered in ED Medications  LORazepam (ATIVAN) injection 1 mg (1 mg Intravenous Given 10/27/15 1009)     Initial Impression /  Assessment and Plan / ED Course  I have reviewed the triage vital signs and the nursing notes.  Pertinent labs & imaging results that were available during my care of the patient were reviewed by me and considered in my medical decision making (see chart for details).  Clinical Course    Medications  LORazepam (ATIVAN) injection 1 mg (1 mg Intravenous Given 10/27/15 1009)    No data found.  BP 114/73   Pulse 110   Temp 98 F (36.7 C) (Oral)   Resp 19   Ht 5' 4.5" (1.638 m)   Wt 160 lb (72.6 kg)   LMP 10/26/2015   SpO2 96%   BMI 27.04 kg/m   At d/c Reevaluation with update and discussion. After initial assessment and treatment, an updated evaluation reveals she is more comfortable. HR improved. Delyla Sandeen L   Final Clinical Impressions(s) / ED Diagnoses   Final diagnoses:  Anxiety    Nursing Notes Reviewed/ Care Coordinated Applicable Imaging Reviewed Interpretation of Laboratory Data incorporated into ED treatment    New Prescriptions Discharge Medication List as of 10/27/2015 12:06 PM    START taking these medications   Details  LORazepam (ATIVAN) 1 MG tablet Take 1 tablet (1 mg total) by mouth 3 (three) times daily as needed for anxiety., Starting Sun 10/27/2015, Print       I personally performed the services described in this documentation, which was scribed in my presence. The recorded information has been reviewed and is accurate.     Mancel Bale, MD 10/29/15 [phone]

## 2015-11-20 ENCOUNTER — Emergency Department (HOSPITAL_COMMUNITY)
Admission: EM | Admit: 2015-11-20 | Discharge: 2015-11-20 | Disposition: A | Discharge status: Home / Self Care | Payer: Self-pay | Attending: Emergency Medicine | Admitting: Emergency Medicine

## 2015-11-20 ENCOUNTER — Encounter (HOSPITAL_COMMUNITY): Payer: Self-pay | Admitting: Emergency Medicine

## 2015-11-20 ENCOUNTER — Emergency Department (HOSPITAL_COMMUNITY): Payer: Self-pay

## 2015-11-20 DIAGNOSIS — Y939 Activity, unspecified: Secondary | ICD-10-CM | POA: Insufficient documentation

## 2015-11-20 DIAGNOSIS — I10 Essential (primary) hypertension: Secondary | ICD-10-CM | POA: Insufficient documentation

## 2015-11-20 DIAGNOSIS — R059 Cough, unspecified: Secondary | ICD-10-CM

## 2015-11-20 DIAGNOSIS — J45909 Unspecified asthma, uncomplicated: Secondary | ICD-10-CM | POA: Insufficient documentation

## 2015-11-20 DIAGNOSIS — R0600 Dyspnea, unspecified: Secondary | ICD-10-CM

## 2015-11-20 DIAGNOSIS — Z79899 Other long term (current) drug therapy: Secondary | ICD-10-CM | POA: Insufficient documentation

## 2015-11-20 DIAGNOSIS — R05 Cough: Secondary | ICD-10-CM

## 2015-11-20 DIAGNOSIS — Z7982 Long term (current) use of aspirin: Secondary | ICD-10-CM | POA: Insufficient documentation

## 2015-11-20 DIAGNOSIS — Y92009 Unspecified place in unspecified non-institutional (private) residence as the place of occurrence of the external cause: Secondary | ICD-10-CM

## 2015-11-20 DIAGNOSIS — E039 Hypothyroidism, unspecified: Secondary | ICD-10-CM | POA: Insufficient documentation

## 2015-11-20 DIAGNOSIS — W19XXXA Unspecified fall, initial encounter: Secondary | ICD-10-CM

## 2015-11-20 DIAGNOSIS — Y999 Unspecified external cause status: Secondary | ICD-10-CM | POA: Insufficient documentation

## 2015-11-20 DIAGNOSIS — S20212A Contusion of left front wall of thorax, initial encounter: Secondary | ICD-10-CM | POA: Insufficient documentation

## 2015-11-20 DIAGNOSIS — W109XXA Fall (on) (from) unspecified stairs and steps, initial encounter: Secondary | ICD-10-CM | POA: Insufficient documentation

## 2015-11-20 DIAGNOSIS — Y929 Unspecified place or not applicable: Secondary | ICD-10-CM | POA: Insufficient documentation

## 2015-11-20 LAB — POC URINE PREG, ED: PREG TEST UR: NEGATIVE

## 2015-11-20 MED ORDER — NAPROXEN 500 MG PO TABS: ORAL_TABLET | ORAL | 0 refills | Status: DC

## 2015-11-20 MED ORDER — TRAMADOL HCL 50 MG PO TABS: 100.0000 mg | ORAL_TABLET | Freq: Four times a day (QID) | ORAL | 0 refills | Status: DC | PRN

## 2015-11-20 MED ORDER — SODIUM CHLORIDE 0.9 % IV SOLN
INTRAVENOUS | Status: DC
  Administered 2015-11-20: 07:00:00 via INTRAVENOUS

## 2015-11-20 MED ORDER — DM-GUAIFENESIN ER 30-600 MG PO TB12
1.0000 | ORAL_TABLET | Freq: Two times a day (BID) | ORAL | Status: DC
  Administered 2015-11-20: 1 via ORAL
  Filled 2015-11-20: qty 1

## 2015-11-20 MED ORDER — KETOROLAC TROMETHAMINE 30 MG/ML IJ SOLN
30.0000 mg | Freq: Once | INTRAMUSCULAR | Status: AC
  Administered 2015-11-20: 30 mg via INTRAVENOUS
  Filled 2015-11-20: qty 1

## 2015-11-20 MED ORDER — CYCLOBENZAPRINE HCL 10 MG PO TABS: 10.0000 mg | ORAL_TABLET | Freq: Three times a day (TID) | ORAL | 0 refills | Status: DC | PRN

## 2015-11-20 MED ORDER — DIAZEPAM 5 MG/ML IJ SOLN
5.0000 mg | Freq: Once | INTRAMUSCULAR | Status: AC
  Administered 2015-11-20: 5 mg via INTRAVENOUS
  Filled 2015-11-20: qty 2

## 2015-11-20 NOTE — Discharge Instructions (Signed)
Take the medication as prescribed with acetaminophen 1000 mg 4 times a day. Use ice packs to the painful area. You can take Mucinex DM over-the-counter for cough. Recheck if you get a fever, you feel like your heart is racing despite taking your metoprolol, or if you start coughing up mucus.

## 2015-11-20 NOTE — ED Provider Notes (Signed)
[facility] Provider Note   CSN: [id number] Arrival date & time: 11/20/15  0551  Time seen 06:00 AM   History   Chief Complaint Chief Complaint  Patient presents with  . Shortness of Breath    HPI Kara Stewart is a 40 y.o. female.  HPI  Patient states she was fine yesterday. She states about 4 AM she woke up feeling short of breath and having coughing spells. She states while she was coughing she felt like she couldn't breathe. She describes the cough is dry. She states she feels dizzy when she coughs. She states she went downstairs and took her metoprolol that she takes for SVT and Ativan she takes for anxiety. As she was coming back up the steps she started seeing spots and she fell forward and states she is now having pain in her left anterior chest. She thinks she hit her chest on the steps when she fell. She states she feels a grabbing clicking sensation. She reports she has had a rib fracture in that same area before last year. She denies any fever or chills.  Past Medical History:  Diagnosis Date  . Acid reflux   . Alcohol abuse 07/21/2012  . 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 Anxiety   . Asthma   . BV (bacterial vaginosis) 04/26/2015  . Colitis   . Dyspepsia 2004   Dx w/ PUD but no EGD, [place], [place]   . Hiatal hernia   . History of alcohol abuse 04/26/2015  . History of colitis 11/29/2014  . Hypertension   . Hypertriglyceridemia   . Hypothyroid   . Iritis    frequent  . Irregular periods 01/11/2014  . Ovarian cyst   . Panic attacks   . RLQ abdominal pain 01/11/2014  . SVT (supraventricular tachycardia) (HCC)   . Thyroid nodule   . Trichomonal vaginitis 01/11/2014  . Vaginal discharge 01/11/2014  . Weight gain 01/11/2014    Patient Active Problem List   Diagnosis Date Noted  . BV (bacterial vaginosis) 04/26/2015  . History of alcohol abuse 04/26/2015  . Alcohol withdrawal (HCC) 01/20/2015  . Marijuana abuse 01/20/2015  . Abnormal CT scan, colon  01/08/2015  . Ovarian cyst 11/29/2014  . Anxiety 11/29/2014  . History of colitis 11/29/2014  . ETOH abuse 11/20/2014  . RUQ pain 08/27/2014  . Abdominal pain 08/07/2014  . Dysphagia, idiopathic 03/05/2014  . Thyroid nodule 01/11/2014  . RLQ abdominal pain 01/11/2014  . Vaginal discharge 01/11/2014  . Trichomonal vaginitis 01/11/2014  . Weight gain 01/11/2014  . Irregular periods 01/11/2014  . SVT (supraventricular tachycardia) (HCC) 09/07/2012  . Obesity (BMI 30-39.9) 09/07/2012  . EBV infection 08/11/2012  . Orthostatic hypotension 08/08/2012  . GERD (gastroesophageal reflux disease) 07/21/2012  . Diarrhea 07/21/2012  . Upper abdominal pain 10/28/2011  . Elevated LFTs- negative HIDA 10/28/2011  . Fatty liver 10/28/2011    Past Surgical History:  Procedure Laterality Date  . BIOPSY N/A 02/05/2015   Procedure: BIOPSY;  Surgeon: West Bali, MD;  Location: [facility];  Service: Endoscopy;  Laterality: N/A;  . COLONOSCOPY WITH PROPOFOL N/A 02/05/2015   NWG:NFAOZ HH/mild diverticulosis  . ESOPHAGOGASTRODUODENOSCOPY  11/06/2011   [name]: MILD Esophagitis/PATENT ESOPHAGEAL Stricture/  Moderate gastritis. Bx no.hpylori or celiac, +gastritis  . ESOPHAGOGASTRODUODENOSCOPY (EGD) WITH PROPOFOL N/A 03/20/2014   [name]: 1. Mild esophagitis & distal esophagela stricture. 2. small hiatal hernia 3. moderate non-erosive gastritis and mild duodenits  . SAVORY DILATION N/A 03/20/2014   Procedure: SAVORY DILATION;  Surgeon:  West BaliSandi L Fields, MD;  Location: [facility];  Service: Endoscopy;  Laterality: N/A;  dilated with # 12.8, 14,15,16    OB History    Gravida Para Term Preterm AB Living   1       1     SAB TAB Ectopic Multiple Live Births     1             Home Medications    Prior to Admission medications   Medication Sig Start Date End Date Taking? Authorizing Provider  aspirin-acetaminophen-caffeine (EXCEDRIN MIGRAINE) [id number] MG tablet Take 1 tablet by mouth every 6 (six) hours as needed for  headache.    Historical Provider, MD  cyclobenzaprine (FLEXERIL) 10 MG tablet Take 1 tablet (10 mg total) by mouth 3 (three) times daily as needed for muscle spasms. 11/20/15   Devoria AlbeIva Starsha Morning, MD  LORazepam (ATIVAN) 1 MG tablet Take 1 tablet (1 mg total) by mouth 3 (three) times daily as needed for anxiety. 10/27/15   Mancel BaleElliott Wentz, MD  metoprolol tartrate (LOPRESSOR) 25 MG tablet Take 12.5 mg by mouth as needed (for palpitations). Reported on 08/28/2015 08/13/12   Renae FickleMackenzie Short, MD  Multiple Vitamins-Minerals (MULTIVITAMINS THER. W/MINERALS) TABS tablet Take 1 tablet by mouth daily.    Historical Provider, MD  naproxen (NAPROSYN) 500 MG tablet Take 1 po BID with food prn pain 11/20/15   Devoria AlbeIva Matis Monnier, MD  traMADol (ULTRAM) 50 MG tablet Take 2 tablets (100 mg total) by mouth every 6 (six) hours as needed. 11/20/15   Devoria AlbeIva Waseem Suess, MD    Family History Family History  Problem Relation Age of Onset  . Stomach cancer Paternal Grandfather     colon cancer  . Cancer Paternal Grandfather     throat and esophagus  . Breast cancer Maternal Grandmother   . Cancer Maternal Grandmother     skin  . Anxiety disorder Maternal Grandmother   . Hypertension Mother   . Other Mother     fatty liver  . Hyperlipidemia Mother   . Liver disease Mother     fatty liver, does not drink.   . Other Father     varicose veins; stomach issues; hernia  . Hypertension Father   . Hyperlipidemia Father   . Cancer Father     prostate  . Arthritis Father     rheumatoid  . Thyroid disease Sister   . Other Brother     hernia  . Diabetes Maternal Grandfather   . Other Paternal Grandmother     hernia  . COPD Paternal Grandmother     Social History Social History  Substance Use Topics  . Smoking status: Never Smoker  . Smokeless tobacco: Never Used  . 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    Physical Exam Updated Vital Signs BP 123/89   Pulse 80   Temp 98.2 F (36.8 C)   Resp 18   Ht 5\' 4"  (1.626 m)   Wt 160 lb (72.6 kg)   LMP 10/26/2015 Comment: Negative U-preg in ED 11/20/2015   SpO2 100%   BMI 27.46 kg/m   Physical Exam  Constitutional: She is oriented to person, place, and time. She appears well-developed and well-nourished.  Non-toxic appearance. She does not appear ill.  HENT:  Head: Normocephalic and atraumatic.  Right Ear: External ear normal.  Left Ear: External ear normal.  Nose: Nose normal. No mucosal edema or rhinorrhea.  Mouth/Throat: Oropharynx is clear and moist and mucous membranes are normal. No dental abscesses or uvula swelling.  Eyes: Conjunctivae and EOM are normal. Pupils are equal, round, and reactive to light.  Neck: Normal range of motion and full passive range of motion without pain. Neck supple.  Cardiovascular: Normal rate, regular rhythm and normal heart sounds.  Exam reveals no gallop and no friction rub.   No murmur heard. Pulmonary/Chest: Effort normal and breath sounds normal. No respiratory distress. She has no wheezes. She has no rhonchi. She has no rales. She exhibits no tenderness and no crepitus.    Patient is clutching her left anterior chest and when she coughs she holds it tighter. Patient is tender in her left anterior chest without crepitance felt.  Abdominal: Soft. Normal appearance and bowel sounds are normal. She exhibits no distension. There is no tenderness. There is no rebound and no guarding.  Musculoskeletal: Normal range of motion. She exhibits no edema or tenderness.  Moves all extremities well.   Neurological: She is alert and oriented to person, place, and time. She has normal strength. No cranial nerve deficit.  Skin: Skin is  warm, dry and intact. No rash noted. No erythema. No pallor.  Psychiatric: Her speech is normal and behavior is normal. Her mood appears anxious.  Nursing note and vitals reviewed.    ED Treatments / Results  Labs (all labs ordered are listed, but only abnormal results are displayed) Results for orders placed or performed during the hospital encounter of 11/20/15  POC Urine Pregnancy, ED (do NOT order at [facility])  Result Value Ref Range   Preg Test, Ur NEGATIVE NEGATIVE   Dg Chest 2 View  Result Date: 10/27/2015 CLINICAL DATA:  Left-sided chest pain EXAM: CHEST  2 VIEW COMPARISON:  September 28, 2015 FINDINGS: The heart size and mediastinal contours are within normal limits. Both lungs are clear. The visualized skeletal structures are unremarkable. IMPRESSION: No active cardiopulmonary disease. Electronically Signed   By: Gerome Samavid  Williams III M.D   On: 10/27/2015 10:08    EKG  EKG Interpretation  Date/Time:  Wednesday November 20 2015 06:03:16 EDT Ventricular Rate:  110 PR Interval:    QRS Duration: 80 QT Interval:  344 QTC Calculation: 466 R Axis:   54 Text Interpretation:  Sinus tachycardia Otherwise within normal limits No significant change since last tracing 27 Oct 2015 Confirmed by Amair Shrout  MD-I, Tanush Drees ([id number]) on 11/20/2015 6:39:35 AM       Radiology Dg Ribs Unilateral W/chest Left  Result Date: 11/20/2015 CLINICAL DATA:  Pain following fall EXAM: LEFT RIBS AND CHEST - 3+ VIEW COMPARISON:  Chest radiograph October 27, 2015 FINDINGS: Frontal chest as well as oblique and cone-down lower rib images were obtained. Lungs are clear. Heart size and pulmonary vascularity normal. No adenopathy. There is no pneumothorax or pleural effusion evident. There are old healed fractures of the anterior left ninth and tenth ribs. No acute fracture evident. IMPRESSION: Old healed fractures of the anterior left ninth and tenth ribs with mild remodeling in these areas. Lungs clear. No pneumothorax. Electronically  Signed   By: Bretta BangWilliam  Woodruff III M.D.   On: 11/20/2015 07:12    Procedures Procedures (  including critical care time)  Medications Ordered in ED Medications  0.9 %  sodium chloride infusion ( Intravenous Stopped 11/20/15 0716)  dextromethorphan-guaiFENesin (MUCINEX DM) 30-600 MG per 12 hr tablet 1 tablet (1 tablet Oral Given 11/20/15 0714)  ketorolac (TORADOL) 30 MG/ML injection 30 mg (30 mg Intravenous Given 11/20/15 0633)  diazepam (VALIUM) injection 5 mg (5 mg Intravenous Given 11/20/15 0631)     Initial Impression / Assessment and Plan / ED Course  I have reviewed the triage vital signs and the nursing notes.  Pertinent labs & imaging results that were available during my care of the patient were reviewed by me and considered in my medical decision making (see chart for details).  Clinical Course   Patient had IV inserted she was given Toradol for pain and IV Valium also for pain and her anxiety. She was given Mucinex DM for cough. She is noted to have frequent coughing episodes in the ED.  Recheck at 7:30 AM patient's heart rate is 80. She seems a whole lot less stress. She states she still having pain however she drove herself to the ED. We discussed her x-ray results.  Review of the [place] shows patient got #20 hydrocodone/ibuprofen 7.5/200 from a dentist on June 2, she received #10 oxycodone 5/325 on May 20, and she was given various small amounts of clorazepoxine ranging from 5 tablets up to 30 tablets a total of 4 times and lorazepam twice.  Final Clinical Impressions(s) / ED Diagnoses   Final diagnoses:  Dyspnea  Contusion of rib on left side, initial encounter  Fall at home, initial encounter  Coughing    New Prescriptions New Prescriptions   CYCLOBENZAPRINE (FLEXERIL) 10 MG TABLET    Take 1 tablet (10 mg total) by mouth 3 (three) times daily as needed for muscle spasms.   NAPROXEN (NAPROSYN) 500 MG TABLET    Take 1 po BID with food prn pain    TRAMADOL (ULTRAM) 50 MG TABLET    Take 2 tablets (100 mg total) by mouth every 6 (six) hours as needed.    Plan discharge  Devoria Albe, MD, Concha Pyo, MD 11/20/15 [phone]

## 2015-11-20 NOTE — ED Triage Notes (Signed)
Pt c/o sob with dizziness that started this am. Due to dizziness pt fell on the left side and c/o rib pain.

## 2015-11-21 ENCOUNTER — Encounter (HOSPITAL_COMMUNITY)
Admission: EM | Disposition: A | Discharge status: Home / Self Care | Payer: Self-pay | Source: Home / Self Care | Attending: Emergency Medicine

## 2015-11-21 ENCOUNTER — Observation Stay (HOSPITAL_COMMUNITY)
Admission: EM | Admit: 2015-11-21 | Discharge: 2015-11-23 | Disposition: A | Discharge status: Home / Self Care | Payer: Self-pay | Attending: Internal Medicine | Admitting: Internal Medicine

## 2015-11-21 ENCOUNTER — Observation Stay (HOSPITAL_COMMUNITY): Payer: Self-pay | Admitting: Anesthesiology

## 2015-11-21 ENCOUNTER — Encounter (HOSPITAL_COMMUNITY): Payer: Self-pay

## 2015-11-21 DIAGNOSIS — I951 Orthostatic hypotension: Secondary | ICD-10-CM | POA: Insufficient documentation

## 2015-11-21 DIAGNOSIS — K449 Diaphragmatic hernia without obstruction or gangrene: Secondary | ICD-10-CM | POA: Insufficient documentation

## 2015-11-21 DIAGNOSIS — R05 Cough: Secondary | ICD-10-CM

## 2015-11-21 DIAGNOSIS — I471 Supraventricular tachycardia: Secondary | ICD-10-CM

## 2015-11-21 DIAGNOSIS — E039 Hypothyroidism, unspecified: Secondary | ICD-10-CM | POA: Insufficient documentation

## 2015-11-21 DIAGNOSIS — Z6828 Body mass index (BMI) 28.0-28.9, adult: Secondary | ICD-10-CM | POA: Insufficient documentation

## 2015-11-21 DIAGNOSIS — K219 Gastro-esophageal reflux disease without esophagitis: Secondary | ICD-10-CM | POA: Diagnosis present

## 2015-11-21 DIAGNOSIS — K760 Fatty (change of) liver, not elsewhere classified: Secondary | ICD-10-CM | POA: Insufficient documentation

## 2015-11-21 DIAGNOSIS — E041 Nontoxic single thyroid nodule: Secondary | ICD-10-CM | POA: Insufficient documentation

## 2015-11-21 DIAGNOSIS — R109 Unspecified abdominal pain: Secondary | ICD-10-CM | POA: Diagnosis present

## 2015-11-21 DIAGNOSIS — Z825 Family history of asthma and other chronic lower respiratory diseases: Secondary | ICD-10-CM | POA: Insufficient documentation

## 2015-11-21 DIAGNOSIS — F419 Anxiety disorder, unspecified: Secondary | ICD-10-CM

## 2015-11-21 DIAGNOSIS — Z818 Family history of other mental and behavioral disorders: Secondary | ICD-10-CM | POA: Insufficient documentation

## 2015-11-21 DIAGNOSIS — E781 Pure hyperglyceridemia: Secondary | ICD-10-CM | POA: Insufficient documentation

## 2015-11-21 DIAGNOSIS — Z8249 Family history of ischemic heart disease and other diseases of the circulatory system: Secondary | ICD-10-CM | POA: Insufficient documentation

## 2015-11-21 DIAGNOSIS — Z79899 Other long term (current) drug therapy: Secondary | ICD-10-CM | POA: Insufficient documentation

## 2015-11-21 DIAGNOSIS — N926 Irregular menstruation, unspecified: Secondary | ICD-10-CM | POA: Insufficient documentation

## 2015-11-21 DIAGNOSIS — Z803 Family history of malignant neoplasm of breast: Secondary | ICD-10-CM | POA: Insufficient documentation

## 2015-11-21 DIAGNOSIS — R059 Cough, unspecified: Secondary | ICD-10-CM

## 2015-11-21 DIAGNOSIS — F1010 Alcohol abuse, uncomplicated: Secondary | ICD-10-CM

## 2015-11-21 DIAGNOSIS — R1314 Dysphagia, pharyngoesophageal phase: Secondary | ICD-10-CM

## 2015-11-21 DIAGNOSIS — Z8349 Family history of other endocrine, nutritional and metabolic diseases: Secondary | ICD-10-CM | POA: Insufficient documentation

## 2015-11-21 DIAGNOSIS — K209 Esophagitis, unspecified: Secondary | ICD-10-CM

## 2015-11-21 DIAGNOSIS — Z800 Family history of malignant neoplasm of digestive organs: Secondary | ICD-10-CM | POA: Insufficient documentation

## 2015-11-21 DIAGNOSIS — K7010 Alcoholic hepatitis without ascites: Secondary | ICD-10-CM

## 2015-11-21 DIAGNOSIS — Z7982 Long term (current) use of aspirin: Secondary | ICD-10-CM | POA: Insufficient documentation

## 2015-11-21 DIAGNOSIS — D7589 Other specified diseases of blood and blood-forming organs: Secondary | ICD-10-CM | POA: Insufficient documentation

## 2015-11-21 DIAGNOSIS — Z8261 Family history of arthritis: Secondary | ICD-10-CM | POA: Insufficient documentation

## 2015-11-21 DIAGNOSIS — R1310 Dysphagia, unspecified: Secondary | ICD-10-CM | POA: Insufficient documentation

## 2015-11-21 DIAGNOSIS — F410 Panic disorder [episodic paroxysmal anxiety] without agoraphobia: Secondary | ICD-10-CM | POA: Insufficient documentation

## 2015-11-21 DIAGNOSIS — I10 Essential (primary) hypertension: Secondary | ICD-10-CM | POA: Insufficient documentation

## 2015-11-21 DIAGNOSIS — K920 Hematemesis: Secondary | ICD-10-CM

## 2015-11-21 DIAGNOSIS — R000 Tachycardia, unspecified: Secondary | ICD-10-CM

## 2015-11-21 DIAGNOSIS — Z8042 Family history of malignant neoplasm of prostate: Secondary | ICD-10-CM | POA: Insufficient documentation

## 2015-11-21 DIAGNOSIS — K210 Gastro-esophageal reflux disease with esophagitis: Secondary | ICD-10-CM | POA: Insufficient documentation

## 2015-11-21 DIAGNOSIS — K589 Irritable bowel syndrome without diarrhea: Secondary | ICD-10-CM | POA: Insufficient documentation

## 2015-11-21 DIAGNOSIS — E669 Obesity, unspecified: Secondary | ICD-10-CM

## 2015-11-21 DIAGNOSIS — J45909 Unspecified asthma, uncomplicated: Secondary | ICD-10-CM | POA: Insufficient documentation

## 2015-11-21 DIAGNOSIS — R1319 Other dysphagia: Secondary | ICD-10-CM | POA: Insufficient documentation

## 2015-11-21 DIAGNOSIS — K226 Gastro-esophageal laceration-hemorrhage syndrome: Principal | ICD-10-CM | POA: Insufficient documentation

## 2015-11-21 HISTORY — PX: ESOPHAGOGASTRODUODENOSCOPY (EGD) WITH PROPOFOL: SHX5813

## 2015-11-21 HISTORY — PX: ESOPHAGEAL DILATION: SHX303

## 2015-11-21 LAB — COMPREHENSIVE METABOLIC PANEL
ALK PHOS: 84 U/L (ref 38–126)
ALT: 114 U/L — ABNORMAL HIGH (ref 14–54)
AST: 196 U/L — ABNORMAL HIGH (ref 15–41)
Albumin: 3.9 g/dL (ref 3.5–5.0)
Anion gap: 10 (ref 5–15)
BILIRUBIN TOTAL: 1.1 mg/dL (ref 0.3–1.2)
BUN: 8 mg/dL (ref 6–20)
CALCIUM: 9.4 mg/dL (ref 8.9–10.3)
CO2: 22 mmol/L (ref 22–32)
Chloride: 108 mmol/L (ref 101–111)
Creatinine, Ser: 0.68 mg/dL (ref 0.44–1.00)
GFR calc non Af Amer: >60 mL/min (ref >60)
Glucose, Bld: 83 mg/dL (ref 65–99)
Potassium: 3.4 mmol/L — ABNORMAL LOW (ref 3.5–5.1)
Sodium: 140 mmol/L (ref 135–145)
TOTAL PROTEIN: 7.1 g/dL (ref 6.5–8.1)

## 2015-11-21 LAB — TYPE AND SCREEN
ABO/RH(D): A POS
ANTIBODY SCREEN: NEGATIVE

## 2015-11-21 LAB — TSH: TSH: 1.675 u[IU]/mL (ref 0.350–4.500)

## 2015-11-21 LAB — CBC
HCT: 38.1 % (ref 36.0–46.0)
HCT: 35.2 % — ABNORMAL LOW (ref 36.0–46.0)
HEMATOCRIT: 36.3 % (ref 36.0–46.0)
HEMOGLOBIN: 13.5 g/dL (ref 12.0–15.0)
Hemoglobin: 12.6 g/dL (ref 12.0–15.0)
Hemoglobin: 12.2 g/dL (ref 12.0–15.0)
MCH: 38.8 pg — ABNORMAL HIGH (ref 26.0–34.0)
MCH: 38.5 pg — AB (ref 26.0–34.0)
MCH: 38.5 pg — ABNORMAL HIGH (ref 26.0–34.0)
MCHC: 35.4 g/dL (ref 30.0–36.0)
MCHC: 34.7 g/dL (ref 30.0–36.0)
MCHC: 34.7 g/dL (ref 30.0–36.0)
MCV: 109.5 fL — ABNORMAL HIGH (ref 78.0–100.0)
MCV: 111 fL — AB (ref 78.0–100.0)
MCV: 111 fL — AB (ref 78.0–100.0)
PLATELETS: 206 10*3/uL (ref 150–400)
PLATELETS: 183 10*3/uL (ref 150–400)
PLATELETS: 182 10*3/uL (ref 150–400)
RBC: 3.48 MIL/uL — AB (ref 3.87–5.11)
RBC: 3.27 MIL/uL — ABNORMAL LOW (ref 3.87–5.11)
RBC: 3.17 MIL/uL — ABNORMAL LOW (ref 3.87–5.11)
RDW: 12.6 % (ref 11.5–15.5)
RDW: 12.6 % (ref 11.5–15.5)
RDW: 12.5 % (ref 11.5–15.5)
WBC: 4.5 10*3/uL (ref 4.0–10.5)
WBC: 5.8 10*3/uL (ref 4.0–10.5)
WBC: 5.1 10*3/uL (ref 4.0–10.5)

## 2015-11-21 LAB — ETHANOL: Alcohol, Ethyl (B): <5 mg/dL (ref <5)

## 2015-11-21 LAB — POC OCCULT BLOOD, ED: FECAL OCCULT BLD: NEGATIVE

## 2015-11-21 LAB — LIPASE, BLOOD: LIPASE: 58 U/L — AB (ref 11–51)

## 2015-11-21 SURGERY — ESOPHAGOGASTRODUODENOSCOPY (EGD) WITH PROPOFOL
Anesthesia: Monitor Anesthesia Care

## 2015-11-21 MED ORDER — MIDAZOLAM HCL 2 MG/2ML IJ SOLN
INTRAMUSCULAR | Status: AC
  Filled 2015-11-21: qty 2

## 2015-11-21 MED ORDER — FAMOTIDINE IN NACL 20-0.9 MG/50ML-% IV SOLN
20.0000 mg | Freq: Once | INTRAVENOUS | Status: AC
  Administered 2015-11-21: 20 mg via INTRAVENOUS
  Filled 2015-11-21: qty 50

## 2015-11-21 MED ORDER — VITAMIN B-1 100 MG PO TABS
100.0000 mg | ORAL_TABLET | Freq: Every day | ORAL | Status: DC
  Administered 2015-11-21 – 2015-11-23 (×3): 100 mg via ORAL
  Filled 2015-11-21 (×3): qty 1

## 2015-11-21 MED ORDER — THIAMINE HCL 100 MG/ML IJ SOLN: 100.0000 mg | Freq: Every day | INTRAMUSCULAR | Status: DC

## 2015-11-21 MED ORDER — SODIUM CHLORIDE 0.9% FLUSH
3.0000 mL | Freq: Two times a day (BID) | INTRAVENOUS | Status: DC
  Administered 2015-11-21 – 2015-11-23 (×4): 3 mL via INTRAVENOUS

## 2015-11-21 MED ORDER — LORAZEPAM 1 MG PO TABS
1.0000 mg | ORAL_TABLET | Freq: Three times a day (TID) | ORAL | Status: DC | PRN
  Administered 2015-11-21 – 2015-11-22 (×2): 1 mg via ORAL
  Filled 2015-11-21: qty 1

## 2015-11-21 MED ORDER — FOLIC ACID 1 MG PO TABS
1.0000 mg | ORAL_TABLET | Freq: Every day | ORAL | Status: DC
  Administered 2015-11-21 – 2015-11-23 (×3): 1 mg via ORAL
  Filled 2015-11-21 (×3): qty 1

## 2015-11-21 MED ORDER — MIDAZOLAM HCL 2 MG/2ML IJ SOLN
1.0000 mg | INTRAMUSCULAR | Status: DC | PRN
  Administered 2015-11-21: 2 mg via INTRAVENOUS

## 2015-11-21 MED ORDER — LACTATED RINGERS IV SOLN
INTRAVENOUS | Status: DC
  Administered 2015-11-21: 13:00:00 via INTRAVENOUS

## 2015-11-21 MED ORDER — LORAZEPAM 2 MG/ML IJ SOLN
1.0000 mg | Freq: Four times a day (QID) | INTRAMUSCULAR | Status: DC | PRN
  Administered 2015-11-21 – 2015-11-22 (×2): 1 mg via INTRAVENOUS
  Filled 2015-11-21 (×2): qty 1

## 2015-11-21 MED ORDER — HYDROMORPHONE HCL 1 MG/ML IJ SOLN
0.5000 mg | Freq: Once | INTRAMUSCULAR | Status: AC
  Administered 2015-11-21: 0.5 mg via INTRAVENOUS
  Filled 2015-11-21: qty 1

## 2015-11-21 MED ORDER — FENTANYL CITRATE (PF) 100 MCG/2ML IJ SOLN
INTRAMUSCULAR | Status: AC
  Filled 2015-11-21: qty 2

## 2015-11-21 MED ORDER — FAMOTIDINE IN NACL 20-0.9 MG/50ML-% IV SOLN: 20.0000 mg | Freq: Two times a day (BID) | INTRAVENOUS | Status: DC

## 2015-11-21 MED ORDER — HYDROMORPHONE HCL 1 MG/ML IJ SOLN
INTRAMUSCULAR | Status: AC
  Filled 2015-11-21: qty 1

## 2015-11-21 MED ORDER — SODIUM CHLORIDE 0.9 % IV SOLN: INTRAVENOUS | Status: DC

## 2015-11-21 MED ORDER — ADULT MULTIVITAMIN W/MINERALS CH
1.0000 | ORAL_TABLET | Freq: Every day | ORAL | Status: DC
  Administered 2015-11-21 – 2015-11-23 (×3): 1 via ORAL
  Filled 2015-11-21 (×3): qty 1

## 2015-11-21 MED ORDER — PANTOPRAZOLE SODIUM 40 MG PO TBEC
40.0000 mg | DELAYED_RELEASE_TABLET | Freq: Two times a day (BID) | ORAL | Status: DC
  Administered 2015-11-21 – 2015-11-22 (×3): 40 mg via ORAL
  Filled 2015-11-21 (×4): qty 1

## 2015-11-21 MED ORDER — PROPOFOL 10 MG/ML IV BOLUS
INTRAVENOUS | Status: AC
  Filled 2015-11-21: qty 20

## 2015-11-21 MED ORDER — FAMOTIDINE IN NACL 20-0.9 MG/50ML-% IV SOLN
20.0000 mg | Freq: Two times a day (BID) | INTRAVENOUS | Status: DC
  Administered 2015-11-21 – 2015-11-22 (×3): 20 mg via INTRAVENOUS
  Filled 2015-11-21 (×3): qty 50

## 2015-11-21 MED ORDER — ONDANSETRON HCL 4 MG/2ML IJ SOLN
INTRAMUSCULAR | Status: DC | PRN
  Administered 2015-11-21: 4 mg via INTRAVENOUS

## 2015-11-21 MED ORDER — PROPOFOL 500 MG/50ML IV EMUL
INTRAVENOUS | Status: DC | PRN
  Administered 2015-11-21: 150 ug/kg/min via INTRAVENOUS

## 2015-11-21 MED ORDER — METOPROLOL TARTRATE 5 MG/5ML IV SOLN
5.0000 mg | Freq: Once | INTRAVENOUS | Status: AC
  Administered 2015-11-21: 5 mg via INTRAVENOUS
  Filled 2015-11-21: qty 5

## 2015-11-21 MED ORDER — ONDANSETRON HCL 4 MG/2ML IJ SOLN
INTRAMUSCULAR | Status: AC
  Filled 2015-11-21: qty 2

## 2015-11-21 MED ORDER — LORAZEPAM 1 MG PO TABS
1.0000 mg | ORAL_TABLET | Freq: Four times a day (QID) | ORAL | Status: DC | PRN
  Administered 2015-11-23: 1 mg via ORAL
  Filled 2015-11-21 (×2): qty 1

## 2015-11-21 MED ORDER — DEXTROSE-NACL 5-0.9 % IV SOLN
INTRAVENOUS | Status: DC
  Administered 2015-11-21 – 2015-11-22 (×3): via INTRAVENOUS

## 2015-11-21 MED ORDER — HYDROMORPHONE HCL 1 MG/ML IJ SOLN
0.2500 mg | INTRAMUSCULAR | Status: DC | PRN
  Administered 2015-11-21 (×2): 0.25 mg via INTRAVENOUS

## 2015-11-21 MED ORDER — FENTANYL CITRATE (PF) 100 MCG/2ML IJ SOLN
25.0000 ug | INTRAMUSCULAR | Status: DC | PRN
  Administered 2015-11-21: 25 ug via INTRAVENOUS

## 2015-11-21 MED ORDER — METOPROLOL TARTRATE 25 MG PO TABS: 12.5000 mg | ORAL_TABLET | ORAL | Status: DC | PRN

## 2015-11-21 MED ORDER — MIDAZOLAM HCL 5 MG/5ML IJ SOLN
INTRAMUSCULAR | Status: DC | PRN
  Administered 2015-11-21: 2 mg via INTRAVENOUS
  Administered 2015-11-21 (×2): 1 mg via INTRAVENOUS

## 2015-11-21 MED ORDER — LACTATED RINGERS IV SOLN: INTRAVENOUS | Status: DC | PRN

## 2015-11-21 NOTE — ED Notes (Signed)
Report given to Schering-PloughCrystal, RN for room 338. Endo staff made aware of bed assignment and pt to be taken to 338 after prodcure completion.

## 2015-11-21 NOTE — Anesthesia Postprocedure Evaluation (Signed)
Anesthesia Post Note  Patient: Kara Stewart  Procedure(s) Performed: Procedure(s) (LRB): ESOPHAGOGASTRODUODENOSCOPY (EGD) WITH PROPOFOL (N/A) ESOPHAGEAL DILATION (N/A)  Patient location during evaluation: PACU Anesthesia Type: MAC Level of consciousness: awake and alert and oriented Pain management: pain level controlled Vital Signs Assessment: post-procedure vital signs reviewed and stable Respiratory status: spontaneous breathing and patient connected to nasal cannula oxygen Cardiovascular status: stable Postop Assessment: no signs of nausea or vomiting Anesthetic complications: no    Last Vitals:  Vitals:   11/21/15 1255 11/21/15 1300  BP: 143/87 143/91  Pulse:    Resp: 16 (!) 33  Temp:      Last Pain:  Vitals:   11/21/15 1244  TempSrc: Oral  PainSc: 8                  Lillah Standre A

## 2015-11-21 NOTE — ED Notes (Signed)
PT went to Endo at this time for EGD procedure. PT to come back to ED Room 8 after d/t no rooms for admission.

## 2015-11-21 NOTE — ED Notes (Signed)
Pt reports that she used to drink a lot, but now only drinks 4 shots every other day. She reports a fall and then tonight throwing up blood

## 2015-11-21 NOTE — Op Note (Signed)
[facility] Patient Name: Kara Stewart Procedure Date: 11/21/2015 1:06 PM MRN: [id number] Date of Birth: [birth year] Attending MD: Kara Pac , MD CSN: [id number] Age: 40 Admit Type: Inpatient Procedure:                Upper GI endoscopy - diagnostic Indications:              Hematemesis; dysphagia Providers:                Kara Pac, MD, Loma Messing B. Patsy Lager, RN,                            Lollie Marrow. Val Eagle, Technician Referring MD:              Medicines:                Propofol per Anesthesia Complications:            No immediate complications. Estimated Blood Loss:     Estimated blood loss: none. Procedure:                Pre-Anesthesia Assessment:                           - Prior to the procedure, a History and Physical                            was performed, and patient medications and                            allergies were reviewed. The patient's tolerance of                            previous anesthesia was also reviewed. The risks                            and benefits of the procedure and the sedation                            options and risks were discussed with the patient.                            All questions were answered, and informed consent                            was obtained. Prior Anticoagulants: The patient has                            taken no previous anticoagulant or antiplatelet                            agents. ASA Grade Assessment: II - A patient with                            mild systemic disease. After reviewing the risks  and benefits, the patient was deemed in                            satisfactory condition to undergo the procedure.                           After obtaining informed consent, the endoscope was                            passed under direct vision. Throughout the                            procedure, the patient's blood pressure, pulse, and                 oxygen saturations were monitored continuously. The                            [id number] ([id number]) scope was introduced through the                            mouth, and advanced to the second part of duodenum.                            The upper GI endoscopy was accomplished without                            difficulty. The patient tolerated the procedure                            well. Scope In: 1:11:47 PM Scope Out: 1:15:24 PM Total Procedure Duration: 0 hours 3 minutes 37 seconds  Findings:      (2) area of mucosal direct disruption with overlying fixed clot at the       GE junction?"likely representing Mallory-Weiss tear. No active       bleeding.LA Grade B (one or more mucosal breaks greater than 5 mm, not       extending between the tops of two mucosal folds) esophagitis was found       34 to 37 cm from the incisors. Esophagus patent throughout its course.       No Barrett's epithelium seen.      The entire examined stomach was normal.      The duodenal bulb and second portion of the duodenum were normal. Impression:               - Mallory-Weiss tear?"likely cause of hematemesis.                            LA Grade B esophagitis.                           - Normal stomach.                           - Normal duodenal bulb and second portion of the  duodenum.                           - No specimens collected. No esophageal dilation                            performed Moderate Sedation:      Moderate (conscious) sedation was personally administered by an       anesthesia professional. The following parameters were monitored: oxygen       saturation, heart rate, blood pressure, respiratory rate, EKG, adequacy       of pulmonary ventilation, and response to care. Total physician       intraservice time was 14 minutes. Recommendation:           - Return patient to hospital ward for ongoing care.                           - Clear liquid  diet.                           - Use Protonix (pantoprazole) 40 mg PO BID.                           - Use sucralfate suspension 1 gram PO QID.                            Antiemetic therapy when necessary                           - Continue present medications. Procedure Code(s):        --- Professional ---                           [id number], Esophagogastroduodenoscopy, flexible,                            transoral; diagnostic, including collection of                            specimen(s) by brushing or washing, when performed                            (separate procedure) Diagnosis Code(s):        --- Professional ---                           K20.9, Esophagitis, unspecified                           K92.0, Hematemesis CPT copyright 2016 [employer]. All rights reserved. The codes documented in this report are preliminary and upon coder review may  be revised to meet current compliance requirements. Kara Friendsobert M. Verlon Pischke, MD Kara Pacobert Michael Breigh Annett, MD 11/21/2015 1:28:39 PM This report has been signed electronically. Number of Addenda: 0

## 2015-11-21 NOTE — ED Notes (Signed)
hemocult by RN Result- negative

## 2015-11-21 NOTE — ED Triage Notes (Signed)
Pt reports being seen here early on Wednesday am after a fall, states this evening the patient states she has vomited bright red blood x 2 and is having severe pain to her left ribcage.  Pt appears very anxious
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
## 2015-11-21 NOTE — ED Notes (Addendum)
Dr Knapp in to assess 

## 2015-11-21 NOTE — Anesthesia Procedure Notes (Signed)
Procedure Name: MAC Date/Time: 11/21/2015 1:02 PM Performed by: Pernell DupreADAMS, AMY A Pre-anesthesia Checklist: Emergency Drugs available, Patient identified, Suction available, Patient being monitored and Timeout performed Oxygen Delivery Method: Simple face mask

## 2015-11-21 NOTE — Transfer of Care (Signed)
Immediate Anesthesia Transfer of Care Note  Patient: Kara Stewart  Procedure(s) Performed: Procedure(s): ESOPHAGOGASTRODUODENOSCOPY (EGD) WITH PROPOFOL (N/A) ESOPHAGEAL DILATION (N/A)  Patient Location: PACU  Anesthesia Type:MAC  Level of Consciousness: awake, alert , oriented and patient cooperative  Airway & Oxygen Therapy: Patient Spontanous Breathing and Patient connected to nasal cannula oxygen  Post-op Assessment: Report given to RN and Post -op Vital signs reviewed and stable  Post vital signs: Reviewed and stable  Last Vitals:  Vitals:   11/21/15 1255 11/21/15 1300  BP: 143/87 143/91  Pulse:    Resp: 16 (!) 33  Temp:      Last Pain:  Vitals:   11/21/15 1244  TempSrc: Oral  PainSc: 8       Patients Stated Pain Goal: 9 (11/21/15 1244)  Complications: No apparent anesthesia complications

## 2015-11-21 NOTE — Progress Notes (Signed)
Kara CornfieldStephanie Barriere-Winchester is a 40 y.o. female with hx of anxiety, alcohol abuse, hx of SVT on betablocker, presents with left sided chest pain , anxiety and hematemesis. Gastroenterology consulted and underwent EGD which showed MW tear with esophagitis. She was started on clear liquid diet and admitted for further evaluation.  She was admitted earlier today and see Dr Irwin BrakemanLe's H&P for details.  No more episodes of hematemesis, would resume IV pepcid. .  On CIWA  For alcohol abuse.  Monitor.   Kathlen ModyVijaya Latina Frank, MD [phone]

## 2015-11-21 NOTE — Anesthesia Preprocedure Evaluation (Signed)
Anesthesia Evaluation  Patient identified by MRN, date of birth, ID band Patient awake    Reviewed: Allergy & Precautions, H&P , NPO status , Patient's Chart, lab work & pertinent test results  Airway Mallampati: II  TM Distance: >3 FB     Dental  (+) Teeth Intact   Pulmonary asthma ,    breath sounds clear to auscultation       Cardiovascular hypertension, Pt. on medications  Rhythm:Regular Rate:Normal     Neuro/Psych PSYCHIATRIC DISORDERS Anxiety    GI/Hepatic hiatal hernia, GERD  ,(+)     substance abuse  alcohol use, Hepatitis -  Endo/Other  Hypothyroidism   Renal/GU      Musculoskeletal   Abdominal   Peds  Hematology   Anesthesia Other Findings   Reproductive/Obstetrics                             Anesthesia Physical Anesthesia Plan  ASA: III  Anesthesia Plan: MAC   Post-op Pain Management:    Induction: Intravenous  Airway Management Planned: Simple Face Mask  Additional Equipment:   Intra-op Plan:   Post-operative Plan:   Informed Consent: I have reviewed the patients History and Physical, chart, labs and discussed the procedure including the risks, benefits and alternatives for the proposed anesthesia with the patient or authorized representative who has indicated his/her understanding and acceptance.     Plan Discussed with:   Anesthesia Plan Comments:         Anesthesia Quick Evaluation

## 2015-11-21 NOTE — ED Notes (Signed)
Patient on cardiac monitor. Patient went to restroom. Patient trying to make herself fall. Patient saying oh God, oh God I don't feel good".
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
## 2015-11-21 NOTE — ED Notes (Signed)
Pt coughing up white phlegm and darkened blood- She is near histrionic; hyperventilating and repeating oh God, oh God, I don't want to die. She is reassured and redirected.

## 2015-11-22 ENCOUNTER — Observation Stay (HOSPITAL_COMMUNITY): Payer: Self-pay

## 2015-11-22 DIAGNOSIS — F419 Anxiety disorder, unspecified: Secondary | ICD-10-CM

## 2015-11-22 DIAGNOSIS — R1084 Generalized abdominal pain: Secondary | ICD-10-CM

## 2015-11-22 DIAGNOSIS — K219 Gastro-esophageal reflux disease without esophagitis: Secondary | ICD-10-CM

## 2015-11-22 DIAGNOSIS — K226 Gastro-esophageal laceration-hemorrhage syndrome: Principal | ICD-10-CM

## 2015-11-22 LAB — CBC
HCT: 34.2 % — ABNORMAL LOW (ref 36.0–46.0)
HEMOGLOBIN: 11.7 g/dL — AB (ref 12.0–15.0)
MCH: 38.6 pg — AB (ref 26.0–34.0)
MCHC: 34.2 g/dL (ref 30.0–36.0)
MCV: 112.9 fL — ABNORMAL HIGH (ref 78.0–100.0)
PLATELETS: 152 10*3/uL (ref 150–400)
RBC: 3.03 MIL/uL — AB (ref 3.87–5.11)
RDW: 12.6 % (ref 11.5–15.5)
WBC: 3.6 10*3/uL — ABNORMAL LOW (ref 4.0–10.5)

## 2015-11-22 LAB — BASIC METABOLIC PANEL
Anion gap: 4 — ABNORMAL LOW (ref 5–15)
CALCIUM: 8.1 mg/dL — AB (ref 8.9–10.3)
CO2: 23 mmol/L (ref 22–32)
CREATININE: 0.64 mg/dL (ref 0.44–1.00)
Chloride: 110 mmol/L (ref 101–111)
GFR calc non Af Amer: >60 mL/min (ref >60)
Glucose, Bld: 108 mg/dL — ABNORMAL HIGH (ref 65–99)
Potassium: 3.7 mmol/L (ref 3.5–5.1)
SODIUM: 137 mmol/L (ref 135–145)

## 2015-11-22 LAB — PROTIME-INR
INR: 1.11
PROTHROMBIN TIME: 14.3 s (ref 11.4–15.2)

## 2015-11-22 MED ORDER — MOMETASONE FURO-FORMOTEROL FUM 100-5 MCG/ACT IN AERO
2.0000 | INHALATION_SPRAY | Freq: Two times a day (BID) | RESPIRATORY_TRACT | Status: DC
  Administered 2015-11-22 – 2015-11-23 (×2): 2 via RESPIRATORY_TRACT
  Filled 2015-11-22: qty 8.8

## 2015-11-22 MED ORDER — MORPHINE SULFATE (PF) 2 MG/ML IV SOLN
1.0000 mg | INTRAVENOUS | Status: DC | PRN
  Administered 2015-11-22 – 2015-11-23 (×3): 2 mg via INTRAVENOUS
  Filled 2015-11-22 (×3): qty 1

## 2015-11-22 MED ORDER — GI COCKTAIL ~~LOC~~
30.0000 mL | Freq: Three times a day (TID) | ORAL | Status: DC | PRN
  Administered 2015-11-22 – 2015-11-23 (×4): 30 mL via ORAL
  Filled 2015-11-22 (×5): qty 30

## 2015-11-22 MED ORDER — MOMETASONE FURO-FORMOTEROL FUM 100-5 MCG/ACT IN AERO
INHALATION_SPRAY | RESPIRATORY_TRACT | Status: AC
  Filled 2015-11-22: qty 8.8

## 2015-11-22 MED ORDER — TRAMADOL HCL 50 MG PO TABS: 100.0000 mg | ORAL_TABLET | Freq: Four times a day (QID) | ORAL | Status: DC | PRN

## 2015-11-22 MED ORDER — GUAIFENESIN-DM 100-10 MG/5ML PO SYRP: 5.0000 mL | ORAL_SOLUTION | ORAL | Status: DC | PRN

## 2015-11-22 MED ORDER — PANTOPRAZOLE SODIUM 40 MG PO TBEC
40.0000 mg | DELAYED_RELEASE_TABLET | Freq: Two times a day (BID) | ORAL | Status: DC
  Administered 2015-11-22 – 2015-11-23 (×2): 40 mg via ORAL
  Filled 2015-11-22 (×2): qty 1

## 2015-11-22 NOTE — Progress Notes (Signed)
PROGRESS NOTE    Kara Stewart  ZOX:[id number] DOB: [birth year] DOA: 11/21/2015 PCP: No PCP Per Patient   Brief Narrative:Kara Drohan-Winchesteris a [age] y.o.femalewith hx of anxiety, alcohol abuse, hx of SVT on betablocker, presents with left sided chest pain , anxiety and hematemesis. Gastroenterology consulted and underwent EGD which showed MW tear with GRADE B esophagitis.   Assessment & Plan:   Active Problems:   GERD (gastroesophageal reflux disease)   SVT (supraventricular tachycardia) (HCC)   Obesity (BMI 30-39.9)   Abdominal pain   ETOH abuse   Hematemesis   Mallory-Weiss tear   Mild episode of hematemesis: Probably from vomiting, which she reports brought on by coughing.  CXR does not show any pneumonia. No wheezing on exam.  Cough medications.  PPI.  Underwent EGD showing MW tear and grade B esophagitis.  Advanced her diet and monitor for one more day.  Hemoglobin stable around 11.  No further hematemesis.    SVT; no episodes since admission.  On BB.    etoh abuse> On CIWA protocol.   Abdominal pain / nausea: improved.    DVT prophylaxis: SCD'S Code Status: (Full) Family Communication: none at bedside Disposition Plan: pending further eval.    Consultants:   Gastroenterology.    Procedures: none    Antimicrobials: none.    Subjective: Reports worsening cough, that brought about the vomiting.   Objective: Vitals:   11/21/15 1420 11/21/15 1453 11/21/15 2109 11/22/15 0514  BP:  (!) 141/84 129/79 108/65  Pulse:  78 85 84  Resp:  20 20 20   Temp:  97.8 F (36.6 C) 98.5 F (36.9 C) 97.9 F (36.6 C)  TempSrc:  Oral Oral Oral  SpO2:  98% 100% 99%  Weight: 76.2 kg (168 lb 1.6 oz)     Height: 5' 4.5" (1.638 m)       Intake/Output Summary (Last 24 hours) at 11/22/15 1409 Last data filed at 11/22/15 0900  Gross per 24 hour  Intake           509.17 ml  Output                0 ml  Net           509.17 ml   Filed Weights     11/21/15 0137 11/21/15 1244 11/21/15 1420  Weight: 72.6 kg (160 lb) 72.6 kg (160 lb) 76.2 kg (168 lb 1.6 oz)    Examination:  General exam: Appears anxious.  Respiratory system: Clear to auscultation. Respiratory effort normal. Cardiovascular system: S1 & S2 heard, RRR. No JVD, murmurs, rubs, gallops or clicks. No pedal edema. Gastrointestinal system: Abdomen is nondistended, soft and nontender. No organomegaly or masses felt. Normal bowel sounds heard. Central nervous system: Alert and oriented. No focal neurological deficits. Extremities: Symmetric 5 x 5 power. Skin: No rashes, lesions or ulcers     Data Reviewed: I have personally reviewed following labs and imaging studies  CBC:  Recent Labs Lab 11/21/15 0232 11/21/15 0835 11/21/15 1221 11/22/15 0649  WBC 4.5 5.8 5.1 3.6*  HGB 13.5 12.6 12.2 11.7*  HCT 38.1 36.3 35.2* 34.2*  MCV 109.5* 111.0* 111.0* 112.9*  PLT 206 183 182 152   Basic Metabolic Panel:  Recent Labs Lab 11/21/15 0232 11/22/15 0649  NA 140 137  K 3.4* 3.7  CL 108 110  CO2 22 23  GLUCOSE 83 108*  BUN 8 <5*  CREATININE 0.68 0.64  CALCIUM 9.4 8.1*   GFR: Estimated Creatinine Clearance: 95.5 mL/min (  by C-G formula based on SCr of 0.8 mg/dL). Liver Function Tests:  Recent Labs Lab 11/21/15 0232  AST 196*  ALT 114*  ALKPHOS 84  BILITOT 1.1  PROT 7.1  ALBUMIN 3.9    Recent Labs Lab 11/21/15 0237  LIPASE 58*   No results for input(s): AMMONIA in the last 168 hours. Coagulation Profile:  Recent Labs Lab 11/22/15 0649  INR 1.11   Cardiac Enzymes: No results for input(s): CKTOTAL, CKMB, CKMBINDEX, TROPONINI in the last 168 hours. BNP (last 3 results) No results for input(s): PROBNP in the last 8760 hours. HbA1C: No results for input(s): HGBA1C in the last 72 hours. CBG: No results for input(s): GLUCAP in the last 168 hours. Lipid Profile: No results for input(s): CHOL, HDL, LDLCALC, TRIG, CHOLHDL, LDLDIRECT in the last 72  hours. Thyroid Function Tests:  Recent Labs  11/21/15 0835  TSH 1.675   Anemia Panel: No results for input(s): VITAMINB12, FOLATE, FERRITIN, TIBC, IRON, RETICCTPCT in the last 72 hours. Sepsis Labs: No results for input(s): PROCALCITON, LATICACIDVEN in the last 168 hours.  No results found for this or any previous visit (from the past 240 hour(s)).       Radiology Studies: Dg Chest 2 View  Result Date: 11/22/2015 CLINICAL DATA:  Cough following fall several days ago, initial encounter EXAM: CHEST  2 VIEW COMPARISON:  11/20/2015 FINDINGS: Cardiac shadow is within normal limits. Lungs remain clear. No pneumothorax or effusion is seen. No acute bony abnormality is noted. IMPRESSION: No acute abnormality seen. Electronically Signed   By: Alcide CleverMark  Lukens M.D.   On: 11/22/2015 12:10        Scheduled Meds: . famotidine (PEPCID) IV  20 mg Intravenous Q12H  . folic acid  1 mg Oral Daily  . multivitamin with minerals  1 tablet Oral Daily  . pantoprazole  40 mg Oral BID  . sodium chloride flush  3 mL Intravenous Q12H  . thiamine  100 mg Oral Daily   Or  . thiamine  100 mg Intravenous Daily   Continuous Infusions:    LOS: 0 days    Time spent: 30 minutes.     Kathlen ModyAKULA,Kerolos Nehme, MD [facility] Pager [phone]  If 7PM-7AM, please contact night-coverage [email or web address] Password [email or web address] 11/22/2015, 2:09 PM

## 2015-11-22 NOTE — Progress Notes (Signed)
Subjective: Complains of left-sided rib pain and esophageal discomfort this morning. No further hematemesis. No nausea. Tolerating clear liquids well. Is asking to advance diet. No on PPI at home. No bowel movement in the past 24 hours. Currently on IV Pepcid and po protonix 40 mg bid. No other GI complaints.   Objective: Vital signs in last 24 hours: Temp:  [97.8 F (36.6 C)-98.5 F (36.9 C)] 97.9 F (36.6 C) (08/18 0514) Pulse Rate:  [77-102] 84 (08/18 0514) Resp:  [12-33] 20 (08/18 0514) BP: (108-143)/(65-94) 108/65 (08/18 0514) SpO2:  [96 %-100 %] 99 % (08/18 0514) Weight:  [160 lb (72.6 kg)-168 lb 1.6 oz (76.2 kg)] 168 lb 1.6 oz (76.2 kg) (08/17 1420) Last BM Date: 11/21/15 General:   Alert and oriented, pleasant Heart:  S1, S2 present, no murmurs noted.  Lungs: Clear to auscultation bilaterally, without wheezing, rales, or rhonchi.  Abdomen:  Bowel sounds present, soft, non-distended. Mild epigastric TTP. No HSM or hernias noted. No rebound or guarding. No masses appreciated  Msk:  Symmetrical without gross deformities. Extremities:  Without clubbing or edema. Neurologic:  Alert and  oriented x4;  grossly normal neurologically. Psych:  Alert and cooperative. Normal mood and affect.  Intake/Output from previous day: 08/17 0701 - 08/18 0700 In: 1069.2 [P.O.:240; I.V.:829.2] Out: -  Intake/Output this shift: Total I/O In: 240 [P.O.:240] Out: -   Lab Results:  Recent Labs  11/21/15 0835 11/21/15 1221 11/22/15 0649  WBC 5.8 5.1 3.6*  HGB 12.6 12.2 11.7*  HCT 36.3 35.2* 34.2*  PLT 183 182 152   BMET  Recent Labs  11/21/15 0232 11/22/15 0649  NA 140 137  K 3.4* 3.7  CL 108 110  CO2 22 23  GLUCOSE 83 108*  BUN 8 <5*  CREATININE 0.68 0.64  CALCIUM 9.4 8.1*   LFT  Recent Labs  11/21/15 0232  PROT 7.1  ALBUMIN 3.9  AST 196*  ALT 114*  ALKPHOS 84  BILITOT 1.1   PT/INR  Recent Labs  11/22/15 0649  LABPROT 14.3  INR 1.11   Hepatitis  Panel No results for input(s): HEPBSAG, HCVAB, HEPAIGM, HEPBIGM in the last 72 hours.   Studies/Results: No results found.  Assessment: 40 y/o female with h/o etoh abuse/gastritis/esophagitis/esophageal stricture who presents with acute onset hematemesis. No evidence of chronic liver disease on recent imaging. Limited NSAIDs/aspirin use per patient.  She also has evidence of alcoholic hepatitis given AST/ALT ratio. Suspect patient has had pretty significant ongoing alcohol abuse given macrocytosis. EGD performed 11/21/15 found Mallory-Weiss tear likely etiology of hematemesis, LA Grade B esophagitis, normal stomach and duodenum; esophageal dilation not performed.   Today her CBC shows mild decline in hgb to 11.7 (from 12.2 yesterday) but also decline in WBC, plt, and hct query hydration effect.   Seems improved today, some esophageal discomfort not on PPI at home, is on PPI here. Likely discomfort from Mallory-Weiss tear. Is allergic to Lidocaine and cannot take viscous lidocaine for symptomatic improvement. Discussed need to prevent further N/V. States she only vomits post-tussive and has significant coughing in the mornings, thinks it's related to mold in her workplace triggering asthma.  Plan: 1. Monitor for further bleeding 2. Advance to soft diet 3. Will need PPI at home 4. Anti-cough measures per hospitalist 5. Antiemetics as needed   Thank you for allowing us to participate in the care of [facility]  Wynne DustEric Gill, DNP, AGNP-C Adult & Gerontological Nurse Practitioner [facility]  LOS: 0 days    11/22/2015, 9:44 AM
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
## 2015-11-23 DIAGNOSIS — K210 Gastro-esophageal reflux disease with esophagitis: Secondary | ICD-10-CM

## 2015-11-23 LAB — CBC
HEMATOCRIT: 33.7 % — AB (ref 36.0–46.0)
HEMOGLOBIN: 11.5 g/dL — AB (ref 12.0–15.0)
MCH: 38.5 pg — AB (ref 26.0–34.0)
MCHC: 34.1 g/dL (ref 30.0–36.0)
MCV: 112.7 fL — AB (ref 78.0–100.0)
Platelets: 157 10*3/uL (ref 150–400)
RBC: 2.99 MIL/uL — ABNORMAL LOW (ref 3.87–5.11)
RDW: 12.4 % (ref 11.5–15.5)
WBC: 3.3 10*3/uL — ABNORMAL LOW (ref 4.0–10.5)

## 2015-11-23 MED ORDER — MOMETASONE FURO-FORMOTEROL FUM 100-5 MCG/ACT IN AERO: 2.0000 | INHALATION_SPRAY | Freq: Two times a day (BID) | RESPIRATORY_TRACT | 0 refills | Status: DC

## 2015-11-23 MED ORDER — GUAIFENESIN-DM 100-10 MG/5ML PO SYRP: 5.0000 mL | ORAL_SOLUTION | ORAL | 0 refills | Status: DC | PRN

## 2015-11-23 MED ORDER — THIAMINE HCL 100 MG PO TABS: 100.0000 mg | ORAL_TABLET | Freq: Every day | ORAL | 0 refills | Status: DC

## 2015-11-23 MED ORDER — LORAZEPAM 1 MG PO TABS: 1.0000 mg | ORAL_TABLET | Freq: Three times a day (TID) | ORAL | 0 refills | Status: DC | PRN

## 2015-11-23 MED ORDER — FOLIC ACID 1 MG PO TABS: 1.0000 mg | ORAL_TABLET | Freq: Every day | ORAL | 0 refills | Status: DC

## 2015-11-23 MED ORDER — TRAMADOL HCL 50 MG PO TABS: 50.0000 mg | ORAL_TABLET | Freq: Four times a day (QID) | ORAL | 0 refills | Status: DC | PRN

## 2015-11-23 MED ORDER — PANTOPRAZOLE SODIUM 40 MG PO TBEC: 40.0000 mg | DELAYED_RELEASE_TABLET | Freq: Two times a day (BID) | ORAL | 1 refills | Status: DC

## 2015-11-23 NOTE — Progress Notes (Signed)
Patient discharged home; IV removed site intact.  Patient sent home with discharge papers, belongings, and prescriptions.

## 2015-11-24 NOTE — Discharge Summary (Addendum)
Physician Discharge Summary  Kara Stewart MVH:[id number] DOB: 05/16/1975 DOA: 11/21/2015  PCP: No PCP Per Patient  Admit date: 11/21/2015 Discharge date: 11/23/2015  Admitted From: Home Disposition:  Home  Recommendations for Outpatient Follow-up:  1. Follow up with PCP in 1-2 weeks 2. Please obtain BMP/CBC in one week     Discharge Condition:stable.  CODE STATUS:full code.  Diet recommendation: Heart Healthy /  Brief/Interim Summary: Kara CornfieldStephanie Carolan-Winchesteris a 40 y.o.femalewith hx of anxiety, alcohol abuse, hx of SVT on betablocker, presents with left sided chest pain , anxiety and hematemesis. Gastroenterology consulted and underwent EGD which showed MW tear with GRADE B esophagitis.   Discharge Diagnoses:  Active Problems:   GERD (gastroesophageal reflux disease)   SVT (supraventricular tachycardia) (HCC)   Obesity (BMI 30-39.9)   Abdominal pain   ETOH abuse   Hematemesis   Mallory-Weiss tear  Mild episode of hematemesis: Probably from vomiting, which she reports brought on by coughing.  CXR does not show any pneumonia. No wheezing on exam.  Cough medications.  PPI.  Underwent EGD showing MW tear and grade B esophagitis.  Advanced her diet and stable on discharge.  Hemoglobin stable around 11.  No further hematemesis.    SVT; no episodes since admission.  On BB.    etoh abuse> On CIWA protocol.   Abdominal pain / nausea: improved.    Discharge Instructions  Discharge Instructions    Diet general    Complete by:  As directed   Discharge instructions    Complete by:  As directed   [facility]. Please follow upw ith outpatient resources for alcohol rehab.       Medication List    STOP taking these medications   aspirin-acetaminophen-caffeine 250-250-65 MG tablet Commonly known as:  EXCEDRIN MIGRAINE   cyclobenzaprine 10 MG tablet Commonly known as:  FLEXERIL    dextromethorphan-guaiFENesin 30-600 MG 12hr tablet Commonly known as:  MUCINEX DM Replaced by:  guaiFENesin-dextromethorphan 100-10 MG/5ML syrup     TAKE these medications   ALLERGY RELIEF EYE DROPS OP Apply 2 drops to eye daily.   folic acid 1 MG tablet Commonly known as:  FOLVITE Take 1 tablet (1 mg total) by mouth daily.   guaiFENesin-dextromethorphan 100-10 MG/5ML syrup Commonly known as:  ROBITUSSIN DM Take 5 mLs by mouth every 4 (four) hours as needed for cough. Replaces:  dextromethorphan-guaiFENesin 30-600 MG 12hr tablet   LORazepam 1 MG tablet Commonly known as:  ATIVAN Take 1 tablet (1 mg total) by mouth 3 (three) times daily as needed for anxiety.   metoprolol tartrate 25 MG tablet Commonly known as:  LOPRESSOR Take 12.5 mg by mouth as needed (for palpitations). Reported on 08/28/2015   mometasone-formoterol 100-5 MCG/ACT Aero Commonly known as:  DULERA Inhale 2 puffs into the lungs 2 (two) times daily.   multivitamins ther. w/minerals Tabs tablet Take 1 tablet by mouth daily.   pantoprazole 40 MG tablet Commonly known as:  PROTONIX Take 1 tablet (40 mg total) by mouth 2 (two) times daily before a meal.   REFRESH OPTIVE 1-0.9 % Gel Generic drug:  Carboxymethylcellul-Glycerin Apply 1 drop to eye daily as needed (dry eyes).   sucralfate 1 g tablet Commonly known as:  CARAFATE Take 1 g by mouth 4 (four) times daily.   thiamine 100 MG tablet Take 1 tablet (100 mg total) by mouth daily.   traMADol 50 MG tablet Commonly known as:  ULTRAM Take 1 tablet (50 mg  total) by mouth every 6 (six) hours as needed for severe pain.       Allergies  Allergen Reactions  . Penicillins Anaphylaxis    Has patient had a PCN reaction causing immediate rash, facial/tongue/throat swelling, SOB or lightheadedness with hypotension: yes Has patient had a PCN reaction causing severe rash involving mucus membranes or skin necrosis: No Has patient had a PCN reaction that  required hospitalization Yes Has patient had a PCN reaction occurring within the last 10 years: No If all of the above answers are "NO", then may proceed with Cephalosporin use.   . Lidocaine Other (See Comments)    hallucinations    Consultations:  Gastroenterology.   Procedures/Studies: Dg Chest 2 View  Result Date: 11/22/2015 CLINICAL DATA:  Cough following fall several days ago, initial encounter EXAM: CHEST  2 VIEW COMPARISON:  11/20/2015 FINDINGS: Cardiac shadow is within normal limits. Lungs remain clear. No pneumothorax or effusion is seen. No acute bony abnormality is noted. IMPRESSION: No acute abnormality seen. Electronically Signed   By: Alcide CleverMark  Lukens M.D.   On: 11/22/2015 12:10   Dg Chest 2 View  Result Date: 10/27/2015 CLINICAL DATA:  Left-sided chest pain EXAM: CHEST  2 VIEW COMPARISON:  September 28, 2015 FINDINGS: The heart size and mediastinal contours are within normal limits. Both lungs are clear. The visualized skeletal structures are unremarkable. IMPRESSION: No active cardiopulmonary disease. Electronically Signed   By: Gerome Samavid  Williams III M.D   On: 10/27/2015 10:08  Dg Ribs Unilateral W/chest Left  Result Date: 11/20/2015 CLINICAL DATA:  Pain following fall EXAM: LEFT RIBS AND CHEST - 3+ VIEW COMPARISON:  Chest radiograph October 27, 2015 FINDINGS: Frontal chest as well as oblique and cone-down lower rib images were obtained. Lungs are clear. Heart size and pulmonary vascularity normal. No adenopathy. There is no pneumothorax or pleural effusion evident. There are old healed fractures of the anterior left ninth and tenth ribs. No acute fracture evident. IMPRESSION: Old healed fractures of the anterior left ninth and tenth ribs with mild remodeling in these areas. Lungs clear. No pneumothorax. Electronically Signed   By: Bretta BangWilliam  Woodruff III M.D.   On: 11/20/2015 07:12       Subjective:   Discharge Exam: Vitals:   11/22/15 1428 11/23/15 0515  BP: 114/67 (!) 107/59   Pulse: 87 85  Resp: 20 18  Temp: 98.4 F (36.9 C) 98.4 F (36.9 C)   Vitals:   11/22/15 1428 11/22/15 2127 11/23/15 0515 11/23/15 0942  BP: 114/67  (!) 107/59   Pulse: 87  85   Resp: 20  18   Temp: 98.4 F (36.9 C)  98.4 F (36.9 C)   TempSrc:   Oral   SpO2: 100% 98% 97% 98%  Weight:      Height:        General: Pt is alert, awake, not in acute distress Cardiovascular: RRR, S1/S2 +, no rubs, no gallops Respiratory: CTA bilaterally, no wheezing, no rhonchi Abdominal: Soft, NT, ND, bowel sounds + Extremities: no edema, no cyanosis    The results of significant diagnostics from this hospitalization (including imaging, microbiology, ancillary and laboratory) are listed below for reference.     Microbiology: No results found for this or any previous visit (from the past 240 hour(s)).   Labs: BNP (last 3 results) No results for input(s): BNP in the last 8760 hours. Basic Metabolic Panel:  Recent Labs Lab 11/21/15 0232 11/22/15 0649  NA 140 137  K 3.4* 3.7  CL 108 110  CO2 22 23  GLUCOSE 83 108*  BUN 8 <5*  CREATININE 0.68 0.64  CALCIUM 9.4 8.1*   Liver Function Tests:  Recent Labs Lab 11/21/15 0232  AST 196*  ALT 114*  ALKPHOS 84  BILITOT 1.1  PROT 7.1  ALBUMIN 3.9    Recent Labs Lab 11/21/15 0237  LIPASE 58*   No results for input(s): AMMONIA in the last 168 hours. CBC:  Recent Labs Lab 11/21/15 0232 11/21/15 0835 11/21/15 1221 11/22/15 0649 11/23/15 1015  WBC 4.5 5.8 5.1 3.6* 3.3*  HGB 13.5 12.6 12.2 11.7* 11.5*  HCT 38.1 36.3 35.2* 34.2* 33.7*  MCV 109.5* 111.0* 111.0* 112.9* 112.7*  PLT 206 183 182 152 157   Cardiac Enzymes: No results for input(s): CKTOTAL, CKMB, CKMBINDEX, TROPONINI in the last 168 hours. BNP: Invalid input(s): POCBNP CBG: No results for input(s): GLUCAP in the last 168 hours. D-Dimer No results for input(s): DDIMER in the last 72 hours. Hgb A1c No results for input(s): HGBA1C in the last 72  hours. Lipid Profile No results for input(s): CHOL, HDL, LDLCALC, TRIG, CHOLHDL, LDLDIRECT in the last 72 hours. Thyroid function studies No results for input(s): TSH, T4TOTAL, T3FREE, THYROIDAB in the last 72 hours.  Invalid input(s): FREET3 Anemia work up No results for input(s): VITAMINB12, FOLATE, FERRITIN, TIBC, IRON, RETICCTPCT in the last 72 hours. Urinalysis    Component Value Date/Time   COLORURINE YELLOW 08/28/2015 2200   APPEARANCEUR CLEAR 08/28/2015 2200   LABSPEC 1.010 08/28/2015 2200   PHURINE 7.5 08/28/2015 2200   GLUCOSEU NEGATIVE 08/28/2015 2200   HGBUR NEGATIVE 08/28/2015 2200   BILIRUBINUR NEGATIVE 08/28/2015 2200   KETONESUR NEGATIVE 08/28/2015 2200   PROTEINUR 30 (A) 08/28/2015 2200   UROBILINOGEN 1.0 11/04/2014 0900   NITRITE NEGATIVE 08/28/2015 2200   LEUKOCYTESUR NEGATIVE 08/28/2015 2200   Sepsis Labs Invalid input(s): PROCALCITONIN,  WBC,  LACTICIDVEN Microbiology No results found for this or any previous visit (from the past 240 hour(s)).   Time coordinating discharge: Over 30 minutes  SIGNED:   Kathlen Mody, MD  [facility] 11/24/2015, 11:31 PM Pager   If 7PM-7AM, please contact night-coverage [email or web address] Password [email or web address]

## 2015-11-29 ENCOUNTER — Encounter (HOSPITAL_COMMUNITY): Payer: Self-pay | Admitting: Internal Medicine

## 2016-01-21 ENCOUNTER — Telehealth: Payer: Self-pay | Admitting: Gastroenterology

## 2016-01-21 NOTE — Telephone Encounter (Signed)
Tried to call pt for more information. Mail box full and could not leave a message.

## 2016-01-21 NOTE — Telephone Encounter (Signed)
[phone] PLEASE CALL PATIENT REGARDING A COUPON FOR AN INHALER THAT [name] PRESCRIBED FOR HER?  AND ALSO HAS QUESTIONS ABOUT THYROID NODULES FROM HER LAST PROCEDURE

## 2016-01-22 NOTE — Telephone Encounter (Signed)
Tried to call, mail box full and cannot leave a VM.

## 2016-02-19 ENCOUNTER — Emergency Department (HOSPITAL_COMMUNITY): Payer: Self-pay

## 2016-02-19 ENCOUNTER — Emergency Department (HOSPITAL_COMMUNITY)
Admission: EM | Admit: 2016-02-19 | Discharge: 2016-02-19 | Disposition: A | Discharge status: Other Acute Inpatient Hospital | Payer: Self-pay | Attending: Emergency Medicine | Admitting: Emergency Medicine

## 2016-02-19 ENCOUNTER — Encounter (HOSPITAL_COMMUNITY): Payer: Self-pay | Admitting: *Deleted

## 2016-02-19 DIAGNOSIS — R0789 Other chest pain: Secondary | ICD-10-CM | POA: Insufficient documentation

## 2016-02-19 DIAGNOSIS — F410 Panic disorder [episodic paroxysmal anxiety] without agoraphobia: Secondary | ICD-10-CM | POA: Insufficient documentation

## 2016-02-19 DIAGNOSIS — R0602 Shortness of breath: Secondary | ICD-10-CM | POA: Insufficient documentation

## 2016-02-19 DIAGNOSIS — R000 Tachycardia, unspecified: Secondary | ICD-10-CM | POA: Insufficient documentation

## 2016-02-19 DIAGNOSIS — Z79899 Other long term (current) drug therapy: Secondary | ICD-10-CM | POA: Insufficient documentation

## 2016-02-19 DIAGNOSIS — R079 Chest pain, unspecified: Secondary | ICD-10-CM

## 2016-02-19 DIAGNOSIS — E039 Hypothyroidism, unspecified: Secondary | ICD-10-CM | POA: Insufficient documentation

## 2016-02-19 DIAGNOSIS — I10 Essential (primary) hypertension: Secondary | ICD-10-CM | POA: Insufficient documentation

## 2016-02-19 LAB — BASIC METABOLIC PANEL
Anion gap: 12 (ref 5–15)
BUN: 7 mg/dL (ref 6–20)
CALCIUM: 9.7 mg/dL (ref 8.9–10.3)
CO2: 22 mmol/L (ref 22–32)
CREATININE: 0.83 mg/dL (ref 0.44–1.00)
Chloride: 103 mmol/L (ref 101–111)
GFR calc Af Amer: >60 mL/min (ref >60)
GLUCOSE: 103 mg/dL — AB (ref 65–99)
Potassium: 3.5 mmol/L (ref 3.5–5.1)
Sodium: 137 mmol/L (ref 135–145)

## 2016-02-19 LAB — RAPID URINE DRUG SCREEN, HOSP PERFORMED
AMPHETAMINES: NOT DETECTED
Barbiturates: NOT DETECTED
Benzodiazepines: NOT DETECTED
Cocaine: NOT DETECTED
Opiates: NOT DETECTED
TETRAHYDROCANNABINOL: NOT DETECTED

## 2016-02-19 LAB — HEPATIC FUNCTION PANEL
ALK PHOS: 76 U/L (ref 38–126)
ALT: 52 U/L (ref 14–54)
AST: 85 U/L — ABNORMAL HIGH (ref 15–41)
Albumin: 4.3 g/dL (ref 3.5–5.0)
BILIRUBIN DIRECT: 0.2 mg/dL (ref 0.1–0.5)
BILIRUBIN INDIRECT: 1.4 mg/dL — AB (ref 0.3–0.9)
BILIRUBIN TOTAL: 1.6 mg/dL — AB (ref 0.3–1.2)
Total Protein: 8 g/dL (ref 6.5–8.1)

## 2016-02-19 LAB — CBC WITH DIFFERENTIAL/PLATELET
BASOS ABS: 0.1 10*3/uL (ref 0.0–0.1)
Basophils Relative: 1 %
EOS PCT: 1 %
Eosinophils Absolute: 0.1 10*3/uL (ref 0.0–0.7)
HCT: 39.6 % (ref 36.0–46.0)
Hemoglobin: 13.8 g/dL (ref 12.0–15.0)
LYMPHS ABS: 1.5 10*3/uL (ref 0.7–4.0)
LYMPHS PCT: 16 %
MCH: 36 pg — AB (ref 26.0–34.0)
MCHC: 34.8 g/dL (ref 30.0–36.0)
MCV: 103.4 fL — AB (ref 78.0–100.0)
MONO ABS: 0.5 10*3/uL (ref 0.1–1.0)
Monocytes Relative: 6 %
Neutro Abs: 7.3 10*3/uL (ref 1.7–7.7)
Neutrophils Relative %: 76 %
PLATELETS: 247 10*3/uL (ref 150–400)
RBC: 3.83 MIL/uL — ABNORMAL LOW (ref 3.87–5.11)
RDW: 15.3 % (ref 11.5–15.5)
WBC: 9.5 10*3/uL (ref 4.0–10.5)

## 2016-02-19 LAB — I-STAT BETA HCG BLOOD, ED (MC, WL, AP ONLY): I-stat hCG, quantitative: <5 m[IU]/mL (ref <5)

## 2016-02-19 LAB — TROPONIN I: Troponin I: <0.03 ng/mL (ref <0.03)

## 2016-02-19 LAB — TSH: TSH: 2.675 u[IU]/mL (ref 0.350–4.500)

## 2016-02-19 LAB — D-DIMER, QUANTITATIVE: D-Dimer, Quant: 0.65 ug/mL-FEU — ABNORMAL HIGH (ref 0.00–0.50)

## 2016-02-19 MED ORDER — IOPAMIDOL (ISOVUE-370) INJECTION 76%
INTRAVENOUS | Status: AC
Start: 2016-02-19 — End: 2016-02-19
  Administered 2016-02-19: 65 mL via INTRAVENOUS
  Filled 2016-02-19: qty 100

## 2016-02-19 MED ORDER — LORAZEPAM 2 MG/ML IJ SOLN
1.0000 mg | Freq: Once | INTRAMUSCULAR | Status: AC
  Administered 2016-02-19: 1 mg via INTRAVENOUS
  Filled 2016-02-19: qty 1

## 2016-02-19 MED ORDER — SODIUM CHLORIDE 0.9 % IV BOLUS (SEPSIS)
1000.0000 mL | Freq: Once | INTRAVENOUS | Status: AC
  Administered 2016-02-19: 1000 mL via INTRAVENOUS

## 2016-02-19 MED ORDER — HYDROXYZINE HCL 10 MG PO TABS: 10.0000 mg | ORAL_TABLET | Freq: Four times a day (QID) | ORAL | 0 refills | Status: DC | PRN

## 2016-02-19 MED ORDER — MORPHINE SULFATE (PF) 4 MG/ML IV SOLN
4.0000 mg | Freq: Once | INTRAVENOUS | Status: AC
  Administered 2016-02-19: 4 mg via INTRAVENOUS
  Filled 2016-02-19: qty 1

## 2016-02-19 NOTE — ED Provider Notes (Signed)
This patient's care was assumed from Rochele RaringKristen Ward, MD in transfer [facility] for CT A of chest due to elevated D-dimer.  Please see her note for further.  Briefly, the patient presented with complaints of left-sided chest pain, shortness of breath and palpitations. She reports she thinks she is having a panic attack. Patient was tachycardic on presentation. Patient's chest x-ray, troponin and second troponin are all unremarkable. D-dimer was elevated and plan is for CTA chest to rule out PE. CT scanner is not working at [facility]. [facility]. Plan is for discharge if CTA chest is unremarkable.  [facility] the patient is resting comfortably and reports she is feeling better. She agrees with plan for CT angiogram.  CTA chest shows no evidence of pulmonary embolism. No acute process in the chest. It does show hepatic steatosis.   I discussed these results with the patient. Heart rate has improved to 90. She tells me she is feeling much better. Patient talks about her anxiety and encouraged her to follow-up with behavioral health. I provided her with behavioral health resources. We'll provide her with a short course of Atarax for anxiety until she can follow-up with primary care behavioral health. I discussed return precautions with the patient. I advised the patient to follow-up with their primary care provider this week. I advised the patient to return to the emergency department with new or worsening symptoms or new concerns. The patient verbalized understanding and agreement with plan.    Results for orders placed or performed during the hospital encounter of 02/19/16  CBC with Differential  Result Value Ref Range   WBC 9.5 4.0 - 10.5 K/uL   RBC 3.83 (L) 3.87 - 5.11 MIL/uL   Hemoglobin 13.8 12.0 - 15.0 g/dL   HCT 16.139.6 09.636.0 - 04.546.0 %   MCV 103.4 (H) 78.0 - 100.0 fL   MCH 36.0 (H) 26.0 - 34.0 pg   MCHC 34.8 30.0 - 36.0 g/dL   RDW 40.915.3  81.111.5 - 91.415.5 %   Platelets 247 150 - 400 K/uL   Neutrophils Relative % 76 %   Neutro Abs 7.3 1.7 - 7.7 K/uL   Lymphocytes Relative 16 %   Lymphs Abs 1.5 0.7 - 4.0 K/uL   Monocytes Relative 6 %   Monocytes Absolute 0.5 0.1 - 1.0 K/uL   Eosinophils Relative 1 %   Eosinophils Absolute 0.1 0.0 - 0.7 K/uL   Basophils Relative 1 %   Basophils Absolute 0.1 0.0 - 0.1 K/uL  Basic metabolic panel  Result Value Ref Range   Sodium 137 135 - 145 mmol/L   Potassium 3.5 3.5 - 5.1 mmol/L   Chloride 103 101 - 111 mmol/L   CO2 22 22 - 32 mmol/L   Glucose, Bld 103 (H) 65 - 99 mg/dL   BUN 7 6 - 20 mg/dL   Creatinine, Ser 7.820.83 0.44 - 1.00 mg/dL   Calcium 9.7 8.9 - 95.610.3 mg/dL   GFR calc non Af Amer >60 >60 mL/min   GFR calc Af Amer >60 >60 mL/min   Anion gap 12 5 - 15  Troponin I  Result Value Ref Range   Troponin I <0.03 <0.03 ng/mL  Rapid urine drug screen (hospital performed)  Result Value Ref Range   Opiates NONE DETECTED NONE DETECTED   Cocaine NONE DETECTED NONE DETECTED   Benzodiazepines NONE DETECTED NONE DETECTED   Amphetamines NONE DETECTED NONE DETECTED   Tetrahydrocannabinol  NONE DETECTED NONE DETECTED   Barbiturates NONE DETECTED NONE DETECTED  TSH  Result Value Ref Range   TSH 2.675 0.350 - 4.500 uIU/mL  D-dimer, quantitative (not at [facility])  Result Value Ref Range   D-Dimer, Quant 0.65 (H) 0.00 - 0.50 ug/mL-FEU  Hepatic function panel  Result Value Ref Range   Total Protein 8.0 6.5 - 8.1 g/dL   Albumin 4.3 3.5 - 5.0 g/dL   AST 85 (H) 15 - 41 U/L   ALT 52 14 - 54 U/L   Alkaline Phosphatase 76 38 - 126 U/L   Total Bilirubin 1.6 (H) 0.3 - 1.2 mg/dL   Bilirubin, Direct 0.2 0.1 - 0.5 mg/dL   Indirect Bilirubin 1.4 (H) 0.3 - 0.9 mg/dL  Troponin I  Result Value Ref Range   Troponin I <0.03 <0.03 ng/mL  I-Stat Beta hCG blood, ED ([facility], [facility], [facility] only)  Result Value Ref Range   I-stat hCG, quantitative <5.0 <5 mIU/mL   Comment 3           Dg Chest 2 View  Result Date:  02/19/2016 CLINICAL DATA:  Chest tightness, onset this morning. EXAM: CHEST  2 VIEW COMPARISON:  01/17/2016 FINDINGS: The lungs are clear. The pulmonary vasculature is normal. Heart size is normal. Hilar and mediastinal contours are unremarkable. There is no pleural effusion. IMPRESSION: No active cardiopulmonary disease. Electronically Signed   By: Ellery Plunkaniel R Mitchell M.D.   On: 02/19/2016 06:54   Ct Angio Chest Pe W And/or Wo Contrast  Result Date: 02/19/2016 CLINICAL DATA:  Left upper chest pain beginning today. Shortness of breath. EXAM: CT ANGIOGRAPHY CHEST WITH CONTRAST TECHNIQUE: Multidetector CT imaging of the chest was performed using the standard protocol during bolus administration of intravenous contrast. Multiplanar CT image reconstructions and MIPs were obtained to evaluate the vascular anatomy. CONTRAST:  65 cc of Isovue 370 COMPARISON:  Plain film of earlier today.  CT 01/17/2016. FINDINGS: Cardiovascular: The quality of this exam for evaluation of pulmonary embolism is good. No evidence of pulmonary embolism. Normal aortic caliber without dissection. Normal heart size, without pericardial effusion. Mediastinum/Nodes: Right-sided thyroid nodule is again identified and nonspecific at 9 mm. No mediastinal or hilar adenopathy. Lungs/Pleura: No pleural fluid.  Mild bibasilar atelectasis. Upper Abdomen: Hepatic steatosis. Normal imaged portions of the spleen, stomach. Musculoskeletal: Intraarticular loose bodies in the left glenohumeral joint. Review of the MIP images confirms the above findings. IMPRESSION: 1.  No evidence of pulmonary embolism. 2.  No acute process in the chest. 3. Hepatic steatosis. Electronically Signed   By: Jeronimo GreavesKyle  Talbot M.D.   On: 02/19/2016 11:31    Chest pain, unspecified type  Sinus tachycardia  Anxiety attack       Everlene FarrierWilliam Joani Cosma, PA-C 02/19/16 1258    Loren Raceravid Yelverton, MD 02/28/16 [phone]

## 2016-02-19 NOTE — ED Triage Notes (Signed)
Pt c/o anxiety attack and feeling like her heart is racing,

## 2016-02-19 NOTE — ED Notes (Signed)
[facility] and may be awhile til transfer. Lesly Dukesachel J Everett, RN

## 2016-02-19 NOTE — ED Notes (Signed)
Report given to [facility] 

## 2016-02-19 NOTE — Discharge Instructions (Signed)
Substance Abuse Treatment Programs ° °Intensive Outpatient Programs °[facility]     °[address]      °[place], [place]                   °[phone]      ° °[facility] °[address] °[place], [place] °336-379-7146 ° °[facility]     °(Inpatient and outpatient)     °[address]           °[phone]   ° °[facility] °336-288-1484 (Suboxone and Methadone) ° °[address]      °[place], [place] [address]      °336-882-2125      ° °[address] °[place], [place] °852-3033 ° °[facility] (Outpatient/Inpatient, Chemical)    °(insurance only) [phone]      °       °[facility] (Groups & Residential) °[place], [place] °336-389-1413 ° °   °[facility]     °[address]     °[place], [place]      °336-389-1413      ° °[facility] (for caregivers and family) °[address] °[place], [place] °336-299-4655 ° ° ° ° ° °Residential Treatment Programs °[facility]      °[address], [place], [place] [address]  °([phone]      ° °[facility] °[address] [place], [place] [address] °[phone] ° °[facility]        °336-248-8914      ° °[facility] °1-800-659-3381 ° °[facility]             °[address]                                         °[place], [place]                                                °877-615-2722 or 336-784-9470                              ° °[facility] [place] °[address] °[place], [address] °1.877.941.8954 ° °[facility]    °[address]      °[place], [place]     °[phone]      ° °[facility] °[address] °[place], [place] °336-285-9073 ° °[facility]   °[address]     °[place], [place] [address]     °[phone]      °Admissions: 8am-3pm M-F ° °[facility] °[address] °[place] °336-227-7417 ° °[facility]: Residential Program (90 Days)   °[place], [place]      °336-725-8389 or 800-758-6077    ° °[facility] °[place], [place] °(Walk in Hours over the weekend or by referral) ° °[facility] °[address], [place], [place] [address] °(336) [phone] ° °Crisis Mobile: [facility]:  1-877-626-1772 (for crisis response 24 hours a day) °[facility] Hotline:      1-800-256-2452 °Outpatient Psychiatry and Counseling ° °[facility]: Mobile Crisis   Management 24 hours:  1-877-626-1772 ° °[facility] sliding scale fee and walk in schedule: M-F 8am-12pm/1pm-3pm °[address]  °[place], [place] [address] °[phone] ° °[facility] °[address] °[place], [place] [address] °336-703-9650 ° °[facility] ([facility] known [facility])- new patient walk-in appointments available Monday - Friday 8am -3pm.          °[address] °[place], [place] [address] °336-676-6840 or crisis line- 336-676-6905 ° °[facility]/ Intensive Outpatient Therapy Program °[address] °[place], [place] [address] °336-832-9804 ° °[facility]                  °Crisis Services      °[phone]      °[address]     °[place], [place] [address]                ° °[facility]   °[facility] °[phone] °[address] °[place], [place] [address] ° ° °[facility]          °[address],  °[place], [place] [address]       °([phone] ° °[facility] °[address] °[place], [place] [address] °336-292-1510 ° °[facility]    °[address]    °[place], [place] [address]     °[phone]      ° °Parish McKinney, MD     °[address]     °[place] [address]     °336-282-1251     °  °[facility] °[address] °[place] [address] ° °[facility]     °[address]     °[place], [place]      °336-542-2076      ° °[facility] °Shamsher Ahluwalia, MD °[address] °[place], [place] [address] °[phone] ° °[facility]     °[address]     °[place], [place] [address]     °336-274-1237      ° °[facility] °[address] °[place], [place] [address] °336-854-4450 °Resources for Temporary Residential Assistance/Crisis Centers ° °DAY CENTERS °[facility] °M-F 8am-3pm   °[address] [place], [place] [address]   336-332-0824 °Services include: laundry, barbering, support groups, case management, phone  & computer access, showers, AA/NA mtgs, mental health/substance abuse nurse, job skills class, disability information, VA assistance, spiritual classes, etc.  ° °HOMELESS SHELTERS ° °[facility]     °[facility]   °[address], [place]     °336.271.5959       °       °[facility] (women and children)       °[address] °[place], [place] [address] °336-275-0820 °[email or web address] for application and process °Application Required ° °[facility]   °[address]    °[place] [address]     °[phone]       °             °[facility] °[address] °[place], [place] [address] °336.273.5572 °336-235-0363(schedule application appt.) °Application Required ° °[facility] (women only)    °[address]     °[place], [place] [address]     °336-884-1039      °  Intake starts 6pm daily °Need valid ID, SSC, & Police report °[facility] [place] °[address] °[place], [place] °336-881-5420 °Application Required ° °[facility] (men only)     °[address]      °[place], [place]     °[phone]      ° °[facility] °(Pregnant women only) °[address] °[place], [place] °336-275-0206 ° °[facility]      °[address]      °[place], [place] [address]     °[phone]      °       °[facility] °[address] °[place], [place] °336-723-1848 °90 day commitment/SA/Application process ° °[facility](men only)     °[address]     °[place], [place]     °[phone]       °Check-in at 7pm     °       °[facility] °[address] °[place], [place] [address] °336-248-6684 °Men/Women/Women and Children must be there by 7 pm ° °[facility] °[place], [place] °336-722-8721                ° °

## 2016-02-19 NOTE — ED Notes (Signed)
[facility] Charge RN by Doretha Sou Edwards RN

## 2016-02-19 NOTE — ED Provider Notes (Signed)
TIME SEEN: 6:15 AM  CHIEF COMPLAINT: Chest pain, shortness of breath, panic attack  HPI: Pt is a 40 y.o. female with history of hypertension, hypertriglyceridemia, hypothyroidism, alcohol abuse who presents to the emergency department with complaints of left-sided chest tightness, shortness of breath, palpitations occur heart is racing that started at 4 AM. She states that these are feeling that she normally gets with a panic attack. States she will cut by 4 AM after a nightmare "drenched in sweat and having a panic attack". States she felt nauseated and vomited which is also normal for her panic attacks. She denies history of CAD, PE or DVT. No family history of premature CAD. She denies any recent drug use. Reports last use was yesterday. Reports he drinks every other day. Has never had withdrawal seizures. States she normally doesn't have any withdrawal symptoms. No fevers, cough, diarrhea, bloody stools or melena..  No history of PE, DVT, exogenous estrogen use, fracture, surgery, trauma, hospitalization, prolonged travel. No lower extremity swelling or pain. No calf tenderness.  ROS: See HPI Constitutional: no fever  Eyes: no drainage  ENT: no runny nose   Cardiovascular:   chest pain  Resp: SOB  GI: no vomiting GU: no dysuria Integumentary: no rash  Allergy: no hives  Musculoskeletal: no leg swelling  Neurological: no slurred speech ROS otherwise negative  PAST MEDICAL HISTORY/PAST SURGICAL HISTORY:  Past Medical History:  Diagnosis Date  . Acid reflux   . Alcohol abuse 07/21/2012  . 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 Anxiety   . Asthma   . BV (bacterial vaginosis) 04/26/2015  . Colitis   . Dyspepsia 2004   Dx w/ PUD but no EGD, [place]   . Hiatal hernia   . History of alcohol abuse 04/26/2015  . History of colitis 11/29/2014  . Hypertension   . Hypertriglyceridemia   . Hypothyroid   . Iritis    frequent  . Irregular periods 01/11/2014  . Ovarian cyst   . Panic attacks    . RLQ abdominal pain 01/11/2014  . SVT (supraventricular tachycardia) (HCC)   . Thyroid nodule   . Trichomonal vaginitis 01/11/2014  . Vaginal discharge 01/11/2014  . Weight gain 01/11/2014    MEDICATIONS:  Prior to Admission medications   Medication Sig Start Date End Date Taking? Authorizing Provider  Carboxymethylcellul-Glycerin (REFRESH OPTIVE) 1-0.9 % GEL Apply 1 drop to eye daily as needed (dry eyes).    Historical Provider, MD  folic acid (FOLVITE) 1 MG tablet Take 1 tablet (1 mg total) by mouth daily. 11/23/15   Kathlen Mody, MD  guaiFENesin-dextromethorphan (ROBITUSSIN DM) 100-10 MG/5ML syrup Take 5 mLs by mouth every 4 (four) hours as needed for cough. 11/23/15   Kathlen Mody, MD  LORazepam (ATIVAN) 1 MG tablet Take 1 tablet (1 mg total) by mouth 3 (three) times daily as needed for anxiety. 11/23/15   Kathlen Mody, MD  metoprolol tartrate (LOPRESSOR) 25 MG tablet Take 12.5 mg by mouth as needed (for palpitations). Reported on 08/28/2015 08/13/12   Renae Fickle, MD  mometasone-formoterol [facility]) 100-5 MCG/ACT AERO Inhale 2 puffs into the lungs 2 (two) times daily. 11/23/15   Kathlen Mody, MD  Multiple Vitamins-Minerals (MULTIVITAMINS THER. W/MINERALS) TABS tablet Take 1 tablet by mouth daily.    Historical Provider, MD  pantoprazole (PROTONIX) 40 MG tablet Take 1 tablet (40 mg total) by mouth 2 (two) times daily before a meal. 11/23/15   Kathlen Mody, MD  sucralfate (CARAFATE) 1 g tablet Take 1 g by  mouth 4 (four) times daily.    Historical Provider, MD  Tetrahydrozoline-Zn Sulfate (ALLERGY RELIEF EYE DROPS OP) Apply 2 drops to eye daily.    Historical Provider, MD  thiamine 100 MG tablet Take 1 tablet (100 mg total) by mouth daily. 11/23/15   Kathlen ModyVijaya Akula, MD  traMADol (ULTRAM) 50 MG tablet Take 1 tablet (50 mg total) by mouth every 6 (six) hours as needed for severe pain. 11/23/15   Kathlen ModyVijaya Akula, MD    ALLERGIES:  Allergies  Allergen Reactions  . Penicillins Anaphylaxis    Has  patient had a PCN reaction causing immediate rash, facial/tongue/throat swelling, SOB or lightheadedness with hypotension: yes Has patient had a PCN reaction causing severe rash involving mucus membranes or skin necrosis: No Has patient had a PCN reaction that required hospitalization Yes Has patient had a PCN reaction occurring within the last 10 years: No If all of the above answers are "NO", then may proceed with Cephalosporin use.   . Lidocaine Other (See Comments)    hallucinations    SOCIAL HISTORY:  Social History  Substance Use Topics  . Smoking status: Never Smoker  . Smokeless tobacco: Never Used  . Alcohol use 0.0 oz/week     Comment: h/o etoh abuse, quit early 2017 but started back summer 2017.    FAMILY HISTORY: Family History  Problem Relation Age of Onset  . Stomach cancer Paternal Grandfather     colon cancer  . Cancer Paternal Grandfather     throat and esophagus  . Breast cancer Maternal Grandmother   . Cancer Maternal Grandmother     skin  . Anxiety disorder Maternal Grandmother   . Hypertension Mother   . Other Mother     fatty liver  . Hyperlipidemia Mother   . Liver disease Mother     fatty liver, does not drink.   . Other Father     varicose veins; stomach issues; hernia  . Hypertension Father   . Hyperlipidemia Father   . Cancer Father     prostate  . Arthritis Father     rheumatoid  . Thyroid disease Sister   . Other Brother     hernia  . Diabetes Maternal Grandfather   . Other Paternal Grandmother     hernia  . COPD Paternal Grandmother     EXAM: BP 143/98   Pulse (!) 131   Temp 98.2 F (36.8 C) (Oral)   Resp 26   Ht 5\' 4"  (1.626 m)   Wt 170 lb (77.1 kg)   LMP 02/02/2016   SpO2 98%   BMI 29.18 kg/m  CONSTITUTIONAL: Alert and oriented and responds appropriately to questions. Well-appearing; well-nourished, Appears anxious and uncomfortable HEAD: Normocephalic EYES: Conjunctivae clear, PERRL, EOMI ENT: normal nose; no  rhinorrhea; moist mucous membranes NECK: Supple, no meningismus, no nuchal rigidity, no LAD  CARD: Regular and tachycardic; S1 and S2 appreciated; no murmurs, no clicks, no rubs, no gallops CHEST:  Chest wall is nontender to palpation without crepitus, ecchymosis or deformity RESP: Normal chest excursion without splinting or tachypnea; breath sounds clear and equal bilaterally; no wheezes, no rhonchi, no rales, no hypoxia or respiratory distress, speaking full sentences ABD/GI: Normal bowel sounds; non-distended; soft, non-tender, no rebound, no guarding, no peritoneal signs, no hepatosplenomegaly BACK:  The back appears normal and is non-tender to palpation, there is no CVA tenderness EXT: Normal ROM in all joints; non-tender to palpation; no edema; normal capillary refill; no cyanosis, no calf tenderness or swelling  SKIN: Normal color for age and race; warm; no rash NEURO: Moves all extremities equally, sensation to light touch intact diffusely, cranial nerves II through XII intact, normal speech PSYCH: The patient's mood and manner are appropriate. Grooming and personal hygiene are appropriate.  MEDICAL DECISION MAKING: Patient here with complaints of chest pain, shortness of breath. She does describe that this feels similar to her prior panic attacks but does have some risk factors for ACS including hypertension and hypertriglyceridemia. No risk factors for pulmonary embolus but she is very tachycardic. We'll obtain cardiac labs, d-dimer, chest x-ray. We'll give IV fluids, Ativan and reassess.  ED PROGRESS: 7:35 AM  Pt's labs unremarkable other than positive d-dimer. She will need a CT of her chest rule out pulmonary embolus. She is not pregnant. Second troponin negative. Plan is to repeat a second troponin at 9:15 AM given she does have some risk factors for ACS but her heart score is 2 and if this is negative I feel she can be discharged with outpatient follow-up as long as her CT is negative.  At this time unfortunately our CT scanner is not functional. [facility] CT scan. She is comfortable with this plan. She will be transported by ambulance. Discussed with Dr[facility] Wilkie AyeHorton in [facility] who [facility] the patient. She is aware that she may be discharged from the emergency department. She states her family can come to pick her up. She reports feeling better after Ativan but tightness has not completely resolved. We'll give IV morphine.    I reviewed all nursing notes, vitals, pertinent old records, EKGs, labs, imaging (as available).   EKG Interpretation  Date/Time:  Wednesday February 19 2016 06:04:48 EST Ventricular Rate:  127 PR Interval:    QRS Duration: 78 QT Interval:  317 QTC Calculation: 461 R Axis:   30 Text Interpretation:  Sinus tachycardia No significant change since last tracing Confirmed by Daelin Haste,  DO, Alphonzo Devera [phone]) on 02/19/2016 6:12:51 AM          Layla MawKristen N Neveyah Garzon, DO 02/19/16 [id number]

## 2016-02-19 NOTE — ED Notes (Signed)
Pt is aware of [facility]. Lesly Dukesachel J Everett, RN

## 2016-02-19 NOTE — ED Notes (Signed)
Patient transported to CT 

## 2016-03-15 ENCOUNTER — Emergency Department (HOSPITAL_COMMUNITY)
Admission: EM | Admit: 2016-03-15 | Discharge: 2016-03-15 | Disposition: A | Discharge status: Home / Self Care | Payer: Self-pay | Attending: Emergency Medicine | Admitting: Emergency Medicine

## 2016-03-15 ENCOUNTER — Encounter (HOSPITAL_COMMUNITY): Payer: Self-pay | Admitting: *Deleted

## 2016-03-15 DIAGNOSIS — Z79899 Other long term (current) drug therapy: Secondary | ICD-10-CM | POA: Insufficient documentation

## 2016-03-15 DIAGNOSIS — F10930 Alcohol use, unspecified with withdrawal, uncomplicated: Secondary | ICD-10-CM

## 2016-03-15 DIAGNOSIS — J45909 Unspecified asthma, uncomplicated: Secondary | ICD-10-CM | POA: Insufficient documentation

## 2016-03-15 DIAGNOSIS — F10230 Alcohol dependence with withdrawal, uncomplicated: Secondary | ICD-10-CM | POA: Insufficient documentation

## 2016-03-15 DIAGNOSIS — I10 Essential (primary) hypertension: Secondary | ICD-10-CM | POA: Insufficient documentation

## 2016-03-15 DIAGNOSIS — E039 Hypothyroidism, unspecified: Secondary | ICD-10-CM | POA: Insufficient documentation

## 2016-03-15 LAB — CBC WITH DIFFERENTIAL/PLATELET
BASOS ABS: 0.1 10*3/uL (ref 0.0–0.1)
BASOS PCT: 1 %
EOS ABS: 0 10*3/uL (ref 0.0–0.7)
EOS PCT: 0 %
HCT: 40.9 % (ref 36.0–46.0)
Hemoglobin: 14.2 g/dL (ref 12.0–15.0)
Lymphocytes Relative: 9 %
Lymphs Abs: 0.7 10*3/uL (ref 0.7–4.0)
MCH: 36.8 pg — ABNORMAL HIGH (ref 26.0–34.0)
MCHC: 34.7 g/dL (ref 30.0–36.0)
MCV: 106 fL — ABNORMAL HIGH (ref 78.0–100.0)
MONO ABS: 0.4 10*3/uL (ref 0.1–1.0)
MONOS PCT: 5 %
Neutro Abs: 6.9 10*3/uL (ref 1.7–7.7)
Neutrophils Relative %: 85 %
PLATELETS: 130 10*3/uL — AB (ref 150–400)
RBC: 3.86 MIL/uL — ABNORMAL LOW (ref 3.87–5.11)
RDW: 13.3 % (ref 11.5–15.5)
WBC: 8.1 10*3/uL (ref 4.0–10.5)

## 2016-03-15 LAB — COMPREHENSIVE METABOLIC PANEL
ALBUMIN: 4.4 g/dL (ref 3.5–5.0)
ALK PHOS: 76 U/L (ref 38–126)
ALT: 67 U/L — ABNORMAL HIGH (ref 14–54)
AST: 155 U/L — AB (ref 15–41)
Anion gap: 12 (ref 5–15)
BILIRUBIN TOTAL: 2 mg/dL — AB (ref 0.3–1.2)
BUN: 9 mg/dL (ref 6–20)
CALCIUM: 9.1 mg/dL (ref 8.9–10.3)
CO2: 24 mmol/L (ref 22–32)
Chloride: 99 mmol/L — ABNORMAL LOW (ref 101–111)
Creatinine, Ser: 0.78 mg/dL (ref 0.44–1.00)
GFR calc Af Amer: >60 mL/min (ref >60)
GLUCOSE: 110 mg/dL — AB (ref 65–99)
POTASSIUM: 3.9 mmol/L (ref 3.5–5.1)
Sodium: 135 mmol/L (ref 135–145)
TOTAL PROTEIN: 8.1 g/dL (ref 6.5–8.1)

## 2016-03-15 LAB — RAPID URINE DRUG SCREEN, HOSP PERFORMED
Amphetamines: NOT DETECTED
BARBITURATES: NOT DETECTED
BENZODIAZEPINES: NOT DETECTED
COCAINE: NOT DETECTED
OPIATES: NOT DETECTED
Tetrahydrocannabinol: NOT DETECTED

## 2016-03-15 LAB — ETHANOL

## 2016-03-15 MED ORDER — LORAZEPAM 1 MG PO TABS
1.0000 mg | ORAL_TABLET | Freq: Once | ORAL | Status: AC
  Administered 2016-03-15: 1 mg via ORAL

## 2016-03-15 MED ORDER — LORAZEPAM 1 MG PO TABS: ORAL_TABLET | ORAL | 0 refills | Status: DC

## 2016-03-15 MED ORDER — VITAMIN B-1 100 MG PO TABS: 100.0000 mg | ORAL_TABLET | Freq: Every day | ORAL | Status: DC

## 2016-03-15 MED ORDER — LORAZEPAM 1 MG PO TABS
0.0000 mg | ORAL_TABLET | Freq: Four times a day (QID) | ORAL | Status: DC
  Administered 2016-03-15: 2 mg via ORAL
  Filled 2016-03-15: qty 2
  Filled 2016-03-15: qty 1

## 2016-03-15 MED ORDER — ALUM & MAG HYDROXIDE-SIMETH 200-200-20 MG/5ML PO SUSP: 30.0000 mL | ORAL | Status: DC | PRN

## 2016-03-15 MED ORDER — ONDANSETRON HCL 4 MG PO TABS: 4.0000 mg | ORAL_TABLET | Freq: Three times a day (TID) | ORAL | Status: DC | PRN

## 2016-03-15 MED ORDER — THIAMINE HCL 100 MG/ML IJ SOLN: 100.0000 mg | Freq: Every day | INTRAMUSCULAR | Status: DC

## 2016-03-15 MED ORDER — IBUPROFEN 400 MG PO TABS: 600.0000 mg | ORAL_TABLET | Freq: Three times a day (TID) | ORAL | Status: DC | PRN

## 2016-03-15 MED ORDER — ACETAMINOPHEN 325 MG PO TABS: 650.0000 mg | ORAL_TABLET | ORAL | Status: DC | PRN

## 2016-03-15 MED ORDER — LORAZEPAM 1 MG PO TABS: 0.0000 mg | ORAL_TABLET | Freq: Two times a day (BID) | ORAL | Status: DC

## 2016-03-15 NOTE — Discharge Instructions (Signed)
Take the ativan as prescribed. Return to the ED if you think you are not managing your withdrawal symptoms at home.  You should have someone stay with you until you get over the next 24-48 hours.

## 2016-03-15 NOTE — ED Triage Notes (Signed)
Pt states she thinks she is withdrawing from alcohol. Says last drink was Friday morning at 0800.
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
## 2016-05-05 ENCOUNTER — Encounter (HOSPITAL_COMMUNITY): Payer: Self-pay | Admitting: *Deleted

## 2016-05-05 ENCOUNTER — Emergency Department (HOSPITAL_COMMUNITY)
Admission: EM | Admit: 2016-05-05 | Discharge: 2016-05-05 | Disposition: A | Discharge status: Home / Self Care | Payer: Medicaid Other | Attending: Emergency Medicine | Admitting: Emergency Medicine

## 2016-05-05 DIAGNOSIS — R1084 Generalized abdominal pain: Secondary | ICD-10-CM

## 2016-05-05 DIAGNOSIS — F410 Panic disorder [episodic paroxysmal anxiety] without agoraphobia: Secondary | ICD-10-CM | POA: Diagnosis not present

## 2016-05-05 DIAGNOSIS — O211 Hyperemesis gravidarum with metabolic disturbance: Secondary | ICD-10-CM | POA: Insufficient documentation

## 2016-05-05 DIAGNOSIS — J45909 Unspecified asthma, uncomplicated: Secondary | ICD-10-CM | POA: Diagnosis not present

## 2016-05-05 DIAGNOSIS — Z3A00 Weeks of gestation of pregnancy not specified: Secondary | ICD-10-CM | POA: Insufficient documentation

## 2016-05-05 DIAGNOSIS — R1110 Vomiting, unspecified: Secondary | ICD-10-CM

## 2016-05-05 DIAGNOSIS — N9489 Other specified conditions associated with female genital organs and menstrual cycle: Secondary | ICD-10-CM | POA: Diagnosis not present

## 2016-05-05 DIAGNOSIS — F1010 Alcohol abuse, uncomplicated: Secondary | ICD-10-CM | POA: Diagnosis not present

## 2016-05-05 DIAGNOSIS — Z79899 Other long term (current) drug therapy: Secondary | ICD-10-CM | POA: Diagnosis not present

## 2016-05-05 DIAGNOSIS — E039 Hypothyroidism, unspecified: Secondary | ICD-10-CM | POA: Diagnosis not present

## 2016-05-05 DIAGNOSIS — R109 Unspecified abdominal pain: Secondary | ICD-10-CM | POA: Diagnosis not present

## 2016-05-05 DIAGNOSIS — R197 Diarrhea, unspecified: Secondary | ICD-10-CM

## 2016-05-05 DIAGNOSIS — E860 Dehydration: Secondary | ICD-10-CM

## 2016-05-05 DIAGNOSIS — I10 Essential (primary) hypertension: Secondary | ICD-10-CM | POA: Diagnosis not present

## 2016-05-05 DIAGNOSIS — O219 Vomiting of pregnancy, unspecified: Secondary | ICD-10-CM | POA: Diagnosis present

## 2016-05-05 DIAGNOSIS — O99310 Alcohol use complicating pregnancy, unspecified trimester: Secondary | ICD-10-CM | POA: Diagnosis not present

## 2016-05-05 DIAGNOSIS — O99340 Other mental disorders complicating pregnancy, unspecified trimester: Secondary | ICD-10-CM | POA: Diagnosis not present

## 2016-05-05 LAB — COMPREHENSIVE METABOLIC PANEL
ALBUMIN: 3.6 g/dL (ref 3.5–5.0)
ALT: 87 U/L — AB (ref 14–54)
AST: 157 U/L — AB (ref 15–41)
Alkaline Phosphatase: 85 U/L (ref 38–126)
Anion gap: 13 (ref 5–15)
BILIRUBIN TOTAL: 2 mg/dL — AB (ref 0.3–1.2)
BUN: 7 mg/dL (ref 6–20)
CHLORIDE: 101 mmol/L (ref 101–111)
CO2: 25 mmol/L (ref 22–32)
CREATININE: 0.78 mg/dL (ref 0.44–1.00)
Calcium: 9.1 mg/dL (ref 8.9–10.3)
GFR calc Af Amer: >60 mL/min (ref >60)
GFR calc non Af Amer: >60 mL/min (ref >60)
GLUCOSE: 90 mg/dL (ref 65–99)
POTASSIUM: 3.1 mmol/L — AB (ref 3.5–5.1)
Sodium: 139 mmol/L (ref 135–145)
Total Protein: 6.8 g/dL (ref 6.5–8.1)

## 2016-05-05 LAB — CBC WITH DIFFERENTIAL/PLATELET
Basophils Absolute: 0.1 10*3/uL (ref 0.0–0.1)
Basophils Relative: 1 %
EOS ABS: 0.1 10*3/uL (ref 0.0–0.7)
EOS PCT: 1 %
HCT: 45.1 % (ref 36.0–46.0)
Hemoglobin: 16.1 g/dL — ABNORMAL HIGH (ref 12.0–15.0)
LYMPHS ABS: 0.9 10*3/uL (ref 0.7–4.0)
LYMPHS PCT: 22 %
MCH: 37.9 pg — AB (ref 26.0–34.0)
MCHC: 35.7 g/dL (ref 30.0–36.0)
MCV: 106.1 fL — AB (ref 78.0–100.0)
Monocytes Absolute: 0.3 10*3/uL (ref 0.1–1.0)
Monocytes Relative: 8 %
Neutro Abs: 2.7 10*3/uL (ref 1.7–7.7)
Neutrophils Relative %: 68 %
PLATELETS: 150 10*3/uL (ref 150–400)
RBC: 4.25 MIL/uL (ref 3.87–5.11)
RDW: 12.6 % (ref 11.5–15.5)
WBC: 4 10*3/uL (ref 4.0–10.5)

## 2016-05-05 LAB — LIPASE, BLOOD: Lipase: 43 U/L (ref 11–51)

## 2016-05-05 LAB — HCG, QUANTITATIVE, PREGNANCY: hCG, Beta Chain, Quant, S: <1 m[IU]/mL (ref <5)

## 2016-05-05 LAB — ETHANOL: Alcohol, Ethyl (B): 10 mg/dL — ABNORMAL HIGH (ref <5)

## 2016-05-05 MED ORDER — LORAZEPAM 2 MG/ML IJ SOLN
2.0000 mg | Freq: Once | INTRAMUSCULAR | Status: AC
  Administered 2016-05-05: 2 mg via INTRAVENOUS
  Filled 2016-05-05: qty 1

## 2016-05-05 MED ORDER — SODIUM CHLORIDE 0.9 % IV BOLUS (SEPSIS)
1000.0000 mL | Freq: Once | INTRAVENOUS | Status: AC
  Administered 2016-05-05: 1000 mL via INTRAVENOUS

## 2016-05-05 MED ORDER — ONDANSETRON 4 MG PO TBDP: ORAL_TABLET | ORAL | 0 refills | Status: DC

## 2016-05-05 MED ORDER — CHLORDIAZEPOXIDE HCL 25 MG PO CAPS: ORAL_CAPSULE | ORAL | 0 refills | Status: DC

## 2016-05-05 MED ORDER — ONDANSETRON 8 MG PO TBDP
8.0000 mg | ORAL_TABLET | Freq: Once | ORAL | Status: AC
  Administered 2016-05-05: 8 mg via ORAL
  Filled 2016-05-05: qty 1

## 2016-05-05 NOTE — ED Notes (Addendum)
Dr. Jodi MourningZavitz notified that pt has no medication at home to help withdraw from alcohol.  Librium prescription given to pt along with zofran for nausea. Pt also given outpatient resources for alcohol withdrawal.

## 2016-05-05 NOTE — ED Provider Notes (Signed)
[facility] Provider Note   CSN: [id number] Arrival date & time: 05/05/16  0436     History   Chief Complaint Chief Complaint  Patient presents with  . Emesis    HPI Kara Stewart is a 41 y.o. female.  The history is provided by the patient.  Emesis   This is a new problem. The current episode started 2 days ago. The problem has been gradually worsening. The emesis has an appearance of stomach contents. There has been no fever. Associated symptoms include abdominal pain, chills and diarrhea. Pertinent negatives include no fever.  Pt with h/ol alcohol abuse who presents with vomiting/diarrhea that started 2 days ago No blood in vomit/stool She also reports abdominal pain as well No fever She is an alcoholic, last drink was 05/03/16 per patient She also reports panic attack that started tonight that is triggering chest pain(she has had this previously)   She reports that she was found to be pregnant recently in a clinic No vaginal bleeding but she reports previous irregular menstrual cycle   No recent travel Unclear cause of her vomiting/diarrhea  Past Medical History:  Diagnosis Date  . Acid reflux   . Alcohol abuse 07/21/2012  . 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 Anxiety   . Asthma   . BV (bacterial vaginosis) 04/26/2015  . Colitis   . Dyspepsia 2004   Dx w/ PUD but no EGD, [place]   . Hiatal hernia   . History of alcohol abuse 04/26/2015  . History of colitis 11/29/2014  . Hypertension   . Hypertriglyceridemia   . Hypothyroid   . Iritis    frequent  . Irregular periods 01/11/2014  . Ovarian cyst   . Panic attacks   . RLQ abdominal pain 01/11/2014  . SVT (supraventricular tachycardia) (HCC)   . Thyroid nodule   . Trichomonal vaginitis 01/11/2014  . Vaginal discharge 01/11/2014  . Weight gain 01/11/2014    Patient Active Problem List   Diagnosis Date Noted  . Mallory-Weiss tear   . Hematemesis 11/21/2015  . Hematemesis without nausea     . Esophageal dysphagia   . Alcoholic hepatitis without ascites   . BV (bacterial vaginosis) 04/26/2015  . History of alcohol abuse 04/26/2015  . Alcohol withdrawal (HCC) 01/20/2015  . Marijuana abuse 01/20/2015  . Abnormal CT scan, colon 01/08/2015  . Ovarian cyst 11/29/2014  . Anxiety 11/29/2014  . History of colitis 11/29/2014  . ETOH abuse 11/20/2014  . RUQ pain 08/27/2014  . Abdominal pain 08/07/2014  . Dysphagia, idiopathic 03/05/2014  . Thyroid nodule 01/11/2014  . RLQ abdominal pain 01/11/2014  . Vaginal discharge 01/11/2014  . Trichomonal vaginitis 01/11/2014  . Weight gain 01/11/2014  . Irregular periods 01/11/2014  . SVT (supraventricular tachycardia) (HCC) 09/07/2012  . Obesity (BMI 30-39.9) 09/07/2012  . EBV infection 08/11/2012  . Orthostatic hypotension 08/08/2012  . GERD (gastroesophageal reflux disease) 07/21/2012  . Alcohol abuse 07/21/2012  . Diarrhea 07/21/2012  . Upper abdominal pain 10/28/2011  . Elevated LFTs- negative HIDA 10/28/2011  . Fatty liver 10/28/2011    Past Surgical History:  Procedure Laterality Date  . BIOPSY N/A 02/05/2015   Procedure: BIOPSY;  Surgeon: West Bali, MD;  Location: [facility];  Service: Endoscopy;  Laterality: N/A;  . COLONOSCOPY WITH PROPOFOL N/A 02/05/2015   WGN:FAOZH HH/mild diverticulosis  . ESOPHAGEAL DILATION N/A 11/21/2015   Procedure: ESOPHAGEAL DILATION;  Surgeon: Corbin Ade, MD;  Location: [facility];  Service: Endoscopy;  Laterality: N/A;  .  ESOPHAGOGASTRODUODENOSCOPY  11/06/2011   [name]: MILD Esophagitis/PATENT ESOPHAGEAL Stricture/  Moderate gastritis. Bx no.hpylori or celiac, +gastritis  . ESOPHAGOGASTRODUODENOSCOPY (EGD) WITH PROPOFOL N/A 03/20/2014   [name]: 1. Mild esophagitis & distal esophagela stricture. 2. small hiatal hernia 3. moderate non-erosive gastritis and mild duodenits  . ESOPHAGOGASTRODUODENOSCOPY (EGD) WITH PROPOFOL N/A 11/21/2015   Procedure: ESOPHAGOGASTRODUODENOSCOPY (EGD) WITH PROPOFOL;   Surgeon: Corbin Ade, MD;  Location: [facility];  Service: Endoscopy;  Laterality: N/A;  . SAVORY DILATION N/A 03/20/2014   Procedure: SAVORY DILATION;  Surgeon: West Bali, MD;  Location: [facility];  Service: Endoscopy;  Laterality: N/A;  dilated with # 12.8, 14,15,16  . TOOTH EXTRACTION      OB History    Gravida Para Term Preterm AB Living   2       1     SAB TAB Ectopic Multiple Live Births     1             Home Medications    Prior to Admission medications   Medication Sig Start Date End Date Taking? Authorizing Provider  Carboxymethylcellul-Glycerin (REFRESH OPTIVE) 1-0.9 % GEL Apply 1 drop to eye daily as needed (dry eyes).    Historical Provider, MD  folic acid (FOLVITE) 1 MG tablet Take 1 tablet (1 mg total) by mouth daily. 11/23/15   Kathlen Mody, MD  guaiFENesin-dextromethorphan (ROBITUSSIN DM) 100-10 MG/5ML syrup Take 5 mLs by mouth every 4 (four) hours as needed for cough. 11/23/15   Kathlen Mody, MD  hydrOXYzine (ATARAX/VISTARIL) 10 MG tablet Take 1 tablet (10 mg total) by mouth every 6 (six) hours as needed for anxiety. 02/19/16   Everlene Farrier, PA-C  LORazepam (ATIVAN) 1 MG tablet Take 1 or 2 po Q 6hrs for withdrawal symptoms 03/15/16   Devoria Albe, MD  metoprolol tartrate (LOPRESSOR) 25 MG tablet Take 12.5 mg by mouth as needed (for palpitations). Reported on 08/28/2015 08/13/12   Renae Fickle, MD  mometasone-formoterol [facility]) 100-5 MCG/ACT AERO Inhale 2 puffs into the lungs 2 (two) times daily. 11/23/15   Kathlen Mody, MD  Multiple Vitamins-Minerals (MULTIVITAMINS THER. W/MINERALS) TABS tablet Take 1 tablet by mouth daily.    Historical Provider, MD  pantoprazole (PROTONIX) 40 MG tablet Take 1 tablet (40 mg total) by mouth 2 (two) times daily before a meal. 11/23/15   Kathlen Mody, MD  sucralfate (CARAFATE) 1 g tablet Take 1 g by mouth 4 (four) times daily.    Historical Provider, MD  Tetrahydrozoline-Zn Sulfate (ALLERGY RELIEF EYE DROPS OP) Apply 2 drops to eye  daily.    Historical Provider, MD  thiamine 100 MG tablet Take 1 tablet (100 mg total) by mouth daily. 11/23/15   Kathlen Mody, MD  traMADol (ULTRAM) 50 MG tablet Take 1 tablet (50 mg total) by mouth every 6 (six) hours as needed for severe pain. 11/23/15   Kathlen Mody, MD    Family History Family History  Problem Relation Age of Onset  . Stomach cancer Paternal Grandfather     colon cancer  . Cancer Paternal Grandfather     throat and esophagus  . Breast cancer Maternal Grandmother   . Cancer Maternal Grandmother     skin  . Anxiety disorder Maternal Grandmother   . Hypertension Mother   . Other Mother     fatty liver  . Hyperlipidemia Mother   . Liver disease Mother     fatty liver, does not drink.   . Other Father  varicose veins; stomach issues; hernia  . Hypertension Father   . Hyperlipidemia Father   . Cancer Father     prostate  . Arthritis Father     rheumatoid  . Thyroid disease Sister   . Other Brother     hernia  . Diabetes Maternal Grandfather   . Other Paternal Grandmother     hernia  . COPD Paternal Grandmother     Social History Social History  Substance Use Topics  . Smoking status: Never Smoker  . Smokeless tobacco: Never Used  . Alcohol use 0.0 oz/week     Comment: h/o etoh abuse, quit early 2017 but started back summer 2017.     Allergies   Penicillins and Lidocaine   Review of Systems Review of Systems  Constitutional: Positive for chills. Negative for fever.  Cardiovascular: Positive for chest pain.  Gastrointestinal: Positive for abdominal pain, diarrhea and vomiting. Negative for blood in stool.  Psychiatric/Behavioral: The patient is nervous/anxious.   All other systems reviewed and are negative.    Physical Exam Updated Vital Signs BP 130/93 (BP Location: Left Arm)   Pulse 102   Temp 98.2 F (36.8 C) (Oral)   Resp 20   Ht 5' 4.5" (1.638 m)   Wt 76.2 kg   SpO2 97%   BMI 28.39 kg/m   Physical Exam CONSTITUTIONAL:  Disheveled, anxious HEAD: Normocephalic/atraumatic EYES: EOMI/PERRL ENMT: Mucous membranes moist NECK: supple no meningeal signs SPINE/BACK:entire spine nontender CV: S1/S2 noted, no murmurs/rubs/gallops noted LUNGS: Lungs are clear to auscultation bilaterally, no apparent distress ABDOMEN: soft, mild diffuse tenderness, no rebound or guarding, bowel sounds noted throughout abdomen GU:no cva tenderness NEURO: Pt is awake/alert/appropriate, moves all extremitiesx4.  No facial droop. Mild tremor noted  EXTREMITIES: pulses normal/equal, full ROM SKIN: warm, color normal PSYCH:anxious  ED Treatments / Results  Labs (all labs ordered are listed, but only abnormal results are displayed) Labs Reviewed  COMPREHENSIVE METABOLIC PANEL - Abnormal; Notable for the following:       Result Value   Potassium 3.1 (*)    AST 157 (*)    ALT 87 (*)    Total Bilirubin 2.0 (*)    All other components within normal limits  CBC WITH DIFFERENTIAL/PLATELET - Abnormal; Notable for the following:    Hemoglobin 16.1 (*)    MCV 106.1 (*)    MCH 37.9 (*)    All other components within normal limits  ETHANOL - Abnormal; Notable for the following:    Alcohol, Ethyl (B) 10 (*)    All other components within normal limits  LIPASE, BLOOD  HCG, QUANTITATIVE, PREGNANCY  URINALYSIS, ROUTINE W REFLEX MICROSCOPIC    EKG  EKG Interpretation  Date/Time:  Tuesday May 05 2016 06:21:53 EST Ventricular Rate:  100 PR Interval:    QRS Duration: 92 QT Interval:  368 QTC Calculation: 475 R Axis:   24 Text Interpretation:  Sinus tachycardia Abnormal R-wave progression, early transition Borderline T abnormalities, diffuse leads No significant change since last tracing Confirmed by Bebe ShaggyWICKLINE  MD, Palin Tristan ([id number]) on 05/05/2016 6:27:14 AM       Radiology No results found.  Procedures Procedures (including critical care time)  Medications Ordered in ED Medications  ondansetron (ZOFRAN-ODT) disintegrating  tablet 8 mg (8 mg Oral Given 05/05/16 0508)  LORazepam (ATIVAN) injection 2 mg (2 mg Intravenous Given 05/05/16 0629)  sodium chloride 0.9 % bolus 1,000 mL (1,000 mLs Intravenous New Bag/Given 05/05/16 0629)     Initial Impression / Assessment and  Plan / ED Course  I have reviewed the triage vital signs and the nursing notes.  Pertinent labs  results that were available during my care of the patient were reviewed by me and considered in my medical decision making (see chart for details).     7:04 AM Pt here for vomiting/diarrhea and also reporting anxiety/chest pain She is now improved with IV fluids/ativan Tremor resolved She is not in distress No signs of significant withdrawal at this time   Today, her HCG is negative In our system she had negative HCG 2 months ago She has no vag bleeding I suspect she had false positive while at her PCP recently She has OBGYN f/u in 2 days Pt feels comfortable for d/c home She is taking PO fluids  As for panic attack/CP - she has had this previously, EKG unchanged  defer further workup  Final Clinical Impressions(s) / ED Diagnoses   Final diagnoses:  Vomiting and diarrhea  Dehydration  Alcohol abuse  Generalized abdominal pain  Panic attack    New Prescriptions New Prescriptions   No medications on file     Zadie Rhine, MD 05/05/16 0710

## 2016-05-05 NOTE — ED Notes (Signed)
Pt told to wait in waiting room until 1030 and will be supervised by security.  Pt verbalizes understanding not to drive until [id number].

## 2016-05-05 NOTE — ED Triage Notes (Signed)
Pt c/o n/v/d x 2 days and states she feels dizzy and shaky; pt states she is unable to take anything due to being pregnant;

## 2016-05-05 NOTE — Discharge Instructions (Signed)

## 2016-05-05 NOTE — ED Notes (Signed)
Pt made aware to return if symptoms worsen or if any life threatening symptoms occur.  Pt informed not to drive self home, is trying to find a ride currently.  Pt verbalizes understanding of d/c instructions.

## 2016-05-10 DIAGNOSIS — R079 Chest pain, unspecified: Secondary | ICD-10-CM | POA: Diagnosis not present

## 2016-08-13 ENCOUNTER — Other Ambulatory Visit: Payer: Self-pay | Admitting: Gastroenterology

## 2016-08-13 NOTE — Telephone Encounter (Signed)
LMOM for a return call.  

## 2016-08-13 NOTE — Telephone Encounter (Signed)
PT called and was informed. She said that Dr. Darrick PennaFields is the one that put her on it when she was in the hospital.  She gave her refills and she could not get them because she could not afford them. Now she has Medicaid, but the refills had expired. Routing to Dr. Darrick PennaFields for recommendation. PT does not have a PCP.

## 2016-08-13 NOTE — Telephone Encounter (Signed)
Please call the patient. This medication was Rx'd by hospitalist when she was discharged in 11/2015 and is generally a COPD medication. She had called to ask about it in the last year (see phone note) and we tried to get more information but her VM box was full.

## 2016-08-14 MED ORDER — MOMETASONE FURO-FORMOTEROL FUM 100-5 MCG/ACT IN AERO: 2.0000 | INHALATION_SPRAY | Freq: Two times a day (BID) | RESPIRATORY_TRACT | 3 refills | Status: DC

## 2016-08-14 NOTE — Telephone Encounter (Signed)
PLEASE CALL PT.  Rx sent.  

## 2016-08-14 NOTE — Addendum Note (Signed)
Addended by: West BaliFIELDS, Ysidro Ramsay L on: 08/14/2016 06:38 PM   Modules accepted: Orders

## 2016-08-17 NOTE — Telephone Encounter (Signed)
LMOM the Rx has been sent in.

## 2016-09-07 DIAGNOSIS — E876 Hypokalemia: Secondary | ICD-10-CM | POA: Diagnosis not present

## 2016-09-07 DIAGNOSIS — K760 Fatty (change of) liver, not elsewhere classified: Secondary | ICD-10-CM | POA: Diagnosis not present

## 2016-09-07 DIAGNOSIS — R1031 Right lower quadrant pain: Secondary | ICD-10-CM | POA: Diagnosis not present

## 2016-09-07 DIAGNOSIS — N3000 Acute cystitis without hematuria: Secondary | ICD-10-CM | POA: Diagnosis not present

## 2016-09-09 ENCOUNTER — Encounter: Payer: Self-pay | Admitting: Internal Medicine

## 2016-09-21 ENCOUNTER — Ambulatory Visit: Payer: Medicaid Other | Admitting: Gastroenterology

## 2016-10-05 ENCOUNTER — Other Ambulatory Visit (HOSPITAL_COMMUNITY)
Admission: RE | Admit: 2016-10-05 | Discharge: 2016-10-05 | Disposition: A | Discharge status: Home / Self Care | Payer: Medicaid Other | Source: Ambulatory Visit | Attending: Gastroenterology | Admitting: Gastroenterology

## 2016-10-05 ENCOUNTER — Other Ambulatory Visit: Payer: Self-pay

## 2016-10-05 ENCOUNTER — Ambulatory Visit (INDEPENDENT_AMBULATORY_CARE_PROVIDER_SITE_OTHER): Payer: Medicaid Other | Admitting: Gastroenterology

## 2016-10-05 ENCOUNTER — Telehealth: Payer: Self-pay | Admitting: Gastroenterology

## 2016-10-05 ENCOUNTER — Telehealth: Payer: Self-pay

## 2016-10-05 ENCOUNTER — Encounter: Payer: Self-pay | Admitting: Gastroenterology

## 2016-10-05 DIAGNOSIS — K7011 Alcoholic hepatitis with ascites: Secondary | ICD-10-CM

## 2016-10-05 HISTORY — DX: Alcoholic hepatitis with ascites: K70.11

## 2016-10-05 LAB — BASIC METABOLIC PANEL
Anion gap: 11 (ref 5–15)
CHLORIDE: 99 mmol/L — AB (ref 101–111)
CO2: 25 mmol/L (ref 22–32)
CREATININE: 0.82 mg/dL (ref 0.44–1.00)
Calcium: 8.4 mg/dL — ABNORMAL LOW (ref 8.9–10.3)
GFR calc Af Amer: >60 mL/min (ref >60)
GFR calc non Af Amer: >60 mL/min (ref >60)
Glucose, Bld: 95 mg/dL (ref 65–99)
Potassium: 3.4 mmol/L — ABNORMAL LOW (ref 3.5–5.1)
Sodium: 135 mmol/L (ref 135–145)

## 2016-10-05 LAB — CBC WITH DIFFERENTIAL/PLATELET
BASOS ABS: 0 10*3/uL (ref 0.0–0.1)
Basophils Relative: 0 %
Eosinophils Absolute: 0.1 10*3/uL (ref 0.0–0.7)
Eosinophils Relative: 1 %
HCT: 40.2 % (ref 36.0–46.0)
HEMOGLOBIN: 13 g/dL (ref 12.0–15.0)
LYMPHS PCT: 9 %
Lymphs Abs: 1 10*3/uL (ref 0.7–4.0)
MCH: 34.7 pg — ABNORMAL HIGH (ref 26.0–34.0)
MCHC: 32.3 g/dL (ref 30.0–36.0)
MCV: 107.2 fL — AB (ref 78.0–100.0)
Monocytes Absolute: 0.6 10*3/uL (ref 0.1–1.0)
Monocytes Relative: 6 %
NEUTROS ABS: 9.3 10*3/uL — AB (ref 1.7–7.7)
NEUTROS PCT: 84 %
PLATELETS: 283 10*3/uL (ref 150–400)
RBC: 3.75 MIL/uL — AB (ref 3.87–5.11)
RDW: 14.1 % (ref 11.5–15.5)
WBC: 11 10*3/uL — ABNORMAL HIGH (ref 4.0–10.5)

## 2016-10-05 LAB — HEPATIC FUNCTION PANEL
ALBUMIN: 2 g/dL — AB (ref 3.5–5.0)
ALK PHOS: 149 U/L — AB (ref 38–126)
ALT: 37 U/L (ref 14–54)
AST: 218 U/L — AB (ref 15–41)
BILIRUBIN TOTAL: 17.1 mg/dL — AB (ref 0.3–1.2)
Bilirubin, Direct: 9.2 mg/dL — ABNORMAL HIGH (ref 0.1–0.5)
Indirect Bilirubin: 7.9 mg/dL — ABNORMAL HIGH (ref 0.3–0.9)
Total Protein: 6.6 g/dL (ref 6.5–8.1)

## 2016-10-05 LAB — PROTIME-INR
INR: 1.51
Prothrombin Time: 18.4 seconds — ABNORMAL HIGH (ref 11.4–15.2)

## 2016-10-05 LAB — HCG, QUANTITATIVE, PREGNANCY: hCG, Beta Chain, Quant, S: 1 m[IU]/mL (ref <5)

## 2016-10-05 NOTE — Progress Notes (Signed)
No pcp per patient 

## 2016-10-05 NOTE — Telephone Encounter (Signed)
Please call [facility] labs and add-on B-HCG quantitative test to today's labs. Need that result TODAY, if you need to order stat to get it today, then please do.

## 2016-10-05 NOTE — Patient Instructions (Signed)
1. Please go to [facility] for labs now.  2. Abdominal tap to draw fluid off your abdomen as scheduled. 3. Limit your daily sodium (salt) intake to 2000 mg. 4. Do not consume any more alcohol. Please consider your resources for ongoing sobriety. Consider [facility] 5[facility]30 [facility] [place].

## 2016-10-05 NOTE — Telephone Encounter (Signed)
I called Hospital lab and added the test, and faxed over the order for STAT.

## 2016-10-05 NOTE — Progress Notes (Signed)
Primary Care Physician: Patient, No Pcp Per  Primary Gastroenterologist:  Jonette EvaSandi Fields, MD   Chief Complaint  Patient presents with  . Abdominal Pain    "all over"  . abdominal swelling    "tight", worse since got out of hospital (beginning of June)-hypokalemia, alocohol hepatitis    HPI: Kara Stewart is a 41 y.o. [facility] Patient states she was told she had alcoholic hepatitis and needed to be seen. Patient was last seen by her practice during her hospitalization August 2017 for hematemesis. She was found to have a Mallory-Weiss tear, LA grade B esophagitis.   Patient has known history of alcohol abuse, prior history of EtOH hepatitis. When we saw her in August she had been sober about 6 months but slipped up. In May 2017 there was no indication of chronic liver disease. Patient was seen in the ED back in December 2017 for alcohol withdrawal. Seen again in the ED in January 2018 with nausea and vomiting and elevated alcohol level at that time.  Patient tells me she's had 3 miscarriages this year. She states that she had been sober since February when she found out she was pregnant but after she had her second miscarriage she went on an alcohol binge for about a week. She states she's been excited about pregnancy because she doesn't have any children. She is not handled the miscarriages very well emotionally. Early May she states she cannot she is pregnant again but on follow-up ultrasounds the fetus is viable.  Patient was admitted a couple of days earlier this month with severe hypokalemia of 2.7, hypotension and tachycardia per patient. During the admission she had a CT abdomen pelvis on June 4 with contrast for right lower quadrant pain, fever, recent miscarriage with negative urinary pregnancy test. She had new mild ascites. Stable hepatomegaly and severe hepatic steatosis, stable mild splenomegaly.   Initially her total  bilirubin was 5.3, alkaline phosphatase 192, AST 123, ALT 40, INR 1.4. Lipase 30.3. At time of discharge her total bilirubin was 3.6, alkaline phosphatase 157, AST 113, ALT 34, albumin 2.4. Bilirubin was not differentiated. Her creatinine was 0.48. White blood cell count 2800, hemoglobin 10.5, hematocrit 31.5, MCV 122.6, platelets 149,000. ANA negative, smooth muscle antibody negative. Hepatitis A IgM negative. Hepatitis B surface antigen negative. Hepatitis B core IgM negative. Hepatitis C antibody negative. Ceruloplasmin normal at 20.7.  Patient was not treated with prednisolone based on DF during her admission.   Of note, patient was provided her initial appointment on June 18 but she had to cancel due to lack of transportation.   Patient denies alcohol use since she's been on the hospital. No Tylenol use. She's noted increasing abdominal distention as well as jaundice since she was discharged. She is having to use her inhaler much more frequently. She has early satiety. She was advised to use TED hose for lower extremity edema which is been developing as well. She's never had issues with fluid/edema in the past. Bowel movement several times per day, no diarrhea. No melena rectal bleeding. She's having a lot of pressure in her abdomen from the distention and otherwise really denies abdominal pain.  She states she took another pregnancy test last week which was negative.  She reports she is feeling better now than she did when she was in the hospital. She reports jaundice dating back to early May. Dietary recall, consuming a lot of sodium including pickles, canned soups, luncheon  meat.  Current Outpatient Prescriptions  Medication Sig Dispense Refill  . diphenhydrAMINE (BENADRYL) 25 mg capsule Take 25 mg by mouth every 6 (six) hours as needed.    Marland Kitchen ibuprofen (ADVIL,MOTRIN) 200 MG tablet Take 600 mg by mouth every 6 (six) hours as needed.    . metoprolol tartrate (LOPRESSOR) 25 MG tablet Take 12.5  mg by mouth as needed (for palpitations). Reported on 08/28/2015    . MILK THISTLE PO Take by mouth 3 (three) times daily.    . mometasone-formoterol (DULERA) 100-5 MCG/ACT AERO Inhale 2 puffs into the lungs 2 (two) times daily. 13 g 3  . Multiple Vitamin (MULTIVITAMIN) tablet Take 1 tablet by mouth daily.    Jeananne Rama Sulfate (ALLERGY RELIEF EYE DROPS OP) Apply 2 drops to eye daily.    Marland Kitchen thiamine 100 MG tablet Take 1 tablet (100 mg total) by mouth daily. 30 tablet 0   No current facility-administered medications for this visit.     Allergies as of 10/05/2016 - Review Complete 10/05/2016  Allergen Reaction Noted  . Penicillins Anaphylaxis 10/18/2011  . Lidocaine Other (See Comments) 06/22/2014   Past Medical History:  Diagnosis Date  . Acid reflux   . Alcohol abuse 07/21/2012  . 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 Anxiety   . Asthma   . BV (bacterial vaginosis) 04/26/2015  . Colitis   . Dyspepsia 2004   Dx w/ PUD but no EGD, [place]   . Hiatal hernia   . History of alcohol abuse 04/26/2015  . History of colitis 11/29/2014  . Hypertension   . Hypertriglyceridemia   . Hypothyroid   . Iritis    frequent  . Irregular periods 01/11/2014  . Ovarian cyst   . Panic attacks   . RLQ abdominal pain 01/11/2014  . SVT (supraventricular tachycardia) (HCC)   . Thyroid nodule   . Trichomonal vaginitis 01/11/2014  . Vaginal discharge 01/11/2014  . Weight gain 01/11/2014   Past Surgical History:  Procedure Laterality Date  . BIOPSY N/A 02/05/2015   Procedure: BIOPSY;  Surgeon: West Bali, MD;  Location: [facility];  Service: Endoscopy;  Laterality: N/A;  . COLONOSCOPY WITH PROPOFOL N/A 02/05/2015   PNT:IRWER HH/mild diverticulosis  . ESOPHAGEAL DILATION N/A 11/21/2015   Procedure: ESOPHAGEAL DILATION;  Surgeon: Corbin Ade, MD;  Location: [facility];  Service: Endoscopy;  Laterality: N/A;  . ESOPHAGOGASTRODUODENOSCOPY  11/06/2011   [name]: MILD Esophagitis/PATENT ESOPHAGEAL  Stricture/  Moderate gastritis. Bx no.hpylori or celiac, +gastritis  . ESOPHAGOGASTRODUODENOSCOPY (EGD) WITH PROPOFOL N/A 03/20/2014   [name]: 1. Mild esophagitis & distal esophagela stricture. 2. small hiatal hernia 3. moderate non-erosive gastritis and mild duodenits  . ESOPHAGOGASTRODUODENOSCOPY (EGD) WITH PROPOFOL N/A 11/21/2015   Procedure: ESOPHAGOGASTRODUODENOSCOPY (EGD) WITH PROPOFOL;  Surgeon: Corbin Ade, MD;  [facility];  Service: Endoscopy;  Laterality: N/A;  . SAVORY DILATION N/A 03/20/2014   Procedure: SAVORY DILATION;  Surgeon: West Bali, MD;  Location: [facility];  Service: Endoscopy;  Laterality: N/A;  dilated with # 12.8, 14,15,16  . TOOTH EXTRACTION      ROS:  General: Negative for anorexia, weight loss, fever, chills, fatigue, weakness. ENT: Negative for hoarseness, difficulty swallowing , nasal congestion. CV: Negative for chest pain, angina, palpitations, dyspnea on exertion, peripheral edema.  Respiratory: Negative for dyspnea at rest, dyspnea on exertion, sputum, wheezing. +cough, sneezing GI: See history of present illness. GU:  Negative for dysuria, hematuria, urinary incontinence, urinary frequency, nocturnal urination.  Endo: Negative  for unusual weight change.    Physical Examination:   BP 115/71   Pulse (!) 120   Temp (!) 96.8 F (36 C) (Oral)   Ht 5' 4.5" (1.638 m)   Wt 172 lb 12.8 oz (78.4 kg)   LMP 08/04/2016 (Approximate)   Breastfeeding? Unknown Comment: bleeding twice/month  BMI 29.20 kg/m   General: jaundiced, chronically ill-appearing. no acute distress.  Eyes: ++ icterus. Mouth: Oropharyngeal mucosa moist and pink , no lesions erythema or exudate. Lungs: Clear to auscultation bilaterally.  Chest: multiple telangiectasias throughout the upper chest and upper arms Heart: Regular  rhythm, no murmurs rubs or gallops. tachyardia Abdomen: Bowel sounds are normal, moderately distended/tense with positive fluid wave.  no rebound or  guarding.  Extremities: 1-2+ lower extremity edema. No clubbing or deformities. Neuro: Alert and oriented x 4   Skin: Warm and dry, no jaundice.   Psych: Alert and cooperative, normal mood and affect.  Labs:  As outlined above  Imaging Studies: No results found.

## 2016-10-05 NOTE — Assessment & Plan Note (Signed)
41 year old female with known alcohol abuse, prior alcoholic hepatitis who presents with jaundice, ascites. Patient gives report of long-term sobriety until recently when on an alcohol binge for 1 week after her second miscarriage in early May. She reports 3 pregnancies with ultimate miscarriages since February of this year. As noted above she has been seen in the ED late last year with alcohol withdrawal symptoms. She had positive alcohol level in January 2018. She reports a prior to since February when she initially found out she was pregnant the first time.  Most recent abdominal imaging last month revealed severe hepatic steatosis, hepatosplenomegaly. Suspect alcohol hepatitis possibly superimposed on chronic liver disease. Urged patient to avoid all alcohol. She has been provided resources in the recent past. She is aware if she doesn't stop drinking that she's will die. We will update labs today. Plan for abdominal paracenteses tomorrow with fluid analysis. Discussed 2 g sodium diet, importance for fluid management. Further recommendations to follow.

## 2016-10-05 NOTE — Telephone Encounter (Signed)
Received a message from Tana CoastLeslie Lewis, PA to tell pt that her potassium was just slightly low and we will call her once all labs are back. She might be started on Prednisone. Tap tomorrow as planned.  Pt is aware.

## 2016-10-06 ENCOUNTER — Ambulatory Visit (HOSPITAL_COMMUNITY)
Admission: RE | Admit: 2016-10-06 | Discharge: 2016-10-06 | Disposition: A | Discharge status: Home / Self Care | Payer: Medicaid Other | Source: Ambulatory Visit | Attending: Gastroenterology | Admitting: Gastroenterology

## 2016-10-06 ENCOUNTER — Telehealth: Payer: Self-pay | Admitting: Gastroenterology

## 2016-10-06 ENCOUNTER — Other Ambulatory Visit: Payer: Self-pay

## 2016-10-06 ENCOUNTER — Other Ambulatory Visit: Payer: Self-pay | Admitting: Gastroenterology

## 2016-10-06 ENCOUNTER — Telehealth: Payer: Self-pay

## 2016-10-06 ENCOUNTER — Encounter (HOSPITAL_COMMUNITY): Payer: Self-pay

## 2016-10-06 DIAGNOSIS — K7011 Alcoholic hepatitis with ascites: Secondary | ICD-10-CM | POA: Insufficient documentation

## 2016-10-06 LAB — BODY FLUID CELL COUNT WITH DIFFERENTIAL
Eos, Fluid: 0 %
LYMPHS FL: 32 %
Monocyte-Macrophage-Serous Fluid: 68 % (ref 50–90)
NEUTROPHIL FLUID: 0 % (ref 0–25)
Other Cells, Fluid: 0 %
Total Nucleated Cell Count, Fluid: 113 cu mm (ref 0–1000)

## 2016-10-06 LAB — HEPATITIS PANEL, ACUTE
HCV Ab: <0.1 s/co ratio (ref 0.0–0.9)
Hep A IgM: NEGATIVE
Hep B C IgM: NEGATIVE
Hepatitis B Surface Ag: NEGATIVE

## 2016-10-06 LAB — GRAM STAIN: Gram Stain: NONE SEEN

## 2016-10-06 LAB — FOLATE: Folate: 18.4 ng/mL (ref >5.9)

## 2016-10-06 LAB — VITAMIN B12: Vitamin B-12: 1119 pg/mL — ABNORMAL HIGH (ref 180–914)

## 2016-10-06 MED ORDER — PREDNISOLONE 5 MG PO TABS: ORAL_TABLET | ORAL | 0 refills | Status: DC

## 2016-10-06 NOTE — Telephone Encounter (Signed)
Pt called and said she is having some nausea and wanted to know if she can take a phenergan tablet before the procedure. I spoke with Tana CoastLeslie Lewis, PA who said she prefers her NOT to take the phenergan prior to procedure if she is driving herself. Pt was informed and said OK. She has to be at hospital at 8:45 am.

## 2016-10-06 NOTE — Telephone Encounter (Signed)
Pt is aware to stay hydrated and go to ED if unable to stay hydrated or increased abdominal pain, fatigue or drowsiness.

## 2016-10-06 NOTE — Telephone Encounter (Signed)
Pt aware of results and plan. See result note.

## 2016-10-06 NOTE — Telephone Encounter (Signed)
Pt seen yesterday. Pt called today asking to speak with a nurse. I told her that the nurse was on another line. She wanted to know what her lab results were and was asking about a prescription. Please call her at [phone]

## 2016-10-06 NOTE — Telephone Encounter (Signed)
See result note.  

## 2016-10-06 NOTE — Progress Notes (Signed)
No evidence of SBP

## 2016-10-06 NOTE — Telephone Encounter (Signed)
Agree. Kara Stewart, please see result note for prednisilone and additional labs this week.   Please remind patient to drink plenty of fluids to stay hydrated.  If she is unable to hydrate, has increased abd pain, severe fatigue/drowsiness, etc then go to ed.

## 2016-10-06 NOTE — Progress Notes (Signed)
Pt is aware.  

## 2016-10-06 NOTE — Progress Notes (Signed)
She will need more labs on Thursday. CMET, PT/INR, mitochrondrial ab (AMA).  In the meantime, START prednisolone, 40 mg daily for 28 days, 30 mg for 4 days, 20 mg for 4 days, 10 mg for 4 days, 5 mg for 3 days, off. [facility]  No alcohol at all.

## 2016-10-06 NOTE — Progress Notes (Signed)
PT is aware to go to lab on Thursday at [facility]. She is also aware of the medication.  [facility] and told [facility] will have to order it. [facility] and [facility] She will let me know if there is a problem.

## 2016-10-06 NOTE — Procedures (Signed)
PreOperative Dx: Alcoholic hepatitis, ascites Postoperative Dx: Alcoholic hepatitis, ascites Procedure:   US guided paracentesis Radiologist:  Tyron RussellBoles Anesthesia:  10 ml of1% lidocaine Specimen:  3.1 L of dark yellow ascitic fluid EBL:   < 1 ml Complications: None

## 2016-10-06 NOTE — Progress Notes (Signed)
I called pt back and she said Kara Stewart was getting the [facility] for [facility] Pred[facility]one.

## 2016-10-06 NOTE — Progress Notes (Signed)
Paracentesis complete no signs of distress 3.1L dark yellow colored ascites removed.

## 2016-10-08 LAB — COMPREHENSIVE METABOLIC PANEL
ALT: 31 U/L — ABNORMAL HIGH (ref 6–29)
AST: 148 U/L — AB (ref 10–30)
Albumin: 2.5 g/dL — ABNORMAL LOW (ref 3.6–5.1)
Alkaline Phosphatase: 163 U/L — ABNORMAL HIGH (ref 33–115)
BILIRUBIN TOTAL: 13.4 mg/dL — AB (ref 0.2–1.2)
BUN: 9 mg/dL (ref 7–25)
CALCIUM: 8.1 mg/dL — AB (ref 8.6–10.2)
CO2: 24 mmol/L (ref 20–31)
Chloride: 100 mmol/L (ref 98–110)
Creat: 0.94 mg/dL (ref 0.50–1.10)
GLUCOSE: 84 mg/dL (ref 65–99)
Potassium: 3.6 mmol/L (ref 3.5–5.3)
Sodium: 136 mmol/L (ref 135–146)
Total Protein: 5.9 g/dL — ABNORMAL LOW (ref 6.1–8.1)

## 2016-10-08 NOTE — Progress Notes (Signed)
Cytology unremarkable.

## 2016-10-09 ENCOUNTER — Telehealth: Payer: Self-pay

## 2016-10-09 LAB — PROTIME-INR
INR: 1.5 — ABNORMAL HIGH
Prothrombin Time: 15.2 s — ABNORMAL HIGH (ref 9.0–11.5)

## 2016-10-09 NOTE — Telephone Encounter (Signed)
Pt called asking about labs she had done yesterday. Also, said her GYN wants Verlon AuLeslie to describe a Birth control pill for her that will not affect her liver. Said she had briefly discussed with Verlon AuLeslie previously. Please advise!

## 2016-10-09 NOTE — Progress Notes (Signed)
PT is aware of results and plan for labs in one week. She is aware to discuss birth control with GYN. She also wanted Verlon AuLeslie to know that her prescription for the Prenisolone is in liquid form. That is the only way they could get it for her. She has just been given the 40 mg for now. She has to go back later for the other due to her insurance.

## 2016-10-09 NOTE — Progress Notes (Signed)
Please let patient know that all of her labs are not back yet but her potassium is normal, kidney function is normal. Her liver labs are improved but remaining significantly elevated.  She needs to continue her prednisilone. Repeat LFTs, PT/INR in one week.   Please also let patient know that we do not prescribe birth control, that is out of the scope of our practice. She needs to address birth control concerns with her gynecologist. Would advise AGAINST pregnancy right now given decompensated liver disease.

## 2016-10-09 NOTE — Telephone Encounter (Signed)
See result note. Please call patient. Thanks!

## 2016-10-09 NOTE — Progress Notes (Signed)
Noted  

## 2016-10-11 LAB — CULTURE, BODY FLUID W GRAM STAIN -BOTTLE: Culture: NO GROWTH

## 2016-10-11 LAB — CULTURE, BODY FLUID-BOTTLE

## 2016-10-11 NOTE — Progress Notes (Signed)
REVIEWED.  JUL 2018 MELD  SCORE 22, CHILD PUGH C(11)-LIFE EXPECTANCY 1-3 YEARS.

## 2016-10-12 ENCOUNTER — Other Ambulatory Visit: Payer: Self-pay

## 2016-10-12 DIAGNOSIS — K7011 Alcoholic hepatitis with ascites: Secondary | ICD-10-CM

## 2016-10-12 LAB — MITOCHONDRIAL ANTIBODIES: MITOCHONDRIAL M2 AB, IGG: 65 U — AB (ref <=20.0)

## 2016-10-12 NOTE — Progress Notes (Signed)
Lab orders on file for 10/16/2016. Pt is aware.

## 2016-10-12 NOTE — Telephone Encounter (Signed)
Noted  

## 2016-10-13 ENCOUNTER — Telehealth: Payer: Self-pay

## 2016-10-13 ENCOUNTER — Other Ambulatory Visit: Payer: Self-pay

## 2016-10-13 DIAGNOSIS — K7011 Alcoholic hepatitis with ascites: Secondary | ICD-10-CM

## 2016-10-13 NOTE — Telephone Encounter (Signed)
Pt is aware.  

## 2016-10-13 NOTE — Telephone Encounter (Signed)
Pt called for remainder of lab results and also to say that she would like to be on cancellation list for appt. She is having swelling in her feet and legs.  She said the site where she had the PARA gets hard and tender to the touch.  Please advise!

## 2016-10-13 NOTE — Telephone Encounter (Signed)
Mitochondrial ab came back and was elevated but can be abnormal in setting of etoh hepatitis. We will be rechecking at a later date. Would recheck in 2 months to see if improves with resolution of etoh hepatitis.   Is the site where she had tap red at all?  Right now there are no sooner appts then 10/23/16. Agree with putting her on cancellation list.   We can start aldactone 50mg  daily, #30, no refills for the swelling but if her swelling gets worse she should see her PCP in interim.   Remind her to have her labs 10/16/16.

## 2016-10-13 NOTE — Telephone Encounter (Signed)
Pt is aware of the results and the plan to recheck Mitochondrial antibody in 2 months. Lab order on file.  The site where she had the para is a little red, but she said it is hard to see it good. It had 3 bandages over it and she has taken two off, and was advised not to take the last one off, that it would fall off. But the place kindly protrudes when she is moving around.   I have called her Aldactone 50 mg, to [place],  to take one daily and no refills.).   She is aware we have her on a cancellation list, and if she gets worse in the interim she should see her PCP.   She was also reminded to get her labs done as schedule for 10/16/2016.

## 2016-10-13 NOTE — Telephone Encounter (Signed)
Would advise her to take bandaid off. If she feels like redness or pain at site is getting worse she will been to be seen.

## 2016-10-16 NOTE — Progress Notes (Signed)
Noted  

## 2016-10-19 NOTE — Progress Notes (Signed)
Pt called and said she had some car trouble is the reason she did not have labs done on Friday. She will try to go tomorrow.  Also, she said that she has got some blisters near her umbilical near where the para was done. Started out like little pimples and then looks like it is filled with yellow substance. Please advise! Forwarding to EG also in Leslie's absence this afternoon.

## 2016-10-19 NOTE — Progress Notes (Signed)
Patient was supposed to get her labs done Friday. Can we ask her to have them done? Thanks.

## 2016-10-19 NOTE — Progress Notes (Signed)
LMOM that pt was supposed to do labs Friday and to try to do today and call and let us know.

## 2016-10-20 LAB — HEPATIC FUNCTION PANEL
ALBUMIN: 2.9 g/dL — AB (ref 3.6–5.1)
ALT: 121 U/L — ABNORMAL HIGH (ref 6–29)
AST: 170 U/L — ABNORMAL HIGH (ref 10–30)
Alkaline Phosphatase: 115 U/L (ref 33–115)
BILIRUBIN TOTAL: 6 mg/dL — AB (ref 0.2–1.2)
Bilirubin, Direct: 3 mg/dL — ABNORMAL HIGH (ref <=0.2)
Indirect Bilirubin: 3 mg/dL — ABNORMAL HIGH (ref 0.2–1.2)
TOTAL PROTEIN: 5.4 g/dL — AB (ref 6.1–8.1)

## 2016-10-20 LAB — PROTIME-INR
INR: 1.2 — ABNORMAL HIGH
Prothrombin Time: 12.6 s — ABNORMAL HIGH (ref 9.0–11.5)

## 2016-10-20 NOTE — Progress Notes (Signed)
PT is aware and said she has had a new spot come up on her back. We do not have any appts for tomorrow. She does not have a PCP at this time, she is on waiting list to get in with new PCP and looks like it might be Sept. Please advise!

## 2016-10-20 NOTE — Progress Notes (Signed)
Reviewed and agree.

## 2016-10-20 NOTE — Progress Notes (Signed)
Lesions with yellow substance sounds like vesicles. Could be contact dermatitis, could be shingles. Less likely related to abdominal paracentesis. She really needs to be evaluated for this as it is difficult to go based just on description by the patient. If we cannot see her within 24 hours, I would advise her to see her PCP in case this is shingles so that she can start appropriate treatment.

## 2016-10-20 NOTE — Progress Notes (Signed)
I spoke to [place] and she is aware pt does not have a PCP at this time, and that we have no appts for tomorrow. She said if pt gets worse to go to Urgent Care or ED. Otherwise, keep appt on Friday morning. Pt did say she has redness underneath the blisters and it is not right at the place she had para, but a little out from that location. The spot on her back she described as being at her mid area in the back, as if you drew a line from the front area around to it. PT is also aware we have her on cancellation list.

## 2016-10-20 NOTE — Progress Notes (Signed)
LMOM for a return call.  

## 2016-10-21 ENCOUNTER — Encounter: Payer: Self-pay | Admitting: Nurse Practitioner

## 2016-10-21 ENCOUNTER — Ambulatory Visit (INDEPENDENT_AMBULATORY_CARE_PROVIDER_SITE_OTHER): Payer: Medicaid Other | Admitting: Nurse Practitioner

## 2016-10-21 VITALS — BP 112/74 | HR 84 | Temp 98.4°F | Ht 64.5 in | Wt 160.0 lb

## 2016-10-21 DIAGNOSIS — R21 Rash and other nonspecific skin eruption: Secondary | ICD-10-CM | POA: Diagnosis not present

## 2016-10-21 DIAGNOSIS — K7011 Alcoholic hepatitis with ascites: Secondary | ICD-10-CM | POA: Diagnosis not present

## 2016-10-21 NOTE — Assessment & Plan Note (Signed)
She has yellow-colored vesicular type lesions and an approximate dermatomal pattern around the T10 dermatome with 1 lesion on her left back and 3 on her anterior abdomen. She has a history of chickenpox as a child. She is on prednisolone currently for alcoholic hepatitis. Appears to be a shingles rash related to temporary immunocompromised with prednisolone. She states the blisters on her abdomen have a burning pain associated with them. Seems to be tolerable to her. Unfortunately, there is no great treatment option. Data is limited on any no competent hosts for starting antivirals more than 72 hours after eruption. However, she is somewhat immunocompromised with steroids and her most recent lesion was within the past 48 hours. However, most antivirals for shingles including acyclovir, valacyclovir, famciclovir have hepatic dosing or hepatic effects due to hepatic clearance. I will discuss further with Dr. Darrick Pennafields to try to discern the best treatment option moving forward versus watchful waiting. The patient does not have a primary care provider.

## 2016-10-21 NOTE — Progress Notes (Addendum)
REVIEWED. Stop prednisolone. START URSO DUE TO AMA 65.  Referring Provider: No ref. provider found Primary Care Physician:  Patient, No Pcp Per Primary GI:  Dr. Darrick Penna  Chief Complaint  Patient presents with  . swelling    abd, feet/ankles  . possible shingles    abd and back; started 10/16/16    HPI:   Kara Stewart is a 41 y.o. female who presents for follow-up on alcoholic hepatitis. The patient was last seen in our office 10/05/2016 for abdominal pain, abdominal swelling, and alcoholic hepatitis. She was recently admitted to [facility] for ETOH hepatitis and was advised to follow-up with [place]. She was previously seen by [place] during hospitalization in August 2017 for hematemesis due to a Mallory-Weiss tear and LA grade B esophagitis. Known history of alcohol abuse and previous episodes alcoholic hepatitis. Has had 6+ months of sobriety with occasional "slip ups." She was seen in the emergency department December 2017 for alcohol withdrawal and again in January 2018 with nausea, vomiting, elevated ethanol level. At her last visit she stated she went on an alcohol binge for a week after her third Street miscarriage.  Admitted earlier this month with hypokalemia and abdominal pain with CT demonstrating new mild ascites, stable hepatomegaly, severe hepatic steatosis, stable mild splenomegaly. Initially her total bilirubin was 5.3, alkaline phosphatase 192, AST 123, ALT 40, INR 1.4. Lipase 30.3. At time of discharge her total bilirubin was 3.6, alkaline phosphatase 157, AST 113, ALT 34, albumin 2.4. Bilirubin was not differentiated. Her creatinine was 0.48. White blood cell count 2800, hemoglobin 10.5, hematocrit 31.5, MCV 122.6, platelets 149,000. ANA negative, smooth muscle antibody negative. Hepatitis A IgM negative. Hepatitis B surface antigen negative. Hepatitis B core IgM negative. Hepatitis C antibody negative. Ceruloplasmin normal at 20.7.  At her last visit she denied alcohol  use since hospital admission, noted increasing abdominal distention and jaundice. Increased dyspnea requiring inhaler use. Bowel movements several times a day without diarrhea. No rectal bleeding or melena. Noted abdominal pressure from distention. She notes she was feeling better at that time compared to during her hospitalization. On dietary recall she consumes a lot of sodium, can soups, lunch meat, pickles. Recommended she avoid all alcohol. The patient admits that she does not stop drinking she will likely die. Recommended updating labs and plan for abdominal paracentesis with fluid analysis, recommended 2 g sodium diet.  CBC demonstrated leukocytosis with a white blood cell count of 11.0, hemoglobin normal at 13.0, platelets normal at 283. Hepatic function panel with decreased albumin at 2.0, AST elevated at 218, normal ALT, alkaline phosphatase elevated at 149, total bilirubin significantly elevated at 17.1 with 9.2 direct and 7.9 indirect. Kidney function normal with creatinine 0.82. INR 1.51. Acute hepatitis panel negative. Quantitative hCG less than 5/negative. She was started on prednisolone based on discriminate function calculation with dosing of 40 mg for 28 days, 30 mg for 4 days, 20 mg for 4 days, 10 mg 4 days, 5 mg for 3 days, then stop.  Ultrasound paracentesis completed 10/06/2016 with 3.1 L of fluid removed. Cytology unremarkable, no evidence of spontaneous bacterial peritonitis. Fluid culture without gross.  Repeat labs on 10/08/2016 showed normal kidney function and potassium. Bilirubin improved at 13.4, AST/ALT remain elevated at 148/31, alkaline phosphatase Maines elevated at 163, albumin is still low at 2.5 but improved. INR remains elevated, but stable at 1.5. Antimitochondrial antibody elevated at 65 which could be due to alcoholic hepatitis.  Labs repeated again yesterday found continued improvement with  bilirubin 6.0, AST/ALT remain elevated at 170/121, albumin still low at 2.9  but improving. INR improved but remains elevated at 1.2.  The patient called on 10/13/2016 complaining of swelling in her feet and legs as well as tenderness at the paracentesis site. Recommended recheck antimitochondrial antibody in 2 months. Her appointment was bumped up. She was started on Aldactone 50 mg daily.  The patient also called to inform us she does not have a primary care at this time. She was recommended to go to urgent care emergency department for any worsening. She did say she had some redness underneath the blisters but some additional lesions away from the location including a spot on her back which she feels is linear in distribution. There is a question of shingles flare due to steroid use.  Today she states she feels better then she did. Prednisolone is making her feel "hyped up, energetic." Paracentesis site is better, no longer with pain; still firm in that area however. Edema has improved, but still with some periumbilical swelling and bilateral LE edema. Not currently using compression socks/TED hose. She does try to elevated her feet when sitting. Jaundice much improved. Denies darkened urine, generalized pruritis, acute episodic confusion. Energy is good. Denies hand flapping/tremors. Denies hematochezia, melena, N/V. Has developed "blister-like spots" starting 5 days ago, last one on her back appeared 2 days ago. Has had chicken pox as a child. Denies chest pain, dyspnea, dizziness, lightheadedness, syncope, near syncope. Denies any other upper or lower GI symptoms.  MELD/Child-Pugh progression: 10/05/16: MELD 22, Child-Pugh C (11) 10/08/16: MELD 22, Child-Pugh C (10) 10/20/16 (with Cr from 10/08/16): Meld 19, Child-Pugh B (9)  Past Medical History:  Diagnosis Date  . Acid reflux   . Alcohol abuse 07/21/2012  . 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 Anxiety   . Asthma   . BV (bacterial vaginosis) 04/26/2015  . Colitis   . Dyspepsia 2004   Dx w/ PUD but no EGD, [place], [place]   .  Hiatal hernia   . History of alcohol abuse 04/26/2015  . History of colitis 11/29/2014  . Hypertension   . Hypertriglyceridemia   . Hypothyroid   . Iritis    frequent  . Irregular periods 01/11/2014  . Ovarian cyst   . Panic attacks   . RLQ abdominal pain 01/11/2014  . SVT (supraventricular tachycardia) (HCC)   . Thyroid nodule   . Trichomonal vaginitis 01/11/2014  . Vaginal discharge 01/11/2014  . Weight gain 01/11/2014    Past Surgical History:  Procedure Laterality Date  . BIOPSY N/A 02/05/2015   Procedure: BIOPSY;  Surgeon: West Bali, MD;  Location: [facility];  Service: Endoscopy;  Laterality: N/A;  . COLONOSCOPY WITH PROPOFOL N/A 02/05/2015   [id number] HH/mild diverticulosis  . ESOPHAGEAL DILATION N/A 11/21/2015   Procedure: ESOPHAGEAL DILATION;  Surgeon: Corbin Ade, MD;  Location: [facility] SUITE;  Service: Endoscopy;  Laterality: N/A;  . ESOPHAGOGASTRODUODENOSCOPY  11/06/2011   [name]: MILD Esophagitis/PATENT ESOPHAGEAL Stricture/  Moderate gastritis. Bx no.hpylori or celiac, +gastritis  . ESOPHAGOGASTRODUODENOSCOPY (EGD) WITH PROPOFOL N/A 03/20/2014   [name]: 1. Mild esophagitis & distal esophagela stricture. 2. small hiatal hernia 3. moderate non-erosive gastritis and mild duodenits  . ESOPHAGOGASTRODUODENOSCOPY (EGD) WITH PROPOFOL N/A 11/21/2015   Procedure: ESOPHAGOGASTRODUODENOSCOPY (EGD) WITH PROPOFOL;  Surgeon: Corbin Ade, MD;  Location: [facility] SU[facility];  Service: Endoscopy;  Laterality: N/A;  . SAVORY DILATION N/A 03/20/2014   Procedure: SAVORY DILATION;  Surgeon: West Bali, MD;  Location:  [facility];  Service: Endoscopy;  Laterality: N/A;  dilated with # 12.8, 14,15,16  . TOOTH EXTRACTION      Current Outpatient Prescriptions  Medication Sig Dispense Refill  . metoprolol tartrate (LOPRESSOR) 25 MG tablet Take 12.5 mg by mouth as needed (for palpitations). Reported on 08/28/2015    . Multiple Vitamin (MULTIVITAMIN) tablet Take 1 tablet by mouth daily.    . Multiple  Vitamins-Minerals (HAIR SKIN NAILS PO) Take by mouth daily.    . prednisoLONE 5 MG TABS tablet Take 40mg  orally daily for 28 days, then 30mg  daily for 4 days, 20mg  daily for 4 days, 10mg  daily for 4 days, then 5mg  daily for 3 days 275 each 0  . spironolactone (ALDACTONE) 50 MG tablet Take 50 mg by mouth daily.    Jeananne Rama. Tetrahydrozoline-Zn Sulfate (ALLERGY RELIEF EYE DROPS OP) Apply 2 drops to eye daily.    Marland Kitchen. thiamine 100 MG tablet Take 1 tablet (100 mg total) by mouth daily. (Patient taking differently: Take 200 mg by mouth daily. ) 30 tablet 0  . diphenhydrAMINE (BENADRYL) 25 mg capsule Take 25 mg by mouth every 6 (six) hours as needed.    Marland Kitchen. ibuprofen (ADVIL,MOTRIN) 200 MG tablet Take 600 mg by mouth every 6 (six) hours as needed.    Marland Kitchen. MILK THISTLE PO Take by mouth 3 (three) times daily.    . mometasone-formoterol (DULERA) 100-5 MCG/ACT AERO Inhale 2 puffs into the lungs 2 (two) times daily. (Patient not taking: Reported on 10/21/2016) 13 g 3   No current facility-administered medications for this visit.     Allergies as of 10/21/2016 - Review Complete 10/21/2016  Allergen Reaction Noted  . Penicillins Anaphylaxis 10/18/2011  . Lidocaine Other (See Comments) 06/22/2014    Family History  Problem Relation Age of Onset  . Stomach cancer Paternal Grandfather        colon cancer  . Cancer Paternal Grandfather        throat and esophagus  . Breast cancer Maternal Grandmother   . Cancer Maternal Grandmother        skin  . Anxiety disorder Maternal Grandmother   . Hypertension Mother   . Other Mother        fatty liver  . Hyperlipidemia Mother   . Liver disease Mother        fatty liver, does not drink.   . Other Father        varicose veins; stomach issues; hernia  . Hypertension Father   . Hyperlipidemia Father   . Cancer Father        prostate  . Arthritis Father        rheumatoid  . Thyroid disease Sister   . Other Brother        hernia  . Diabetes Maternal Grandfather   .  Other Paternal Grandmother        hernia  . COPD Paternal Grandmother     Social History   Social History  . Marital status: Legally Separated    Spouse name: N/A  . Number of children: 0  . Years of education: N/A   Occupational History  . was at [employer] but not working now    Social History Main Topics  . Smoking status: Current Some Day Smoker    Types: Cigarettes  . Smokeless tobacco: Never Used  . Alcohol use No     Comment: h/o etoh abuse, quit early 2017 but started back summer 2017.  . Drug use: No  .  Sexual activity: Yes    Birth control/ protection: None   Other Topics Concern  . None   Social History Narrative   Lives w/ grandfather or parents or family member (moved from [place] 1 month)   Previous MD: Phill Mutter, NP ([place], [place])          Review of Systems: Complete ROS negative except as per HPI.   Physical Exam: BP 112/74   Pulse 84   Temp 98.4 F (36.9 C) (Oral)   Ht 5' 4.5" (1.638 m)   Wt 160 lb (72.6 kg)   BMI 27.04 kg/m  General:   Alert and oriented. Pleasant and cooperative. Well-nourished and well-developed.  Eyes:  Mild to minimal scleral icterus, nearly resolved.  Ears:  Normal auditory acuity. Cardiovascular:  S1, S2 present without murmurs appreciated. Extremities without clubbing. Respiratory:  Clear to auscultation bilaterally. No wheezes, rales, or rhonchi. No distress.  Gastrointestinal:  +BS, soft, non-tender and non-distended. No splenomegaly. Hepatomegaly noted 2-3 fingerbreadths below the right costal margin without tenderness. No guarding or rebound. No masses appreciated. Right abdominal paracentesis site soft, no edema, erythema, warmth, tenderness; no drainage noted. Bilateral LE edema 1+ through to the mid-calf and trace up through to her knee. Rectal:  Deferred  Musculoskalatal:  Symmetrical without gross deformities. Skin:  Intact. Yellowish vesicular lesions noted one on the left back and three on the anterior abdomen  without drainage in a linear/dermatomal distribution and pattern at approximate T10 dermatome. Neurologic:  Alert and oriented x4;  grossly normal neurologically. Psych:  Alert and cooperative. Normal mood and affect. Heme/Lymph/Immune: No excessive bruising noted.    10/21/2016 3:24 PM   Disclaimer: This note was dictated with voice recognition software. Similar sounding words can inadvertently be transcribed and may not be corrected upon review.

## 2016-10-21 NOTE — Progress Notes (Signed)
Pt has appt today with Wynne DustEric Gill, NP at 2:00 pm.

## 2016-10-21 NOTE — Progress Notes (Signed)
No pcp per patient 

## 2016-10-21 NOTE — Assessment & Plan Note (Signed)
Alcoholic hepatitis which seems to be improving. She does still have hepatomegaly. Her bilirubin has improved substantially to 6.0 from initial level of 17.6. Her other liver enzymes seemed to be stable or decreasing. She stated her recent illness significantly scared her and she has decided to avoid all alcohol and permanently. She even returned baking vanilla because it listed a small amount of ethanol in it. At this point I recommend she continue prednisolone for the full course. I will recheck her labs in 2 weeks including CMP and INR in order to update her lab values and recalculate meld and child Pugh. Reinforced alcohol cessation strictly, low sodium diet. She does have some improved but persistent bilateral lower extremity edema with 1+ pitting edema up to her mid calf and trace edema through to her knee. Recommended investigating compression athletic socks at the pharmacy to help, elevate lower extremities 1 possible. Return for follow-up in 4 weeks. Call if any worsening symptoms or new symptoms.

## 2016-10-21 NOTE — Patient Instructions (Addendum)
1. Continue taking the prednisone steroids for the full course. 2. We will recheck your labs in 2 weeks. 3. Try to follow a 2 g daily sodium diet. This will help with her swallowing. 4. Continue the fluid pill to help with the swelling in your legs. 5. Strictly avoid all alcohol. 6. I will discuss possible treatment options for your rash with Dr. Darrick Pennafields and we will notify you of recommendations. 7. Return for follow-up in 4 weeks. 8. Call if any worsening symptoms or recurrent symptoms.     Low-Sodium Eating Plan Sodium, which is an element that makes up salt, helps you maintain a healthy balance of fluids in your body. Too much sodium can increase your blood pressure and cause fluid and waste to be held in your body. Your health care provider or dietitian may recommend following this plan if you have high blood pressure (hypertension), kidney disease, liver disease, or heart failure. Eating less sodium can help lower your blood pressure, reduce swelling, and protect your heart, liver, and kidneys. What are tips for following this plan? General guidelines  Most people on this plan should limit their sodium intake to 1,500-2,000 mg (milligrams) of sodium each day. Reading food labels  The Nutrition Facts label lists the amount of sodium in one serving of the food. If you eat more than one serving, you must multiply the listed amount of sodium by the number of servings.  Choose foods with less than 140 mg of sodium per serving.  Avoid foods with 300 mg of sodium or more per serving. Shopping  Look for lower-sodium products, often labeled as "low-sodium" or "no salt added."  Always check the sodium content even if foods are labeled as "unsalted" or "no salt added".  Buy fresh foods. ? Avoid canned foods and premade or frozen meals. ? Avoid canned, cured, or processed meats  Buy breads that have less than 80 mg of sodium per slice. Cooking  Eat more home-cooked food and less  restaurant, buffet, and fast food.  Avoid adding salt when cooking. Use salt-free seasonings or herbs instead of table salt or sea salt. Check with your health care provider or pharmacist before using salt substitutes.  Cook with plant-based oils, such as canola, sunflower, or olive oil. Meal planning  When eating at a restaurant, ask that your food be prepared with less salt or no salt, if possible.  Avoid foods that contain MSG (monosodium glutamate). MSG is sometimes added to Congohinese food, bouillon, and some canned foods. What foods are recommended? The items listed may not be a complete list. Talk with your dietitian about what dietary choices are best for you. Grains Low-sodium cereals, including oats, puffed wheat and rice, and shredded wheat. Low-sodium crackers. Unsalted rice. Unsalted pasta. Low-sodium bread. Whole-grain breads and whole-grain pasta. Vegetables Fresh or frozen vegetables. "No salt added" canned vegetables. "No salt added" tomato sauce and paste. Low-sodium or reduced-sodium tomato and vegetable juice. Fruits Fresh, frozen, or canned fruit. Fruit juice. Meats and other protein foods Fresh or frozen (no salt added) meat, poultry, seafood, and fish. Low-sodium canned tuna and salmon. Unsalted nuts. Dried peas, beans, and lentils without added salt. Unsalted canned beans. Eggs. Unsalted nut butters. Dairy Milk. Soy milk. Cheese that is naturally low in sodium, such as ricotta cheese, fresh mozzarella, or Swiss cheese Low-sodium or reduced-sodium cheese. Cream cheese. Yogurt. Fats and oils Unsalted butter. Unsalted margarine with no trans fat. Vegetable oils such as canola or olive oils. Seasonings and other  foods Fresh and dried herbs and spices. Salt-free seasonings. Low-sodium mustard and ketchup. Sodium-free salad dressing. Sodium-free light mayonnaise. Fresh or refrigerated horseradish. Lemon juice. Vinegar. Homemade, reduced-sodium, or low-sodium soups. Unsalted  popcorn and pretzels. Low-salt or salt-free chips. What foods are not recommended? The items listed may not be a complete list. Talk with your dietitian about what dietary choices are best for you. Grains Instant hot cereals. Bread stuffing, pancake, and biscuit mixes. Croutons. Seasoned rice or pasta mixes. Noodle soup cups. Boxed or frozen macaroni and cheese. Regular salted crackers. Self-rising flour. Vegetables Sauerkraut, pickled vegetables, and relishes. Olives. [place] fries. Onion rings. Regular canned vegetables (not low-sodium or reduced-sodium). Regular canned tomato sauce and paste (not low-sodium or reduced-sodium). Regular tomato and vegetable juice (not low-sodium or reduced-sodium). Frozen vegetables in sauces. Meats and other protein foods Meat or fish that is salted, canned, smoked, spiced, or pickled. Bacon, ham, sausage, hotdogs, corned beef, chipped beef, packaged lunch meats, salt pork, jerky, pickled herring, anchovies, regular canned tuna, sardines, salted nuts. Dairy Processed cheese and cheese spreads. Cheese curds. Blue cheese. Feta cheese. String cheese. Regular cottage cheese. Buttermilk. Canned milk. Fats and oils Salted butter. Regular margarine. Ghee. Bacon fat. Seasonings and other foods Onion salt, garlic salt, seasoned salt, table salt, and sea salt. Canned and packaged gravies. Worcestershire sauce. Tartar sauce. Barbecue sauce. Teriyaki sauce. Soy sauce, including reduced-sodium. Steak sauce. Fish sauce. Oyster sauce. Cocktail sauce. Horseradish that you find on the shelf. Regular ketchup and mustard. Meat flavorings and tenderizers. Bouillon cubes. Hot sauce and Tabasco sauce. Premade or packaged marinades. Premade or packaged taco seasonings. Relishes. Regular salad dressings. Salsa. Potato and tortilla chips. Corn chips and puffs. Salted popcorn and pretzels. Canned or dried soups. Pizza. Frozen entrees and pot pies. Summary  Eating less sodium can help lower  your blood pressure, reduce swelling, and protect your heart, liver, and kidneys.  Most people on this plan should limit their sodium intake to 1,500-2,000 mg (milligrams) of sodium each day.  Canned, boxed, and frozen foods are high in sodium. Restaurant foods, fast foods, and pizza are also very high in sodium. You also get sodium by adding salt to food.  Try to cook at home, eat more fresh fruits and vegetables, and eat less fast food, canned, processed, or prepared foods. This information is not intended to replace advice given to you by your health care provider. Make sure you discuss any questions you have with your health care provider. Document Released: 09/12/2001 Document Revised: 03/16/2016 Document Reviewed: 03/16/2016 Elsevier Interactive Patient Education  2017 [employer].

## 2016-10-21 NOTE — Progress Notes (Signed)
Bilirubin and alk phos improved. Patient seen in office today. Plans on repeat lfts in 2 weeks.

## 2016-10-22 ENCOUNTER — Other Ambulatory Visit: Payer: Self-pay

## 2016-10-22 ENCOUNTER — Telehealth: Payer: Self-pay | Admitting: Nurse Practitioner

## 2016-10-22 ENCOUNTER — Telehealth: Payer: Self-pay | Admitting: Gastroenterology

## 2016-10-22 DIAGNOSIS — K7011 Alcoholic hepatitis with ascites: Secondary | ICD-10-CM

## 2016-10-22 DIAGNOSIS — K769 Liver disease, unspecified: Secondary | ICD-10-CM

## 2016-10-22 DIAGNOSIS — R21 Rash and other nonspecific skin eruption: Secondary | ICD-10-CM

## 2016-10-22 DIAGNOSIS — K7010 Alcoholic hepatitis without ascites: Secondary | ICD-10-CM

## 2016-10-22 MED ORDER — URSODIOL 500 MG PO TABS: 500.0000 mg | ORAL_TABLET | Freq: Two times a day (BID) | ORAL | 3 refills | Status: DC

## 2016-10-22 NOTE — Telephone Encounter (Signed)
Pt seen by EG yesterday, she called today saying that she would need a prescription for compression stockings and needed it sent to [facility]. She has appointment on Saturday at the pharmacy for fitting the stockings, but she needs rx called in first.

## 2016-10-22 NOTE — Telephone Encounter (Signed)
Rx provided to nurse to fax to [facility]

## 2016-10-22 NOTE — Telephone Encounter (Signed)
Forwarding to Wynne DustEric Gill, NP for the prescription.

## 2016-10-22 NOTE — Telephone Encounter (Signed)
Communicated to patient to taper down and stop prednisolone: change to 20 mg x 4 days, 10 mg x 4 days, stop). Start Urso Forte based on 15 mg/kg/day in 2 divided doses (Urso Forte available in 500 mg)

## 2016-10-23 ENCOUNTER — Telehealth: Payer: Self-pay | Admitting: Gastroenterology

## 2016-10-23 ENCOUNTER — Ambulatory Visit: Payer: Medicaid Other | Admitting: Gastroenterology

## 2016-10-23 NOTE — Telephone Encounter (Signed)
[phone] patient has questions about the medication she was prescribed from here

## 2016-10-23 NOTE — Telephone Encounter (Signed)
PT said her mom was asking since she has all of the health issues going on , would it be be advisable to sign up for disability. I told her I would ask Wynne DustEric Gill, NP, who saw her last and it will be first of the week before I will be back in touch with her since we leave at noon today and he is seeing pt's. She said that is fine. She did get the URSO and got started on it.

## 2016-10-26 ENCOUNTER — Telehealth: Payer: Self-pay

## 2016-10-26 NOTE — Telephone Encounter (Signed)
I couldn't not find any adverse reaction of weight loss related to urso. In all likelyhood the difference in weight is either diuresis or using different scales at different times of day...or a combination of these. Continue to monitor, let us know of any worsening weight loss or other concerning symptoms.

## 2016-10-26 NOTE — Telephone Encounter (Signed)
Pt is concerned about weight loss since her last OV. She weighed 160 lb at ov on 10/21/2016. She is now down to 154.5 lbs. Said she has been on the Aldactone for awhile, but was concerned about the new medication URSO. Said she is eating more and she does not have any swelling in her extremities, but she is just worried about the change in weight. Please advise!

## 2016-10-27 NOTE — Telephone Encounter (Signed)
Pt is aware.  

## 2016-10-27 NOTE — Telephone Encounter (Signed)
She would have to apply for disability. They would send us papers for medical information, but they would make the determination. I'm not sure what the rules/requirements are related to disability for liver disease.

## 2016-10-28 NOTE — Telephone Encounter (Signed)
PT is aware.

## 2016-10-29 ENCOUNTER — Ambulatory Visit: Payer: Self-pay | Admitting: Nurse Practitioner

## 2016-11-02 ENCOUNTER — Telehealth: Payer: Self-pay

## 2016-11-02 NOTE — Telephone Encounter (Signed)
PT is aware. She [facility] per Lewie LoronAnna Boone, NP. ( She said CMP not CBC). Pt is aware per Tobi Bastosnna that she should go to Urgent Care for the blisters since she is on waiting list for Sept for PCP.

## 2016-11-02 NOTE — Telephone Encounter (Signed)
Pt called and said she read that side effects of the URSO are weakness and fever. She has been really weak and and feeling so exhaused. Said she ran a fever the last two days. She is not sure if it is coming from the medication. Also, The blisters that she had changed some.  The newer ones never developed as bad as the original ones. She has one that looks really bad and it is NOT fluid filled, but more of a dark brown color. Please advise!  Forwarding to Lewie LoronAnna Boone, NP to address in the absence of Wynne DustEric Gill, NP.

## 2016-11-02 NOTE — Telephone Encounter (Signed)
I don't believe the urso is causing this, but I will check with the pharmacy.  She really needs to see a PCP regarding the lesions she is talking about. This has been going on since last visit.   She should have repeat CBC, INR ordered upcoming. Go ahead and check that now.

## 2016-11-03 ENCOUNTER — Other Ambulatory Visit: Payer: Self-pay

## 2016-11-03 DIAGNOSIS — K7011 Alcoholic hepatitis with ascites: Secondary | ICD-10-CM

## 2016-11-04 LAB — COMPREHENSIVE METABOLIC PANEL
ALBUMIN: 3.3 g/dL — AB (ref 3.6–5.1)
ALK PHOS: 87 U/L (ref 33–115)
ALT: 43 U/L — AB (ref 6–29)
AST: 37 U/L — AB (ref 10–30)
BILIRUBIN TOTAL: 4.1 mg/dL — AB (ref 0.2–1.2)
BUN: 9 mg/dL (ref 7–25)
CALCIUM: 8.6 mg/dL (ref 8.6–10.2)
CO2: 21 mmol/L (ref 20–31)
Chloride: 106 mmol/L (ref 98–110)
Creat: 0.6 mg/dL (ref 0.50–1.10)
Glucose, Bld: 95 mg/dL (ref 65–99)
POTASSIUM: 3.6 mmol/L (ref 3.5–5.3)
Sodium: 138 mmol/L (ref 135–146)
TOTAL PROTEIN: 5.9 g/dL — AB (ref 6.1–8.1)

## 2016-11-04 LAB — PROTIME-INR
INR: 1.2 — ABNORMAL HIGH
PROTHROMBIN TIME: 13.1 s — AB (ref 9.0–11.5)

## 2016-11-05 ENCOUNTER — Telehealth: Payer: Self-pay | Admitting: Gastroenterology

## 2016-11-05 LAB — MITOCHONDRIAL ANTIBODIES: MITOCHONDRIAL M2 AB, IGG: 57.9 U — AB (ref <=20.0)

## 2016-11-05 NOTE — Telephone Encounter (Signed)
Forwarding to Anna Boone, NP.  

## 2016-11-05 NOTE — Telephone Encounter (Signed)
AMA is still pending. INR is stable from last drawn at 1.2. Transaminases (AST/ALT) MUCH improved! Bilirubin improved. Labs looking much better. These did not come to my box for some reason. I did not know they had resulted.   FYI to Minerva Areolaric who last saw patient.

## 2016-11-05 NOTE — Telephone Encounter (Signed)
Pt called to see if her lab results were back. [phone]

## 2016-11-05 NOTE — Telephone Encounter (Signed)
Noted  

## 2016-11-09 NOTE — Telephone Encounter (Signed)
Tried to call. Mailbox full and could not leave a message.

## 2016-11-09 NOTE — Telephone Encounter (Signed)
Tried to call and mailbox full. Mailing a letter to call.

## 2016-11-10 ENCOUNTER — Ambulatory Visit: Payer: Self-pay | Admitting: Family Medicine

## 2016-11-12 ENCOUNTER — Ambulatory Visit: Payer: Self-pay | Admitting: Nurse Practitioner

## 2016-11-16 ENCOUNTER — Ambulatory Visit: Payer: Self-pay | Admitting: Family Medicine

## 2016-11-24 ENCOUNTER — Telehealth: Payer: Self-pay | Admitting: Gastroenterology

## 2016-11-24 NOTE — Telephone Encounter (Signed)
Pt's mother called to ask how many days can Kara Stewart go without her medication (doesn't know the name of it) that is for her enzymes. Pt does not have anyone on her release of medical information and the patient's records have been faxed to [employer], [facility] [employer]. Please advise [phone] (Her father had called earlier and Misty Stanley told him we could not give out her medical information)

## 2016-11-24 NOTE — Telephone Encounter (Signed)
Forwarding note to Wynne Dust, NP who saw pt last.

## 2016-11-25 NOTE — Telephone Encounter (Signed)
She was started on Urso after admission with alcoholic Hepatitis (prednisolone was stopped because she developed Shingles) and her AMA increased. She should not be off of it.

## 2016-11-25 NOTE — Telephone Encounter (Signed)
Urso Forte 500 mg twice daily. 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 She was initially started on Prednisolone for 30 days (to reduce mortality risk associated with alcoholic pancreatitis) but has to stop due to Shingles outbreak as prednisolone has immunosuppressive effects.  Let me know if any other questions.

## 2016-11-25 NOTE — Telephone Encounter (Signed)
I spoke to [place] at [facility]. [phone])  She said we can fax the info about the medication to them @ [phone] ATTN Medication Room  Minerva Areola, can you write note in reference to the medication and importance and I will fax.  Also, Fredonia Highland will find out if pt can keep appt for Friday 11/27/2016 and let me know.

## 2016-11-25 NOTE — Telephone Encounter (Signed)
I put this info in letter form to fax Minerva Areola viewed and approved). Faxed to  Them at above info.

## 2016-11-27 ENCOUNTER — Ambulatory Visit: Payer: Medicaid Other | Admitting: Nurse Practitioner

## 2016-12-04 ENCOUNTER — Other Ambulatory Visit: Payer: Self-pay | Admitting: Gastroenterology

## 2016-12-10 ENCOUNTER — Telehealth: Payer: Self-pay

## 2016-12-10 NOTE — Telephone Encounter (Signed)
Results never came to my result box. I've looked at them and her liver labs are improved (see below for MELD progression). AMA is still elevated, but improved to 57 (from 65...normal is 25). Continue current medications and keep follow-up visit so we can trend her labs and make sure they keep improving.   Diflucan doesn't have any known interactions with Aldactone or Urso (which are the medications we prescribe her)    MELD/Child-Pugh progression: 10/05/16: MELD 22, Child-Pugh C (11) 10/08/16: MELD 22, Child-Pugh C (10) 10/20/16 (with Cr from 10/08/16): Meld 19, Child-Pugh B (9) 11/03/16: MELD 14, Child-Pugh: B; Discriminate Function down to 13.3.

## 2016-12-10 NOTE — Telephone Encounter (Signed)
Pt called for lab results from the end of July. Said her VM had been full and she could not get any messages and also she has had some other issues.   Minerva AreolaEric, I do not see final report on labs, Tobi Bastosnna had looked at some while you was out, but no result noted from provider [facility].   Please advise. Pt will call me back.  She also has been prescribed Diflucan for a yeast infection and would like to know if that is OK to take with her other meds.

## 2016-12-10 NOTE — Telephone Encounter (Signed)
Pt is supposed to call me, she cannot receive calls.

## 2016-12-11 NOTE — Telephone Encounter (Signed)
Pt called and is aware. She had missed an appt a couple of weeks ago and next available was in Oct. She wanted to schedule sooner, and has appt on 12/14/2016 at 2:00 pm with Lewie LoronAnna Boone, NP.

## 2016-12-14 ENCOUNTER — Ambulatory Visit (INDEPENDENT_AMBULATORY_CARE_PROVIDER_SITE_OTHER): Payer: Medicaid Other | Admitting: Gastroenterology

## 2016-12-14 ENCOUNTER — Encounter: Payer: Self-pay | Admitting: Gastroenterology

## 2016-12-14 VITALS — BP 102/61 | HR 89 | Temp 97.1°F | Ht 64.5 in | Wt 152.2 lb

## 2016-12-14 DIAGNOSIS — K7011 Alcoholic hepatitis with ascites: Secondary | ICD-10-CM

## 2016-12-14 DIAGNOSIS — R945 Abnormal results of liver function studies: Secondary | ICD-10-CM

## 2016-12-14 DIAGNOSIS — R7989 Other specified abnormal findings of blood chemistry: Secondary | ICD-10-CM

## 2016-12-14 NOTE — Progress Notes (Signed)
Referring Provider: No ref. provider found Primary Care Physician:  Patient, No Pcp Per Primary GI: Dr. Darrick PennaFields   Chief Complaint  Patient presents with  . alcoholic hepatitis    abd/LE swelling  . Abdominal Pain    HPI:   Kara Stewart is a 41 y.o. female presenting today with a history of ETOH hepatitis, elevated AMA and treated empirically with Urso. Known severe hepatic steatosis,  HSM, likely chronic liver disease. Interestingly, imaging last year with normal liver. Had been binge drinking and most current imaging with liver disease. Labs thus far with ANA, ASMA negative. Hep A negative, Hep B surface antigen negative, Hep B Core IgM negative, Hep C antibody negative. Ceruloplasmin low normal at 20.7.    EGD last year with LA grade B esophagitis, MW tear. LUQ/RUQ discomfort intermittently. On aldactone 50 mg daily. Took clindamycin for 10 days for an oral lesion and was also taking naproxen for swelling of tongue. Recently got over shingles. No ETOH since May 2018. Paracentesis July 3 with 3.1 liters removed. Following a low sodium diet. Using Mrs Sharilyn SitesDash for seasoning. Doesn't add extra salt to meals. Staying with 2gram sodium diet now; however, for about a month was incarcerated due to an old ticket and was not able to monitor diet. No added salt at that time.   She will need abdominal imaging in Dec 2018. Need to assess immune states for Hep B.    Past Medical History:  Diagnosis Date  . Acid reflux   . Alcohol abuse 07/21/2012  . 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 Anxiety   . Asthma   . BV (bacterial vaginosis) 04/26/2015  . Colitis   . Dyspepsia 2004   Dx w/ PUD but no EGD, [place], [place]   . Hiatal hernia   . History of alcohol abuse 04/26/2015  . History of colitis 11/29/2014  . Hypertension   . Hypertriglyceridemia   . Hypothyroid   . Iritis    frequent  . Irregular periods 01/11/2014  . Ovarian cyst   . Panic attacks   . RLQ abdominal pain 01/11/2014  . SVT  (supraventricular tachycardia) (HCC)   . Thyroid nodule   . Trichomonal vaginitis 01/11/2014  . Vaginal discharge 01/11/2014  . Weight gain 01/11/2014    Past Surgical History:  Procedure Laterality Date  . BIOPSY N/A 02/05/2015   Procedure: BIOPSY;  Surgeon: West BaliSandi L Fields, MD;  Location: [facility] ORS;  Service: Endoscopy;  Laterality: N/A;  . COLONOSCOPY WITH PROPOFOL N/A 02/05/2015   ZOX:WRUEASLF:small HH/mild diverticulosis  . ESOPHAGEAL DILATION N/A 11/21/2015   Procedure: ESOPHAGEAL DILATION;  Surgeon: Corbin Adeobert M Rourk, MD;  Location: [facility] ENDO SUITE;  Service: Endoscopy;  Laterality: N/A;  . ESOPHAGOGASTRODUODENOSCOPY  11/06/2011   [name]: MILD Esophagitis/PATENT ESOPHAGEAL Stricture/  Moderate gastritis. Bx no.hpylori or celiac, +gastritis  . ESOPHAGOGASTRODUODENOSCOPY (EGD) WITH PROPOFOL N/A 03/20/2014   [name]: 1. Mild esophagitis & distal esophagela stricture. 2. small hiatal hernia 3. moderate non-erosive gastritis and mild duodenits  . ESOPHAGOGASTRODUODENOSCOPY (EGD) WITH PROPOFOL N/A 11/21/2015   Dr. Jena Gaussourk: LA grade B esophagitis, MW tear likely source of hematemesis  . SAVORY DILATION N/A 03/20/2014   Procedure: SAVORY DILATION;  Surgeon: West BaliSandi L Fields, MD;  Location: [facility] ORS;  Service: Endoscopy;  Laterality: N/A;  dilated with # 12.8, 14,15,16  . TOOTH EXTRACTION      Current Outpatient Prescriptions  Medication Sig Dispense Refill  . diphenhydrAMINE (BENADRYL) 25 mg capsule Take 25 mg by mouth every 6 (six)  hours as needed.    . metoprolol tartrate (LOPRESSOR) 25 MG tablet Take 12.5 mg by mouth as needed (for palpitations). Reported on 08/28/2015    . MILK THISTLE PO Take by mouth 3 (three) times daily.    . Multiple Vitamin (MULTIVITAMIN) tablet Take 1 tablet by mouth daily.    . Multiple Vitamins-Minerals (HAIR SKIN NAILS PO) Take by mouth daily.    Marland Kitchen spironolactone (ALDACTONE) 50 MG tablet TAKE 1 TABLET BY MOUTH EVERY DAY 30 tablet 11  . Tetrahydrozoline-Zn Sulfate (ALLERGY RELIEF EYE DROPS OP)  Apply 2 drops to eye daily.    . ursodiol (URSO FORTE) 500 MG tablet Take 1 tablet (500 mg total) by mouth 2 (two) times daily. 60 tablet 3   No current facility-administered medications for this visit.     Allergies as of 12/14/2016 - Review Complete 12/14/2016  Allergen Reaction Noted  . Penicillins Anaphylaxis 10/18/2011  . Lidocaine Other (See Comments) 06/22/2014    Family History  Problem Relation Age of Onset  . Stomach cancer Paternal Grandfather        colon cancer  . Cancer Paternal Grandfather        throat and esophagus  . Breast cancer Maternal Grandmother   . Cancer Maternal Grandmother        skin  . Anxiety disorder Maternal Grandmother   . Hypertension Mother   . Other Mother        fatty liver  . Hyperlipidemia Mother   . Liver disease Mother        fatty liver, does not drink.   . Other Father        varicose veins; stomach issues; hernia  . Hypertension Father   . Hyperlipidemia Father   . Cancer Father        prostate  . Arthritis Father        rheumatoid  . Thyroid disease Sister   . Other Brother        hernia  . Diabetes Maternal Grandfather   . Other Paternal Grandmother        hernia  . COPD Paternal Grandmother     Social History   Social History  . Marital status: Legally Separated    Spouse name: N/A  . Number of children: 0  . Years of education: N/A   Occupational History  . was at [employer] but not working now    Social History Main Topics  . Smoking status: Current Some Day Smoker    Types: Cigarettes  . Smokeless tobacco: Never Used  . Alcohol use No     Comment: h/o etoh abuse, quit early 2017 but started back summer 2017.; denied 12/14/16  . Drug use: No  . Sexual activity: Yes    Birth control/ protection: None   Other Topics Concern  . None   Social History Narrative   Lives w/ grandfather or parents or family member (moved from [place] 1 month)   Previous MD: Phill Mutter, NP ([place], [place])          Review of  Systems: Gen: Denies fever, chills, anorexia. Denies fatigue, weakness, weight loss.  CV: Denies chest pain, palpitations, syncope, peripheral edema, and claudication. Resp: Denies dyspnea at rest, cough, wheezing, coughing up blood, and pleurisy. GI: see HPI  Derm: Denies rash, itching, dry skin Psych: Denies depression, anxiety, memory loss, confusion. No homicidal or suicidal ideation.  Heme: Denies bruising, bleeding, and enlarged lymph nodes.  Physical Exam: BP 102/61  Pulse 89   Temp (!) 97.1 F (36.2 C) (Oral)   Ht 5' 4.5" (1.638 m)   Wt 152 lb 3.2 oz (69 kg)   LMP 11/18/2016   BMI 25.72 kg/m  General:   Alert and oriented. No distress noted. Pleasant and cooperative.  Head:  Normocephalic and atraumatic. Eyes:  Conjuctiva clear without scleral icterus. Mouth:  Oral mucosa pink and moist.  Abdomen:  +BS, distended but soft. +HSM Msk:  Symmetrical without gross deformities. Normal posture. Extremities:  Without edema. Neurologic:  Alert and  oriented x4 Psych:  Alert and cooperative. Normal mood and affect.  Lab Results  Component Value Date   ALT 43 (H) 11/03/2016   AST 37 (H) 11/03/2016   ALKPHOS 87 11/03/2016   BILITOT 4.1 (H) 11/03/2016   Lab Results  Component Value Date   WBC 11.0 (H) 10/05/2016   HGB 13.0 10/05/2016   HCT 40.2 10/05/2016   MCV 107.2 (H) 10/05/2016   PLT 283 10/05/2016   Lab Results  Component Value Date   CREATININE 0.60 11/03/2016   BUN 9 11/03/2016   NA 138 11/03/2016   K 3.6 11/03/2016   CL 106 11/03/2016   CO2 21 11/03/2016   Lab Results  Component Value Date   INR 1.2 (H) 11/03/2016   INR 1.2 (H) 10/20/2016   INR 1.5 (H) 10/08/2016

## 2016-12-14 NOTE — Patient Instructions (Addendum)
I am rechecking your labs now.   I have ordered a paracentesis. There may not be enough fluid to remove. Do not take aldactone or any other fluid pills the day of the paracentesis.   We may need to adjust your fluid pills.  We will see if you are immune to Hepatitis B. You will still need hepatitis A vaccination. I will make sure Dr. Delton SeeNelson knows this. I am not doing a prescription for this today until we get the blood work back.   Continue the low salt diet. Great job on avoiding alcohol! I am so proud of you.  We will see you in 6-8 weeks.

## 2016-12-15 ENCOUNTER — Encounter: Payer: Self-pay | Admitting: Gastroenterology

## 2016-12-16 NOTE — Assessment & Plan Note (Signed)
41 year old who has continued to abstain from ETOH since May 2018, known severe hepatic steatosis, HSM, possible early cirrhosis. Treating with URSO due to elevated AMA. LFTs slowly improving. Concern for recurrent non-tense ascites on exam: patient feels uncomfortable and requesting paracentesis. This is likely secondary to lack of dietary restrictions while incarcerated the past month. Continue on aldactone 50 mg daily and consider adding low dose lasix after review of labs. Strictly follow 2 gram sodium diet. Assess Hep B status: needs Hep A vaccination, will assess Hep B status. I note that ceruloplasmin was low normal, right at 20. Recheck this today along with CBC, CMP, INR, ceruloplasmin. As she is so young, low threshold for referring to establish care with [facility] in [place]. Return for close follow-up in 6-8 weeks and consider early interval EGD to assess for stigmata of portal hypertension as she has had such as drastic change in health status over past year.

## 2016-12-16 NOTE — Progress Notes (Signed)
NO PCP PER PATIENT °

## 2016-12-17 ENCOUNTER — Ambulatory Visit (HOSPITAL_COMMUNITY)
Admission: RE | Admit: 2016-12-17 | Discharge: 2016-12-17 | Disposition: A | Discharge status: Home / Self Care | Payer: Medicaid Other | Source: Ambulatory Visit | Attending: Gastroenterology | Admitting: Gastroenterology

## 2016-12-17 ENCOUNTER — Other Ambulatory Visit: Payer: Self-pay | Admitting: Gastroenterology

## 2016-12-17 DIAGNOSIS — K7011 Alcoholic hepatitis with ascites: Secondary | ICD-10-CM

## 2016-12-17 NOTE — Progress Notes (Signed)
Patient ID: Kara Stewart, female   DOB: 09-06-1975, 41 y.o.   MRN: [id number]   Scheduled for US Paracentesis today  All 4 quadrants visualized today NO FLUID/ASCITES NOTED  No paracentesis performed today  Pt to home

## 2016-12-24 LAB — CBC WITH DIFFERENTIAL/PLATELET
Basophils Absolute: 40 cells/uL (ref 0–200)
Basophils Relative: 1 %
Eosinophils Absolute: 312 cells/uL (ref 15–500)
Eosinophils Relative: 7.8 %
HCT: 35.6 % (ref 35.0–45.0)
Hemoglobin: 12 g/dL (ref 11.7–15.5)
Lymphs Abs: 724 cells/uL — ABNORMAL LOW (ref 850–3900)
MCH: 29.9 pg (ref 27.0–33.0)
MCHC: 33.7 g/dL (ref 32.0–36.0)
MCV: 88.8 fL (ref 80.0–100.0)
MPV: 10.2 fL (ref 7.5–12.5)
Monocytes Relative: 6 %
NEUTROS PCT: 67.1 %
Neutro Abs: 2684 cells/uL (ref 1500–7800)
PLATELETS: 97 10*3/uL — AB (ref 140–400)
RBC: 4.01 10*6/uL (ref 3.80–5.10)
RDW: 13.3 % (ref 11.0–15.0)
TOTAL LYMPHOCYTE: 18.1 %
WBC mixed population: 240 cells/uL (ref 200–950)
WBC: 4 10*3/uL (ref 3.8–10.8)

## 2016-12-24 LAB — COMPLETE METABOLIC PANEL WITH GFR
AG Ratio: 1.3 (calc) (ref 1.0–2.5)
ALKALINE PHOSPHATASE (APISO): 83 U/L (ref 33–115)
ALT: 9 U/L (ref 6–29)
AST: 18 U/L (ref 10–30)
Albumin: 3.4 g/dL — ABNORMAL LOW (ref 3.6–5.1)
BUN: 7 mg/dL (ref 7–25)
CALCIUM: 8.9 mg/dL (ref 8.6–10.2)
CO2: 23 mmol/L (ref 20–32)
Chloride: 109 mmol/L (ref 98–110)
Creat: 0.74 mg/dL (ref 0.50–1.10)
GFR, EST NON AFRICAN AMERICAN: 101 mL/min/{1.73_m2} (ref >=60)
GFR, Est African American: 117 mL/min/{1.73_m2} (ref >=60)
GLOBULIN: 2.7 g/dL (ref 1.9–3.7)
Glucose, Bld: 93 mg/dL (ref 65–139)
POTASSIUM: 3.9 mmol/L (ref 3.5–5.3)
SODIUM: 138 mmol/L (ref 135–146)
Total Bilirubin: 2 mg/dL — ABNORMAL HIGH (ref 0.2–1.2)
Total Protein: 6.1 g/dL (ref 6.1–8.1)

## 2016-12-24 LAB — CERULOPLASMIN: Ceruloplasmin: 22 mg/dL (ref 18–53)

## 2016-12-24 LAB — PROTIME-INR
INR: 1.3 — ABNORMAL HIGH
PROTHROMBIN TIME: 13.3 s — AB (ref 9.0–11.5)

## 2016-12-24 LAB — HEPATITIS B SURFACE ANTIBODY,QUALITATIVE: HEP B S AB: NONREACTIVE

## 2016-12-28 ENCOUNTER — Other Ambulatory Visit: Payer: Self-pay | Admitting: Gastroenterology

## 2016-12-28 ENCOUNTER — Telehealth: Payer: Self-pay

## 2016-12-28 ENCOUNTER — Other Ambulatory Visit: Payer: Self-pay

## 2016-12-28 DIAGNOSIS — K7010 Alcoholic hepatitis without ascites: Secondary | ICD-10-CM

## 2016-12-28 MED ORDER — FUROSEMIDE 20 MG PO TABS: 20.0000 mg | ORAL_TABLET | Freq: Every day | ORAL | 3 refills | Status: DC

## 2016-12-28 NOTE — Progress Notes (Signed)
MELD 12. Bilirubin much improved and now 2.0. Transaminases normal. She has progressed well. Ceruloplasmin is not below 20, so this is good.   1. I am adding Lasix 20 mg daily, which she needs to take along with aldactone 50 mg daily. I have sent lasix to pharmacy. Let's see how this does for her edema. Recheck BMP 1 week. 2. Needs Hep A and B vaccination. I am copying Dr. Delton See on this, who will be seeing her in the future as a patient. Hopefully can get that set up there.  3. Ultrasound without sufficient ascites for paracentesis. Strictly follow low sodium diet. 4. I am very proud of her, as she is continuing to avoid ETOH and her health and labs reflect that.  5. Keep appt with Dr. Delton See in October and me in December.

## 2016-12-28 NOTE — Telephone Encounter (Signed)
Please see result note. US abdomen was just to check for fluid. She didn't have enough for paracentesis. See full result note.

## 2016-12-28 NOTE — Telephone Encounter (Signed)
PT called for her [place] and lab results. I reminded her that we have 7-10 business days to call with results. Forwarding to Lewie Loron, NP who saw pt last.

## 2016-12-28 NOTE — Progress Notes (Signed)
PT is aware and lab order on file for BMET in one week.

## 2016-12-28 NOTE — Telephone Encounter (Signed)
Noted. Pt is aware 

## 2017-01-14 ENCOUNTER — Ambulatory Visit: Payer: Medicaid Other | Admitting: Nurse Practitioner

## 2017-01-25 ENCOUNTER — Ambulatory Visit: Payer: Self-pay | Admitting: Family Medicine

## 2017-01-27 ENCOUNTER — Encounter: Payer: Self-pay | Admitting: Family Medicine

## 2017-01-27 ENCOUNTER — Ambulatory Visit: Payer: Self-pay | Admitting: Family Medicine

## 2017-01-27 ENCOUNTER — Ambulatory Visit (INDEPENDENT_AMBULATORY_CARE_PROVIDER_SITE_OTHER): Payer: Medicaid Other | Admitting: Family Medicine

## 2017-01-27 VITALS — BP 130/80 | HR 65 | Temp 98.1°F | Resp 16 | Ht 65.0 in | Wt 146.0 lb

## 2017-01-27 DIAGNOSIS — E041 Nontoxic single thyroid nodule: Secondary | ICD-10-CM | POA: Diagnosis not present

## 2017-01-27 DIAGNOSIS — E782 Mixed hyperlipidemia: Secondary | ICD-10-CM | POA: Diagnosis not present

## 2017-01-27 DIAGNOSIS — F1011 Alcohol abuse, in remission: Secondary | ICD-10-CM

## 2017-01-27 DIAGNOSIS — Z87898 Personal history of other specified conditions: Secondary | ICD-10-CM

## 2017-01-27 LAB — LIPID PANEL
Cholesterol: 166 mg/dL (ref <200)
HDL: 45 mg/dL — AB (ref >50)
LDL Cholesterol (Calc): 101 mg/dL (calc) — ABNORMAL HIGH
NON-HDL CHOLESTEROL (CALC): 121 mg/dL (ref <130)
TRIGLYCERIDES: 103 mg/dL (ref <150)
Total CHOL/HDL Ratio: 3.7 (calc) (ref <5.0)

## 2017-01-27 LAB — BASIC METABOLIC PANEL
BUN / CREAT RATIO: 10 (calc) (ref 6–22)
BUN: 6 mg/dL — AB (ref 7–25)
CALCIUM: 9.2 mg/dL (ref 8.6–10.2)
CO2: 25 mmol/L (ref 20–32)
Chloride: 106 mmol/L (ref 98–110)
Creat: 0.63 mg/dL (ref 0.50–1.10)
GLUCOSE: 78 mg/dL (ref 65–139)
Potassium: 3.5 mmol/L (ref 3.5–5.3)
Sodium: 138 mmol/L (ref 135–146)

## 2017-01-27 LAB — TSH: TSH: 1.2 mIU/L
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
## 2017-01-28 ENCOUNTER — Encounter: Payer: Self-pay | Admitting: Family Medicine

## 2017-02-04 NOTE — Progress Notes (Signed)
Tried to call. Mail box full. Mailing a letter with the info.

## 2017-02-07 ENCOUNTER — Telehealth: Payer: Self-pay | Admitting: Family Medicine

## 2017-02-07 NOTE — Telephone Encounter (Signed)
-----   Message from Mack HookBreanna Johnson, LPN sent at 16/1/096011/05/2016  9:18 AM EDT ----- Pre-certification attempt for patient's thyroid scan was denied. Do you want to continue with test?

## 2017-02-07 NOTE — Telephone Encounter (Signed)
Please find out why it was denied/who we need to call. She has a history of thyroid nodules. Janine Limboachel H. Tracie HarrierHagler, MD

## 2017-02-08 NOTE — Telephone Encounter (Signed)
Submitted to insurance for additional review.

## 2017-02-11 ENCOUNTER — Ambulatory Visit (HOSPITAL_COMMUNITY): Admission: RE | Admit: 2017-02-11 | Payer: Medicaid Other | Source: Ambulatory Visit

## 2017-03-03 ENCOUNTER — Ambulatory Visit (INDEPENDENT_AMBULATORY_CARE_PROVIDER_SITE_OTHER): Payer: Medicaid Other | Admitting: Family Medicine

## 2017-03-03 ENCOUNTER — Encounter: Payer: Self-pay | Admitting: Family Medicine

## 2017-03-03 ENCOUNTER — Other Ambulatory Visit: Payer: Self-pay

## 2017-03-03 VITALS — BP 110/60 | HR 84 | Temp 98.4°F | Resp 16 | Ht 65.0 in | Wt 139.8 lb

## 2017-03-03 DIAGNOSIS — Z3200 Encounter for pregnancy test, result unknown: Secondary | ICD-10-CM

## 2017-03-03 DIAGNOSIS — K068 Other specified disorders of gingiva and edentulous alveolar ridge: Secondary | ICD-10-CM | POA: Diagnosis not present

## 2017-03-03 DIAGNOSIS — J029 Acute pharyngitis, unspecified: Secondary | ICD-10-CM

## 2017-03-03 DIAGNOSIS — J0100 Acute maxillary sinusitis, unspecified: Secondary | ICD-10-CM

## 2017-03-03 LAB — POCT RAPID STREP A (OFFICE): Rapid Strep A Screen: NEGATIVE

## 2017-03-03 LAB — POCT URINE PREGNANCY: Preg Test, Ur: NEGATIVE

## 2017-03-03 MED ORDER — DOXYCYCLINE HYCLATE 100 MG PO TABS: 100.0000 mg | ORAL_TABLET | Freq: Two times a day (BID) | ORAL | 0 refills | Status: DC

## 2017-03-03 NOTE — Progress Notes (Signed)
Patient ID: Kara FleetStephanie Stitt, female    DOB: Nov 14, 1975, 41 y.o.   MRN: [id number]  Chief Complaint  Patient presents with  . Follow-up    Allergies Penicillins and Lidocaine  Subjective:   Kara Stewart is a 41 y.o. female who [facility].  HPI [name] presents today for follow-up.  She reports that she was going to get her Twinrix shot today but she has not been feeling well.  Reports she was seen at her gynecologist yesterday and they performed a blood pregnancy test because she had missed her menses.  She reports she has not heard back regarding the test.  Reports that for the past week she has been feeling poorly.  Has had bilateral sinus pain and pressure.  Reports that her teeth have been hurting.  Reports she brushes her teeth this morning and her gums bled a lot.  Reports she is concerned about this.  Has had a sore throat on and off for the past week.  Mild cough.  No sputum production.  Denies any shortness of breath.  Denies any fevers or myalgias.  Eating well and drinking well.  No abdominal pain or dysuria.  No nausea vomiting or diarrhea.  Has not had any skin rashes.  Denies any alcohol use.  Denies any hemoptysis or nosebleeds.    Past Medical History:  Diagnosis Date  . Acid reflux   . Alcohol abuse 07/21/2012  . 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 Anxiety   . Asthma   . BV (bacterial vaginosis) 04/26/2015  . Colitis   . Dyspepsia 2004   Dx w/ PUD but no EGD, [place],    . Hiatal hernia   . History of alcohol abuse 04/26/2015  . History of colitis 11/29/2014  . Hypertension   . Hypertriglyceridemia   . Hypothyroid   . Iritis    frequent  . Irregular periods 01/11/2014  . Ovarian cyst   . Panic attacks   . RLQ abdominal pain 01/11/2014  . SVT (supraventricular tachycardia) (HCC)   . Thyroid nodule   . Trichomonal vaginitis 01/11/2014  . Vaginal discharge 01/11/2014  . Weight gain 01/11/2014    Past Surgical History:    Procedure Laterality Date  . BIOPSY N/A 02/05/2015   Procedure: BIOPSY;  Surgeon: West BaliSandi L Fields, MD;  Location: [facility];  Service: Endoscopy;  Laterality: N/A;  . COLONOSCOPY    . COLONOSCOPY WITH PROPOFOL N/A 02/05/2015   WUJ:WJXBJSLF:small HH/mild diverticulosis  . ESOPHAGEAL DILATION N/A 11/21/2015   Procedure: ESOPHAGEAL DILATION;  Surgeon: Corbin Adeobert M Rourk, MD;  Location: [facility];  Service: Endoscopy;  Laterality: N/A;  . ESOPHAGOGASTRODUODENOSCOPY  11/06/2011   [name]: MILD Esophagitis/PATENT ESOPHAGEAL Stricture/  Moderate gastritis. Bx no.hpylori or celiac, +gastritis  . ESOPHAGOGASTRODUODENOSCOPY (EGD) WITH PROPOFOL N/A 03/20/2014   [name]: 1. Mild esophagitis & distal esophagela stricture. 2. small hiatal hernia 3. moderate non-erosive gastritis and mild duodenits  . ESOPHAGOGASTRODUODENOSCOPY (EGD) WITH PROPOFOL N/A 11/21/2015   Dr. Jena Gaussourk: LA grade B esophagitis, MW tear likely source of hematemesis  . SAVORY DILATION N/A 03/20/2014   Procedure: SAVORY DILATION;  Surgeon: West BaliSandi L Fields, MD;  Location: [facility];  Service: Endoscopy;  Laterality: N/A;  dilated with # 12.8, 14,15,16  . TOOTH EXTRACTION      Family History  Problem Relation Age of Onset  . Stomach cancer Paternal Grandfather        colon cancer  . Cancer Paternal Grandfather  throat and esophagus  . Breast cancer Maternal Grandmother   . Cancer Maternal Grandmother        skin  . Anxiety disorder Maternal Grandmother   . Hypertension Mother   . Other Mother        fatty liver  . Hyperlipidemia Mother   . Liver disease Mother        fatty liver, does not drink.   . Other Father        varicose veins; stomach issues; hernia  . Hypertension Father   . Hyperlipidemia Father   . Cancer Father        prostate  . Arthritis Father        rheumatoid  . Thyroid disease Sister   . Other Brother        hernia  . Diabetes Maternal Grandfather   . Heart disease Maternal Grandfather   . Other Paternal Grandmother         hernia  . COPD Paternal Grandmother   . Diabetes Paternal Grandmother      Social History   Socioeconomic History  . Marital status: Legally Separated    Spouse name: None  . Number of children: 0  . Years of education: None  . Highest education level: None  Social Needs  . Financial resource strain: None  . Food insecurity - worry: None  . Food insecurity - inability: None  . Transportation needs - medical: None  . Transportation needs - non-medical: None  Occupational History  . Occupation: was at [employer] but not working now  Tobacco Use  . Smoking status: Former Smoker    Types: Cigarettes  . Smokeless tobacco: Never Used  Substance and Sexual Activity  . Alcohol use: No    Alcohol/week: 0.0 oz    Comment: h/o etoh abuse, quit early 2017 but started back summer 2017.; denied 12/14/16  . Drug use: No  . Sexual activity: Yes    Birth control/protection: None  Other Topics Concern  . None  Social History Narrative   Lives alone with a roommate. Dating and in safe relationship. Does not smoke. Denies drug use.    Previous MD: Phill Mutter, NP ([place], [place])       Review of Systems   Objective:   BP 110/60 (BP Location: Left Arm, Patient Position: Sitting, Cuff Size: Normal)   Pulse 84   Temp 98.4 F (36.9 C) (Other (Comment))   Resp 16   Ht 5\' 5"  (1.651 m)   Wt 139 lb 12 oz (63.4 kg)   SpO2 100%   BMI 23.26 kg/m   Physical Exam  HENT:  Right Ear: External ear normal.  Left Ear: External ear normal.  Nose: Nose normal.  Mouth/Throat: Uvula is midline, oropharynx is clear and moist and mucous membranes are normal. Mucous membranes are not pale. No oral lesions. Normal dentition. No dental abscesses or uvula swelling. No oropharyngeal exudate or posterior oropharyngeal edema. No tonsillar exudate.  Pharyngeal hyperemia without exudate.  Eyes: Conjunctivae and EOM are normal. Pupils are equal, round, and reactive to light. No scleral icterus.  Neck: Normal range of  motion. Neck supple.  Right-sided tender submandibular gland, slightly enlarged.  Cardiovascular: Normal rate and regular rhythm.  Pulmonary/Chest: Effort normal and breath sounds normal.  Skin: Skin is warm and dry.  No bruising or skin petechiae noted.     Assessment and Plan   1. Bleeding gums We will check CBC today secondary to the fact the patient's gums were  bleeding in light of her chronic liver disease.  She was counseled concerning worrisome signs and symptoms of bleeding and if it recurs to go to the emergency department or call 911. - CBC  2. Pharyngitis, unspecified etiology Rapid strep test negative.  Suspect viral etiology.  Supportive care encouraged. - POCT rapid strep A  3. Possible pregnancy Pregnancy test in the office performed and negative.  Patient will get results of hCG blood test today before starting antibiotics. - POCT urine pregnancy 4.  Sinusitis Patient is anaphylactic allergic to penicillin and cephalosporins.  Will treat with doxycycline 100 mg 1 p.o. twice daily times 7 days.  Patient was counseled that if her symptoms do not improve or worsen to please call or return to office.  Patient will follow-up and at that time we will start Twinrix series.  Return in about 4 weeks (around 03/31/2017). Aliene Beamsachel Loni Abdon, MD 03/03/2017

## 2017-03-15 ENCOUNTER — Telehealth: Payer: Self-pay

## 2017-03-15 NOTE — Telephone Encounter (Signed)
Tried to call pt about her appt tomorrow 03/16/17. The office will be closed d/t the weather. The pt did not answer the phone but I was able to leave a message. I have cancelled her appt. Please reschedule.

## 2017-03-16 ENCOUNTER — Ambulatory Visit: Payer: Medicaid Other | Admitting: Gastroenterology

## 2017-03-17 ENCOUNTER — Encounter: Payer: Self-pay | Admitting: Gastroenterology

## 2017-03-17 NOTE — Telephone Encounter (Signed)
PATIENT RESCHEDULED

## 2017-03-31 ENCOUNTER — Other Ambulatory Visit: Payer: Self-pay | Admitting: Nurse Practitioner

## 2017-03-31 DIAGNOSIS — K769 Liver disease, unspecified: Secondary | ICD-10-CM

## 2017-05-12 ENCOUNTER — Telehealth: Payer: Self-pay | Admitting: Gastroenterology

## 2017-05-12 NOTE — Telephone Encounter (Signed)
Kara Stewart, pt has appt with you on 05/17/2017. Please advise if she needs labs prior to OV.

## 2017-05-12 NOTE — Telephone Encounter (Signed)
Pt is aware.  

## 2017-05-12 NOTE — Telephone Encounter (Signed)
[phone] please call patient, she was inquiring about if she could go ahead and have her labs done now instead of waiting for her appointment to receive the lab request?

## 2017-05-12 NOTE — Telephone Encounter (Signed)
OV first?

## 2017-05-17 ENCOUNTER — Telehealth: Payer: Self-pay | Admitting: Gastroenterology

## 2017-05-17 ENCOUNTER — Encounter: Payer: Self-pay | Admitting: Gastroenterology

## 2017-05-17 ENCOUNTER — Ambulatory Visit: Payer: Medicaid Other | Admitting: Gastroenterology

## 2017-05-17 NOTE — Telephone Encounter (Signed)
Patient was a no show and letter sent  °

## 2017-06-21 ENCOUNTER — Other Ambulatory Visit: Payer: Self-pay

## 2017-06-21 ENCOUNTER — Encounter: Payer: Self-pay | Admitting: Family Medicine

## 2017-06-21 ENCOUNTER — Ambulatory Visit (INDEPENDENT_AMBULATORY_CARE_PROVIDER_SITE_OTHER): Payer: Medicaid Other | Admitting: Family Medicine

## 2017-06-21 VITALS — BP 114/70 | HR 82 | Temp 98.7°F | Ht 64.5 in | Wt 141.1 lb

## 2017-06-21 DIAGNOSIS — N926 Irregular menstruation, unspecified: Secondary | ICD-10-CM

## 2017-06-21 DIAGNOSIS — M545 Low back pain, unspecified: Secondary | ICD-10-CM

## 2017-06-21 DIAGNOSIS — R109 Unspecified abdominal pain: Secondary | ICD-10-CM | POA: Diagnosis not present

## 2017-06-21 DIAGNOSIS — K709 Alcoholic liver disease, unspecified: Secondary | ICD-10-CM | POA: Diagnosis not present

## 2017-06-21 LAB — POCT URINALYSIS DIPSTICK
BILIRUBIN UA: NEGATIVE
Glucose, UA: NEGATIVE
KETONES UA: NEGATIVE
Leukocytes, UA: NEGATIVE
NITRITE UA: NEGATIVE
PH UA: 5.5 (ref 5.0–8.0)
PROTEIN UA: NEGATIVE
RBC UA: NEGATIVE
Spec Grav, UA: >=1.03 — AB (ref 1.010–1.025)
Urobilinogen, UA: 1 E.U./dL

## 2017-06-21 LAB — POCT URINE PREGNANCY: Preg Test, Ur: NEGATIVE

## 2017-06-21 MED ORDER — IBUPROFEN 800 MG PO TABS: 800.0000 mg | ORAL_TABLET | Freq: Three times a day (TID) | ORAL | 0 refills | Status: DC | PRN

## 2017-06-21 MED ORDER — CYCLOBENZAPRINE HCL 5 MG PO TABS: 5.0000 mg | ORAL_TABLET | Freq: Every day | ORAL | 1 refills | Status: DC

## 2017-06-21 NOTE — Progress Notes (Signed)
Chief Complaint  Patient presents with  . Back Pain  . Abdominal Pain    lower right quadrant pain   Patient came in as a walk-in late in the afternoon to be seen for right lower quadrant pain and low back pain.  She states she googled her symptoms and is afraid she has a ectopic pregnancy.  She states she does not have a history of back problems.  No falls or injuries.  No overuse.  The pain is in the right lower back, points to her SI region.  She is also had some crampy right lower quadrant pain.  No nausea no vomiting.  No fever.  No diarrhea or change in bowels.  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  She developed this pain on Friday, she states it was worse on Saturday.  She states the pain has been so bad that it "made me pass out".  For this pain the only thing she is taken is Tylenol.  She will take # 3 , 500 mg Tylenol at a time.  I have told her that with liver disease one must be cautious with the use of Tylenol.  I recommend she call her gastroenterologist to see what safe over-the-counter medications can be used.  The pain is localized to her right low back, no radiation.  No muscle spasm.  It is worse with movement when she first gets up in the morning. I had a discussion with the patient that I did not think it was safe for her to proceed with her intention to try to get pregnant.  She told me her biologic clock is ticking.  I understand that she is 68, however she has alcoholic liver disease with a low platelet count and high INR, high bilirubin and liver functions.  I do not think pregnancy is advisable without GYN consultation.  I told her that in my opinion it would be a high risk pregnancy.  She is defensive, states that she has already been to a gynecologist, and that her doctor told her she is "in perfect health".  This is not reflected in the medical record.  Her last GYN visit, fall 2018, indicates  contraceptive was discussed.   Patient Active Problem List   Diagnosis Date Noted  . Rash and nonspecific skin eruption 10/21/2016  . Alcoholic hepatitis with ascites 10/05/2016  . Mallory-Weiss tear   . Hematemesis 11/21/2015  . Hematemesis without nausea   . Esophageal dysphagia   . Alcoholic hepatitis without ascites   . BV (bacterial vaginosis) 04/26/2015  . History of alcohol abuse 04/26/2015  . Alcohol withdrawal (HCC) 01/20/2015  . Marijuana abuse 01/20/2015  . Abnormal CT scan, colon 01/08/2015  . Ovarian cyst 11/29/2014  . Anxiety 11/29/2014  . History of colitis 11/29/2014  . ETOH abuse 11/20/2014  . RUQ pain 08/27/2014  . Abdominal pain 08/07/2014  . Dysphagia, idiopathic 03/05/2014  . Thyroid nodule 01/11/2014  . RLQ abdominal pain 01/11/2014  . Vaginal discharge 01/11/2014  . Trichomonal vaginitis 01/11/2014  . Weight gain 01/11/2014  . Irregular periods 01/11/2014  . SVT (supraventricular tachycardia) (HCC) 09/07/2012  . Obesity (BMI 30-39.9) 09/07/2012  . EBV infection 08/11/2012  . Orthostatic hypotension 08/08/2012  . GERD (gastroesophageal reflux disease) 07/21/2012  . Alcohol abuse 07/21/2012  . Diarrhea 07/21/2012  . Upper abdominal pain 10/28/2011  . Elevated LFTs- negative HIDA 10/28/2011  . Fatty liver 10/28/2011  Outpatient Encounter Medications as of 06/21/2017  Medication Sig  . diphenhydrAMINE (BENADRYL) 25 mg capsule Take 25 mg by mouth every 6 (six) hours as needed.  . Multiple Vitamin (MULTIVITAMIN) tablet Take 1 tablet by mouth daily.  . Multiple Vitamins-Minerals (HAIR SKIN NAILS PO) Take by mouth daily.  Marland Kitchen. spironolactone (ALDACTONE) 50 MG tablet TAKE 1 TABLET BY MOUTH EVERY DAY  . ursodiol (ACTIGALL) 500 MG tablet TAKE 1 TABLET BY MOUTH TWICE A DAY  . cyclobenzaprine (FLEXERIL) 5 MG tablet Take 1 tablet (5 mg total) by mouth at bedtime.  Marland Kitchen. doxycycline (VIBRA-TABS) 100 MG tablet Take 1 tablet (100 mg total) by mouth 2 (two) times  daily. (Patient not taking: Reported on 06/21/2017)  . furosemide (LASIX) 20 MG tablet Take 1 tablet (20 mg total) by mouth daily. (Patient not taking: Reported on 06/21/2017)  . ibuprofen (ADVIL,MOTRIN) 800 MG tablet Take 1 tablet (800 mg total) by mouth every 8 (eight) hours as needed for moderate pain.  . metoprolol tartrate (LOPRESSOR) 25 MG tablet Take 12.5 mg by mouth as needed (for palpitations). Reported on 08/28/2015  . MILK THISTLE PO Take by mouth 3 (three) times daily.  Jeananne Rama. Tetrahydrozoline-Zn Sulfate (ALLERGY RELIEF EYE DROPS OP) Apply 2 drops to eye daily.   No facility-administered encounter medications on file as of 06/21/2017.     Allergies  Allergen Reactions  . Penicillins Anaphylaxis    Has patient had a PCN reaction causing immediate rash, facial/tongue/throat swelling, SOB or lightheadedness with hypotension: yes Has patient had a PCN reaction causing severe rash involving mucus membranes or skin necrosis: No Has patient had a PCN reaction that required hospitalization Yes Has patient had a PCN reaction occurring within the last 10 years: No If all of the above answers are "NO", then may proceed with Cephalosporin use.   . Lidocaine Other (See Comments)    hallucinations    Review of Systems  Constitutional: Negative for activity change, appetite change, fatigue and unexpected weight change.  HENT: Negative for dental problem.   Respiratory: Negative for cough and shortness of breath.   Cardiovascular: Negative for chest pain, palpitations and leg swelling.  Gastrointestinal: Positive for abdominal pain. Negative for abdominal distention, diarrhea, nausea and vomiting.  Genitourinary: Negative for difficulty urinating, flank pain, frequency and vaginal bleeding.  Musculoskeletal: Positive for back pain. Negative for myalgias.  Neurological: Negative for dizziness and headaches.  Psychiatric/Behavioral: Negative for sleep disturbance. The patient is not nervous/anxious.      BP 114/70   Pulse 82   Temp 98.7 F (37.1 C)   Ht 5' 4.5" (1.638 m)   Wt 141 lb 1.9 oz (64 kg)   LMP 04/22/2017 Comment: Feb only had pink when she wiped  SpO2 99%   BMI 23.85 kg/m   Physical Exam  Constitutional: She is oriented to person, place, and time. She appears well-developed and well-nourished.  No acute distress.  Mildly uncomfortable.  HENT:  Head: Normocephalic and atraumatic.  Mouth/Throat: Oropharynx is clear and moist.  Eyes: EOM are normal. Pupils are equal, round, and reactive to light.  Cardiovascular: Normal rate, regular rhythm and normal heart sounds.  Pulmonary/Chest: Effort normal and breath sounds normal. She has no rales.  Abdominal: Soft. Normal appearance and bowel sounds are normal. She exhibits no ascites. There is no hepatosplenomegaly. There is no tenderness. There is no rebound and no guarding.  Neurological: She is alert and oriented to person, place, and time.  Brisk reflexes.  Strength sensation range of  motion reflexes are normal in both lower extremities.  Mild tenderness present right SI area.  No palpable muscle spasm.  Skin: Skin is warm and dry. No rash noted.  Psychiatric: She has a normal mood and affect. Her behavior is normal.  Poor judgment   Results for orders placed or performed in visit on 06/21/17  POCT urine pregnancy  Result Value Ref Range   Preg Test, Ur Negative Negative  POCT urinalysis dipstick  Result Value Ref Range   Color, UA other    Clarity, UA clear    Glucose, UA negative    Bilirubin, UA negative    Ketones, UA negative    Spec Grav, UA >=1.030 (A) 1.010 - 1.025   Blood, UA negative    pH, UA 5.5 5.0 - 8.0   Protein, UA negative    Urobilinogen, UA 1.0 0.2 or 1.0 E.U./dL   Nitrite, UA negative    Leukocytes, UA Negative Negative   Appearance     Odor       ASSESSMENT/PLAN:  1. Acute right-sided low back pain without sciatica   2. Missed period - POCT urine pregnancy   3. Alcoholic liver  disease (HCC)  - COMPLETE METABOLIC PANEL WITH GFR - CBC with Differential/Platelet - Hepatic function panel  4. Right flank pain - POCT urinalysis dipstick   Patient Instructions  Labs today Take the ibuprofen for pain Take the flexeril at night for spasm Use ice or heat to area I will contact your Liver doctor about the status of your condition See a GYN about contraceptive / pregnancy advice  Call if not better in a few days  Work note   Eustace Moore, MD

## 2017-06-21 NOTE — Patient Instructions (Signed)
Labs today Take the ibuprofen for pain Take the flexeril at night for spasm Use ice or heat to area I will contact your Liver doctor about the status of your condition See a GYN about contraceptive / pregnancy advice  Call if not better in a few days  Work note

## 2017-06-23 ENCOUNTER — Other Ambulatory Visit: Payer: Self-pay | Admitting: Family Medicine

## 2017-06-23 ENCOUNTER — Encounter: Payer: Self-pay | Admitting: Family Medicine

## 2017-08-10 ENCOUNTER — Telehealth: Payer: Self-pay | Admitting: Family Medicine

## 2017-08-10 NOTE — Telephone Encounter (Signed)
Needs Office visit for evaluation. Kara Stewart. [name], MD

## 2017-08-10 NOTE — Telephone Encounter (Signed)
Pt is calling in she thought it was allergies, clear drainage, then it turned yellow, now it is Blood when it drips,and when she blows her nose

## 2017-08-11 ENCOUNTER — Encounter: Payer: Self-pay | Admitting: Family Medicine

## 2017-08-11 NOTE — Telephone Encounter (Signed)
Called patient to schedule an appt, no answer, no voicemail, will send letter.

## 2017-08-11 NOTE — Telephone Encounter (Signed)
Spoke with pt, the only available time is Friday (she is on the Sch) However I advised since she is 5 mins away, that I would call her if there was a cancellation today.

## 2017-08-13 ENCOUNTER — Ambulatory Visit: Payer: Self-pay | Admitting: Family Medicine

## 2017-08-19 IMAGING — CT CT ABD-PELV W/ CM
2 of 4 series · 16 of 46 positions shown, 18 images · IV contrast (iopamidol)
Comparison: CT of the abdomen and pelvis from 06/27/2015

CLINICAL DATA: Acute onset of right lower quadrant and right upper
quadrant abdominal pain. Fever and vomiting. Initial encounter.

EXAM:
CT ABDOMEN AND PELVIS WITH CONTRAST
TECHNIQUE: Multidetector CT imaging of the abdomen and pelvis was performed
using the standard protocol following bolus administration of
intravenous contrast.
CONTRAST:  75mL [id number] IOPAMIDOL (S79OLM-XJJ) INJECTION 61%

[Series 2: routine abd pel with · axial · 0.79mm/px · z∈[-434,+6]mm · 13 of 98 slices shown, 15 images]
[im 5/98  soft-tissue]
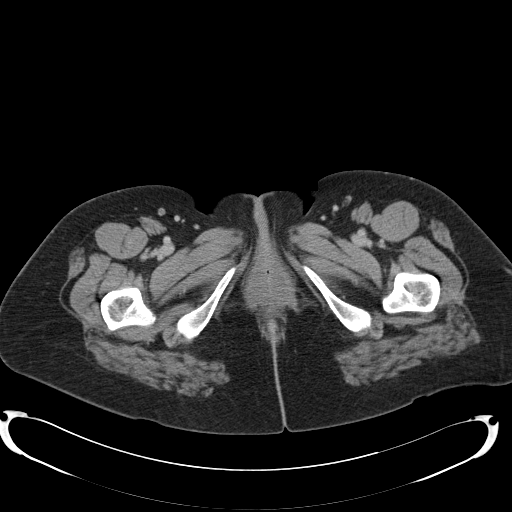
[im 5/98  bone]
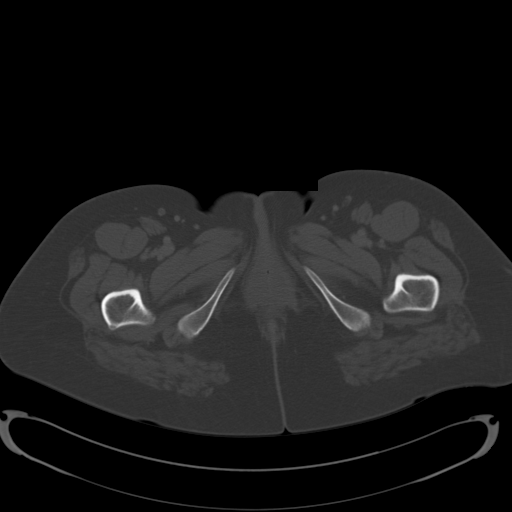
[im 13/98  soft-tissue]
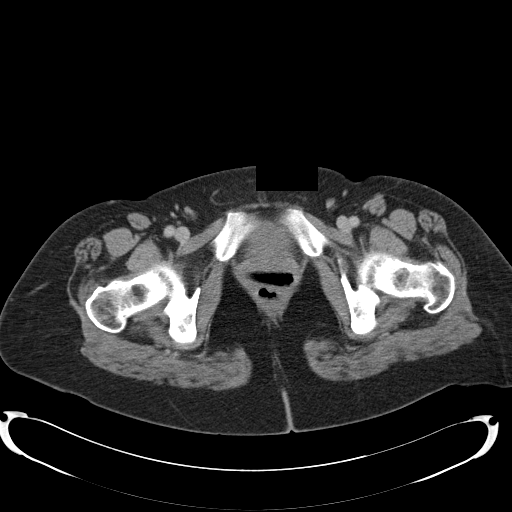
[im 21/98  soft-tissue]
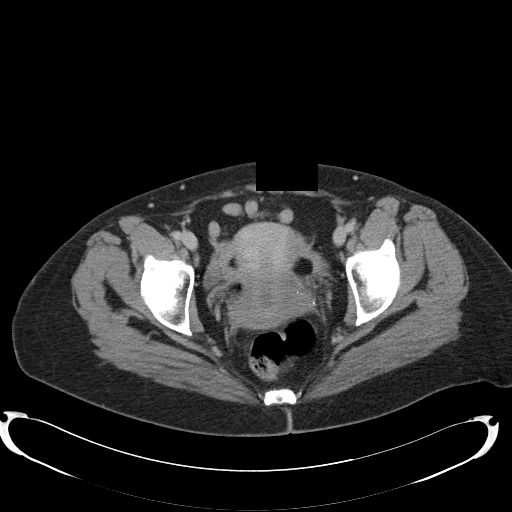
[im 29/98  soft-tissue]
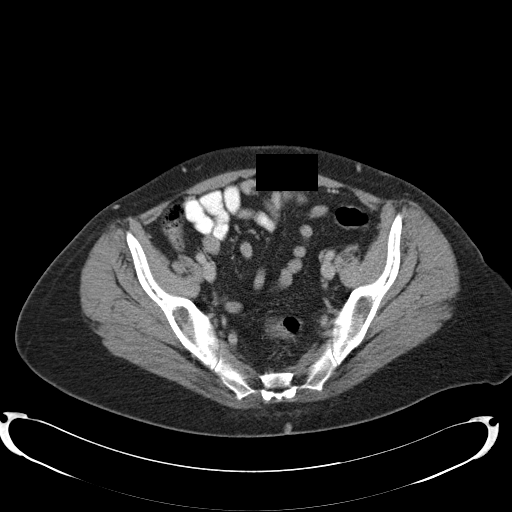
[im 33/98  soft-tissue]
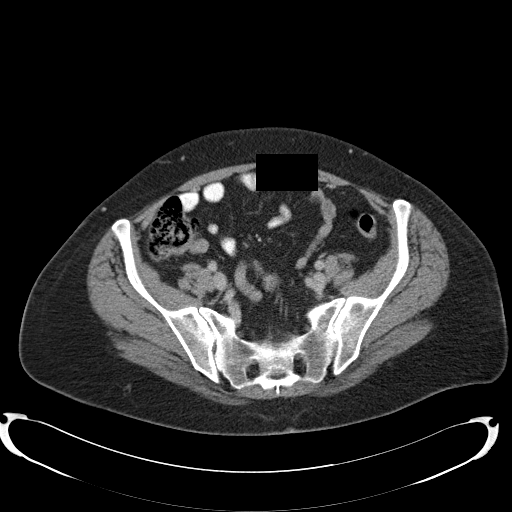
[im 41/98  soft-tissue]
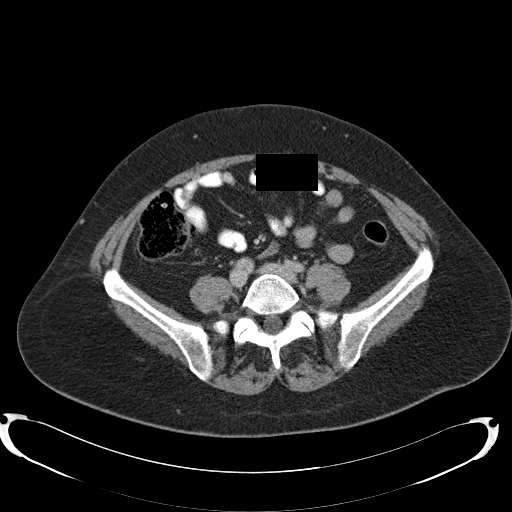
[im 49/98  soft-tissue]
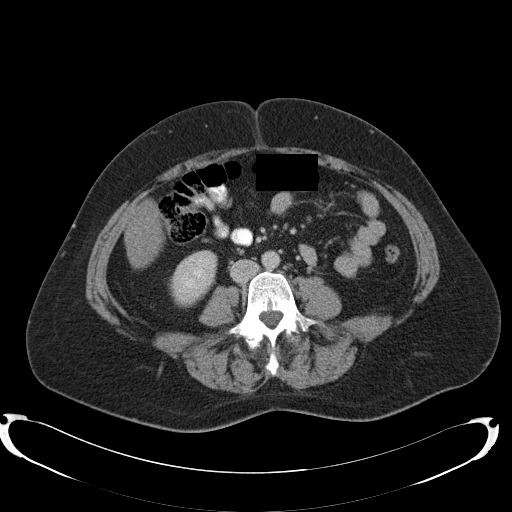
[im 57/98  soft-tissue]
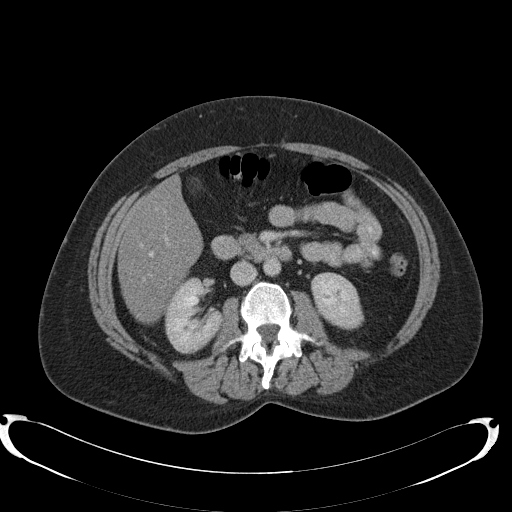
[im 65/98  soft-tissue]
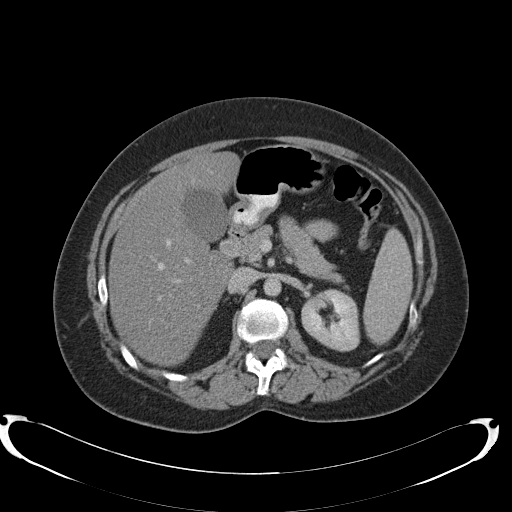
[im 65/98  bone]
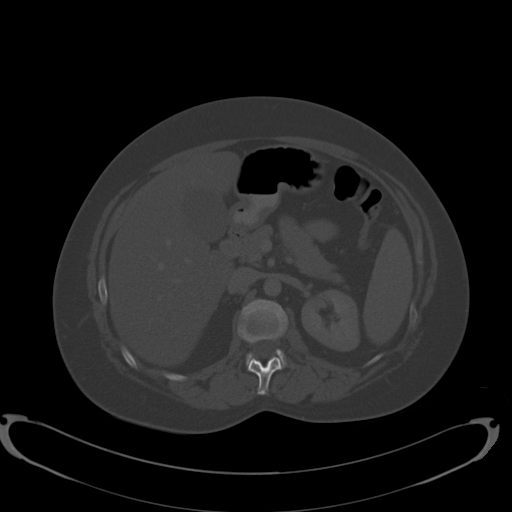
[im 69/98  soft-tissue]
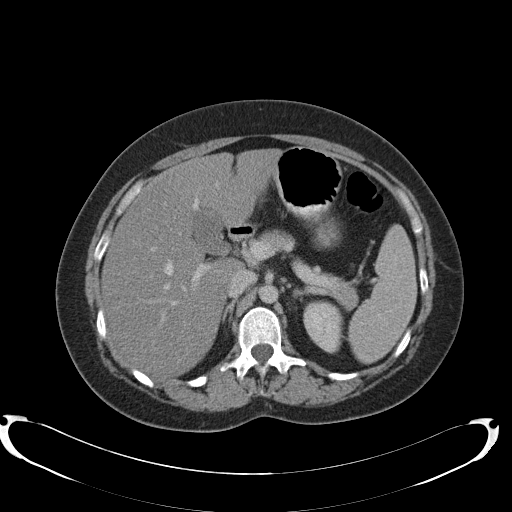
[im 77/98  soft-tissue]
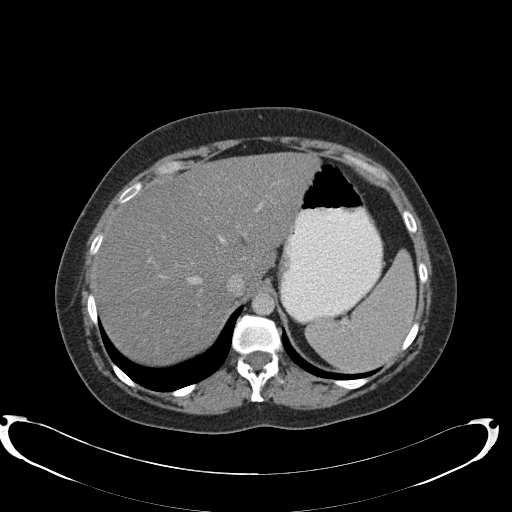
[im 85/98  soft-tissue]
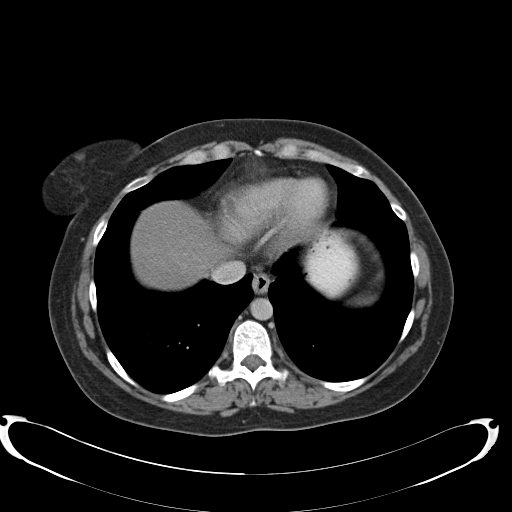
[im 93/98  soft-tissue]
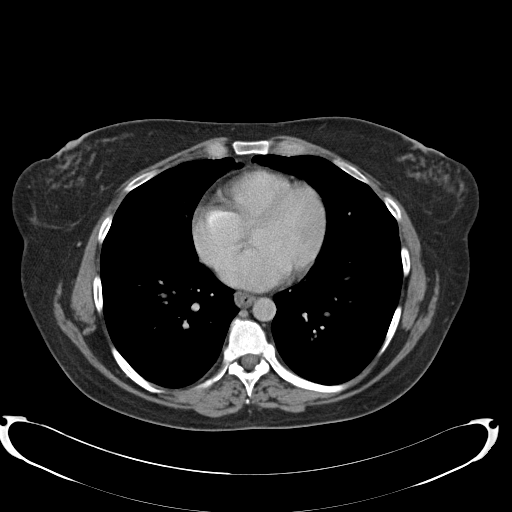

[Series 3: coronal · coronal · 0.80mm/px · 3 of 58 slices shown]
[im 20/58  soft-tissue]
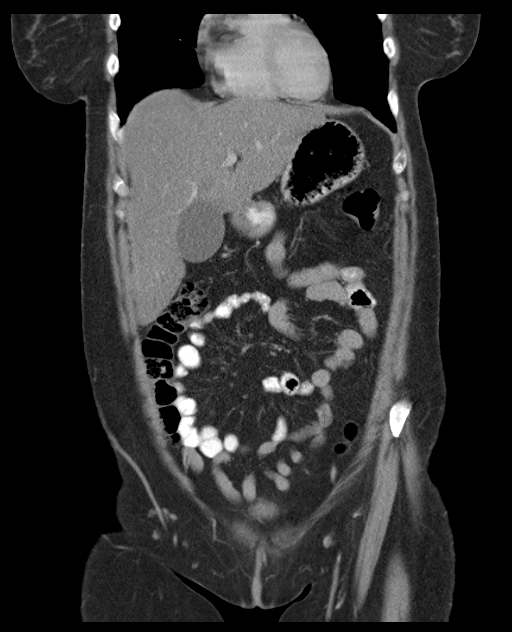
[im 26/58  soft-tissue]
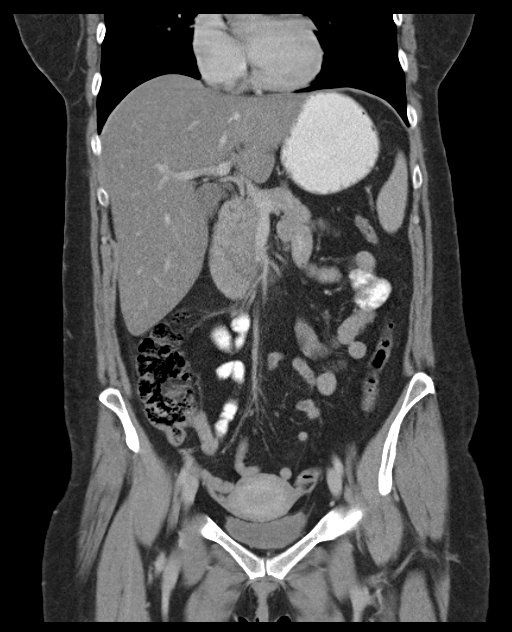
[im 32/58  soft-tissue]
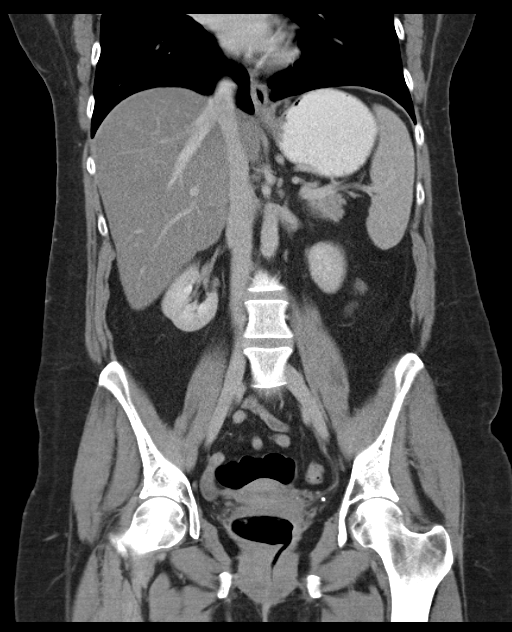

[16 of 46 positions shown; findings below may reference images not displayed]

FINDINGS: The visualized lung bases are clear.

The liver and spleen are unremarkable in appearance. The gallbladder
is within normal limits. The pancreas and adrenal glands are
unremarkable.

The kidneys are unremarkable in appearance. There is no evidence of
hydronephrosis. No renal or ureteral stones are seen. No perinephric
stranding is appreciated.

No free fluid is identified. The small bowel is unremarkable in
appearance. The stomach is within normal limits. No acute vascular
abnormalities are seen.

The appendix is normal in caliber, without evidence for
appendicitis. The colon is grossly unremarkable in appearance.

The bladder is decompressed and not well assessed. The uterus is
grossly unremarkable. A tampon is noted at the vagina. No suspicious
adnexal masses are seen. The ovaries are relatively symmetric. No
inguinal lymphadenopathy is seen.

No acute osseous abnormalities are identified.
IMPRESSION: Unremarkable contrast-enhanced CT of the abdomen and pelvis.

## 2017-08-25 ENCOUNTER — Telehealth: Payer: Self-pay

## 2017-08-25 NOTE — Telephone Encounter (Signed)
Per DPR left detailed message on vm with thyroid ultrasound appt info.   Appt is on 5/28 at 3:30. Needs to be there at 3:15 for registration. If she needs to cancel or reschedule the number to call is [phone]

## 2017-08-26 ENCOUNTER — Telehealth: Payer: Self-pay | Admitting: Family Medicine

## 2017-08-26 NOTE — Telephone Encounter (Signed)
Why does she need this US--please leave detailed msg on machine she is at work.Marland Kitchen

## 2017-08-31 ENCOUNTER — Telehealth: Payer: Self-pay | Admitting: Family Medicine

## 2017-08-31 ENCOUNTER — Ambulatory Visit (HOSPITAL_COMMUNITY)
Admission: RE | Admit: 2017-08-31 | Discharge: 2017-08-31 | Disposition: A | Discharge status: Home / Self Care | Payer: Self-pay | Source: Ambulatory Visit | Attending: Family Medicine | Admitting: Family Medicine

## 2017-08-31 DIAGNOSIS — E041 Nontoxic single thyroid nodule: Secondary | ICD-10-CM | POA: Insufficient documentation

## 2017-08-31 NOTE — Telephone Encounter (Signed)
Per patients request left detailed as to why she needed to have thyroid ultrasound. Ultrasound was to follow up on thyroid nodule.

## 2017-08-31 NOTE — Telephone Encounter (Signed)
Patient called in this morning to ask about her [place] appt today. She is requesting a call back as soon as you can. May not be able to answer phone due to being at work, patient is requesting you leave her a detailed message as to why she is having an US of the thyroid today. Cb#: [phone]

## 2017-08-31 NOTE — Telephone Encounter (Signed)
Thyroid US is to follow up on nodule on thyroid. Left detailed message on patients vm per patient request per patient being at work and unable to answer phone.

## 2017-09-01 ENCOUNTER — Encounter: Payer: Self-pay | Admitting: Family Medicine

## 2017-09-03 ENCOUNTER — Encounter: Payer: Self-pay | Admitting: Family Medicine

## 2017-09-03 ENCOUNTER — Telehealth: Payer: Self-pay | Admitting: Family Medicine

## 2017-09-03 NOTE — Telephone Encounter (Signed)
Letter was sent in the mail with detailed results. Needs scheduled follow to discuss more. Janine Limbo. [name], MD

## 2017-09-03 NOTE — Telephone Encounter (Signed)
Left detailed message regarding thyroid US results per patient request. Encouraged patient to call office and schedule follow up appt to discuss results w/ Dr.Hagler.

## 2017-09-03 NOTE — Telephone Encounter (Signed)
Please call with detail message about her ultrasound.

## 2017-09-10 ENCOUNTER — Encounter: Payer: Self-pay | Admitting: Family Medicine

## 2017-09-21 ENCOUNTER — Telehealth: Payer: Self-pay | Admitting: Family Medicine

## 2017-09-21 NOTE — Telephone Encounter (Signed)
Returned pts call, and lvm

## 2017-10-14 ENCOUNTER — Other Ambulatory Visit: Payer: Self-pay

## 2017-10-14 ENCOUNTER — Ambulatory Visit (INDEPENDENT_AMBULATORY_CARE_PROVIDER_SITE_OTHER): Payer: Self-pay | Admitting: Family Medicine

## 2017-10-14 ENCOUNTER — Encounter: Payer: Self-pay | Admitting: Family Medicine

## 2017-10-14 VITALS — BP 110/70 | HR 82 | Temp 98.8°F | Resp 16 | Ht 63.5 in | Wt 144.0 lb

## 2017-10-14 DIAGNOSIS — O26611 Liver and biliary tract disorders in pregnancy, first trimester: Secondary | ICD-10-CM | POA: Diagnosis not present

## 2017-10-14 DIAGNOSIS — N912 Amenorrhea, unspecified: Secondary | ICD-10-CM

## 2017-10-14 DIAGNOSIS — Z0389 Encounter for observation for other suspected diseases and conditions ruled out: Secondary | ICD-10-CM | POA: Diagnosis not present

## 2017-10-14 DIAGNOSIS — Z3401 Encounter for supervision of normal first pregnancy, first trimester: Secondary | ICD-10-CM | POA: Diagnosis not present

## 2017-10-14 DIAGNOSIS — Z3689 Encounter for other specified antenatal screening: Secondary | ICD-10-CM | POA: Diagnosis not present

## 2017-10-14 DIAGNOSIS — J209 Acute bronchitis, unspecified: Secondary | ICD-10-CM

## 2017-10-14 DIAGNOSIS — Z8759 Personal history of other complications of pregnancy, childbirth and the puerperium: Secondary | ICD-10-CM | POA: Diagnosis not present

## 2017-10-14 LAB — POCT URINE PREGNANCY: Preg Test, Ur: POSITIVE — AB

## 2017-10-14 MED ORDER — ALBUTEROL SULFATE HFA 108 (90 BASE) MCG/ACT IN AERS: 2.0000 | INHALATION_SPRAY | Freq: Four times a day (QID) | RESPIRATORY_TRACT | 0 refills | Status: DC | PRN

## 2017-10-14 MED ORDER — AZITHROMYCIN 250 MG PO TABS: ORAL_TABLET | ORAL | 0 refills | Status: DC

## 2017-10-14 NOTE — Patient Instructions (Signed)
[facility].

## 2017-10-14 NOTE — Progress Notes (Signed)
Patient ID: Kara FleetStephanie Stewart, female    DOB: [birth year], 42 y.o.   MRN: [id number]  Chief Complaint  Patient presents with  . Cough    2 months  . may be pregnant    Allergies Penicillins and Lidocaine  Subjective:   Kara Stewart is a 42 y.o. female who [facility].  HPI Here for visit b/c of cough for over a month and she reports that she believes that she could be pregnant. Took a home pregnancy test 2 days ago and it was positive. Reports that has an [facility] today.  Reports that had a sinus infection a month ago, had sneezing, cough, sinus pain. Reports that sinus issues went away but has had persistant cough. Cough is productive of sputum and sounds like wet cough. Has been smoking occasionally but not daily. Is not on PNV.  Lost last pregnancy. Has never had children and really hopes she is pregnant.  Has not had a drop of alcohol in over a year.  Has had a history of asthma in the past. Feels more winded with exertion than usual. Cough is a lot and bothersome. Works at the [employer] and told needs to get rid of cough. No hemoptysis.    Past Medical History:  Diagnosis Date  . Acid reflux   . Alcohol abuse 07/21/2012  . 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 Anxiety   . Asthma   . BV (bacterial vaginosis) 04/26/2015  . Colitis   . Dyspepsia 2004   Dx w/ PUD but no EGD, [place]   . Hiatal hernia   . History of alcohol abuse 04/26/2015  . History of colitis 11/29/2014  . Hypertension   . Hypertriglyceridemia   . Hypothyroid   . Iritis    frequent  . Irregular periods 01/11/2014  . Ovarian cyst   . Panic attacks   . RLQ abdominal pain 01/11/2014  . SVT (supraventricular tachycardia) (HCC)   . Thyroid nodule   . Trichomonal vaginitis 01/11/2014  . Vaginal discharge 01/11/2014  . Weight gain 01/11/2014    Past Surgical History:  Procedure Laterality Date  . BIOPSY N/A 02/05/2015   Procedure: BIOPSY;  Surgeon: West BaliSandi L  Fields, MD;  Location: [facility]S;  Service: Endoscopy;  Laterality: N/A;  . COLONOSCOPY    . COLONOSCOPY WITH PROPOFOL N/A 02/05/2015   QIO:NGEXBSLF:small HH/mild diverticulosis  . ESOPHAGEAL DILATION N/A 11/21/2015   Procedure: ESOPHAGEAL DILATION;  Surgeon: Corbin Adeobert M Rourk, MD;  Location: [facility] END[facility] SUITE;  Service: Endoscopy;  Laterality: N/A;  . ESOPHAGOGASTRODUODENOSCOPY  11/06/2011   [name]: MILD Esophagitis/PATENT ESOPHAGEAL Stricture/  Moderate gastritis. Bx no.hpylori or celiac, +gastritis  . ESOPHAGOGASTRODUODENOSCOPY (EGD) WITH PROPOFOL N/A 03/20/2014   [name]: 1. Mild esophagitis & distal esophagela stricture. 2. small hiatal hernia 3. moderate non-erosive gastritis and mild duodenits  . ESOPHAGOGASTRODUODENOSCOPY (EGD) WITH PROPOFOL N/A 11/21/2015   Dr. Jena Gaussourk: LA grade B esophagitis, MW tear likely source of hematemesis  . SAVORY DILATION N/A 03/20/2014   Procedure: SAVORY DILATION;  Surgeon: West BaliSandi L Fields, MD;  Location: [facility];  Service: Endoscopy;  Laterality: N/A;  dilated with # 12.8, 14,15,16  . TOOTH EXTRACTION      Family History  Problem Relation Age of Onset  . Stomach cancer Paternal Grandfather        colon cancer  . Cancer Paternal Grandfather        throat and esophagus  . Breast cancer Maternal Grandmother   .  Cancer Maternal Grandmother        skin  . Anxiety disorder Maternal Grandmother   . Hypertension Mother   . Other Mother        fatty liver  . Hyperlipidemia Mother   . Liver disease Mother        fatty liver, does not drink.   . Other Father        varicose veins; stomach issues; hernia  . Hypertension Father   . Hyperlipidemia Father   . Cancer Father        prostate  . Arthritis Father        rheumatoid  . Thyroid disease Sister   . Other Brother        hernia  . Diabetes Maternal Grandfather   . Heart disease Maternal Grandfather   . Other Paternal Grandmother        hernia  . COPD Paternal Grandmother   . Diabetes Paternal Grandmother      Social  History   Socioeconomic History  . Marital status: Legally Separated    Spouse name: Not on file  . Number of children: 0  . Years of education: Not on file  . Highest education level: Not on file  Occupational History  . 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 Financial resource strain: Not on file  . Food insecurity:    Worry: Not on file    Inability: Not on file  . Transportation needs:    Medical: Not on file    Non-medical: Not on file  Tobacco Use  . Smoking status: Former Smoker    Types: Cigarettes  . Smokeless tobacco: Never Used  Substance and Sexual Activity  . Alcohol use: No    Alcohol/week: 0.0 oz    Comment: h/o etoh abuse, quit early 2017 but started back summer 2017.; denied 12/14/16  . Drug use: No  . Sexual activity: Yes    Birth control/protection: None  Lifestyle  . Physical activity:    Days per week: Not on file    Minutes per session: Not on file  . Stress: Not on file  Relationships  . Social connections:    Talks on phone: Not on file    Gets together: Not on file    Attends religious service: Not on file    Active member of club or organization: Not on file    Attends meetings of clubs or organizations: Not on file    Relationship status: Not on file  Other Topics Concern  . Not on file  Social History Narrative   Lives alone with a roommate. Dating and in safe relationship. Does not smoke. Denies drug use.    Previous MD: Phill Mutter, NP ([place], [place])       Review of Systems  Constitutional: Negative for appetite change, chills, fever and unexpected weight change.  HENT: Negative for congestion, mouth sores, sinus pressure, sinus pain, sneezing, sore throat, trouble swallowing and voice change.   Respiratory: Positive for cough and wheezing.   Cardiovascular: Negative for chest pain, palpitations and leg swelling.  Gastrointestinal: Negative for abdominal pain.  Musculoskeletal: Negative for myalgias.    Neurological: Negative for dizziness and light-headedness.  Hematological: Negative for adenopathy. Does not bruise/bleed easily.  Psychiatric/Behavioral: The patient is not nervous/anxious.      Objective:   BP 110/70 (BP Location: Left Arm, Patient Position: Sitting, Cuff Size: Normal)   Pulse 82   Temp 98.8 F (  37.1 C) (Oral)   Resp 16   Ht 5' 3.5" (1.613 m)   Wt 144 lb 0.6 oz (65.3 kg)   LMP 09/16/2017   SpO2 98%   BMI 25.12 kg/m   Physical Exam  Constitutional: She is oriented to person, place, and time. She appears well-developed and well-nourished.  HENT:  Head: Normocephalic and atraumatic.  Nose: Nose normal.  Mouth/Throat: Oropharynx is clear and moist.  Eyes: Pupils are equal, round, and reactive to light. Conjunctivae and EOM are normal. No scleral icterus.  Neck: No JVD present.  Cardiovascular: Normal rate, regular rhythm and normal heart sounds.  Pulmonary/Chest: Effort normal and breath sounds normal. No stridor. No respiratory distress. She has no wheezes.  Neurological: She is alert and oriented to person, place, and time. No cranial nerve deficit.  Skin: Skin is warm and dry.  Psychiatric: She has a normal mood and affect. Her behavior is normal. Judgment and thought content normal.     Assessment and Plan  1. Acute bronchitis, unspecified organism Bronchitis.  Supportive care discussed with patient.  Patient was counseled concerning worrisome signs and symptoms and told if those develop to go to the emergency department..  Was given an albuterol inhaler to use as needed.  There was no appreciable wheezing on exam today.  Smoking cessation was recommended. - azithromycin (ZITHROMAX) 250 MG tablet; Take two pills today and one pill each day for the next four days  Dispense: 6 tablet; Refill: 0 - albuterol (PROVENTIL HFA;VENTOLIN HFA) 108 (90 Base) MCG/ACT inhaler; Inhale 2 puffs into the lungs every 6 (six) hours as needed for wheezing or shortness of  breath.  Dispense: 1 Inhaler; Refill: 0  2. Amenorrhea Pregnancy test was positive.  She has an appointment with obstetrics today at 1 PM.  She will keep that appointment.  Patient is considered a high risk pregnancy due to her liver dysfunction, age, previous pregnancy loss.  She will also follow-up with her gastroenterologist/liver specialist. - POCT urine pregnancy  No follow-ups on file. Edwena Bunde, CMA 10/14/2017

## 2017-10-18 ENCOUNTER — Encounter: Payer: Self-pay | Admitting: Family Medicine

## 2017-10-19 ENCOUNTER — Other Ambulatory Visit (HOSPITAL_COMMUNITY): Payer: Self-pay | Admitting: Unknown Physician Specialty

## 2017-10-19 DIAGNOSIS — O26611 Liver and biliary tract disorders in pregnancy, first trimester: Secondary | ICD-10-CM

## 2017-10-21 DIAGNOSIS — Z8759 Personal history of other complications of pregnancy, childbirth and the puerperium: Secondary | ICD-10-CM | POA: Diagnosis not present

## 2017-10-25 ENCOUNTER — Encounter: Payer: Self-pay | Admitting: Gastroenterology

## 2017-10-26 ENCOUNTER — Ambulatory Visit (HOSPITAL_COMMUNITY)
Admission: RE | Admit: 2017-10-26 | Discharge: 2017-10-26 | Disposition: A | Discharge status: Home / Self Care | Payer: Medicaid Other | Source: Ambulatory Visit | Attending: Unknown Physician Specialty | Admitting: Unknown Physician Specialty

## 2017-10-26 ENCOUNTER — Other Ambulatory Visit (HOSPITAL_COMMUNITY): Payer: Self-pay | Admitting: Unknown Physician Specialty

## 2017-10-26 DIAGNOSIS — O26611 Liver and biliary tract disorders in pregnancy, first trimester: Secondary | ICD-10-CM

## 2017-10-26 DIAGNOSIS — Z3689 Encounter for other specified antenatal screening: Secondary | ICD-10-CM

## 2017-10-26 DIAGNOSIS — Z3A01 Less than 8 weeks gestation of pregnancy: Secondary | ICD-10-CM | POA: Insufficient documentation

## 2017-11-01 ENCOUNTER — Telehealth: Payer: Self-pay

## 2017-11-01 ENCOUNTER — Encounter: Payer: Self-pay | Admitting: Family Medicine

## 2017-11-01 NOTE — Telephone Encounter (Signed)
Added to cancellation list 

## 2017-11-01 NOTE — Telephone Encounter (Signed)
Pt called office to schedule appt d/t she is pregnant. OBGYN and PCP requested she see GI. Informed her appt had been scheduled for 02/02/18. She needs to be seen in 1 month. Wants to be added to cancellation list.

## 2017-11-09 ENCOUNTER — Ambulatory Visit (HOSPITAL_COMMUNITY)
Admission: RE | Admit: 2017-11-09 | Discharge: 2017-11-09 | Disposition: A | Discharge status: Home / Self Care | Payer: Medicaid Other | Source: Ambulatory Visit | Attending: Unknown Physician Specialty | Admitting: Unknown Physician Specialty

## 2017-11-09 DIAGNOSIS — Z3689 Encounter for other specified antenatal screening: Secondary | ICD-10-CM

## 2017-11-09 DIAGNOSIS — Z3A01 Less than 8 weeks gestation of pregnancy: Secondary | ICD-10-CM | POA: Insufficient documentation

## 2017-11-09 DIAGNOSIS — O3680X0 Pregnancy with inconclusive fetal viability, not applicable or unspecified: Secondary | ICD-10-CM | POA: Diagnosis not present

## 2017-11-16 ENCOUNTER — Ambulatory Visit (INDEPENDENT_AMBULATORY_CARE_PROVIDER_SITE_OTHER): Payer: Medicaid Other | Admitting: Gastroenterology

## 2017-11-16 ENCOUNTER — Encounter: Payer: Self-pay | Admitting: Gastroenterology

## 2017-11-16 VITALS — BP 118/67 | HR 91 | Temp 98.6°F | Ht 64.5 in | Wt 145.8 lb

## 2017-11-16 DIAGNOSIS — K769 Liver disease, unspecified: Secondary | ICD-10-CM | POA: Insufficient documentation

## 2017-11-16 HISTORY — DX: Liver disease, unspecified: K76.9

## 2017-11-16 NOTE — Progress Notes (Signed)
CC'ED TO PCP 

## 2017-11-16 NOTE — Assessment & Plan Note (Signed)
42 y/o female with h/o etoh hepatitis with ascites requiring paracentesis (2018), positive AMA on empiric URSO who presents for urgent follow up given she is now [redacted] weeks pregnant. Her OB stopped her lasix, aldactone, and URSO. Overall she is feeling well except for nausea and fatigue. Her weight is up only 4 pounds since 06/2017. She is watching her diet but has occasional nachos from [employer]. On exam today she has no significant edema or ascites.   Agree with holding off on diuretics for now. Strict 2 gram sodium diet. Will hold off on URSO for now. Update labs to get better idea of current liver status.   She has been without etoh for 15 months. Imaging in 09/2016 with mild hepatosplenomegaly therefore we had suggested management as per possible early cirrhosis although some of these findings could have improved with etoh cessation. Will monitor closely during pregnancy from liver standpoint.   Regarding abdominal pain, I suspect musculoskeletal in nature although early hernia cannot be excluded.  Unlikely related to her liver or other visceral source.  Update labs as planned.  Return to the office in 6 to 8 weeks.

## 2017-11-16 NOTE — Progress Notes (Signed)
Primary Care Physician: Aliene BeamsHagler, Rachel, MD  Primary Gastroenterologist:  Jonette EvaSandi Fields, MD   Chief Complaint  Patient presents with  . Abdominal Pain    around naval  . abdominal swelling    HPI: Kara Stewart is a 42 y.o. female here for follow up of chronic liver disease. She has h/o ETOH hepatitis, elevated AMA empirically treated with URSO since one year ago, known severe hepatic steatosis with hepatosplenomegaly (on CT 09/2016) with likely chronic liver disease complicated by ascites requiring abdominal paracentesis last July 2018. Last seen in 12/2016.  Last imaging 09/2016. Patient was lost to follow up.   She has had NO etoh since 06/2016.  She is [redacted] weeks pregnant which is the longest of any of her pregnancies so far. She had at least 3 miscarriages in 2018 and desires pregnancy.    Overall she feels ok. She is having nausea related to the pregnancy. No vomiting. She has some swelling in her legs and sits a lot at work. Some swelling in her abdomen "even before I found out I was pregnancy". She has some sharp pain just to the right of the umbilicus worse with sneezing/coughing, movement. Wonders about possibly hernia but has not noted any bulging. BMs pretty good right now. No melena, brbpr. No heartburn.   She has stopped her lasix, aldactone, urso per request of her OB.   Current Outpatient Medications  Medication Sig Dispense Refill  . albuterol (PROVENTIL HFA;VENTOLIN HFA) 108 (90 Base) MCG/ACT inhaler Inhale 2 puffs into the lungs every 6 (six) hours as needed for wheezing or shortness of breath. 1 Inhaler 0  . Prenatal Vit-Fe Fumarate-FA (PRENATAL VITAMIN PO) Take by mouth daily.     No current facility-administered medications for this visit.     Allergies as of 11/16/2017 - Review Complete 11/16/2017  Allergen Reaction Noted  . Penicillins Anaphylaxis 10/18/2011  . Lidocaine Other (See Comments) 06/22/2014    ROS:  General: Negative for anorexia, weight loss,  fever, chills, fatigue, weakness. ENT: Negative for hoarseness, difficulty swallowing , nasal congestion. CV: Negative for chest pain, angina, palpitations, dyspnea on exertion, peripheral edema.  Respiratory: Negative for dyspnea at rest, dyspnea on exertion, cough, sputum, wheezing.  GI: See history of present illness. GU:  Negative for dysuria, hematuria, urinary incontinence, urinary frequency, nocturnal urination.  Endo: Negative for unusual weight change.    Physical Examination:   BP 118/67   Pulse 91   Temp 98.6 F (37 C) (Oral)   Ht 5' 4.5" (1.638 m)   Wt 145 lb 12.8 oz (66.1 kg)   BMI 24.64 kg/m   General: Well-nourished, well-developed in no acute distress.  Eyes: No icterus. Mouth: Oropharyngeal mucosa moist and pink , no lesions erythema or exudate. Lungs: Clear to auscultation bilaterally.  Heart: Regular rate and rhythm, no murmurs rubs or gallops.  Abdomen: Bowel sounds are normal, minimal tenderness right of umbilicus without obvious hernia.  nondistended, no hepatosplenomegaly or masses, no abdominal bruits or hernia , no rebound or guarding.   Extremities: No lower extremity edema. No clubbing or deformities. Neuro: Alert and oriented x 4   Skin: Warm and dry, no jaundice.  Spider angiomata on chest and upper arms.  Psych: Alert and cooperative, normal mood and affect.  Labs:  Lab Results  Component Value Date   CREATININE 0.63 01/27/2017   BUN 6 (L) 01/27/2017   NA 138 01/27/2017   K 3.5 01/27/2017   CL 106 01/27/2017  CO2 25 01/27/2017   Lab Results  Component Value Date   ALT 9 12/23/2016   AST 18 12/23/2016   ALKPHOS 87 11/03/2016   BILITOT 2.0 (H) 12/23/2016   Lab Results  Component Value Date   WBC 4.0 12/23/2016   HGB 12.0 12/23/2016   HCT 35.6 12/23/2016   MCV 88.8 12/23/2016   PLT 97 (L) 12/23/2016    Imaging Studies: [place] Ob Transvaginal  Result Date: 11/09/2017 CLINICAL DATA:  LMP 09/16/2017. By LMP patient is 7 weeks 5 days. EDC  by LMP is 06/23/2018. RIGHT LOWER QUADRANT pain. Assess viability. Follow-up study. EXAM: OBSTETRIC <14 WK US AND TRANSVAGINAL OB US TECHNIQUE: Both transabdominal and transvaginal ultrasound examinations were performed for complete evaluation of the gestation as well as the maternal uterus, adnexal regions, and pelvic cul-de-sac. Transvaginal technique was performed to assess early pregnancy. COMPARISON:  10/26/2017 FINDINGS: Intrauterine gestational sac: Single Yolk sac:  Visualized. Embryo:  Visualized. Cardiac Activity: Visualized. Heart Rate: 166 bpm CRL:  15.7 mm   7 w   6 d                  US EDC: 06/21/2017 Subchorionic hemorrhage:  None visualized. Maternal uterus/adnexae: RIGHT corpus luteum cyst. Normal appearance of LEFT ovary. No free pelvic fluid. IMPRESSION: 1. Single living intrauterine embryo. Normal embryonic cardiac rate. 2. Appropriate interval growth. Today's exam confirms clinical EDC of 06/23/2018. Electronically Signed   By: Norva PavlovElizabeth  Brown M.D.   On: 11/09/2017 14:15   [place] Ob Less Than 14 Weeks With Ob Transvaginal  Result Date: 10/26/2017 CLINICAL DATA:  Establish gestational age EXAM: OBSTETRIC <14 WK US AND TRANSVAGINAL OB US TECHNIQUE: Both transabdominal and transvaginal ultrasound examinations were performed for complete evaluation of the gestation as well as the maternal uterus, adnexal regions, and pelvic cul-de-sac. Transvaginal technique was performed to assess early pregnancy. COMPARISON:  None. FINDINGS: Intrauterine gestational sac: Single Yolk sac:  Visualized Embryo:  Visualized Cardiac Activity: Visualized Heart Rate: 90 bpm MSD:   mm    w     d CRL:  2.41 mm   5 w   5 d                  US EDC: 06/21/2018 Subchorionic hemorrhage:  None visualized. Maternal uterus/adnexae: No adnexal mass. Small amount of free fluid. IMPRESSION: Five week 5 day intrauterine pregnancy. Fetal bradycardia which may be related to early gestational age. This could be followed with repeat  ultrasound in 14 days to ensure expected progression. No acute maternal findings. Electronically Signed   By: Charlett NoseKevin  Dover M.D.   On: 10/26/2017 08:43

## 2017-11-16 NOTE — Patient Instructions (Signed)
1. Please stop lasix, aldactone, urso for now. 2. Have your labs done. We will contact you with results as soon as available.  3. Please stick to a sodium restricted diet. Limit your sodium to no more than 2 grams (2000 milligrams) daily. 4. Return to the office in 6-8 weeks or call sooner if needed.

## 2017-11-16 NOTE — Progress Notes (Signed)
L[facility] October 14, 2017 [facility]  White blood cell count 5500, hemoglobin 13.2, MCV 87, platelets 115,000, glucose 80, BUN 9, creatinine 0.74, sodium 140, potassium 4.2, total bilirubin 1.6, alkaline phosphatase 80, AST 20, ALT 13.  Hepatitis B surface antigen negative.

## 2017-12-07 ENCOUNTER — Encounter: Payer: Self-pay | Admitting: Family Medicine

## 2017-12-07 DIAGNOSIS — J069 Acute upper respiratory infection, unspecified: Secondary | ICD-10-CM | POA: Diagnosis not present

## 2017-12-07 DIAGNOSIS — J40 Bronchitis, not specified as acute or chronic: Secondary | ICD-10-CM | POA: Diagnosis not present

## 2017-12-07 DIAGNOSIS — J029 Acute pharyngitis, unspecified: Secondary | ICD-10-CM | POA: Diagnosis not present

## 2017-12-08 ENCOUNTER — Other Ambulatory Visit: Payer: Self-pay

## 2017-12-08 ENCOUNTER — Ambulatory Visit (INDEPENDENT_AMBULATORY_CARE_PROVIDER_SITE_OTHER): Payer: Medicaid Other | Admitting: Family Medicine

## 2017-12-08 ENCOUNTER — Emergency Department (HOSPITAL_COMMUNITY)
Admission: EM | Admit: 2017-12-08 | Discharge: 2017-12-08 | Disposition: A | Discharge status: Home / Self Care | Payer: Medicaid Other | Attending: Emergency Medicine | Admitting: Emergency Medicine

## 2017-12-08 ENCOUNTER — Encounter: Payer: Self-pay | Admitting: Family Medicine

## 2017-12-08 ENCOUNTER — Encounter (HOSPITAL_COMMUNITY): Payer: Self-pay | Admitting: *Deleted

## 2017-12-08 VITALS — BP 106/56 | HR 100 | Temp 99.2°F | Resp 12 | Ht 64.5 in | Wt 147.1 lb

## 2017-12-08 DIAGNOSIS — R509 Fever, unspecified: Secondary | ICD-10-CM | POA: Diagnosis not present

## 2017-12-08 DIAGNOSIS — F17210 Nicotine dependence, cigarettes, uncomplicated: Secondary | ICD-10-CM | POA: Insufficient documentation

## 2017-12-08 DIAGNOSIS — O99891 Other specified diseases and conditions complicating pregnancy: Secondary | ICD-10-CM

## 2017-12-08 DIAGNOSIS — R233 Spontaneous ecchymoses: Secondary | ICD-10-CM

## 2017-12-08 DIAGNOSIS — R05 Cough: Secondary | ICD-10-CM

## 2017-12-08 DIAGNOSIS — R1030 Lower abdominal pain, unspecified: Secondary | ICD-10-CM | POA: Diagnosis not present

## 2017-12-08 DIAGNOSIS — R059 Cough, unspecified: Secondary | ICD-10-CM

## 2017-12-08 DIAGNOSIS — E039 Hypothyroidism, unspecified: Secondary | ICD-10-CM | POA: Insufficient documentation

## 2017-12-08 DIAGNOSIS — J45901 Unspecified asthma with (acute) exacerbation: Secondary | ICD-10-CM | POA: Diagnosis not present

## 2017-12-08 DIAGNOSIS — I10 Essential (primary) hypertension: Secondary | ICD-10-CM | POA: Diagnosis not present

## 2017-12-08 DIAGNOSIS — Z79899 Other long term (current) drug therapy: Secondary | ICD-10-CM | POA: Diagnosis not present

## 2017-12-08 DIAGNOSIS — J40 Bronchitis, not specified as acute or chronic: Secondary | ICD-10-CM | POA: Diagnosis not present

## 2017-12-08 DIAGNOSIS — O9989 Other specified diseases and conditions complicating pregnancy, childbirth and the puerperium: Secondary | ICD-10-CM | POA: Diagnosis not present

## 2017-12-08 DIAGNOSIS — O99511 Diseases of the respiratory system complicating pregnancy, first trimester: Secondary | ICD-10-CM | POA: Diagnosis not present

## 2017-12-08 DIAGNOSIS — Z3A12 12 weeks gestation of pregnancy: Secondary | ICD-10-CM | POA: Diagnosis not present

## 2017-12-08 LAB — CBC WITH DIFFERENTIAL/PLATELET
Basophils Absolute: 0 10*3/uL (ref 0.0–0.1)
Basophils Relative: 0 %
EOS ABS: 0.1 10*3/uL (ref 0.0–0.7)
EOS PCT: 2 %
HCT: 34.9 % — ABNORMAL LOW (ref 36.0–46.0)
HEMOGLOBIN: 11.9 g/dL — AB (ref 12.0–15.0)
LYMPHS ABS: 0.7 10*3/uL (ref 0.7–4.0)
LYMPHS PCT: 13 %
MCH: 30 pg (ref 26.0–34.0)
MCHC: 34.1 g/dL (ref 30.0–36.0)
MCV: 87.9 fL (ref 78.0–100.0)
MONOS PCT: 5 %
Monocytes Absolute: 0.3 10*3/uL (ref 0.1–1.0)
Neutro Abs: 4.1 10*3/uL (ref 1.7–7.7)
Neutrophils Relative %: 80 %
PLATELETS: 80 10*3/uL — AB (ref 150–400)
RBC: 3.97 MIL/uL (ref 3.87–5.11)
RDW: 13.3 % (ref 11.5–15.5)
WBC: 5.2 10*3/uL (ref 4.0–10.5)

## 2017-12-08 LAB — COMPREHENSIVE METABOLIC PANEL
ALK PHOS: 64 U/L (ref 38–126)
ALT: 19 U/L (ref 0–44)
AST: 23 U/L (ref 15–41)
Albumin: 3.7 g/dL (ref 3.5–5.0)
Anion gap: 5 (ref 5–15)
BUN: 6 mg/dL (ref 6–20)
CALCIUM: 9 mg/dL (ref 8.9–10.3)
CHLORIDE: 108 mmol/L (ref 98–111)
CO2: 24 mmol/L (ref 22–32)
CREATININE: 0.47 mg/dL (ref 0.44–1.00)
GFR calc Af Amer: >60 mL/min (ref >60)
GFR calc non Af Amer: >60 mL/min (ref >60)
GLUCOSE: 92 mg/dL (ref 70–99)
Potassium: 3.5 mmol/L (ref 3.5–5.1)
Sodium: 137 mmol/L (ref 135–145)
Total Bilirubin: 1 mg/dL (ref 0.3–1.2)
Total Protein: 6.7 g/dL (ref 6.5–8.1)

## 2017-12-08 LAB — URINALYSIS, ROUTINE W REFLEX MICROSCOPIC
BILIRUBIN URINE: NEGATIVE
GLUCOSE, UA: NEGATIVE mg/dL
HGB URINE DIPSTICK: NEGATIVE
Ketones, ur: NEGATIVE mg/dL
Leukocytes, UA: NEGATIVE
Nitrite: NEGATIVE
PH: 7 (ref 5.0–8.0)
Protein, ur: NEGATIVE mg/dL
SPECIFIC GRAVITY, URINE: 1.004 — AB (ref 1.005–1.030)

## 2017-12-08 NOTE — ED Triage Notes (Signed)
Pt c/o fever and dizziness for the last couple of days; pt c/o cough with some clear or yellow sputum; pt was seen at her PCP this am and was told to come to ED for possible right lobe pneumonia; pt is [redacted] weeks pregnant

## 2017-12-08 NOTE — ED Provider Notes (Signed)
[facility] EMERGENCY DEPARTMENT Provider Note   CSN: [id number] Arrival date & time: 12/08/17  1015     History   Chief Complaint Chief Complaint  Patient presents with  . Fever    HPI Kara Stewart is a 42 y.o. female.  Patient complains of a cough fever.  She was placed on Zithromax yesterday.  Patient also is [redacted] weeks pregnant and has history of liver problems with low platelets.  The history is provided by the patient. No language interpreter was used.  Fever   This is a new problem. The current episode started 2 days ago. The problem occurs daily. The problem has been resolved. Her temperature was unmeasured prior to arrival. Associated symptoms include cough. Pertinent negatives include no chest pain, no diarrhea, no congestion and no headaches. She has tried nothing for the symptoms. The treatment provided no relief.    Past Medical History:  Diagnosis Date  . Acid reflux   . Alcohol abuse 07/21/2012  . 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 Anxiety   . Asthma   . BV (bacterial vaginosis) 04/26/2015  . Colitis   . Dyspepsia 2004   Dx w/ PUD but no EGD, [place], [place]   . Hiatal hernia   . History of alcohol abuse 04/26/2015  . History of colitis 11/29/2014  . Hypertension   . Hypertriglyceridemia   . Hypothyroid   . Iritis    frequent  . Irregular periods 01/11/2014  . Ovarian cyst   . Panic attacks   . RLQ abdominal pain 01/11/2014  . SVT (supraventricular tachycardia) (HCC)   . Thyroid nodule   . Trichomonal vaginitis 01/11/2014  . Vaginal discharge 01/11/2014  . Weight gain 01/11/2014    Patient Active Problem List   Diagnosis Date Noted  . Chronic liver disease 11/16/2017  . Mallory-Weiss tear   . Hematemesis without nausea   . Esophageal dysphagia   . Alcoholic hepatitis without ascites   . Marijuana abuse 01/20/2015  . Abnormal CT scan, colon 01/08/2015  . Ovarian cyst 11/29/2014  . Anxiety 11/29/2014  . History of colitis 11/29/2014  . RUQ pain  08/27/2014  . Abdominal pain 08/07/2014  . Dysphagia, idiopathic 03/05/2014  . Thyroid nodule 01/11/2014  . Irregular periods 01/11/2014  . SVT (supraventricular tachycardia) (HCC) 09/07/2012  . EBV infection 08/11/2012  . Orthostatic hypotension 08/08/2012  . GERD (gastroesophageal reflux disease) 07/21/2012  . Alcohol abuse 07/21/2012  . Elevated LFTs- negative HIDA 10/28/2011  . Fatty liver 10/28/2011    Past Surgical History:  Procedure Laterality Date  . BIOPSY N/A 02/05/2015   Procedure: BIOPSY;  Surgeon: West Bali, MD;  Location: [facility] ORS;  Service: Endoscopy;  Laterality: N/A;  . COLONOSCOPY    . COLONOSCOPY WITH PROPOFOL N/A 02/05/2015   WUJ:[id number] HH/mild diverticulosis  . ESOPHAGEAL DILATION N/A 11/21/2015   Procedure: ESOPHAGEAL DILATION;  Surgeon: Corbin Ade, MD;  Location: [facility] SUITE;  Service: Endoscopy;  Laterality: N/A;  . ESOPHAGOGASTRODUODENOSCOPY  11/06/2011   [name]: MILD Esophagitis/PATENT ESOPHAGEAL Stricture/  Moderate gastritis. Bx no.hpylori or celiac, +gastritis  . ESOPHAGOGASTRODUODENOSCOPY (EGD) WITH PROPOFOL N/A 03/20/2014   [name]: 1. Mild esophagitis & distal esophagela stricture. 2. small hiatal hernia 3. moderate non-erosive gastritis and mild duodenits  . ESOPHAGOGASTRODUODENOSCOPY (EGD) WITH PROPOFOL N/A 11/21/2015   Dr. Jena Gauss: LA grade B esophagitis, MW tear likely source of hematemesis  . SAVORY DILATION N/A 03/20/2014   Procedure: SAVORY DILATION;  Surgeon: West Bali, MD;  Location: [facility] ORS;  Service: Endoscopy;  Laterality: N/A;  dilated with # 12.8, 14,15,16  . TOOTH EXTRACTION       OB History    Gravida  3   Para      Term      Preterm      AB  1   Living        SAB      TAB  1   Ectopic      Multiple      Live Births               Home Medications    Prior to Admission medications   Medication Sig Start Date End Date Taking? Authorizing Provider  albuterol (PROVENTIL HFA;VENTOLIN HFA) 108 (90 Base)  MCG/ACT inhaler Inhale 2 puffs into the lungs every 6 (six) hours as needed for wheezing or shortness of breath. 10/14/17  Yes Hagler, Fleet Contras, MD  Prenatal Vit-Fe Fumarate-FA (PRENATAL VITAMIN PO) Take 1 tablet by mouth daily.    Yes [provider]  azithromycin (ZITHROMAX) 250 MG tablet Take 1 tablet by mouth daily. 12/07/17   [provider]    Family History Family History  Problem Relation Age of Onset  . Stomach cancer Paternal Grandfather        colon cancer  . Cancer Paternal Grandfather        throat and esophagus  . Breast cancer Maternal Grandmother   . Cancer Maternal Grandmother        skin  . Anxiety disorder Maternal Grandmother   . Hypertension Mother   . Other Mother        fatty liver  . Hyperlipidemia Mother   . Liver disease Mother        fatty liver, does not drink.   . Other Father        varicose veins; stomach issues; hernia  . Hypertension Father   . Hyperlipidemia Father   . Cancer Father        prostate  . Arthritis Father        rheumatoid  . Thyroid disease Sister   . Other Brother        hernia  . Diabetes Maternal Grandfather   . Heart disease Maternal Grandfather   . Other Paternal Grandmother        hernia  . COPD Paternal Grandmother   . Diabetes Paternal Grandmother     Social History Social History   Tobacco Use  . Smoking status: Current Every Day Smoker    Types: Cigarettes  . Smokeless tobacco: Never Used  Substance Use Topics  . Alcohol use: No    Alcohol/week: 0.0 standard drinks    Comment: h/o etoh abuse, quit early 2017 but started back summer 2017.; denied 11/16/17  . Drug use: No     Allergies   Penicillins and Lidocaine   Review of Systems Review of Systems  Constitutional: Positive for fever. Negative for appetite change and fatigue.  HENT: Negative for congestion, ear discharge and sinus pressure.   Eyes: Negative for discharge.  Respiratory: Positive for cough.   Cardiovascular: Negative  for chest pain.  Gastrointestinal: Negative for abdominal pain and diarrhea.  Genitourinary: Negative for frequency and hematuria.  Musculoskeletal: Negative for back pain.  Skin: Negative for rash.  Neurological: Negative for seizures and headaches.  Psychiatric/Behavioral: Negative for hallucinations.     Physical Exam Updated Vital Signs BP 111/66 (BP Location: Right Arm)   Pulse 100   Temp 98.5 F (36.9  C) (Oral)   Resp 12   Ht 5' 4.5" (1.638 m)   Wt 66.7 kg   LMP 09/16/2017   SpO2 98%   BMI 24.85 kg/m   Physical Exam  Constitutional: She is oriented to person, place, and time. She appears well-developed.  HENT:  Head: Normocephalic.  Eyes: Conjunctivae and EOM are normal. No scleral icterus.  Neck: Neck supple. No thyromegaly present.  Cardiovascular: Normal rate and regular rhythm. Exam reveals no gallop and no friction rub.  No murmur heard. Pulmonary/Chest: No stridor. She has no wheezes. She has no rales. She exhibits no tenderness.  Abdominal: She exhibits no distension. There is no tenderness. There is no rebound.  Musculoskeletal: Normal range of motion. She exhibits no edema.  Lymphadenopathy:    She has no cervical adenopathy.  Neurological: She is oriented to person, place, and time. She exhibits normal muscle tone. Coordination normal.  Skin: No rash noted. No erythema.  Few petechiae  Psychiatric: She has a normal mood and affect. Her behavior is normal.     ED Treatments / Results  Labs (all labs ordered are listed, but only abnormal results are displayed) Labs Reviewed  CBC WITH DIFFERENTIAL/PLATELET - Abnormal; Notable for the following components:      Result Value   Hemoglobin 11.9 (*)    HCT 34.9 (*)    Platelets 80 (*)    All other components within normal limits  URINALYSIS, ROUTINE W REFLEX MICROSCOPIC - Abnormal; Notable for the following components:   APPearance HAZY (*)    Specific Gravity, Urine 1.004 (*)    All other components  within normal limits  COMPREHENSIVE METABOLIC PANEL    EKG None  Radiology No results found.  Procedures Procedures (including critical care time)  Medications Ordered in ED Medications - No data to display   Initial Impression / Assessment and Plan / ED Course  I have reviewed the triage vital signs and the nursing notes.  Pertinent labs & imaging results that were available during my care of the patient were reviewed by me and considered in my medical decision making (see chart for details).   Patient with upper respiratory infection.  She will continue the Zithromax.  Patient with intrauterine pregnancy seen on ultrasound a month ago.  She is [redacted] weeks pregnant now.  Patient also has low platelets which she has had low platelets before and last year it was about the same as it is now.  She will follow-up with OB/GYN next week continue the antibiotics and go to [facility] if she has severe pain and bleeding   Final Clinical Impressions(s) / ED Diagnoses   Final diagnoses:  Bronchitis    ED Discharge Orders    None       Bethann Berkshire, MD 12/08/17 1534

## 2017-12-08 NOTE — ED Notes (Signed)
Pt ambulating to rest room.

## 2017-12-08 NOTE — Progress Notes (Signed)
Patient ID: Kara Stewart, female    DOB: 06/13/1975, 42 y.o.   MRN: [id number]  Chief Complaint  Patient presents with  . Fever  . Chills  . Cough  . Headache  . Generalized Body Aches    Allergies Penicillins and Lidocaine  Subjective:   Kara Stewart is a 42 y.o. female who presents to [facility] today.  HPI Here for acute visit. Currently [redacted] weeks pregnant and followed by [facility].  Boyfriend was sick last week but did not get a fever, he has gotten over it, and was sick last week for 3 days, but no fevers, just sinus/congestion. Patient reports that she started feeling nasal congestion, sore throat, rhinorrhea, cough, and body aches. Has has fevers at home of 101.2. Went to [facility] yesterday and they did a flu test and a strep test and it was negative and gave her omnicef. Then yesterday called OB/Gyn and they called her in zithromax antibiotic and that if she was not better today to follow up.  Reports that last night started having some lower abdominal craming, intermittent. Worse with sitting or in bed. Not with standing. No back pain. Urinating normal. Does not feel like she is making as much urine as she was several days ago. Reports that she had been going to the bathroom more frequently urinating but now not as much. Appetite is low. Has been checking fetal heart tones at home b/c is "terrified that something is going to happen".  No dysuria. Unsure if urine has odor. Has been having some of her usual vaginal discharge that she believe has increased since she was pregnant. No bites from ticks or insects.  Was seen by liver doctor several weeks ago and had a rash and they told her it was probably b/c of her "low platelets" from liver disease and they gave her a lab slip to get blood work done but has not gotten it done. No rash on legs but just on arms.  Has felt dizzy for the past week, however dizzy symptoms are worse over the past two day. Has been getting  really dizzy and feels like going to pass out.   Reports that she feels winded and tired with walking. Feels that this has been going on since had bronchitis in the past.    Past Medical History:  Diagnosis Date  . Acid reflux   . Alcohol abuse 07/21/2012  . 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 Anxiety   . Asthma   . BV (bacterial vaginosis) 04/26/2015  . Colitis   . Dyspepsia 2004   Dx w/ PUD but no EGD, [place]   . Hiatal hernia   . History of alcohol abuse 04/26/2015  . History of colitis 11/29/2014  . Hypertension   . Hypertriglyceridemia   . Hypothyroid   . Iritis    frequent  . Irregular periods 01/11/2014  . Ovarian cyst   . Panic attacks   . RLQ abdominal pain 01/11/2014  . SVT (supraventricular tachycardia) (HCC)   . Thyroid nodule   . Trichomonal vaginitis 01/11/2014  . Vaginal discharge 01/11/2014  . Weight gain 01/11/2014    Past Surgical History:  Procedure Laterality Date  . BIOPSY N/A 02/05/2015   Procedure: BIOPSY;  Surgeon: West Bali, MD;  Location: [facility] ORS;  Service: Endoscopy;  Laterality: N/A;  . COLONOSCOPY    . COLONOSCOPY WITH PROPOFOL N/A 02/05/2015   NFA:OZHYQ HH/mild diverticulosis  . ESOPHAGEAL DILATION N/A 11/21/2015   Procedure:  ESOPHAGEAL DILATION;  Surgeon: Corbin Ade, MD;  Location: [facility] ENDO SUITE;  Service: Endoscopy;  Laterality: N/A;  . ESOPHAGOGASTRODUODENOSCOPY  11/06/2011   [name]: MILD Esophagitis/PATENT ESOPHAGEAL Stricture/  Moderate gastritis. Bx no.hpylori or celiac, +gastritis  . ESOPHAGOGASTRODUODENOSCOPY (EGD) WITH PROPOFOL N/A 03/20/2014   [name]: 1. Mild esophagitis & distal esophagela stricture. 2. small hiatal hernia 3. moderate non-erosive gastritis and mild duodenits  . ESOPHAGOGASTRODUODENOSCOPY (EGD) WITH PROPOFOL N/A 11/21/2015   Dr. Jena Gauss: LA grade B esophagitis, MW tear likely source of hematemesis  . SAVORY DILATION N/A 03/20/2014   Procedure: SAVORY DILATION;  Surgeon: West Bali, MD;  Location: [facility] ORS;  Service:  Endoscopy;  Laterality: N/A;  dilated with # 12.8, 14,15,16  . TOOTH EXTRACTION      Family History  Problem Relation Age of Onset  . Stomach cancer Paternal Grandfather        colon cancer  . Cancer Paternal Grandfather        throat and esophagus  . Breast cancer Maternal Grandmother   . Cancer Maternal Grandmother        skin  . Anxiety disorder Maternal Grandmother   . Hypertension Mother   . Other Mother        fatty liver  . Hyperlipidemia Mother   . Liver disease Mother        fatty liver, does not drink.   . Other Father        varicose veins; stomach issues; hernia  . Hypertension Father   . Hyperlipidemia Father   . Cancer Father        prostate  . Arthritis Father        rheumatoid  . Thyroid disease Sister   . Other Brother        hernia  . Diabetes Maternal Grandfather   . Heart disease Maternal Grandfather   . Other Paternal Grandmother        hernia  . COPD Paternal Grandmother   . Diabetes Paternal Grandmother      Social History   Socioeconomic History  . Marital status: Divorced    Spouse name: Not on file  . Number of children: 0  . Years of education: Not on file  . Highest education level: Not on file  Occupational History  . 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 Financial resource strain: Not on file  . Food insecurity:    Worry: Not on file    Inability: Not on file  . Transportation needs:    Medical: Not on file    Non-medical: Not on file  Tobacco Use  . Smoking status: Current Every Day Smoker    Types: Cigarettes  . Smokeless tobacco: Never Used  Substance and Sexual Activity  . Alcohol use: No    Alcohol/week: 0.0 standard drinks    Comment: h/o etoh abuse, quit early 2017 but started back summer 2017.; denied 11/16/17  . Drug use: No  . Sexual activity: Yes    Birth control/protection: None  Lifestyle  . Physical activity:    Days per week: Not on file    Minutes per session: Not on file  .  Stress: Not on file  Relationships  . Social connections:    Talks on phone: Not on file    Gets together: Not on file    Attends religious service: Not on file    Active member of club or organization: Not on file  Attends meetings of clubs or organizations: Not on file    Relationship status: Not on file  Other Topics Concern  . Not on file  Social History Narrative   Lives alone with a roommate. Dating and in safe relationship. Does not smoke. Denies drug use.    Previous MD: Phill Mutter, NP ([place], [place])       Review of Systems  Constitutional: Positive for appetite change, chills and fatigue. Negative for fever.  HENT: Positive for congestion, postnasal drip, rhinorrhea and sore throat. Negative for mouth sores and trouble swallowing.   Eyes: Negative for visual disturbance.  Respiratory: Positive for cough. Negative for chest tightness and shortness of breath.   Cardiovascular: Negative for chest pain, palpitations and leg swelling.  Gastrointestinal: Negative for abdominal pain, blood in stool, constipation, diarrhea, nausea and vomiting.  Genitourinary: Positive for vaginal discharge. Negative for dysuria, frequency and urgency.  Musculoskeletal: Positive for myalgias.  Skin: Positive for rash.  Neurological: Positive for dizziness and light-headedness. Negative for syncope.  Hematological: Negative for adenopathy.  Psychiatric/Behavioral: Negative for dysphoric mood. The patient is not nervous/anxious.      Objective:   BP (!) 106/56 (BP Location: Right Arm, Patient Position: Sitting, Cuff Size: Normal)   Pulse 100   Temp 99.2 F (37.3 C) (Oral)   Resp 12   Ht 5' 4.5" (1.638 m)   Wt 147 lb 1.9 oz (66.7 kg)   LMP 09/16/2017   SpO2 99% Comment: room air  BMI 24.86 kg/m  Last took tylenol at midnight.  Physical Exam  Constitutional: She is oriented to person, place, and time. She appears well-developed and well-nourished.  HENT:  Head: Normocephalic and  atraumatic.  Mouth/Throat: Oropharynx is clear and moist.  Eyes: Pupils are equal, round, and reactive to light. EOM are normal. No scleral icterus.  Neck: Normal range of motion. Neck supple. No neck rigidity. No tracheal deviation present.  Cardiovascular: Normal heart sounds and normal pulses. Tachycardia present.  Pulmonary/Chest: Effort normal. No accessory muscle usage or stridor. She has no decreased breath sounds. She has no wheezes. She has rhonchi in the right middle field and the right lower field.  Abdominal: Soft. Normal appearance and bowel sounds are normal. There is tenderness in the suprapubic area.  Lymphadenopathy:    She has no cervical adenopathy.  Neurological: She is alert and oriented to person, place, and time. She has normal strength.  Skin: Skin is warm and dry. Petechiae noted.  Petechiae present on upper extremities and chest wall .     Assessment and Plan  1. Febrile illness 2. Cough 3. Pregnancy related bilateral lower abdominal cramping, antepartum 4. Petechiae    42 year old female with history of liver disease and thrombocytopenia presenting with tachycardia, petechiae, fever and persistent cough.  Recently treated with several courses of antibiotics for bronchitis now presenting with rhonchi and systemic symptoms and lower abdominal cramping with evidence of petechiae on her upper extremities. Long discussion with patient in office today regarding her symptoms.  She has been to the urgent care and contacted her OB/GYN within the past 24 hours.  She presents today to our office because she is continuing to feel worse.  She has had a decrease in appetite and been lightheaded and presyncopal over the past several days.  She does report decreased ability to take p.o.'s and decreased urine output. Patient refuses EMS transport to the emergency department.  She does report that she is lightheaded upon standing.  She understands that  this is AGAINST MEDICAL ADVICE  but would like to go to the emergency department via private vehicle due to expense. Discussed with patient that I do believe she needs evaluation in the emergency department and labs and additional laboratory testing done.  We did discuss that she will need a platelet count, kidney test, urinalysis, and evaluation for her lower abdominal cramping.  She is hesitant to obtain a chest x-ray due to the fact that she is pregnant but she is febrile with recurrent cough and lung findings on examination.  In addition she has failed several courses of antibiotics as an outpatient. Patient is agreeable to going to the emergency department.  She will also follow-up with her OB/GYN and liver specialist as directed on an outpatient basis.  She will also be establishing care with new PCP in the area.  We will call the emergency department and let them know the patient is on her way.  We will plan on checking on her later this afternoon. No follow-ups on file. Aliene Beams, MD 12/08/2017

## 2017-12-08 NOTE — Discharge Instructions (Signed)
Continue taking the antibiotic.  Take Tylenol for pain and follow-up with OB/GYN next week.  Call Dr. Rayna Sexton office to make an appointment.  If you have a problem with bleeding or severe pain then go to [facility] in [place]

## 2017-12-08 NOTE — Telephone Encounter (Signed)
Abby, did you call the ED and inform patient was on the way? If so, please send her a  mychart message and let her know that we did inform them that she was coming via private vehicle and we let them know why she was going to the ED. Janine Limbo. [name], MD

## 2017-12-09 ENCOUNTER — Telehealth: Payer: Self-pay | Admitting: Family Medicine

## 2017-12-09 NOTE — Telephone Encounter (Signed)
Patient requesting a call back to discuss recent labs  [phone]

## 2017-12-09 NOTE — Telephone Encounter (Signed)
Patient requesting results

## 2017-12-13 NOTE — Telephone Encounter (Signed)
Please call patient and see what she needs. I have not drawn any recent labs on the patient. Kara Stewart. [name], MD .

## 2017-12-14 DIAGNOSIS — Z3A13 13 weeks gestation of pregnancy: Secondary | ICD-10-CM | POA: Diagnosis not present

## 2017-12-14 DIAGNOSIS — O09521 Supervision of elderly multigravida, first trimester: Secondary | ICD-10-CM | POA: Diagnosis not present

## 2017-12-14 DIAGNOSIS — Z3689 Encounter for other specified antenatal screening: Secondary | ICD-10-CM | POA: Diagnosis not present

## 2017-12-14 DIAGNOSIS — Z36 Encounter for antenatal screening for chromosomal anomalies: Secondary | ICD-10-CM | POA: Diagnosis not present

## 2017-12-15 NOTE — Telephone Encounter (Signed)
Called to discuss patient's message. No answer. Left message requesting call back.

## 2017-12-16 NOTE — Telephone Encounter (Signed)
Patient would like to know the results of the lab work that was drawn while she was in the hospital. She stated that no one ever told her the results while she was in there.

## 2017-12-17 ENCOUNTER — Telehealth: Payer: Self-pay | Admitting: Family Medicine

## 2017-12-17 NOTE — Telephone Encounter (Signed)
Patient left voicemail returning your call about test results  Cb#: [phone]

## 2017-12-17 NOTE — Telephone Encounter (Signed)
Labs and urine were basically  Normal. Platelets were low, but have been for quite some time due to liver issus. Blood counts were slightly anemic. Will need to follow up with gynecology regarding her anemia and labs. Urine test was normal.  Janine Limboachel H. Tracie HarrierHagler, MD

## 2017-12-21 ENCOUNTER — Encounter: Payer: Self-pay | Admitting: Family Medicine

## 2017-12-21 NOTE — Telephone Encounter (Signed)
Spoke with patient and gave results of lab testing performed at hospital with verbal understanding.

## 2017-12-21 NOTE — Telephone Encounter (Signed)
Results given with verbal understanding.

## 2018-01-11 DIAGNOSIS — Z3402 Encounter for supervision of normal first pregnancy, second trimester: Secondary | ICD-10-CM | POA: Diagnosis not present

## 2018-01-11 DIAGNOSIS — Z23 Encounter for immunization: Secondary | ICD-10-CM | POA: Diagnosis not present

## 2018-01-12 ENCOUNTER — Telehealth: Payer: Self-pay | Admitting: Gastroenterology

## 2018-01-12 NOTE — Telephone Encounter (Signed)
LMOM, LETTING PT KNOW THAT HER ORDERS ARE IN FOR [facility] AND SHE DOESN'T NEEDS THE ORDERS.

## 2018-01-12 NOTE — Telephone Encounter (Signed)
Pt said she lost her lab orders and wanted to have them done at [facility].. She wanted to make sure they had the orders as well. [phone]

## 2018-01-13 ENCOUNTER — Other Ambulatory Visit (HOSPITAL_COMMUNITY): Payer: Self-pay | Admitting: Unknown Physician Specialty

## 2018-01-13 DIAGNOSIS — Z3689 Encounter for other specified antenatal screening: Secondary | ICD-10-CM

## 2018-01-17 DIAGNOSIS — K769 Liver disease, unspecified: Secondary | ICD-10-CM | POA: Diagnosis not present

## 2018-01-18 ENCOUNTER — Ambulatory Visit (HOSPITAL_COMMUNITY): Payer: Medicaid Other

## 2018-01-18 DIAGNOSIS — N898 Other specified noninflammatory disorders of vagina: Secondary | ICD-10-CM | POA: Diagnosis not present

## 2018-01-18 DIAGNOSIS — O26892 Other specified pregnancy related conditions, second trimester: Secondary | ICD-10-CM | POA: Diagnosis not present

## 2018-01-18 LAB — BASIC METABOLIC PANEL
BUN/Creatinine Ratio: 19 (ref 9–23)
BUN: 10 mg/dL (ref 6–24)
CALCIUM: 8.6 mg/dL — AB (ref 8.7–10.2)
CO2: 20 mmol/L (ref 20–29)
Chloride: 104 mmol/L (ref 96–106)
Creatinine, Ser: 0.54 mg/dL — ABNORMAL LOW (ref 0.57–1.00)
GFR, EST AFRICAN AMERICAN: 136 mL/min/{1.73_m2} (ref >59)
GFR, EST NON AFRICAN AMERICAN: 118 mL/min/{1.73_m2} (ref >59)
Glucose: 86 mg/dL (ref 65–99)
Potassium: 4.1 mmol/L (ref 3.5–5.2)
Sodium: 139 mmol/L (ref 134–144)

## 2018-01-18 LAB — HEPATIC FUNCTION PANEL
ALBUMIN: 3.8 g/dL (ref 3.5–5.5)
ALT: 11 IU/L (ref 0–32)
AST: 15 IU/L (ref 0–40)
Alkaline Phosphatase: 58 IU/L (ref 39–117)
BILIRUBIN TOTAL: 0.5 mg/dL (ref 0.0–1.2)
Bilirubin, Direct: 0.16 mg/dL (ref 0.00–0.40)
Total Protein: 6.1 g/dL (ref 6.0–8.5)

## 2018-01-18 LAB — CBC WITH DIFFERENTIAL/PLATELET
BASOS ABS: 0 10*3/uL (ref 0.0–0.2)
Basos: 1 %
EOS (ABSOLUTE): 0.1 10*3/uL (ref 0.0–0.4)
EOS: 2 %
HEMOGLOBIN: 11.8 g/dL (ref 11.1–15.9)
Hematocrit: 34.1 % (ref 34.0–46.6)
IMMATURE GRANS (ABS): 0 10*3/uL (ref 0.0–0.1)
Immature Granulocytes: 1 %
LYMPHS: 16 %
Lymphocytes Absolute: 1.1 10*3/uL (ref 0.7–3.1)
MCH: 30.5 pg (ref 26.6–33.0)
MCHC: 34.6 g/dL (ref 31.5–35.7)
MCV: 88 fL (ref 79–97)
MONOCYTES: 5 %
Monocytes Absolute: 0.3 10*3/uL (ref 0.1–0.9)
NEUTROS ABS: 5 10*3/uL (ref 1.4–7.0)
Neutrophils: 75 %
Platelets: 114 10*3/uL — ABNORMAL LOW (ref 150–450)
RBC: 3.87 x10E6/uL (ref 3.77–5.28)
RDW: 13.3 % (ref 12.3–15.4)
WBC: 6.6 10*3/uL (ref 3.4–10.8)

## 2018-01-18 LAB — MITOCHONDRIAL ANTIBODIES: Mitochondrial Ab: 42.8 Units — ABNORMAL HIGH (ref 0.0–20.0)

## 2018-01-18 LAB — PROTIME-INR
INR: 1 (ref 0.8–1.2)
Prothrombin Time: 10.4 s (ref 9.1–12.0)

## 2018-01-19 ENCOUNTER — Encounter: Payer: Self-pay | Admitting: Gastroenterology

## 2018-01-19 ENCOUNTER — Ambulatory Visit (INDEPENDENT_AMBULATORY_CARE_PROVIDER_SITE_OTHER): Payer: Medicaid Other | Admitting: Gastroenterology

## 2018-01-19 VITALS — BP 110/68 | HR 78 | Temp 97.1°F | Ht 64.5 in | Wt 155.4 lb

## 2018-01-19 DIAGNOSIS — K769 Liver disease, unspecified: Secondary | ICD-10-CM

## 2018-01-19 NOTE — Progress Notes (Signed)
Primary Care Physician: Aliene Beams, MD  Primary Gastroenterologist:  Jonette Eva, MD   Chief Complaint  Patient presents with  . Hepatic Disease  . naval pain    d/t hernia  . Nausea    no vomiting  . Abdominal Pain    all over (not related to pregnancy)    HPI: Kara Stewart is a 41 y.o. female here approximately [redacted] weeks pregnant here for follow-up of chronic liver disease.  She has a history of alcoholic hepatitis, elevated AMA empirically treated with Urso over the past year, known severe hepatic steatosis with hepatosplenomegaly with likely chronic liver disease complicated by ascites requiring abdominal paracenteses back in July 2018.  No alcohol since March 2018.    We last saw her in August.  At that time she was [redacted] weeks pregnant.  She had stopped her URSO, aldactone, lasix.  She had at least 3 miscarriages in 2018 and really desires this pregnancy.  She has some nausea related to pregnancy but no vomiting.  She has some pain off and on around her navel worse with sneezing/coughing, movement.  This has been stable.  Bowel movements are good.  No blood in the stool or melena.  No heartburn.  She has had some increased bruising and itching recently.  She has been off Urso this pregnancy.  Her regular OB sending her to maternal-fetal specialist in [place], has an appointment on October 24.  Her primary OB is given approval for use of her so.  Patient would like to wait and discuss with maternal-fetal specialist.  She will let me know what she decides.  Requested that they send records to [place] as available.  Current Outpatient Medications  Medication Sig Dispense Refill  . albuterol (PROVENTIL HFA;VENTOLIN HFA) 108 (90 Base) MCG/ACT inhaler Inhale 2 puffs into the lungs every 6 (six) hours as needed for wheezing or shortness of breath. 1 Inhaler 0  . doxycycline (DORYX) 150 MG EC tablet Take 150 mg by mouth 4 (four) times daily. X 10 days    . Prenatal Vit-Fe Fumarate-FA  (PRENATAL VITAMIN PO) Take 1 tablet by mouth daily.      No current facility-administered medications for this visit.     Allergies as of 01/19/2018 - Review Complete 01/19/2018  Allergen Reaction Noted  . Penicillins Anaphylaxis 10/18/2011  . Lidocaine Other (See Comments) 06/22/2014    ROS:  General: Negative for anorexia, weight loss, fever, chills, fatigue, weakness.  See HPI ENT: Negative for hoarseness, difficulty swallowing , nasal congestion. CV: Negative for chest pain, angina, palpitations, dyspnea on exertion, peripheral edema.  Respiratory: Negative for dyspnea at rest, dyspnea on exertion, cough, sputum, wheezing.  GI: See history of present illness. GU:  Negative for dysuria, hematuria, urinary incontinence, urinary frequency, nocturnal urination.  Endo: Negative for unusual weight change.    Physical Examination:   BP 110/68   Pulse 78   Temp (!) 97.1 F (36.2 C) (Oral)   Ht 5' 4.5" (1.638 m)   Wt 155 lb 6.4 oz (70.5 kg)   LMP 09/16/2017   BMI 26.26 kg/m   General: Well-nourished, well-developed in no acute distress.  Eyes: No icterus. Mouth: Oropharyngeal mucosa moist and pink , no lesions erythema or exudate. Lungs: Clear to auscultation bilaterally.  Heart: Regular rate and rhythm, no murmurs rubs or gallops.  Abdomen: Bowel sounds are normal, nontender, lower abdominal distention due to pregnancy, no hepatosplenomegaly or masses, no abdominal bruits or hernia , no rebound  or guarding.   Extremities: No lower extremity edema. No clubbing or deformities. Neuro: Alert and oriented x 4   Skin: Warm and dry, no jaundice.   Psych: Alert and cooperative, normal mood and affect.  Labs:  Lab Results  Component Value Date   CREATININE 0.54 (L) 01/17/2018   BUN 10 01/17/2018   NA 139 01/17/2018   K 4.1 01/17/2018   CL 104 01/17/2018   CO2 20 01/17/2018   Lab Results  Component Value Date   WBC 6.6 01/17/2018   HGB 11.8 01/17/2018   HCT 34.1 01/17/2018    MCV 88 01/17/2018   PLT 114 (L) 01/17/2018   Lab Results  Component Value Date   ALT 11 01/17/2018   AST 15 01/17/2018   ALKPHOS 58 01/17/2018   BILITOT 0.5 01/17/2018   Lab Results  Component Value Date   INR 1.0 01/17/2018   INR 1.3 (H) 12/23/2016   INR 1.2 (H) 11/03/2016   AMA 42.8 Imaging Studies: No results found.

## 2018-01-19 NOTE — Patient Instructions (Signed)
1. Please discuss use of URSO with the maternal fetal specialist next week at your appointment. 2. Labs in four weeks.

## 2018-01-24 ENCOUNTER — Encounter (HOSPITAL_COMMUNITY): Payer: Self-pay

## 2018-01-24 ENCOUNTER — Encounter (HOSPITAL_COMMUNITY): Payer: Medicaid Other

## 2018-01-24 ENCOUNTER — Ambulatory Visit (HOSPITAL_COMMUNITY): Payer: Medicaid Other

## 2018-01-24 NOTE — Progress Notes (Signed)
Noted  

## 2018-01-25 NOTE — Assessment & Plan Note (Signed)
42 year old female with history of alcoholic hepatitis complicated by ascites requiring paracentesis (2018), positive AMA on empiric Urso who presents for follow-up, currently [redacted] weeks pregnant.  She has been off Lasix, Aldactone, and URSO since she found out about her pregnancy.  She has not had any significant edema.  She complains of recent itching and bruising.  She is concerned about state of her liver.  She has an appointment with maternal-fetal specialist October 24, she prefers to discuss use of URSO with them before beginning even though her primary OB said it would be okay.  She will let me know her decision.  Patient has been abstinent from alcohol use since early 2018.  She is high risk pregnancy due to her chronic liver disease and will require close monitoring from liver standpoint.  We will update her labs in the near future.  Will likely follow labs at least every 3 to 4 weeks.  She continues to have some periumbilical abdominal pain, stable, likely musculoskeletal versus early hernia.  We will continue to monitor.

## 2018-01-25 NOTE — Progress Notes (Signed)
PT was given her lab orders at office visit. I called and confirmed and she is aware to go to the lab around 02/19/2018.

## 2018-01-25 NOTE — Progress Notes (Signed)
CC'ED TO PCP 

## 2018-01-27 ENCOUNTER — Ambulatory Visit (HOSPITAL_COMMUNITY)
Admission: RE | Admit: 2018-01-27 | Discharge: 2018-01-27 | Disposition: A | Discharge status: Home / Self Care | Payer: Medicaid Other | Source: Ambulatory Visit | Attending: Unknown Physician Specialty | Admitting: Unknown Physician Specialty

## 2018-01-27 ENCOUNTER — Telehealth: Payer: Self-pay | Admitting: Gastroenterology

## 2018-01-27 ENCOUNTER — Encounter (HOSPITAL_COMMUNITY): Payer: Self-pay

## 2018-01-27 ENCOUNTER — Other Ambulatory Visit (HOSPITAL_COMMUNITY): Payer: Self-pay | Admitting: *Deleted

## 2018-01-27 DIAGNOSIS — F1910 Other psychoactive substance abuse, uncomplicated: Secondary | ICD-10-CM | POA: Insufficient documentation

## 2018-01-27 DIAGNOSIS — O283 Abnormal ultrasonic finding on antenatal screening of mother: Secondary | ICD-10-CM

## 2018-01-27 DIAGNOSIS — O09512 Supervision of elderly primigravida, second trimester: Secondary | ICD-10-CM | POA: Diagnosis not present

## 2018-01-27 DIAGNOSIS — O09522 Supervision of elderly multigravida, second trimester: Secondary | ICD-10-CM | POA: Diagnosis not present

## 2018-01-27 DIAGNOSIS — O26612 Liver and biliary tract disorders in pregnancy, second trimester: Secondary | ICD-10-CM

## 2018-01-27 DIAGNOSIS — Z3689 Encounter for other specified antenatal screening: Secondary | ICD-10-CM

## 2018-01-27 DIAGNOSIS — F1099 Alcohol use, unspecified with unspecified alcohol-induced disorder: Secondary | ICD-10-CM | POA: Insufficient documentation

## 2018-01-27 DIAGNOSIS — O99322 Drug use complicating pregnancy, second trimester: Secondary | ICD-10-CM | POA: Insufficient documentation

## 2018-01-27 DIAGNOSIS — O99312 Alcohol use complicating pregnancy, second trimester: Secondary | ICD-10-CM | POA: Insufficient documentation

## 2018-01-27 DIAGNOSIS — Z3A19 19 weeks gestation of pregnancy: Secondary | ICD-10-CM | POA: Insufficient documentation

## 2018-01-27 DIAGNOSIS — K769 Liver disease, unspecified: Secondary | ICD-10-CM

## 2018-01-27 DIAGNOSIS — Z362 Encounter for other antenatal screening follow-up: Secondary | ICD-10-CM

## 2018-01-27 MED ORDER — URSODIOL 500 MG PO TABS: 500.0000 mg | ORAL_TABLET | Freq: Two times a day (BID) | ORAL | 5 refills | Status: DC

## 2018-01-27 NOTE — Telephone Encounter (Signed)
PT is aware. She has her lab orders to do when due. Forwarding to Stacy to schedule the OV appt.

## 2018-01-27 NOTE — Consult Note (Signed)
Maternal-Fetal Medicine  MRN: [id number] Requesting Provider: Ruthy Dick, MD  I had the pleasure of seeing Kara [facility]. She is a G2 P0010 at 19-weeks' gestation and is here for fetal anatomy scan and consultation.  She has chronic liver disease. Patient reports this diagnosis was made in March 2018 when she had weakness, dizziness and blackout. She was in her early pregnancy and on evaluation at [facility], liver disease was confirmed. She was informed that she had alcoholic hepatitis. Patient had stopped drinking since March 2018. In July 2018 she had CT abdomien that showe dmild ascites and stable hepatomegaly and mild splenomegaly. In Sept 2018, she had paracentesis and fluid analysis was normal (no malignancy).  Her recent LFTs and bilirubin levels are within normal range and PT/INR are within normal range. She had c/o itching and had improvement with urso. She had discontinued this medication at the onset of pregnancy and reports her itching had become worse. 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  She does not have hypertension or diabetes or any other chronic medical conditions.  PSH: Oral surgery, deviated nasal septum surgery. Allergies: Penicillin (anaphylaxis-"throat swells up), lidocaine (from EPIC). Medications: Prenatal vitamins. Social: Ex-smoker 5 cigarettes per day and quit 6 months ago. No alcohol use. Recent use of marijuana (UTOX positive). Her partner is an Tree surgeon and he is in good health. Family: Father has hypertension and history of DVT. Obstetric history: 1 early SAB. She gives history of another possible chemical pregnancy (unsure). Gyn history: No history of cervical surgeries. History of irregular periods. This is a natural conception. Prenatal course: On cell-free fetal DNA screening, the risks of fetal aneuploidies are not increased. On  first-trimester ultrasound, the nuchal-translucency measurement was normal (2.2 mm).  Labs:  01/17/18: Hb 11.8, Hct 34.1, WBC 6.6, PLT 114. ALT 15, AST 11, T bil0.5, D bil 0.16, PT/INR 10.4/1, glucose 86. Antimitochondrial antibodies are increased. Previously, endoscopy, colonoscopy, abd MRI, thyroid ultrasound were performed and did not show abnormalities. Ultrasound (today): We performed a fetal anatomy scan. An echogenic intracardiac focus is seen in the left ventricle. No other markers of aneuploidies or fetal structural defects are seen. Fetal biometry is consistent with her previously-established dates. Amniotic fluid is normal and good fetal activity is seen. Patient understands the limitations of ultrasound in detecting fetal anomalies.   I counseled the patient on the following: Chronic liver disease: I discussed with [name], PA-C, today at [facility]. Chronic liver disease is presumed to be due to previous use of alcohol. Increased mitochondrial antibodies raises the suspicion of cholangitis, but invasive testing is planned for now. Biopsy is deferred. Their recommendation would be to continue URSO. I informed the patient that URSO can be used in pregnancy safely and fetal risk is negligible. She will also find symptomatic improvement. In my discussion with her even Tylenol (reasonably low dose) can be given. Patient reports she had quit drinking at least from March 2018 and her LFTs are within normal range. In the absence of continued use of alcohol, I do not expect any adverse fetal outcomes. However, I encouraged her to continue her appointments with GI to monitor her liver function tests.  I am unclear about the etiology of thrombocytopenia that preceded this pregnancy. We will monitor platelets and refer to hematologist as needed. I explained that thrombocytopenia can potentially pose challenges to regional analgesia in labor.  Advanced maternal age:  I  informed that AMA is associated with an increase in fetal aneuploidies. I discussed the significance and limitations of cell-free fetal DNA screening that analyzes fetal DNA in maternal blood for trisomies 21, 18 and 13, and, which has a greater detection rate than conventional screening tests. I informed the patient that not all chromosomal malformations are detected by this test. I informed her that only invasive tests including chorion villus sampling or amniocentesis will give a definitive result on the fetal karyotype. I informed her that pregnancy loss from the procedure is about 1 in 400.  AMA>40: I also informed the patient that in women over 43 years of age, there is a small increase in stillbirth rates (absolute number is very small) late in pregnancy. We, therefore, recommend weekly antenatal testing from 36 weeks until delivery.  Echogenic intracardiac focus:  I informed the patient that given that she had low rik for fetal aneuploidies on cell-free fetal DNA screening, finding of echogenic intracardiac focus should be considered a normal variant and that the risk of trisomy 21 is not increased. I also reassured that echogenic focus does not increase the risk of cardiac defects.  After counseling, the patient opted not to have amniocentesis.  Recommendations: -An appointment was made for her to return in 4 weeks for fetal growth. -Serial fetal growth assessments every 4 weeks. -Continue consultation with GI. -Transfer [facility] to [facility] at [facility].  Thank you for your consult. Please do not hesitate to contact me if you have any questions or concerns.  Consultation including face-to-face counseling: 40 min.

## 2018-01-27 NOTE — Telephone Encounter (Signed)
Please let patient know that I spoke to Dr. Judeth Cornfield, fetal maternal specialist regarding her Urso.  Safe to resume Urso during pregnancy.  I have sent a prescription for 500 mg twice [facility] to [facility] [place].  We are scheduled to have blood work done in 3 to 4 weeks. Months bring her back in the office in 2 months, may use urgent

## 2018-01-27 NOTE — Telephone Encounter (Signed)
LMOM to call.

## 2018-01-28 ENCOUNTER — Encounter: Payer: Self-pay | Admitting: Gastroenterology

## 2018-01-28 NOTE — Telephone Encounter (Signed)
PATIENT SCHEDULED AND LETTER SENT  °

## 2018-02-02 ENCOUNTER — Ambulatory Visit: Payer: Self-pay | Admitting: Gastroenterology

## 2018-02-02 ENCOUNTER — Encounter

## 2018-02-17 ENCOUNTER — Other Ambulatory Visit (HOSPITAL_COMMUNITY): Payer: Self-pay

## 2018-02-24 ENCOUNTER — Encounter (HOSPITAL_COMMUNITY): Payer: Self-pay

## 2018-02-24 ENCOUNTER — Ambulatory Visit (HOSPITAL_COMMUNITY)
Admission: RE | Admit: 2018-02-24 | Discharge: 2018-02-24 | Disposition: A | Discharge status: Home / Self Care | Payer: Medicaid Other | Source: Ambulatory Visit | Attending: Unknown Physician Specialty | Admitting: Unknown Physician Specialty

## 2018-02-24 DIAGNOSIS — O99322 Drug use complicating pregnancy, second trimester: Secondary | ICD-10-CM

## 2018-02-24 DIAGNOSIS — F1099 Alcohol use, unspecified with unspecified alcohol-induced disorder: Secondary | ICD-10-CM | POA: Diagnosis not present

## 2018-02-24 DIAGNOSIS — O09512 Supervision of elderly primigravida, second trimester: Secondary | ICD-10-CM

## 2018-02-24 DIAGNOSIS — Z3A23 23 weeks gestation of pregnancy: Secondary | ICD-10-CM

## 2018-02-24 DIAGNOSIS — F1910 Other psychoactive substance abuse, uncomplicated: Secondary | ICD-10-CM | POA: Diagnosis not present

## 2018-02-24 DIAGNOSIS — O99312 Alcohol use complicating pregnancy, second trimester: Secondary | ICD-10-CM | POA: Diagnosis not present

## 2018-02-24 DIAGNOSIS — Z362 Encounter for other antenatal screening follow-up: Secondary | ICD-10-CM

## 2018-02-25 ENCOUNTER — Other Ambulatory Visit (HOSPITAL_COMMUNITY): Payer: Self-pay | Admitting: *Deleted

## 2018-02-25 DIAGNOSIS — O09522 Supervision of elderly multigravida, second trimester: Secondary | ICD-10-CM

## 2018-03-10 DIAGNOSIS — Z23 Encounter for immunization: Secondary | ICD-10-CM | POA: Diagnosis not present

## 2018-03-15 DIAGNOSIS — Z3689 Encounter for other specified antenatal screening: Secondary | ICD-10-CM | POA: Diagnosis not present

## 2018-03-23 ENCOUNTER — Encounter: Payer: Self-pay | Admitting: Gastroenterology

## 2018-03-23 ENCOUNTER — Ambulatory Visit: Payer: Medicaid Other | Admitting: Gastroenterology

## 2018-03-23 VITALS — BP 114/65 | HR 93 | Temp 97.0°F | Ht 64.5 in | Wt 174.8 lb

## 2018-03-23 DIAGNOSIS — K769 Liver disease, unspecified: Secondary | ICD-10-CM | POA: Diagnosis not present

## 2018-03-23 NOTE — Progress Notes (Signed)
Primary Care Physician: Aliene Beams, MD  Primary Gastroenterologist:  Jonette Eva, MD   Chief Complaint  Patient presents with  . chronic liver disease    "same symptoms"    HPI: Kara Stewart is a 42 y.o. female here for follow-up.  She was last seen on January 19, 2018.  She has a history of chronic liver disease, alcoholic hepatitis, elevated AMA empirically treated with Urso.  Known severe hepatic steatosis with hepatosplenomegaly with likely chronic liver disease complicated by ascites requiring abdominal paracenteses back in July 2018.  No alcohol since March 2018.  She is being followed by Dr. Judeth Cornfield, fetal maternal specialist.  I spoke with Dr. Judeth Cornfield 01/27/2018.  He advised it was safe to resume Urso during her pregnancy.  She started back on 500 mg twice daily.  Plans to monitor her LFTs during her pregnancy.  Patient is now [redacted]w[redacted]d gestation of pregnancy.  Now has developed a cough, started after back on URSO two months ago. Became gradual. When gave vaccine for Tdap cough got much worse. Especially worse at night, if pressure on throat. Laying down feels like fluid in throat. sometimes has to use inhaler to stop cough and get air. No post nasal drip. BM daily.  No abdominal pain.  No blood in the stool or melena.  Continues to have diffuse itching off and on.  No improvement on Urso.  Transferring her general OB care to [facility] so she can deliver at [facility].  She is due for her labs now, she thought she was having it done when she went to [facility] in the evening but they did only her OB labs.  She complains of some lower extremity edema worse after days she is required to be on her feet more.  Current Outpatient Medications  Medication Sig Dispense Refill  . albuterol (PROVENTIL HFA;VENTOLIN HFA) 108 (90 Base) MCG/ACT inhaler Inhale 2 puffs into the lungs every 6 (six) hours as needed for wheezing or shortness of breath. 1 Inhaler 0  . Prenatal Vit-Fe  Fumarate-FA (PRENATAL VITAMIN PO) Take 1 tablet by mouth daily.     . ursodiol (ACTIGALL) 500 MG tablet Take 1 tablet (500 mg total) by mouth 2 (two) times daily. 60 tablet 5   No current facility-administered medications for this visit.     Allergies as of 03/23/2018 - Review Complete 03/23/2018  Allergen Reaction Noted  . Penicillins Anaphylaxis 10/18/2011  . Lidocaine Other (See Comments) 06/22/2014    ROS:  General: Negative for anorexia, weight loss, fever, chills, fatigue, weakness. ENT: Negative for hoarseness, difficulty swallowing , nasal congestion. CV: Negative for chest pain, angina, palpitations, dyspnea on exertion, positive peripheral edema.  Respiratory: Negative for dyspnea at rest, dyspnea on exertion, cough, sputum, wheezing.  GI: See history of present illness. GU:  Negative for dysuria, hematuria, urinary incontinence, urinary frequency, nocturnal urination.  Endo: Negative for unusual weight change.    Physical Examination:   BP 114/65   Pulse 93   Temp (!) 97 F (36.1 C) (Oral)   Ht 5' 4.5" (1.638 m)   Wt 174 lb 12.8 oz (79.3 kg)   LMP 09/16/2017 (Approximate)   BMI 29.54 kg/m   General: Well-nourished, well-developed in no acute distress.  Eyes: No icterus. Mouth: Oropharyngeal mucosa moist and pink , no lesions erythema or exudate. Lungs: Clear to auscultation bilaterally.  Heart: Regular rate and rhythm, no murmurs rubs or gallops.  Abdomen: Bowel sounds are normal, nontender, distended from pregnancy,  very soft upper abdomen.    Extremities: Trace lower extremity edema. No clubbing or deformities. Neuro: Alert and oriented x 4   Skin: Warm and dry, no jaundice.   Psych: Alert and cooperative, normal mood and affect.  Labs:  03/15/2018: White blood cell count 9700, hemoglobin 12.1, hematocrit 35, platelets 130,000 stable to improved Imaging Studies: [place] Mfm Ob Follow Up  Result Date:  02/24/2018 ----------------------------------------------------------------------  OBSTETRICS REPORT                       (Signed Final 02/24/2018 04:30 pm) ---------------------------------------------------------------------- Patient Info  ID #:       [id number]                          D.O.B.:  13-Apr-1975 (41 yrs)  Name:       Kara Stewart                  Visit Date: 02/24/2018 03:08 pm ---------------------------------------------------------------------- Performed By  Performed By:     Lenise Arena,       Ref. Address:     [facility]                                                             [address], [address]                                                             [place], [place] [address]  Attending:        Noralee Space MD        Location:         [facility]  Referred By:      Ruthy Dick                    MD ---------------------------------------------------------------------- Orders   #  Description                          Code         Ordered By   1  [place] MFM OB FOLLOW UP  [id number]     RAVI SHANKAR  ----------------------------------------------------------------------   #  Order #                    Accession #                 Episode #   1  [id number]                  [id number]                  [id number]  ---------------------------------------------------------------------- Indications   Advanced maternal age primigravida 6035+,        68O09.512   second trimester   Substance abuse affecting pregnancy,           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 Cord Insertion:      Previously Visualized  Amniotic Fluid  AFI FV:      Within normal limits                              Largest Pocket(cm)                              4.9 ---------------------------------------------------------------------- Biometry  BPD:      55.6  mm     G. Age:  23w 0d         44  %    CI:         76.7   %    70 - 86                                                          FL/HC:      20.6   %    19.2 - 20.8  HC:      201.1  mm     G. Age:  22w 2d         12  %    HC/AC:      1.04        1.05 - 1.21  AC:      193.9  mm     G. Age:  24w 1d         76  %    FL/BPD:     74.5   %    71 - 87  FL:       41.4  mm     G. Age:  23w 3d         54  %  FL/AC:      21.4   %    20 - 24  HUM:      37.7  mm     G. Age:  23w 2d         47  %  LV:        7.1  mm  Est. FW:     612  gm      1 lb 6 oz     63  % ---------------------------------------------------------------------- OB History  Gravidity:    3         Term:   0        Prem:   0        SAB:   2  TOP:          0       Ectopic:  0        Living: 0 ---------------------------------------------------------------------- Gestational Age  LMP:           23w 0d        Date:  09/16/17                 EDD:   06/23/18  U/S Today:     23w 2d                                        EDD:   06/21/18  Best:          23w 0d     Det. By:  LMP  (09/16/17)          EDD:   06/23/18 ---------------------------------------------------------------------- Anatomy  Cranium:               Appears normal         LVOT:                   Previously seen  Cavum:                 Appears normal         Aortic Arch:            Previously seen  Ventricles:            Appears normal         Ductal Arch:            Previously seen  Choroid Plexus:        Previously seen        Diaphragm:              Appears normal  Cerebellum:             Previously seen        Stomach:                Appears normal, left                                                                        sided  Posterior Fossa:       Previously seen        Abdomen:  Appears normal  Nuchal Fold:           Previously seen        Abdominal Wall:         Previously seen  Face:                  Orbits and profile     Cord Vessels:           Previously seen                         previously seen  Lips:                  Previously seen        Kidneys:                Appear normal  Palate:                Previously seen        Bladder:                Appears normal  Thoracic:              Appears normal         Spine:                  Previously seen  Heart:                 Appears normal         Upper Extremities:      Previously seen                         (4CH, axis, and si  RVOT:                  Appears normal         Lower Extremities:      Previously seen  Other:  Nasal bone previously visualized. Open hands previously visualized.          5th digit previously visualized. Heels previously visualized. Fetus          appears to be female. ---------------------------------------------------------------------- Cervix Uterus Adnexa  Cervix  Length:           4.55  cm.  Normal appearance by transabdominal scan. ---------------------------------------------------------------------- Impression  Chronic liver disease. Patient returns for fetal growth  assessment. She takes urso now. Patient desires delivery at  [facility] [place]. She has not had follow-up  appointment with you.  Fetal growth is appropriate for gestational age. Amniotic fluid  is normal and good fetal activity is seen. Cardiac anatomy  appears normal.  I reassured the patient of the findings. She was informed to  contact your office for referral to Faculty Practice or other  obstetricians having privileges at [facility]. ----------------------------------------------------------------------  Recommendations  An appointment was made for her to return in 4 weeks for  fetal growth assessment. ----------------------------------------------------------------------                  Noralee Space, MD Electronically Signed Final Report   02/24/2018 04:30 pm ----------------------------------------------------------------------

## 2018-03-23 NOTE — Patient Instructions (Signed)
Please have your labs done. We will contact you with results within 5 business days.   Continue Urso 500mg  twice daily.

## 2018-03-23 NOTE — Assessment & Plan Note (Signed)
42 year old female with history of alcoholic hepatitis complicated by ascites requiring paracentesis (2018), positive AMA on empiric Urso (resumed 2 months ago after advised by Dr. Judeth CornfieldShankar).  She is almost [redacted] weeks pregnant at this point.  She continues to have diffuse itching, no bruising or bleeding.  Intermittent lower extremity edema although no significant edema on exam today.  No alcohol use since early 2018.  She is overdue for follow-up labs to evaluate chronic liver disease.  [place], she will let me know the name of practice and her provider so that we can continue to communicate with her OB.  Further recommendations to follow upcoming labs.  Patient complains of 1957-month history of gradual onset coughing.  Cannot exclude coughing related to reflux.  She should discuss with her OB tomorrow regarding possible medications that can be given for reflux and/or further evaluation coughing.

## 2018-03-24 ENCOUNTER — Encounter (HOSPITAL_COMMUNITY): Payer: Self-pay

## 2018-03-24 ENCOUNTER — Ambulatory Visit (HOSPITAL_COMMUNITY)
Admission: RE | Admit: 2018-03-24 | Discharge: 2018-03-24 | Disposition: A | Discharge status: Home / Self Care | Payer: Medicaid Other | Source: Ambulatory Visit | Attending: Unknown Physician Specialty | Admitting: Unknown Physician Specialty

## 2018-03-24 DIAGNOSIS — O09512 Supervision of elderly primigravida, second trimester: Secondary | ICD-10-CM | POA: Insufficient documentation

## 2018-03-24 DIAGNOSIS — Z3A27 27 weeks gestation of pregnancy: Secondary | ICD-10-CM | POA: Diagnosis not present

## 2018-03-24 DIAGNOSIS — F1099 Alcohol use, unspecified with unspecified alcohol-induced disorder: Secondary | ICD-10-CM | POA: Insufficient documentation

## 2018-03-24 DIAGNOSIS — F1910 Other psychoactive substance abuse, uncomplicated: Secondary | ICD-10-CM | POA: Diagnosis not present

## 2018-03-24 DIAGNOSIS — O99312 Alcohol use complicating pregnancy, second trimester: Secondary | ICD-10-CM

## 2018-03-24 DIAGNOSIS — O09522 Supervision of elderly multigravida, second trimester: Secondary | ICD-10-CM

## 2018-03-24 DIAGNOSIS — O99322 Drug use complicating pregnancy, second trimester: Secondary | ICD-10-CM | POA: Insufficient documentation

## 2018-03-24 NOTE — Progress Notes (Signed)
CC'D TO PCP °

## 2018-03-25 ENCOUNTER — Other Ambulatory Visit (HOSPITAL_COMMUNITY): Payer: Self-pay | Admitting: *Deleted

## 2018-03-25 DIAGNOSIS — O09523 Supervision of elderly multigravida, third trimester: Secondary | ICD-10-CM

## 2018-03-31 DIAGNOSIS — O26893 Other specified pregnancy related conditions, third trimester: Secondary | ICD-10-CM | POA: Diagnosis not present

## 2018-03-31 DIAGNOSIS — R109 Unspecified abdominal pain: Secondary | ICD-10-CM | POA: Diagnosis not present

## 2018-04-04 DIAGNOSIS — O26893 Other specified pregnancy related conditions, third trimester: Secondary | ICD-10-CM | POA: Diagnosis not present

## 2018-04-04 DIAGNOSIS — R161 Splenomegaly, not elsewhere classified: Secondary | ICD-10-CM | POA: Diagnosis not present

## 2018-04-04 DIAGNOSIS — Z3A00 Weeks of gestation of pregnancy not specified: Secondary | ICD-10-CM | POA: Diagnosis not present

## 2018-04-04 DIAGNOSIS — R109 Unspecified abdominal pain: Secondary | ICD-10-CM | POA: Diagnosis not present

## 2018-04-06 NOTE — L&D Delivery Note (Addendum)
Patient: Lulia Farr MRN: [id number]  GBS status: Positive, IAP given Vancomycin  Patient is a 43 y.o. now [id number] s/p NSVD at [redacted]w[redacted]d, who was admitted for SOL. SROM at ~2300 prior to delivery with clear fluid.   Delivery Note At 5:11 PM a viable and healthy female was delivered via Vaginal, Spontaneous (Presentation: vertex; ROA ).  APGAR: 9, 9; weight pending.   Placenta status: spontaneous, intact.  3 vessel cord. With no complications.   Anesthesia:  Epidural Episiotomy: None Lacerations: None Suture Repair: None Est. Blood Loss (mL): 32  Head delivered ROA. No nuchal cord present. Shoulder and body delivered in usual fashion. Infant with spontaneous cry, placed on mother's abdomen, dried and bulb suctioned. Cord clamped x 2 after 1-minute delay, and cut by family member. Cord blood drawn. Placenta delivered spontaneously with gentle cord traction. Fundus firm with massage and Pitocin. Perineum inspected and found to have one superficial lacerations to right labia majora which was found to be hemostatic.  Mom to postpartum.  Baby to Couplet care / Skin to Skin.  Kiersten P Mullis 06/15/2018, 5:37 PM  OB FELLOW DELIVERY ATTESTATION  I was gloved and present for the delivery in its entirety, and I agree with the above resident's note.    Gwenevere Abbot, MD  OB Fellow  06/15/2018, 6:25 PM

## 2018-04-20 DIAGNOSIS — Z3403 Encounter for supervision of normal first pregnancy, third trimester: Secondary | ICD-10-CM | POA: Diagnosis not present

## 2018-04-21 ENCOUNTER — Other Ambulatory Visit (HOSPITAL_COMMUNITY): Payer: Self-pay | Admitting: *Deleted

## 2018-04-21 ENCOUNTER — Ambulatory Visit (HOSPITAL_COMMUNITY)
Admission: RE | Admit: 2018-04-21 | Discharge: 2018-04-21 | Disposition: A | Discharge status: Home / Self Care | Payer: Medicaid Other | Source: Ambulatory Visit | Attending: Unknown Physician Specialty | Admitting: Unknown Physician Specialty

## 2018-04-21 ENCOUNTER — Encounter (HOSPITAL_COMMUNITY): Payer: Self-pay

## 2018-04-21 DIAGNOSIS — O09523 Supervision of elderly multigravida, third trimester: Secondary | ICD-10-CM | POA: Diagnosis not present

## 2018-04-21 DIAGNOSIS — O99323 Drug use complicating pregnancy, third trimester: Secondary | ICD-10-CM

## 2018-04-21 DIAGNOSIS — O10919 Unspecified pre-existing hypertension complicating pregnancy, unspecified trimester: Secondary | ICD-10-CM

## 2018-04-21 DIAGNOSIS — Z3A31 31 weeks gestation of pregnancy: Secondary | ICD-10-CM

## 2018-04-21 DIAGNOSIS — O99313 Alcohol use complicating pregnancy, third trimester: Secondary | ICD-10-CM

## 2018-04-25 ENCOUNTER — Ambulatory Visit (HOSPITAL_COMMUNITY): Payer: Medicaid Other

## 2018-04-27 NOTE — Progress Notes (Signed)
LMOM for a return call.  

## 2018-04-28 ENCOUNTER — Encounter: Payer: Self-pay | Admitting: Gastroenterology

## 2018-04-28 ENCOUNTER — Telehealth: Payer: Self-pay | Admitting: Gastroenterology

## 2018-04-28 NOTE — Progress Notes (Signed)
PATIENT SCHEDULED AND CALLED WITH DATE OF APPOINTMENT

## 2018-04-28 NOTE — Telephone Encounter (Signed)
See results, pt is aware.  

## 2018-04-28 NOTE — Progress Notes (Signed)
PT is aware.  Misty Stanley, please schedule the appt.

## 2018-04-28 NOTE — Telephone Encounter (Signed)
[phone] PATIENT RECEIVED CALL YESTERDAY, PLEASE CALL BACK WHEN YOU CAN

## 2018-04-29 ENCOUNTER — Encounter: Payer: Self-pay | Admitting: Family Medicine

## 2018-04-29 NOTE — Progress Notes (Signed)
Kara Stewart came in today to start her OB care. She did not have an appointment, and she stated when she was in MFM they informed her she would have an appointment today at 8:35. However, there was no appointment scheduled. She stated they spoke to someone in the office, and gave her a card with the appointment date and time. I was able to get her scheduled on Tuesday of next week.

## 2018-05-03 ENCOUNTER — Ambulatory Visit (HOSPITAL_COMMUNITY): Payer: Medicaid Other

## 2018-05-03 ENCOUNTER — Encounter: Payer: Self-pay | Admitting: Family Medicine

## 2018-05-03 ENCOUNTER — Ambulatory Visit (INDEPENDENT_AMBULATORY_CARE_PROVIDER_SITE_OTHER): Payer: Medicaid Other | Admitting: Family Medicine

## 2018-05-03 VITALS — BP 117/70 | HR 96 | Wt 174.2 lb

## 2018-05-03 DIAGNOSIS — L299 Pruritus, unspecified: Secondary | ICD-10-CM | POA: Diagnosis not present

## 2018-05-03 DIAGNOSIS — O0993 Supervision of high risk pregnancy, unspecified, third trimester: Secondary | ICD-10-CM

## 2018-05-03 DIAGNOSIS — Z3A32 32 weeks gestation of pregnancy: Secondary | ICD-10-CM | POA: Diagnosis not present

## 2018-05-03 DIAGNOSIS — O99713 Diseases of the skin and subcutaneous tissue complicating pregnancy, third trimester: Secondary | ICD-10-CM | POA: Diagnosis not present

## 2018-05-03 DIAGNOSIS — O0990 Supervision of high risk pregnancy, unspecified, unspecified trimester: Secondary | ICD-10-CM | POA: Insufficient documentation

## 2018-05-03 NOTE — BH Specialist Note (Deleted)
Integrated Behavioral Health Initial Visit  MRN: [id number] Name: Kara Stewart  Number of Integrated Behavioral Health Clinician visits:: {IBH Number of Visits:21014052} Session Start time: ***  Session End time: *** Total time: {IBH Total Time:21014050}  Type of Service: Integrated Behavioral Health- Individual/Family Interpretor:{yes [id number]} Interpretor Name and Language: ***   Warm Hand Off Completed.       SUBJECTIVE: Kara Stewart is a 43 y.o. female accompanied by {CHL AMB ACCOMPANIED UP:1031594585} Patient was referred by *** for ***. Patient reports the following symptoms/concerns: *** Duration of problem: ***; Severity of problem: {Mild/Moderate/Severe:20260}  OBJECTIVE: Mood: {BHH MOOD:22306} and Affect: {BHH AFFECT:22307} Risk of harm to self or others: {CHL AMB BH Suicide Current Mental Status:21022748}  LIFE CONTEXT: Family and Social: *** School/Work: *** Self-Care: *** Life Changes: ***  GOALS ADDRESSED: Patient will: 1. Reduce symptoms of: {IBH Symptoms:21014056} 2. Increase knowledge and/or ability of: {IBH Patient Tools:21014057}  3. Demonstrate ability to: {IBH Goals:21014053}  INTERVENTIONS: Interventions utilized: {IBH Interventions:21014054}  Standardized Assessments completed: {IBH Screening Tools:21014051}  ASSESSMENT: Patient currently experiencing ***.   Patient may benefit from ***.  PLAN: 1. Follow up with behavioral health clinician on : *** 2. Behavioral recommendations: *** 3. Referral(s): {IBH Referrals:21014055} 4. "From scale of 1-10, how likely are you to follow plan?": ***  Rae Lips, LCSW  Depression screen [facility] 2/9 05/03/2018 12/08/2017 10/14/2017 01/27/2017  Decreased Interest 2 2 2  0  Down, Depressed, Hopeless 2 1 1 1   PHQ - 2 Score 4 3 3 1   Altered sleeping 3 3 1  -  Tired, decreased energy 3 3 3  -  Change in appetite 2 2 3  -  Feeling bad or failure about yourself  0 0 0 -  Trouble concentrating 1 1 2  -   Moving slowly or fidgety/restless 1 2 0 -  Suicidal thoughts 0 0 0 -  PHQ-9 Score 14 14 12  -  Difficult doing work/chores - Very difficult Extremely dIfficult -   GAD 7 : Generalized Anxiety Score 05/03/2018  Nervous, Anxious, on Edge 2  Control/stop worrying 2  Worry too much - different things 2  Trouble relaxing 2  Restless 0  Easily annoyed or irritable 3

## 2018-05-03 NOTE — Progress Notes (Signed)
Subjective:   Kara Stewart is a 43 y.o. G3P0020 at [redacted]w[redacted]d by LMP, early ultrasound being seen today for her first obstetrical visit.  Her obstetrical history is significant for advanced maternal age and hx of alcohol abuse, alcoholic hepatitis, primary biliary cirrhosis, cHTN, THC use. Patient does not intend to breast feed. Pregnancy history fully reviewed.  Patient reports fatigue and abdominal pain.  Also feels generally uncomfortable, full, can't eat much, especially if consuming liquids and sore all over.  Excited to be pregnant and wants to do everything she can to have a healthy pregnancy.    HISTORY: OB History  Gravida Para Term Preterm AB Living  3 0 0 0 2 0  SAB TAB Ectopic Multiple Live Births  2 0 0 0 0    # Outcome Date GA Lbr Len/2nd Weight Sex Delivery Anes PTL Lv  3 Current           2 SAB           1 SAB            Last pap smear was in July 2019 and was normal Past Medical History:  Diagnosis Date  . Acid reflux   . Alcohol abuse 07/21/2012  . 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 Anxiety   . Asthma   . BV (bacterial vaginosis) 04/26/2015  . Colitis   . Dyspepsia 2004   Dx w/ PUD but no EGD, [place], [place]   . Hiatal hernia   . History of alcohol abuse 04/26/2015  . History of colitis 11/29/2014  . Hypertension   . Hypertriglyceridemia   . Hypothyroid   . Iritis    frequent  . Irregular periods 01/11/2014  . Ovarian cyst   . Panic attacks   . RLQ abdominal pain 01/11/2014  . SVT (supraventricular tachycardia) (HCC)   . Thyroid nodule   . Trichomonal vaginitis 01/11/2014  . Vaginal discharge 01/11/2014  . Weight gain 01/11/2014   Past Surgical History:  Procedure Laterality Date  . BIOPSY N/A 02/05/2015   Procedure: BIOPSY;  Surgeon: West Bali, MD;  Location: [facility];  Service: Endoscopy;  Laterality: N/A;  . COLONOSCOPY    . COLONOSCOPY WITH PROPOFOL N/A 02/05/2015   C[facility] HH/mild diverticulosis  . ESOPHAGEAL DILATION N/A 11/21/2015   Procedure:  ESOPHAGEAL DILATION;  Surgeon: Corbin Ade, MD;  Location: [facility];  Service: Endoscopy;  Laterality: N/A;  . ESOPHAGOGASTRODUODENOSCOPY  11/06/2011   [name]: MILD Esophagitis/PATENT ESOPHAGEAL Stricture/  Moderate gastritis. Bx no.hpylori or celiac, +gastritis  . ESOPHAGOGASTRODUODENOSCOPY (EGD) WITH PROPOFOL N/A 03/20/2014   [name]: 1. Mild esophagitis & distal esophagela stricture. 2. small hiatal hernia 3. moderate non-erosive gastritis and mild duodenits  . ESOPHAGOGASTRODUODENOSCOPY (EGD) WITH PROPOFOL N/A 11/21/2015   Dr. Jena Gauss: LA grade B esophagitis, MW tear likely source of hematemesis  . SAVORY DILATION N/A 03/20/2014   Procedure: SAVORY DILATION;  Surgeon: West Bali, MD;  Location: [facility];  Service: Endoscopy;  Laterality: N/A;  dilated with # 12.8, 14,15,16  . TOOTH EXTRACTION     Family History  Problem Relation Age of Onset  . Stomach cancer Paternal Grandfather        colon cancer  . Cancer Paternal Grandfather        throat and esophagus  . Breast cancer Maternal Grandmother   . Cancer Maternal Grandmother        skin  . Anxiety disorder Maternal Grandmother   . Hypertension Mother   .  Other Mother        fatty liver  . Hyperlipidemia Mother   . Liver disease Mother        fatty liver, does not drink.   . Other Father        varicose veins; stomach issues; hernia  . Hypertension Father   . Hyperlipidemia Father   . Cancer Father        prostate  . Arthritis Father        rheumatoid  . Thyroid disease Sister   . Other Brother        hernia  . Diabetes Maternal Grandfather   . Heart disease Maternal Grandfather   . Other Paternal Grandmother        hernia  . COPD Paternal Grandmother   . Diabetes Paternal Grandmother    Social History   Tobacco Use  . Smoking status: Current Some Day Smoker    Types: Cigarettes    Last attempt to quit: 12/22/2017    Years since quitting: 0.3  . Smokeless tobacco: Never Used  . Tobacco comment: "might smoke one  on a bad day"  Substance Use Topics  . Alcohol use: No    Alcohol/week: 0.0 standard drinks    Comment: h/o etoh abuse, quit early 2017 but started back summer 2017.; denied 11/16/17  . Drug use: No   Allergies  Allergen Reactions  . Penicillins Anaphylaxis    Has patient had a PCN reaction causing immediate rash, facial/tongue/throat swelling, SOB or lightheadedness with hypotension: yes Has patient had a PCN reaction causing severe rash involving mucus membranes or skin necrosis: No Has patient had a PCN reaction that required hospitalization Yes Has patient had a PCN reaction occurring within the last 10 years: No If all of the above answers are "NO", then may proceed with Cephalosporin use.   . Lidocaine Other (See Comments)    hallucinations   Current Outpatient Medications on File Prior to Visit  Medication Sig Dispense Refill  . calcium-vitamin D (OSCAL WITH D) 500-200 MG-UNIT tablet Take 1 tablet by mouth.    Marland Kitchen. albuterol (PROVENTIL HFA;VENTOLIN HFA) 108 (90 Base) MCG/ACT inhaler Inhale 2 puffs into the lungs every 6 (six) hours as needed for wheezing or shortness of breath. 1 Inhaler 0  . Prenatal Vit-Fe Fumarate-FA (PRENATAL VITAMIN PO) Take 1 tablet by mouth daily.     . ursodiol (ACTIGALL) 500 MG tablet Take 1 tablet (500 mg total) by mouth 2 (two) times daily. 60 tablet 5   No current facility-administered medications on file prior to visit.      Exam   Vitals:   05/03/18 1408  BP: 117/70  Pulse: 96  Weight: 174 lb 3.2 oz (79 kg)   Fetal Status: Fetal Heart Rate (bpm): 169 Fundal Height: 33 cm Movement: Present     General:  Alert, oriented and cooperative. Patient is in no acute distress.  Skin: Skin is warm and dry. No rash noted.   Cardiovascular: Normal heart rate noted  Respiratory: Normal respiratory effort, no problems with respiration noted  Abdomen: Soft, gravid, appropriate for gestational age.  Pain/Pressure: Present     Pelvic: Cervical exam  deferred        Extremities: Normal range of motion.  Edema: Trace  Mental Status: Normal mood and affect. Normal behavior. Normal judgment and thought content.   Assessment:   Pregnancy: [id number] Patient Active Problem List   Diagnosis Date Noted  . Supervision of high risk pregnancy, antepartum 05/03/2018  .  Chronic liver disease 11/16/2017  . Mallory-Weiss tear   . Hematemesis without nausea   . Esophageal dysphagia   . Alcoholic hepatitis without ascites   . Marijuana abuse 01/20/2015  . Abnormal CT scan, colon 01/08/2015  . Ovarian cyst 11/29/2014  . Anxiety 11/29/2014  . History of colitis 11/29/2014  . RUQ pain 08/27/2014  . Abdominal pain 08/07/2014  . Dysphagia, idiopathic 03/05/2014  . Thyroid nodule 01/11/2014  . Irregular periods 01/11/2014  . SVT (supraventricular tachycardia) (HCC) 09/07/2012  . EBV infection 08/11/2012  . Orthostatic hypotension 08/08/2012  . GERD (gastroesophageal reflux disease) 07/21/2012  . Alcohol abuse 07/21/2012  . Elevated LFTs- negative HIDA 10/28/2011  . Fatty liver 10/28/2011     Plan:  1. Supervision of high risk pregnancy, antepartum Continue prenatal vitamins. Genetic Screening discussed, results normal from previous practice: results reviewed. Ultrasound discussed; fetal anatomic survey: results reviewed. Problem list reviewed and updated. [facility] with multiple MDs and other Advanced Practice Providers was explained to patient; also emphasized that residents, students are part of our team. Routine obstetric precautions reviewed.  2. Pruritus of pregnancy in third trimester - Bile acids, total   Return in about 2 weeks (around 05/17/2018) for HROB.   Future Appointments  Date Time Provider Department Center  05/17/2018  2:35 PM Gwenevere AbbotPhillip, Mikaiah Stoffer, MD [facility] WOC  05/17/2018  [facility]  05/19/2018  2:00 PM [facility] US 3 [facility]-MFCUS  MFC-US  05/26/2018  2:15 PM [facility]-M[facility] US 4 WH-MFCUS MFC-US  06/01/2018 11:30 AM Tiffany KocherLewis, Leslie S, PA-C [facility]-RGA RGA  06/02/2018  2:00 PM [facility]-MFC US 3 WH-MFCUS MFC-US

## 2018-05-04 ENCOUNTER — Telehealth: Payer: Self-pay

## 2018-05-04 LAB — BILE ACIDS, TOTAL: BILE ACIDS TOTAL: 4.6 umol/L (ref 0.0–10.0)

## 2018-05-04 NOTE — Telephone Encounter (Signed)
LM for pt that we request that she comes in to sign paperwork.  If she could give [place] a call back or respond to [employer]. MyChart message sent.

## 2018-05-12 ENCOUNTER — Telehealth: Payer: Self-pay | Admitting: Obstetrics and Gynecology

## 2018-05-12 NOTE — Telephone Encounter (Signed)
Called the patient back per the message via our medical call center. The patient called in for information on upcoming appointment. Left a message stating to call our office. Also sending a reminder letter.

## 2018-05-17 ENCOUNTER — Other Ambulatory Visit: Payer: Self-pay

## 2018-05-17 ENCOUNTER — Ambulatory Visit: Payer: Self-pay | Admitting: Clinical

## 2018-05-17 ENCOUNTER — Ambulatory Visit (INDEPENDENT_AMBULATORY_CARE_PROVIDER_SITE_OTHER): Payer: Medicaid Other | Admitting: Family Medicine

## 2018-05-17 VITALS — BP 123/70 | HR 93 | Wt 178.4 lb

## 2018-05-17 DIAGNOSIS — K760 Fatty (change of) liver, not elsewhere classified: Secondary | ICD-10-CM | POA: Diagnosis not present

## 2018-05-17 DIAGNOSIS — O26613 Liver and biliary tract disorders in pregnancy, third trimester: Secondary | ICD-10-CM | POA: Diagnosis not present

## 2018-05-17 DIAGNOSIS — O0993 Supervision of high risk pregnancy, unspecified, third trimester: Secondary | ICD-10-CM | POA: Diagnosis present

## 2018-05-17 DIAGNOSIS — Z808 Family history of malignant neoplasm of other organs or systems: Secondary | ICD-10-CM

## 2018-05-17 DIAGNOSIS — F1210 Cannabis abuse, uncomplicated: Secondary | ICD-10-CM | POA: Diagnosis not present

## 2018-05-17 DIAGNOSIS — O99323 Drug use complicating pregnancy, third trimester: Secondary | ICD-10-CM | POA: Diagnosis not present

## 2018-05-17 DIAGNOSIS — O99313 Alcohol use complicating pregnancy, third trimester: Secondary | ICD-10-CM | POA: Diagnosis not present

## 2018-05-17 DIAGNOSIS — O0990 Supervision of high risk pregnancy, unspecified, unspecified trimester: Secondary | ICD-10-CM

## 2018-05-17 DIAGNOSIS — Z3A34 34 weeks gestation of pregnancy: Secondary | ICD-10-CM | POA: Diagnosis not present

## 2018-05-17 NOTE — Progress Notes (Signed)
Pt is concerned of spots on right upper arm. Baby has not moved much today. Pt states cough has returned, feels like she has a lot of fluid in chest.

## 2018-05-17 NOTE — Progress Notes (Signed)
   PRENATAL VISIT NOTE  Subjective:  Kara Stewart is a 43 y.o. G3P0020 at [redacted]w[redacted]d being seen today for ongoing prenatal care.  She is currently monitored for the following issues for this high-risk pregnancy and has Elevated LFTs- negative HIDA; Fatty liver; GERD (gastroesophageal reflux disease); Alcohol abuse; Orthostatic hypotension; EBV infection; SVT (supraventricular tachycardia) (HCC); Thyroid nodule; Irregular periods; Dysphagia, idiopathic; Abdominal pain; RUQ pain; Ovarian cyst; Anxiety; History of colitis; Abnormal CT scan, colon; Marijuana abuse; Hematemesis without nausea; Esophageal dysphagia; Alcoholic hepatitis without ascites; Mallory-Weiss tear; Chronic liver disease; and Supervision of high risk pregnancy, antepartum on their problem list.  Patient reports that she's still having lots of symptoms related to her pregnancy. Feeling tired, sore, back pain, difficulty sleeping, frequent urination and difficulty getting comfortable. Contractions: Irritability. Vag. Bleeding: None.  Movement: (!) Decreased. Denies leaking of fluid.   Has cough that she thinks might be bronchitis or chest cold. Not having any fevers or other symptoms of infection.   Also has spots on right arm. Concerned that it might be skin cancer because multiple people in family have had skin cancer. Maternal uncles died from skin cancer.   The following portions of the patient's history were reviewed and updated as appropriate: allergies, current medications, past family history, past medical history, past social history, past surgical history and problem list. Problem list updated.  Objective:   Vitals:   05/17/18 1515  BP: 123/70  Pulse: 93  Weight: 178 lb 6.4 oz (80.9 kg)    Fetal Status: Fetal Heart Rate (bpm): 149   Movement: (!) Decreased     General:  Alert, oriented and cooperative. Patient is in no acute distress.  Skin: Skin is warm and dry. No rash noted.   Cardiovascular: Normal heart rate  noted  Respiratory: Normal respiratory effort, no problems with respiration noted  Abdomen: Soft, gravid, appropriate for gestational age.  Pain/Pressure: Present     Pelvic: Cervical exam deferred        Extremities: Normal range of motion.  Edema: Trace  Mental Status: Normal mood and affect. Normal behavior. Normal judgment and thought content.   Assessment and Plan:  Pregnancy: G3P0020 at [redacted]w[redacted]d  1. Supervision of high risk pregnancy, antepartum - doing well - will start weekly BPP this week - follow up here in 2 weeks  2. Family history of skin cancer - Ambulatory referral to Dermatology   Preterm labor symptoms and general obstetric precautions including but not limited to vaginal bleeding, contractions, leaking of fluid and fetal movement were reviewed in detail with the patient. Please refer to After Visit Summary for other counseling recommendations.  Return in about 2 weeks (around 05/31/2018) for HROB.  Future Appointments  Date Time Provider Department Center  05/19/2018  2:00 PM [facility] [place] 3 WH-M[facility] MFC-US  05/26/2018  2:15 PM [facility] [place] 4 WH-MFCUS MFC-US  06/01/2018 11:30 AM Tiffany Kocher, PA-C [facility]RGA RGA  06/02/2018  2:00 PM [facility] [place] 3 WH-M[facility] MFC-US    Gwenevere Abbot, MD

## 2018-05-17 NOTE — BH Specialist Note (Signed)
Error

## 2018-05-19 ENCOUNTER — Ambulatory Visit (HOSPITAL_COMMUNITY): Admission: RE | Admit: 2018-05-19 | Payer: Medicaid Other | Source: Ambulatory Visit

## 2018-05-19 ENCOUNTER — Encounter (HOSPITAL_COMMUNITY): Payer: Self-pay

## 2018-05-19 ENCOUNTER — Encounter: Payer: Self-pay | Admitting: *Deleted

## 2018-05-19 ENCOUNTER — Ambulatory Visit (HOSPITAL_COMMUNITY)
Admission: RE | Admit: 2018-05-19 | Discharge: 2018-05-19 | Disposition: A | Discharge status: Home / Self Care | Payer: Medicaid Other | Source: Ambulatory Visit | Attending: Obstetrics and Gynecology | Admitting: Obstetrics and Gynecology

## 2018-05-19 DIAGNOSIS — O09513 Supervision of elderly primigravida, third trimester: Secondary | ICD-10-CM | POA: Diagnosis not present

## 2018-05-19 DIAGNOSIS — O0990 Supervision of high risk pregnancy, unspecified, unspecified trimester: Secondary | ICD-10-CM | POA: Insufficient documentation

## 2018-05-19 DIAGNOSIS — O10919 Unspecified pre-existing hypertension complicating pregnancy, unspecified trimester: Secondary | ICD-10-CM | POA: Diagnosis not present

## 2018-05-19 DIAGNOSIS — O99313 Alcohol use complicating pregnancy, third trimester: Secondary | ICD-10-CM | POA: Diagnosis not present

## 2018-05-19 DIAGNOSIS — O99323 Drug use complicating pregnancy, third trimester: Secondary | ICD-10-CM

## 2018-05-19 DIAGNOSIS — Z3A35 35 weeks gestation of pregnancy: Secondary | ICD-10-CM | POA: Diagnosis not present

## 2018-05-26 ENCOUNTER — Ambulatory Visit (HOSPITAL_COMMUNITY)
Admission: RE | Admit: 2018-05-26 | Discharge: 2018-05-26 | Disposition: A | Discharge status: Home / Self Care | Payer: Medicaid Other | Source: Ambulatory Visit | Attending: Family Medicine | Admitting: Family Medicine

## 2018-05-26 ENCOUNTER — Encounter (HOSPITAL_COMMUNITY): Payer: Self-pay

## 2018-05-26 DIAGNOSIS — Z3A36 36 weeks gestation of pregnancy: Secondary | ICD-10-CM

## 2018-05-26 DIAGNOSIS — O0990 Supervision of high risk pregnancy, unspecified, unspecified trimester: Secondary | ICD-10-CM | POA: Diagnosis not present

## 2018-05-26 DIAGNOSIS — F1099 Alcohol use, unspecified with unspecified alcohol-induced disorder: Secondary | ICD-10-CM

## 2018-05-26 DIAGNOSIS — O09513 Supervision of elderly primigravida, third trimester: Secondary | ICD-10-CM

## 2018-05-26 DIAGNOSIS — O10919 Unspecified pre-existing hypertension complicating pregnancy, unspecified trimester: Secondary | ICD-10-CM | POA: Diagnosis not present

## 2018-05-26 DIAGNOSIS — F1910 Other psychoactive substance abuse, uncomplicated: Secondary | ICD-10-CM | POA: Diagnosis not present

## 2018-05-26 DIAGNOSIS — O99313 Alcohol use complicating pregnancy, third trimester: Secondary | ICD-10-CM

## 2018-05-26 DIAGNOSIS — O99323 Drug use complicating pregnancy, third trimester: Secondary | ICD-10-CM | POA: Diagnosis not present

## 2018-06-01 ENCOUNTER — Ambulatory Visit (INDEPENDENT_AMBULATORY_CARE_PROVIDER_SITE_OTHER): Payer: Medicaid Other | Admitting: Gastroenterology

## 2018-06-01 ENCOUNTER — Encounter: Payer: Self-pay | Admitting: Gastroenterology

## 2018-06-01 VITALS — BP 127/75 | HR 78 | Temp 96.5°F | Ht 65.0 in | Wt 180.4 lb

## 2018-06-01 DIAGNOSIS — K769 Liver disease, unspecified: Secondary | ICD-10-CM

## 2018-06-01 NOTE — Progress Notes (Signed)
Primary Care Physician: Aliene Beams, MD  Primary Gastroenterologist:  Jonette Eva, MD   Chief Complaint  Patient presents with  . Hepatic Disease    HPI: Kara Stewart is a 43 y.o. female here for follow-up visit.  Last seen in December.  History of chronic liver disease, alcoholic hepatitis, elevated AMA empirically treated with Urso.  Known severe hepatic steatosis with hepatosplenomegaly with likely chronic liver disease complicated by ascites requiring abdominal paracenteses back in July 2018.  No alcohol since March 2018.  She is followed by fetal maternal specialist due to high risk pregnancy.  Patient is now [redacted]w[redacted]d station of pregnancy.  Labs from December 2019 via care everywhere show total bilirubin 0.5, alkaline phosphatase 84, AST 16, ALT 9, albumin 3.4, white blood cell count 9700, hemoglobin 12.1, hematocrit 35, platelets 130,000 stable.  Overall she is stable. She continues to have itching. No abdominal pain. BMs ok. No melena, brbpr. No n/v. She is excited but nervous about upcoming delivery. She has some mild edema of lower extremities towards end of day especially on days she works.   Current Outpatient Medications  Medication Sig Dispense Refill  . albuterol (PROVENTIL HFA;VENTOLIN HFA) 108 (90 Base) MCG/ACT inhaler Inhale 2 puffs into the lungs every 6 (six) hours as needed for wheezing or shortness of breath. 1 Inhaler 0  . calcium-vitamin D (OSCAL WITH D) 500-200 MG-UNIT tablet Take 1 tablet by mouth.    . Prenatal Vit-Fe Fumarate-FA (PRENATAL VITAMIN PO) Take 1 tablet by mouth daily.     . ursodiol (ACTIGALL) 500 MG tablet Take 1 tablet (500 mg total) by mouth 2 (two) times daily. 60 tablet 5   No current facility-administered medications for this visit.     Allergies as of 06/01/2018 - Review Complete 06/01/2018  Allergen Reaction Noted  . Penicillins Anaphylaxis 10/18/2011  . Lidocaine Other (See Comments) 06/22/2014    ROS:  General: Negative  for anorexia, weight loss, fever, chills, fatigue, weakness. ENT: Negative for hoarseness, difficulty swallowing , nasal congestion. CV: Negative for chest pain, angina, palpitations, dyspnea on exertion, peripheral edema.  Respiratory: Negative for dyspnea at rest, dyspnea on exertion, cough, sputum, wheezing.  GI: See history of present illness. GU:  Negative for dysuria, hematuria, urinary incontinence, urinary frequency, nocturnal urination.  Endo: Negative for unusual weight change.    Physical Examination:   BP 127/75   Pulse 78   Temp (!) 96.5 F (35.8 C) (Oral)   Ht  (1.651 m)   Wt 180 lb 6.4 oz (81.8 kg)   LMP 09/16/2017 (Approximate)   BMI 30.02 kg/m   General: Well-nourished, well-developed in no acute distress.  Eyes: No icterus. Mouth: Oropharyngeal mucosa moist and pink , no lesions erythema or exudate. Lungs: Clear to auscultation bilaterally.  Heart: Regular rate and rhythm, no murmurs rubs or gallops.  Abdomen: Bowel sounds are normal, nontender,  no hepatosplenomegaly or masses, no abdominal bruits or hernia , no rebound or guarding. Distended from pregnancy.  Extremities: No lower extremity edema. No clubbing or deformities. Neuro: Alert and oriented x 4   Skin: Warm and dry, no jaundice.   Psych: Alert and cooperative, normal mood and affect.  Imaging Studies: [place] Mfm Fetal Bpp Wo Non Stress  Result Date: 05/26/2018 ----------------------------------------------------------------------  OBSTETRICS REPORT                       (Signed Final 05/26/2018 03:24 pm) ---------------------------------------------------------------------- Patient Info  ID #:  [id number]                          D.O.B.:  [birth year] (42 yrs)  Name:       Kara Stewart                  Visit Date: 05/26/2018 02:33 pm ---------------------------------------------------------------------- Performed By  Performed By:     Hurman HornAlyssa Keatts          Ref. Address:     [address]                     [address]                                                             [address]                                                             [place], [place]                                                             [id number]  Attending:        Noralee Spaceavi Shankar MD        [facility]  Referred By:      Reva BoresANYA S PRATT                    MD ---------------------------------------------------------------------- Orders   #  Description                          Code         Ordered By   1  US MFM FETAL BPP WO NON              [id number]     [facility]      STRESS  ----------------------------------------------------------------------   #  Order #                    Accession #                 Episode #   1  [id number]                  [id number]  ---------------------------------------------------------------------- Indications   [redacted] weeks gestation of pregnancy                Z3A.36   Substance abuse affecting pregnancy,           O99.320 F19.10   antepartum (history alcohol, tobacco use)   Maternal alcohol use complicating              O99.310 F10.99  pregnancy, antepartum (cirrhosis)   h/o hypthyroidism, CHTN   Advanced maternal age primigravida 67+,        O57.513   third trimester  ---------------------------------------------------------------------- Fetal Evaluation  Num Of Fetuses:         1  Cardiac Activity:       Observed  Presentation:           Cephalic  Placenta:               Posterior  P. Cord Insertion:      Visualized, central  Amniotic Fluid  AFI FV:      Within normal limits  AFI Sum(cm)     %Tile       Largest Pocket(cm)  21.57           82          7.12  RUQ(cm)       RLQ(cm)       LUQ(cm)        LLQ(cm)  7.12          4.89          3.75           5.81 ---------------------------------------------------------------------- Biophysical Evaluation  Amniotic F.V:   Within normal limits       F. Tone:        Observed  F. Movement:    Observed                   Score:           8/8  F. Breathing:   Observed ---------------------------------------------------------------------- OB History  Gravidity:    3         Term:   0        Prem:   0        SAB:   2  TOP:          0       Ectopic:  0        Living: 0 ---------------------------------------------------------------------- Gestational Age  LMP:           36w 0d        Date:  09/16/17                 EDD:   06/23/18  Best:          Stevie Kern 0d     Det. By:  LMP  (09/16/17)          EDD:   06/23/18 ---------------------------------------------------------------------- Impression  Amniotic fluid is normal and good fetal activity is seen.  Antenatal testing is reassuring. BPP 8/8. ---------------------------------------------------------------------- Recommendations  Continue weekly antenatal testing till delivery. ----------------------------------------------------------------------                  Noralee Space, MD Electronically Signed Final Report   05/26/2018 03:24 pm ----------------------------------------------------------------------  [place] Mfm Fetal Bpp Wo Non Stress  Result Date: 05/19/2018 ----------------------------------------------------------------------  OBSTETRICS REPORT                       (Signed Final 05/19/2018 04:29 pm) ---------------------------------------------------------------------- Patient Info  ID #:       [id number]                          D.O.B.:  1975-10-18 (42 yrs)  Name:       Kara Stewart  Visit Date: 05/19/2018 03:49 pm ---------------------------------------------------------------------- Performed By  Performed By:     Percell Boston          Ref. Address:     [address]                                                             [place], [place]                                                             [address]  Attending:        Noralee Space MD        Location:         [facility]   Referred By:      Reva Bores                    MD ---------------------------------------------------------------------- Orders   #  Description                          Code         Ordered By   1  [place] MFM FETAL BPP WO NON              76819.01     RAVI SHANKAR      STRESS   2  [place] MFM OB FOLLOW UP                  74944.96     RAVI [facility]  ----------------------------------------------------------------------   #  Order #                    Accession #                 Episode #   1  [id number]                  [id number]                  [id number]   2  [id number]                  [id number]                  [id number]  ---------------------------------------------------------------------- Indications   [redacted] weeks gestation of pregnancy                Z3A.35   Substance abuse affecting pregnancy,           O99.320 F19.10   antepartum (history alcohol, tobacco use)   Maternal alcohol use complicating  O99.310 F10.99   pregnancy, antepartum (cirrhosis)   h/o hypthyroidism, CHTN   Advanced maternal age primigravida 78+,        O65.513   third trimester  ---------------------------------------------------------------------- Fetal Evaluation  Num Of Fetuses:         1  Cardiac Activity:       Observed  Presentation:           Cephalic  Placenta:               Posterior  P. Cord Insertion:      Visualized, central  Amniotic Fluid  AFI FV:      Within normal limits  AFI Sum(cm)     %Tile       Largest Pocket(cm)  17.76           66          5.69  RUQ(cm)       RLQ(cm)       LUQ(cm)        LLQ(cm)  5.69          4.24          4.06           3.77 ---------------------------------------------------------------------- Biophysical Evaluation  Amniotic F.V:   Within normal limits       F. Tone:        Observed  F. Movement:    Observed                   Score:          8/8  F. Breathing:   Observed ---------------------------------------------------------------------- Biometry  BPD:      85.5  mm     G. Age:  34w 3d          36  %    CI:        78.75   %    70 - 86                                                          FL/HC:      21.8   %    20.1 - 22.3  HC:      304.7  mm     G. Age:  33w 6d          5  %    HC/AC:      0.94        0.93 - 1.11  AC:      323.1  mm     G. Age:  36w 1d         86  %    FL/BPD:     77.8   %    71 - 87  FL:       66.5  mm     G. Age:  34w 2d         23  %    FL/AC:      20.6   %    20 - 24  Est. FW:    2635  gm    5 lb 13 oz      67  % ---------------------------------------------------------------------- OB History  Gravidity:    3         Term:   0  Prem:   0        SAB:   2  TOP:          0       Ectopic:  0        Living: 0 ---------------------------------------------------------------------- Gestational Age  LMP:           35w 0d        Date:  09/16/17                 EDD:   06/23/18  U/S Today:     34w 5d                                        EDD:   06/25/18  Best:          35w 0d     Det. By:  LMP  (09/16/17)          EDD:   06/23/18 ---------------------------------------------------------------------- Anatomy  Cranium:               Appears normal         LVOT:                   Previously seen  Cavum:                 Previously seen        Aortic Arch:            Previously seen  Ventricles:            Appears normal         Ductal Arch:            Previously seen  Choroid Plexus:        Previously seen        Diaphragm:              Previously seen  Cerebellum:            Previously seen        Stomach:                Appears normal, left                                                                        sided  Posterior Fossa:       Previously seen        Abdomen:                Previously seen  Nuchal Fold:           Previously seen        Abdominal Wall:         Previously seen  Face:                  Orbits and profile     Cord Vessels:           Previously seen                         previously seen  Lips:  Previously seen        Kidneys:                 Previously seen  Palate:                Previously seen        Bladder:                Appears normal  Thoracic:              Appears normal         Spine:                  Previously seen  Heart:                 Previously seen        Upper Extremities:      Previously seen  RVOT:                  Previously seen        Lower Extremities:      Previously seen  Other:  Nasal bone previously visualized. Open hands previously visualized.          5th digit previously visualized. Heels previously visualized. Fetus          appears to be female. ---------------------------------------------------------------------- Cervix Uterus Adnexa  Cervix  Not visualized (advanced GA >24wks) ---------------------------------------------------------------------- Impression  Patient transferred her prenatal care to [facility].  Amniotic fluid is normal and good fetal activity is seen.  Antenatal testing is reassuring. BPP 8/8. ---------------------------------------------------------------------- Recommendations  Continue weekly antenatal testing till delivery. ----------------------------------------------------------------------                  Noralee Space, MD Electronically Signed Final Report   05/19/2018 04:29 pm ----------------------------------------------------------------------  [place] Mfm Ob Follow Up  Result Date: 05/19/2018 ----------------------------------------------------------------------  OBSTETRICS REPORT                       (Signed Final 05/19/2018 04:29 pm) ---------------------------------------------------------------------- Patient Info  ID #:       [id number]                          D.O.B.:  [birth year] (42 yrs)  Name:       Kara Stewart                  Visit Date: 05/19/2018 03:49 pm ---------------------------------------------------------------------- Performed By  Performed By:     Percell Boston          Ref. Address:     [address]  [place], [place]                                                             [address]  Attending:        Noralee Space MD        Location:         [facility]  Referred By:      Reva Bores                    MD ---------------------------------------------------------------------- Orders   #  Description                          Code         Ordered By   1  [place] MFM FETAL BPP WO NON              76819.01     RAVI SHANKAR      STRESS   2  [place] MFM OB FOLLOW UP                  76816.01     RAVI [facility]  ----------------------------------------------------------------------   #  Order #                    Accession #                 Episode #   1  [id number]                  [id number]   2  [id number]                  [id number]                  [id number]  ---------------------------------------------------------------------- Indications   [redacted] weeks gestation of pregnancy                Z3A.35   Substance abuse affecting pregnancy,           O99.320 F19.10   antepartum (history alcohol, tobacco use)   Maternal alcohol use complicating              O99.310 F10.99   pregnancy, antepartum (cirrhosis)   h/o hypthyroidism, CHTN   Advanced maternal age primigravida 36+,        O27.513   third trimester  ---------------------------------------------------------------------- Fetal Evaluation  Num Of Fetuses:         1  Cardiac Activity:       Observed  Presentation:           Cephalic  Placenta:               Posterior  P. Cord Insertion:      Visualized, central  Amniotic Fluid  AFI FV:      Within normal limits  AFI Sum(cm)     %Tile       Largest Pocket(cm)  17.76           66          5.69  RUQ(cm)       RLQ(cm)  LUQ(cm)        LLQ(cm)  5.69          4.24          4.06           3.77 ---------------------------------------------------------------------- Biophysical Evaluation  Amniotic F.V:   Within normal  limits       F. Tone:        Observed  F. Movement:    Observed                   Score:          8/8  F. Breathing:   Observed ---------------------------------------------------------------------- Biometry  BPD:      85.5  mm     G. Age:  34w 3d         36  %    CI:        78.75   %    70 - 86                                                          FL/HC:      21.8   %    20.1 - 22.3  HC:      304.7  mm     G. Age:  33w 6d          5  %    HC/AC:      0.94        0.93 - 1.11  AC:      323.1  mm     G. Age:  36w 1d         86  %    FL/BPD:     77.8   %    71 - 87  FL:       66.5  mm     G. Age:  34w 2d         23  %    FL/AC:      20.6   %    20 - 24  Est. FW:    2635  gm    5 lb 13 oz      67  % ---------------------------------------------------------------------- OB History  Gravidity:    3         Term:   0        Prem:   0        SAB:   2  TOP:          0       Ectopic:  0        Living: 0 ---------------------------------------------------------------------- Gestational Age  LMP:           35w 0d        Date:  09/16/17                 EDD:   06/23/18  U/S Today:     34w 5d                                        EDD:   06/25/18  Best:          Consuello Closs 0d  Det. By:  LMP  (09/16/17)          EDD:   06/23/18 ---------------------------------------------------------------------- Anatomy  Cranium:               Appears normal         LVOT:                   Previously seen  Cavum:                 Previously seen        Aortic Arch:            Previously seen  Ventricles:            Appears normal         Ductal Arch:            Previously seen  Choroid Plexus:        Previously seen        Diaphragm:              Previously seen  Cerebellum:            Previously seen        Stomach:                Appears normal, left                                                                        sided  Posterior Fossa:       Previously seen        Abdomen:                Previously seen  Nuchal Fold:           Previously  seen        Abdominal Wall:         Previously seen  Face:                  Orbits and profile     Cord Vessels:           Previously seen                         previously seen  Lips:                  Previously seen        Kidneys:                Previously seen  Palate:                Previously seen        Bladder:                Appears normal  Thoracic:              Appears normal         Spine:                  Previously seen  Heart:                 Previously seen        Upper Extremities:  Previously seen  RVOT:                  Previously seen        Lower Extremities:      Previously seen  Other:  Nasal bone previously visualized. Open hands previously visualized.          5th digit previously visualized. Heels previously visualized. Fetus          appears to be female. ---------------------------------------------------------------------- Cervix Uterus Adnexa  Cervix  Not visualized (advanced GA >24wks) ---------------------------------------------------------------------- Impression  Patient transferred her prenatal care to [facility].  Amniotic fluid is normal and good fetal activity is seen.  Antenatal testing is reassuring. BPP 8/8. ---------------------------------------------------------------------- Recommendations  Continue weekly antenatal testing till delivery. ----------------------------------------------------------------------                  Noralee Space, MD Electronically Signed Final Report   05/19/2018 04:29 pm ----------------------------------------------------------------------

## 2018-06-01 NOTE — Progress Notes (Signed)
cc'ed to pcp °

## 2018-06-01 NOTE — Assessment & Plan Note (Addendum)
43 y/o female with h/o alcoholic hepatitis complicated by ascites requiring paracentesis 2018, positive AMA on empiric URSO. She is now [redacted]w[redacted]d gestation. She continues to have some intermittent diffuse itching without clear cause. No evidence of cholestasis on serial labs. She clinically has been very stable from her liver disease throughout the pregnancy. Encouraged her to maintain sobriety after deliver. She feels strong with no desires to go back to drinking. We will plan to see her back in 09/2018 and update her labs at that time. Consider updating abd u/s later this year. She will call in interim with any questions or concerns.

## 2018-06-01 NOTE — Patient Instructions (Signed)
1. We will see you back in June and update all of your labs then. Please call if you have any concerns or questions! 2. Good luck with your delivery!

## 2018-06-02 ENCOUNTER — Ambulatory Visit (HOSPITAL_COMMUNITY): Payer: Medicaid Other | Admitting: *Deleted

## 2018-06-02 ENCOUNTER — Encounter (HOSPITAL_COMMUNITY): Payer: Self-pay | Admitting: *Deleted

## 2018-06-02 ENCOUNTER — Ambulatory Visit (HOSPITAL_COMMUNITY)
Admission: RE | Admit: 2018-06-02 | Discharge: 2018-06-02 | Disposition: A | Discharge status: Home / Self Care | Payer: Medicaid Other | Source: Ambulatory Visit | Attending: Family Medicine | Admitting: Family Medicine

## 2018-06-02 DIAGNOSIS — O0990 Supervision of high risk pregnancy, unspecified, unspecified trimester: Secondary | ICD-10-CM

## 2018-06-02 DIAGNOSIS — O09513 Supervision of elderly primigravida, third trimester: Secondary | ICD-10-CM | POA: Diagnosis not present

## 2018-06-02 DIAGNOSIS — F1910 Other psychoactive substance abuse, uncomplicated: Secondary | ICD-10-CM

## 2018-06-02 DIAGNOSIS — Z3A37 37 weeks gestation of pregnancy: Secondary | ICD-10-CM

## 2018-06-02 DIAGNOSIS — O10919 Unspecified pre-existing hypertension complicating pregnancy, unspecified trimester: Secondary | ICD-10-CM | POA: Insufficient documentation

## 2018-06-02 DIAGNOSIS — O99313 Alcohol use complicating pregnancy, third trimester: Secondary | ICD-10-CM

## 2018-06-02 DIAGNOSIS — O99323 Drug use complicating pregnancy, third trimester: Secondary | ICD-10-CM | POA: Diagnosis not present

## 2018-06-03 ENCOUNTER — Other Ambulatory Visit (HOSPITAL_COMMUNITY): Payer: Self-pay | Admitting: *Deleted

## 2018-06-03 DIAGNOSIS — O09523 Supervision of elderly multigravida, third trimester: Secondary | ICD-10-CM

## 2018-06-06 ENCOUNTER — Encounter: Payer: Self-pay | Admitting: Obstetrics and Gynecology

## 2018-06-08 ENCOUNTER — Other Ambulatory Visit (HOSPITAL_COMMUNITY)
Admission: RE | Admit: 2018-06-08 | Discharge: 2018-06-08 | Disposition: A | Discharge status: Home / Self Care | Payer: Medicaid Other | Source: Ambulatory Visit | Attending: Obstetrics & Gynecology | Admitting: Obstetrics & Gynecology

## 2018-06-08 ENCOUNTER — Ambulatory Visit (INDEPENDENT_AMBULATORY_CARE_PROVIDER_SITE_OTHER): Payer: Medicaid Other | Admitting: Obstetrics & Gynecology

## 2018-06-08 VITALS — BP 121/78 | HR 88 | Wt 181.5 lb

## 2018-06-08 DIAGNOSIS — O0990 Supervision of high risk pregnancy, unspecified, unspecified trimester: Secondary | ICD-10-CM

## 2018-06-08 DIAGNOSIS — F1010 Alcohol abuse, uncomplicated: Secondary | ICD-10-CM

## 2018-06-08 DIAGNOSIS — O09523 Supervision of elderly multigravida, third trimester: Secondary | ICD-10-CM | POA: Diagnosis not present

## 2018-06-08 DIAGNOSIS — O09529 Supervision of elderly multigravida, unspecified trimester: Secondary | ICD-10-CM | POA: Insufficient documentation

## 2018-06-08 DIAGNOSIS — O99313 Alcohol use complicating pregnancy, third trimester: Secondary | ICD-10-CM | POA: Diagnosis not present

## 2018-06-08 DIAGNOSIS — Z3A37 37 weeks gestation of pregnancy: Secondary | ICD-10-CM

## 2018-06-08 NOTE — Progress Notes (Signed)
   PRENATAL VISIT NOTE  Subjective:  Kara Stewart is a 43 y.o. G3P0020 at [redacted]w[redacted]d being seen today for ongoing prenatal care.  She is currently monitored for the following issues for this high-risk pregnancy and has Elevated LFTs- negative HIDA; Fatty liver; GERD (gastroesophageal reflux disease); Alcohol abuse; Orthostatic hypotension; EBV infection; SVT (supraventricular tachycardia) (HCC); Thyroid nodule; Irregular periods; Dysphagia, idiopathic; Abdominal pain; RUQ pain; Ovarian cyst; Anxiety; History of colitis; Abnormal CT scan, colon; Marijuana abuse; Hematemesis without nausea; Esophageal dysphagia; Alcoholic hepatitis without ascites; Mallory-Weiss tear; Chronic liver disease; Supervision of high risk pregnancy, antepartum; and AMA (advanced maternal age) multigravida 35+ on their problem list.  Patient reports a cough for a month, no fevers that she knows of. She has tried cough drops and delsym..  Contractions: Irritability. Vag. Bleeding: None.  Movement: Present. Denies leaking of fluid.   The following portions of the patient's history were reviewed and updated as appropriate: allergies, current medications, past family history, past medical history, past social history, past surgical history and problem list. Problem list updated.  Objective:   Vitals:   06/08/18 1424  BP: 121/78  Pulse: 88  Weight: 181 lb 8 oz (82.3 kg)    Fetal Status: Fetal Heart Rate (bpm): 158   Movement: Present     General:  Alert, oriented and cooperative. Patient is in no acute distress.  Skin: Skin is warm and dry. No rash noted.   Cardiovascular: Normal heart rate noted  Respiratory: Normal respiratory effort, no problems with respiration noted  Abdomen: Soft, gravid, appropriate for gestational age.  Pain/Pressure: Present     Pelvic: Cervical exam performed        Extremities: Normal range of motion.  Edema: Trace  Mental Status: Normal mood and affect. Normal behavior. Normal judgment and  thought content.   Assessment and Plan:  Pregnancy: G3P0020 at [redacted]w[redacted]d  1. Supervision of high risk pregnancy, antepartum  - Culture, beta strep (group b only) - Cervicovaginal ancillary only( [place])  2. Alcohol abuse   3. Multigravida of advanced maternal age in third trimester - MFM rec's IOL at 39 weeks but at this point, she is not happy about this. - she is getting weekly BPP at MFM  Term labor symptoms and general obstetric precautions including but not limited to vaginal bleeding, contractions, leaking of fluid and fetal movement were reviewed in detail with the patient. Please refer to After Visit Summary for other counseling recommendations.  No follow-ups on file.  Future Appointments  Date Time Provider Department Center  06/09/2018 12:20 PM [facility] NURSE [facility]-US  06/09/2018 12:30 PM [facility] [place] 1 WH-MFCUS MFC-US  10/10/2018  9:30 AM Tiffany Kocher, PA-C [facility]    Allie Bossier, MD

## 2018-06-09 ENCOUNTER — Ambulatory Visit (HOSPITAL_COMMUNITY): Payer: Medicaid Other | Admitting: *Deleted

## 2018-06-09 ENCOUNTER — Encounter (HOSPITAL_COMMUNITY): Payer: Self-pay

## 2018-06-09 ENCOUNTER — Ambulatory Visit (HOSPITAL_COMMUNITY)
Admission: RE | Admit: 2018-06-09 | Discharge: 2018-06-09 | Disposition: A | Discharge status: Home / Self Care | Payer: Medicaid Other | Source: Ambulatory Visit | Attending: Obstetrics and Gynecology | Admitting: Obstetrics and Gynecology

## 2018-06-09 VITALS — BP 122/73 | HR 86 | Wt 184.2 lb

## 2018-06-09 DIAGNOSIS — Z362 Encounter for other antenatal screening follow-up: Secondary | ICD-10-CM | POA: Diagnosis not present

## 2018-06-09 DIAGNOSIS — O09513 Supervision of elderly primigravida, third trimester: Secondary | ICD-10-CM

## 2018-06-09 DIAGNOSIS — O99313 Alcohol use complicating pregnancy, third trimester: Secondary | ICD-10-CM

## 2018-06-09 DIAGNOSIS — F1910 Other psychoactive substance abuse, uncomplicated: Secondary | ICD-10-CM

## 2018-06-09 DIAGNOSIS — O09523 Supervision of elderly multigravida, third trimester: Secondary | ICD-10-CM | POA: Diagnosis not present

## 2018-06-09 DIAGNOSIS — O0990 Supervision of high risk pregnancy, unspecified, unspecified trimester: Secondary | ICD-10-CM | POA: Diagnosis not present

## 2018-06-09 DIAGNOSIS — O99323 Drug use complicating pregnancy, third trimester: Secondary | ICD-10-CM | POA: Diagnosis not present

## 2018-06-09 DIAGNOSIS — F1099 Alcohol use, unspecified with unspecified alcohol-induced disorder: Secondary | ICD-10-CM | POA: Diagnosis not present

## 2018-06-09 DIAGNOSIS — Z3A38 38 weeks gestation of pregnancy: Secondary | ICD-10-CM

## 2018-06-09 LAB — CERVICOVAGINAL ANCILLARY ONLY
Chlamydia: NEGATIVE
Neisseria Gonorrhea: NEGATIVE

## 2018-06-10 ENCOUNTER — Telehealth: Payer: Self-pay

## 2018-06-10 ENCOUNTER — Other Ambulatory Visit: Payer: Self-pay | Admitting: Advanced Practice Midwife

## 2018-06-10 ENCOUNTER — Other Ambulatory Visit: Payer: Self-pay | Admitting: Obstetrics & Gynecology

## 2018-06-10 DIAGNOSIS — O0990 Supervision of high risk pregnancy, unspecified, unspecified trimester: Secondary | ICD-10-CM

## 2018-06-10 NOTE — Progress Notes (Signed)
IOL orders placed 

## 2018-06-10 NOTE — Telephone Encounter (Signed)
Called pt to give information on induction scheduled on 3/12 @ 730am

## 2018-06-11 LAB — CULTURE, BETA STREP (GROUP B ONLY): STREP GP B CULTURE: POSITIVE — AB

## 2018-06-13 ENCOUNTER — Encounter: Payer: Self-pay | Admitting: Obstetrics and Gynecology

## 2018-06-13 ENCOUNTER — Encounter (HOSPITAL_COMMUNITY): Payer: Self-pay | Admitting: *Deleted

## 2018-06-13 ENCOUNTER — Telehealth (HOSPITAL_COMMUNITY): Payer: Self-pay | Admitting: *Deleted

## 2018-06-13 NOTE — Telephone Encounter (Signed)
Preadmission screen  

## 2018-06-15 ENCOUNTER — Other Ambulatory Visit (HOSPITAL_COMMUNITY): Payer: Self-pay | Admitting: *Deleted

## 2018-06-15 ENCOUNTER — Other Ambulatory Visit: Payer: Self-pay

## 2018-06-15 ENCOUNTER — Inpatient Hospital Stay (HOSPITAL_COMMUNITY): Payer: Medicaid Other | Admitting: Anesthesiology

## 2018-06-15 ENCOUNTER — Encounter (HOSPITAL_COMMUNITY): Payer: Self-pay

## 2018-06-15 ENCOUNTER — Inpatient Hospital Stay (HOSPITAL_COMMUNITY)
Admission: AD | Admit: 2018-06-15 | Discharge: 2018-06-17 | DRG: 806 | Disposition: A | Discharge status: Home / Self Care | Payer: Medicaid Other | Attending: Obstetrics and Gynecology | Admitting: Obstetrics and Gynecology

## 2018-06-15 ENCOUNTER — Encounter: Payer: Self-pay | Admitting: Obstetrics and Gynecology

## 2018-06-15 DIAGNOSIS — K769 Liver disease, unspecified: Secondary | ICD-10-CM | POA: Diagnosis present

## 2018-06-15 DIAGNOSIS — F419 Anxiety disorder, unspecified: Secondary | ICD-10-CM | POA: Diagnosis present

## 2018-06-15 DIAGNOSIS — F410 Panic disorder [episodic paroxysmal anxiety] without agoraphobia: Secondary | ICD-10-CM | POA: Diagnosis present

## 2018-06-15 DIAGNOSIS — O0990 Supervision of high risk pregnancy, unspecified, unspecified trimester: Secondary | ICD-10-CM

## 2018-06-15 DIAGNOSIS — O10919 Unspecified pre-existing hypertension complicating pregnancy, unspecified trimester: Secondary | ICD-10-CM | POA: Diagnosis present

## 2018-06-15 DIAGNOSIS — O99824 Streptococcus B carrier state complicating childbirth: Secondary | ICD-10-CM | POA: Diagnosis not present

## 2018-06-15 DIAGNOSIS — K760 Fatty (change of) liver, not elsewhere classified: Secondary | ICD-10-CM | POA: Diagnosis present

## 2018-06-15 DIAGNOSIS — K7010 Alcoholic hepatitis without ascites: Secondary | ICD-10-CM | POA: Diagnosis present

## 2018-06-15 DIAGNOSIS — O9842 Viral hepatitis complicating childbirth: Secondary | ICD-10-CM | POA: Diagnosis present

## 2018-06-15 DIAGNOSIS — K743 Primary biliary cirrhosis: Secondary | ICD-10-CM | POA: Diagnosis present

## 2018-06-15 DIAGNOSIS — O2662 Liver and biliary tract disorders in childbirth: Principal | ICD-10-CM | POA: Diagnosis present

## 2018-06-15 DIAGNOSIS — F1011 Alcohol abuse, in remission: Secondary | ICD-10-CM

## 2018-06-15 DIAGNOSIS — O09529 Supervision of elderly multigravida, unspecified trimester: Secondary | ICD-10-CM

## 2018-06-15 DIAGNOSIS — O1002 Pre-existing essential hypertension complicating childbirth: Secondary | ICD-10-CM | POA: Diagnosis present

## 2018-06-15 DIAGNOSIS — Z3A38 38 weeks gestation of pregnancy: Secondary | ICD-10-CM

## 2018-06-15 DIAGNOSIS — O09523 Supervision of elderly multigravida, third trimester: Secondary | ICD-10-CM

## 2018-06-15 DIAGNOSIS — F17210 Nicotine dependence, cigarettes, uncomplicated: Secondary | ICD-10-CM | POA: Diagnosis present

## 2018-06-15 DIAGNOSIS — O26893 Other specified pregnancy related conditions, third trimester: Secondary | ICD-10-CM | POA: Diagnosis present

## 2018-06-15 DIAGNOSIS — O99334 Smoking (tobacco) complicating childbirth: Secondary | ICD-10-CM | POA: Diagnosis present

## 2018-06-15 DIAGNOSIS — Z880 Allergy status to penicillin: Secondary | ICD-10-CM

## 2018-06-15 DIAGNOSIS — Z87898 Personal history of other specified conditions: Secondary | ICD-10-CM

## 2018-06-15 DIAGNOSIS — O9952 Diseases of the respiratory system complicating childbirth: Secondary | ICD-10-CM | POA: Diagnosis present

## 2018-06-15 DIAGNOSIS — K7011 Alcoholic hepatitis with ascites: Secondary | ICD-10-CM | POA: Diagnosis not present

## 2018-06-15 HISTORY — DX: Gastro-esophageal laceration-hemorrhage syndrome: K22.6

## 2018-06-15 HISTORY — DX: Primary biliary cirrhosis: K74.3

## 2018-06-15 HISTORY — DX: Infectious mononucleosis, unspecified without complication: B27.90

## 2018-06-15 HISTORY — DX: Hematemesis: K92.0

## 2018-06-15 HISTORY — DX: Alcoholic hepatitis without ascites: K70.10

## 2018-06-15 HISTORY — DX: Gastro-esophageal reflux disease without esophagitis: K21.9

## 2018-06-15 LAB — TYPE AND SCREEN
ABO/RH(D): A POS
Antibody Screen: NEGATIVE

## 2018-06-15 LAB — COMPREHENSIVE METABOLIC PANEL
ALT: 11 U/L (ref 0–44)
AST: 22 U/L (ref 15–41)
Albumin: 2.7 g/dL — ABNORMAL LOW (ref 3.5–5.0)
Alkaline Phosphatase: 163 U/L — ABNORMAL HIGH (ref 38–126)
Anion gap: 8 (ref 5–15)
BUN: 9 mg/dL (ref 6–20)
CO2: 22 mmol/L (ref 22–32)
Calcium: 9.3 mg/dL (ref 8.9–10.3)
Chloride: 107 mmol/L (ref 98–111)
Creatinine, Ser: 0.77 mg/dL (ref 0.44–1.00)
GFR calc non Af Amer: >60 mL/min (ref >60)
Glucose, Bld: 104 mg/dL — ABNORMAL HIGH (ref 70–99)
Potassium: 4 mmol/L (ref 3.5–5.1)
SODIUM: 137 mmol/L (ref 135–145)
Total Bilirubin: 0.5 mg/dL (ref 0.3–1.2)
Total Protein: 6.2 g/dL — ABNORMAL LOW (ref 6.5–8.1)

## 2018-06-15 LAB — CBC
HCT: 42.3 % (ref 36.0–46.0)
Hemoglobin: 14.8 g/dL (ref 12.0–15.0)
MCH: 30.5 pg (ref 26.0–34.0)
MCHC: 35 g/dL (ref 30.0–36.0)
MCV: 87.2 fL (ref 80.0–100.0)
PLATELETS: 187 10*3/uL (ref 150–400)
RBC: 4.85 MIL/uL (ref 3.87–5.11)
RDW: 13.8 % (ref 11.5–15.5)
WBC: 13.8 10*3/uL — ABNORMAL HIGH (ref 4.0–10.5)
nRBC: 0 % (ref 0.0–0.2)

## 2018-06-15 LAB — ABO/RH: ABO/RH(D): A POS

## 2018-06-15 LAB — POCT FERN TEST: POCT Fern Test: POSITIVE

## 2018-06-15 LAB — PROTIME-INR
INR: 1 (ref 0.8–1.2)
Prothrombin Time: 13 seconds (ref 11.4–15.2)

## 2018-06-15 LAB — RAPID URINE DRUG SCREEN, HOSP PERFORMED
Amphetamines: NOT DETECTED
Barbiturates: NOT DETECTED
Benzodiazepines: NOT DETECTED
Cocaine: NOT DETECTED
Opiates: NOT DETECTED
Tetrahydrocannabinol: POSITIVE — AB

## 2018-06-15 LAB — APTT: aPTT: 30 seconds (ref 24–36)

## 2018-06-15 LAB — RPR: RPR Ser Ql: NONREACTIVE

## 2018-06-15 MED ORDER — OXYTOCIN BOLUS FROM INFUSION
500.0000 mL | Freq: Once | INTRAVENOUS | Status: AC
  Administered 2018-06-15: 500 mL via INTRAVENOUS

## 2018-06-15 MED ORDER — ONDANSETRON HCL 4 MG PO TABS: 4.0000 mg | ORAL_TABLET | ORAL | Status: DC | PRN

## 2018-06-15 MED ORDER — LIDOCAINE HCL (PF) 1 % IJ SOLN
INTRAMUSCULAR | Status: DC | PRN
  Administered 2018-06-15: 8 mL via EPIDURAL
  Administered 2018-06-15: 5 mL via EPIDURAL

## 2018-06-15 MED ORDER — URSODIOL 500 MG PO TABS: 500.0000 mg | ORAL_TABLET | Freq: Two times a day (BID) | ORAL | Status: DC

## 2018-06-15 MED ORDER — ONDANSETRON HCL 4 MG/2ML IJ SOLN: 4.0000 mg | Freq: Four times a day (QID) | INTRAMUSCULAR | Status: DC | PRN

## 2018-06-15 MED ORDER — LACTATED RINGERS IV SOLN
500.0000 mL | Freq: Once | INTRAVENOUS | Status: AC
  Administered 2018-06-15: 500 mL via INTRAVENOUS

## 2018-06-15 MED ORDER — PRENATAL MULTIVITAMIN CH
1.0000 | ORAL_TABLET | Freq: Every day | ORAL | Status: DC
  Administered 2018-06-16 – 2018-06-17 (×2): 1 via ORAL
  Filled 2018-06-15 (×2): qty 1

## 2018-06-15 MED ORDER — VANCOMYCIN HCL IN DEXTROSE 1-5 GM/200ML-% IV SOLN
1000.0000 mg | Freq: Two times a day (BID) | INTRAVENOUS | Status: DC
  Administered 2018-06-15 (×2): 1000 mg via INTRAVENOUS
  Filled 2018-06-15 (×2): qty 200

## 2018-06-15 MED ORDER — OXYTOCIN 40 UNITS IN NORMAL SALINE INFUSION - SIMPLE MED
2.5000 [IU]/h | INTRAVENOUS | Status: DC
  Filled 2018-06-15: qty 1000

## 2018-06-15 MED ORDER — FENTANYL-BUPIVACAINE-NACL 0.5-0.125-0.9 MG/250ML-% EP SOLN
12.0000 mL/h | EPIDURAL | Status: DC | PRN
  Administered 2018-06-15: 12 mL/h via EPIDURAL
  Filled 2018-06-15: qty 250

## 2018-06-15 MED ORDER — TETANUS-DIPHTH-ACELL PERTUSSIS 5-2.5-18.5 LF-MCG/0.5 IM SUSP: 0.5000 mL | Freq: Once | INTRAMUSCULAR | Status: DC

## 2018-06-15 MED ORDER — LACTATED RINGERS IV SOLN
INTRAVENOUS | Status: DC
  Administered 2018-06-15 (×3): via INTRAVENOUS

## 2018-06-15 MED ORDER — TRANEXAMIC ACID-NACL 1000-0.7 MG/100ML-% IV SOLN
INTRAVENOUS | Status: AC
  Administered 2018-06-15: 1000 mg via INTRAVENOUS
  Filled 2018-06-15: qty 100

## 2018-06-15 MED ORDER — DIPHENHYDRAMINE HCL 50 MG/ML IJ SOLN: 12.5000 mg | INTRAMUSCULAR | Status: DC | PRN

## 2018-06-15 MED ORDER — SOD CITRATE-CITRIC ACID 500-334 MG/5ML PO SOLN: 30.0000 mL | ORAL | Status: DC | PRN

## 2018-06-15 MED ORDER — TERBUTALINE SULFATE 1 MG/ML IJ SOLN: 0.2500 mg | Freq: Once | INTRAMUSCULAR | Status: DC | PRN

## 2018-06-15 MED ORDER — EPHEDRINE 5 MG/ML INJ: 10.0000 mg | INTRAVENOUS | Status: DC | PRN

## 2018-06-15 MED ORDER — ACETAMINOPHEN 325 MG PO TABS: 650.0000 mg | ORAL_TABLET | ORAL | Status: DC | PRN

## 2018-06-15 MED ORDER — SODIUM CHLORIDE (PF) 0.9 % IJ SOLN
INTRAMUSCULAR | Status: DC | PRN
  Administered 2018-06-15: 12 mL/h via EPIDURAL

## 2018-06-15 MED ORDER — SENNOSIDES-DOCUSATE SODIUM 8.6-50 MG PO TABS
2.0000 | ORAL_TABLET | ORAL | Status: DC
  Administered 2018-06-15 – 2018-06-16 (×2): 2 via ORAL
  Filled 2018-06-15 (×2): qty 2

## 2018-06-15 MED ORDER — PHENYLEPHRINE 40 MCG/ML (10ML) SYRINGE FOR IV PUSH (FOR BLOOD PRESSURE SUPPORT)
80.0000 ug | PREFILLED_SYRINGE | INTRAVENOUS | Status: DC | PRN
  Filled 2018-06-15: qty 10

## 2018-06-15 MED ORDER — URSODIOL 300 MG PO CAPS
600.0000 mg | ORAL_CAPSULE | Freq: Two times a day (BID) | ORAL | Status: DC
  Administered 2018-06-15: 600 mg via ORAL
  Filled 2018-06-15: qty 2

## 2018-06-15 MED ORDER — SIMETHICONE 80 MG PO CHEW: 80.0000 mg | CHEWABLE_TABLET | ORAL | Status: DC | PRN

## 2018-06-15 MED ORDER — BENZOCAINE-MENTHOL 20-0.5 % EX AERO: 1.0000 "application " | INHALATION_SPRAY | CUTANEOUS | Status: DC | PRN

## 2018-06-15 MED ORDER — OXYCODONE-ACETAMINOPHEN 5-325 MG PO TABS: 2.0000 | ORAL_TABLET | ORAL | Status: DC | PRN

## 2018-06-15 MED ORDER — FENTANYL CITRATE (PF) 100 MCG/2ML IJ SOLN
100.0000 ug | INTRAMUSCULAR | Status: DC | PRN
  Administered 2018-06-15: 100 ug via INTRAVENOUS

## 2018-06-15 MED ORDER — DIBUCAINE 1 % RE OINT: 1.0000 "application " | TOPICAL_OINTMENT | RECTAL | Status: DC | PRN

## 2018-06-15 MED ORDER — COCONUT OIL OIL: 1.0000 "application " | TOPICAL_OIL | Status: DC | PRN

## 2018-06-15 MED ORDER — IBUPROFEN 600 MG PO TABS
600.0000 mg | ORAL_TABLET | Freq: Four times a day (QID) | ORAL | Status: DC
  Administered 2018-06-15 – 2018-06-17 (×6): 600 mg via ORAL
  Filled 2018-06-15 (×6): qty 1

## 2018-06-15 MED ORDER — FENTANYL-BUPIVACAINE-NACL 0.5-0.125-0.9 MG/250ML-% EP SOLN
EPIDURAL | Status: AC
  Administered 2018-06-15: 12 mL/h via EPIDURAL
  Filled 2018-06-15: qty 250

## 2018-06-15 MED ORDER — OXYTOCIN 40 UNITS IN NORMAL SALINE INFUSION - SIMPLE MED
1.0000 m[IU]/min | INTRAVENOUS | Status: DC
  Administered 2018-06-15: 2 m[IU]/min via INTRAVENOUS

## 2018-06-15 MED ORDER — TRANEXAMIC ACID-NACL 1000-0.7 MG/100ML-% IV SOLN
1000.0000 mg | INTRAVENOUS | Status: AC
  Administered 2018-06-15: 1000 mg via INTRAVENOUS

## 2018-06-15 MED ORDER — ONDANSETRON HCL 4 MG/2ML IJ SOLN: 4.0000 mg | INTRAMUSCULAR | Status: DC | PRN

## 2018-06-15 MED ORDER — OXYCODONE-ACETAMINOPHEN 5-325 MG PO TABS: 1.0000 | ORAL_TABLET | ORAL | Status: DC | PRN

## 2018-06-15 MED ORDER — LACTATED RINGERS IV SOLN: 500.0000 mL | INTRAVENOUS | Status: DC | PRN

## 2018-06-15 MED ORDER — FENTANYL CITRATE (PF) 100 MCG/2ML IJ SOLN
INTRAMUSCULAR | Status: AC
  Filled 2018-06-15: qty 2

## 2018-06-15 MED ORDER — URSODIOL 300 MG PO CAPS
600.0000 mg | ORAL_CAPSULE | Freq: Two times a day (BID) | ORAL | Status: DC
  Filled 2018-06-15 (×2): qty 2

## 2018-06-15 MED ORDER — WITCH HAZEL-GLYCERIN EX PADS: 1.0000 "application " | MEDICATED_PAD | CUTANEOUS | Status: DC | PRN

## 2018-06-15 MED ORDER — DIPHENHYDRAMINE HCL 25 MG PO CAPS: 25.0000 mg | ORAL_CAPSULE | Freq: Four times a day (QID) | ORAL | Status: DC | PRN

## 2018-06-15 MED ORDER — OXYCODONE HCL 5 MG PO TABS: 10.0000 mg | ORAL_TABLET | ORAL | Status: DC | PRN

## 2018-06-15 MED ORDER — PHENYLEPHRINE 40 MCG/ML (10ML) SYRINGE FOR IV PUSH (FOR BLOOD PRESSURE SUPPORT): 80.0000 ug | PREFILLED_SYRINGE | INTRAVENOUS | Status: DC | PRN

## 2018-06-15 MED ORDER — OXYCODONE HCL 5 MG PO TABS
5.0000 mg | ORAL_TABLET | ORAL | Status: DC | PRN
  Administered 2018-06-16: 5 mg via ORAL
  Filled 2018-06-15: qty 1

## 2018-06-15 NOTE — Anesthesia Procedure Notes (Signed)
Epidural Patient location during procedure: OB Start time: 06/15/2018 2:35 AM End time: 06/15/2018 2:37 AM  Staffing Anesthesiologist: Leilani Able, MD Performed: anesthesiologist   Preanesthetic Checklist Completed: patient identified, site marked, surgical consent, pre-op evaluation, timeout performed, IV checked, risks and benefits discussed and monitors and equipment checked  Epidural Patient position: sitting Prep: site prepped and draped and DuraPrep Patient monitoring: continuous pulse ox and blood pressure Approach: midline Location: L3-L4 Injection technique: LOR air  Needle:  Needle type: Tuohy  Needle gauge: 17 G Needle length: 9 cm and 9 Needle insertion depth: 4 cm Catheter type: closed end flexible Catheter size: 19 Gauge Catheter at skin depth: 9 cm Test dose: negative and Other  Assessment Sensory level: T9 Events: blood not aspirated, injection not painful, no injection resistance, negative IV test and no paresthesia  Additional Notes Reason for block:procedure for pain

## 2018-06-15 NOTE — H&P (Addendum)
OBSTETRIC ADMISSION HISTORY AND PHYSICAL  Kara Stewart is a 43 y.o. female G3P0020 with IUP at [redacted]w[redacted]d presenting for SOL and ROM that started at 2300hrs, she had loss of clear liquid. She reports +FM. No  blurry vision, headaches, peripheral edema, or RUQ pain. She plans on Formula feeding. She requests BTL for birth control.  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 Severe hepatic steatosis with hepatosplenomegaly with likely chronic liver disease complicated by ascites requiring abdominal paracenteses back in July 2018.  No alcohol since March 2018.  She is followed by fetal maternal specialist due to high risk pregnancy. She is also GBS +.  Dating: By 5wl USS --->  Estimated Date of Delivery: 06/23/18  Sono:   @[redacted]w[redacted]d , CWD, normal anatomy, cephalic presentation, 3087g, 70%HEK  Prenatal History/Complications: Chronic liver disease Alcoholic Hepatitis  Elevated AMA treated empirically with Ursodeoxycholic acid  Severe Hepatic Steatosis with hepatosplenomegaly  Chronic liver disease complicated by Ascites   Past Medical History: Past Medical History:  Diagnosis Date  . Acid reflux   . Alcohol abuse 07/21/2012  . 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 Alcoholic hepatitis without ascites   . Anxiety   . Asthma   . BV (bacterial vaginosis) 04/26/2015  . Colitis   . Dyspepsia 2004   Dx w/ PUD but no EGD, [place]   . EBV infection 08/11/2012  . GERD (gastroesophageal reflux disease) 07/21/2012  . Hematemesis without nausea   . Hiatal hernia   . History of alcohol abuse 04/26/2015  . History of colitis 11/29/2014  . Hypertension   . Hypertriglyceridemia   . Hypothyroid   . Iritis    frequent  . Irregular periods 01/11/2014  . Mallory-Weiss tear   . Ovarian cyst   . Panic attacks   . RLQ abdominal pain 01/11/2014  . SVT (supraventricular tachycardia)  (HCC)   . Thyroid nodule   . Trichomonal vaginitis 01/11/2014  . Vaginal discharge 01/11/2014  . Weight gain 01/11/2014    Past Surgical History: Past Surgical History:  Procedure Laterality Date  . BIOPSY N/A 02/05/2015   Procedure: BIOPSY;  Surgeon: West Bali, MD;  Location: [facility];  Service: Endoscopy;  Laterality: N/A;  . COLONOSCOPY    . COLONOSCOPY WITH PROPOFOL N/A 02/05/2015   BTC:YELYH HH/mild diverticulosis  . ESOPHAGEAL DILATION N/A 11/21/2015   Procedure: ESOPHAGEAL DILATION;  Surgeon: Corbin Ade, MD;  Location: [facility];  Service: Endoscopy;  Laterality: N/A;  . ESOPHAGOGASTRODUODENOSCOPY  11/06/2011   [name]: MILD Esophagitis/PATENT ESOPHAGEAL Stricture/  Moderate gastritis. Bx no.hpylori or celiac, +gastritis  . ESOPHAGOGASTRODUODENOSCOPY (EGD) WITH PROPOFOL N/A 03/20/2014   [name]: 1. Mild esophagitis & distal esophagela stricture. 2. small hiatal hernia 3. moderate non-erosive gastritis and mild duodenits  . ESOPHAGOGASTRODUODENOSCOPY (EGD) WITH PROPOFOL N/A 11/21/2015   Dr. Jena Gauss: LA grade B esophagitis, MW tear likely source of hematemesis  . SAVORY DILATION N/A 03/20/2014   Procedure: SAVORY DILATION;  Surgeon: West Bali, MD;  Location: [facility];  Service: Endoscopy;  Laterality: N/A;  dilated with # 12.8, 14,15,16  . TOOTH EXTRACTION      Obstetrical History: OB History    Gravida  3   Para      Term      Preterm      AB  2   Living  0     SAB  1   TAB  1   Ectopic  Multiple      Live Births              Social History: Social History   Socioeconomic History  . Marital status: Divorced    Spouse name: Not on file  . Number of children: 0  . Years of education: Not on file  . Highest education level: Not on file  Occupational History  . 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 Financial resource strain: Not on file  . Food insecurity:    Worry: Sometimes true    Inability: Often true  .  Transportation needs:    Medical: No    Non-medical: No  Tobacco Use  . Smoking status: Current Some Day Smoker    Types: Cigarettes    Last attempt to quit: 12/22/2017    Years since quitting: 0.4  . Smokeless tobacco: Never Used  . Tobacco comment: "might smoke one on a bad day"  Substance and Sexual Activity  . Alcohol use: No    Alcohol/week: 0.0 standard drinks    Comment: h/o etoh abuse, quit early 2017 but started back summer 2017.; denied 11/16/17  . Drug use: No  . Sexual activity: Yes    Birth control/protection: None  Lifestyle  . Physical activity:    Days per week: Not on file    Minutes per session: Not on file  . Stress: Very much  Relationships  . Social connections:    Talks on phone: Not on file    Gets together: Not on file    Attends religious service: Not on file    Active member of club or organization: Not on file    Attends meetings of clubs or organizations: Not on file    Relationship status: Not on file  Other Topics Concern  . Not on file  Social History Narrative   Lives alone with a roommate. Dating and in safe relationship. Does not smoke. Denies drug use.    Previous MD: Phill Mutter, NP ([place], [place])       Family History: Family History  Problem Relation Age of Onset  . Stomach cancer Paternal Grandfather        colon cancer  . Cancer Paternal Grandfather        throat and esophagus  . Breast cancer Maternal Grandmother   . Cancer Maternal Grandmother        skin  . Anxiety disorder Maternal Grandmother   . Hypertension Mother   . Other Mother        fatty liver  . Hyperlipidemia Mother   . Liver disease Mother        fatty liver, does not drink.   . Other Father        varicose veins; stomach issues; hernia  . Hypertension Father   . Hyperlipidemia Father   . Cancer Father        prostate  . Arthritis Father        rheumatoid  . Neuropathy Father   . Thyroid disease Sister   . Hypertension Sister   . Other Brother         hernia  . Diabetes Maternal Grandfather   . Heart disease Maternal Grandfather   . Other Paternal Grandmother        hernia  . COPD Paternal Grandmother   . Diabetes Paternal Grandmother     Allergies: Allergies  Allergen Reactions  . Penicillins Anaphylaxis    Has patient had  a PCN reaction causing immediate rash, facial/tongue/throat swelling, SOB or lightheadedness with hypotension: yes Has patient had a PCN reaction causing severe rash involving mucus membranes or skin necrosis: No Has patient had a PCN reaction that required hospitalization Yes Has patient had a PCN reaction occurring within the last 10 years: No If all of the above answers are "NO", then may proceed with Cephalosporin use.   . Lidocaine Other (See Comments)    hallucinations    Medications Prior to Admission  Medication Sig Dispense Refill Last Dose  . albuterol (PROVENTIL HFA;VENTOLIN HFA) 108 (90 Base) MCG/ACT inhaler Inhale 2 puffs into the lungs every 6 (six) hours as needed for wheezing or shortness of breath. 1 Inhaler 0 Past Week at Unknown time  . Prenatal Vit-Fe Fumarate-FA (PRENATAL VITAMIN PO) Take 1 tablet by mouth daily.    06/14/2018 at Unknown time  . ursodiol (ACTIGALL) 500 MG tablet Take 1 tablet (500 mg total) by mouth 2 (two) times daily. 60 tablet 5 06/14/2018 at Unknown time  . calcium-vitamin D (OSCAL WITH D) 500-200 MG-UNIT tablet Take 1 tablet by mouth.   Taking     Review of Systems   All systems reviewed and negative except as stated in HPI  Blood pressure 114/61, pulse 71, temperature 98 F (36.7 C), temperature source Oral, resp. rate 16, height  (1.651 m), weight 80.7 kg, last menstrual period 09/16/2017, unknown if currently breastfeeding. General appearance: alert, cooperative and moderate distress Lungs: regular rate and effort Heart: regular rate  Abdomen: soft, non-tender Extremities: Homans sign is negative, no sign of DVT Presentation: cephalic by sutures on RN  check Fetal monitoringBaseline: 130 bpm, Variability: Good {> 6 bpm), Accelerations: Reactive and Decelerations: Absent Uterine activityFrequency: Every 3 minutes Dilation: 4 Effacement (%): 100 Station: -1 Exam by:: Lorn Junes, RN   Prenatal labs: ABO, Rh: A POS Antibody: Negative  Rubella:  Immune  RPR:   Negative  HBsAg:   Negative  HIV:   Negative  GBS:   Positive  2 hr GTT Early 3rd Trimester   Nursing Staff Provider  Office Location  [facility] Dating  LMP=5w [place]  Language  English  Anatomy US    Flu Vaccine  10/19 Genetic Screen  NIPS:   AFP:   First Screen:  Quad:    TDaP vaccine    Hgb A1C or  GTT Early  Third trimester   Rhogam   n/a   LAB RESULTS   Feeding Plan Bottle  Blood Type   A pos  Contraception BTL  Antibody  Negative  Circumcision n/a Rubella  Immune  Pediatrician   RPR   Negative  Support Person Jason  HBsAg   Negative  Prenatal Classes  HIV   Negative  BTL Consent  signed 05-17-18 GBS  (For PCN allergy, check sensitivities)   VBAC Consent n/a Pap     Hgb Electro      CF Negative    Prenatal Transfer Tool  Maternal Diabetes: No Genetic Screening: Normal Maternal Ultrasounds/Referrals: Normal Fetal Ultrasounds or other Referrals:  None Maternal Substance Abuse:  Yes:  Type: Marijuana Significant Maternal Medications:  None Significant Maternal Lab Results: Lab values include: Group B Strep positive  Results for orders placed or performed during the hospital encounter of 06/15/18 (from the past 24 hour(s))  CBC   Collection Time: 06/15/18  1:38 AM  Result Value Ref Range   WBC 13.8 (H) 4.0 - 10.5 K/uL   RBC 4.85 3.87 - 5.11  MIL/uL   Hemoglobin 14.8 12.0 - 15.0 g/dL   HCT 39.7 67.3 - 41.9 %   MCV 87.2 80.0 - 100.0 fL   MCH 30.5 26.0 - 34.0 pg   MCHC 35.0 30.0 - 36.0 g/dL   RDW 37.9 02.4 - 09.7 %   Platelets 187 150 - 400 K/uL   nRBC 0.0 0.0 - 0.2 %  Urine rapid drug screen (hosp performed)   Collection Time: 06/15/18  1:38 AM  Result Value  Ref Range   Opiates NONE DETECTED NONE DETECTED   Cocaine NONE DETECTED NONE DETECTED   Benzodiazepines NONE DETECTED NONE DETECTED   Amphetamines NONE DETECTED NONE DETECTED   Tetrahydrocannabinol POSITIVE (A) NONE DETECTED   Barbiturates NONE DETECTED NONE DETECTED  Type and screen   Collection Time: 06/15/18  1:38 AM  Result Value Ref Range   ABO/RH(D) A POS    Antibody Screen NEG    Sample Expiration      06/18/2018 Performed at [facility], [address] [place], [place] [address]   ABO/Rh   Collection Time: 06/15/18  1:38 AM  Result Value Ref Range   ABO/RH(D)      A POS Performed at [facility], [address], [place], [place] [address]   Fern Test   Collection Time: 06/15/18  1:46 AM  Result Value Ref Range   POCT Fern Test Positive = ruptured amniotic membanes   Comprehensive metabolic panel   Collection Time: 06/15/18  2:42 AM  Result Value Ref Range   Sodium 137 135 - 145 mmol/L   Potassium 4.0 3.5 - 5.1 mmol/L   Chloride 107 98 - 111 mmol/L   CO2 22 22 - 32 mmol/L   Glucose, Bld 104 (H) 70 - 99 mg/dL   BUN 9 6 - 20 mg/dL   Creatinine, Ser 8.34 0.44 - 1.00 mg/dL   Calcium 9.3 8.9 - 19.6 mg/dL   Total Protein 6.2 (L) 6.5 - 8.1 g/dL   Albumin 2.7 (L) 3.5 - 5.0 g/dL   AST 22 15 - 41 U/L   ALT 11 0 - 44 U/L   Alkaline Phosphatase 163 (H) 38 - 126 U/L   Total Bilirubin 0.5 0.3 - 1.2 mg/dL   GFR calc non Af Amer >60 >60 mL/min   GFR calc Af Amer >60 >60 mL/min   Anion gap 8 5 - 15  Protime-INR   Collection Time: 06/15/18  2:42 AM  Result Value Ref Range   Prothrombin Time 13.0 11.4 - 15.2 seconds   INR 1.0 0.8 - 1.2  APTT   Collection Time: 06/15/18  2:42 AM  Result Value Ref Range   aPTT 30 24 - 36 seconds    Patient Active Problem List   Diagnosis Date Noted  . 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 Indication for care in labor and delivery, antepartum 06/15/2018  . Chronic hypertension affecting pregnancy 06/15/2018  . AMA  (advanced maternal age) multigravida 35+ 06/08/2018  . Supervision of high risk pregnancy, antepartum 05/03/2018  . Chronic liver disease 11/16/2017  . History of marijuana use 01/20/2015  . Abnormal CT scan, colon 01/08/2015  . Ovarian cyst 11/29/2014  . Anxiety 11/29/2014  . History of colitis 11/29/2014  . Thyroid nodule 01/11/2014  . SVT (supraventricular tachycardia) (HCC) 09/07/2012  . History of alcohol use 07/21/2012  . Fatty liver 10/28/2011    Assessment: Kara Stewart is a 43 y.o. female G3P0020 with IUP at [redacted]w[redacted]d  GBS+ presenting  for SOL and ROM Fern+.  1. Labor: Transitioning into active labor.  2. FWB: Cat 1  3. Pain: Moderate  4. GBS: Positive  5. Chronic liver disease  Hx of Alcoholic Hepatitis  Elevated AMA treated empirically with Ursodeoxycholic acid  Severe Hepatic Steatosis with hepatosplenomegaly  Chronic liver disease complicated by History of Ascites  6. cHTN - not on medications, continue to monitor closely   Plan: Admission to L&D  Expectant management for NSVD  APTT, CMP, INR, UDS, ABO Rh, Type and Screen Epidural  Vancomycin 1g Q12hr due to penicillin allergy and no sensitivities  Cervical checks Q4 hrs  Continue Maternal fetal monitoring   Tamera Stands, DO PGY 1 Family Medicine Resident [facility]  06/15/2018, 9:11 AM   Attestation: I have seen this patient and agree with the resident's documentation. I have examined them separately, and we have discussed the plan of care.  Cristal Deer. Earlene Plater, DO OB/GYN Fellow

## 2018-06-15 NOTE — Anesthesia Preprocedure Evaluation (Signed)
Anesthesia Evaluation  Patient identified by MRN, date of birth, ID band Patient awake    Reviewed: Allergy & Precautions, H&P , NPO status , Patient's Chart, lab work & pertinent test results  Airway Mallampati: II  TM Distance: >3 FB Neck ROM: full    Dental no notable dental hx. (+) Teeth Intact   Pulmonary Current Smoker,    Pulmonary exam normal breath sounds clear to auscultation       Cardiovascular hypertension, negative cardio ROS Normal cardiovascular exam Rhythm:regular Rate:Normal     Neuro/Psych negative neurological ROS  negative psych ROS   GI/Hepatic negative GI ROS, Neg liver ROS,   Endo/Other  negative endocrine ROS  Renal/GU negative Renal ROS     Musculoskeletal   Abdominal Normal abdominal exam  (+)   Peds  Hematology negative hematology ROS (+)   Anesthesia Other Findings   Reproductive/Obstetrics (+) Pregnancy                             Anesthesia Physical Anesthesia Plan  ASA: II  Anesthesia Plan: Epidural   Post-op Pain Management:    Induction:   PONV Risk Score and Plan:   Airway Management Planned:   Additional Equipment:   Intra-op Plan:   Post-operative Plan:   Informed Consent: I have reviewed the patients History and Physical, chart, labs and discussed the procedure including the risks, benefits and alternatives for the proposed anesthesia with the patient or authorized representative who has indicated his/her understanding and acceptance.       Plan Discussed with:   Anesthesia Plan Comments:         Anesthesia Quick Evaluation

## 2018-06-15 NOTE — Progress Notes (Addendum)
LABOR PROGRESS NOTE  Kara Stewart is a 43 y.o. G3P0020 at [redacted]w[redacted]d  admitted for SOL/SROM.  Subjective: Sleeping comfortably in bed with family at bedside.  Objective: BP 115/71   Pulse 76   Temp 98 F (36.7 C) (Oral)   Resp 17   Ht 5\' 5"  (1.651 m)   Wt 80.7 kg   LMP 09/16/2017 (Approximate)   BMI 29.62 kg/m  or  Vitals:   06/15/18 0801 06/15/18 0900 06/15/18 0931 06/15/18 1000  BP: 110/64 114/61 114/66 115/71  Pulse: 68 71 78 76  Resp: 16 16 17    Temp: 98 F (36.7 C)     TempSrc: Oral     Weight:      Height:        Dilation: 6 Effacement (%): 100 Station: Plus 1 Presentation: Vertex Exam by:: Lorn Junes, RN FHT: baseline rate 140 bpm, moderate varibility, + acel, early decels Toco: ctx q1-4 mins  Labs: Lab Results  Component Value Date   WBC 13.8 (H) 06/15/2018   HGB 14.8 06/15/2018   HCT 42.3 06/15/2018   MCV 87.2 06/15/2018   PLT 187 06/15/2018    Patient Active Problem List   Diagnosis Date Noted  . 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 Indication for care in labor and delivery, antepartum 06/15/2018  . Chronic hypertension affecting pregnancy 06/15/2018  . AMA (advanced maternal age) multigravida 35+ 06/08/2018  . Supervision of high risk pregnancy, antepartum 05/03/2018  . Chronic liver disease 11/16/2017  . History of marijuana use 01/20/2015  . Abnormal CT scan, colon 01/08/2015  . Ovarian cyst 11/29/2014  . Anxiety 11/29/2014  . History of colitis 11/29/2014  . Thyroid nodule 01/11/2014  . SVT (supraventricular tachycardia) (HCC) 09/07/2012  . History of alcohol use 07/21/2012  . Fatty liver 10/28/2011    Assessment / Plan: 43 y.o. G3P0020 at [redacted]w[redacted]d here for SOL/SROM.  Labor: Progressing well. Continue Pit 2x2 Fetal Wellbeing:  Cat I GBS +: Cont Vanc (due to allergy to PCN) Pain Control:  Epidural in place Anticipated MOD:  NSVD  Orpah Cobb, D.O. [facility], PGY1 06/15/2018, 11:27 AM

## 2018-06-15 NOTE — Progress Notes (Deleted)
OBSTETRIC ADMISSION HISTORY AND PHYSICAL  Kara Stewart is a 43 y.o. female G3P0020 with IUP at [redacted]w[redacted]d presenting for SOL and ROM that started at 2300hrs, she had loss of clear liquid. She reports +FM. No  blurry vision, headaches, peripheral edema, or RUQ pain. She plans on Formula feeding. She requests BTL for birth control.  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 Severe hepatic steatosis with hepatosplenomegaly with likely chronic liver disease complicated by ascites requiring abdominal paracenteses back in July 2018.  No alcohol since March 2018.  She is followed by fetal maternal specialist due to high risk pregnancy. She is also GBS +.  Dating: By 5wl USS --->  Estimated Date of Delivery: 06/23/18  Sono:   @[redacted]w[redacted]d , CWD, normal anatomy, cephalic presentation, 3087g, 70%HEK  Prenatal History/Complications: Chronic liver disease Alcoholic Hepatitis  Elevated AMA treated empirically with Ursodeoxycholic acid  Severe Hepatic Steatosis with hepatosplenomegaly  Chronic liver disease complicated by Ascites   Past Medical History: Past Medical History:  Diagnosis Date  . Acid reflux   . Alcohol abuse 07/21/2012  . 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 Alcoholic hepatitis without ascites   . Anxiety   . Asthma   . BV (bacterial vaginosis) 04/26/2015  . Colitis   . Dyspepsia 2004   Dx w/ PUD but no EGD, [place]   . EBV infection 08/11/2012  . GERD (gastroesophageal reflux disease) 07/21/2012  . Hematemesis without nausea   . Hiatal hernia   . History of alcohol abuse 04/26/2015  . History of colitis 11/29/2014  . Hypertension   . Hypertriglyceridemia   . Hypothyroid   . Iritis    frequent  . Irregular periods 01/11/2014  . Mallory-Weiss tear   . Ovarian cyst   . Panic attacks   . RLQ abdominal pain 01/11/2014  . SVT (supraventricular tachycardia)  (HCC)   . Thyroid nodule   . Trichomonal vaginitis 01/11/2014  . Vaginal discharge 01/11/2014  . Weight gain 01/11/2014    Past Surgical History: Past Surgical History:  Procedure Laterality Date  . BIOPSY N/A 02/05/2015   Procedure: BIOPSY;  Surgeon: West Bali, MD;  Location: [facility];  Service: Endoscopy;  Laterality: N/A;  . COLONOSCOPY    . COLONOSCOPY WITH PROPOFOL N/A 02/05/2015   BTC:YELYH HH/mild diverticulosis  . ESOPHAGEAL DILATION N/A 11/21/2015   Procedure: ESOPHAGEAL DILATION;  Surgeon: Corbin Ade, MD;  Location: [facility];  Service: Endoscopy;  Laterality: N/A;  . ESOPHAGOGASTRODUODENOSCOPY  11/06/2011   [name]: MILD Esophagitis/PATENT ESOPHAGEAL Stricture/  Moderate gastritis. Bx no.hpylori or celiac, +gastritis  . ESOPHAGOGASTRODUODENOSCOPY (EGD) WITH PROPOFOL N/A 03/20/2014   [name]: 1. Mild esophagitis & distal esophagela stricture. 2. small hiatal hernia 3. moderate non-erosive gastritis and mild duodenits  . ESOPHAGOGASTRODUODENOSCOPY (EGD) WITH PROPOFOL N/A 11/21/2015   Dr. Jena Gauss: LA grade B esophagitis, MW tear likely source of hematemesis  . SAVORY DILATION N/A 03/20/2014   Procedure: SAVORY DILATION;  Surgeon: West Bali, MD;  Location: [facility];  Service: Endoscopy;  Laterality: N/A;  dilated with # 12.8, 14,15,16  . TOOTH EXTRACTION      Obstetrical History: OB History    Gravida  3   Para      Term      Preterm      AB  2   Living  0     SAB  1   TAB  1   Ectopic  Multiple      Live Births              Social History: Social History   Socioeconomic History  . Marital status: Divorced    Spouse name: Not on file  . Number of children: 0  . Years of education: Not on file  . Highest education level: Not on file  Occupational History  . 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 Financial resource strain: Not on file  . Food insecurity:    Worry: Sometimes true    Inability: Often true  .  Transportation needs:    Medical: No    Non-medical: No  Tobacco Use  . Smoking status: Current Some Day Smoker    Types: Cigarettes    Last attempt to quit: 12/22/2017    Years since quitting: 0.4  . Smokeless tobacco: Never Used  . Tobacco comment: "might smoke one on a bad day"  Substance and Sexual Activity  . Alcohol use: No    Alcohol/week: 0.0 standard drinks    Comment: h/o etoh abuse, quit early 2017 but started back summer 2017.; denied 11/16/17  . Drug use: No  . Sexual activity: Yes    Birth control/protection: None  Lifestyle  . Physical activity:    Days per week: Not on file    Minutes per session: Not on file  . Stress: Very much  Relationships  . Social connections:    Talks on phone: Not on file    Gets together: Not on file    Attends religious service: Not on file    Active member of club or organization: Not on file    Attends meetings of clubs or organizations: Not on file    Relationship status: Not on file  Other Topics Concern  . Not on file  Social History Narrative   Lives alone with a roommate. Dating and in safe relationship. Does not smoke. Denies drug use.    Previous MD: Phill Mutter, NP ([place], [place])       Family History: Family History  Problem Relation Age of Onset  . Stomach cancer Paternal Grandfather        colon cancer  . Cancer Paternal Grandfather        throat and esophagus  . Breast cancer Maternal Grandmother   . Cancer Maternal Grandmother        skin  . Anxiety disorder Maternal Grandmother   . Hypertension Mother   . Other Mother        fatty liver  . Hyperlipidemia Mother   . Liver disease Mother        fatty liver, does not drink.   . Other Father        varicose veins; stomach issues; hernia  . Hypertension Father   . Hyperlipidemia Father   . Cancer Father        prostate  . Arthritis Father        rheumatoid  . Neuropathy Father   . Thyroid disease Sister   . Hypertension Sister   . Other Brother         hernia  . Diabetes Maternal Grandfather   . Heart disease Maternal Grandfather   . Other Paternal Grandmother        hernia  . COPD Paternal Grandmother   . Diabetes Paternal Grandmother     Allergies: Allergies  Allergen Reactions  . Penicillins Anaphylaxis    Has patient had  a PCN reaction causing immediate rash, facial/tongue/throat swelling, SOB or lightheadedness with hypotension: yes Has patient had a PCN reaction causing severe rash involving mucus membranes or skin necrosis: No Has patient had a PCN reaction that required hospitalization Yes Has patient had a PCN reaction occurring within the last 10 years: No If all of the above answers are "NO", then may proceed with Cephalosporin use.   . Lidocaine Other (See Comments)    hallucinations    Medications Prior to Admission  Medication Sig Dispense Refill Last Dose  . albuterol (PROVENTIL HFA;VENTOLIN HFA) 108 (90 Base) MCG/ACT inhaler Inhale 2 puffs into the lungs every 6 (six) hours as needed for wheezing or shortness of breath. 1 Inhaler 0 Past Week at Unknown time  . Prenatal Vit-Fe Fumarate-FA (PRENATAL VITAMIN PO) Take 1 tablet by mouth daily.    06/14/2018 at Unknown time  . ursodiol (ACTIGALL) 500 MG tablet Take 1 tablet (500 mg total) by mouth 2 (two) times daily. 60 tablet 5 06/14/2018 at Unknown time  . calcium-vitamin D (OSCAL WITH D) 500-200 MG-UNIT tablet Take 1 tablet by mouth.   Taking     Review of Systems   All systems reviewed and negative except as stated in HPI  Blood pressure 130/78, pulse 91, temperature 98.6 F (37 C), temperature source Oral, resp. rate 18, height 5\' 5"  (1.651 m), weight 80.7 kg, last menstrual period 09/16/2017, unknown if currently breastfeeding. General appearance: alert, cooperative and moderate distress Lungs: regular rate and effort Heart: regular rate  Abdomen: soft, non-tender Extremities: Homans sign is negative, no sign of DVT Presentation: cephalic by sutures on RN  check Fetal monitoringBaseline: 130 bpm, Variability: Good {> 6 bpm), Accelerations: Reactive and Decelerations: Absent Uterine activityFrequency: Every 3 minutes Dilation: 2 Effacement (%): 100 Exam by:: HEATHER, CNM   Prenatal labs: ABO, Rh: A POS Antibody: Negative  Rubella:  Immune  RPR:   Negative  HBsAg:   Negative  HIV:   Negative  GBS:   Positive  2 hr GTT Early 3rd Trimester   Nursing Staff Provider  Office Location  [facility] Dating  LMP=5w US  Language  English  Anatomy US    Flu Vaccine  10/19 Genetic Screen  NIPS:   AFP:   First Screen:  Quad:    TDaP vaccine    Hgb A1C or  GTT Early  Third trimester   Rhogam   n/a   LAB RESULTS   Feeding Plan Bottle  Blood Type   A pos  Contraception BTL  Antibody  Negative  Circumcision n/a Rubella  Immune  Pediatrician   RPR   Negative  Support Person Jason  HBsAg   Negative  Prenatal Classes  HIV   Negative  BTL Consent  signed 05-17-18 GBS  (For PCN allergy, check sensitivities)   VBAC Consent n/a Pap     Hgb Electro      CF Negative    Prenatal Transfer Tool  Maternal Diabetes: No Genetic Screening: Normal Maternal Ultrasounds/Referrals: Normal Fetal Ultrasounds or other Referrals:  None Maternal Substance Abuse:  Yes:  Type: Marijuana Significant Maternal Medications:  None Significant Maternal Lab Results: Lab values include: Group B Strep positive  Results for orders placed or performed during the hospital encounter of 06/15/18 (from the past 24 hour(s))  CBC   Collection Time: 06/15/18  1:38 AM  Result Value Ref Range   WBC 13.8 (H) 4.0 - 10.5 K/uL   RBC 4.85 3.87 - 5.11 MIL/uL  Hemoglobin 14.8 12.0 - 15.0 g/dL   HCT 16.1 09.6 - 04.5 %   MCV 87.2 80.0 - 100.0 fL   MCH 30.5 26.0 - 34.0 pg   MCHC 35.0 30.0 - 36.0 g/dL   RDW 40.9 81.1 - 91.4 %   Platelets 187 150 - 400 K/uL   nRBC 0.0 0.0 - 0.2 %  Type and screen   Collection Time: 06/15/18  1:38 AM  Result Value Ref Range   ABO/RH(D) PENDING     Antibody Screen PENDING    Sample Expiration      06/18/2018 Performed at [facility], [address], [place], [place] [address]   Crist Fat Test   Collection Time: 06/15/18  1:46 AM  Result Value Ref Range   POCT Fern Test Positive = ruptured amniotic membanes     Patient Active Problem List   Diagnosis Date Noted  . 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 Indication for care in labor and delivery, antepartum 06/15/2018  . AMA (advanced maternal age) multigravida 35+ 06/08/2018  . Supervision of high risk pregnancy, antepartum 05/03/2018  . Chronic liver disease 11/16/2017  . History of marijuana use 01/20/2015  . Abnormal CT scan, colon 01/08/2015  . Ovarian cyst 11/29/2014  . Anxiety 11/29/2014  . History of colitis 11/29/2014  . Thyroid nodule 01/11/2014  . SVT (supraventricular tachycardia) (HCC) 09/07/2012  . History of alcohol use 07/21/2012  . Fatty liver 10/28/2011    Assessment: Kara Stewart is a 43 y.o. female G3P0020 with IUP at [redacted]w[redacted]d  GBS+ presenting for SOL and ROM Fern+.  1. Labor: Transitioning into active labor.  2. FWB: Cat 1  3. Pain: Moderate  4. GBS: Positive  5. Chronic liver disease  Hx of Alcoholic Hepatitis  Elevated AMA treated empirically with Ursodeoxycholic acid  Severe Hepatic Steatosis with hepatosplenomegaly  Chronic liver disease complicated by History of Ascites  6. cHTN - not on medications, continue to monitor closely   Plan: Admission to L&D  Expectant management for NSVD  APTT, CMP, INR, UDS, ABO Rh, Type and Screen Epidural  Vancomycin 1g Q12hr due to penicillin allergy and no sensitivities  Cervical checks Q4 hrs  Continue Maternal fetal monitoring   Sandi Raveling, MD PGY 1 Family Medicine Resident [facility] [place]  06/15/2018, 2:10 AM   Attestation: I have seen this patient and agree with the resident's documentation. I have examined them separately, and we have discussed the plan of care.  Cristal Deer.  Earlene Plater, DO OB/GYN Fellow

## 2018-06-15 NOTE — MAU Provider Note (Signed)
S: Ms. Tanicia Weatherwax is a 43 y.o. G3P0020 at [redacted]w[redacted]d  who presents to MAU today complaining contractions q 2 minutes since 2100. She denies vaginal bleeding. She endorses LOF. She reports normal fetal movement.    O: LMP 09/16/2017 (Approximate)  GENERAL: Well-developed, well-nourished female in no acute distress.  HEAD: Normocephalic, atraumatic.  CHEST: Normal effort of breathing, regular heart rate ABDOMEN: Soft, nontender, gravid  Cervical exam:  Dilation: 1 Effacement (%): 100 Presentation: Vertex Exam by:: Xan Sparkman, CNM   Fetal Monitoring: Baseline: 125 Variability: moderate Accelerations: 15x15 Decelerations: none Contractions: q 3 mins   A: SIUP at [redacted]w[redacted]d  Active labor  SROM  P: Admit to labor and delivery  Orders signed and held GBS+  Thressa Sheller DNP, CNM  06/15/18  1:21 AM

## 2018-06-15 NOTE — Discharge Summary (Signed)
Postpartum Discharge Summary     Patient Name: Kara Stewart DOB: [id number] MRN: [id number]  Date of admission: 06/15/2018 Delivering Provider: Gwenevere Abbot   Date of discharge: 06/17/2018  Admitting diagnosis: 38.5WKS WATER BROKE,CTX Intrauterine pregnancy: [redacted]w[redacted]d     Secondary diagnosis:  Principal Problem:   Indication for care in labor and delivery, antepartum Active Problems:   History of alcohol use   Anxiety   Chronic liver disease   AMA (advanced maternal age) multigravida 35+   Primary biliary cirrhosis (HCC)   Chronic hypertension affecting pregnancy     Discharge diagnosis: Term Pregnancy Delivered                                                                      Postpartum procedures:TXA x 1 given at time of delivery given increased risk for postpartum hemorrhage Augmentation: Pitocin Complications: None  Hospital course:  Onset of Labor With Vaginal Delivery     43 y.o. yo G3P0020 at [redacted]w[redacted]d was admitted in Latent Labor on 06/15/2018. Patient had an uncomplicated labor course as follows:  Membrane Rupture Time/Date: 11:00 PM ,06/15/2018   Intrapartum Procedures: Episiotomy: None [1]                                         Lacerations:  None [1]  Patient had a delivery of a Viable infant. 06/15/2018  Information for the patient's newborn:  Ofa, Issa [[id number]]  Delivery Method: Vaginal, Spontaneous(Filed from Delivery Summary)  Patient had an uncomplicated postpartum course.  She is ambulating, tolerating a regular diet, passing flatus, and urinating well. She was initially planning on a postpartum tubal ligation but changed her mind. Currently undecided on plan for birth control. Patient is discharged home in stable condition on 06/17/18.  Magnesium Sulfate recieved: No BMZ received: No  Physical exam  Vitals:   06/16/18 0330 06/16/18 0830 06/16/18 2309 06/17/18 0615  BP: 116/69 110/73 115/64 119/64  Pulse: 72 71 72 64  Resp: 18 16 18 18   Temp:  97.9 F (36.6 C) 98 F (36.7 C) 98.2 F (36.8 C) 98 F (36.7 C)  TempSrc: Oral Oral Oral Oral  SpO2: 99% 98%  100%  Weight:      Height:       General: alert, well-appearing, NAD Lochia: appropriate Uterine Fundus: firm Incision: N/A DVT Evaluation: Trace bilateral edema  Labs: Lab Results  Component Value Date   WBC 13.8 (H) 06/15/2018   HGB 14.8 06/15/2018   HCT 42.3 06/15/2018   MCV 87.2 06/15/2018   PLT 187 06/15/2018   CMP Latest Ref Rng & Units 06/15/2018  Glucose 70 - 99 mg/dL 233(A)  BUN 6 - 20 mg/dL 9  Creatinine 0.76 - 2.26 mg/dL 3.33  Sodium 545 - 625 mmol/L 137  Potassium 3.5 - 5.1 mmol/L 4.0  Chloride 98 - 111 mmol/L 107  CO2 22 - 32 mmol/L 22  Calcium 8.9 - 10.3 mg/dL 9.3  Total Protein 6.5 - 8.1 g/dL 6.2(L)  Total Bilirubin 0.3 - 1.2 mg/dL 0.5  Alkaline Phos 38 - 126 U/L 163(H)  AST 15 - 41 U/L 22  ALT 0 - 44 U/L  11    Discharge instruction: per After Visit Summary and "Baby and Me Booklet".  After visit meds:  Allergies as of 06/17/2018      Reactions   Penicillins Anaphylaxis   Has patient had a PCN reaction causing immediate rash, facial/tongue/throat swelling, SOB or lightheadedness with hypotension: yes Has patient had a PCN reaction causing severe rash involving mucus membranes or skin necrosis: No Has patient had a PCN reaction that required hospitalization Yes Has patient had a PCN reaction occurring within the last 10 years: No If all of the above answers are "NO", then may proceed with Cephalosporin use.   Lidocaine Other (See Comments)   Can use topical lidocaine but if ingested causes Hallucinations.      Medication List    STOP taking these medications   CALCIUM-MAGNESIUM-VITAMIN D PO   ursodiol 500 MG tablet Commonly known as:  ACTIGALL     TAKE these medications   albuterol 108 (90 Base) MCG/ACT inhaler Commonly known as:  PROVENTIL HFA;VENTOLIN HFA Inhale 2 puffs into the lungs every 6 (six) hours as needed for wheezing  or shortness of breath.   ibuprofen 600 MG tablet Commonly known as:  ADVIL,MOTRIN Take 1 tablet (600 mg total) by mouth every 6 (six) hours.   prenatal multivitamin Tabs tablet Take 1 tablet by mouth daily at 12 noon.   senna-docusate 8.6-50 MG tablet Commonly known as:  Senokot-S Take 2 tablets by mouth daily. Start taking on:  June 18, 2018      Diet: routine diet Activity: Advance as tolerated. Pelvic rest for 6 weeks.   Follow up Appt: Future Appointments  Date Time Provider Department Center  07/14/2018  9:35 AM Allie Bossier, MD [facility]  10/10/2018  9:30 AM Tiffany Kocher, PA-C [facility]   Follow up Visit: Follow-up Information    [facility]. Schedule an appointment as soon as possible for a visit.   Specialty:  Obstetrics and Gynecology Why:  You should receive a call to schedule a postpartum follow-up visit. If you do not, please call clinic to make an appointment in 4-6 weeks. Contact information: [address] [place] [address] [phone]         Newborn Data: Live born female  Birth Weight:   APGAR: 9, 9  Newborn Delivery   Birth date/time:  06/15/2018 17:11:00 Delivery type:  Vaginal, Spontaneous    Baby Feeding: Formula Disposition:home with mother  06/17/2018 Tamera Stands, DO

## 2018-06-15 NOTE — Progress Notes (Addendum)
Labor Progress Note Kara Stewart is a 43 y.o. female G3P0020 with IUP at [redacted]w[redacted]d presenting for SOL and ROM that started at 2300hrs  S:  Pain is a lot more manageable after epidural, No HA, RUQ, Blurry vision   O:  BP 120/63   Pulse 76   Temp 97.6 F (36.4 C) (Oral)   Resp 14   Ht 5\' 5"  (1.651 m)   Wt 80.7 kg   LMP 09/16/2017 (Approximate)   BMI 29.62 kg/m  EFM: baseline 140 bpm/ >6 variability/ + accels/ - decels  Toco: every 3-5 min SVE: Dilation: 3 Effacement (%): 100 Station: -1 Presentation: Vertex Exam by:: Alvis  Pitocin: _ mu/min  A/P: Kara Stewart is a 43 y.o. female G3P0020 with IUP at [redacted]w[redacted]d presenting for SOL and ROM that started at 2300hrs. CMP, PT, INR, CBC wnl.   1. Labor: Latent  2. FWB: Cat 1 3. Pain: Mild  4.Chronic liver disease 5.Alcoholic Hepatitis  6.Elevated AMA treated empirically with Ursodeoxycholic acid  7.Severe Hepatic Steatosis with hepatosplenomegaly  8.Chronic liver disease complicated by Ascites  9.GBS +   -Anticipate SVD  -Continue to labour, she is making change so will not start pitocin yet -Adequate GBS prophylaxis with Vanc  -Cervical exam Q4h   Sandi Raveling, MD Family Medicine Resident [facility] [place]  6:32 AM   Attestation: I have seen this patient and agree with the resident's documentation. I have examined them separately, and we have discussed the plan of care. We will plan to start pitocin given only small interval change and ROM.   Cristal Deer. Earlene Plater, DO OB/GYN Fellow

## 2018-06-15 NOTE — Progress Notes (Signed)
LABOR PROGRESS NOTE  Kara Stewart is a 43 y.o. G3P0020 at [redacted]w[redacted]d  admitted for SOL/SROM.  Subjective: Patient notes itchiness on palms. She has missed two doses of her ursodiol. Does not feel pressure in pelvis yet. Do feel some contractions, but otherwise, no concerns or complaints. Comfortable with epidural.  Objective: BP (!) 119/58   Pulse 78   Temp 98 F (36.7 C) (Oral)   Resp 18   Ht 5\' 5"  (1.651 m)   Wt 80.7 kg   LMP 09/16/2017 (Approximate)   BMI 29.62 kg/m  or  Vitals:   06/15/18 1301 06/15/18 1330 06/15/18 1401 06/15/18 1432  BP: (!) 120/48 102/63 116/68 (!) 119/58  Pulse: 85 80 78 78  Resp:  18 16 18   Temp:   98 F (36.7 C)   TempSrc:   Oral   Weight:      Height:        Dilation: 9 Effacement (%): 100 Station: Plus 1 Presentation: Vertex Exam by:: Lorn Junes, RN FHT: baseline rate 135 bpm, moderate varibility, + acel, occasional variable decels Toco: ctx q2-3 mins  Labs: Lab Results  Component Value Date   WBC 13.8 (H) 06/15/2018   HGB 14.8 06/15/2018   HCT 42.3 06/15/2018   MCV 87.2 06/15/2018   PLT 187 06/15/2018    Patient Active Problem List   Diagnosis Date Noted  . 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 Indication for care in labor and delivery, antepartum 06/15/2018  . Chronic hypertension affecting pregnancy 06/15/2018  . AMA (advanced maternal age) multigravida 35+ 06/08/2018  . Supervision of high risk pregnancy, antepartum 05/03/2018  . Chronic liver disease 11/16/2017  . History of marijuana use 01/20/2015  . Abnormal CT scan, colon 01/08/2015  . Ovarian cyst 11/29/2014  . Anxiety 11/29/2014  . History of colitis 11/29/2014  . Thyroid nodule 01/11/2014  . SVT (supraventricular tachycardia) (HCC) 09/07/2012  . History of alcohol use 07/21/2012  . Fatty liver 10/28/2011    Assessment / Plan: 43 y.o. G3P0020 at [redacted]w[redacted]d here for SOL/SROM  Labor: Continuing to progress well. Continue pitocin Fetal Wellbeing:  Cat I Pain  Control:  Epidural in place Anticipated MOD:  NSVD  Orpah Cobb, D.O. [facility], PGY1 06/15/2018, 3:13 PM

## 2018-06-15 NOTE — Anesthesia Pain Management Evaluation Note (Signed)
  [facility] Pain Management Visit Note  Patient: Kara Stewart, 43 y.o., female  "Hello I am a member of the anesthesia team at [facility]. We have an anesthesia team available at all times to provide care throughout the hospital, including epidural management and anesthesia for C-section. I don't know your plan for the delivery whether it a natural birth, water birth, IV sedation, nitrous supplementation, doula or epidural, but we want to meet your pain goals."   1.Was your pain managed to your expectations on prior hospitalizations?   No prior hospitalizations  2.What is your expectation for pain management during this hospitalization?     Epidural  3.How can we help you reach that goal?   Record the patient's initial score and the patient's pain goal.   Pain: 0  Pain Goal: 5 [facility] wants you to be able to say your pain was always managed very well.  Laban Emperor 06/15/2018

## 2018-06-15 NOTE — Discharge Instructions (Signed)
Vaginal Delivery, Care After °Refer to this sheet in the next few weeks. These instructions provide you with information about caring for yourself after vaginal delivery. Your health care provider may also give you more specific instructions. Your treatment has been planned according to current medical practices, but problems sometimes occur. Call your health care provider if you have any problems or questions. °What can I expect after the procedure? °After vaginal delivery, it is common to have: °· Some bleeding from your vagina. °· Soreness in your abdomen, your vagina, and the area of skin between your vaginal opening and your anus (perineum). °· Pelvic cramps. °· Fatigue. °Follow these instructions at home: °Medicines °· Take over-the-counter and prescription medicines only as told by your health care provider. °· If you were prescribed an antibiotic medicine, take it as told by your health care provider. Do not stop taking the antibiotic until it is finished. °Driving ° °· Do not drive or operate heavy machinery while taking prescription pain medicine. °· Do not drive for 24 hours if you received a sedative. °Lifestyle °· Do not drink alcohol. This is especially important if you are breastfeeding or taking medicine to relieve pain. °· Do not use tobacco products, including cigarettes, chewing tobacco, or e-cigarettes. If you need help quitting, ask your health care provider. °Eating and drinking °· Drink at least 8 eight-ounce glasses of water every day unless you are told not to by your health care provider. If you choose to breastfeed your baby, you may need to drink more water than this. °· Eat high-fiber foods every day. These foods may help prevent or relieve constipation. High-fiber foods include: °? Whole grain cereals and breads. °? Brown rice. °? Beans. °? Fresh fruits and vegetables. °Activity °· Return to your normal activities as told by your health care provider. Ask your health care provider what  activities are safe for you. °· Rest as much as possible. Try to rest or take a nap when your baby is sleeping. °· Do not lift anything that is heavier than your baby or 10 lb (4.5 kg) until your health care provider says that it is safe. °· Talk with your health care provider about when you can engage in sexual activity. This may depend on your: °? Risk of infection. °? Rate of healing. °? Comfort and desire to engage in sexual activity. °Vaginal Care °· If you have an episiotomy or a vaginal tear, check the area every day for signs of infection. Check for: °? More redness, swelling, or pain. °? More fluid or blood. °? Warmth. °? Pus or a bad smell. °· Do not use tampons or douches until your health care provider says this is safe. °· Watch for any blood clots that may pass from your vagina. These may look like clumps of dark red, brown, or black discharge. °General instructions °· Keep your perineum clean and dry as told by your health care provider. °· Wear loose, comfortable clothing. °· Wipe from front to back when you use the toilet. °· Ask your health care provider if you can shower or take a bath. If you had an episiotomy or a perineal tear during labor and delivery, your health care provider may tell you not to take baths for a certain length of time. °· Wear a bra that supports your breasts and fits you well. °· If possible, have someone help you with household activities and help care for your baby for at least a few days after you   leave the hospital. °· Keep all follow-up visits for you and your baby as told by your health care provider. This is important. °Contact a health care provider if: °· You have: °? Vaginal discharge that has a bad smell. °? Difficulty urinating. °? Pain when urinating. °? A sudden increase or decrease in the frequency of your bowel movements. °? More redness, swelling, or pain around your episiotomy or vaginal tear. °? More fluid or blood coming from your episiotomy or vaginal  tear. °? Pus or a bad smell coming from your episiotomy or vaginal tear. °? A fever. °? A rash. °? Little or no interest in activities you used to enjoy. °? Questions about caring for yourself or your baby. °· Your episiotomy or vaginal tear feels warm to the touch. °· Your episiotomy or vaginal tear is separating or does not appear to be healing. °· Your breasts are painful, hard, or turn red. °· You feel unusually sad or worried. °· You feel nauseous or you vomit. °· You pass large blood clots from your vagina. If you pass a blood clot from your vagina, save it to show to your health care provider. Do not flush blood clots down the toilet without having your health care provider look at them. °· You urinate more than usual. °· You are dizzy or light-headed. °· You have not breastfed at all and you have not had a menstrual period for 12 weeks after delivery. °· You have stopped breastfeeding and you have not had a menstrual period for 12 weeks after you stopped breastfeeding. °Get help right away if: °· You have: °? Pain that does not go away or does not get better with medicine. °? Chest pain. °? Difficulty breathing. °? Blurred vision or spots in your vision. °? Thoughts about hurting yourself or your baby. °· You develop pain in your abdomen or in one of your legs. °· You develop a severe headache. °· You faint. °· You bleed from your vagina so much that you fill two sanitary pads in one hour. °This information is not intended to replace advice given to you by your health care provider. Make sure you discuss any questions you have with your health care provider. °Document Released: 03/20/2000 Document Revised: 09/04/2015 Document Reviewed: 04/07/2015 °Elsevier Interactive Patient Education © 2019 [employer] ° °

## 2018-06-16 ENCOUNTER — Inpatient Hospital Stay (HOSPITAL_COMMUNITY): Payer: Medicaid Other

## 2018-06-16 ENCOUNTER — Encounter (HOSPITAL_COMMUNITY): Payer: Self-pay

## 2018-06-16 MED ORDER — METOCLOPRAMIDE HCL 10 MG PO TABS
10.0000 mg | ORAL_TABLET | Freq: Once | ORAL | Status: AC
  Administered 2018-06-17: 10 mg via ORAL
  Filled 2018-06-16: qty 1

## 2018-06-16 MED ORDER — FAMOTIDINE 20 MG PO TABS
40.0000 mg | ORAL_TABLET | Freq: Once | ORAL | Status: AC
  Administered 2018-06-17: 40 mg via ORAL
  Filled 2018-06-16: qty 2

## 2018-06-16 MED ORDER — LACTATED RINGERS IV SOLN: INTRAVENOUS | Status: DC

## 2018-06-16 MED ORDER — PNEUMOCOCCAL VAC POLYVALENT 25 MCG/0.5ML IJ INJ: 0.5000 mL | INJECTION | INTRAMUSCULAR | Status: DC

## 2018-06-16 NOTE — Anesthesia Postprocedure Evaluation (Signed)
Anesthesia Post Note  Patient: Lazaria Kudrick  Procedure(s) Performed: AN AD HOC LABOR EPIDURAL     Patient location during evaluation: Mother Baby Anesthesia Type: Epidural Level of consciousness: awake and alert and oriented Pain management: satisfactory to patient Vital Signs Assessment: post-procedure vital signs reviewed and stable Respiratory status: respiratory function stable Cardiovascular status: stable Postop Assessment: no headache, no backache, epidural receding, patient able to bend at knees, no signs of nausea or vomiting and adequate PO intake Anesthetic complications: no    Last Vitals:  Vitals:   06/15/18 2300 06/16/18 0330  BP: 111/72 116/69  Pulse: 70 72  Resp: 16 18  Temp: 36.8 C 36.6 C  SpO2: 99% 99%    Last Pain:  Vitals:   06/16/18 0330  TempSrc: Oral  PainSc: 7    Pain Goal:                   [employer]

## 2018-06-16 NOTE — Progress Notes (Signed)
Post Partum Day 1 Subjective: up ad lib, voiding, tolerating PO and + flatus. Patient is able to tolerate PO, but is nauseous when eating. She is able to tolerate ginger ale. Endorses vaginal bleeding. Has changed her pad 4 times, all saturated. No clots. She endorses pelvic cramping radiating to her low back, pain 8/10, and right upper and lower quadrant abdominal pain when rolling on right side. She is feeling "flushed."  Objective: Blood pressure 116/69, pulse 72, temperature 97.9 F (36.6 C), temperature source Oral, resp. rate 18, height 5\' 5"  (1.651 m), weight 80.7 kg, last menstrual period 09/16/2017, SpO2 99 %, unknown if currently breastfeeding.  Physical Exam:  General: alert, cooperative, appears stated age and no distress Lochia: appropriate Uterine Fundus: No appreciated on exam. Abdominal exam: Right upper and lower quadrant tenderness to palpation. DVT Evaluation: No evidence of DVT seen on physical exam. Negative Homan's sign. No cords or calf tenderness. No significant calf/ankle edema.  Recent Labs    06/15/18 0138  HGB 14.8  HCT 42.3    Assessment/Plan: Contraception with tubal ligation.  Per attending.   LOS: 1 day   Kara Stewart 06/16/2018, 10:32 AM

## 2018-06-17 ENCOUNTER — Inpatient Hospital Stay (HOSPITAL_COMMUNITY): Payer: Medicaid Other | Admitting: Certified Registered Nurse Anesthetist

## 2018-06-17 ENCOUNTER — Encounter (HOSPITAL_COMMUNITY)
Admission: AD | Disposition: A | Discharge status: Home / Self Care | Payer: Self-pay | Source: Home / Self Care | Attending: Obstetrics and Gynecology

## 2018-06-17 SURGERY — LIGATION, FALLOPIAN TUBE, POSTPARTUM
Anesthesia: Choice | Site: Abdomen | Wound class: Clean Contaminated

## 2018-06-17 MED ORDER — SENNOSIDES-DOCUSATE SODIUM 8.6-50 MG PO TABS: 2.0000 | ORAL_TABLET | ORAL | 0 refills | Status: DC

## 2018-06-17 MED ORDER — IBUPROFEN 600 MG PO TABS: 600.0000 mg | ORAL_TABLET | Freq: Four times a day (QID) | ORAL | 0 refills | Status: DC

## 2018-06-17 MED ORDER — BUPIVACAINE HCL (PF) 0.25 % IJ SOLN
INTRAMUSCULAR | Status: AC
  Filled 2018-06-17: qty 30

## 2018-06-17 NOTE — Progress Notes (Signed)
Patient ID: Kara Stewart, female   DOB: [birth year], 43 y.o.   MRN: [id number] 43 y.o. [id number] with undesired fertility,status post vaginal delivery, desires permanent sterilization. Risks and benefits of procedure discussed with patient including permanence of method, pain and possible menstrual problems, bleeding, infection, injury to surrounding organs and need for additional procedures. Risk failure of 0.5-1% with increased risk of ectopic gestation if pregnancy occurs was also discussed with patient. Patient verbalized understanding and all questions were answered.  Currie Paris Debroah Loop MD 06/17/2018 10:15 AM

## 2018-06-17 NOTE — Progress Notes (Addendum)
CSW made [place] CPS report for infant's positive UDS for THC. No barriers to discharge CPS to follow up with MOB within 72 hours.   Sterling Mondo Irwin, LCSWA  [facility] [phone]    

## 2018-06-17 NOTE — Anesthesia Preprocedure Evaluation (Signed)
Anesthesia Evaluation  Patient identified by MRN, date of birth, ID band Patient awake    Reviewed: Allergy & Precautions, NPO status , Patient's Chart, lab work & pertinent test results  Airway        Dental   Pulmonary asthma , Current Smoker,           Cardiovascular hypertension, + dysrhythmias Supra Ventricular Tachycardia   TTE 2014 EF 55-60%, no valvular abnormalities   Neuro/Psych PSYCHIATRIC DISORDERS Anxiety negative neurological ROS     GI/Hepatic hiatal hernia, GERD  ,(+) Cirrhosis     substance abuse  alcohol use,   Endo/Other  Hypothyroidism   Renal/GU negative Renal ROS  negative genitourinary   Musculoskeletal negative musculoskeletal ROS (+)   Abdominal   Peds negative pediatric ROS (+)  Hematology negative hematology ROS (+)   Anesthesia Other Findings   Reproductive/Obstetrics negative OB ROS                            Anesthesia Physical Anesthesia Plan  ASA: III  Anesthesia Plan:    Post-op Pain Management:    Induction: Intravenous  PONV Risk Score and Plan: 1 and Ondansetron, Dexamethasone and Midazolam  Airway Management Planned:   Additional Equipment:   Intra-op Plan:   Post-operative Plan:   Informed Consent: I have reviewed the patients History and Physical, chart, labs and discussed the procedure including the risks, benefits and alternatives for the proposed anesthesia with the patient or authorized representative who has indicated his/her understanding and acceptance.     Dental advisory given  Plan Discussed with: CRNA  Anesthesia Plan Comments:         Anesthesia Quick Evaluation
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
## 2018-06-27 ENCOUNTER — Encounter: Payer: Self-pay | Admitting: *Deleted

## 2018-07-14 ENCOUNTER — Ambulatory Visit: Payer: Self-pay | Admitting: Obstetrics & Gynecology

## 2018-07-19 ENCOUNTER — Telehealth: Payer: Self-pay | Admitting: Family Medicine

## 2018-07-19 NOTE — Telephone Encounter (Signed)
Called patient about her appointment. Her VM was full, and I was not able to leave a message.

## 2018-07-22 ENCOUNTER — Telehealth: Payer: Self-pay | Admitting: Obstetrics and Gynecology

## 2018-07-22 NOTE — Telephone Encounter (Signed)
Attempted to call patient to let her know that her appointment on 4/20 will be done virtually. No answer, left detailed message with this information and app information. Office number left if she has any questions.

## 2018-07-25 ENCOUNTER — Encounter: Payer: Self-pay | Admitting: Obstetrics and Gynecology

## 2018-07-25 ENCOUNTER — Ambulatory Visit (INDEPENDENT_AMBULATORY_CARE_PROVIDER_SITE_OTHER): Payer: Medicaid Other | Admitting: Obstetrics and Gynecology

## 2018-07-25 ENCOUNTER — Ambulatory Visit: Payer: Self-pay | Admitting: Obstetrics & Gynecology

## 2018-07-25 DIAGNOSIS — Z1389 Encounter for screening for other disorder: Secondary | ICD-10-CM

## 2018-07-25 NOTE — Progress Notes (Signed)
I connected with  Kara Stewart on 07/25/18 at 10:35 AM EDT by telephone and verified that I am speaking with the correct person using two identifiers.   I discussed the limitations, risks, security and privacy concerns of performing an evaluation and management service by telephone and the availability of in person appointments. I also discussed with the patient that there may be a patient responsible charge related to this service. The patient expressed understanding and agreed to proceed.  Kara Huh, RN 07/25/2018  10:23 AM  Subjective:     Kara Stewart is a 43 y.o. female who presents for a postpartum visit. She is 5 weeks postpartum following a spontaneous vaginal delivery. I have fully reviewed the prenatal and intrapartum course. The delivery was at [redacted]w[redacted]d gestational weeks. Outcome: spontaneous vaginal delivery. Anesthesia: epidural. Postpartum course has been complicated by some back and leg pain. Also reports unable to feel inside her vagina. Baby's course has been unremarkable. Baby is feeding by bottle. Bleeding thick, heavy lochia. Bowel function is normal. Bladder function is normal. Patient is sexually active. Contraception method is condoms. Postpartum depression screening: negative.  The following portions of the patient's history were reviewed and updated as appropriate: allergies, current medications, past family history, past medical history, past social history, past surgical history and problem list.  Review of Systems Pertinent items noted in HPI and remainder of comprehensive ROS otherwise negative.   Objective:            Assessment:      postpartum exam. Pap smear not done at today's visit.  Plan:    1. Contraception: condoms   Pt informed that she will be contacted to schedule her BTL once COVID 19 restrictions are lifted. Return to normal ADL's as tolerates. F/U as need if c/o back and leg pain do not resolved. Pt reassured during today's visit that  these Sx should resolve with time and most likely are not related to her epidural. Not certin about the decreased vaginal sensation. Again reassured pt and that should resolve with time. If above Sx do not resolve pt instructed that she would need a in person visit for further evaluation. She otherwise will f/u PRN or in 1 yr.  Pt had a televisit today as per [facility] guidelines. 15 minutes spent with today's visit

## 2018-08-01 NOTE — Progress Notes (Signed)
REVIEWED-NO ADDITIONAL RECOMMENDATIONS. 

## 2018-09-12 ENCOUNTER — Other Ambulatory Visit: Payer: Self-pay

## 2018-09-12 ENCOUNTER — Telehealth (INDEPENDENT_AMBULATORY_CARE_PROVIDER_SITE_OTHER): Payer: Medicaid Other | Admitting: Obstetrics and Gynecology

## 2018-09-12 ENCOUNTER — Encounter: Payer: Self-pay | Admitting: Obstetrics and Gynecology

## 2018-09-12 DIAGNOSIS — Z3009 Encounter for other general counseling and advice on contraception: Secondary | ICD-10-CM

## 2018-09-12 NOTE — Progress Notes (Signed)
Spoke with Drema Balzarine [facility]) told her I have not been able to reach pt today, did she have any other numbers. Jordan sent her an email to call us so we can do her pre-op and get her Covid tested before Wednesday.

## 2018-09-12 NOTE — Progress Notes (Signed)
TELEHEALTH VIRTUAL GYNECOLOGY VISIT ENCOUNTER NOTE  I connected with Kara Stewart on 09/12/18 at 10:15 AM EDT by telephone at home and verified that I am speaking with the correct person using two identifiers.   I discussed the limitations, risks, security and privacy concerns of performing an evaluation and management service by telephone and the availability of in person appointments. I also discussed with the patient that there may be a patient responsible charge related to this service. The patient expressed understanding and agreed to proceed.   History:  Kara Stewart is a 43 y.o. [id number] female being evaluated today for lap BTL. Unable to use hormonal contraception d/t to fatty liver. Currently using condoms.  BTL papers signed in 05/2018. Was unable to have PP BTL d/t COVIDS 19. She denies no other GYN concerns today      Past Medical History:  Diagnosis Date  . Acid reflux   . Alcohol abuse 07/21/2012  . 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 Alcoholic hepatitis without ascites   . Anxiety   . Asthma   . BV (bacterial vaginosis) 04/26/2015  . Colitis   . Dyspepsia 2004   Dx w/ PUD but no EGD, [place], [place]   . EBV infection 08/11/2012  . GERD (gastroesophageal reflux disease) 07/21/2012  . Hematemesis without nausea   . Hiatal hernia   . History of alcohol abuse 04/26/2015  . History of colitis 11/29/2014  . Hypertension   . Hypertriglyceridemia   . Hypothyroid   . Iritis    frequent  . Irregular periods 01/11/2014  . Mallory-Weiss tear   . Ovarian cyst   . Panic attacks   . RLQ abdominal pain 01/11/2014  . SVT (supraventricular tachycardia) (HCC)   . Thyroid nodule   . Trichomonal vaginitis 01/11/2014  . Vaginal discharge 01/11/2014  . Weight gain 01/11/2014   Past Surgical History:  Procedure Laterality Date  . BIOPSY N/A 02/05/2015   Procedure: BIOPSY;  Surgeon: West BaliSandi L Fields, MD;  Location: [facility];  Service: Endoscopy;  Laterality: N/A;  . COLONOSCOPY    . COLONOSCOPY  WITH PROPOFOL N/A 02/05/2015   UJW:JXBJYSLF:small HH/mild diverticulosis  . ESOPHAGEAL DILATION N/A 11/21/2015   Procedure: ESOPHAGEAL DILATION;  Surgeon: Corbin Adeobert M Rourk, MD;  Location: [facility];  Service: Endoscopy;  Laterality: N/A;  . ESOPHAGOGASTRODUODENOSCOPY  11/06/2011   [name]: MILD Esophagitis/PATENT ESOPHAGEAL Stricture/  Moderate gastritis. Bx no.hpylori or celiac, +gastritis  . ESOPHAGOGASTRODUODENOSCOPY (EGD) WITH PROPOFOL N/A 03/20/2014   [name]: 1. Mild esophagitis & distal esophagela stricture. 2. small hiatal hernia 3. moderate non-erosive gastritis and mild duodenits  . ESOPHAGOGASTRODUODENOSCOPY (EGD) WITH PROPOFOL N/A 11/21/2015   Dr. Jena Gaussourk: LA grade B esophagitis, MW tear likely source of hematemesis  . SAVORY DILATION N/A 03/20/2014   Procedure: SAVORY DILATION;  Surgeon: West BaliSandi L Fields, MD;  Location: [facility];  Service: Endoscopy;  Laterality: N/A;  dilated with # 12.8, 14,15,16  . TOOTH EXTRACTION     The following portions of the patient's history were reviewed and updated as appropriate: allergies, current medications, past family history, past medical history, past social history, past surgical history and problem list.     Review of Systems:  Pertinent items noted in HPI and remainder of comprehensive ROS otherwise negative.  Physical Exam:   General:  Alert, oriented and cooperative.   Mental Status: Normal mood and affect perceived. Normal judgment and thought content.  Physical exam deferred due to nature of the encounter  Labs and Imaging No results found  for this or any previous visit (from the past 336 hour(s)). No results found.    Assessment and Plan:     1. Unwanted fertility Patient desires bilateral tubal sterilization.  Other reversible forms of contraception were discussed with patient; she declines all other modalities. Discussed bilateral tubal sterilization in detail; discussed options of laparoscopic bilateral tubal sterilization using Filshie clips vs  laparoscopic bilateral salpingectomy. Risks and benefits discussed in detail including but not limited to: risk of regret, permanence of method, bleeding, infection, injury to surrounding organs and need for additional procedures.  Failure risk of 1-2 % for Filshie clips and <1% for bilateral salpingectomy with increased risk of ectopic gestation if pregnancy occurs was also discussed with patient.  Also discussed possible reduction of risk of ovarian cancer via bilateral salpingectomy given that a growing body of knowledge reveals that the majority of cases of high grade serous "ovarian" cancer actually are actually  cancers arising from the fimbriated end of the fallopian tubes. Emphasized that removal of fallopian tubes do not result in any known hormonal imbalance.  Patient verbalized understanding of these risks and benefits and wants to proceed with sterilization with laparoscopic bilateral sterilization using Filshie clips.    She was told that she will be contacted by our surgical scheduler regarding the time and date of her surgery; routine preoperative instructions of having nothing to eat or drink after midnight on the day prior to surgery and also coming to the hospital 1 1/2 hours prior to her time of surgery were also emphasized.  She was told she may be called for a preoperative appointment about a week prior to surgery and will be given further preoperative instructions at that visit.  Routine postoperative instructions will be reviewed with the patient and her family in detail after surgery. Printed patient education handouts about the procedure was given to the patient to review at home.  Medicaid papers have been signed , patient understands that surgery will be scheduled at least 30 days after the day papers are signed as per Medicaid guidelines.  In the meantime, patient will use condoms for contraception prior to surgery.         I discussed the assessment and treatment plan with  the patient. The patient was provided an opportunity to ask questions and all were answered. The patient agreed with the plan and demonstrated an understanding of the instructions.   The patient was advised to call back or seek an in-person evaluation/go to the ED if the symptoms worsen or if the condition fails to improve as anticipated.  I provided 15 minutes of non-face-to-face time during this encounter.   Chancy Milroy, MD [facility], [place]

## 2018-09-13 ENCOUNTER — Encounter (HOSPITAL_BASED_OUTPATIENT_CLINIC_OR_DEPARTMENT_OTHER): Payer: Self-pay | Admitting: *Deleted

## 2018-09-13 ENCOUNTER — Other Ambulatory Visit: Payer: Self-pay

## 2018-09-13 ENCOUNTER — Other Ambulatory Visit (HOSPITAL_COMMUNITY)
Admission: RE | Admit: 2018-09-13 | Discharge: 2018-09-13 | Disposition: A | Discharge status: Home / Self Care | Payer: Medicaid Other | Source: Ambulatory Visit | Attending: Obstetrics & Gynecology | Admitting: Obstetrics & Gynecology

## 2018-09-13 DIAGNOSIS — Z01812 Encounter for preprocedural laboratory examination: Secondary | ICD-10-CM | POA: Diagnosis not present

## 2018-09-13 DIAGNOSIS — Z1159 Encounter for screening for other viral diseases: Secondary | ICD-10-CM | POA: Diagnosis not present

## 2018-09-13 LAB — SARS CORONAVIRUS 2 BY RT PCR (HOSPITAL ORDER, PERFORMED IN ~~LOC~~ HOSPITAL LAB): SARS Coronavirus 2: NEGATIVE

## 2018-09-13 NOTE — Anesthesia Preprocedure Evaluation (Addendum)
Anesthesia Evaluation  Patient identified by MRN, date of birth, ID band Patient awake    Reviewed: Allergy & Precautions, NPO status , Patient's Chart, lab work & pertinent test results  Airway Mallampati: I  TM Distance: >3 FB Neck ROM: Full    Dental no notable dental hx. (+) Teeth Intact, Chipped,    Pulmonary asthma , Current Smoker,    Pulmonary exam normal breath sounds clear to auscultation       Cardiovascular hypertension, Pt. on medications Normal cardiovascular exam+ dysrhythmias Supra Ventricular Tachycardia  Rhythm:Regular Rate:Normal  TTE 2014 EF 55-60%, no valvular abnormalities   Neuro/Psych PSYCHIATRIC DISORDERS Anxiety negative neurological ROS     GI/Hepatic hiatal hernia, GERD  ,(+) Cirrhosis     substance abuse  alcohol use,   Endo/Other  Hypothyroidism   Renal/GU negative Renal ROS  negative genitourinary   Musculoskeletal negative musculoskeletal ROS (+)   Abdominal   Peds negative pediatric ROS (+)  Hematology negative hematology ROS (+)   Anesthesia Other Findings   Reproductive/Obstetrics negative OB ROS                            Anesthesia Physical  Anesthesia Plan  ASA: III  Anesthesia Plan:    Post-op Pain Management:    Induction: Intravenous  PONV Risk Score and Plan: 1 and Ondansetron, Dexamethasone and Midazolam  Airway Management Planned:   Additional Equipment:   Intra-op Plan:   Post-operative Plan:   Informed Consent: I have reviewed the patients History and Physical, chart, labs and discussed the procedure including the risks, benefits and alternatives for the proposed anesthesia with the patient or authorized representative who has indicated his/her understanding and acceptance.     Dental advisory given  Plan Discussed with: CRNA  Anesthesia Plan Comments:         Anesthesia Quick Evaluation

## 2018-09-14 ENCOUNTER — Ambulatory Visit (HOSPITAL_BASED_OUTPATIENT_CLINIC_OR_DEPARTMENT_OTHER)
Admission: RE | Admit: 2018-09-14 | Discharge: 2018-09-14 | Disposition: A | Discharge status: Home / Self Care | Payer: Medicaid Other | Attending: Obstetrics & Gynecology | Admitting: Obstetrics & Gynecology

## 2018-09-14 ENCOUNTER — Ambulatory Visit (HOSPITAL_BASED_OUTPATIENT_CLINIC_OR_DEPARTMENT_OTHER): Payer: Medicaid Other | Admitting: Anesthesiology

## 2018-09-14 ENCOUNTER — Encounter (HOSPITAL_BASED_OUTPATIENT_CLINIC_OR_DEPARTMENT_OTHER)
Admission: RE | Disposition: A | Discharge status: Home / Self Care | Payer: Self-pay | Source: Home / Self Care | Attending: Obstetrics & Gynecology

## 2018-09-14 ENCOUNTER — Other Ambulatory Visit: Payer: Self-pay

## 2018-09-14 ENCOUNTER — Encounter (HOSPITAL_BASED_OUTPATIENT_CLINIC_OR_DEPARTMENT_OTHER): Payer: Self-pay | Admitting: *Deleted

## 2018-09-14 DIAGNOSIS — Z888 Allergy status to other drugs, medicaments and biological substances status: Secondary | ICD-10-CM | POA: Diagnosis not present

## 2018-09-14 DIAGNOSIS — J45909 Unspecified asthma, uncomplicated: Secondary | ICD-10-CM | POA: Diagnosis not present

## 2018-09-14 DIAGNOSIS — I10 Essential (primary) hypertension: Secondary | ICD-10-CM | POA: Insufficient documentation

## 2018-09-14 DIAGNOSIS — Z79899 Other long term (current) drug therapy: Secondary | ICD-10-CM | POA: Insufficient documentation

## 2018-09-14 DIAGNOSIS — Z302 Encounter for sterilization: Secondary | ICD-10-CM | POA: Insufficient documentation

## 2018-09-14 DIAGNOSIS — Z880 Allergy status to penicillin: Secondary | ICD-10-CM | POA: Insufficient documentation

## 2018-09-14 DIAGNOSIS — F17210 Nicotine dependence, cigarettes, uncomplicated: Secondary | ICD-10-CM | POA: Insufficient documentation

## 2018-09-14 HISTORY — PX: LAPAROSCOPIC TUBAL LIGATION: SHX1937

## 2018-09-14 LAB — POCT PREGNANCY, URINE: Preg Test, Ur: NEGATIVE

## 2018-09-14 SURGERY — LIGATION, FALLOPIAN TUBE, LAPAROSCOPIC
Anesthesia: General | Site: Abdomen | Laterality: Bilateral

## 2018-09-14 MED ORDER — SCOPOLAMINE 1 MG/3DAYS TD PT72
MEDICATED_PATCH | TRANSDERMAL | Status: AC
  Filled 2018-09-14: qty 1

## 2018-09-14 MED ORDER — DEXAMETHASONE SODIUM PHOSPHATE 10 MG/ML IJ SOLN
INTRAMUSCULAR | Status: AC
  Filled 2018-09-14: qty 1

## 2018-09-14 MED ORDER — KETOROLAC TROMETHAMINE 30 MG/ML IJ SOLN
INTRAMUSCULAR | Status: DC | PRN
  Administered 2018-09-14: 30 mg via INTRAVENOUS

## 2018-09-14 MED ORDER — FENTANYL CITRATE (PF) 100 MCG/2ML IJ SOLN: 50.0000 ug | INTRAMUSCULAR | Status: DC | PRN

## 2018-09-14 MED ORDER — DEXMEDETOMIDINE HCL IN NACL 200 MCG/50ML IV SOLN
40.0000 ug | Freq: Once | INTRAVENOUS | Status: AC
  Administered 2018-09-14: 11:00:00 40 ug via INTRAVENOUS

## 2018-09-14 MED ORDER — FENTANYL CITRATE (PF) 100 MCG/2ML IJ SOLN
INTRAMUSCULAR | Status: AC
  Filled 2018-09-14: qty 2

## 2018-09-14 MED ORDER — MIDAZOLAM HCL 2 MG/2ML IJ SOLN
INTRAMUSCULAR | Status: AC
  Filled 2018-09-14: qty 2

## 2018-09-14 MED ORDER — PROPOFOL 10 MG/ML IV BOLUS
INTRAVENOUS | Status: DC | PRN
  Administered 2018-09-14 (×3): 20 mg via INTRAVENOUS
  Administered 2018-09-14: 180 mg via INTRAVENOUS
  Administered 2018-09-14: 20 mg via INTRAVENOUS

## 2018-09-14 MED ORDER — MEPERIDINE HCL 25 MG/ML IJ SOLN: 6.2500 mg | INTRAMUSCULAR | Status: DC | PRN

## 2018-09-14 MED ORDER — BUPIVACAINE HCL (PF) 0.5 % IJ SOLN
INTRAMUSCULAR | Status: DC | PRN
  Administered 2018-09-14: 10 mL

## 2018-09-14 MED ORDER — ACETAMINOPHEN 160 MG/5ML PO SOLN: 325.0000 mg | ORAL | Status: DC | PRN

## 2018-09-14 MED ORDER — SUGAMMADEX SODIUM 200 MG/2ML IV SOLN
INTRAVENOUS | Status: DC | PRN
  Administered 2018-09-14: 150 mg via INTRAVENOUS

## 2018-09-14 MED ORDER — PROMETHAZINE HCL 25 MG/ML IJ SOLN
INTRAMUSCULAR | Status: AC
  Filled 2018-09-14: qty 1

## 2018-09-14 MED ORDER — PROMETHAZINE HCL 25 MG/ML IJ SOLN
6.2500 mg | Freq: Once | INTRAMUSCULAR | Status: AC
  Administered 2018-09-14: 10:00:00 6.25 mg via INTRAVENOUS

## 2018-09-14 MED ORDER — PROPOFOL 500 MG/50ML IV EMUL
INTRAVENOUS | Status: AC
  Filled 2018-09-14: qty 50

## 2018-09-14 MED ORDER — OXYCODONE HCL 5 MG PO TABS: 5.0000 mg | ORAL_TABLET | Freq: Once | ORAL | Status: DC | PRN

## 2018-09-14 MED ORDER — DEXAMETHASONE SODIUM PHOSPHATE 10 MG/ML IJ SOLN
INTRAMUSCULAR | Status: DC | PRN
  Administered 2018-09-14: 5 mg via INTRAVENOUS

## 2018-09-14 MED ORDER — FENTANYL CITRATE (PF) 100 MCG/2ML IJ SOLN
INTRAMUSCULAR | Status: DC | PRN
  Administered 2018-09-14: 25 ug via INTRAVENOUS
  Administered 2018-09-14: 100 ug via INTRAVENOUS
  Administered 2018-09-14: 50 ug via INTRAVENOUS
  Administered 2018-09-14: 25 ug via INTRAVENOUS

## 2018-09-14 MED ORDER — IBUPROFEN 600 MG PO TABS: 600.0000 mg | ORAL_TABLET | Freq: Four times a day (QID) | ORAL | 1 refills | Status: DC | PRN

## 2018-09-14 MED ORDER — OXYCODONE HCL 5 MG PO TABS: 5.0000 mg | ORAL_TABLET | ORAL | 0 refills | Status: DC | PRN

## 2018-09-14 MED ORDER — KETOROLAC TROMETHAMINE 30 MG/ML IJ SOLN
INTRAMUSCULAR | Status: AC
  Filled 2018-09-14: qty 1

## 2018-09-14 MED ORDER — SCOPOLAMINE 1 MG/3DAYS TD PT72
1.0000 | MEDICATED_PATCH | Freq: Once | TRANSDERMAL | Status: DC | PRN
  Administered 2018-09-14: 13:00:00 1.5 mg via TRANSDERMAL

## 2018-09-14 MED ORDER — DEXMEDETOMIDINE HCL 200 MCG/2ML IV SOLN: 40.0000 ug | Freq: Once | INTRAVENOUS | Status: DC

## 2018-09-14 MED ORDER — LIDOCAINE 2% (20 MG/ML) 5 ML SYRINGE
INTRAMUSCULAR | Status: AC
  Filled 2018-09-14: qty 5

## 2018-09-14 MED ORDER — ONDANSETRON HCL 4 MG/2ML IJ SOLN
INTRAMUSCULAR | Status: AC
  Filled 2018-09-14: qty 2

## 2018-09-14 MED ORDER — LIDOCAINE HCL (CARDIAC) PF 100 MG/5ML IV SOSY
PREFILLED_SYRINGE | INTRAVENOUS | Status: DC | PRN
  Administered 2018-09-14: 80 mg via INTRAVENOUS

## 2018-09-14 MED ORDER — ONDANSETRON HCL 4 MG/2ML IJ SOLN: 4.0000 mg | Freq: Once | INTRAMUSCULAR | Status: DC | PRN

## 2018-09-14 MED ORDER — MIDAZOLAM HCL 2 MG/2ML IJ SOLN: 1.0000 mg | INTRAMUSCULAR | Status: DC | PRN

## 2018-09-14 MED ORDER — ACETAMINOPHEN 325 MG PO TABS: 325.0000 mg | ORAL_TABLET | ORAL | Status: DC | PRN

## 2018-09-14 MED ORDER — OXYCODONE HCL 5 MG/5ML PO SOLN: 5.0000 mg | Freq: Once | ORAL | Status: DC | PRN

## 2018-09-14 MED ORDER — MIDAZOLAM HCL 2 MG/2ML IJ SOLN
INTRAMUSCULAR | Status: DC | PRN
  Administered 2018-09-14: 2 mg via INTRAVENOUS

## 2018-09-14 MED ORDER — ONDANSETRON HCL 4 MG/2ML IJ SOLN
INTRAMUSCULAR | Status: DC | PRN
  Administered 2018-09-14: 4 mg via INTRAVENOUS

## 2018-09-14 MED ORDER — ROCURONIUM BROMIDE 100 MG/10ML IV SOLN
INTRAVENOUS | Status: DC | PRN
  Administered 2018-09-14: 30 mg via INTRAVENOUS

## 2018-09-14 MED ORDER — LACTATED RINGERS IV SOLN
INTRAVENOUS | Status: DC
  Administered 2018-09-14 (×3): via INTRAVENOUS

## 2018-09-14 MED ORDER — ROCURONIUM BROMIDE 10 MG/ML (PF) SYRINGE
PREFILLED_SYRINGE | INTRAVENOUS | Status: AC
  Filled 2018-09-14: qty 10

## 2018-09-14 MED ORDER — BUPIVACAINE HCL (PF) 0.5 % IJ SOLN
INTRAMUSCULAR | Status: AC
  Filled 2018-09-14: qty 30

## 2018-09-14 MED ORDER — FENTANYL CITRATE (PF) 100 MCG/2ML IJ SOLN
25.0000 ug | INTRAMUSCULAR | Status: DC | PRN
  Administered 2018-09-14 (×3): 50 ug via INTRAVENOUS

## 2018-09-14 MED ORDER — SUGAMMADEX SODIUM 200 MG/2ML IV SOLN
INTRAVENOUS | Status: AC
  Filled 2018-09-14: qty 2

## 2018-09-14 SURGICAL SUPPLY — 33 items
BANDAGE ADH SHEER 1  50/CT (GAUZE/BANDAGES/DRESSINGS) IMPLANT
BRIEF STRETCH FOR OB PAD XXL (UNDERPADS AND DIAPERS) ×2 IMPLANT
CLIP FILSHIE TUBAL LIGA STRL (Clip) ×1 IMPLANT
DRSG OPSITE POSTOP 3X4 (GAUZE/BANDAGES/DRESSINGS) ×1 IMPLANT
DURAPREP 26ML APPLICATOR (WOUND CARE) ×1 IMPLANT
ELECT REM PT RETURN 9FT ADLT (ELECTROSURGICAL) ×2
ELECTRODE REM PT RTRN 9FT ADLT (ELECTROSURGICAL) IMPLANT
GLOVE BIO SURGEON STRL SZ 6.5 (GLOVE) ×2 IMPLANT
GLOVE BIOGEL PI IND STRL 7.0 (GLOVE) ×2 IMPLANT
GLOVE BIOGEL PI INDICATOR 7.0 (GLOVE) ×2
GOWN STRL REUS W/ TWL XL LVL3 (GOWN DISPOSABLE) ×1 IMPLANT
GOWN STRL REUS W/TWL LRG LVL3 (GOWN DISPOSABLE) ×2 IMPLANT
GOWN STRL REUS W/TWL XL LVL3 (GOWN DISPOSABLE) ×2
NEEDLE INSUFFLATION 120MM (ENDOMECHANICALS) ×2 IMPLANT
NS IRRIG 1000ML POUR BTL (IV SOLUTION) ×2 IMPLANT
PACK LAPAROSCOPY BASIN (CUSTOM PROCEDURE TRAY) ×2 IMPLANT
PACK TRENDGUARD 450 HYBRID PRO (MISCELLANEOUS) IMPLANT
PACK TRENDGUARD 600 HYBRD PROC (MISCELLANEOUS) IMPLANT
PAD OB MATERNITY 4.3X12.25 (PERSONAL CARE ITEMS) ×2 IMPLANT
PAD PREP 24X48 CUFFED NSTRL (MISCELLANEOUS) ×2 IMPLANT
PENCIL BUTTON HOLSTER BLD 10FT (ELECTRODE) ×1 IMPLANT
SET TUBE SMOKE EVAC HIGH FLOW (TUBING) ×2 IMPLANT
SHEARS HARMONIC ACE PLUS 36CM (ENDOMECHANICALS) IMPLANT
SLEEVE SCD COMPRESS KNEE MED (MISCELLANEOUS) ×2 IMPLANT
SLEEVE XCEL OPT CAN 5 100 (ENDOMECHANICALS) IMPLANT
SUT VICRYL 0 UR6 27IN ABS (SUTURE) ×3 IMPLANT
SUT VICRYL 4-0 PS2 18IN ABS (SUTURE) ×4 IMPLANT
SYSTEM CARTER THOMASON II (TROCAR) IMPLANT
TOWEL GREEN STERILE FF (TOWEL DISPOSABLE) ×4 IMPLANT
TRENDGUARD 450 HYBRID PRO PACK (MISCELLANEOUS) ×2
TRENDGUARD 600 HYBRID PROC PK (MISCELLANEOUS)
TROCAR OPTI TIP 5M 100M (ENDOMECHANICALS) IMPLANT
TROCAR XCEL DIL TIP R 11M (ENDOMECHANICALS) ×2 IMPLANT

## 2018-09-14 NOTE — Transfer of Care (Signed)
Immediate Anesthesia Transfer of Care Note  Patient: Kara Stewart  Procedure(s) Performed: LAPAROSCOPIC TUBAL LIGATION (Bilateral Abdomen)  Patient Location: PACU  Anesthesia Type:General  Level of Consciousness: awake, alert , oriented and patient cooperative  Airway & Oxygen Therapy: Patient Spontanous Breathing and Patient connected to nasal cannula oxygen  Post-op Assessment: Report given to RN and Post -op Vital signs reviewed and stable  Post vital signs: Reviewed and stable  Last Vitals:  Vitals Value Taken Time  BP 117/68 09/14/2018  9:35 AM  Temp    Pulse 106 09/14/2018  9:35 AM  Resp 24 09/14/2018  9:35 AM  SpO2 100 % 09/14/2018  9:35 AM  Vitals shown include unvalidated device data.  Last Pain:  Vitals:   09/14/18 0752  TempSrc: Oral  PainSc: 4       Patients Stated Pain Goal: 2 (31/43/88 8757)  Complications: No apparent anesthesia complications

## 2018-09-14 NOTE — Discharge Instructions (Signed)
Laparoscopic Tubal Ligation, Care After Refer to this sheet in the next few weeks. These instructions provide you with information about caring for yourself after your procedure. Your health care provider may also give you more specific instructions. Your treatment has been planned according to current medical practices, but problems sometimes occur. Call your health care provider if you have any problems or questions after your procedure. Next dose Ibuprofen at 3 PM What can I expect after the procedure? After the procedure, it is common to have:  A sore throat.  Discomfort in your shoulder.  Mild discomfort or cramping in your abdomen.  Gas pains.  Pain or soreness in the area where the surgical cut (incision) was made.  A bloated feeling.  Tiredness.  Nausea.  Vomiting. Follow these instructions at home: Medicines  Take over-the-counter and prescription medicines only as told by your health care provider.  Do not take aspirin because it can cause bleeding.  Do not drive or operate heavy machinery while taking prescription pain medicine. Activity  Rest for the rest of the day.  Return to your normal activities as told by your health care provider. Ask your health care provider what activities are safe for you. Incision care     Follow instructions from your health care provider about how to take care of your incision. Make sure you: ? Wash your hands with soap and water before you change your bandage (dressing). If soap and water are not available, use hand sanitizer. ? Change your dressing as told by your health care provider. ? Leave stitches (sutures) in place. They may need to stay in place for 2 weeks or longer.  Check your incision area every day for signs of infection. Check for: ? More redness, swelling, or pain. ? More fluid or blood. ? Warmth. ? Pus or a bad smell. Other Instructions  Do not take baths, swim, or use a hot tub until your health care  provider approves. You may take showers.  Keep all follow-up visits as told by your health care provider. This is important.  Have someone help you with your daily household tasks for the first few days. Contact a health care provider if:  You have more redness, swelling, or pain around your incision.  Your incision feels warm to the touch.  You have pus or a bad smell coming from your incision.  The edges of your incision break open after the sutures have been removed.  Your pain does not improve after 2-3 days.  You have a rash.  You repeatedly become dizzy or light-headed.  Your pain medicine is not helping.  You are constipated. Get help right away if:  You have a fever.  You faint.  You have increasing pain in your abdomen.  You have severe pain in one or both of your shoulders.  You have fluid or blood coming from your sutures or from your vagina.  You have shortness of breath or difficulty breathing.  You have chest pain or leg pain.  You have ongoing nausea, vomiting, or diarrhea. This information is not intended to replace advice given to you by your health care provider. Make sure you discuss any questions you have with your health care provider. Document Released: 10/10/2004 Document Revised: 11/17/2016 Document Reviewed: 03/03/2015 Elsevier Interactive Patient Education  2019 [employer]    Post Anesthesia Home Care Instructions  Activity: Get plenty of rest for the remainder of the day. A responsible individual must stay with you for  24 hours following the procedure.  For the next 24 hours, DO NOT: -Drive a car -Paediatric nurse -Drink alcoholic beverages -Take any medication unless instructed by your physician -Make any legal decisions or sign important papers.  Meals: Start with liquid foods such as gelatin or soup. Progress to regular foods as tolerated. Avoid greasy, spicy, heavy foods. If nausea and/or vomiting occur, drink only clear  liquids until the nausea and/or vomiting subsides. Call your physician if vomiting continues.  Special Instructions/Symptoms: Your throat may feel dry or sore from the anesthesia or the breathing tube placed in your throat during surgery. If this causes discomfort, gargle with warm salt water. The discomfort should disappear within 24 hours.  If you had a scopolamine patch placed behind your ear for the management of post- operative nausea and/or vomiting:  1. The medication in the patch is effective for 72 hours, after which it should be removed.  Wrap patch in a tissue and discard in the trash. Wash hands thoroughly with soap and water. 2. You may remove the patch earlier than 72 hours if you experience unpleasant side effects which may include dry mouth, dizziness or visual disturbances. 3. Avoid touching the patch. Wash your hands with soap and water after contact with the patch.

## 2018-09-14 NOTE — Op Note (Signed)
09/14/2018  9:34 AM  PATIENT:  Kara Stewart  43 y.o. female  PRE-OPERATIVE DIAGNOSIS:  Undesired Fertility  POST-OPERATIVE DIAGNOSIS:  Undesired Fertility  PROCEDURE:  Procedure(s): LAPAROSCOPIC TUBAL LIGATION (Bilateral)  SURGEON:  Surgeon(s) and Role:    * Alphonsus Doyel C, MD - Primary  ANESTHESIA:   local and general  EBL:  20 mL   BLOOD ADMINISTERED:none  DRAINS: none   LOCAL MEDICATIONS USED:  MARCAINE     SPECIMEN:  No Specimen  DISPOSITION OF SPECIMEN:  N/A  COUNTS:  YES  TOURNIQUET:  * No tourniquets in log *  DICTATION: .Dragon Dictation  PLAN OF CARE: Discharge to home after PACU  PATIENT DISPOSITION:  PACU - hemodynamically stable.   Delay start of Pharmacological VTE agent (>24hrs) due to surgical blood loss or risk of bleeding: yes   The risks, benefits, alternatives of surgery were explained understood, accepted. All questions were answered. She understands the failure rate of 1/200. She declines alternative forms of birth control. Her urine pregnancy test was negative. She was taken to the operating room and general anesthesia was applied without complication. She was placed in dorsal lithotomy position. Her abdomen and vagina were prepped and draped in the usual sterile fashion. A time out procedure was done. A bimanual exam revealed a normal, size and shape mobile uterus with nonenlarged adnexa. A Hulka manipulator was placed on the cervix. Gloves were changed and attention was turned to the abdomen. Approximately 10 mL of 0.5% Marcaine was used to infiltrate the subcutaneous tissue at the umbilicus. A vertical 1 cm incision was made. A Veres needle was placed in the pelvis. Low-flow CO2 was used to insufflate the abdomen to approximately 3 L. After good pneumoperitoneum was established, a 11 mm trocar was placed. Laparoscopy confirmed correct placement. She was placed in the Trendelenburg position. Patient abdominal pressure was always less than 15. Her  uterus ovaries, tubes, and liver appeared normal. There was no evidence of endometriosis. The oviducts were visually traced to their fimbriated end on each side. In the isthmic region of each oviduct, a Filshie clip was placed across the entire oviduct. There was no bleeding. The CO2 was allowed to escape from her abdomen. The umbilical fascia was closed with a figure of 0 Vicryl suture after elevating it with Kocker clamps. The subcuticular fat was deep.  No defects were palpable. There was bleeding from the subq fat, so I used a Bovie to achieve hemostasis. I closed this deep layer with a figure of 8 0 vicryl suture.  The subcuticular closure was done with 4-0 Vicryl suture. The Hulka manipulator was removed. She was extubated and taken to recovery in stable condition. She tolerated the procedure well. The instrument, sponge, and needle counts were correct.

## 2018-09-14 NOTE — H&P (Signed)
Kara FleetStephanie Stewart is an 43 y.o. divorced P361 (643 month old daughter) here for unwanted fertility. She wants a BTL for permanent sterility.  Menstrual History: Menarche age: 89 Her periods are monthly, lasting 7 days, heavy  Patient's last menstrual period was 09/12/2018 (exact date).    Past Medical History:  Diagnosis Date  . Acid reflux   . 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 Alcoholic hepatitis without ascites   . Anxiety   . Asthma   . Colitis   . GERD (gastroesophageal reflux disease) 07/21/2012  . Hiatal hernia   . History of alcohol abuse 04/26/2015  . Hypertriglyceridemia   . Hypothyroid   . Iritis    frequent  . Ovarian cyst   . Panic attacks   . SVT (supraventricular tachycardia) (HCC)    last issue 12/2017    Past Surgical History:  Procedure Laterality Date  . BIOPSY N/A 02/05/2015   Procedure: BIOPSY;  Surgeon: West BaliSandi L Fields, MD;  Location: [facility];  Service: Endoscopy;  Laterality: N/A;  . COLONOSCOPY    . COLONOSCOPY WITH PROPOFOL N/A 02/05/2015   AVW:UJWJXSLF:small HH/mild diverticulosis  . ESOPHAGEAL DILATION N/A 11/21/2015   Procedure: ESOPHAGEAL DILATION;  Surgeon: Corbin Adeobert M Rourk, MD;  [facility];  Service: Endoscopy;  Laterality: N/A;  . ESOPHAGOGASTRODUODENOSCOPY  11/06/2011   [name]: MILD Esophagitis/PATENT ESOPHAGEAL Stricture/  Moderate gastritis. Bx no.hpylori or celiac, +gastritis  . ESOPHAGOGASTRODUODENOSCOPY (EGD) WITH PROPOFOL N/A 03/20/2014   [name]: 1. Mild esophagitis & distal esophagela stricture. 2. small hiatal hernia 3. moderate non-erosive gastritis and mild duodenits  . ESOPHAGOGASTRODUODENOSCOPY (EGD) WITH PROPOFOL N/A 11/21/2015   Dr. Jena Gaussourk: LA grade B esophagitis, MW tear likely source of hematemesis  . SAVORY DILATION N/A 03/20/2014   Procedure: SAVORY DILATION;  Surgeon: West BaliSandi L Fields, MD;  Location: [facility];  Service: Endoscopy;  Laterality: N/A;  dilated with # 12.8, 14,15,16  . TOOTH EXTRACTION      Family History  Problem Relation Age of  Onset  . Stomach cancer Paternal Grandfather        colon cancer  . Cancer Paternal Grandfather        throat and esophagus  . Breast cancer Maternal Grandmother   . Cancer Maternal Grandmother        skin  . Anxiety disorder Maternal Grandmother   . Hypertension Mother   . Other Mother        fatty liver  . Hyperlipidemia Mother   . Liver disease Mother        fatty liver, does not drink.   . Other Father        varicose veins; stomach issues; hernia  . Hypertension Father   . Hyperlipidemia Father   . Cancer Father        prostate  . Arthritis Father        rheumatoid  . Neuropathy Father   . Thyroid disease Sister   . Hypertension Sister   . Other Brother        hernia  . Diabetes Maternal Grandfather   . Heart disease Maternal Grandfather   . Other Paternal Grandmother        hernia  . COPD Paternal Grandmother   . Diabetes Paternal Grandmother     Social History:  reports that she has been smoking cigarettes. She has never used smokeless tobacco. She reports that she does not drink alcohol or use drugs.  Allergies:  Allergies  Allergen Reactions  . Penicillins Anaphylaxis  Has patient had a PCN reaction causing immediate rash, facial/tongue/throat swelling, SOB or lightheadedness with hypotension: yes Has patient had a PCN reaction causing severe rash involving mucus membranes or skin necrosis: No Has patient had a PCN reaction that required hospitalization Yes Has patient had a PCN reaction occurring within the last 10 years: No If all of the above answers are "NO", then may proceed with Cephalosporin use.   . Lidocaine Other (See Comments)    Can use topical lidocaine but if ingested causes Hallucinations.    Medications Prior to Admission  Medication Sig Dispense Refill Last Dose  . albuterol (PROVENTIL HFA;VENTOLIN HFA) 108 (90 Base) MCG/ACT inhaler Inhale 2 puffs into the lungs every 6 (six) hours as needed for wheezing or shortness of breath. 1  Inhaler 0 Past Week at Unknown time  . Prenatal Vit-Fe Fumarate-FA (PRENATAL MULTIVITAMIN) TABS tablet Take 1 tablet by mouth daily at 12 noon.   09/13/2018 at Unknown time  . ursodiol (ACTIGALL) 500 MG tablet Take 500 mg by mouth 2 (two) times a day.   09/13/2018 at Unknown time    ROS  Monogamous for 3 years, live together homemaker  Blood pressure 120/63, pulse 73, temperature 98.6 F (37 C), temperature source Oral, resp. rate 18, height 5\' 5"  (1.651 m), weight 75.8 kg, last menstrual period 09/12/2018, SpO2 100 %, not currently breastfeeding. Physical Exam  Breathing, conversing, and ambulating normally Well nourished, well hydrated White female, no apparent distress Heart- rrr Lungs- CTAB Abd- benign  Results for orders placed or performed during the hospital encounter of 09/14/18 (from the past 24 hour(s))  Pregnancy, urine POC     Status: None   Collection Time: 09/14/18  7:44 AM  Result Value Ref Range   Preg Test, Ur NEGATIVE NEGATIVE    No results found.  Assessment/Plan: Unwanted fertility Plan for laparoscopic application of Filsche clips  She understands the risks of surgery, including, but not to infection, bleeding, DVTs, damage to bowel, bladder, ureters. She wishes to proceed.     Weylyn Ricciuti C Renne Cornick 09/14/2018, 8:06 AM

## 2018-09-14 NOTE — Anesthesia Procedure Notes (Signed)
Procedure Name: Intubation Date/Time: 09/14/2018 8:31 AM Performed by: Raenette Rover, CRNA Pre-anesthesia Checklist: Patient identified, Emergency Drugs available, Suction available and Patient being monitored Patient Re-evaluated:Patient Re-evaluated prior to induction Oxygen Delivery Method: Circle system utilized Preoxygenation: Pre-oxygenation with 100% oxygen Induction Type: IV induction and Rapid sequence Laryngoscope Size: Mac and 3 Grade View: Grade I Tube type: Oral Tube size: 7.0 mm Number of attempts: 1 Airway Equipment and Method: Stylet Placement Confirmation: ETT inserted through vocal cords under direct vision,  positive ETCO2,  CO2 detector and breath sounds checked- equal and bilateral Secured at: 22 cm Tube secured with: Tape Dental Injury: Teeth and Oropharynx as per pre-operative assessment

## 2018-09-14 NOTE — Anesthesia Postprocedure Evaluation (Signed)
Anesthesia Post Note  Patient: Kara Stewart  Procedure(s) Performed: LAPAROSCOPIC TUBAL LIGATION (Bilateral Abdomen)     Patient location during evaluation: PACU Anesthesia Type: General Level of consciousness: awake and alert Pain management: pain level controlled Vital Signs Assessment: post-procedure vital signs reviewed and stable Respiratory status: spontaneous breathing, nonlabored ventilation, respiratory function stable and patient connected to nasal cannula oxygen Cardiovascular status: blood pressure returned to baseline and stable Postop Assessment: no apparent nausea or vomiting Anesthetic complications: no    Last Vitals:  Vitals:   09/14/18 1145 09/14/18 1245  BP: 120/77 139/81  Pulse: 62 65  Resp: 20 18  Temp:  36.9 C  SpO2: 98% 100%    Last Pain:  Vitals:   09/14/18 1245  TempSrc:   PainSc: 5                  Lovette Merta

## 2018-09-15 ENCOUNTER — Encounter (HOSPITAL_BASED_OUTPATIENT_CLINIC_OR_DEPARTMENT_OTHER): Payer: Self-pay | Admitting: Obstetrics & Gynecology

## 2018-10-10 ENCOUNTER — Ambulatory Visit: Payer: Medicaid Other | Admitting: Gastroenterology

## 2018-10-10 ENCOUNTER — Encounter: Payer: Self-pay | Admitting: Gastroenterology

## 2018-10-10 ENCOUNTER — Other Ambulatory Visit: Payer: Self-pay

## 2018-10-10 VITALS — BP 116/71 | HR 77 | Temp 98.1°F | Ht 64.5 in | Wt 168.8 lb

## 2018-10-10 DIAGNOSIS — K743 Primary biliary cirrhosis: Secondary | ICD-10-CM | POA: Diagnosis not present

## 2018-10-10 DIAGNOSIS — R1011 Right upper quadrant pain: Secondary | ICD-10-CM | POA: Diagnosis not present

## 2018-10-10 DIAGNOSIS — R945 Abnormal results of liver function studies: Secondary | ICD-10-CM

## 2018-10-10 DIAGNOSIS — K769 Liver disease, unspecified: Secondary | ICD-10-CM

## 2018-10-10 DIAGNOSIS — R7989 Other specified abnormal findings of blood chemistry: Secondary | ICD-10-CM

## 2018-10-10 NOTE — Patient Instructions (Signed)
PA for [place] abd complete submitted via [employer]. Case approved. PA# [id number], valid 10/10/18-04/08/19

## 2018-10-10 NOTE — Patient Instructions (Signed)
1. Urso really does need to be taken WITH FOOD! Continue twice daily with at least a small snack and try your best to get that second dose in. 2. Please have labs and ultrasound done as planned. 3. We will call you with results as available and plan to see you again in six months.

## 2018-10-10 NOTE — Assessment & Plan Note (Signed)
43 year old female with history of alcoholic hepatitis complicated by ascites requiring paracentesis in 2018, positive AMA on empiric Urso who presents for follow-up.  She delivered a healthy baby girl back in March.  No complications during pregnancy.  Alkaline phosphatase slightly elevated at 163 in March.  Other LFTs normal except for hypoalbuminemia which is likely related to her pregnancy.  No alcohol use since March 2018.  No desires to go back to drinking.  She is done remarkably well with regards to her sobriety.  She complains of recurrent right mid right upper quadrant pain, increasing fatigue and admits to missing her site 50% of the time in the evenings.  With recent pregnancy, tubal ligation in June be difficult to sort out etiology of her symptoms.  We will update labs.  We will get an abdominal ultrasound to evaluate liver, for right upper quadrant pain and abnormal LFTs, to reassess her spleen and essentially rule out advanced liver disease.  Last ultrasound liver imaging over 2 years ago.  Come back to the office in 6 months.  In the meantime she continue Urso BID.  Discussed antireflux measures and she is not interested in pursuing PPI therapy at this time.  Encouraged her to continue sobriety and strive to get back to her prepregnancy weight.

## 2018-10-10 NOTE — Progress Notes (Signed)
Primary Care Physician: Aliene BeamsHagler, Rachel, MD  Primary Gastroenterologist:  Jonette EvaSandi Fields, MD   Chief Complaint  Patient presents with  . Hepatic Disease  . Abdominal Pain    right side pain  . Nausea    no vomiting    HPI: Kara FleetStephanie Stewart is a 43 y.o. female here for follow-up visit.  Last seen back in February.  She has a history of chronic liver disease, alcoholic hepatitis, elevated AMA empirically treated with Urso.  Known severe hepatic steatosis with hepatosplenomegaly with likely chronic liver disease complicated by ascites requiring abdominal paracentesis back in July 2018.  Last liver imaging in 09/2016 by CT with hepatosplenomegaly and severe hepatic steatosis. No alcohol since March 2018.  Since I last saw her she has delivered a healthy baby girl, Kara Stewart.  Labs in March showed alkaline phosphatase 163, have been normal back in December.   Patient states she was advised to stop URSO after her delivery. She is not breastfeeding. Stopped Urso in 06/2018 for two months. Started having some increased energy and ruq pain off medication. Not sure if fluid building up again. Hurt bad right below waist on right side. More when using bathroom and with touching the area. She had tubal ligation four weeks ago. States she had severe pain with that and she has finally started feeling better since that.   She has BMs several per day, first one solid and rest loose. No melena, brbpr. Sometimes heartburn/reflux with certain foods. Several times a week but not bad as before. Drinks water and goes away. Does not want PPI. States it is probably because her diet is erratic and eating wrong things. Does not eat on schedule since having the baby. Also misses second dose of Urso at least 50% of the time.   No etoh since 06/2016.   Still 20 pounds of pregnancy weight to lose.   .   Current Outpatient Medications  Medication Sig Dispense Refill  . albuterol (PROVENTIL HFA;VENTOLIN HFA) 108 (90 Base)  MCG/ACT inhaler Inhale 2 puffs into the lungs every 6 (six) hours as needed for wheezing or shortness of breath. 1 Inhaler 0  . ibuprofen (ADVIL) 600 MG tablet Take 1 tablet (600 mg total) by mouth every 6 (six) hours as needed. 30 tablet 1  . oxyCODONE (OXY IR/ROXICODONE) 5 MG immediate release tablet Take 1 tablet (5 mg total) by mouth every 4 (four) hours as needed for severe pain. 15 tablet 0  . Prenatal Vit-Fe Fumarate-FA (PRENATAL MULTIVITAMIN) TABS tablet Take 1 tablet by mouth daily at 12 noon.    . ursodiol (ACTIGALL) 500 MG tablet Take 500 mg by mouth 2 (two) times a day.     No current facility-administered medications for this visit.     Allergies as of 10/10/2018 - Review Complete 10/10/2018  Allergen Reaction Noted  . Penicillins Anaphylaxis 10/18/2011  . Lidocaine Other (See Comments) 06/22/2014    ROS:  General: Negative for anorexia, weight loss, fever, chills, fatigue, weakness. ENT: Negative for hoarseness, difficulty swallowing , nasal congestion. CV: Negative for chest pain, angina, palpitations, dyspnea on exertion, peripheral edema.  Respiratory: Negative for dyspnea at rest, dyspnea on exertion, cough, sputum, wheezing.  GI: See history of present illness. GU:  Negative for dysuria, hematuria, urinary incontinence, urinary frequency, nocturnal urination.  Endo: Negative for unusual weight change.    Physical Examination:   BP 116/71   Pulse 77   Temp 98.1 F (36.7 C) (Oral)   Ht  5' 4.5" (1.638 m)   Wt 168 lb 12.8 oz (76.6 kg)   LMP 10/09/2018   Breastfeeding No   BMI 28.53 kg/m   General: Well-nourished, well-developed in no acute distress.  Eyes: No icterus. Mouth: Oropharyngeal mucosa moist and pink , no lesions erythema or exudate. Lungs: Clear to auscultation bilaterally.  Heart: Regular rate and rhythm, no murmurs rubs or gallops.  Abdomen: Bowel sounds are normal,   nondistended, no hepatosplenomegaly or masses, no abdominal bruits or hernia ,  no rebound or guarding.  Some mild to moderate right mid to ruq pain. No obvious mass or hernia. Umbilical incision site without redness or discharge.  Extremities: No lower extremity edema. No clubbing or deformities. Neuro: Alert and oriented x 4   Skin: Warm and dry, no jaundice.   Psych: Alert and cooperative, normal mood and affect.  Labs:  Lab Results  Component Value Date   CREATININE 0.77 06/15/2018   BUN 9 06/15/2018   NA 137 06/15/2018   K 4.0 06/15/2018   CL 107 06/15/2018   CO2 22 06/15/2018   Lab Results  Component Value Date   ALT 11 06/15/2018   AST 22 06/15/2018   ALKPHOS 163 (H) 06/15/2018   BILITOT 0.5 06/15/2018   Lab Results  Component Value Date   WBC 13.8 (H) 06/15/2018   HGB 14.8 06/15/2018   HCT 42.3 06/15/2018   MCV 87.2 06/15/2018   PLT 187 06/15/2018   Lab Results  Component Value Date   INR 1.0 06/15/2018   INR 1.0 01/17/2018   INR 1.3 (H) 12/23/2016    Imaging Studies: No results found.

## 2018-10-10 NOTE — Progress Notes (Signed)
CC'D TO PCP °

## 2018-10-18 ENCOUNTER — Other Ambulatory Visit: Payer: Self-pay

## 2018-10-18 ENCOUNTER — Other Ambulatory Visit (HOSPITAL_COMMUNITY)
Admission: RE | Admit: 2018-10-18 | Discharge: 2018-10-18 | Disposition: A | Discharge status: Home / Self Care | Payer: Medicaid Other | Source: Ambulatory Visit | Attending: Gastroenterology | Admitting: Gastroenterology

## 2018-10-18 ENCOUNTER — Ambulatory Visit (HOSPITAL_COMMUNITY)
Admission: RE | Admit: 2018-10-18 | Discharge: 2018-10-18 | Disposition: A | Discharge status: Home / Self Care | Payer: Medicaid Other | Source: Ambulatory Visit | Attending: Gastroenterology | Admitting: Gastroenterology

## 2018-10-18 DIAGNOSIS — R1011 Right upper quadrant pain: Secondary | ICD-10-CM | POA: Diagnosis not present

## 2018-10-18 DIAGNOSIS — K743 Primary biliary cirrhosis: Secondary | ICD-10-CM

## 2018-10-18 DIAGNOSIS — R945 Abnormal results of liver function studies: Secondary | ICD-10-CM | POA: Insufficient documentation

## 2018-10-18 DIAGNOSIS — K769 Liver disease, unspecified: Secondary | ICD-10-CM | POA: Diagnosis not present

## 2018-10-18 DIAGNOSIS — R7989 Other specified abnormal findings of blood chemistry: Secondary | ICD-10-CM

## 2018-10-18 DIAGNOSIS — R161 Splenomegaly, not elsewhere classified: Secondary | ICD-10-CM | POA: Diagnosis not present

## 2018-10-18 LAB — HEPATIC FUNCTION PANEL
ALT: 21 U/L (ref 0–44)
AST: 20 U/L (ref 15–41)
Albumin: 4.1 g/dL (ref 3.5–5.0)
Alkaline Phosphatase: 85 U/L (ref 38–126)
Bilirubin, Direct: 0.1 mg/dL (ref 0.0–0.2)
Indirect Bilirubin: 0.6 mg/dL (ref 0.3–0.9)
Total Bilirubin: 0.7 mg/dL (ref 0.3–1.2)
Total Protein: 6.7 g/dL (ref 6.5–8.1)

## 2018-10-18 LAB — CBC WITH DIFFERENTIAL/PLATELET
Abs Immature Granulocytes: 0.02 10*3/uL (ref 0.00–0.07)
Basophils Absolute: 0 10*3/uL (ref 0.0–0.1)
Basophils Relative: 1 %
Eosinophils Absolute: 0.2 10*3/uL (ref 0.0–0.5)
Eosinophils Relative: 4 %
HCT: 39.8 % (ref 36.0–46.0)
Hemoglobin: 13.8 g/dL (ref 12.0–15.0)
Immature Granulocytes: 0 %
Lymphocytes Relative: 29 %
Lymphs Abs: 1.6 10*3/uL (ref 0.7–4.0)
MCH: 33.7 pg (ref 26.0–34.0)
MCHC: 34.7 g/dL (ref 30.0–36.0)
MCV: 97.1 fL (ref 80.0–100.0)
Monocytes Absolute: 0.3 10*3/uL (ref 0.1–1.0)
Monocytes Relative: 5 %
Neutro Abs: 3.5 10*3/uL (ref 1.7–7.7)
Neutrophils Relative %: 61 %
Platelets: 151 10*3/uL (ref 150–400)
RBC: 4.1 MIL/uL (ref 3.87–5.11)
RDW: 13 % (ref 11.5–15.5)
WBC: 5.6 10*3/uL (ref 4.0–10.5)
nRBC: 0 % (ref 0.0–0.2)

## 2018-10-20 ENCOUNTER — Telehealth: Payer: Self-pay | Admitting: *Deleted

## 2018-10-20 NOTE — Telephone Encounter (Signed)
Wants to discuss u/s and lab work.  [phone]

## 2018-10-20 NOTE — Telephone Encounter (Signed)
Pt was given results.  

## 2018-12-05 ENCOUNTER — Other Ambulatory Visit: Payer: Self-pay

## 2018-12-06 ENCOUNTER — Telehealth: Payer: Self-pay | Admitting: Gastroenterology

## 2018-12-06 MED ORDER — URSODIOL 500 MG PO TABS: 500.0000 mg | ORAL_TABLET | Freq: Two times a day (BID) | ORAL | 3 refills | Status: DC

## 2018-12-06 NOTE — Telephone Encounter (Signed)
[phone]  Patient called and said that her prescription, ursidiol,  was only sent in for 15 days.  She would have to refill it 2 times a month  Please call her

## 2018-12-06 NOTE — Telephone Encounter (Signed)
Pt is aware that the Rx Anna sent in today was for #60 tablets and to take one tablet bid and was given 3 refills. She will call pharmacy and if there is a problem she will let me know.

## 2018-12-22 ENCOUNTER — Other Ambulatory Visit: Payer: Self-pay | Admitting: Obstetrics & Gynecology

## 2018-12-22 ENCOUNTER — Encounter: Payer: Self-pay | Admitting: Obstetrics & Gynecology

## 2018-12-22 ENCOUNTER — Other Ambulatory Visit: Payer: Self-pay

## 2018-12-22 ENCOUNTER — Ambulatory Visit (INDEPENDENT_AMBULATORY_CARE_PROVIDER_SITE_OTHER): Payer: Medicaid Other | Admitting: Obstetrics & Gynecology

## 2018-12-22 VITALS — BP 113/78 | HR 88 | Wt 171.3 lb

## 2018-12-22 DIAGNOSIS — R109 Unspecified abdominal pain: Secondary | ICD-10-CM

## 2018-12-22 DIAGNOSIS — Z9889 Other specified postprocedural states: Secondary | ICD-10-CM | POA: Diagnosis not present

## 2018-12-22 NOTE — Progress Notes (Signed)
   Subjective:    Patient ID: Kara Stewart, female    DOB: 1975-10-17, 43 y.o.   MRN: [id number]  HPI 43 yo divorced P26 (50 month old daughter) here for a post op visit. She had a laparoscopic application of Filsche clips on 09/14/18. She reports that she has had extreme cramping in her lower pelvis since then. She has taken IBU 600 mg BID.  She is seeing a GI doctor for symptoms that sound suspicious for IBS.   Review of Systems She reports a normal pap smear at [facility] in 2019.    Objective:   Physical Exam  Breathing, conversing, and ambulating normally Well nourished, well hydrated White female, no apparent distress Abd- benign Umbilical incision-healed well Bimanual exam reveals no masses, some tenderness on the right      Assessment & Plan:  Cramping in pelvis since BTL- it is difficult to separate out these symptoms from her GI symptoms. I have ordered a gyn u/s and rec'd that she take IBU 600 mg po q6 hours instead of BID. Come back after u/s results available

## 2018-12-22 NOTE — Addendum Note (Signed)
Addended by: Emily Filbert on: 12/22/2018 10:46 AM   Modules accepted: Orders

## 2018-12-29 ENCOUNTER — Ambulatory Visit (HOSPITAL_COMMUNITY)
Admission: RE | Admit: 2018-12-29 | Discharge: 2018-12-29 | Disposition: A | Discharge status: Home / Self Care | Payer: Medicaid Other | Source: Ambulatory Visit | Attending: Obstetrics & Gynecology | Admitting: Obstetrics & Gynecology

## 2018-12-29 ENCOUNTER — Other Ambulatory Visit: Payer: Self-pay

## 2018-12-29 DIAGNOSIS — R109 Unspecified abdominal pain: Secondary | ICD-10-CM | POA: Diagnosis not present

## 2018-12-29 DIAGNOSIS — R102 Pelvic and perineal pain: Secondary | ICD-10-CM | POA: Diagnosis not present

## 2019-01-02 ENCOUNTER — Telehealth: Payer: Self-pay | Admitting: *Deleted

## 2019-01-02 NOTE — Telephone Encounter (Signed)
Pt left message stating that she is calling for her [place] results from last week. She requests a call back.

## 2019-01-04 NOTE — Telephone Encounter (Signed)
Pt's concerns addressed via MyChart.

## 2019-01-16 NOTE — Progress Notes (Signed)
REVIEWED-NO ADDITIONAL RECOMMENDATIONS. 

## 2019-02-15 ENCOUNTER — Ambulatory Visit: Payer: Medicaid Other | Admitting: Obstetrics & Gynecology

## 2019-02-16 ENCOUNTER — Telehealth: Payer: Medicaid Other | Admitting: Obstetrics & Gynecology

## 2019-02-16 ENCOUNTER — Encounter: Payer: Self-pay | Admitting: Obstetrics & Gynecology

## 2019-02-17 ENCOUNTER — Telehealth: Payer: Medicaid Other | Admitting: Obstetrics & Gynecology

## 2019-02-17 ENCOUNTER — Encounter: Payer: Self-pay | Admitting: Obstetrics & Gynecology

## 2019-02-17 ENCOUNTER — Telehealth: Payer: Self-pay

## 2019-02-17 NOTE — Telephone Encounter (Signed)
Attempted to call pt twice to begin virtual visit. Voicemail left asking pt to return call to office.

## 2019-02-17 NOTE — Progress Notes (Signed)
dnka

## 2019-02-20 NOTE — Progress Notes (Signed)
dnka

## 2019-04-12 ENCOUNTER — Telehealth: Payer: Self-pay

## 2019-04-12 ENCOUNTER — Encounter: Payer: Self-pay | Admitting: Gastroenterology

## 2019-04-12 ENCOUNTER — Other Ambulatory Visit: Payer: Self-pay

## 2019-04-12 ENCOUNTER — Ambulatory Visit: Payer: Medicaid Other | Admitting: Gastroenterology

## 2019-04-12 VITALS — BP 118/75 | HR 87 | Temp 97.0°F | Ht 64.0 in | Wt 166.6 lb

## 2019-04-12 DIAGNOSIS — K743 Primary biliary cirrhosis: Secondary | ICD-10-CM | POA: Diagnosis not present

## 2019-04-12 DIAGNOSIS — R1031 Right lower quadrant pain: Secondary | ICD-10-CM

## 2019-04-12 DIAGNOSIS — R109 Unspecified abdominal pain: Secondary | ICD-10-CM | POA: Diagnosis not present

## 2019-04-12 DIAGNOSIS — K769 Liver disease, unspecified: Secondary | ICD-10-CM

## 2019-04-12 NOTE — Telephone Encounter (Signed)
CT approved. PA# [id number], valid 04/12/19-10/09/19.

## 2019-04-12 NOTE — Progress Notes (Signed)
Cc'ed to pcp °

## 2019-04-12 NOTE — Patient Instructions (Signed)
1. CT scan and labs as planned. We will be in touch with results as available.  2. Return office visit in four months.

## 2019-04-12 NOTE — Progress Notes (Signed)
Primary Care Physician: Caren Macadam, MD  Primary Gastroenterologist:  Barney Drain, MD   Chief Complaint  Patient presents with  . Hepatic Disease  . Abdominal Pain    RLQ, pain getting worse, all the time daily pain    HPI: Kara Stewart is a 44 y.o. female here for follow-up.  Last seen in July.  History of chronic liver disease, alcoholic hepatitis, elevated ANA empirically treated with URSO.  None severe hepatic steatosis with hepatosplenomegaly with likely chronic liver disease complicated by ascites requiring abdominal paracentesis back in July 2018.  She had a normal healthy pregnancy in early 2020.  She had a tubal ligation in June 2020.  Last alcohol consumption March 2018. She has a history of chronic intermittent right mid to right upper quadrant pain.  Abdominal ultrasound July 2020 no gallstones.  Mild increase in echogenicity of the liver.  Spleen prominent.  Increased vascularity in the splenic hilum noted which may be related to mild varices.  Pelvic ultrasound September 2020, unremarkable.  Labs July 2020 with normal LFTs, CBC.  Back to work at the [employer], working two days a week.  Feels that she has had more right-sided abdominal pain the last few months.  Pain is right of the umbilicus.  Complains of abdominal swelling.  Sitting and standing makes right sided pain worse. Right sided pain more constant. A lot of fatigue. Daughter 23 pounds, aggravates her right-sided pain she has to lift her especially in the car seat.  No postprandial abdominal pain.  Pain is better if she is able to lay on her right side.  Laying on the left side makes it worse.  After a long day at work, has increased pain and has to lay in the bed propped up with a pillow to get relief.  Has to wear loosefitting clothes around her midline because it would increase the pain.  Bowel movements are soft to loose.  May have to have 3:57 in the morning to complete a bowel movement.  No melena or rectal  bleeding.  Taking URSO once every day but forgets the second dose about 50% of the time.    Wonders if her liver disease is hereditary.  Mother recently found to have abnormal LFTs.  She is somewhat obese.  Brother also found to have liver disease with known risk factors.   Current Outpatient Medications  Medication Sig Dispense Refill  . albuterol (PROVENTIL HFA;VENTOLIN HFA) 108 (90 Base) MCG/ACT inhaler Inhale 2 puffs into the lungs every 6 (six) hours as needed for wheezing or shortness of breath. 1 Inhaler 0  . ibuprofen (ADVIL) 600 MG tablet Take 1 tablet (600 mg total) by mouth every 6 (six) hours as needed. 30 tablet 1  . ursodiol (ACTIGALL) 500 MG tablet Take 1 tablet (500 mg total) by mouth 2 (two) times daily. 60 tablet 3  . oxyCODONE (OXY IR/ROXICODONE) 5 MG immediate release tablet Take 1 tablet (5 mg total) by mouth every 4 (four) hours as needed for severe pain. (Patient not taking: Reported on 12/22/2018) 15 tablet 0  . Prenatal Vit-Fe Fumarate-FA (PRENATAL MULTIVITAMIN) TABS tablet Take 1 tablet by mouth daily at 12 noon.     No current facility-administered medications for this visit.    Allergies as of 04/12/2019 - Review Complete 04/12/2019  Allergen Reaction Noted  . Penicillins Anaphylaxis 10/18/2011  . Lidocaine Other (See Comments) 06/22/2014    ROS:  General: Negative for anorexia, weight loss, fever, chills, fatigue,  weakness. ENT: Negative for hoarseness, difficulty swallowing , nasal congestion. CV: Negative for chest pain, angina, palpitations, dyspnea on exertion, peripheral edema.  Respiratory: Negative for dyspnea at rest, dyspnea on exertion, cough, sputum, wheezing.  GI: See history of present illness. GU:  Negative for dysuria, hematuria, urinary incontinence, urinary frequency, nocturnal urination.  Endo: Negative for unusual weight change.    Physical Examination:   BP 118/75   Pulse 87   Temp (!) 97 F (36.1 C) (Oral)   Ht 5\' 4"  (1.626 m)    Wt 166 lb 9.6 oz (75.6 kg)   LMP 03/24/2019   BMI 28.60 kg/m   General: Well-nourished, well-developed in no acute distress.  Eyes: No icterus. Mouth: Oropharyngeal mucosa moist and pink , no lesions erythema or exudate. Lungs: Clear to auscultation bilaterally.  Heart: Regular rate and rhythm, no murmurs rubs or gallops.  Abdomen: Bowel sounds are normal, nondistended, no hepatosplenomegaly or masses, no abdominal bruits or hernia , no rebound or guarding.  Right mid-abd pain, right of umbilicus, mild to moderate tenderness. No mass.  Extremities: No lower extremity edema. No clubbing or deformities. Neuro: Alert and oriented x 4   Skin: Warm and dry, no jaundice.   Psych: Alert and cooperative, normal mood and affect.   Imaging Studies: No results found.

## 2019-04-12 NOTE — Assessment & Plan Note (Signed)
44 year old female with history of alcoholic hepatitis complicated by ascites requiring paracentesis in 2018, positive AMA on empiric Urso since 2018.  Delivered healthy baby girl in March 2020.  No complications during pregnancy.  Labs in July with normal LFTs.  Last alcohol consumption in March 2018.  Historically has had recurrent right mid to right upper quadrant abdominal pain.  Since we last saw her she has seen her gynecologist with unremarkable pelvic ultrasound.  Her pain is predominantly in the right mid abdomen sometimes radiates into the right hip.  Unrelated to meals or bowel function.  Worse with prolonged sitting and standing.  Because of persisting pain will proceed with CT abdomen pelvis with contrast.  Evaluate pain as well as reassess due to chronic liver disease.  Update labs.  Further recommendations to follow.

## 2019-04-12 NOTE — Telephone Encounter (Signed)
PA for CT abd/pelvis submitted via [employer]. Case went to clinical review. Service order: [id number]. Clinical notes faxed to [employer].

## 2019-04-12 NOTE — Telephone Encounter (Signed)
CT abd/pelvis w/contrast scheduled for 04/28/19 at 10:00am, arrive at 9:45am. NPO 4 hours prior to test. Pick up contrast before day of test.  Called to inform pt of CT appt. Pt wants to reschedule. Gave her phone number to Central Scheduling to reschedule CT.

## 2019-04-28 ENCOUNTER — Ambulatory Visit (HOSPITAL_COMMUNITY): Payer: Medicaid Other

## 2019-05-03 ENCOUNTER — Ambulatory Visit (HOSPITAL_COMMUNITY)
Admission: RE | Admit: 2019-05-03 | Discharge: 2019-05-03 | Disposition: A | Discharge status: Home / Self Care | Payer: Medicaid Other | Source: Ambulatory Visit | Attending: Gastroenterology | Admitting: Gastroenterology

## 2019-05-03 ENCOUNTER — Other Ambulatory Visit (HOSPITAL_COMMUNITY): Payer: Self-pay | Admitting: Family Medicine

## 2019-05-03 ENCOUNTER — Other Ambulatory Visit: Payer: Self-pay

## 2019-05-03 ENCOUNTER — Other Ambulatory Visit (HOSPITAL_COMMUNITY)
Admission: RE | Admit: 2019-05-03 | Discharge: 2019-05-03 | Disposition: A | Discharge status: Home / Self Care | Payer: Medicaid Other | Source: Ambulatory Visit | Attending: Gastroenterology | Admitting: Gastroenterology

## 2019-05-03 DIAGNOSIS — R52 Pain, unspecified: Secondary | ICD-10-CM

## 2019-05-03 DIAGNOSIS — R1031 Right lower quadrant pain: Secondary | ICD-10-CM

## 2019-05-03 DIAGNOSIS — K743 Primary biliary cirrhosis: Secondary | ICD-10-CM | POA: Diagnosis present

## 2019-05-03 DIAGNOSIS — R100 Acute abdomen: Secondary | ICD-10-CM | POA: Insufficient documentation

## 2019-05-03 LAB — COMPREHENSIVE METABOLIC PANEL
ALT: 24 U/L (ref 0–44)
AST: 24 U/L (ref 15–41)
Albumin: 3.9 g/dL (ref 3.5–5.0)
Alkaline Phosphatase: 77 U/L (ref 38–126)
Anion gap: 8 (ref 5–15)
BUN: 7 mg/dL (ref 6–20)
CO2: 25 mmol/L (ref 22–32)
Calcium: 8.9 mg/dL (ref 8.9–10.3)
Chloride: 103 mmol/L (ref 98–111)
Creatinine, Ser: 0.81 mg/dL (ref 0.44–1.00)
GFR calc Af Amer: >60 mL/min (ref >60)
GFR calc non Af Amer: >60 mL/min (ref >60)
Glucose, Bld: 98 mg/dL (ref 70–99)
Potassium: 3.9 mmol/L (ref 3.5–5.1)
Sodium: 136 mmol/L (ref 135–145)
Total Bilirubin: 1.7 mg/dL — ABNORMAL HIGH (ref 0.3–1.2)
Total Protein: 7 g/dL (ref 6.5–8.1)

## 2019-05-03 LAB — CBC WITH DIFFERENTIAL/PLATELET
Abs Immature Granulocytes: 0.02 10*3/uL (ref 0.00–0.07)
Basophils Absolute: 0.1 10*3/uL (ref 0.0–0.1)
Basophils Relative: 1 %
Eosinophils Absolute: 0.1 10*3/uL (ref 0.0–0.5)
Eosinophils Relative: 2 %
HCT: 42.9 % (ref 36.0–46.0)
Hemoglobin: 14.9 g/dL (ref 12.0–15.0)
Immature Granulocytes: 0 %
Lymphocytes Relative: 24 %
Lymphs Abs: 1.5 10*3/uL (ref 0.7–4.0)
MCH: 36.2 pg — ABNORMAL HIGH (ref 26.0–34.0)
MCHC: 34.7 g/dL (ref 30.0–36.0)
MCV: 104.1 fL — ABNORMAL HIGH (ref 80.0–100.0)
Monocytes Absolute: 0.3 10*3/uL (ref 0.1–1.0)
Monocytes Relative: 5 %
Neutro Abs: 4.1 10*3/uL (ref 1.7–7.7)
Neutrophils Relative %: 68 %
Platelets: 178 10*3/uL (ref 150–400)
RBC: 4.12 MIL/uL (ref 3.87–5.11)
RDW: 12.7 % (ref 11.5–15.5)
WBC: 6 10*3/uL (ref 4.0–10.5)
nRBC: 0 % (ref 0.0–0.2)

## 2019-05-03 LAB — PROTIME-INR
INR: 1.1 (ref 0.8–1.2)
Prothrombin Time: 13.9 seconds (ref 11.4–15.2)

## 2019-05-03 MED ORDER — IOHEXOL 300 MG/ML  SOLN: 100.0000 mL | Freq: Once | INTRAMUSCULAR | Status: DC | PRN

## 2019-05-04 ENCOUNTER — Other Ambulatory Visit (HOSPITAL_COMMUNITY)
Admission: RE | Admit: 2019-05-04 | Discharge: 2019-05-04 | Disposition: A | Discharge status: Home / Self Care | Payer: Medicaid Other | Source: Ambulatory Visit | Attending: Gastroenterology | Admitting: Gastroenterology

## 2019-05-04 ENCOUNTER — Telehealth: Payer: Self-pay | Admitting: Gastroenterology

## 2019-05-04 ENCOUNTER — Other Ambulatory Visit: Payer: Self-pay

## 2019-05-04 ENCOUNTER — Ambulatory Visit (HOSPITAL_COMMUNITY)
Admission: RE | Admit: 2019-05-04 | Discharge: 2019-05-04 | Disposition: A | Discharge status: Home / Self Care | Payer: Medicaid Other | Source: Ambulatory Visit | Attending: Gastroenterology | Admitting: Gastroenterology

## 2019-05-04 DIAGNOSIS — R109 Unspecified abdominal pain: Secondary | ICD-10-CM | POA: Diagnosis not present

## 2019-05-04 DIAGNOSIS — R1031 Right lower quadrant pain: Secondary | ICD-10-CM | POA: Diagnosis not present

## 2019-05-04 LAB — IGG, IGA, IGM
IgA: 227 mg/dL (ref 87–352)
IgG (Immunoglobin G), Serum: 789 mg/dL (ref 586–1602)
IgM (Immunoglobulin M), Srm: 187 mg/dL (ref 26–217)

## 2019-05-04 LAB — ANTI-SMOOTH MUSCLE ANTIBODY, IGG: F-Actin IgG: 6 Units (ref 0–19)

## 2019-05-04 LAB — MITOCHONDRIAL ANTIBODIES: Mitochondrial M2 Ab, IgG: 41.8 Units — ABNORMAL HIGH (ref 0.0–20.0)

## 2019-05-04 LAB — ANA: Anti Nuclear Antibody (ANA): NEGATIVE

## 2019-05-04 LAB — TISSUE TRANSGLUTAMINASE, IGA: Tissue Transglutaminase Ab, IgA: <2 U/mL (ref 0–3)

## 2019-05-04 LAB — HCG, SERUM, QUALITATIVE: Preg, Serum: NEGATIVE

## 2019-05-04 MED ORDER — IOHEXOL 300 MG/ML  SOLN
100.0000 mL | Freq: Once | INTRAMUSCULAR | Status: AC | PRN
  Administered 2019-05-04: 100 mL via INTRAVENOUS

## 2019-05-04 NOTE — Telephone Encounter (Signed)
Pt was returning call to DS. [phone]

## 2019-05-04 NOTE — Telephone Encounter (Signed)
See previous note, I have spoken to pt.  

## 2019-05-09 ENCOUNTER — Telehealth: Payer: Self-pay | Admitting: Gastroenterology

## 2019-05-09 ENCOUNTER — Other Ambulatory Visit: Payer: Self-pay

## 2019-05-09 DIAGNOSIS — R718 Other abnormality of red blood cells: Secondary | ICD-10-CM

## 2019-05-09 NOTE — Telephone Encounter (Signed)
See result note. Pt is aware.  

## 2019-05-09 NOTE — Telephone Encounter (Signed)
Pt returning call. [phone]

## 2019-05-11 ENCOUNTER — Telehealth: Payer: Self-pay

## 2019-05-11 ENCOUNTER — Other Ambulatory Visit: Payer: Self-pay

## 2019-05-11 DIAGNOSIS — M545 Low back pain: Secondary | ICD-10-CM | POA: Diagnosis not present

## 2019-05-11 DIAGNOSIS — R5383 Other fatigue: Secondary | ICD-10-CM

## 2019-05-11 DIAGNOSIS — G8929 Other chronic pain: Secondary | ICD-10-CM | POA: Diagnosis not present

## 2019-05-11 DIAGNOSIS — M546 Pain in thoracic spine: Secondary | ICD-10-CM | POA: Diagnosis not present

## 2019-05-11 DIAGNOSIS — M25519 Pain in unspecified shoulder: Secondary | ICD-10-CM | POA: Diagnosis not present

## 2019-05-11 NOTE — Telephone Encounter (Signed)
PT is aware and orders have been entered for [facility].  I told her to let the lab know she has orders from 2 different days.

## 2019-05-11 NOTE — Addendum Note (Signed)
Addended by: Lavena Bullion on: 05/11/2019 11:01 AM   Modules accepted: Orders

## 2019-05-11 NOTE — Telephone Encounter (Signed)
Pt called and said she saw Dr. Francee Piccolo this morning.  He wanted to know since we have labs ordered for pt if Tana Coast, PA would order TSH when she has the other labs. s  Pt is complaining of extreme fatigue.  Verlon Au, please advise!

## 2019-05-11 NOTE — Addendum Note (Signed)
Addended by: Lavena Bullion on: 05/11/2019 11:02 AM   Modules accepted: Orders

## 2019-05-11 NOTE — Telephone Encounter (Signed)
Ok to schedule TSH for fatigue.

## 2019-05-12 DIAGNOSIS — R5383 Other fatigue: Secondary | ICD-10-CM | POA: Diagnosis not present

## 2019-05-12 DIAGNOSIS — R718 Other abnormality of red blood cells: Secondary | ICD-10-CM | POA: Diagnosis not present

## 2019-05-13 ENCOUNTER — Encounter: Payer: Self-pay | Admitting: Gastroenterology

## 2019-05-13 LAB — TSH: TSH: 1.37 u[IU]/mL (ref 0.450–4.500)

## 2019-05-13 LAB — VITAMIN B12: Vitamin B-12: 172 pg/mL — ABNORMAL LOW (ref 232–1245)

## 2019-05-13 LAB — FOLATE: Folate: 3 ng/mL — ABNORMAL LOW (ref >3.0)

## 2019-05-16 ENCOUNTER — Other Ambulatory Visit: Payer: Self-pay

## 2019-05-16 DIAGNOSIS — R7989 Other specified abnormal findings of blood chemistry: Secondary | ICD-10-CM

## 2019-05-16 DIAGNOSIS — K743 Primary biliary cirrhosis: Secondary | ICD-10-CM

## 2019-06-01 ENCOUNTER — Other Ambulatory Visit: Payer: Self-pay | Admitting: Gastroenterology

## 2019-06-12 ENCOUNTER — Other Ambulatory Visit: Payer: Self-pay | Admitting: Gastroenterology

## 2019-07-04 ENCOUNTER — Telehealth: Payer: Self-pay | Admitting: Gastroenterology

## 2019-07-04 NOTE — Telephone Encounter (Signed)
Pt called to say that [facility] had sent [place] a refill request on her B12 injections and they haven't received anything from [place] yet. She was following up because she was out. Please advise. [phone]

## 2019-07-04 NOTE — Telephone Encounter (Signed)
Call pharmacy to verify b12 refill was received on 06/12/19. They stated they did receive the refill and the injection was ready for pick up. Notified pt. Pt stated she is familiar and comfortable with giving herself her own injections and wanted to make sure that was ok because it would save her $10. Notified pt that as long as she is comfortable that should be fine. Pt stated she understood and thanked me for the call

## 2019-07-05 ENCOUNTER — Telehealth: Payer: Self-pay | Admitting: Gastroenterology

## 2019-07-05 NOTE — Telephone Encounter (Signed)
Routing to [place] who has recently seen patient.

## 2019-07-05 NOTE — Telephone Encounter (Signed)
[phone] please call patient ,she had questions about her meds

## 2019-07-05 NOTE — Telephone Encounter (Signed)
Pt called and stated she received her b12 injection today and does not understand why it went from once a week to once a month. She stated she is still tired all the time and she is scared she may have cancer because her grandfather did after he started  receiving the b12 injections. I notified her that is how the injections are usually written and that we will  recheck her labs again during her next visit. Pt stated she should be feeling better by now because it has been a month and that she wants to be around for her 44 year old. I notified her to increase the foods that are high in b12 and she state she is already doing that and she is still concerned and wanted a message to be sent to the provider

## 2019-07-06 NOTE — Telephone Encounter (Signed)
Called pt and rescheduled her appt for 4/16 at 8am. She also stated she is taking 1mg  of folic acid as prescribed and I notified her that the provider stated the b12 mg injections were appropriate. She stated she understood and thanked me for the call. Note from 3/30 was because she stated pharmacy did not have refill for her b12. However, I called the pharmacy and they did. So pt went and got medication on 07/05/19

## 2019-07-06 NOTE — Telephone Encounter (Signed)
There is a phone note from 07/04/19 in chart, was this routed??    B12 q week for four weeks and then monthly is appropriate.  Make sure she is also taking the folic acid 1mg  as prescribed.  Let's go ahead and bring her in for OV within the next two weeks.

## 2019-07-08 NOTE — Telephone Encounter (Signed)
Noted  

## 2019-07-10 ENCOUNTER — Other Ambulatory Visit: Payer: Self-pay

## 2019-07-10 DIAGNOSIS — R7989 Other specified abnormal findings of blood chemistry: Secondary | ICD-10-CM

## 2019-07-10 DIAGNOSIS — K743 Primary biliary cirrhosis: Secondary | ICD-10-CM

## 2019-07-11 NOTE — Progress Notes (Signed)
REVIEWED-NO ADDITIONAL RECOMMENDATIONS. 

## 2019-07-21 ENCOUNTER — Encounter: Payer: Self-pay | Admitting: Gastroenterology

## 2019-07-21 ENCOUNTER — Other Ambulatory Visit: Payer: Self-pay

## 2019-07-21 ENCOUNTER — Ambulatory Visit: Payer: Medicaid Other | Admitting: Gastroenterology

## 2019-07-21 VITALS — BP 117/76 | HR 80 | Temp 97.1°F | Ht 64.5 in | Wt 171.4 lb

## 2019-07-21 DIAGNOSIS — R1031 Right lower quadrant pain: Secondary | ICD-10-CM | POA: Diagnosis not present

## 2019-07-21 DIAGNOSIS — G8929 Other chronic pain: Secondary | ICD-10-CM

## 2019-07-21 DIAGNOSIS — K743 Primary biliary cirrhosis: Secondary | ICD-10-CM

## 2019-07-21 NOTE — Progress Notes (Signed)
Referring Provider: Aliene Beams, MD Primary GI: Dr. Darrick Penna   Chief Complaint  Patient presents with  . Abdominal Pain    RLQ and radiates into back getting worse  . Cirrhosis    HPI:   Kara Stewart is a 44 y.o. 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 Last alcohol about 2 years ago. Chronic intermittent right mid to RUQ pain. In 2018 course complicated by ascites requiring para. CT completed Jan 2021 due to persistent pain, showing cystic lesion of right ovary measuring 2.3 cm with rim enhancement, pregnancy test negative. She was to have GYN appt for follow-up. She was seen by Dr. Mora Appl (GYN) and felt chronic pain unlikely GYN in nature. Referred to PT and pain management.   Labs from Feb 2021 with B12 and folate deficiency. Due for repeat CBC, B12, folate in May 2021.   Feels like liver disease has gotten better. Can't tell a difference with B12 injections. Feels exhausted. Pushes self to get through the day. Tried to go back part-time. Has RLQ pain that is constant, waxing and waning depending on how long on feet, how she turns, hurts during sex. No relationship to bowel habits. Stool is more loose than baseline but not diarrhea. Drinking whole milk because daughter is drinking it.   Wears pajama pants to avoid pressure at abdomen in RLQ.    Tried a back brace without improvement. Helps her to pick up her daughter. Not doing a lot of physical activity. Feels out of breath when walking to mailbox. Back, shoulders hurting. Can't blow dry hair well because of pain in shoulders/bones, hands. Has left leg numbness and paresthesia of left foot. Tried different shoes. Vision is getting worse. Scared of cancer. Multiple kinds of cancer in family. Tries to take Urso BID but sometimes can't remember the second dose. Gets busy and forgets about it. Lower back pain is constant. Usually  midline lower back, sometimes radiates up. Has to have back massaged to get relief.   Dad and grandmother have RA.   No PCP currently. Has seen Dr. [name] in past.   Past Medical History:  Diagnosis Date  . Acid reflux   . 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 Alcoholic hepatitis without ascites   . Anxiety   . Asthma   . Colitis   . GERD (gastroesophageal reflux disease) 07/21/2012  . Hiatal hernia   . History of alcohol abuse 04/26/2015  . Hypertriglyceridemia   . Hypothyroid   . Iritis    frequent  . Ovarian cyst   . Panic attacks   . SVT (supraventricular tachycardia) (HCC)    last issue 12/2017    Past Surgical History:  Procedure Laterality Date  . BIOPSY N/A 02/05/2015   Procedure: BIOPSY;  Surgeon: West Bali, MD;  Location: [facility];  Service: Endoscopy;  Laterality: N/A;  . COLONOSCOPY    . COLONOSCOPY WITH PROPOFOL N/A 02/05/2015   YWV:PXTGG HH/mild diverticulosis  . ESOPHAGEAL DILATION N/A 11/21/2015   Procedure: ESOPHAGEAL DILATION;  Surgeon: Corbin Ade, MD;  Location: [facility];  Service: Endoscopy;  Laterality: N/A;  . ESOPHAGOGASTRODUODENOSCOPY  11/06/2011   [name]: MILD Esophagitis/PATENT ESOPHAGEAL Stricture/  Moderate gastritis. Bx no.hpylori or celiac, +gastritis  . ESOPHAGOGASTRODUODENOSCOPY (EGD) WITH PROPOFOL N/A 03/20/2014   [name]: 1. Mild esophagitis & distal esophagela stricture. 2. small hiatal hernia 3. moderate non-erosive gastritis and mild duodenits  . ESOPHAGOGASTRODUODENOSCOPY (EGD)  WITH PROPOFOL N/A 11/21/2015   Dr. Jena Gauss: LA grade B esophagitis, MW tear likely source of hematemesis  . LAPAROSCOPIC TUBAL LIGATION Bilateral 09/14/2018   Procedure: LAPAROSCOPIC TUBAL LIGATION;  Surgeon: Allie Bossier, MD;  Location: [facility];  Service: Gynecology;  Laterality: Bilateral;  . SAVORY DILATION N/A 03/20/2014   Procedure: SAVORY DILATION;  Surgeon: West Bali, MD;  Location: [facility];  Service: Endoscopy;  Laterality: N/A;  dilated with  # 12.8, 14,15,16  . TOOTH EXTRACTION      Current Outpatient Medications  Medication Sig Dispense Refill  . albuterol (PROVENTIL HFA;VENTOLIN HFA) 108 (90 Base) MCG/ACT inhaler Inhale 2 puffs into the lungs every 6 (six) hours as needed for wheezing or shortness of breath. 1 Inhaler 0  . cyanocobalamin (,VITAMIN B-12,) 1000 MCG/ML injection INJECT INTO THE SKIN ONCE WEEKLY FOR FOUR WEEKS THEN ONCE PER MONTH 4 mL 0  . folic acid (FOLVITE) 1 MG tablet Take 1 mg by mouth daily.    . ursodiol (ACTIGALL) 500 MG tablet TAKE 1 TABLET BY MOUTH TWICE A DAY 60 tablet 3   No current facility-administered medications for this visit.    Allergies as of 07/21/2019 - Review Complete 07/21/2019  Allergen Reaction Noted  . Penicillins Anaphylaxis 10/18/2011  . Lidocaine Other (See Comments) 06/22/2014    Family History  Problem Relation Age of Onset  . Stomach cancer Paternal Grandfather        colon cancer  . Cancer Paternal Grandfather        throat and esophagus  . Breast cancer Maternal Grandmother   . Cancer Maternal Grandmother        skin  . Anxiety disorder Maternal Grandmother   . Hypertension Mother   . Other Mother        fatty liver  . Hyperlipidemia Mother   . Liver disease Mother        fatty liver, does not drink.   . Other Father        varicose veins; stomach issues; hernia  . Hypertension Father   . Hyperlipidemia Father   . Cancer Father        prostate  . Arthritis Father        rheumatoid  . Neuropathy Father   . Thyroid disease Sister   . Hypertension Sister   . Other Brother        hernia  . Diabetes Maternal Grandfather   . Heart disease Maternal Grandfather   . Other Paternal Grandmother        hernia  . COPD Paternal Grandmother   . Diabetes Paternal Grandmother     Social History   Socioeconomic History  . Marital status: Divorced    Spouse name: Not on file  . Number of children: 0  . Years of education: Not on file  . Highest education  level: Not on file  Occupational History  . Occupation: was at [employer] but not working now  Tobacco Use  . Smoking status: Current Some Day Smoker    Types: Cigarettes    Last attempt to quit: 12/22/2017    Years since quitting: 1.5  . Smokeless tobacco: Never Used  . Tobacco comment: "might smoke one on a bad day"  Substance and Sexual Activity  . Alcohol use: No    Alcohol/week: 0.0 standard drinks    Comment: Sober since 2017  . Drug use: Never  . Sexual activity: Yes    Birth control/protection: Condom  Other  Topics Concern  . Not on file  Social History Narrative   Lives alone with a roommate. Dating and in safe relationship. Does not smoke. Denies drug use.    Previous MD: Deborah Chalk, NP (Tarri Fuller, [place])      Social Determinants of Health   Financial Resource Strain:   . Difficulty of Paying Living Expenses:   Food Insecurity:   . Worried About Charity fundraiser in the Last Year:   . Arboriculturist in the Last Year:   Transportation Needs:   . Film/video editor (Medical):   Marland Kitchen Lack of Transportation (Non-Medical):   Physical Activity:   . Days of Exercise per Week:   . Minutes of Exercise per Session:   Stress:   . Feeling of Stress :   Social Connections:   . Frequency of Communication with Friends and Family:   . Frequency of Social Gatherings with Friends and Family:   . Attends Religious Services:   . Active Member of Clubs or Organizations:   . Attends Archivist Meetings:   Marland Kitchen Marital Status:     Review of Systems: As mentioned I HPI  Physical Exam: BP 117/76   Pulse 80   Temp (!) 97.1 F (36.2 C) (Oral)   Ht 5' 4.5" (1.638 m)   Wt 171 lb 6.4 oz (77.7 kg)   LMP 07/06/2019   BMI 28.97 kg/m  General:   Alert and oriented. No distress noted. Pleasant and cooperative.  Head:  Normocephalic and atraumatic. Eyes:  Conjuctiva clear without scleral icterus. Mouth:  Mask in place Abdomen:  +BS, soft, TTP RLQ  and non-distended. No rebound or  guarding. No HSM or masses noted. Msk:  Symmetrical without gross deformities. Normal posture. Extremities:  Without edema. Neurologic:  Alert and  oriented x4 Psych:  Alert and cooperative. Normal mood and affect.   ASSESSMENT: Kara Stewart is a 44 y.o. 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 She is being treated for B12 and folate deficiency currently. RLQ pain persistent, with CT earlier this year showing cystic lesion of right ovary; pregnancy test was negative.    PBC: non-compliant with Urso. Discussed taking this BID and setting an alarm if needed. Vit D, iron studies, CBC, B12 and folate ordered.   RLQ pain: doubt this is GI related. As per CT recommendations, pursuing pelvic ultrasound. Refer back to GYN if persistent cyst or complicated features.   She will need to establish care with PCP due to multiple non-GI concerns. I have given her the office number for Dr. Mannie Stabile, whom she has seen before.    PLAN:   Pelvic ultrasound in near future  Refer to Rheumatology in setting of chronic pain, concern for underlying inflammatory disorder  Complete CBC, iron studies, B12, Vit D  Urso BID  3 month return  Annitta Needs, PhD, [facility]

## 2019-07-21 NOTE — Patient Instructions (Addendum)
Please call [facility] [phone]) to get back in with Dr. [name].  Please complete blood work. I have added Vit D and iron studies as well.   We have ordered a pelvic ultrasound.   I am referring you to Rheumatology.   Make sure to take Urso twice a day. It is important that you take this twice per day. Set an alarm if you need to!  We will see you back in 3 months!   It was a pleasure to see you today. I want to create trusting relationships with patients to provide genuine, compassionate, and quality care. I value your feedback. If you receive a survey regarding your visit,  I greatly appreciate you taking time to fill this out.   Gelene Mink, PhD, ANP-BC [facility]

## 2019-07-21 NOTE — Progress Notes (Unsigned)
mb re 

## 2019-07-24 ENCOUNTER — Other Ambulatory Visit: Payer: Self-pay

## 2019-07-24 ENCOUNTER — Other Ambulatory Visit (HOSPITAL_COMMUNITY)
Admission: RE | Admit: 2019-07-24 | Discharge: 2019-07-24 | Disposition: A | Discharge status: Home / Self Care | Payer: Medicaid Other | Source: Ambulatory Visit | Attending: Gastroenterology | Admitting: Gastroenterology

## 2019-07-24 DIAGNOSIS — R7989 Other specified abnormal findings of blood chemistry: Secondary | ICD-10-CM | POA: Diagnosis not present

## 2019-07-24 DIAGNOSIS — K743 Primary biliary cirrhosis: Secondary | ICD-10-CM | POA: Insufficient documentation

## 2019-07-24 LAB — CBC WITH DIFFERENTIAL/PLATELET
Abs Immature Granulocytes: 0.03 10*3/uL (ref 0.00–0.07)
Basophils Absolute: 0 10*3/uL (ref 0.0–0.1)
Basophils Relative: 1 %
Eosinophils Absolute: 0.1 10*3/uL (ref 0.0–0.5)
Eosinophils Relative: 1 %
HCT: 41.4 % (ref 36.0–46.0)
Hemoglobin: 14.2 g/dL (ref 12.0–15.0)
Immature Granulocytes: 1 %
Lymphocytes Relative: 21 %
Lymphs Abs: 1.3 10*3/uL (ref 0.7–4.0)
MCH: 35.1 pg — ABNORMAL HIGH (ref 26.0–34.0)
MCHC: 34.3 g/dL (ref 30.0–36.0)
MCV: 102.2 fL — ABNORMAL HIGH (ref 80.0–100.0)
Monocytes Absolute: 0.3 10*3/uL (ref 0.1–1.0)
Monocytes Relative: 5 %
Neutro Abs: 4.3 10*3/uL (ref 1.7–7.7)
Neutrophils Relative %: 71 %
Platelets: 155 10*3/uL (ref 150–400)
RBC: 4.05 MIL/uL (ref 3.87–5.11)
RDW: 12 % (ref 11.5–15.5)
WBC: 6 10*3/uL (ref 4.0–10.5)
nRBC: 0 % (ref 0.0–0.2)

## 2019-07-24 LAB — IRON AND TIBC
Iron: 211 ug/dL — ABNORMAL HIGH (ref 28–170)
Saturation Ratios: 57 % — ABNORMAL HIGH (ref 10.4–31.8)
TIBC: 371 ug/dL (ref 250–450)
UIBC: 160 ug/dL

## 2019-07-24 LAB — FERRITIN: Ferritin: 92 ng/mL (ref 11–307)

## 2019-07-24 LAB — VITAMIN D 25 HYDROXY (VIT D DEFICIENCY, FRACTURES): Vit D, 25-Hydroxy: 22.86 ng/mL — ABNORMAL LOW (ref 30–100)

## 2019-07-24 LAB — VITAMIN B12: Vitamin B-12: 190 pg/mL (ref 180–914)

## 2019-07-25 ENCOUNTER — Telehealth: Payer: Self-pay | Admitting: Gastroenterology

## 2019-07-25 LAB — FOLATE RBC
Folate, Hemolysate: 453 ng/mL
Folate, RBC: 1110 ng/mL (ref >498)
Hematocrit: 40.8 % (ref 34.0–46.6)

## 2019-07-25 NOTE — Telephone Encounter (Signed)
Pt seen her lab results on MyChart and doesn't understand them. Please call her at [phone]

## 2019-07-25 NOTE — Telephone Encounter (Signed)
Called pt and message has been routed to provider.. see lab results

## 2019-07-26 ENCOUNTER — Other Ambulatory Visit: Payer: Self-pay | Admitting: Emergency Medicine

## 2019-07-26 MED ORDER — CYANOCOBALAMIN 1000 MCG/ML IJ SOLN: INTRAMUSCULAR | 1 refills | Status: DC

## 2019-07-26 NOTE — Progress Notes (Signed)
b12

## 2019-07-28 ENCOUNTER — Other Ambulatory Visit: Payer: Self-pay | Admitting: Emergency Medicine

## 2019-07-28 ENCOUNTER — Ambulatory Visit (HOSPITAL_COMMUNITY): Admission: RE | Admit: 2019-07-28 | Payer: Medicaid Other | Source: Ambulatory Visit

## 2019-07-28 DIAGNOSIS — R5383 Other fatigue: Secondary | ICD-10-CM

## 2019-07-28 DIAGNOSIS — R790 Abnormal level of blood mineral: Secondary | ICD-10-CM

## 2019-07-28 NOTE — Progress Notes (Signed)
Vit d 

## 2019-07-31 ENCOUNTER — Other Ambulatory Visit: Payer: Self-pay | Admitting: Emergency Medicine

## 2019-07-31 NOTE — Progress Notes (Signed)
Pt notified orders placed  

## 2019-08-01 ENCOUNTER — Telehealth: Payer: Self-pay

## 2019-08-01 DIAGNOSIS — M545 Low back pain: Secondary | ICD-10-CM | POA: Diagnosis not present

## 2019-08-01 DIAGNOSIS — M542 Cervicalgia: Secondary | ICD-10-CM | POA: Diagnosis not present

## 2019-08-01 NOTE — Telephone Encounter (Signed)
[facility] [address] [place], [place]  RX request for Vitamin B12 tablet instead of the shot.

## 2019-08-03 ENCOUNTER — Other Ambulatory Visit: Payer: Self-pay | Admitting: Gastroenterology

## 2019-08-03 MED ORDER — VITAMIN B-12 1000 MCG PO TABS
1000.0000 ug | ORAL_TABLET | Freq: Every day | ORAL | 1 refills | Status: DC
Start: 2019-08-03 — End: 2020-02-24

## 2019-08-03 NOTE — Telephone Encounter (Signed)
Rx sent for B12 1000 mcg PO daily #30 with 1 refill

## 2019-08-04 NOTE — Telephone Encounter (Signed)
Noted  

## 2019-08-15 ENCOUNTER — Ambulatory Visit: Payer: Medicaid Other | Admitting: Gastroenterology

## 2019-09-12 ENCOUNTER — Telehealth: Payer: Self-pay | Admitting: Emergency Medicine

## 2019-09-12 NOTE — Telephone Encounter (Signed)
Called pt to notify her that [facility] to take one tablet by mouth daily for vit b12. Left vm

## 2019-09-30 ENCOUNTER — Other Ambulatory Visit: Payer: Self-pay | Admitting: Gastroenterology

## 2019-10-01 ENCOUNTER — Other Ambulatory Visit: Payer: Self-pay | Admitting: Gastroenterology

## 2019-10-31 ENCOUNTER — Other Ambulatory Visit: Payer: Self-pay

## 2019-10-31 ENCOUNTER — Encounter: Payer: Self-pay | Admitting: Nurse Practitioner

## 2019-10-31 ENCOUNTER — Ambulatory Visit: Payer: Medicaid Other | Admitting: Nurse Practitioner

## 2019-10-31 ENCOUNTER — Ambulatory Visit: Payer: Medicaid Other | Admitting: Gastroenterology

## 2019-10-31 VITALS — BP 118/80 | HR 90 | Temp 99.8°F | Ht 64.5 in | Wt 168.6 lb

## 2019-10-31 DIAGNOSIS — K743 Primary biliary cirrhosis: Secondary | ICD-10-CM

## 2019-10-31 NOTE — Progress Notes (Signed)
Patient left without being seen.

## 2020-01-02 ENCOUNTER — Other Ambulatory Visit: Payer: Self-pay | Admitting: Gastroenterology

## 2020-02-05 NOTE — Progress Notes (Signed)
Office Visit Note  Patient: Kara FleetStephanie Stewart             Date of Birth: Jun 17, 1975           MRN: [id number]             PCP: Patient, No Pcp Per Referring: Gelene MinkBoone, Anna W, NP Visit Date: 02/19/2020 Occupation: @GUAROCC @  Subjective:  Pain in multiple joints and muscles.   History of Present Illness: Kara FleetStephanie Stewart is a 44 y.o. female seen in consultation per request of her gastroenterologist.  According the patient about 2 years ago she started having increased pain in her joints.  She states she gave birth to her daughter and March 2020 and since then she has been having increased neck and lower back pain.  She also complains of discomfort in her bilateral wrists, bilateral hands, bilateral trochanteric area, bilateral knee joints and her feet.  She has noticed intermittent swelling in her hands and her feet.  She states she also have muscle spasms involving her hands and her feet.  She was seen by chiropractor about 2 months ago who did x-rays of her cervical and lumbar spine and told her that she has pinched nerves and degenerative changes.  I do not have x-rays to review.  She also gives history of constipation and diarrhea for the last few years.  She states her last colonoscopy was 3 years ago at the time she was told that she had colitis.  She continues to have diarrhea and constipation.  She has been also diagnosed with primary biliary cirrhosis.  There is no history of oral ulcers, nasal ulcers, malar rash, photosensitivity, sicca symptoms, Raynaud's phenomenon or adenopathy.  She is gravida 4, para 1, miscarriages 3.  There is no history of DVTs.  Activities of Daily Living:  Patient reports morning stiffness for all  Day.  Patient Reports nocturnal pain.  Difficulty dressing/grooming: Reports Difficulty climbing stairs: Reports Difficulty getting out of chair: Reports Difficulty using hands for taps, buttons, cutlery, and/or writing: Reports  Review of Systems  Constitutional:  Positive for fatigue.  HENT: Negative for mouth sores, mouth dryness and nose dryness.   Eyes: Negative for pain, itching and dryness.  Respiratory: Positive for shortness of breath. Negative for difficulty breathing.        Occasional due to asthma  Cardiovascular: Negative for chest pain and palpitations.  Gastrointestinal: Positive for constipation and diarrhea. Negative for blood in stool.  Endocrine: Negative for increased urination.  Genitourinary: Negative for difficulty urinating.  Musculoskeletal: Positive for arthralgias, joint pain, joint swelling, myalgias, morning stiffness, muscle tenderness and myalgias.  Skin: Negative for color change, rash, redness and sensitivity to sunlight.  Allergic/Immunologic: Negative for susceptible to infections.  Neurological: Positive for dizziness, numbness, headaches, parasthesias and memory loss.  Hematological: Positive for bruising/bleeding tendency. Negative for swollen glands.  Psychiatric/Behavioral: Positive for sleep disturbance. The patient is nervous/anxious.     PMFS History:  Patient Active Problem List   Diagnosis Date Noted  . Right sided abdominal pain 04/12/2019  . Abnormal LFTs 10/10/2018  . Unwanted fertility 09/12/2018  . 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 Chronic liver disease 11/16/2017  . History of marijuana use 01/20/2015  . Abnormal CT scan, colon 01/08/2015  . Anxiety 11/29/2014  . History of colitis 11/29/2014  . RUQ pain 08/27/2014  . Thyroid nodule 01/11/2014  . SVT (supraventricular tachycardia) (HCC) 09/07/2012  . History of alcohol use 07/21/2012  . Fatty liver 10/28/2011  Past Medical History:  Diagnosis Date  . Acid reflux   . 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 Alcoholic hepatitis without ascites   . Anxiety   . Asthma   . Colitis   . GERD (gastroesophageal reflux disease) 07/21/2012  . Hiatal hernia   . History of alcohol abuse 04/26/2015  . Hypertriglyceridemia   . Hypothyroid     . Iritis    frequent  . Ovarian cyst   . Panic attacks   . SVT (supraventricular tachycardia) (HCC)    last issue 12/2017    Family History  Problem Relation Age of Onset  . Stomach cancer Paternal Grandfather        colon cancer  . Cancer Paternal Grandfather        throat and esophagus  . Breast cancer Maternal Grandmother   . Cancer Maternal Grandmother        skin  . Anxiety disorder Maternal Grandmother   . Hypertension Mother   . Other Mother        fatty liver  . Hyperlipidemia Mother   . Liver disease Mother        fatty liver, does not drink.   . Sleep apnea Mother   . Other Father        varicose veins; stomach issues; hernia  . Hypertension Father   . Hyperlipidemia Father   . Cancer Father        prostate  . Arthritis Father        rheumatoid  . Neuropathy Father   . Rheum arthritis Father   . Thyroid disease Sister   . Hypertension Sister   . Other Brother        hernia  . Diabetes Maternal Grandfather   . Heart disease Maternal Grandfather   . Other Paternal Grandmother        hernia  . COPD Paternal Grandmother   . Diabetes Paternal Grandmother   . Healthy Daughter    Past Surgical History:  Procedure Laterality Date  . BIOPSY N/A 02/05/2015   Procedure: BIOPSY;  Surgeon: West Bali, MD;  Location: [facility];  Service: Endoscopy;  Laterality: N/A;  . COLONOSCOPY    . COLONOSCOPY WITH PROPOFOL N/A 02/05/2015   POE:UMPNT HH/mild diverticulosis  . ESOPHAGEAL DILATION N/A 11/21/2015   Procedure: ESOPHAGEAL DILATION;  Surgeon: Corbin Ade, MD;  Location: [facility];  Service: Endoscopy;  Laterality: N/A;  . ESOPHAGOGASTRODUODENOSCOPY  11/06/2011   [name]: MILD Esophagitis/PATENT ESOPHAGEAL Stricture/  Moderate gastritis. Bx no.hpylori or celiac, +gastritis  . ESOPHAGOGASTRODUODENOSCOPY (EGD) WITH PROPOFOL N/A 03/20/2014   [name]: 1. Mild esophagitis & distal esophagela stricture. 2. small hiatal hernia 3. moderate non-erosive gastritis and mild  duodenits  . ESOPHAGOGASTRODUODENOSCOPY (EGD) WITH PROPOFOL N/A 11/21/2015   Dr. Jena Gauss: LA grade B esophagitis, MW tear likely source of hematemesis  . LAPAROSCOPIC TUBAL LIGATION Bilateral 09/14/2018   Procedure: LAPAROSCOPIC TUBAL LIGATION;  Surgeon: Allie Bossier, MD;  Location: [facility];  Service: Gynecology;  Laterality: Bilateral;  . SAVORY DILATION N/A 03/20/2014   Procedure: SAVORY DILATION;  Surgeon: West Bali, MD;  Location: [facility];  Service: Endoscopy;  Laterality: N/A;  dilated with # 12.8, 14,15,16  . TOOTH EXTRACTION     Social History   Social History Narrative   Lives alone with a roommate. Dating and in safe relationship. Does not smoke. Denies drug use.    Previous MD: Phill Mutter, NP ([facility] Fears, [place])      Immunization History  Administered Date(s) Administered  . Influenza-Unspecified 01/12/2014     Objective: Vital Signs: BP 123/86 (BP Location: Right Arm, Patient Position: Sitting, Cuff Size: Normal)   Pulse 96   Resp 15   Ht  (1.6 m)   Wt 161 lb (73 kg)   BMI 28.52 kg/m    Physical Exam Vitals and nursing note reviewed.  Constitutional:      Appearance: She is well-developed.  HENT:     Head: Normocephalic and atraumatic.  Eyes:     Conjunctiva/sclera: Conjunctivae normal.  Cardiovascular:     Rate and Rhythm: Normal rate and regular rhythm.     Heart sounds: Normal heart sounds.  Pulmonary:     Effort: Pulmonary effort is normal.     Breath sounds: Normal breath sounds.  Abdominal:     General: Bowel sounds are normal.     Palpations: Abdomen is soft.  Musculoskeletal:     Cervical back: Normal range of motion.  Lymphadenopathy:     Cervical: No cervical adenopathy.  Skin:    General: Skin is warm and dry.     Capillary Refill: Capillary refill takes less than 2 seconds.  Neurological:     Mental Status: She is alert and oriented to person, place, and time.  Psychiatric:        Behavior: Behavior normal.       Musculoskeletal Exam: She has discomfort range of motion of the cervical thoracic and lumbar spine.  She had limited range of motion of her entire spine.  She had tenderness on palpation over SI joints.  Shoulder joints, elbow joints, wrist joints, MCPs PIPs and DIPs with good range of motion with no synovitis.  She had discomfort range of motion of bilateral hip joints mostly over the trochanteric area.  Knee joints, ankle joints, MTPs and PIPs with good range of motion with no synovitis.  She had generalized hyperalgesia and positive tender points.  CDAI Exam: CDAI Score: -- Patient Global: --; Provider Global: -- Swollen: --; Tender: -- Joint Exam 02/19/2020   No joint exam has been documented for this visit   There is currently no information documented on the homunculus. Go to the Rheumatology activity and complete the homunculus joint exam.  Investigation: No additional findings.  Imaging: XR Foot 2 Views Left  Result Date: 02/19/2020 PIP and DIP narrowing was noted.  No MTP, intertarsal or tibiotalar joint space narrowing was noted.  No erosive changes were noted. Impression: Mild osteoarthritic changes were noted.  XR Foot 2 Views Right  Result Date: 02/19/2020 PIP and DIP narrowing was noted.  No MTP, intertarsal or tibiotalar joint space narrowing was noted.  No erosive changes were noted. Impression: Mild osteoarthritic changes were noted.  XR Hand 2 View Left  Result Date: 02/19/2020 Mild PIP narrowing was noted.  No MCP, intercarpal or radiocarpal joint space narrowing was noted.  No erosive changes were noted. Impression: These findings are consistent with early osteoarthritic changes.  XR Hand 2 View Right  Result Date: 02/19/2020 Mild PIP narrowing was noted.  No MCP, intercarpal or radiocarpal joint space narrowing was noted.  No erosive changes were noted. Impression: These findings are consistent with early osteoarthritic changes.  XR KNEE 3 VIEW  LEFT  Result Date: 02/19/2020 No medial or lateral compartment narrowing was noted.  Mild patellofemoral narrowing was noted.  No chondrocalcinosis was noted. 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  XR KNEE 3 VIEW RIGHT  Result  Date: 02/19/2020 No medial lateral compartment narrowing was noted.  Patellofemoral narrowing was noted.  No chondrocalcinosis was noted. Impression: Unremarkable x-ray of the knee joint.  XR Pelvis 1-2 Views  Result Date: 02/19/2020 No SI joint sclerosis or narrowing was noted.  Bilateral hip joint x-rays were unremarkable. Impression: Unremarkable x-ray of the SI joints and the hip joints.   Recent Labs: Lab Results  Component Value Date   WBC 6.0 07/24/2019   HGB 14.2 07/24/2019   PLT 155 07/24/2019   NA 136 05/03/2019   K 3.9 05/03/2019   CL 103 05/03/2019   CO2 25 05/03/2019   GLUCOSE 98 05/03/2019   BUN 7 05/03/2019   CREATININE 0.81 05/03/2019   BILITOT 1.7 (H) 05/03/2019   ALKPHOS 77 05/03/2019   AST 24 05/03/2019   ALT 24 05/03/2019   PROT 7.0 05/03/2019   ALBUMIN 3.9 05/03/2019   CALCIUM 8.9 05/03/2019   GFRAA >60 05/03/2019    Speciality Comments: No specialty comments available.  Procedures:  No procedures performed Allergies: Penicillins and Lidocaine   Assessment / Plan:     Visit Diagnoses: Polyarthralgia - 05/03/19: ANA-, mitochondrial M2 Ab 41.8, F-actin IgG 6, tissue transgluatminase ab-, IgG 789, IgA 227, IgM 187.  Patient complains of pain and discomfort in multiple joints.  No synovitis was noted.  I will obtain x-rays and labs today.  Pain in both hands -patient complains of pain and discomfort in her bilateral hands.  She states the pain is severe and she has joint swelling.  No synovitis was noted.  Plan: XR Hand 2 View Right, XR Hand 2 View Left.  X-ray showed mild osteoarthritic changes.  Chronic pain of both knees -she complains of pain and discomfort in her bilateral  knee joints.  No warmth swelling or effusion was noted.  Plan: XR KNEE 3 VIEW RIGHT, XR KNEE 3 VIEW LEFT.  Mild chondromalacia patella was noted.  Pain in both feet -she complains of pain in her bilateral feet.  No synovitis was noted.  Plan: XR Foot 2 Views Right, XR Foot 2 Views Left.  Mild osteoarthritic changes were noted.  Chronic SI joint pain -she complains of bilateral SI joint.  Plan: XR Pelvis 1-2 Views.  X-ray of bilateral hip joints and SI joints were unremarkable.  Neck pain-patient complains of severe neck pain.  She was evaluated by a chiropractor about 2 months ago and had extensive work-up.  Have advised her to bring x-rays at the next visit.  She also tried physical therapy and could not tolerate the PT due to discomfort.  Lower back pain-she complains of severe lower back pain.  She was evaluated by the chiropractor.  According the patient she has pinched nerve and degenerative changes.  Have advised her to bring the x-rays next visit.  She tried physical therapy but had to discontinue due to discomfort.  Myalgia-she has generalized pain, hyperalgesia and positive tender points.  It raises concern regarding fibromyalgia.  Need for regular exercise was emphasized.  Other fatigue-she complains of extreme fatigue for a long time.  Other insomnia-she has chronic insomnia.  She states she is not comfortable at night.  Vitamin D deficiency-she is currently on vitamin D supplement.  Other medical problems are listed as follows:  SVT (supraventricular tachycardia) (HCC)  Fatty liver  Primary biliary cirrhosis (HCC)  History of colitis-no specific diagnosis was found.  History of anxiety  Thyroid nodule  History of alcohol use - quit drinking 2018  History of marijuana  use - occasinal use per patient  Family history of rheumatoid arthritis - father and MGM  Status post tubal ligation  Orders: Orders Placed This Encounter  Procedures  . XR Hand 2 View Right  . XR  Hand 2 View Left  . XR KNEE 3 VIEW RIGHT  . XR KNEE 3 VIEW LEFT  . XR Foot 2 Views Right  . XR Foot 2 Views Left  . XR Pelvis 1-2 Views  . CK  . Sedimentation rate  . Rheumatoid factor  . Cyclic citrul peptide antibody, IgG  . Serum protein electrophoresis with reflex   No orders of the defined types were placed in this encounter.   Follow-Up Instructions: Return for Polyarthralgia.   Pollyann Savoy, MD  Note - This record has been created using Animal nutritionist.  Chart creation errors have been sought, but may not always  have been located. Such creation errors do not reflect on  the standard of medical care.

## 2020-02-16 ENCOUNTER — Other Ambulatory Visit: Payer: Self-pay | Admitting: Gastroenterology

## 2020-02-19 ENCOUNTER — Ambulatory Visit: Payer: Self-pay

## 2020-02-19 ENCOUNTER — Encounter: Payer: Self-pay | Admitting: Rheumatology

## 2020-02-19 ENCOUNTER — Ambulatory Visit: Payer: Medicaid Other | Admitting: Rheumatology

## 2020-02-19 ENCOUNTER — Other Ambulatory Visit: Payer: Self-pay

## 2020-02-19 VITALS — BP 123/86 | HR 96 | Resp 15 | Ht 63.0 in | Wt 161.0 lb

## 2020-02-19 DIAGNOSIS — R5383 Other fatigue: Secondary | ICD-10-CM | POA: Diagnosis not present

## 2020-02-19 DIAGNOSIS — M25562 Pain in left knee: Secondary | ICD-10-CM | POA: Diagnosis not present

## 2020-02-19 DIAGNOSIS — M79641 Pain in right hand: Secondary | ICD-10-CM | POA: Diagnosis not present

## 2020-02-19 DIAGNOSIS — Z8261 Family history of arthritis: Secondary | ICD-10-CM

## 2020-02-19 DIAGNOSIS — G8929 Other chronic pain: Secondary | ICD-10-CM

## 2020-02-19 DIAGNOSIS — Z8659 Personal history of other mental and behavioral disorders: Secondary | ICD-10-CM

## 2020-02-19 DIAGNOSIS — Z9851 Tubal ligation status: Secondary | ICD-10-CM

## 2020-02-19 DIAGNOSIS — E041 Nontoxic single thyroid nodule: Secondary | ICD-10-CM

## 2020-02-19 DIAGNOSIS — K743 Primary biliary cirrhosis: Secondary | ICD-10-CM | POA: Diagnosis not present

## 2020-02-19 DIAGNOSIS — M79671 Pain in right foot: Secondary | ICD-10-CM | POA: Diagnosis not present

## 2020-02-19 DIAGNOSIS — Z87898 Personal history of other specified conditions: Secondary | ICD-10-CM

## 2020-02-19 DIAGNOSIS — I471 Supraventricular tachycardia: Secondary | ICD-10-CM | POA: Diagnosis not present

## 2020-02-19 DIAGNOSIS — F1291 Cannabis use, unspecified, in remission: Secondary | ICD-10-CM

## 2020-02-19 DIAGNOSIS — G4709 Other insomnia: Secondary | ICD-10-CM | POA: Diagnosis not present

## 2020-02-19 DIAGNOSIS — M79672 Pain in left foot: Secondary | ICD-10-CM

## 2020-02-19 DIAGNOSIS — M545 Low back pain, unspecified: Secondary | ICD-10-CM

## 2020-02-19 DIAGNOSIS — E559 Vitamin D deficiency, unspecified: Secondary | ICD-10-CM

## 2020-02-19 DIAGNOSIS — M542 Cervicalgia: Secondary | ICD-10-CM | POA: Diagnosis not present

## 2020-02-19 DIAGNOSIS — M791 Myalgia, unspecified site: Secondary | ICD-10-CM

## 2020-02-19 DIAGNOSIS — M25561 Pain in right knee: Secondary | ICD-10-CM | POA: Diagnosis not present

## 2020-02-19 DIAGNOSIS — M79642 Pain in left hand: Secondary | ICD-10-CM | POA: Diagnosis not present

## 2020-02-19 DIAGNOSIS — M533 Sacrococcygeal disorders, not elsewhere classified: Secondary | ICD-10-CM

## 2020-02-19 DIAGNOSIS — M2550 Pain in unspecified joint: Secondary | ICD-10-CM | POA: Diagnosis not present

## 2020-02-19 DIAGNOSIS — K760 Fatty (change of) liver, not elsewhere classified: Secondary | ICD-10-CM

## 2020-02-19 DIAGNOSIS — Z8719 Personal history of other diseases of the digestive system: Secondary | ICD-10-CM

## 2020-02-21 LAB — PROTEIN ELECTROPHORESIS, SERUM, WITH REFLEX
Albumin ELP: 3.8 g/dL (ref 3.8–4.8)
Alpha 1: 0.4 g/dL — ABNORMAL HIGH (ref 0.2–0.3)
Alpha 2: 0.6 g/dL (ref 0.5–0.9)
Beta 2: 0.3 g/dL (ref 0.2–0.5)
Beta Globulin: 0.4 g/dL (ref 0.4–0.6)
Gamma Globulin: 1.1 g/dL (ref 0.8–1.7)
Total Protein: 6.7 g/dL (ref 6.1–8.1)

## 2020-02-21 LAB — SEDIMENTATION RATE: Sed Rate: 2 mm/h (ref 0–20)

## 2020-02-21 LAB — CYCLIC CITRUL PEPTIDE ANTIBODY, IGG: Cyclic Citrullin Peptide Ab: <16 UNITS

## 2020-02-21 LAB — RHEUMATOID FACTOR: Rheumatoid fact SerPl-aCnc: <14 IU/mL (ref <14)

## 2020-02-21 LAB — CK: Total CK: 44 U/L (ref 29–143)

## 2020-02-23 ENCOUNTER — Other Ambulatory Visit: Payer: Self-pay

## 2020-02-23 ENCOUNTER — Ambulatory Visit: Payer: Medicaid Other

## 2020-02-23 ENCOUNTER — Inpatient Hospital Stay (HOSPITAL_COMMUNITY)
Admission: EM | Admit: 2020-02-23 | Discharge: 2020-03-03 | DRG: 799 | Disposition: A | Discharge status: Home / Self Care | Payer: Medicaid Other | Source: Ambulatory Visit | Attending: General Surgery | Admitting: General Surgery

## 2020-02-23 ENCOUNTER — Emergency Department (HOSPITAL_COMMUNITY): Payer: Medicaid Other | Admitting: Anesthesiology

## 2020-02-23 ENCOUNTER — Ambulatory Visit
Admission: EM | Admit: 2020-02-23 | Discharge: 2020-02-23 | Disposition: A | Discharge status: Home / Self Care | Payer: Medicaid Other | Attending: Emergency Medicine | Admitting: Emergency Medicine

## 2020-02-23 ENCOUNTER — Encounter (HOSPITAL_COMMUNITY): Admission: EM | Disposition: A | Discharge status: Home / Self Care | Payer: Self-pay | Source: Ambulatory Visit

## 2020-02-23 ENCOUNTER — Emergency Department (HOSPITAL_COMMUNITY): Payer: Medicaid Other

## 2020-02-23 ENCOUNTER — Ambulatory Visit (INDEPENDENT_AMBULATORY_CARE_PROVIDER_SITE_OTHER): Payer: Medicaid Other

## 2020-02-23 ENCOUNTER — Encounter (HOSPITAL_COMMUNITY): Payer: Self-pay

## 2020-02-23 DIAGNOSIS — Z79899 Other long term (current) drug therapy: Secondary | ICD-10-CM

## 2020-02-23 DIAGNOSIS — Z8261 Family history of arthritis: Secondary | ICD-10-CM

## 2020-02-23 DIAGNOSIS — I471 Supraventricular tachycardia: Secondary | ICD-10-CM | POA: Diagnosis present

## 2020-02-23 DIAGNOSIS — S59919A Unspecified injury of unspecified forearm, initial encounter: Secondary | ICD-10-CM | POA: Diagnosis not present

## 2020-02-23 DIAGNOSIS — Z825 Family history of asthma and other chronic lower respiratory diseases: Secondary | ICD-10-CM

## 2020-02-23 DIAGNOSIS — S59912A Unspecified injury of left forearm, initial encounter: Secondary | ICD-10-CM

## 2020-02-23 DIAGNOSIS — K661 Hemoperitoneum: Secondary | ICD-10-CM | POA: Diagnosis not present

## 2020-02-23 DIAGNOSIS — E781 Pure hyperglyceridemia: Secondary | ICD-10-CM | POA: Diagnosis present

## 2020-02-23 DIAGNOSIS — K859 Acute pancreatitis without necrosis or infection, unspecified: Secondary | ICD-10-CM | POA: Diagnosis not present

## 2020-02-23 DIAGNOSIS — K2100 Gastro-esophageal reflux disease with esophagitis, without bleeding: Secondary | ICD-10-CM | POA: Diagnosis present

## 2020-02-23 DIAGNOSIS — S2232XA Fracture of one rib, left side, initial encounter for closed fracture: Secondary | ICD-10-CM | POA: Diagnosis not present

## 2020-02-23 DIAGNOSIS — K5790 Diverticulosis of intestine, part unspecified, without perforation or abscess without bleeding: Secondary | ICD-10-CM | POA: Diagnosis present

## 2020-02-23 DIAGNOSIS — Z803 Family history of malignant neoplasm of breast: Secondary | ICD-10-CM

## 2020-02-23 DIAGNOSIS — F410 Panic disorder [episodic paroxysmal anxiety] without agoraphobia: Secondary | ICD-10-CM | POA: Diagnosis present

## 2020-02-23 DIAGNOSIS — K7581 Nonalcoholic steatohepatitis (NASH): Secondary | ICD-10-CM | POA: Diagnosis not present

## 2020-02-23 DIAGNOSIS — S3991XA Unspecified injury of abdomen, initial encounter: Secondary | ICD-10-CM | POA: Diagnosis not present

## 2020-02-23 DIAGNOSIS — F419 Anxiety disorder, unspecified: Secondary | ICD-10-CM | POA: Diagnosis present

## 2020-02-23 DIAGNOSIS — I81 Portal vein thrombosis: Secondary | ICD-10-CM | POA: Diagnosis not present

## 2020-02-23 DIAGNOSIS — Z800 Family history of malignant neoplasm of digestive organs: Secondary | ICD-10-CM

## 2020-02-23 DIAGNOSIS — W19XXXA Unspecified fall, initial encounter: Secondary | ICD-10-CM | POA: Diagnosis present

## 2020-02-23 DIAGNOSIS — S36032A Major laceration of spleen, initial encounter: Secondary | ICD-10-CM | POA: Diagnosis not present

## 2020-02-23 DIAGNOSIS — R079 Chest pain, unspecified: Secondary | ICD-10-CM | POA: Diagnosis not present

## 2020-02-23 DIAGNOSIS — J45909 Unspecified asthma, uncomplicated: Secondary | ICD-10-CM | POA: Diagnosis present

## 2020-02-23 DIAGNOSIS — Z20822 Contact with and (suspected) exposure to covid-19: Secondary | ICD-10-CM | POA: Diagnosis not present

## 2020-02-23 DIAGNOSIS — S52302A Unspecified fracture of shaft of left radius, initial encounter for closed fracture: Secondary | ICD-10-CM | POA: Diagnosis not present

## 2020-02-23 DIAGNOSIS — K7010 Alcoholic hepatitis without ascites: Secondary | ICD-10-CM | POA: Diagnosis present

## 2020-02-23 DIAGNOSIS — K567 Ileus, unspecified: Secondary | ICD-10-CM

## 2020-02-23 DIAGNOSIS — R4020 Unspecified coma: Secondary | ICD-10-CM

## 2020-02-23 DIAGNOSIS — E039 Hypothyroidism, unspecified: Secondary | ICD-10-CM | POA: Diagnosis present

## 2020-02-23 DIAGNOSIS — F1290 Cannabis use, unspecified, uncomplicated: Secondary | ICD-10-CM | POA: Diagnosis present

## 2020-02-23 DIAGNOSIS — R0902 Hypoxemia: Secondary | ICD-10-CM | POA: Diagnosis not present

## 2020-02-23 DIAGNOSIS — Z23 Encounter for immunization: Secondary | ICD-10-CM | POA: Diagnosis not present

## 2020-02-23 DIAGNOSIS — S52502A Unspecified fracture of the lower end of left radius, initial encounter for closed fracture: Secondary | ICD-10-CM

## 2020-02-23 DIAGNOSIS — R1110 Vomiting, unspecified: Secondary | ICD-10-CM | POA: Diagnosis not present

## 2020-02-23 DIAGNOSIS — K743 Primary biliary cirrhosis: Secondary | ICD-10-CM | POA: Diagnosis present

## 2020-02-23 DIAGNOSIS — J9811 Atelectasis: Secondary | ICD-10-CM | POA: Diagnosis not present

## 2020-02-23 DIAGNOSIS — R296 Repeated falls: Secondary | ICD-10-CM | POA: Diagnosis present

## 2020-02-23 DIAGNOSIS — Z560 Unemployment, unspecified: Secondary | ICD-10-CM

## 2020-02-23 DIAGNOSIS — K760 Fatty (change of) liver, not elsewhere classified: Secondary | ICD-10-CM | POA: Diagnosis present

## 2020-02-23 DIAGNOSIS — Z83438 Family history of other disorder of lipoprotein metabolism and other lipidemia: Secondary | ICD-10-CM

## 2020-02-23 DIAGNOSIS — R0602 Shortness of breath: Secondary | ICD-10-CM | POA: Diagnosis not present

## 2020-02-23 DIAGNOSIS — R531 Weakness: Secondary | ICD-10-CM | POA: Diagnosis not present

## 2020-02-23 DIAGNOSIS — R55 Syncope and collapse: Secondary | ICD-10-CM | POA: Diagnosis not present

## 2020-02-23 DIAGNOSIS — F17210 Nicotine dependence, cigarettes, uncomplicated: Secondary | ICD-10-CM | POA: Diagnosis present

## 2020-02-23 DIAGNOSIS — K9189 Other postprocedural complications and disorders of digestive system: Secondary | ICD-10-CM | POA: Diagnosis not present

## 2020-02-23 DIAGNOSIS — S5292XA Unspecified fracture of left forearm, initial encounter for closed fracture: Secondary | ICD-10-CM | POA: Diagnosis not present

## 2020-02-23 DIAGNOSIS — Z8249 Family history of ischemic heart disease and other diseases of the circulatory system: Secondary | ICD-10-CM

## 2020-02-23 DIAGNOSIS — D62 Acute posthemorrhagic anemia: Secondary | ICD-10-CM | POA: Diagnosis not present

## 2020-02-23 DIAGNOSIS — Z833 Family history of diabetes mellitus: Secondary | ICD-10-CM

## 2020-02-23 DIAGNOSIS — F1010 Alcohol abuse, uncomplicated: Secondary | ICD-10-CM | POA: Diagnosis present

## 2020-02-23 DIAGNOSIS — Z8349 Family history of other endocrine, nutritional and metabolic diseases: Secondary | ICD-10-CM

## 2020-02-23 DIAGNOSIS — N83201 Unspecified ovarian cyst, right side: Secondary | ICD-10-CM | POA: Diagnosis present

## 2020-02-23 DIAGNOSIS — Z880 Allergy status to penicillin: Secondary | ICD-10-CM | POA: Diagnosis not present

## 2020-02-23 DIAGNOSIS — I82890 Acute embolism and thrombosis of other specified veins: Secondary | ICD-10-CM | POA: Diagnosis not present

## 2020-02-23 DIAGNOSIS — S36039A Unspecified laceration of spleen, initial encounter: Secondary | ICD-10-CM | POA: Diagnosis not present

## 2020-02-23 DIAGNOSIS — I10 Essential (primary) hypertension: Secondary | ICD-10-CM | POA: Diagnosis not present

## 2020-02-23 DIAGNOSIS — S36031A Moderate laceration of spleen, initial encounter: Secondary | ICD-10-CM | POA: Diagnosis not present

## 2020-02-23 DIAGNOSIS — R0789 Other chest pain: Secondary | ICD-10-CM | POA: Diagnosis not present

## 2020-02-23 HISTORY — PX: SPLENECTOMY, TOTAL: SHX788

## 2020-02-23 HISTORY — PX: LIVER BIOPSY: SHX301

## 2020-02-23 LAB — COMPREHENSIVE METABOLIC PANEL
ALT: 67 U/L — ABNORMAL HIGH (ref 0–44)
AST: 116 U/L — ABNORMAL HIGH (ref 15–41)
Albumin: 3.5 g/dL (ref 3.5–5.0)
Alkaline Phosphatase: 73 U/L (ref 38–126)
Anion gap: 14 (ref 5–15)
BUN: 6 mg/dL (ref 6–20)
CO2: 20 mmol/L — ABNORMAL LOW (ref 22–32)
Calcium: 8.5 mg/dL — ABNORMAL LOW (ref 8.9–10.3)
Chloride: 104 mmol/L (ref 98–111)
Creatinine, Ser: 0.85 mg/dL (ref 0.44–1.00)
GFR, Estimated: >60 mL/min (ref >60)
Glucose, Bld: 182 mg/dL — ABNORMAL HIGH (ref 70–99)
Potassium: 3.6 mmol/L (ref 3.5–5.1)
Sodium: 138 mmol/L (ref 135–145)
Total Bilirubin: 1.5 mg/dL — ABNORMAL HIGH (ref 0.3–1.2)
Total Protein: 6.1 g/dL — ABNORMAL LOW (ref 6.5–8.1)

## 2020-02-23 LAB — BASIC METABOLIC PANEL
Anion gap: 13 (ref 5–15)
BUN: 6 mg/dL (ref 6–20)
CO2: 18 mmol/L — ABNORMAL LOW (ref 22–32)
Calcium: 7.1 mg/dL — ABNORMAL LOW (ref 8.9–10.3)
Chloride: 107 mmol/L (ref 98–111)
Creatinine, Ser: 0.99 mg/dL (ref 0.44–1.00)
GFR, Estimated: >60 mL/min (ref >60)
Glucose, Bld: 174 mg/dL — ABNORMAL HIGH (ref 70–99)
Potassium: 4.2 mmol/L (ref 3.5–5.1)
Sodium: 138 mmol/L (ref 135–145)

## 2020-02-23 LAB — CBC WITH DIFFERENTIAL/PLATELET
Abs Immature Granulocytes: 0.05 10*3/uL (ref 0.00–0.07)
Basophils Absolute: 0.1 10*3/uL (ref 0.0–0.1)
Basophils Relative: 0 %
Eosinophils Absolute: 0.3 10*3/uL (ref 0.0–0.5)
Eosinophils Relative: 2 %
HCT: 36.2 % (ref 36.0–46.0)
Hemoglobin: 12.9 g/dL (ref 12.0–15.0)
Immature Granulocytes: 0 %
Lymphocytes Relative: 7 %
Lymphs Abs: 1.1 10*3/uL (ref 0.7–4.0)
MCH: 37.8 pg — ABNORMAL HIGH (ref 26.0–34.0)
MCHC: 35.6 g/dL (ref 30.0–36.0)
MCV: 106.2 fL — ABNORMAL HIGH (ref 80.0–100.0)
Monocytes Absolute: 0.8 10*3/uL (ref 0.1–1.0)
Monocytes Relative: 5 %
Neutro Abs: 14 10*3/uL — ABNORMAL HIGH (ref 1.7–7.7)
Neutrophils Relative %: 86 %
Platelets: 205 10*3/uL (ref 150–400)
RBC: 3.41 MIL/uL — ABNORMAL LOW (ref 3.87–5.11)
RDW: 12.7 % (ref 11.5–15.5)
WBC: 16.2 10*3/uL — ABNORMAL HIGH (ref 4.0–10.5)
nRBC: 0 % (ref 0.0–0.2)

## 2020-02-23 LAB — CBC
HCT: 36.7 % (ref 36.0–46.0)
Hemoglobin: 12.5 g/dL (ref 12.0–15.0)
MCH: 33.2 pg (ref 26.0–34.0)
MCHC: 34.1 g/dL (ref 30.0–36.0)
MCV: 97.3 fL (ref 80.0–100.0)
Platelets: 83 10*3/uL — ABNORMAL LOW (ref 150–400)
RBC: 3.77 MIL/uL — ABNORMAL LOW (ref 3.87–5.11)
RDW: 20.7 % — ABNORMAL HIGH (ref 11.5–15.5)
WBC: 21.7 10*3/uL — ABNORMAL HIGH (ref 4.0–10.5)
nRBC: 0.1 % (ref 0.0–0.2)

## 2020-02-23 LAB — RESP PANEL BY RT-PCR (FLU A&B, COVID) ARPGX2
Influenza A by PCR: NEGATIVE
Influenza B by PCR: NEGATIVE
SARS Coronavirus 2 by RT PCR: NEGATIVE

## 2020-02-23 LAB — CBG MONITORING, ED: Glucose-Capillary: 185 mg/dL — ABNORMAL HIGH (ref 70–99)

## 2020-02-23 LAB — LACTIC ACID, PLASMA
Lactic Acid, Venous: 3.3 mmol/L (ref 0.5–1.9)
Lactic Acid, Venous: 2 mmol/L (ref 0.5–1.9)

## 2020-02-23 LAB — PROTIME-INR
INR: 1.1 (ref 0.8–1.2)
INR: 1.2 (ref 0.8–1.2)
Prothrombin Time: 14 seconds (ref 11.4–15.2)
Prothrombin Time: 14.7 seconds (ref 11.4–15.2)

## 2020-02-23 LAB — TROPONIN I (HIGH SENSITIVITY)
Troponin I (High Sensitivity): 2 ng/L (ref <18)
Troponin I (High Sensitivity): <2 ng/L (ref <18)

## 2020-02-23 LAB — BRAIN NATRIURETIC PEPTIDE: B Natriuretic Peptide: 54 pg/mL (ref 0.0–100.0)

## 2020-02-23 LAB — HIV ANTIBODY (ROUTINE TESTING W REFLEX): HIV Screen 4th Generation wRfx: NONREACTIVE

## 2020-02-23 LAB — HCG, QUANTITATIVE, PREGNANCY: hCG, Beta Chain, Quant, S: <1 m[IU]/mL (ref <5)

## 2020-02-23 LAB — PREPARE RBC (CROSSMATCH)

## 2020-02-23 LAB — APTT: aPTT: 25 seconds (ref 24–36)

## 2020-02-23 LAB — POCT FASTING CBG KUC MANUAL ENTRY: POCT Glucose (KUC): 186 mg/dL — AB (ref 70–99)

## 2020-02-23 SURGERY — SPLENECTOMY
Anesthesia: General

## 2020-02-23 MED ORDER — CHLORHEXIDINE GLUCONATE 0.12 % MT SOLN
15.0000 mL | Freq: Once | OROMUCOSAL | Status: AC
  Administered 2020-02-23: 15 mL via OROMUCOSAL
  Filled 2020-02-23: qty 15

## 2020-02-23 MED ORDER — ONDANSETRON HCL 4 MG/2ML IJ SOLN: 4.0000 mg | Freq: Once | INTRAMUSCULAR | Status: DC | PRN

## 2020-02-23 MED ORDER — HYDROMORPHONE HCL 1 MG/ML IJ SOLN
INTRAMUSCULAR | Status: AC
  Administered 2020-02-23: 0.5 mg via INTRAVENOUS
  Filled 2020-02-23: qty 1

## 2020-02-23 MED ORDER — MENINGOCOCCAL A C Y&W-135 OLIG IM SOLR
0.5000 mL | INTRAMUSCULAR | Status: AC | PRN
  Administered 2020-03-03: 0.5 mL via INTRAMUSCULAR
  Filled 2020-02-23 (×3): qty 0.5

## 2020-02-23 MED ORDER — SODIUM CHLORIDE 0.9% FLUSH: 9.0000 mL | INTRAVENOUS | Status: DC | PRN

## 2020-02-23 MED ORDER — SUGAMMADEX SODIUM 200 MG/2ML IV SOLN
INTRAVENOUS | Status: DC | PRN
  Administered 2020-02-23: 150 mg via INTRAVENOUS

## 2020-02-23 MED ORDER — DIPHENHYDRAMINE HCL 50 MG/ML IJ SOLN: 12.5000 mg | Freq: Four times a day (QID) | INTRAMUSCULAR | Status: DC | PRN

## 2020-02-23 MED ORDER — PHENYLEPHRINE 40 MCG/ML (10ML) SYRINGE FOR IV PUSH (FOR BLOOD PRESSURE SUPPORT)
PREFILLED_SYRINGE | INTRAVENOUS | Status: DC | PRN
  Administered 2020-02-23: 40 ug via INTRAVENOUS
  Administered 2020-02-23: 80 ug via INTRAVENOUS

## 2020-02-23 MED ORDER — PHENYLEPHRINE HCL-NACL 10-0.9 MG/250ML-% IV SOLN
INTRAVENOUS | Status: DC | PRN
  Administered 2020-02-23: 25 ug/min via INTRAVENOUS
  Administered 2020-02-23: 50 ug/min via INTRAVENOUS

## 2020-02-23 MED ORDER — HYDROMORPHONE 1 MG/ML IV SOLN
INTRAVENOUS | Status: AC
  Administered 2020-02-23: 30 mg via INTRAVENOUS
  Filled 2020-02-23: qty 30

## 2020-02-23 MED ORDER — CIPROFLOXACIN IN D5W 400 MG/200ML IV SOLN
400.0000 mg | Freq: Once | INTRAVENOUS | Status: AC
  Administered 2020-02-23: 400 mg via INTRAVENOUS

## 2020-02-23 MED ORDER — ONDANSETRON HCL 4 MG/2ML IJ SOLN
INTRAMUSCULAR | Status: DC | PRN
  Administered 2020-02-23: 4 mg via INTRAVENOUS

## 2020-02-23 MED ORDER — ONDANSETRON HCL 4 MG/2ML IJ SOLN
4.0000 mg | Freq: Once | INTRAMUSCULAR | Status: AC
  Administered 2020-02-23: 4 mg via INTRAVENOUS
  Filled 2020-02-23: qty 2

## 2020-02-23 MED ORDER — HYDROMORPHONE HCL 1 MG/ML IJ SOLN
0.2500 mg | INTRAMUSCULAR | Status: DC | PRN
  Administered 2020-02-23 (×2): 0.5 mg via INTRAVENOUS

## 2020-02-23 MED ORDER — DEXAMETHASONE SODIUM PHOSPHATE 4 MG/ML IJ SOLN
INTRAMUSCULAR | Status: DC | PRN
  Administered 2020-02-23: 4 mg via INTRAVENOUS

## 2020-02-23 MED ORDER — CHLORHEXIDINE GLUCONATE CLOTH 2 % EX PADS: 6.0000 | MEDICATED_PAD | Freq: Every day | CUTANEOUS | Status: DC

## 2020-02-23 MED ORDER — DIPHENHYDRAMINE HCL 12.5 MG/5ML PO ELIX
12.5000 mg | ORAL_SOLUTION | Freq: Four times a day (QID) | ORAL | Status: DC | PRN
  Filled 2020-02-23: qty 5

## 2020-02-23 MED ORDER — PROPOFOL 10 MG/ML IV BOLUS
INTRAVENOUS | Status: DC | PRN
  Administered 2020-02-23: 150 mg via INTRAVENOUS

## 2020-02-23 MED ORDER — SODIUM CHLORIDE 0.9% IV SOLUTION
Freq: Once | INTRAVENOUS | Status: AC
  Administered 2020-02-23: 1000 mL via INTRAVENOUS

## 2020-02-23 MED ORDER — ONDANSETRON HCL 4 MG/2ML IJ SOLN
4.0000 mg | Freq: Four times a day (QID) | INTRAMUSCULAR | Status: DC | PRN
  Administered 2020-02-23 – 2020-03-03 (×6): 4 mg via INTRAVENOUS
  Filled 2020-02-23 (×6): qty 2

## 2020-02-23 MED ORDER — FENTANYL CITRATE (PF) 100 MCG/2ML IJ SOLN
INTRAMUSCULAR | Status: DC | PRN
  Administered 2020-02-23 (×4): 50 ug via INTRAVENOUS

## 2020-02-23 MED ORDER — HEMOSTATIC AGENTS (NO CHARGE) OPTIME
TOPICAL | Status: DC | PRN
  Administered 2020-02-23: 1 via TOPICAL

## 2020-02-23 MED ORDER — HYDROMORPHONE HCL 1 MG/ML IJ SOLN
INTRAMUSCULAR | Status: AC
  Filled 2020-02-23: qty 1

## 2020-02-23 MED ORDER — 0.9 % SODIUM CHLORIDE (POUR BTL) OPTIME
TOPICAL | Status: DC | PRN
  Administered 2020-02-23: 2000 mL
  Administered 2020-02-23: 1000 mL

## 2020-02-23 MED ORDER — IOHEXOL 350 MG/ML SOLN
100.0000 mL | Freq: Once | INTRAVENOUS | Status: AC | PRN
  Administered 2020-02-23: 100 mL via INTRAVENOUS

## 2020-02-23 MED ORDER — LORAZEPAM 2 MG/ML IJ SOLN
0.5000 mg | Freq: Four times a day (QID) | INTRAMUSCULAR | Status: DC | PRN
  Administered 2020-02-24 – 2020-03-03 (×8): 0.5 mg via INTRAVENOUS
  Filled 2020-02-23 (×8): qty 1

## 2020-02-23 MED ORDER — MORPHINE SULFATE (PF) 4 MG/ML IV SOLN
4.0000 mg | Freq: Once | INTRAVENOUS | Status: AC
  Administered 2020-02-23: 4 mg via INTRAVENOUS
  Filled 2020-02-23: qty 1

## 2020-02-23 MED ORDER — LACTATED RINGERS IV SOLN: INTRAVENOUS | Status: DC

## 2020-02-23 MED ORDER — ONDANSETRON 4 MG PO TBDP
4.0000 mg | ORAL_TABLET | Freq: Four times a day (QID) | ORAL | Status: DC | PRN
  Administered 2020-02-26 – 2020-03-02 (×4): 4 mg via ORAL
  Filled 2020-02-23 (×5): qty 1

## 2020-02-23 MED ORDER — PNEUMOCOCCAL 13-VAL CONJ VACC IM SUSP
0.5000 mL | INTRAMUSCULAR | Status: AC | PRN
  Administered 2020-03-03: 0.5 mL via INTRAMUSCULAR
  Filled 2020-02-23 (×3): qty 0.5

## 2020-02-23 MED ORDER — ACETAMINOPHEN 10 MG/ML IV SOLN: 1000.0000 mg | Freq: Once | INTRAVENOUS | Status: DC | PRN

## 2020-02-23 MED ORDER — HAEMOPHILUS B POLYSAC CONJ VAC IM SOLR
0.5000 mL | INTRAMUSCULAR | Status: AC | PRN
  Administered 2020-03-03: 0.5 mL via INTRAMUSCULAR
  Filled 2020-02-23 (×2): qty 0.5

## 2020-02-23 MED ORDER — SODIUM CHLORIDE 0.9 % IV BOLUS
1000.0000 mL | Freq: Once | INTRAVENOUS | Status: AC
  Administered 2020-02-23: 1000 mL via INTRAVENOUS

## 2020-02-23 MED ORDER — ACETAMINOPHEN 10 MG/ML IV SOLN
INTRAVENOUS | Status: AC
  Administered 2020-02-23: 1000 mg via INTRAVENOUS
  Filled 2020-02-23: qty 100

## 2020-02-23 MED ORDER — NALOXONE HCL 0.4 MG/ML IJ SOLN: 0.4000 mg | INTRAMUSCULAR | Status: DC | PRN

## 2020-02-23 MED ORDER — HYDROMORPHONE 1 MG/ML IV SOLN
INTRAVENOUS | Status: DC
  Administered 2020-02-23: 0.6 mg via INTRAVENOUS
  Administered 2020-02-24: 0.8 mg via INTRAVENOUS
  Administered 2020-02-24: 1.2 mg via INTRAVENOUS

## 2020-02-23 MED ORDER — ROCURONIUM BROMIDE 100 MG/10ML IV SOLN
INTRAVENOUS | Status: DC | PRN
  Administered 2020-02-23: 60 mg via INTRAVENOUS

## 2020-02-23 MED ORDER — ALBUTEROL SULFATE HFA 108 (90 BASE) MCG/ACT IN AERS
2.0000 | INHALATION_SPRAY | Freq: Four times a day (QID) | RESPIRATORY_TRACT | Status: DC | PRN
  Filled 2020-02-23: qty 6.7

## 2020-02-23 SURGICAL SUPPLY — 69 items
AGENT HMST 10 BLLW SHRT CANN (HEMOSTASIS) ×2
APL PRP STRL LF DISP 70% ISPRP (MISCELLANEOUS) ×2
APPLIER CLIP 11 MED OPEN (CLIP) ×3
APPLIER CLIP 13 LRG OPEN (CLIP)
APR CLP LRG 13 20 CLIP (CLIP)
APR CLP MED 11 20 MLT OPN (CLIP) ×2
BIOPATCH RED 1 DISK 7.0 (GAUZE/BANDAGES/DRESSINGS) ×1 IMPLANT
BLADE CLIPPER SURG (BLADE) IMPLANT
CANISTER SUCT 3000ML PPV (MISCELLANEOUS) ×6 IMPLANT
CHLORAPREP W/TINT 26 (MISCELLANEOUS) ×3 IMPLANT
CLIP APPLIE 11 MED OPEN (CLIP) ×2 IMPLANT
CLIP APPLIE 13 LRG OPEN (CLIP) IMPLANT
COVER SURGICAL LIGHT HANDLE (MISCELLANEOUS) ×3 IMPLANT
COVER WAND RF STERILE (DRAPES) ×3 IMPLANT
DECANTER SPIKE VIAL GLASS SM (MISCELLANEOUS) IMPLANT
DRAIN CHANNEL 19F RND (DRAIN) ×1 IMPLANT
DRAPE INCISE IOBAN 85X60 (DRAPES) ×1 IMPLANT
DRAPE LAPAROSCOPIC ABDOMINAL (DRAPES) ×3 IMPLANT
DRSG OPSITE POSTOP 4X12 (GAUZE/BANDAGES/DRESSINGS) ×1 IMPLANT
DRSG TEGADERM 2-3/8X2-3/4 SM (GAUZE/BANDAGES/DRESSINGS) ×1 IMPLANT
DRSG TELFA 3X8 NADH (GAUZE/BANDAGES/DRESSINGS) ×3 IMPLANT
ELECT BLADE 4.0 EZ CLEAN MEGAD (MISCELLANEOUS) ×3
ELECT BLADE 6.5 EXT (BLADE) ×3 IMPLANT
ELECT REM PT RETURN 9FT ADLT (ELECTROSURGICAL) ×3
ELECTRODE BLDE 4.0 EZ CLN MEGD (MISCELLANEOUS) IMPLANT
ELECTRODE REM PT RTRN 9FT ADLT (ELECTROSURGICAL) ×2 IMPLANT
EVACUATOR SILICONE 100CC (DRAIN) ×1 IMPLANT
GLOVE BIO SURGEON STRL SZ8 (GLOVE) ×3 IMPLANT
GLOVE BIOGEL PI IND STRL 8 (GLOVE) ×2 IMPLANT
GLOVE BIOGEL PI INDICATOR 8 (GLOVE) ×1
GOWN STRL REUS W/ TWL LRG LVL3 (GOWN DISPOSABLE) ×4 IMPLANT
GOWN STRL REUS W/ TWL XL LVL3 (GOWN DISPOSABLE) ×2 IMPLANT
GOWN STRL REUS W/TWL LRG LVL3 (GOWN DISPOSABLE) ×6
GOWN STRL REUS W/TWL XL LVL3 (GOWN DISPOSABLE) ×3
HANDLE SUCTION POOLE (INSTRUMENTS) IMPLANT
HEMOSTAT HEMOBLAST BELLOWS (HEMOSTASIS) ×1 IMPLANT
HEMOSTAT SURGICEL 2X14 (HEMOSTASIS) IMPLANT
KIT BASIN OR (CUSTOM PROCEDURE TRAY) ×3 IMPLANT
KIT TURNOVER KIT B (KITS) ×3 IMPLANT
NS IRRIG 1000ML POUR BTL (IV SOLUTION) ×6 IMPLANT
PACK GENERAL/GYN (CUSTOM PROCEDURE TRAY) ×3 IMPLANT
PAD ARMBOARD 7.5X6 YLW CONV (MISCELLANEOUS) ×6 IMPLANT
PAD DRESSING TELFA 3X8 NADH (GAUZE/BANDAGES/DRESSINGS) IMPLANT
PENCIL SMOKE EVACUATOR (MISCELLANEOUS) ×3 IMPLANT
RELOAD STAPLE 35X2.5 WHT THIN (STAPLE) IMPLANT
RELOAD STAPLE 60 2.6 WHT THN (STAPLE) IMPLANT
RELOAD STAPLER WHITE 60MM (STAPLE) ×6 IMPLANT
SPECIMEN JAR X LARGE (MISCELLANEOUS) ×3 IMPLANT
SPONGE INTESTINAL PEANUT (DISPOSABLE) IMPLANT
SPONGE LAP 18X18 RF (DISPOSABLE) ×4 IMPLANT
SPONGE SURGIFOAM ABS GEL 100 (HEMOSTASIS) IMPLANT
STAPLE ECHEON FLEX 60 POW ENDO (STAPLE) ×1 IMPLANT
STAPLE RELOAD 2.5MM WHITE (STAPLE) ×6 IMPLANT
STAPLER RELOAD WHITE 60MM (STAPLE) ×9
STAPLER VISISTAT 35W (STAPLE) ×3 IMPLANT
SUCTION POOLE HANDLE (INSTRUMENTS)
SUT ETHILON 2 0 FS 18 (SUTURE) ×1 IMPLANT
SUT MNCRL AB 4-0 PS2 18 (SUTURE) ×2 IMPLANT
SUT PDS AB 1 TP1 96 (SUTURE) ×6 IMPLANT
SUT PROLENE 2 0 SH DA (SUTURE) ×2 IMPLANT
SUT SILK 0 TIES 10X30 (SUTURE) ×3 IMPLANT
SUT SILK 2 0 SH (SUTURE) ×6 IMPLANT
SUT SILK 2 0 TIES 10X30 (SUTURE) ×3 IMPLANT
SUT SILK 2 0SH CR/8 30 (SUTURE) ×3 IMPLANT
SUT VIC AB 3-0 SH 27 (SUTURE) ×6
SUT VIC AB 3-0 SH 27X BRD (SUTURE) IMPLANT
TOWEL GREEN STERILE (TOWEL DISPOSABLE) ×3 IMPLANT
TOWEL GREEN STERILE FF (TOWEL DISPOSABLE) ×3 IMPLANT
TRAY FOLEY MTR SLVR 14FR STAT (SET/KITS/TRAYS/PACK) IMPLANT
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
## 2020-02-23 NOTE — ED Triage Notes (Signed)
Patient is being discharged from the Urgent Care and sent to the Emergency Department via ems . Per Doyce Para , patient is in need of higher level of care . Patient is aware and verbalizes understanding of plan of care.  Vitals:   02/23/20 0824  BP: 92/61  Pulse: (!) 132  Resp: 20  Temp: 98.3 F (36.8 C)  SpO2: 98%
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
## 2020-02-23 NOTE — Transfer of Care (Signed)
Immediate Anesthesia Transfer of Care Note  Patient: Kara Stewart  Procedure(s) Performed: SPLENECTOMY (N/A ) OPEN LIVER BIOPSY  Patient Location: PACU  Anesthesia Type:General  Level of Consciousness: awake, alert , oriented and patient cooperative  Airway & Oxygen Therapy: Patient Spontanous Breathing  Post-op Assessment: Report given to RN and Post -op Vital signs reviewed and stable  Post vital signs: Reviewed and stable  Last Vitals:  Vitals Value Taken Time  BP 125/74 02/23/20 1832  Temp    Pulse 101 02/23/20 1835  Resp 34 02/23/20 1835  SpO2 100 % 02/23/20 1835  Vitals shown include unvalidated device data.  Last Pain:  Vitals:   02/23/20 1605  TempSrc: Oral  PainSc:          Complications: No complications documented.

## 2020-02-23 NOTE — ED Notes (Signed)
Dr. Assessed pt.  
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
## 2020-02-23 NOTE — H&P (Signed)
[name] May 08, 1975  [id number].    Requesting MD: Dr. Judd Lien Chief Complaint/Reason for Consult: Fall, G4 splenic laceration, impacted distal radius fracture  Primary Survey: airway intact, breath sounds intact bilaterally, pulses intact peripherally   HPI: Kara Stewart is a 44 y.o. female with a history of prior alcohol abuse w/ alcohol related hepatitis and hypothyroidism who was transferred from [facility] ED after a fall and found to have laceration and left impacted distal radius fracture.   Patient reports that she fells on 2 separate occassions over the last 1 week. No head trauma or LOC. She now reports abdominal pain and left wrist pain. She presented to the urgent care this AM where she was tachycardic and hypotensive. Sent to the ED for further eval. Hgb 12.9 this AM at 0947. Workup showed above injuries. Patient was transferred to [facility] for further management and trauma admission. There is some mention that falls are 2/2 joint pain that she is seeing a rheumatologist for.   ROS: Review of Systems  Unable to perform ROS: Acuity of condition    Family History  Problem Relation Age of Onset  . Stomach cancer Paternal Grandfather        colon cancer  . Cancer Paternal Grandfather        throat and esophagus  . Breast cancer Maternal Grandmother   . Cancer Maternal Grandmother        skin  . Anxiety disorder Maternal Grandmother   . Hypertension Mother   . Other Mother        fatty liver  . Hyperlipidemia Mother   . Liver disease Mother        fatty liver, does not drink.   . Sleep apnea Mother   . Other Father        varicose veins; stomach issues; hernia  . Hypertension Father   . Hyperlipidemia Father   . Cancer Father        prostate  . Arthritis Father        rheumatoid  . Neuropathy Father   . Rheum arthritis Father   . Thyroid disease Sister   . Hypertension Sister   . Other Brother        hernia  . Diabetes Maternal Grandfather   . Heart disease  Maternal Grandfather   . Other Paternal Grandmother        hernia  . COPD Paternal Grandmother   . Diabetes Paternal Grandmother   . Healthy Daughter     Past Medical History:  Diagnosis Date  . Acid reflux   . 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 Alcoholic hepatitis without ascites   . Anxiety   . Asthma   . Colitis   . GERD (gastroesophageal reflux disease) 07/21/2012  . Hiatal hernia   . History of alcohol abuse 04/26/2015  . Hypertriglyceridemia   . Hypothyroid   . Iritis    frequent  . Ovarian cyst   . Panic attacks   . SVT (supraventricular tachycardia) (HCC)    last issue 12/2017    Past Surgical History:  Procedure Laterality Date  . BIOPSY N/A 02/05/2015   Procedure: BIOPSY;  Surgeon: West Bali, MD;  Location: [facility] ORS;  Service: Endoscopy;  Laterality: N/A;  . COLONOSCOPY    . COLONOSCOPY WITH PROPOFOL N/A 02/05/2015   OYD:XAJOI HH/mild diverticulosis  . ESOPHAGEAL DILATION N/A 11/21/2015   Procedure: ESOPHAGEAL DILATION;  Surgeon: Corbin Ade, MD;  Location: [facility] END[facility] SUITE;  Service: Endoscopy;  Laterality: N/A;  . ESOPHAGOGASTRODUODENOSCOPY  11/06/2011   [name]: MILD Esophagitis/PATENT ESOPHAGEAL Stricture/  Moderate gastritis. Bx no.hpylori or celiac, +gastritis  . ESOPHAGOGASTRODUODENOSCOPY (EGD) WITH PROPOFOL N/A 03/20/2014   [name]: 1. Mild esophagitis & distal esophagela stricture. 2. small hiatal hernia 3. moderate non-erosive gastritis and mild duodenits  . ESOPHAGOGASTRODUODENOSCOPY (EGD) WITH PROPOFOL N/A 11/21/2015   Dr. Jena Gaussourk: LA grade B esophagitis, MW tear likely source of hematemesis  . LAPAROSCOPIC TUBAL LIGATION Bilateral 09/14/2018   Procedure: LAPAROSCOPIC TUBAL LIGATION;  Surgeon: Allie Bossierove, Myra C, MD;  Location: [facility];  Service: Gynecology;  Laterality: Bilateral;  . SAVORY DILATION N/A 03/20/2014   Procedure: SAVORY DILATION;  Surgeon: West BaliSandi L Fields, MD;  Location: [facility]S;  Service: Endoscopy;  Laterality: N/A;  dilated with # 12.8,  14,15,16  . TOOTH EXTRACTION      Social History:  reports that she has been smoking cigarettes. She has never used smokeless tobacco. She reports that she does not drink alcohol and does not use drugs. Prior alcohol abuse hx. Has drank 1 glass of wine and 1 glass of beer in the last month Tobacco abuse Marijuana use Unemployed  Allergies:  Allergies  Allergen Reactions  . Penicillins Anaphylaxis    Has patient had a PCN reaction causing immediate rash, facial/tongue/throat swelling, SOB or lightheadedness with hypotension: yes Has patient had a PCN reaction causing severe rash involving mucus membranes or skin necrosis: No Has patient had a PCN reaction that required hospitalization Yes Has patient had a PCN reaction occurring within the last 10 years: No If all of the above answers are "NO", then may proceed with Cephalosporin use.   . Lidocaine Other (See Comments)    Can use topical lidocaine but if ingested causes Hallucinations.    (Not in a hospital admission)    Physical Exam: Blood pressure 96/67, pulse (!) 116, temperature 97.7 F (36.5 C), temperature source Oral, resp. rate (!) 27, SpO2 100 %, not currently breastfeeding. General: pleasant, WD/WN white female who is laying in bed in mild distress HEENT: head is normocephalic, atraumatic.  Sclera are noninjected.  PERRL.  Ears and nose without any masses or lesions.  Mouth is pink and moist. Dentition fair Neck: no midline c-spine tenderness or step offs. Able rom of the c-spine without midline pain Heart: Tachycardic with regular rhythm.  Normal s1,s2. No obvious murmurs, gallops, or rubs noted.  Palpable R radial and b/l pedal pulses bilaterally  Lungs: CTAB, no wheezes, rhonchi, or rales noted.  Respiratory effort nonlabored Abd: Soft, mild distension, tenderness of the left abdomen with guarding, +BS, no masses, hernias, or organomegaly MS: Left wrist in splint. No gross deformities of the RUE or BLE. No BLE edema,  calves soft and nontender Skin: warm and dry with no masses, lesions, or rashes Psych: A&Ox4 with an appropriate affect Neuro: cranial nerves grossly intact, equal strength in BLE bilaterally, normal speech, thought process intact. Gait not assessed.  Results for orders placed or performed during the hospital encounter of 02/23/20 (from the past 48 hour(s))  Comprehensive metabolic panel     Status: Abnormal   Collection Time: 02/23/20  9:47 AM  Result Value Ref Range   Sodium 138 135 - 145 mmol/L   Potassium 3.6 3.5 - 5.1 mmol/L   Chloride 104 98 - 111 mmol/L   CO2 20 (L) 22 - 32 mmol/L   Glucose, Bld 182 (H) 70 - 99 mg/dL    Comment: Glucose reference range applies only to  samples taken after fasting for at least 8 hours.   BUN 6 6 - 20 mg/dL   Creatinine, Ser 0.86 0.44 - 1.00 mg/dL   Calcium 8.5 (L) 8.9 - 10.3 mg/dL   Total Protein 6.1 (L) 6.5 - 8.1 g/dL   Albumin 3.5 3.5 - 5.0 g/dL   AST 578 (H) 15 - 41 U/L   ALT 67 (H) 0 - 44 U/L   Alkaline Phosphatase 73 38 - 126 U/L   Total Bilirubin 1.5 (H) 0.3 - 1.2 mg/dL   GFR, Estimated >46 >96 mL/min    Comment: (NOTE) Calculated using the CKD-EPI Creatinine Equation (2021)    Anion gap 14 5 - 15    Comment: Performed at [facility], [address], [place], [place] [address]  Brain natriuretic peptide     Status: None   Collection Time: 02/23/20  9:47 AM  Result Value Ref Range   B Natriuretic Peptide 54.0 0.0 - 100.0 pg/mL    Comment: Performed at [facility], [address] [place], [place] [address]  Troponin I (High Sensitivity)     Status: None   Collection Time: 02/23/20  9:47 AM  Result Value Ref Range   Troponin I (High Sensitivity) 2 <18 ng/L    Comment: (NOTE) Elevated high sensitivity troponin I (hsTnI) values and significant  changes across serial measurements may suggest ACS but many other  chronic and acute conditions are known to elevate hsTnI results.  Refer to the "Links" section for chest pain algorithms and  additional  guidance. Performed at [facility], [address] [place], [place] [address]   CBC with Differential     Status: Abnormal   Collection Time: 02/23/20  9:47 AM  Result Value Ref Range   WBC 16.2 (H) 4.0 - 10.5 K/uL   RBC 3.41 (L) 3.87 - 5.11 MIL/uL   Hemoglobin 12.9 12.0 - 15.0 g/dL   HCT 72.5 36 - 46 %   MCV 106.2 (H) 80.0 - 100.0 fL   MCH 37.8 (H) 26.0 - 34.0 pg   MCHC 35.6 30.0 - 36.0 g/dL   RDW 36.6 44.0 - 34.7 %   Platelets 205 150 - 400 K/uL   nRBC 0.0 0.0 - 0.2 %   Neutrophils Relative % 86 %   Neutro Abs 14.0 (H) 1.7 - 7.7 K/uL   Lymphocytes Relative 7 %   Lymphs Abs 1.1 0.7 - 4.0 K/uL   Monocytes Relative 5 %   Monocytes Absolute 0.8 0.1 - 1.0 K/uL   Eosinophils Relative 2 %   Eosinophils Absolute 0.3 0.0 - 0.5 K/uL   Basophils Relative 0 %   Basophils Absolute 0.1 0.0 - 0.1 K/uL   Immature Granulocytes 0 %   Abs Immature Granulocytes 0.05 0.00 - 0.07 K/uL    Comment: Performed at [facility], [address] [place], [place] [address]  Protime-INR     Status: None   Collection Time: 02/23/20  9:47 AM  Result Value Ref Range   Prothrombin Time 14.0 11.4 - 15.2 seconds   INR 1.1 0.8 - 1.2    Comment: (NOTE) INR goal varies based on device and disease states. Performed at [facility], [address] [place], [place] [address]   hCG, quantitative, pregnancy     Status: None   Collection Time: 02/23/20  9:47 AM  Result Value Ref Range   hCG, Beta Chain, Quant, S <1 <5 mIU/mL    Comment:  GEST. AGE      CONC.  (mIU/mL)   <=1 WEEK        5 - 50     2 WEEKS       50 - 500     3 WEEKS       100 - 10,000     4 WEEKS     1,000 - 30,000     5 WEEKS     3,500 - 115,000   6-8 WEEKS     12,000 - 270,000    12 WEEKS     15,000 - 220,000        FEMALE AND NON-PREGNANT FEMALE:     LESS THAN 5 mIU/mL Performed at [facility], [address] [place], [place] [address]   Lactic acid, plasma     Status: Abnormal   Collection Time: 02/23/20  9:49 AM   Result Value Ref Range   Lactic Acid, Venous 3.3 (HH) 0.5 - 1.9 mmol/L    Comment: CRITICAL RESULT CALLED TO, READ BACK BY AND VERIFIED WITH: MCCARTNEY,S AT 10:15AM ON 02/23/20 BY [facility] Performed at [facility], [address] [place], [place] [address]   CBG monitoring, ED     Status: Abnormal   Collection Time: 02/23/20 10:15 AM  Result Value Ref Range   Glucose-Capillary 185 (H) 70 - 99 mg/dL    Comment: Glucose reference range applies only to samples taken after fasting for at least 8 hours.  Blood culture (routine x 2)     Status: None (Preliminary result)   Collection Time: 02/23/20 10:21 AM   Specimen: Hand; Blood  Result Value Ref Range   Specimen Description HAND RIGHT    Special Requests      BOTTLES DRAWN AEROBIC AND ANAEROBIC Blood Culture adequate volume Performed at [facility], [address] [place], [place] [address]    Culture PENDING    Report Status PENDING   Lactic acid, plasma     Status: Abnormal   Collection Time: 02/23/20 12:36 PM  Result Value Ref Range   Lactic Acid, Venous 2.0 (HH) 0.5 - 1.9 mmol/L    Comment: CRITICAL RESULT CALLED TO, READ BACK BY AND VERIFIED WITH: DOSS,M@1355  BY MATTHEWS, B 11.19.2021 Performed at [facility], [address], [place], [place] [address]   Troponin I (High Sensitivity)     Status: None   Collection Time: 02/23/20 12:36 PM  Result Value Ref Range   Troponin I (High Sensitivity) <2 <18 ng/L    Comment: (NOTE) Elevated high sensitivity troponin I (hsTnI) values and significant  changes across serial measurements may suggest ACS but many other  chronic and acute conditions are known to elevate hsTnI results.  Refer to the "Links" section for chest pain algorithms and additional  guidance. Performed at [facility], [address], [place], [place] [address]    DG Forearm Left  Result Date: 02/23/2020 CLINICAL DATA:  Fall yesterday due to syncope EXAM: LEFT FOREARM - 2 VIEW COMPARISON:  None. FINDINGS:  Distal radius fracture with ventral sided impaction and fragmentation. Intact and located proximal forearm. Negative for elbow joint effusion. Located radiocarpal joint. IMPRESSION: Impacted distal radius fracture. Electronically Signed   By: Marnee Spring M.D.   On: 02/23/2020 09:24   CT Angio Chest PE W and/or Wo Contrast  Result Date: 02/23/2020 CLINICAL DATA:  Larey Seat last evening. Left-sided chest pain. Syncopal episode. EXAM: CT ANGIOGRAPHY CHEST CT ABDOMEN AND PELVIS WITH CONTRAST TECHNIQUE: Multidetector CT imaging of the chest  was performed using the standard protocol during bolus administration of intravenous contrast. Multiplanar CT image reconstructions and MIPs were obtained to evaluate the vascular anatomy. Multidetector CT imaging of the abdomen and pelvis was performed using the standard protocol during bolus administration of intravenous contrast. CONTRAST:  OMNIPAQUE IOHEXOL 350 MG/ML SOLN COMPARISON:  None. FINDINGS: CTA CHEST FINDINGS Cardiovascular: The heart is normal in size. No pericardial effusion. The aorta is normal in caliber. No dissection. No atherosclerotic calcifications. The branch vessels are patent. No coronary artery calcifications. The pulmonary arteries appear normal. No filling defects to suggest pulmonary embolism. Mediastinum/Nodes: No mediastinal or hilar mass or adenopathy or hematoma. The esophagus is grossly normal. Lungs/Pleura: No acute pulmonary findings. No pulmonary contusion or pneumothorax. No pleural effusion. Musculoskeletal: Musculoskeletal no rib, sternal or thoracic vertebral body fractures are identified. There is a remote healed left lateral rib fracture noted. Review of the MIP images confirms the above findings. CT ABDOMEN and PELVIS FINDINGS Hepatobiliary: No acute hepatic injury is identified. No hepatic lesions. There is diffuse fatty infiltration of the liver noted. The portal and hepatic veins are patent. The gallbladder is unremarkable. No  common bile duct dilatation. Pancreas: No mass, inflammation or ductal dilatation. No acute injury. Spleen: Significant splenic injury is demonstrated. Multiple large lacerations extending to the splenic hilum with areas of active bleeding/extravasation suspected. There is a large subcapsular hematoma along with moderate abdominal/pelvic hemoperitoneum. Findings consistent with a grade 4 splenic injury. Adrenals/Urinary Tract: The adrenal glands and kidneys are unremarkable. No acute renal injury or perinephric fluid collection. Stomach/Bowel: The stomach, duodenum, small bowel and colon are unremarkable. No acute inflammatory changes, mass lesions or obstructive findings. Vascular/Lymphatic: The aorta and branch vessels are patent. The major venous structures are patent. No mesenteric or retroperitoneal mass or adenopathy. Reproductive: Reproductive the uterus and ovaries are unremarkable. Tubal ligation clips are noted bilaterally. Other: Moderate volume abdominal/pelvic hemoperitoneum Musculoskeletal: The bony pelvis is intact. No pelvic fractures. The pubic symphysis and SI joints are maintained. Both hips are normally located. The lumbar vertebral bodies are normally aligned. No fractures are identified. Review of the MIP images confirms the above findings. IMPRESSION: 1. Grade 4 splenic injury with multiple large lacerations extending to the splenic hilum, small vascular injuries with contrast extravasation and moderate volume abdominal/pelvic hemoperitoneum. 2. No CT findings for pulmonary embolism. 3. No acute pulmonary findings or pneumothorax. 4. No acute bony findings. 5. Diffuse fatty infiltration of the liver. These results were called by telephone at the time of interpretation on 02/23/2020 at 2:18 pm to provider Geoffery Lyons , who verbally acknowledged these results. Electronically Signed   By: Rudie Meyer M.D.   On: 02/23/2020 14:18   CT ABDOMEN PELVIS W CONTRAST  Result Date:  02/23/2020 CLINICAL DATA:  Larey Seat last evening. Left-sided chest pain. Syncopal episode. EXAM: CT ANGIOGRAPHY CHEST CT ABDOMEN AND PELVIS WITH CONTRAST TECHNIQUE: Multidetector CT imaging of the chest was performed using the standard protocol during bolus administration of intravenous contrast. Multiplanar CT image reconstructions and MIPs were obtained to evaluate the vascular anatomy. Multidetector CT imaging of the abdomen and pelvis was performed using the standard protocol during bolus administration of intravenous contrast. CONTRAST:  OMNIPAQUE IOHEXOL 350 MG/ML SOLN COMPARISON:  None. FINDINGS: CTA CHEST FINDINGS Cardiovascular: The heart is normal in size. No pericardial effusion. The aorta is normal in caliber. No dissection. No atherosclerotic calcifications. The branch vessels are patent. No coronary artery calcifications. The pulmonary arteries appear normal. No filling defects to  suggest pulmonary embolism. Mediastinum/Nodes: No mediastinal or hilar mass or adenopathy or hematoma. The esophagus is grossly normal. Lungs/Pleura: No acute pulmonary findings. No pulmonary contusion or pneumothorax. No pleural effusion. Musculoskeletal: Musculoskeletal no rib, sternal or thoracic vertebral body fractures are identified. There is a remote healed left lateral rib fracture noted. Review of the MIP images confirms the above findings. CT ABDOMEN and PELVIS FINDINGS Hepatobiliary: No acute hepatic injury is identified. No hepatic lesions. There is diffuse fatty infiltration of the liver noted. The portal and hepatic veins are patent. The gallbladder is unremarkable. No common bile duct dilatation. Pancreas: No mass, inflammation or ductal dilatation. No acute injury. Spleen: Significant splenic injury is demonstrated. Multiple large lacerations extending to the splenic hilum with areas of active bleeding/extravasation suspected. There is a large subcapsular hematoma along with moderate abdominal/pelvic  hemoperitoneum. Findings consistent with a grade 4 splenic injury. Adrenals/Urinary Tract: The adrenal glands and kidneys are unremarkable. No acute renal injury or perinephric fluid collection. Stomach/Bowel: The stomach, duodenum, small bowel and colon are unremarkable. No acute inflammatory changes, mass lesions or obstructive findings. Vascular/Lymphatic: The aorta and branch vessels are patent. The major venous structures are patent. No mesenteric or retroperitoneal mass or adenopathy. Reproductive: Reproductive the uterus and ovaries are unremarkable. Tubal ligation clips are noted bilaterally. Other: Moderate volume abdominal/pelvic hemoperitoneum Musculoskeletal: The bony pelvis is intact. No pelvic fractures. The pubic symphysis and SI joints are maintained. Both hips are normally located. The lumbar vertebral bodies are normally aligned. No fractures are identified. Review of the MIP images confirms the above findings. IMPRESSION: 1. Grade 4 splenic injury with multiple large lacerations extending to the splenic hilum, small vascular injuries with contrast extravasation and moderate volume abdominal/pelvic hemoperitoneum. 2. No CT findings for pulmonary embolism. 3. No acute pulmonary findings or pneumothorax. 4. No acute bony findings. 5. Diffuse fatty infiltration of the liver. These results were called by telephone at the time of interpretation on 02/23/2020 at 2:18 pm to provider Geoffery Lyons , who verbally acknowledged these results. Electronically Signed   By: Rudie Meyer M.D.   On: 02/23/2020 14:18   Anti-infectives (From admission, onward)   None      Assessment/Plan Fall G4 splenic laceration - To OR for ex lap, splenectomy  Left impacted distal radius fracture - Ortho consult post op Hx SVT  Hx Hypothyroidism - home meds when taking PO Hx of prior alcohol Abuse - CIWA post op FEN - NPO, IVF  VTE - SCDs, Lovenox on hold given splenic laceration  ID - Cipro periop (PCN  allergy) Foley- Place in OR Dispo - Admit to inpatient. To OR emergently for ex-lap, splenectomy.   Jacinto Halim, [facility] Surgery 02/23/2020, 3:05 PM Please see Amion for pager number during day hours 7:00am-4:30pm

## 2020-02-23 NOTE — Anesthesia Procedure Notes (Signed)
Procedure Name: Intubation Date/Time: 02/23/2020 5:09 PM Performed by: Sammie Bench, CRNA Pre-anesthesia Checklist: Patient identified, Suction available, Emergency Drugs available, Patient being monitored and Timeout performed Patient Re-evaluated:Patient Re-evaluated prior to induction Oxygen Delivery Method: Circle system utilized Preoxygenation: Pre-oxygenation with 100% oxygen Induction Type: IV induction Ventilation: Mask ventilation without difficulty Laryngoscope Size: Mac and 3 Grade View: Grade I Tube type: Oral Tube size: 7.0 mm Number of attempts: 1 Airway Equipment and Method: Stylet and Oral airway Placement Confirmation: ETT inserted through vocal cords under direct vision,  positive ETCO2 and breath sounds checked- equal and bilateral Secured at: 21 cm Tube secured with: Tape Dental Injury: Teeth and Oropharynx as per pre-operative assessment

## 2020-02-23 NOTE — ED Notes (Signed)
Called [facility] for transport to [facility] ER. 

## 2020-02-23 NOTE — ED Notes (Signed)
Date and time results received: 02/23/20 10:15am   Test: Lactic acid Critical Value: 3.3  Name of Provider Notified: Delo MD  Orders Received? Or Actions Taken?: Orders Received - See Orders for details

## 2020-02-23 NOTE — ED Provider Notes (Signed)
[facility] EMERGENCY DEPARTMENT Provider Note   CSN: [id number] Arrival date & time: 02/23/20  8416     History Chief Complaint  Patient presents with  . Chest Pain    Kara Stewart is a 44 y.o. female.  Patient is a 44 year old female with history of asthma, anxiety, prior alcohol abuse with alcohol related hepatitis.  Patient presents here from urgent care for evaluation.  Patient reports falling on 2 separate occasions over the past few days.  This morning she hurt her left wrist and chest and went to urgent care.  She was then sent here for further evaluation.  Patient is describing shortness of breath and pain in her back.  She arrives here tachycardic and hypotensive.  The history is provided by the patient.       Past Medical History:  Diagnosis Date  . Acid reflux   . 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 Alcoholic hepatitis without ascites   . Anxiety   . Asthma   . Colitis   . GERD (gastroesophageal reflux disease) 07/21/2012  . Hiatal hernia   . History of alcohol abuse 04/26/2015  . Hypertriglyceridemia   . Hypothyroid   . Iritis    frequent  . Ovarian cyst   . Panic attacks   . SVT (supraventricular tachycardia) (HCC)    last issue 12/2017    Patient Active Problem List   Diagnosis Date Noted  . Right sided abdominal pain 04/12/2019  . Abnormal LFTs 10/10/2018  . Unwanted fertility 09/12/2018  . 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 Chronic liver disease 11/16/2017  . History of marijuana use 01/20/2015  . Abnormal CT scan, colon 01/08/2015  . Anxiety 11/29/2014  . History of colitis 11/29/2014  . RUQ pain 08/27/2014  . Thyroid nodule 01/11/2014  . SVT (supraventricular tachycardia) (HCC) 09/07/2012  . History of alcohol use 07/21/2012  . Fatty liver 10/28/2011    Past Surgical History:  Procedure Laterality Date  . BIOPSY N/A 02/05/2015   Procedure: BIOPSY;  Surgeon: West Bali, MD;  Location: [facility];  Service: Endoscopy;   Laterality: N/A;  . COLONOSCOPY    . COLONOSCOPY WITH PROPOFOL N/A 02/05/2015   SAY:TKZSW HH/mild diverticulosis  . ESOPHAGEAL DILATION N/A 11/21/2015   Procedure: ESOPHAGEAL DILATION;  Surgeon: Corbin Ade, MD;  Location: [facility];  Service: Endoscopy;  Laterality: N/A;  . ESOPHAGOGASTRODUODENOSCOPY  11/06/2011   [name]: MILD Esophagitis/PATENT ESOPHAGEAL Stricture/  Moderate gastritis. Bx no.hpylori or celiac, +gastritis  . ESOPHAGOGASTRODUODENOSCOPY (EGD) WITH PROPOFOL N/A 03/20/2014   [name]: 1. Mild esophagitis & distal esophagela stricture. 2. small hiatal hernia 3. moderate non-erosive gastritis and mild duodenits  . ESOPHAGOGASTRODUODENOSCOPY (EGD) WITH PROPOFOL N/A 11/21/2015   Dr. Jena Gauss: LA grade B esophagitis, MW tear likely source of hematemesis  . LAPAROSCOPIC TUBAL LIGATION Bilateral 09/14/2018   Procedure: LAPAROSCOPIC TUBAL LIGATION;  Surgeon: Allie Bossier, MD;  Location: [facility];  Service: Gynecology;  Laterality: Bilateral;  . SAVORY DILATION N/A 03/20/2014   Procedure: SAVORY DILATION;  Surgeon: West Bali, MD;  Location: [facility];  Service: Endoscopy;  Laterality: N/A;  dilated with # 12.8, 14,15,16  . TOOTH EXTRACTION       OB History    Gravida  3   Para  1   Term  1   Preterm      AB  2   Living  1     SAB  1   TAB  1   Ectopic  Multiple  0   Live Births  1           Family History  Problem Relation Age of Onset  . Stomach cancer Paternal Grandfather        colon cancer  . Cancer Paternal Grandfather        throat and esophagus  . Breast cancer Maternal Grandmother   . Cancer Maternal Grandmother        skin  . Anxiety disorder Maternal Grandmother   . Hypertension Mother   . Other Mother        fatty liver  . Hyperlipidemia Mother   . Liver disease Mother        fatty liver, does not drink.   . Sleep apnea Mother   . Other Father        varicose veins; stomach issues; hernia  . Hypertension Father   .  Hyperlipidemia Father   . Cancer Father        prostate  . Arthritis Father        rheumatoid  . Neuropathy Father   . Rheum arthritis Father   . Thyroid disease Sister   . Hypertension Sister   . Other Brother        hernia  . Diabetes Maternal Grandfather   . Heart disease Maternal Grandfather   . Other Paternal Grandmother        hernia  . COPD Paternal Grandmother   . Diabetes Paternal Grandmother   . Healthy Daughter     Social History   Tobacco Use  . Smoking status: Current Some Day Smoker    Types: Cigarettes    Last attempt to quit: 12/22/2017    Years since quitting: 2.1  . Smokeless tobacco: Never Used  . Tobacco comment: "might smoke one on a bad day"  Vaping Use  . Vaping Use: Never used  Substance Use Topics  . Alcohol use: No    Alcohol/week: 0.0 standard drinks    Comment: Sober since 2017  . Drug use: Never    Home Medications Prior to Admission medications   Medication Sig Start Date End Date Taking? Authorizing Provider  albuterol (PROVENTIL HFA;VENTOLIN HFA) 108 (90 Base) MCG/ACT inhaler Inhale 2 puffs into the lungs every 6 (six) hours as needed for wheezing or shortness of breath. 10/14/17   Aliene Beams, MD  Cholecalciferol (VITAMIN D3 PO) Take 1 tablet by mouth daily.     [provider]  Cyanocobalamin (VITAMIN B-12 IJ) Inject as directed every 30 (thirty) days.    [provider]  folic acid (FOLVITE) 1 MG tablet TAKE 1 TABLET BY MOUTH EVERY DAY Patient taking differently: Take 1 mg by mouth daily.  01/03/20   Tiffany Kocher, PA-C  MILK THISTLE PO Take by mouth daily.    [provider]  ursodiol (ACTIGALL) 500 MG tablet TAKE 1 TABLET BY MOUTH TWICE A DAY Patient taking differently: Take 500 mg by mouth in the morning and at bedtime.  10/03/19   Gelene Mink, NP  vitamin B-12 (CYANOCOBALAMIN) 1000 MCG tablet Take 1 tablet (1,000 mcg total) by mouth daily. Patient not taking: Reported on 02/19/2020 08/03/19   Letta Median, PA-C    Allergies    Penicillins and Lidocaine  Review of Systems   Review of Systems  All other systems reviewed and are negative.   Physical Exam Updated Vital Signs BP 100/89   Pulse (!) 118   Temp 97.7 F (36.5 C) (Oral)  Resp 12   SpO2 100%   Physical Exam Vitals and nursing note reviewed.  Constitutional:      General: She is not in acute distress.    Appearance: She is well-developed. She is not diaphoretic.  HENT:     Head: Normocephalic and atraumatic.  Cardiovascular:     Rate and Rhythm: Normal rate and regular rhythm.     Heart sounds: No murmur heard.  No friction rub. No gallop.   Pulmonary:     Effort: Pulmonary effort is normal. No respiratory distress.     Breath sounds: Normal breath sounds. No wheezing.  Abdominal:     General: Bowel sounds are normal. There is no distension.     Palpations: Abdomen is soft.     Tenderness: There is no abdominal tenderness.  Musculoskeletal:        General: Normal range of motion.     Cervical back: Normal range of motion and neck supple.     Right lower leg: No tenderness. No edema.     Left lower leg: No tenderness. No edema.     Comments: There is deformity of the left distal radius.  Motor and and sensation are intact.  Skin:    General: Skin is warm and dry.  Neurological:     Mental Status: She is alert and oriented to person, place, and time.     ED Results / Procedures / Treatments   Labs (all labs ordered are listed, but only abnormal results are displayed) Labs Reviewed  COMPREHENSIVE METABOLIC PANEL - Abnormal; Notable for the following components:      Result Value   CO2 20 (*)    Glucose, Bld 182 (*)    Calcium 8.5 (*)    Total Protein 6.1 (*)    AST 116 (*)    ALT 67 (*)    Total Bilirubin 1.5 (*)    All other components within normal limits  CBC WITH DIFFERENTIAL/PLATELET - Abnormal; Notable for the following components:   WBC 16.2 (*)    RBC 3.41 (*)    MCV 106.2 (*)     MCH 37.8 (*)    Neutro Abs 14.0 (*)    All other components within normal limits  LACTIC ACID, PLASMA - Abnormal; Notable for the following components:   Lactic Acid, Venous 3.3 (*)    All other components within normal limits  LACTIC ACID, PLASMA - Abnormal; Notable for the following components:   Lactic Acid, Venous 2.0 (*)    All other components within normal limits  CBG MONITORING, ED - Abnormal; Notable for the following components:   Glucose-Capillary 185 (*)    All other components within normal limits  CULTURE, BLOOD (ROUTINE X 2)  CULTURE, BLOOD (ROUTINE X 2)  RESP PANEL BY RT-PCR (FLU A&B, COVID) ARPGX2  BRAIN NATRIURETIC PEPTIDE  PROTIME-INR  HCG, QUANTITATIVE, PREGNANCY  HEMOGLOBIN AND HEMATOCRIT, BLOOD  TYPE AND SCREEN  TROPONIN I (HIGH SENSITIVITY)  TROPONIN I (HIGH SENSITIVITY)    EKG EKG Interpretation  Date/Time:  Friday February 23 2020 09:23:24 EST Ventricular Rate:  127 PR Interval:    QRS Duration: 77 QT Interval:  307 QTC Calculation: 447 R Axis:   55 Text Interpretation: Sinus tachycardia Abnormal R-wave progression, early transition Borderline T wave abnormalities No significant change since 05/05/2016 Confirmed by Geoffery LyonseLo, Delson Dulworth ([id number]) on 02/23/2020 10:10:37 AM   Radiology DG Forearm Left  Result Date: 02/23/2020 CLINICAL DATA:  Fall yesterday due to syncope EXAM: LEFT FOREARM -  2 VIEW COMPARISON:  None. FINDINGS: Distal radius fracture with ventral sided impaction and fragmentation. Intact and located proximal forearm. Negative for elbow joint effusion. Located radiocarpal joint. IMPRESSION: Impacted distal radius fracture. Electronically Signed   By: Marnee Spring M.D.   On: 02/23/2020 09:24   CT Angio Chest PE W and/or Wo Contrast  Result Date: 02/23/2020 CLINICAL DATA:  Larey Seat last evening. Left-sided chest pain. Syncopal episode. EXAM: CT ANGIOGRAPHY CHEST CT ABDOMEN AND PELVIS WITH CONTRAST TECHNIQUE: Multidetector CT imaging of the  chest was performed using the standard protocol during bolus administration of intravenous contrast. Multiplanar CT image reconstructions and MIPs were obtained to evaluate the vascular anatomy. Multidetector CT imaging of the abdomen and pelvis was performed using the standard protocol during bolus administration of intravenous contrast. CONTRAST:  OMNIPAQUE IOHEXOL 350 MG/ML SOLN COMPARISON:  None. FINDINGS: CTA CHEST FINDINGS Cardiovascular: The heart is normal in size. No pericardial effusion. The aorta is normal in caliber. No dissection. No atherosclerotic calcifications. The branch vessels are patent. No coronary artery calcifications. The pulmonary arteries appear normal. No filling defects to suggest pulmonary embolism. Mediastinum/Nodes: No mediastinal or hilar mass or adenopathy or hematoma. The esophagus is grossly normal. Lungs/Pleura: No acute pulmonary findings. No pulmonary contusion or pneumothorax. No pleural effusion. Musculoskeletal: Musculoskeletal no rib, sternal or thoracic vertebral body fractures are identified. There is a remote healed left lateral rib fracture noted. Review of the MIP images confirms the above findings. CT ABDOMEN and PELVIS FINDINGS Hepatobiliary: No acute hepatic injury is identified. No hepatic lesions. There is diffuse fatty infiltration of the liver noted. The portal and hepatic veins are patent. The gallbladder is unremarkable. No common bile duct dilatation. Pancreas: No mass, inflammation or ductal dilatation. No acute injury. Spleen: Significant splenic injury is demonstrated. Multiple large lacerations extending to the splenic hilum with areas of active bleeding/extravasation suspected. There is a large subcapsular hematoma along with moderate abdominal/pelvic hemoperitoneum. Findings consistent with a grade 4 splenic injury. Adrenals/Urinary Tract: The adrenal glands and kidneys are unremarkable. No acute renal injury or perinephric fluid collection.  Stomach/Bowel: The stomach, duodenum, small bowel and colon are unremarkable. No acute inflammatory changes, mass lesions or obstructive findings. Vascular/Lymphatic: The aorta and branch vessels are patent. The major venous structures are patent. No mesenteric or retroperitoneal mass or adenopathy. Reproductive: Reproductive the uterus and ovaries are unremarkable. Tubal ligation clips are noted bilaterally. Other: Moderate volume abdominal/pelvic hemoperitoneum Musculoskeletal: The bony pelvis is intact. No pelvic fractures. The pubic symphysis and SI joints are maintained. Both hips are normally located. The lumbar vertebral bodies are normally aligned. No fractures are identified. Review of the MIP images confirms the above findings. IMPRESSION: 1. Grade 4 splenic injury with multiple large lacerations extending to the splenic hilum, small vascular injuries with contrast extravasation and moderate volume abdominal/pelvic hemoperitoneum. 2. No CT findings for pulmonary embolism. 3. No acute pulmonary findings or pneumothorax. 4. No acute bony findings. 5. Diffuse fatty infiltration of the liver. These results were called by telephone at the time of interpretation on 02/23/2020 at 2:18 pm to provider Geoffery Lyons , who verbally acknowledged these results. Electronically Signed   By: Rudie Meyer M.D.   On: 02/23/2020 14:18   CT ABDOMEN PELVIS W CONTRAST  Result Date: 02/23/2020 CLINICAL DATA:  Larey Seat last evening. Left-sided chest pain. Syncopal episode. EXAM: CT ANGIOGRAPHY CHEST CT ABDOMEN AND PELVIS WITH CONTRAST TECHNIQUE: Multidetector CT imaging of the chest was performed using the standard protocol during bolus administration of  intravenous contrast. Multiplanar CT image reconstructions and MIPs were obtained to evaluate the vascular anatomy. Multidetector CT imaging of the abdomen and pelvis was performed using the standard protocol during bolus administration of intravenous contrast. CONTRAST:   OMNIPAQUE IOHEXOL 350 MG/ML SOLN COMPARISON:  None. FINDINGS: CTA CHEST FINDINGS Cardiovascular: The heart is normal in size. No pericardial effusion. The aorta is normal in caliber. No dissection. No atherosclerotic calcifications. The branch vessels are patent. No coronary artery calcifications. The pulmonary arteries appear normal. No filling defects to suggest pulmonary embolism. Mediastinum/Nodes: No mediastinal or hilar mass or adenopathy or hematoma. The esophagus is grossly normal. Lungs/Pleura: No acute pulmonary findings. No pulmonary contusion or pneumothorax. No pleural effusion. Musculoskeletal: Musculoskeletal no rib, sternal or thoracic vertebral body fractures are identified. There is a remote healed left lateral rib fracture noted. Review of the MIP images confirms the above findings. CT ABDOMEN and PELVIS FINDINGS Hepatobiliary: No acute hepatic injury is identified. No hepatic lesions. There is diffuse fatty infiltration of the liver noted. The portal and hepatic veins are patent. The gallbladder is unremarkable. No common bile duct dilatation. Pancreas: No mass, inflammation or ductal dilatation. No acute injury. Spleen: Significant splenic injury is demonstrated. Multiple large lacerations extending to the splenic hilum with areas of active bleeding/extravasation suspected. There is a large subcapsular hematoma along with moderate abdominal/pelvic hemoperitoneum. Findings consistent with a grade 4 splenic injury. Adrenals/Urinary Tract: The adrenal glands and kidneys are unremarkable. No acute renal injury or perinephric fluid collection. Stomach/Bowel: The stomach, duodenum, small bowel and colon are unremarkable. No acute inflammatory changes, mass lesions or obstructive findings. Vascular/Lymphatic: The aorta and branch vessels are patent. The major venous structures are patent. No mesenteric or retroperitoneal mass or adenopathy. Reproductive: Reproductive the uterus and ovaries are  unremarkable. Tubal ligation clips are noted bilaterally. Other: Moderate volume abdominal/pelvic hemoperitoneum Musculoskeletal: The bony pelvis is intact. No pelvic fractures. The pubic symphysis and SI joints are maintained. Both hips are normally located. The lumbar vertebral bodies are normally aligned. No fractures are identified. Review of the MIP images confirms the above findings. IMPRESSION: 1. Grade 4 splenic injury with multiple large lacerations extending to the splenic hilum, small vascular injuries with contrast extravasation and moderate volume abdominal/pelvic hemoperitoneum. 2. No CT findings for pulmonary embolism. 3. No acute pulmonary findings or pneumothorax. 4. No acute bony findings. 5. Diffuse fatty infiltration of the liver. These results were called by telephone at the time of interpretation on 02/23/2020 at 2:18 pm to provider Geoffery Lyons , who verbally acknowledged these results. Electronically Signed   By: Rudie Meyer M.D.   On: 02/23/2020 14:18    Procedures Procedures (including critical care time)  Medications Ordered in ED Medications  sodium chloride 0.9 % bolus 1,000 mL (0 mLs Intravenous Stopped 02/23/20 1146)  ondansetron (ZOFRAN) injection 4 mg (4 mg Intravenous Given 02/23/20 1147)  morphine 4 MG/ML injection 4 mg (4 mg Intravenous Given 02/23/20 1147)  iohexol (OMNIPAQUE) 350 MG/ML injection 100 mL (100 mLs Intravenous Contrast Given 02/23/20 1317)    ED Course  I have reviewed the triage vital signs and the nursing notes.  Pertinent labs & imaging results that were available during my care of the patient were reviewed by me and considered in my medical decision making (see chart for details).    MDM Rules/Calculators/A&P  Patient sent here from urgent care with the above complaints.  She arrived here reporting shortness of breath and appeared very pale and anxious.  Work-up  initiated and IV hydration started.  Laboratory studies have returned  showing white cell count of 16,000 and hemoglobin of 12.9.  Remainder of laboratory studies essentially unremarkable.  Patient then went for CT scan of the chest, abdomen, and pelvis due to suspected pulmonary embolism.  This was negative for PE, but does show a grade 4 splenic laceration with contrast extravasation and hemoperitoneum.  This finding was discussed with Dr. Janee Morn from trauma surgery.  He is recommending patient transferred to the ER for trauma evaluation.  I have spoken with Dr. Rodena Medin who agrees to accept the patient in transfer.  I have reinterviewed the patient to inquire about the mechanism of these injuries.  She tells me she fell down a hill 2 days ago, then fell walking out of the bathroom and landed on a chair.  The mechanism of injury she is reporting is inconsistent with the degree of her injuries.  She has multiple contusions to her chest wall, right arm, and lip.  I specifically asked the patient whether someone was hurting her and she adamantly refutes this.    CRITICAL CARE Performed by: Geoffery Lyons Total critical care time: 70 minutes Critical care time was exclusive of separately billable procedures and treating other patients. Critical care was necessary to treat or prevent imminent or life-threatening deterioration. Critical care was time spent personally by me on the following activities: development of treatment plan with patient and/or surrogate as well as nursing, discussions with consultants, evaluation of patient's response to treatment, examination of patient, obtaining history from patient or surrogate, ordering and performing treatments and interventions, ordering and review of laboratory studies, ordering and review of radiographic studies, pulse oximetry and re-evaluation of patient's condition.   Final Clinical Impression(s) / ED Diagnoses Final diagnoses:  Laceration of spleen, initial encounter  Closed fracture of distal end of left radius,  unspecified fracture morphology, initial encounter    Rx / DC Orders ED Discharge Orders    None       Geoffery Lyons, MD 02/23/20 1450

## 2020-02-23 NOTE — ED Provider Notes (Signed)
On arrival to this facility I discussed the patient's case with our critical care transport team and trauma surgery. Patient is awake and alert.  Care assumed by trauma surgery.   Kara Munch, MD 02/23/20 [phone]

## 2020-02-23 NOTE — Op Note (Signed)
Date: 02/23/20  Patient: Kara Stewart MRN: [id number]  Preoperative Diagnosis: Grade 5 splenic laceration Postoperative Diagnosis: Same  Procedure: Exploratory laparotomy, splenectomy, liver biopsy  Surgeon: Sophronia Simas, MD Assistant: Lannette Donath, MD PGY-4 Resident  EBL: 1500 mL  Fluid/Blood: 900 mL crystalloid, 600 mL cell saver, 1 unit PRBCs (2 units PRBCs given en route to hospital)  Anesthesia: General  Specimens: Spleen, liver biopsy  Indications: Ms. Route is a 44 yo female who presented to the ED after multiple falls within the last week. She complained of abdominal pain and was tachycardic and hypotensive on arrival to urgent care. She was transferred to the ED at [facility], and found to have a ruptured spleen on imaging. Blood transfusion was initiated and she was transferred to [facility] for emergent surgery.  Findings: Hemoperitoneum and grade 5 splenic laceration with active bleeding at the splenic hilum. Nodular, steatotic liver suspicious for cirrhosis.  Procedure details: Informed consent was obtained in the preoperative area prior to the procedure. The patient was brought to the operating room and placed on the table in the supine position. General anesthesia was induced and appropriate lines and drains were placed for intraoperative monitoring. Perioperative antibiotics were administered per SCIP guidelines. The abdomen was prepped and draped in the usual sterile fashion. A pre-procedure timeout was taken verifying patient identity, surgical site and procedure to be performed.  An upper midline laparotomy incision was made, and the subcutaneous tissue was divided with cautery to expose the fascia. The fascia was elevated and incised, and opened along the linea alba. The peritoneum was then opened, and hemoperitoneum was encountered. The left upper quadrant was immediately packed with laparotomy pads. The remaining quadrants of the abdomen were explored with no signs  of active bleeding. The liver was pale, fatty and nodular in appearance but there were no lacerations. A Balfour retractor was placed and the LUQ packs were removed. The spleen was medialized by bluntly taking down the perisplenic attachments. The spleen was enlarged and lacerated into multiple fragments, with active bleeding at the hilar vessels. Once the spleen was mobilized, the hilum was ligated with a 28mm echelon stapler with two white loads. The spleen was then passed off the field and sent for pathology. There was still significant active bleeding from the splenic vessels, the stumps of which were oversewn with 2-0 prolene and 2-0 silk in figure-of-eight fashion. The abdomen was then thoroughly irrigated with warmed saline. The LUQ appeared hemostatic with no further bleeding. The small bowel was run with no sign of injury. The colon and rectum were grossly normal in appearance. No bleeding was visualized in the LLQ, pelvis, RLQ or RUQ. The staple line on the splenic vessels was again inspected and appeared hemostatic, however appeared very close to the tail of the pancreas so a 19-Fr fluted JP drain was left along the splenic fossa and adjacent to the tail of the pancreas. The drain was brought out through the left abdominal wall and secured to the skin with a 2-0 nylon. A liver biopsy was taken from the left lateral liver using a 15-blade. Hemostasis was achieved at the biopsy site using cautery. The retractors were removed. The fascia was closed with a running looped 1 PDS. The skin was closed with staples and a sterile dressing was applied.  All counts were correct x2 at the end of the procedure. The patient was extubated and taken to PACU in guarded condition.  Sophronia Simas, MD 02/23/20 7:11 PM

## 2020-02-24 ENCOUNTER — Other Ambulatory Visit: Payer: Self-pay

## 2020-02-24 LAB — PREPARE FRESH FROZEN PLASMA: Unit division: 0

## 2020-02-24 LAB — PROTIME-INR
INR: 1.2 (ref 0.8–1.2)
Prothrombin Time: 14.3 seconds (ref 11.4–15.2)

## 2020-02-24 LAB — BPAM FFP
Blood Product Expiration Date: 202111232359
Blood Product Expiration Date: 202111232359
ISSUE DATE / TIME: 202111191749
ISSUE DATE / TIME: 202111191749
Unit Type and Rh: 6200
Unit Type and Rh: 6200

## 2020-02-24 LAB — BASIC METABOLIC PANEL
Anion gap: 10 (ref 5–15)
BUN: 8 mg/dL (ref 6–20)
CO2: 21 mmol/L — ABNORMAL LOW (ref 22–32)
Calcium: 7.3 mg/dL — ABNORMAL LOW (ref 8.9–10.3)
Chloride: 107 mmol/L (ref 98–111)
Creatinine, Ser: 0.96 mg/dL (ref 0.44–1.00)
GFR, Estimated: >60 mL/min (ref >60)
Glucose, Bld: 158 mg/dL — ABNORMAL HIGH (ref 70–99)
Potassium: 4.4 mmol/L (ref 3.5–5.1)
Sodium: 138 mmol/L (ref 135–145)

## 2020-02-24 LAB — BPAM RBC
Blood Product Expiration Date: 202112182359
Blood Product Expiration Date: 202112212359
ISSUE DATE / TIME: 202111191507
ISSUE DATE / TIME: 202111191507
Unit Type and Rh: 9500
Unit Type and Rh: 9500

## 2020-02-24 LAB — CBC
HCT: 33.6 % — ABNORMAL LOW (ref 36.0–46.0)
Hemoglobin: 11.5 g/dL — ABNORMAL LOW (ref 12.0–15.0)
MCH: 32.8 pg (ref 26.0–34.0)
MCHC: 34.2 g/dL (ref 30.0–36.0)
MCV: 95.7 fL (ref 80.0–100.0)
Platelets: 99 10*3/uL — ABNORMAL LOW (ref 150–400)
RBC: 3.51 MIL/uL — ABNORMAL LOW (ref 3.87–5.11)
RDW: 20.4 % — ABNORMAL HIGH (ref 11.5–15.5)
WBC: 21.6 10*3/uL — ABNORMAL HIGH (ref 4.0–10.5)
nRBC: 0 % (ref 0.0–0.2)

## 2020-02-24 LAB — TYPE AND SCREEN
ABO/RH(D): A POS
Antibody Screen: NEGATIVE
Unit division: 0
Unit division: 0

## 2020-02-24 LAB — MRSA PCR SCREENING: MRSA by PCR: NEGATIVE

## 2020-02-24 LAB — T4, FREE: Free T4: 1.03 ng/dL (ref 0.61–1.12)

## 2020-02-24 LAB — TSH: TSH: 2.958 u[IU]/mL (ref 0.350–4.500)

## 2020-02-24 MED ORDER — FOLIC ACID 1 MG PO TABS
1.0000 mg | ORAL_TABLET | Freq: Every day | ORAL | Status: DC
  Administered 2020-02-24 – 2020-03-03 (×9): 1 mg via ORAL
  Filled 2020-02-24 (×9): qty 1

## 2020-02-24 MED ORDER — OXYCODONE HCL 5 MG PO TABS
5.0000 mg | ORAL_TABLET | ORAL | Status: DC | PRN
  Administered 2020-02-24 – 2020-02-29 (×16): 10 mg via ORAL
  Administered 2020-02-29: 5 mg via ORAL
  Administered 2020-03-01 – 2020-03-02 (×5): 10 mg via ORAL
  Filled 2020-02-24 (×9): qty 2
  Filled 2020-02-24: qty 1
  Filled 2020-02-24 (×5): qty 2
  Filled 2020-02-24: qty 1
  Filled 2020-02-24 (×8): qty 2

## 2020-02-24 MED ORDER — METHOCARBAMOL 500 MG PO TABS
1000.0000 mg | ORAL_TABLET | Freq: Three times a day (TID) | ORAL | Status: DC
  Administered 2020-02-24 – 2020-02-25 (×6): 1000 mg via ORAL
  Filled 2020-02-24 (×7): qty 2

## 2020-02-24 MED ORDER — ENOXAPARIN SODIUM 30 MG/0.3ML ~~LOC~~ SOLN
30.0000 mg | Freq: Two times a day (BID) | SUBCUTANEOUS | Status: DC
  Administered 2020-02-24 – 2020-02-26 (×4): 30 mg via SUBCUTANEOUS
  Filled 2020-02-24 (×4): qty 0.3

## 2020-02-24 MED ORDER — OXYCODONE HCL 5 MG/5ML PO SOLN
5.0000 mg | ORAL | Status: DC | PRN
  Administered 2020-02-24: 10 mg via ORAL
  Filled 2020-02-24: qty 10

## 2020-02-24 MED ORDER — ACETAMINOPHEN 500 MG PO TABS
1000.0000 mg | ORAL_TABLET | Freq: Four times a day (QID) | ORAL | Status: DC
  Administered 2020-02-24 – 2020-02-25 (×6): 1000 mg via ORAL
  Filled 2020-02-24 (×7): qty 2

## 2020-02-24 MED ORDER — METOPROLOL TARTRATE 12.5 MG HALF TABLET
12.5000 mg | ORAL_TABLET | Freq: Two times a day (BID) | ORAL | Status: DC
  Administered 2020-02-24 – 2020-02-29 (×8): 12.5 mg via ORAL
  Filled 2020-02-24 (×11): qty 1

## 2020-02-24 MED ORDER — KETOROLAC TROMETHAMINE 15 MG/ML IJ SOLN
30.0000 mg | Freq: Four times a day (QID) | INTRAMUSCULAR | Status: DC | PRN
  Administered 2020-02-24 – 2020-02-26 (×2): 30 mg via INTRAVENOUS
  Filled 2020-02-24 (×2): qty 2

## 2020-02-24 MED ORDER — THIAMINE HCL 100 MG PO TABS
100.0000 mg | ORAL_TABLET | Freq: Every day | ORAL | Status: DC
  Administered 2020-02-24 – 2020-03-03 (×9): 100 mg via ORAL
  Filled 2020-02-24 (×9): qty 1

## 2020-02-24 MED ORDER — PROSIGHT PO TABS
1.0000 | ORAL_TABLET | Freq: Every day | ORAL | Status: DC
  Administered 2020-02-24 – 2020-03-03 (×9): 1 via ORAL
  Filled 2020-02-24 (×9): qty 1

## 2020-02-24 NOTE — Progress Notes (Signed)
Trauma/Critical Care Follow Up Note  Subjective:    Overnight Issues:   Objective:  Vital signs for last 24 hours: Temp:  [97 F (36.1 C)-99 F (37.2 C)] 99 F (37.2 C) (11/20 0400) Pulse Rate:  [87-142] 102 (11/20 0700) Resp:  [12-30] 15 (11/20 0700) BP: (85-163)/(52-99) 144/80 (11/20 0700) SpO2:  [94 %-100 %] 95 % (11/20 0700) Weight:  [73.9 kg] 73.9 kg (11/19 2015)  Hemodynamic parameters for last 24 hours:    Intake/Output from previous day: 11/19 0701 - 11/20 0700 In: 2465.5 [I.V.:1565.5; Blood:900] Out: 1935 [Urine:665; Drains:370; Blood:900]  Intake/Output this shift: No intake/output data recorded.  Vent settings for last 24 hours:    Physical Exam:  Gen: comfortable, no distress Neuro: non-focal exam HEENT: PERRL Neck: supple CV: RRR Pulm: unlabored breathing on [place] Abd: soft, NT, midline with minimal SS drainage on dressing GU: clear yellow urine, foley Extr: wwp, no edema   Results for orders placed or performed during the hospital encounter of 02/23/20 (from the past 24 hour(s))  Comprehensive metabolic panel     Status: Abnormal   Collection Time: 02/23/20  9:47 AM  Result Value Ref Range   Sodium 138 135 - 145 mmol/L   Potassium 3.6 3.5 - 5.1 mmol/L   Chloride 104 98 - 111 mmol/L   CO2 20 (L) 22 - 32 mmol/L   Glucose, Bld 182 (H) 70 - 99 mg/dL   BUN 6 6 - 20 mg/dL   Creatinine, Ser 3.66 0.44 - 1.00 mg/dL   Calcium 8.5 (L) 8.9 - 10.3 mg/dL   Total Protein 6.1 (L) 6.5 - 8.1 g/dL   Albumin 3.5 3.5 - 5.0 g/dL   AST 294 (H) 15 - 41 U/L   ALT 67 (H) 0 - 44 U/L   Alkaline Phosphatase 73 38 - 126 U/L   Total Bilirubin 1.5 (H) 0.3 - 1.2 mg/dL   GFR, Estimated >76 >54 mL/min   Anion gap 14 5 - 15  Brain natriuretic peptide     Status: None   Collection Time: 02/23/20  9:47 AM  Result Value Ref Range   B Natriuretic Peptide 54.0 0.0 - 100.0 pg/mL  Troponin I (High Sensitivity)     Status: None   Collection Time: 02/23/20  9:47 AM  Result  Value Ref Range   Troponin I (High Sensitivity) 2 <18 ng/L  CBC with Differential     Status: Abnormal   Collection Time: 02/23/20  9:47 AM  Result Value Ref Range   WBC 16.2 (H) 4.0 - 10.5 K/uL   RBC 3.41 (L) 3.87 - 5.11 MIL/uL   Hemoglobin 12.9 12.0 - 15.0 g/dL   HCT 65.0 36 - 46 %   MCV 106.2 (H) 80.0 - 100.0 fL   MCH 37.8 (H) 26.0 - 34.0 pg   MCHC 35.6 30.0 - 36.0 g/dL   RDW 35.4 65.6 - 81.2 %   Platelets 205 150 - 400 K/uL   nRBC 0.0 0.0 - 0.2 %   Neutrophils Relative % 86 %   Neutro Abs 14.0 (H) 1.7 - 7.7 K/uL   Lymphocytes Relative 7 %   Lymphs Abs 1.1 0.7 - 4.0 K/uL   Monocytes Relative 5 %   Monocytes Absolute 0.8 0.1 - 1.0 K/uL   Eosinophils Relative 2 %   Eosinophils Absolute 0.3 0.0 - 0.5 K/uL   Basophils Relative 0 %   Basophils Absolute 0.1 0.0 - 0.1 K/uL   Immature Granulocytes 0 %   Abs  Immature Granulocytes 0.05 0.00 - 0.07 K/uL  Protime-INR     Status: None   Collection Time: 02/23/20  9:47 AM  Result Value Ref Range   Prothrombin Time 14.0 11.4 - 15.2 seconds   INR 1.1 0.8 - 1.2  hCG, quantitative, pregnancy     Status: None   Collection Time: 02/23/20  9:47 AM  Result Value Ref Range   hCG, Beta Chain, Quant, S <1 <5 mIU/mL  Lactic acid, plasma     Status: Abnormal   Collection Time: 02/23/20  9:49 AM  Result Value Ref Range   Lactic Acid, Venous 3.3 (HH) 0.5 - 1.9 mmol/L  CBG monitoring, ED     Status: Abnormal   Collection Time: 02/23/20 10:15 AM  Result Value Ref Range   Glucose-Capillary 185 (H) 70 - 99 mg/dL  Blood culture (routine x 2)     Status: None (Preliminary result)   Collection Time: 02/23/20 10:21 AM   Specimen: Hand; Blood  Result Value Ref Range   Specimen Description HAND RIGHT    Special Requests      BOTTLES DRAWN AEROBIC AND ANAEROBIC Blood Culture adequate volume Performed at [facility], [address], [place], [place] [address]    Culture PENDING    Report Status PENDING   Lactic acid, plasma     Status: Abnormal    Collection Time: 02/23/20 12:36 PM  Result Value Ref Range   Lactic Acid, Venous 2.0 (HH) 0.5 - 1.9 mmol/L  Troponin I (High Sensitivity)     Status: None   Collection Time: 02/23/20 12:36 PM  Result Value Ref Range   Troponin I (High Sensitivity) <2 <18 ng/L  Blood culture (routine x 2)     Status: None (Preliminary result)   Collection Time: 02/23/20 12:48 PM   Specimen: Arm; Blood  Result Value Ref Range   Specimen Description ARM RIGHT    Special Requests      BOTTLES DRAWN AEROBIC AND ANAEROBIC Blood Culture adequate volume Performed at [facility], [address] [place], [place] [address]    Culture PENDING    Report Status PENDING   Resp Panel by RT-PCR (Flu A&B, Covid) Nasopharyngeal Swab     Status: None   Collection Time: 02/23/20  2:35 PM   Specimen: Nasopharyngeal Swab; Nasopharyngeal(NP) swabs in vial transport medium  Result Value Ref Range   SARS Coronavirus 2 by RT PCR NEGATIVE NEGATIVE   Influenza A by PCR NEGATIVE NEGATIVE   Influenza B by PCR NEGATIVE NEGATIVE  Type and screen     Status: None (Preliminary result)   Collection Time: 02/23/20  2:45 PM  Result Value Ref Range   ABO/RH(D) A POS    Antibody Screen NEG    Sample Expiration      02/26/2020,2359 Performed at [facility], [address], [place], [place] [address]    Unit Number [id number]    Blood Component Type RED CELLS,LR    Unit division 00    Status of Unit ISSUED    Transfusion Status OK TO TRANSFUSE    Crossmatch Result COMPATIBLE    Unit Number [id number]    Blood Component Type RBC LR PHER2    Unit division 00    Status of Unit ISSUED    Transfusion Status OK TO TRANSFUSE    Crossmatch Result COMPATIBLE   Type and screen [facility]     Status: None (Preliminary result)   Collection Time: 02/23/20  4:42 PM  Result  Value Ref Range   ABO/RH(D) A POS    Antibody Screen NEG    Sample Expiration 02/26/2020,2359    Unit Number [id number]    Blood  Component Type RED CELLS,LR    Unit division 00    Status of Unit ISSUED    Transfusion Status OK TO TRANSFUSE    Crossmatch Result Compatible    Unit Number [id number]    Blood Component Type RED CELLS,LR    Unit division 00    Status of Unit ALLOCATED    Transfusion Status OK TO TRANSFUSE    Crossmatch Result      Compatible Performed at [facility], [address], [place], [place] [address]   Prepare fresh frozen plasma     Status: None (Preliminary result)   Collection Time: 02/23/20  4:42 PM  Result Value Ref Range   Unit Number [id number]    Blood Component Type THW PLS APHR    Unit division 00    Status of Unit ISSUED    Transfusion Status OK TO TRANSFUSE    Unit Number [id number]    Blood Component Type THW PLS APHR    Unit division B0    Status of Unit ISSUED    Transfusion Status OK TO TRANSFUSE   HIV Antibody (routine testing w rflx)     Status: None   Collection Time: 02/23/20  9:09 PM  Result Value Ref Range   HIV Screen 4th Generation wRfx Non Reactive Non Reactive  CBC     Status: Abnormal   Collection Time: 02/23/20  9:09 PM  Result Value Ref Range   WBC 21.7 (H) 4.0 - 10.5 K/uL   RBC 3.77 (L) 3.87 - 5.11 MIL/uL   Hemoglobin 12.5 12.0 - 15.0 g/dL   HCT 93.5 36 - 46 %   MCV 97.3 80.0 - 100.0 fL   MCH 33.2 26.0 - 34.0 pg   MCHC 34.1 30.0 - 36.0 g/dL   RDW 70.1 (H) 77.9 - 39.0 %   Platelets 83 (L) 150 - 400 K/uL   nRBC 0.1 0.0 - 0.2 %  Basic metabolic panel     Status: Abnormal   Collection Time: 02/23/20  9:09 PM  Result Value Ref Range   Sodium 138 135 - 145 mmol/L   Potassium 4.2 3.5 - 5.1 mmol/L   Chloride 107 98 - 111 mmol/L   CO2 18 (L) 22 - 32 mmol/L   Glucose, Bld 174 (H) 70 - 99 mg/dL   BUN 6 6 - 20 mg/dL   Creatinine, Ser 3.00 0.44 - 1.00 mg/dL   Calcium 7.1 (L) 8.9 - 10.3 mg/dL   GFR, Estimated >92 >33 mL/min   Anion gap 13 5 - 15  Protime-INR     Status: None   Collection Time: 02/23/20  9:09 PM  Result Value Ref  Range   Prothrombin Time 14.7 11.4 - 15.2 seconds   INR 1.2 0.8 - 1.2  APTT     Status: None   Collection Time: 02/23/20  9:09 PM  Result Value Ref Range   aPTT 25 24 - 36 seconds  MRSA PCR Screening     Status: None   Collection Time: 02/23/20 10:22 PM   Specimen: Nasal Mucosa; Nasopharyngeal  Result Value Ref Range   MRSA by PCR NEGATIVE NEGATIVE  CBC     Status: Abnormal   Collection Time: 02/24/20  4:33 AM  Result Value Ref Range   WBC 21.6 (H) 4.0 - 10.5  K/uL   RBC 3.51 (L) 3.87 - 5.11 MIL/uL   Hemoglobin 11.5 (L) 12.0 - 15.0 g/dL   HCT 40.933.6 (L) 36 - 46 %   MCV 95.7 80.0 - 100.0 fL   MCH 32.8 26.0 - 34.0 pg   MCHC 34.2 30.0 - 36.0 g/dL   RDW 81.120.4 (H) 91.411.5 - 78.215.5 %   Platelets 99 (L) 150 - 400 K/uL   nRBC 0.0 0.0 - 0.2 %  Basic metabolic panel     Status: Abnormal   Collection Time: 02/24/20  4:33 AM  Result Value Ref Range   Sodium 138 135 - 145 mmol/L   Potassium 4.4 3.5 - 5.1 mmol/L   Chloride 107 98 - 111 mmol/L   CO2 21 (L) 22 - 32 mmol/L   Glucose, Bld 158 (H) 70 - 99 mg/dL   BUN 8 6 - 20 mg/dL   Creatinine, Ser 9.560.96 0.44 - 1.00 mg/dL   Calcium 7.3 (L) 8.9 - 10.3 mg/dL   GFR, Estimated >21>60 >30>60 mL/min   Anion gap 10 5 - 15  Protime-INR     Status: None   Collection Time: 02/24/20  4:33 AM  Result Value Ref Range   Prothrombin Time 14.3 11.4 - 15.2 seconds   INR 1.2 0.8 - 1.2    Assessment & Plan: The plan of care was discussed with the bedside nurse for the day, Trish, who is in agreement with this plan and no additional concerns were raised.   Present on Admission: . Splenic laceration    LOS: 1 day   Additional comments:I reviewed the patient's new clinical lab test results.   and I reviewed the patients new imaging test results.    Fall  G5 splenic laceration - s/p ex lap, splenectomy by Dr. Freida BusmanAllen 11/19. AROBF Left impacted distal radius fracture - Hand c/s, Dr. Merlyn LotKuzma, notified 11/19, confirmed notification 11/20 at 0745, continue splint, may f/u  as o/p if ready for discharge prior to being seen in the hospital Hx SVT - metoprolol 12.5 BID started today Hx Hypothyroidism - reports this is untreated, check TFTs Hx of prior alcohol Abuse - MVI/B1/folate FEN - NPO with sips/chips, IVF  VTE - SCDs, LMWH to start this PM ID - splenectomy vaccines at discharge or 14d post-op, whichever comes first Foley- remove Dispo - TTF, PT/OT/OOB   Diamantina MonksAyesha N. Marbeth Smedley, MD Trauma & General Surgery Please use [email or web address] to contact on call provider  02/24/2020  *Care during the described time interval was provided by me. I have reviewed this patient's available data, including medical history, events of note, physical examination and test results as part of my evaluation.

## 2020-02-24 NOTE — Anesthesia Postprocedure Evaluation (Signed)
Anesthesia Post Note  Patient: Kara Stewart  Procedure(s) Performed: SPLENECTOMY (N/A ) OPEN LIVER BIOPSY     Patient location during evaluation: PACU Anesthesia Type: General Level of consciousness: awake and alert Pain management: pain level controlled Vital Signs Assessment: post-procedure vital signs reviewed and stable Respiratory status: spontaneous breathing, nonlabored ventilation, respiratory function stable and patient connected to nasal cannula oxygen Cardiovascular status: blood pressure returned to baseline and stable Postop Assessment: no apparent nausea or vomiting Anesthetic complications: no   No complications documented.  Last Vitals:  Vitals:   02/24/20 0600 02/24/20 0700  BP: 99/82 (!) 144/80  Pulse: (!) 101 (!) 102  Resp: 16 15  Temp:    SpO2: 95% 95%    Last Pain:  Vitals:   02/24/20 0412  TempSrc:   PainSc: 7                  Jeramie Scogin S
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
## 2020-02-24 NOTE — Progress Notes (Signed)
Pt prefers all four siderails up on bed for safety and security.

## 2020-02-24 NOTE — Progress Notes (Signed)
Pt in pain 10/10 >1 hr after receiving oxycodone. Pt had Tylenol as only other pain medication option. Pt refused Tylenol at 1130 on previous unit d/t history of liver issues. Paged Dr. Vallery Ridge for possible options for pain management other than Tylenol between oxycodone doses d/t liver issues, and oxycodone therapy dosing for PT tomorrow. Dr. Vallery Ridge stated pt needed to be taking PO meds ordered and added Ketorolac PRN for pain as well.

## 2020-02-24 NOTE — Progress Notes (Addendum)
Foley catheter removed at this time without complication. Will monitor for void. Patient educated with good understanding verbalized.   Patient assisted out of bed to chair with staff assist. Resting comfortably.

## 2020-02-24 NOTE — Progress Notes (Signed)
Patient transferred to 6N at this time. Report given to [facility]. All belongings transferred with patient- clothes, wallet, and cell phone.  Patient states she had her glasses when she went to PACU. No glasses found in room. Unit secretary to inquire with PACU. VSS  Vitals:   02/24/20 1300 02/24/20 1408  BP: 140/74 137/77  Pulse: 85 87  Resp: 15 17  Temp:    SpO2: 95%

## 2020-02-24 NOTE — Evaluation (Signed)
Physical Therapy Evaluation Patient Details Name: Kara Stewart MRN: [id number] DOB: 05-Aug-1975 Today's Date: 02/24/2020   History of Present Illness  44 y.o. female with a history of prior alcohol abuse w/ alcohol related hepatitis and hypothyroidism who was transferred from [facility] after a fall and found to have laceration and left impacted distal radius fracture. Pt found to have grade 5 spleen laceration, L impacted radius fx. 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 Pt is limited by NWB LUE as well as abdominal pain at this time. Pt requiring some physical assistance due to strength deficits during transfers. PT assessment of ambulation is limited by pain but also by pt reports of lightheadedness. Pt will benefit from continued acute PT services to improve activity tolerance and gait quality. PT anticipates the pt will progress to discharging home with HHPT, DME assessment to be continued. Pt does have a history of falls, if gait does not progress well by Monday may need to consider inpatient placement.    Follow Up Recommendations Home health PT;Supervision - Intermittent    Equipment Recommendations  Cane (TBD with progression of mobility)    Recommendations for Other Services       Precautions / Restrictions Precautions Precautions: Fall Required Braces or Orthoses: Splint/Cast Splint/Cast: L wrist splint Restrictions Weight Bearing Restrictions: Yes LUE Weight Bearing: Non weight bearing      Mobility  Bed Mobility Overal bed mobility: Needs Assistance Bed Mobility: Rolling;Sidelying to Sit;Sit to Supine Rolling: Min guard Sidelying to sit: Min assist   Sit to supine: Min assist        Transfers Overall transfer level: Needs assistance Equipment used: 1 person hand held assist Transfers: Sit to/from Stand Sit to Stand: Min  assist            Ambulation/Gait Ambulation/Gait assistance: Min guard Gait Distance (Feet): 3 Feet Assistive device: 1 person hand held assist Gait Pattern/deviations: Step-to pattern Gait velocity: reduced Gait velocity interpretation: <1.31 ft/sec, indicative of household ambulator General Gait Details: side step up to head of bed  Stairs            Wheelchair Mobility    Modified Rankin (Stroke Patients Only)       Balance Overall balance assessment: Needs assistance Sitting-balance support: No upper extremity supported;Feet supported Sitting balance-Leahy Scale: Good Sitting balance - Comments: supervision   Standing balance support: Single extremity supported Standing balance-Leahy Scale: Poor Standing balance comment: reliant on UE support, minG                             Pertinent Vitals/Pain Pain Assessment: Faces Faces Pain Scale: Hurts even more Pain Location: abdomen Pain Descriptors / Indicators: Sore Pain Intervention(s): Monitored during session    Home Living Family/patient expects to be discharged to:: Private residence Living Arrangements: Spouse/significant other (possibly child also, PT unsure if child is 18 years or month) Available Help at Discharge: Family;Available PRN/intermittently Type of Home: House Home Access: Stairs to enter Entrance Stairs-Rails: Can reach both Entrance Stairs-Number of Steps: 4 Home Layout: One level Home Equipment: None      Prior Function Level of Independence: Independent         Comments: pt does report history of falls due to nerve issue in back resulting in L leg buckling     Hand Dominance  Extremity/Trunk Assessment   Upper Extremity Assessment Upper Extremity Assessment: LUE deficits/detail LUE Deficits / Details: elbow and shoulder ROM WFL, hand/wrist not assessed    Lower Extremity Assessment Lower Extremity Assessment: Generalized weakness    Cervical /  Trunk Assessment Cervical / Trunk Assessment: Other exceptions Cervical / Trunk Exceptions: dressing intact, clean and with no new drainage noted  Communication   Communication: No difficulties  Cognition Arousal/Alertness: Awake/alert Behavior During Therapy: WFL for tasks assessed/performed Overall Cognitive Status: Within Functional Limits for tasks assessed                                        General Comments General comments (skin integrity, edema, etc.): VSS on 2L [place], pt does reports some lightheadedness with mobility, BP stable 113/71    Exercises     Assessment/Plan    PT Assessment Patient needs continued PT services  PT Problem List Decreased strength;Decreased activity tolerance;Decreased balance;Decreased mobility;Decreased knowledge of use of DME;Decreased knowledge of precautions;Cardiopulmonary status limiting activity;Pain       PT Treatment Interventions DME instruction;Gait training;Stair training;Functional mobility training;Therapeutic activities;Therapeutic exercise;Balance training;Neuromuscular re-education;Patient/family education    PT Goals (Current goals can be found in the Care Plan section)  Acute Rehab PT Goals Patient Stated Goal: To return to independent mobility PT Goal Formulation: With patient Time For Goal Achievement: 03/09/20 Potential to Achieve Goals: Good    Frequency Min 5X/week   Barriers to discharge        Co-evaluation               AM-PAC PT "6 Clicks" Mobility  Outcome Measure Help needed turning from your back to your side while in a flat bed without using bedrails?: A Little Help needed moving from lying on your back to sitting on the side of a flat bed without using bedrails?: A Little Help needed moving to and from a bed to a chair (including a wheelchair)?: A Little Help needed standing up from a chair using your arms (e.g., wheelchair or bedside chair)?: A Little Help needed to walk in  hospital room?: A Little Help needed climbing 3-5 steps with a railing? : A Lot 6 Click Score: 17    End of Session Equipment Utilized During Treatment: Oxygen Activity Tolerance: Patient limited by pain Patient left: in bed;with call bell/phone within reach;with bed alarm set Nurse Communication: Mobility status PT Visit Diagnosis: Other abnormalities of gait and mobility (R26.89);Pain Pain - Right/Left: Left Pain - part of body: Hand (abdomen)    Time: 0347-4259 PT Time Calculation (min) (ACUTE ONLY): 22 min   Charges:   PT Evaluation $PT Eval Moderate Complexity: 1 Mod          Arlyss Gandy, PT, DPT Acute Rehabilitation Pager: [phone]   Arlyss Gandy 02/24/2020, 5:38 PM

## 2020-02-24 NOTE — Consult Note (Signed)
Kara Stewart is an 44 y.o. female.   Consult regarding: left wrist fracture HPI: 44 yo female states she fell 02/22/20 injuring left wrist.  Presented to urgent care the following day.  XR revealed left distal radius fracture.  Also found to have splenic laceration and underwent surgery 02/23/20 after transfer to [facility].  State she has not previously injured left wrist.  Notes throbbing pain in wrist.    Xrays viewed and interpreted by me: 2 views left forearm show distal radius fracture with shortening and loss of radial inclination.   Labs reviewed: none  Allergies:  Allergies  Allergen Reactions  . Penicillins Anaphylaxis    Has patient had a PCN reaction causing immediate rash, facial/tongue/throat swelling, SOB or lightheadedness with hypotension: yes Has patient had a PCN reaction causing severe rash involving mucus membranes or skin necrosis: No Has patient had a PCN reaction that required hospitalization Yes Has patient had a PCN reaction occurring within the last 10 years: No If all of the above answers are "NO", then may proceed with Cephalosporin use.   . Lidocaine Other (See Comments)    Can use topical lidocaine but if ingested causes Hallucinations.    Past Medical History:  Diagnosis Date  . Acid reflux   . 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 Alcoholic hepatitis without ascites   . Anxiety   . Asthma   . Colitis   . GERD (gastroesophageal reflux disease) 07/21/2012  . Hiatal hernia   . History of alcohol abuse 04/26/2015  . Hypertriglyceridemia   . Hypothyroid   . Iritis    frequent  . Ovarian cyst   . Panic attacks   . SVT (supraventricular tachycardia) (HCC)    last issue 12/2017    Past Surgical History:  Procedure Laterality Date  . BIOPSY N/A 02/05/2015   Procedure: BIOPSY;  Surgeon: West BaliSandi L Fields, MD;  Location: [facility];  Service: Endoscopy;  Laterality: N/A;  . COLONOSCOPY    . COLONOSCOPY WITH PROPOFOL N/A 02/05/2015   GNF:AOZHYSLF:small HH/mild diverticulosis  .  ESOPHAGEAL DILATION N/A 11/21/2015   Procedure: ESOPHAGEAL DILATION;  Surgeon: Corbin Adeobert M Rourk, MD;  Location: [facility];  Service: Endoscopy;  Laterality: N/A;  . ESOPHAGOGASTRODUODENOSCOPY  11/06/2011   [name]: MILD Esophagitis/PATENT ESOPHAGEAL Stricture/  Moderate gastritis. Bx no.hpylori or celiac, +gastritis  . ESOPHAGOGASTRODUODENOSCOPY (EGD) WITH PROPOFOL N/A 03/20/2014   [name]: 1. Mild esophagitis & distal esophagela stricture. 2. small hiatal hernia 3. moderate non-erosive gastritis and mild duodenits  . ESOPHAGOGASTRODUODENOSCOPY (EGD) WITH PROPOFOL N/A 11/21/2015   Dr. Jena Gaussourk: LA grade B esophagitis, MW tear likely source of hematemesis  . LAPAROSCOPIC TUBAL LIGATION Bilateral 09/14/2018   Procedure: LAPAROSCOPIC TUBAL LIGATION;  Surgeon: Allie Bossierove, Myra C, MD;  Location: [facility];  Service: Gynecology;  Laterality: Bilateral;  . SAVORY DILATION N/A 03/20/2014   Procedure: SAVORY DILATION;  Surgeon: West BaliSandi L Fields, MD;  Location: [facility];  Service: Endoscopy;  Laterality: N/A;  dilated with # 12.8, 14,15,16  . TOOTH EXTRACTION      Family History: Family History  Problem Relation Age of Onset  . Stomach cancer Paternal Grandfather        colon cancer  . Cancer Paternal Grandfather        throat and esophagus  . Breast cancer Maternal Grandmother   . Cancer Maternal Grandmother        skin  . Anxiety disorder Maternal Grandmother   . Hypertension Mother   . Other Mother  fatty liver  . Hyperlipidemia Mother   . Liver disease Mother        fatty liver, does not drink.   . Sleep apnea Mother   . Other Father        varicose veins; stomach issues; hernia  . Hypertension Father   . Hyperlipidemia Father   . Cancer Father        prostate  . Arthritis Father        rheumatoid  . Neuropathy Father   . Rheum arthritis Father   . Thyroid disease Sister   . Hypertension Sister   . Other Brother        hernia  . Diabetes Maternal Grandfather   . Heart disease  Maternal Grandfather   . Other Paternal Grandmother        hernia  . COPD Paternal Grandmother   . Diabetes Paternal Grandmother   . Healthy Daughter     Social History:   reports that she has been smoking cigarettes. She has never used smokeless tobacco. She reports that she does not drink alcohol and does not use drugs.  Medications: Medications Prior to Admission  Medication Sig Dispense Refill  . albuterol (PROVENTIL HFA;VENTOLIN HFA) 108 (90 Base) MCG/ACT inhaler Inhale 2 puffs into the lungs every 6 (six) hours as needed for wheezing or shortness of breath. 1 Inhaler 0  . Cholecalciferol (VITAMIN D3 PO) Take 1 tablet by mouth daily.     . Cyanocobalamin (VITAMIN B-12 IJ) Inject as directed every 30 (thirty) days.    . folic acid (FOLVITE) 1 MG tablet TAKE 1 TABLET BY MOUTH EVERY DAY (Patient taking differently: Take 1 mg by mouth daily. ) 90 tablet 0  . ibuprofen (ADVIL) 600 MG tablet Take 600 mg by mouth every 6 (six) hours as needed for fever, headache or mild pain.    Marland Kitchen MILK THISTLE PO Take 1 tablet by mouth daily.     . ursodiol (ACTIGALL) 500 MG tablet TAKE 1 TABLET BY MOUTH TWICE A DAY (Patient taking differently: Take 500 mg by mouth in the morning and at bedtime. ) 60 tablet 3    Results for orders placed or performed during the hospital encounter of 02/23/20 (from the past 48 hour(s))  Comprehensive metabolic panel     Status: Abnormal   Collection Time: 02/23/20  9:47 AM  Result Value Ref Range   Sodium 138 135 - 145 mmol/L   Potassium 3.6 3.5 - 5.1 mmol/L   Chloride 104 98 - 111 mmol/L   CO2 20 (L) 22 - 32 mmol/L   Glucose, Bld 182 (H) 70 - 99 mg/dL    Comment: Glucose reference range applies only to samples taken after fasting for at least 8 hours.   BUN 6 6 - 20 mg/dL   Creatinine, Ser 2.95 0.44 - 1.00 mg/dL   Calcium 8.5 (L) 8.9 - 10.3 mg/dL   Total Protein 6.1 (L) 6.5 - 8.1 g/dL   Albumin 3.5 3.5 - 5.0 g/dL   AST 284 (H) 15 - 41 U/L   ALT 67 (H) 0 - 44 U/L    Alkaline Phosphatase 73 38 - 126 U/L   Total Bilirubin 1.5 (H) 0.3 - 1.2 mg/dL   GFR, Estimated >13 >24 mL/min    Comment: (NOTE) Calculated using the CKD-EPI Creatinine Equation (2021)    Anion gap 14 5 - 15    Comment: Performed at [facility], [address], [place], [place] [address]  Brain natriuretic peptide  Status: None   Collection Time: 02/23/20  9:47 AM  Result Value Ref Range   B Natriuretic Peptide 54.0 0.0 - 100.0 pg/mL    Comment: Performed at [facility], [address] [place], [place] [address]  Troponin I (High Sensitivity)     Status: None   Collection Time: 02/23/20  9:47 AM  Result Value Ref Range   Troponin I (High Sensitivity) 2 <18 ng/L    Comment: (NOTE) Elevated high sensitivity troponin I (hsTnI) values and significant  changes across serial measurements may suggest ACS but many other  chronic and acute conditions are known to elevate hsTnI results.  Refer to the "Links" section for chest pain algorithms and additional  guidance. Performed at [facility], [address] [place], [place] [address]   CBC with Differential     Status: Abnormal   Collection Time: 02/23/20  9:47 AM  Result Value Ref Range   WBC 16.2 (H) 4.0 - 10.5 K/uL   RBC 3.41 (L) 3.87 - 5.11 MIL/uL   Hemoglobin 12.9 12.0 - 15.0 g/dL   HCT 09.8 36 - 46 %   MCV 106.2 (H) 80.0 - 100.0 fL   MCH 37.8 (H) 26.0 - 34.0 pg   MCHC 35.6 30.0 - 36.0 g/dL   RDW 11.9 14.7 - 82.9 %   Platelets 205 150 - 400 K/uL   nRBC 0.0 0.0 - 0.2 %   Neutrophils Relative % 86 %   Neutro Abs 14.0 (H) 1.7 - 7.7 K/uL   Lymphocytes Relative 7 %   Lymphs Abs 1.1 0.7 - 4.0 K/uL   Monocytes Relative 5 %   Monocytes Absolute 0.8 0.1 - 1.0 K/uL   Eosinophils Relative 2 %   Eosinophils Absolute 0.3 0.0 - 0.5 K/uL   Basophils Relative 0 %   Basophils Absolute 0.1 0.0 - 0.1 K/uL   Immature Granulocytes 0 %   Abs Immature Granulocytes 0.05 0.00 - 0.07 K/uL    Comment: Performed at [facility], [address] [place], [place] [address]  Protime-INR     Status: None   Collection Time: 02/23/20  9:47 AM  Result Value Ref Range   Prothrombin Time 14.0 11.4 - 15.2 seconds   INR 1.1 0.8 - 1.2    Comment: (NOTE) INR goal varies based on device and disease states. Performed at [facility], [address], [place], [place] [address]   hCG, quantitative, pregnancy     Status: None   Collection Time: 02/23/20  9:47 AM  Result Value Ref Range   hCG, Beta Chain, Quant, S <1 <5 mIU/mL    Comment:          GEST. AGE      CONC.  (mIU/mL)   <=1 WEEK        5 - 50     2 WEEKS       50 - 500     3 WEEKS       100 - 10,000     4 WEEKS     1,000 - 30,000     5 WEEKS     3,500 - 115,000   6-8 WEEKS     12,000 - 270,000    12 WEEKS     15,000 - 220,000        FEMALE AND NON-PREGNANT FEMALE:     LESS THAN 5 mIU/mL Performed at [facility], [address] [place], [place] [address]   Lactic acid, plasma  Status: Abnormal   Collection Time: 02/23/20  9:49 AM  Result Value Ref Range   Lactic Acid, Venous 3.3 (HH) 0.5 - 1.9 mmol/L    Comment: CRITICAL RESULT CALLED TO, READ BACK BY AND VERIFIED WITH: MCCARTNEY,S AT 10:15AM ON 02/23/20 BY [facility] Performed at [facility], [address] [place], [place] [address]   CBG monitoring, ED     Status: Abnormal   Collection Time: 02/23/20 10:15 AM  Result Value Ref Range   Glucose-Capillary 185 (H) 70 - 99 mg/dL    Comment: Glucose reference range applies only to samples taken after fasting for at least 8 hours.  Blood culture (routine x 2)     Status: None (Preliminary result)   Collection Time: 02/23/20 10:21 AM   Specimen: Hand; Blood  Result Value Ref Range   Specimen Description HAND RIGHT    Special Requests      BOTTLES DRAWN AEROBIC AND ANAEROBIC Blood Culture adequate volume   Culture      NO GROWTH < 24 HOURS Performed at [facility], [address] [place], [place] [address]    Report Status PENDING   Lactic acid, plasma      Status: Abnormal   Collection Time: 02/23/20 12:36 PM  Result Value Ref Range   Lactic Acid, Venous 2.0 (HH) 0.5 - 1.9 mmol/L    Comment: CRITICAL RESULT CALLED TO, READ BACK BY AND VERIFIED WITH: DOSS,M@1355  BY MATTHEWS, B 11.19.2021 Performed at [facility], [address] [place], [place] [address]   Troponin I (High Sensitivity)     Status: None   Collection Time: 02/23/20 12:36 PM  Result Value Ref Range   Troponin I (High Sensitivity) <2 <18 ng/L    Comment: (NOTE) Elevated high sensitivity troponin I (hsTnI) values and significant  changes across serial measurements may suggest ACS but many other  chronic and acute conditions are known to elevate hsTnI results.  Refer to the "Links" section for chest pain algorithms and additional  guidance. Performed at [facility], [address], [place], [place] [address]   Blood culture (routine x 2)     Status: None (Preliminary result)   Collection Time: 02/23/20 12:48 PM   Specimen: Arm; Blood  Result Value Ref Range   Specimen Description ARM RIGHT    Special Requests      BOTTLES DRAWN AEROBIC AND ANAEROBIC Blood Culture adequate volume   Culture      NO GROWTH < 24 HOURS Performed at [facility], [address], [place], [place] [address]    Report Status PENDING   Resp Panel by RT-PCR (Flu A&B, Covid) Nasopharyngeal Swab     Status: None   Collection Time: 02/23/20  2:35 PM   Specimen: Nasopharyngeal Swab; Nasopharyngeal(NP) swabs in vial transport medium  Result Value Ref Range   SARS Coronavirus 2 by RT PCR NEGATIVE NEGATIVE    Comment: (NOTE) SARS-CoV-2 target nucleic acids are NOT DETECTED.  The SARS-CoV-2 RNA is generally detectable in upper respiratory specimens during the acute phase of infection. The lowest concentration of SARS-CoV-2 viral copies this assay can detect is 138 copies/mL. A negative result does not preclude SARS-Cov-2 infection and should not be used as the sole basis for treatment or other  patient management decisions. A negative result may occur with  improper specimen collection/handling, submission of specimen other than nasopharyngeal swab, presence of viral mutation(s) within the areas targeted by this assay, and inadequate number of viral copies(<138 copies/mL). A negative result must be  combined with clinical observations, patient history, and epidemiological information. The expected result is Negative.  Fact Sheet for Patients:  BloggerCourse.com  Fact Sheet for Healthcare Providers:  SeriousBroker.it  This test is no t yet approved or cleared by the [place] FDA and  has been authorized for detection and/or diagnosis of SARS-CoV-2 by FDA under an Emergency Use Authorization (EUA). This EUA will remain  in effect (meaning this test can be used) for the duration of the COVID-19 declaration under Section 564(b)(1) of the Act, 21 U.S.C.section 360bbb-3(b)(1), unless the authorization is terminated  or revoked sooner.       Influenza A by PCR NEGATIVE NEGATIVE   Influenza B by PCR NEGATIVE NEGATIVE    Comment: (NOTE) The Xpert Xpress SARS-CoV-2/FLU/RSV plus assay is intended as an aid in the diagnosis of influenza from Nasopharyngeal swab specimens and should not be used as a sole basis for treatment. Nasal washings and aspirates are unacceptable for Xpert Xpress SARS-CoV-2/FLU/RSV testing.  Fact Sheet for Patients: BloggerCourse.com  Fact Sheet for Healthcare Providers: SeriousBroker.it  This test is not yet approved or cleared by the [place] FDA and has been authorized for detection and/or diagnosis of SARS-CoV-2 by FDA under an Emergency Use Authorization (EUA). This EUA will remain in effect (meaning this test can be used) for the duration of the COVID-19 declaration under Section 564(b)(1) of the Act, 21 U.S.C. section 360bbb-3(b)(1), unless  the authorization is terminated or revoked.  Performed at [facility], [address] [place], [place] [address]   Type and screen     Status: None   Collection Time: 02/23/20  2:45 PM  Result Value Ref Range   ABO/RH(D) A POS    Antibody Screen NEG    Sample Expiration 02/26/2020,2359    Unit Number [id number]    Blood Component Type RED CELLS,LR    Unit division 00    Status of Unit ISSUED,FINAL    Transfusion Status OK TO TRANSFUSE    Crossmatch Result COMPATIBLE    Unit Number [id number]    Blood Component Type RBC LR PHER2    Unit division 00    Status of Unit ISSUED,FINAL    Transfusion Status OK TO TRANSFUSE    Crossmatch Result COMPATIBLE   Type and screen [facility]     Status: None (Preliminary result)   Collection Time: 02/23/20  4:42 PM  Result Value Ref Range   ABO/RH(D) A POS    Antibody Screen NEG    Sample Expiration 02/26/2020,2359    Unit Number [id number]    Blood Component Type RED CELLS,LR    Unit division 00    Status of Unit ISSUED,FINAL    Transfusion Status OK TO TRANSFUSE    Crossmatch Result      Compatible Performed at [facility], [address], [place], [place] [address]    Unit Number [id number]    Blood Component Type RED CELLS,LR    Unit division 00    Status of Unit ALLOCATED    Transfusion Status OK TO TRANSFUSE    Crossmatch Result Compatible   Prepare fresh frozen plasma     Status: None   Collection Time: 02/23/20  4:42 PM  Result Value Ref Range   Unit Number [id number]    Blood Component Type THW PLS APHR    Unit division 00    Status of Unit ISSUED,FINAL    Transfusion Status OK TO TRANSFUSE    Unit Number [id number]  Blood Component Type THW PLS APHR    Unit division B0    Status of Unit ISSUED,FINAL    Transfusion Status      OK TO TRANSFUSE Performed at [facility], [address] [place], [place] [address]   HIV Antibody (routine testing w rflx)     Status:  None   Collection Time: 02/23/20  9:09 PM  Result Value Ref Range   HIV Screen 4th Generation wRfx Non Reactive Non Reactive    Comment: Performed at [facility], [address], [place], [place] [address]  CBC     Status: Abnormal   Collection Time: 02/23/20  9:09 PM  Result Value Ref Range   WBC 21.7 (H) 4.0 - 10.5 K/uL   RBC 3.77 (L) 3.87 - 5.11 MIL/uL   Hemoglobin 12.5 12.0 - 15.0 g/dL   HCT 63.8 36 - 46 %   MCV 97.3 80.0 - 100.0 fL   MCH 33.2 26.0 - 34.0 pg   MCHC 34.1 30.0 - 36.0 g/dL   RDW 46.6 (H) 59.9 - 35.7 %   Platelets 83 (L) 150 - 400 K/uL    Comment: REPEATED TO VERIFY PLATELET COUNT CONFIRMED BY SMEAR Immature Platelet Fraction may be clinically indicated, consider ordering this additional test [id number]    nRBC 0.1 0.0 - 0.2 %    Comment: Performed at [facility], [address] [place], [place] [address]  Basic metabolic panel     Status: Abnormal   Collection Time: 02/23/20  9:09 PM  Result Value Ref Range   Sodium 138 135 - 145 mmol/L   Potassium 4.2 3.5 - 5.1 mmol/L   Chloride 107 98 - 111 mmol/L   CO2 18 (L) 22 - 32 mmol/L   Glucose, Bld 174 (H) 70 - 99 mg/dL    Comment: Glucose reference range applies only to samples taken after fasting for at least 8 hours.   BUN 6 6 - 20 mg/dL   Creatinine, Ser 3.30 0.44 - 1.00 mg/dL   Calcium 7.1 (L) 8.9 - 10.3 mg/dL   GFR, Estimated >07 >62 mL/min    Comment: (NOTE) Calculated using the CKD-EPI Creatinine Equation (2021)    Anion gap 13 5 - 15    Comment: Performed at [facility], [address] [place], [place] [address]  Protime-INR     Status: None   Collection Time: 02/23/20  9:09 PM  Result Value Ref Range   Prothrombin Time 14.7 11.4 - 15.2 seconds   INR 1.2 0.8 - 1.2    Comment: (NOTE) INR goal varies based on device and disease states. Performed at [facility], [address] [place], [place] [address]   APTT     Status: None   Collection Time: 02/23/20  9:09 PM   Result Value Ref Range   aPTT 25 24 - 36 seconds    Comment: Performed at [facility], [address] [place], [place] [address]  MRSA PCR Screening     Status: None   Collection Time: 02/23/20 10:22 PM   Specimen: Nasal Mucosa; Nasopharyngeal  Result Value Ref Range   MRSA by PCR NEGATIVE NEGATIVE    Comment:        The GeneXpert MRSA Assay (FDA approved for NASAL specimens only), is one component of a comprehensive MRSA colonization surveillance program. It is not intended to diagnose MRSA infection nor to guide or monitor treatment for MRSA infections.  Performed at [facility], [address], [place], [place] [address]   CBC     Status: Abnormal   Collection Time: 02/24/20  4:33 AM  Result Value Ref Range   WBC 21.6 (H) 4.0 - 10.5 K/uL   RBC 3.51 (L) 3.87 - 5.11 MIL/uL   Hemoglobin 11.5 (L) 12.0 - 15.0 g/dL   HCT 74.2 (L) 36 - 46 %   MCV 95.7 80.0 - 100.0 fL   MCH 32.8 26.0 - 34.0 pg   MCHC 34.2 30.0 - 36.0 g/dL   RDW 59.5 (H) 63.8 - 75.6 %   Platelets 99 (L) 150 - 400 K/uL    Comment: REPEATED TO VERIFY Immature Platelet Fraction may be clinically indicated, consider ordering this additional test [id number] CONSISTENT WITH PREVIOUS RESULT    nRBC 0.0 0.0 - 0.2 %    Comment: Performed at [facility], [address], [place], [place] [address]  Basic metabolic panel     Status: Abnormal   Collection Time: 02/24/20  4:33 AM  Result Value Ref Range   Sodium 138 135 - 145 mmol/L   Potassium 4.4 3.5 - 5.1 mmol/L   Chloride 107 98 - 111 mmol/L   CO2 21 (L) 22 - 32 mmol/L   Glucose, Bld 158 (H) 70 - 99 mg/dL    Comment: Glucose reference range applies only to samples taken after fasting for at least 8 hours.   BUN 8 6 - 20 mg/dL   Creatinine, Ser 0.63 0.44 - 1.00 mg/dL   Calcium 7.3 (L) 8.9 - 10.3 mg/dL   GFR, Estimated >01 >60 mL/min    Comment: (NOTE) Calculated using the CKD-EPI Creatinine Equation (2021)    Anion gap 10 5 - 15     Comment: Performed at [facility], [address] [place], [place] [address]  Protime-INR     Status: None   Collection Time: 02/24/20  4:33 AM  Result Value Ref Range   Prothrombin Time 14.3 11.4 - 15.2 seconds   INR 1.2 0.8 - 1.2    Comment: (NOTE) INR goal varies based on device and disease states. Performed at [facility], [address], [place], [place] [address]   T4, free     Status: None   Collection Time: 02/24/20  8:40 AM  Result Value Ref Range   Free T4 1.03 0.61 - 1.12 ng/dL    Comment: (NOTE) Biotin ingestion may interfere with free T4 tests. If the results are inconsistent with the TSH level, previous test results, or the clinical presentation, then consider biotin interference. If needed, order repeat testing after stopping biotin. Performed at [facility], [address], [place], [place] [address]   TSH     Status: None   Collection Time: 02/24/20  8:40 AM  Result Value Ref Range   TSH 2.958 0.350 - 4.500 uIU/mL    Comment: Performed by a 3rd Generation assay with a functional sensitivity of <=0.01 uIU/mL. Performed at [facility], [address], [place], [place] [address]     DG Forearm Left  Result Date: 02/23/2020 CLINICAL DATA:  Fall yesterday due to syncope EXAM: LEFT FOREARM - 2 VIEW COMPARISON:  None. FINDINGS: Distal radius fracture with ventral sided impaction and fragmentation. Intact and located proximal forearm. Negative for elbow joint effusion. Located radiocarpal joint. IMPRESSION: Impacted distal radius fracture. Electronically Signed   By: Marnee Spring M.D.   On: 02/23/2020 09:24  CT Angio Chest PE W and/or Wo Contrast  Result Date: 02/23/2020 CLINICAL DATA:  Larey Seat last evening. Left-sided chest pain. Syncopal episode. EXAM: CT ANGIOGRAPHY CHEST CT ABDOMEN AND PELVIS WITH CONTRAST TECHNIQUE: Multidetector CT imaging of the chest was performed using the standard protocol during bolus administration of  intravenous contrast. Multiplanar CT image reconstructions and MIPs were obtained to evaluate the vascular anatomy. Multidetector CT imaging of the abdomen and pelvis was performed using the standard protocol during bolus administration of intravenous contrast. CONTRAST:  OMNIPAQUE IOHEXOL 350 MG/ML SOLN COMPARISON:  None. FINDINGS: CTA CHEST FINDINGS Cardiovascular: The heart is normal in size. No pericardial effusion. The aorta is normal in caliber. No dissection. No atherosclerotic calcifications. The branch vessels are patent. No coronary artery calcifications. The pulmonary arteries appear normal. No filling defects to suggest pulmonary embolism. Mediastinum/Nodes: No mediastinal or hilar mass or adenopathy or hematoma. The esophagus is grossly normal. Lungs/Pleura: No acute pulmonary findings. No pulmonary contusion or pneumothorax. No pleural effusion. Musculoskeletal: Musculoskeletal no rib, sternal or thoracic vertebral body fractures are identified. There is a remote healed left lateral rib fracture noted. Review of the MIP images confirms the above findings. CT ABDOMEN and PELVIS FINDINGS Hepatobiliary: No acute hepatic injury is identified. No hepatic lesions. There is diffuse fatty infiltration of the liver noted. The portal and hepatic veins are patent. The gallbladder is unremarkable. No common bile duct dilatation. Pancreas: No mass, inflammation or ductal dilatation. No acute injury. Spleen: Significant splenic injury is demonstrated. Multiple large lacerations extending to the splenic hilum with areas of active bleeding/extravasation suspected. There is a large subcapsular hematoma along with moderate abdominal/pelvic hemoperitoneum. Findings consistent with a grade 4 splenic injury. Adrenals/Urinary Tract: The adrenal glands and kidneys are unremarkable. No acute renal injury or perinephric fluid collection. Stomach/Bowel: The stomach, duodenum, small bowel and colon are unremarkable. No  acute inflammatory changes, mass lesions or obstructive findings. Vascular/Lymphatic: The aorta and branch vessels are patent. The major venous structures are patent. No mesenteric or retroperitoneal mass or adenopathy. Reproductive: Reproductive the uterus and ovaries are unremarkable. Tubal ligation clips are noted bilaterally. Other: Moderate volume abdominal/pelvic hemoperitoneum Musculoskeletal: The bony pelvis is intact. No pelvic fractures. The pubic symphysis and SI joints are maintained. Both hips are normally located. The lumbar vertebral bodies are normally aligned. No fractures are identified. Review of the MIP images confirms the above findings. IMPRESSION: 1. Grade 4 splenic injury with multiple large lacerations extending to the splenic hilum, small vascular injuries with contrast extravasation and moderate volume abdominal/pelvic hemoperitoneum. 2. No CT findings for pulmonary embolism. 3. No acute pulmonary findings or pneumothorax. 4. No acute bony findings. 5. Diffuse fatty infiltration of the liver. These results were called by telephone at the time of interpretation on 02/23/2020 at 2:18 pm to provider Geoffery Lyons , who verbally acknowledged these results. Electronically Signed   By: Rudie Meyer M.D.   On: 02/23/2020 14:18   CT ABDOMEN PELVIS W CONTRAST  Result Date: 02/23/2020 CLINICAL DATA:  Larey Seat last evening. Left-sided chest pain. Syncopal episode. EXAM: CT ANGIOGRAPHY CHEST CT ABDOMEN AND PELVIS WITH CONTRAST TECHNIQUE: Multidetector CT imaging of the chest was performed using the standard protocol during bolus administration of intravenous contrast. Multiplanar CT image reconstructions and MIPs were obtained to evaluate the vascular anatomy. Multidetector CT imaging of the abdomen and pelvis was performed using the standard protocol during bolus administration of intravenous contrast. CONTRAST:  OMNIPAQUE IOHEXOL 350 MG/ML SOLN COMPARISON:  None. FINDINGS: CTA CHEST  FINDINGS  Cardiovascular: The heart is normal in size. No pericardial effusion. The aorta is normal in caliber. No dissection. No atherosclerotic calcifications. The branch vessels are patent. No coronary artery calcifications. The pulmonary arteries appear normal. No filling defects to suggest pulmonary embolism. Mediastinum/Nodes: No mediastinal or hilar mass or adenopathy or hematoma. The esophagus is grossly normal. Lungs/Pleura: No acute pulmonary findings. No pulmonary contusion or pneumothorax. No pleural effusion. Musculoskeletal: Musculoskeletal no rib, sternal or thoracic vertebral body fractures are identified. There is a remote healed left lateral rib fracture noted. Review of the MIP images confirms the above findings. CT ABDOMEN and PELVIS FINDINGS Hepatobiliary: No acute hepatic injury is identified. No hepatic lesions. There is diffuse fatty infiltration of the liver noted. The portal and hepatic veins are patent. The gallbladder is unremarkable. No common bile duct dilatation. Pancreas: No mass, inflammation or ductal dilatation. No acute injury. Spleen: Significant splenic injury is demonstrated. Multiple large lacerations extending to the splenic hilum with areas of active bleeding/extravasation suspected. There is a large subcapsular hematoma along with moderate abdominal/pelvic hemoperitoneum. Findings consistent with a grade 4 splenic injury. Adrenals/Urinary Tract: The adrenal glands and kidneys are unremarkable. No acute renal injury or perinephric fluid collection. Stomach/Bowel: The stomach, duodenum, small bowel and colon are unremarkable. No acute inflammatory changes, mass lesions or obstructive findings. Vascular/Lymphatic: The aorta and branch vessels are patent. The major venous structures are patent. No mesenteric or retroperitoneal mass or adenopathy. Reproductive: Reproductive the uterus and ovaries are unremarkable. Tubal ligation clips are noted bilaterally. Other: Moderate volume  abdominal/pelvic hemoperitoneum Musculoskeletal: The bony pelvis is intact. No pelvic fractures. The pubic symphysis and SI joints are maintained. Both hips are normally located. The lumbar vertebral bodies are normally aligned. No fractures are identified. Review of the MIP images confirms the above findings. IMPRESSION: 1. Grade 4 splenic injury with multiple large lacerations extending to the splenic hilum, small vascular injuries with contrast extravasation and moderate volume abdominal/pelvic hemoperitoneum. 2. No CT findings for pulmonary embolism. 3. No acute pulmonary findings or pneumothorax. 4. No acute bony findings. 5. Diffuse fatty infiltration of the liver. These results were called by telephone at the time of interpretation on 02/23/2020 at 2:18 pm to provider Geoffery Lyons , who verbally acknowledged these results. Electronically Signed   By: Rudie Meyer M.D.   On: 02/23/2020 14:18     Constitutional: positive for night sweats Cardiovascular: positive for chest pain  Blood pressure 115/76, pulse (!) 103, temperature 98.8 F (37.1 C), temperature source Oral, resp. rate 18, height  (1.6 m), weight 73.9 kg, SpO2 100 %, currently breastfeeding.  General appearance: alert, cooperative and appears stated age Head: Normocephalic, without obvious abnormality, atraumatic Neck: supple, symmetrical, trachea midline Extremities: Intact sensation and capillary refill all digits.  +epl/fpl/io.  No wounds. Bruising left wrist.  Tender to palpation left wrist.  Non tender elbow.  Compartments soft.  No wounds.  Non tender right arm. Pulses: 2+ and symmetric Skin: Skin color, texture, turgor normal. No rashes or lesions Neurologic: Grossly normal Incision/Wound: none  Assessment/Plan Left distal radius fracture.  Recommend left wrist splint and follow up in office.  Discussed possible surgical fixation on outpatient basis in next couple of weeks.  Follow up after discharge.  Betha Loa 02/24/2020, 11:36 PM

## 2020-02-25 ENCOUNTER — Encounter (HOSPITAL_COMMUNITY): Payer: Self-pay | Admitting: Surgery

## 2020-02-25 MED ORDER — HYDROMORPHONE HCL 1 MG/ML IJ SOLN
1.0000 mg | INTRAMUSCULAR | Status: DC | PRN
  Administered 2020-02-26 – 2020-02-29 (×9): 1 mg via INTRAVENOUS
  Filled 2020-02-25 (×10): qty 1

## 2020-02-25 MED ORDER — DIAZEPAM 5 MG PO TABS
5.0000 mg | ORAL_TABLET | Freq: Four times a day (QID) | ORAL | Status: DC | PRN
  Administered 2020-02-25: 5 mg via ORAL
  Filled 2020-02-25: qty 1

## 2020-02-25 NOTE — Progress Notes (Signed)
2 Days Post-Op   Subjective/Chief Complaint: Pt with expected soreness and pain   Objective: Vital signs in last 24 hours: Temp:  [98 F (36.7 C)-99.1 F (37.3 C)] 98 F (36.7 C) (11/21 0515) Pulse Rate:  [80-103] 88 (11/21 0515) Resp:  [12-37] 15 (11/21 0515) BP: (97-140)/(57-83) 98/57 (11/21 0515) SpO2:  [95 %-100 %] 100 % (11/21 0515) Last BM Date:  (pt unsure)  Intake/Output from previous day: 11/20 0701 - 11/21 0700 In: 2110.5 [P.O.:120; I.V.:1930.5] Out: 240 [Urine:80; Drains:160] Intake/Output this shift: No intake/output data recorded.  General appearance: alert and cooperative GI: soft, non-tender; bowel sounds normal; no masses,  no organomegaly and inc c/d/i, Jp SS  Lab Results:  Recent Labs    02/23/20 2109 02/24/20 0433  WBC 21.7* 21.6*  HGB 12.5 11.5*  HCT 36.7 33.6*  PLT 83* 99*   BMET Recent Labs    02/23/20 2109 02/24/20 0433  NA 138 138  K 4.2 4.4  CL 107 107  CO2 18* 21*  GLUCOSE 174* 158*  BUN 6 8  CREATININE 0.99 0.96  CALCIUM 7.1* 7.3*   PT/INR Recent Labs    02/23/20 2109 02/24/20 0433  LABPROT 14.7 14.3  INR 1.2 1.2   ABG No results for input(s): PHART, HCO3 in the last 72 hours.  Invalid input(s): PCO2, PO2  Studies/Results: CT Angio Chest PE W and/or Wo Contrast  Result Date: 02/23/2020 CLINICAL DATA:  Larey Seat last evening. Left-sided chest pain. Syncopal episode. EXAM: CT ANGIOGRAPHY CHEST CT ABDOMEN AND PELVIS WITH CONTRAST TECHNIQUE: Multidetector CT imaging of the chest was performed using the standard protocol during bolus administration of intravenous contrast. Multiplanar CT image reconstructions and MIPs were obtained to evaluate the vascular anatomy. Multidetector CT imaging of the abdomen and pelvis was performed using the standard protocol during bolus administration of intravenous contrast. CONTRAST:  OMNIPAQUE IOHEXOL 350 MG/ML SOLN COMPARISON:  None. FINDINGS: CTA CHEST FINDINGS Cardiovascular: The heart  is normal in size. No pericardial effusion. The aorta is normal in caliber. No dissection. No atherosclerotic calcifications. The branch vessels are patent. No coronary artery calcifications. The pulmonary arteries appear normal. No filling defects to suggest pulmonary embolism. Mediastinum/Nodes: No mediastinal or hilar mass or adenopathy or hematoma. The esophagus is grossly normal. Lungs/Pleura: No acute pulmonary findings. No pulmonary contusion or pneumothorax. No pleural effusion. Musculoskeletal: Musculoskeletal no rib, sternal or thoracic vertebral body fractures are identified. There is a remote healed left lateral rib fracture noted. Review of the MIP images confirms the above findings. CT ABDOMEN and PELVIS FINDINGS Hepatobiliary: No acute hepatic injury is identified. No hepatic lesions. There is diffuse fatty infiltration of the liver noted. The portal and hepatic veins are patent. The gallbladder is unremarkable. No common bile duct dilatation. Pancreas: No mass, inflammation or ductal dilatation. No acute injury. Spleen: Significant splenic injury is demonstrated. Multiple large lacerations extending to the splenic hilum with areas of active bleeding/extravasation suspected. There is a large subcapsular hematoma along with moderate abdominal/pelvic hemoperitoneum. Findings consistent with a grade 4 splenic injury. Adrenals/Urinary Tract: The adrenal glands and kidneys are unremarkable. No acute renal injury or perinephric fluid collection. Stomach/Bowel: The stomach, duodenum, small bowel and colon are unremarkable. No acute inflammatory changes, mass lesions or obstructive findings. Vascular/Lymphatic: The aorta and branch vessels are patent. The major venous structures are patent. No mesenteric or retroperitoneal mass or adenopathy. Reproductive: Reproductive the uterus and ovaries are unremarkable. Tubal ligation clips are noted bilaterally. Other: Moderate volume abdominal/pelvic hemoperitoneum  Musculoskeletal:  The bony pelvis is intact. No pelvic fractures. The pubic symphysis and SI joints are maintained. Both hips are normally located. The lumbar vertebral bodies are normally aligned. No fractures are identified. Review of the MIP images confirms the above findings. IMPRESSION: 1. Grade 4 splenic injury with multiple large lacerations extending to the splenic hilum, small vascular injuries with contrast extravasation and moderate volume abdominal/pelvic hemoperitoneum. 2. No CT findings for pulmonary embolism. 3. No acute pulmonary findings or pneumothorax. 4. No acute bony findings. 5. Diffuse fatty infiltration of the liver. These results were called by telephone at the time of interpretation on 02/23/2020 at 2:18 pm to provider Geoffery Lyons , who verbally acknowledged these results. Electronically Signed   By: Rudie Meyer M.D.   On: 02/23/2020 14:18   CT ABDOMEN PELVIS W CONTRAST  Result Date: 02/23/2020 CLINICAL DATA:  Larey Seat last evening. Left-sided chest pain. Syncopal episode. EXAM: CT ANGIOGRAPHY CHEST CT ABDOMEN AND PELVIS WITH CONTRAST TECHNIQUE: Multidetector CT imaging of the chest was performed using the standard protocol during bolus administration of intravenous contrast. Multiplanar CT image reconstructions and MIPs were obtained to evaluate the vascular anatomy. Multidetector CT imaging of the abdomen and pelvis was performed using the standard protocol during bolus administration of intravenous contrast. CONTRAST:  OMNIPAQUE IOHEXOL 350 MG/ML SOLN COMPARISON:  None. FINDINGS: CTA CHEST FINDINGS Cardiovascular: The heart is normal in size. No pericardial effusion. The aorta is normal in caliber. No dissection. No atherosclerotic calcifications. The branch vessels are patent. No coronary artery calcifications. The pulmonary arteries appear normal. No filling defects to suggest pulmonary embolism. Mediastinum/Nodes: No mediastinal or hilar mass or adenopathy or hematoma. The  esophagus is grossly normal. Lungs/Pleura: No acute pulmonary findings. No pulmonary contusion or pneumothorax. No pleural effusion. Musculoskeletal: Musculoskeletal no rib, sternal or thoracic vertebral body fractures are identified. There is a remote healed left lateral rib fracture noted. Review of the MIP images confirms the above findings. CT ABDOMEN and PELVIS FINDINGS Hepatobiliary: No acute hepatic injury is identified. No hepatic lesions. There is diffuse fatty infiltration of the liver noted. The portal and hepatic veins are patent. The gallbladder is unremarkable. No common bile duct dilatation. Pancreas: No mass, inflammation or ductal dilatation. No acute injury. Spleen: Significant splenic injury is demonstrated. Multiple large lacerations extending to the splenic hilum with areas of active bleeding/extravasation suspected. There is a large subcapsular hematoma along with moderate abdominal/pelvic hemoperitoneum. Findings consistent with a grade 4 splenic injury. Adrenals/Urinary Tract: The adrenal glands and kidneys are unremarkable. No acute renal injury or perinephric fluid collection. Stomach/Bowel: The stomach, duodenum, small bowel and colon are unremarkable. No acute inflammatory changes, mass lesions or obstructive findings. Vascular/Lymphatic: The aorta and branch vessels are patent. The major venous structures are patent. No mesenteric or retroperitoneal mass or adenopathy. Reproductive: Reproductive the uterus and ovaries are unremarkable. Tubal ligation clips are noted bilaterally. Other: Moderate volume abdominal/pelvic hemoperitoneum Musculoskeletal: The bony pelvis is intact. No pelvic fractures. The pubic symphysis and SI joints are maintained. Both hips are normally located. The lumbar vertebral bodies are normally aligned. No fractures are identified. Review of the MIP images confirms the above findings. IMPRESSION: 1. Grade 4 splenic injury with multiple large lacerations extending  to the splenic hilum, small vascular injuries with contrast extravasation and moderate volume abdominal/pelvic hemoperitoneum. 2. No CT findings for pulmonary embolism. 3. No acute pulmonary findings or pneumothorax. 4. No acute bony findings. 5. Diffuse fatty infiltration of the liver. These results were called by telephone  at the time of interpretation on 02/23/2020 at 2:18 pm to provider Geoffery Lyons , who verbally acknowledged these results. Electronically Signed   By: Rudie Meyer M.D.   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 AROBF Left impacted distal radius fracture - Hand c/s, Dr. Merlyn Lot, notified 11/19, confirmed notification 11/20 at 0745, continue splint, may f/u as o/p if ready for discharge prior to being seen in the hospital Hx SVT- metoprolol 12.5 BID started today Hx Hypothyroidism- reports this is untreated, check TFTs Hx ofprior alcohol Abuse- MVI/B1/folate FEN -clears, IVF VTE -SCDs, LMWH to start this PM ID -splenectomy vaccines at discharge or 14d post-op, whichever comes first Foley- remove Dispo - TTF, PT/OT/OOB     LOS: 2 days    Axel Filler 02/25/2020

## 2020-02-25 NOTE — Progress Notes (Signed)
OT Cancellation Note  Patient Details Name: Kara Stewart MRN: [id number] DOB: Mar 07, 1976   Cancelled Treatment:    Reason Eval/Treat Not Completed: Patient declined, no reason specified (Pt stating "I have just gotten out of bed. I'm not moving.")  OTR elevating LUE and initiating exercises, but pt wanting to rest.  OT to follow for OT evaluation.  Flora Lipps, OTR/L Acute Rehabilitation Services Pager: [phone] Office: [phone]   Flora Lipps 02/25/2020, 2:20 PM

## 2020-02-25 NOTE — Plan of Care (Signed)
Patient ambulating to bathroom. Patient continues to have abdominal pain.

## 2020-02-25 NOTE — Plan of Care (Signed)
Problem: Education: Goal: Knowledge of General Education information will improve Description: Including pain rating scale, medication(s)/side effects and non-pharmacologic comfort measures 02/25/2020 1725 by Sherrill Raring, RN Outcome: Progressing 02/25/2020 1725 by Sherrill Raring, RN Outcome: Progressing   Problem: Health Behavior/Discharge Planning: Goal: Ability to manage health-related needs will improve 02/25/2020 1725 by Sherrill Raring, RN Outcome: Progressing 02/25/2020 1725 by Sherrill Raring, RN Outcome: Progressing   Problem: Clinical Measurements: Goal: Ability to maintain clinical measurements within normal limits will improve 02/25/2020 1725 by Sherrill Raring, RN Outcome: Progressing 02/25/2020 1725 by Sherrill Raring, RN Outcome: Progressing Goal: Will remain free from infection 02/25/2020 1725 by Sherrill Raring, RN Outcome: Progressing 02/25/2020 1725 by Sherrill Raring, RN Outcome: Progressing Goal: Diagnostic test results will improve 02/25/2020 1725 by Sherrill Raring, RN Outcome: Progressing 02/25/2020 1725 by Sherrill Raring, RN Outcome: Progressing Goal: Respiratory complications will improve 02/25/2020 1725 by Sherrill Raring, RN Outcome: Progressing 02/25/2020 1725 by Sherrill Raring, RN Outcome: Progressing Goal: Cardiovascular complication will be avoided 02/25/2020 1725 by Sherrill Raring, RN Outcome: Progressing 02/25/2020 1725 by Sherrill Raring, RN Outcome: Progressing   Problem: Activity: Goal: Risk for activity intolerance will decrease 02/25/2020 1725 by Sherrill Raring, RN Outcome: Progressing 02/25/2020 1725 by Sherrill Raring, RN Outcome: Progressing   Problem: Nutrition: Goal: Adequate nutrition will be maintained 02/25/2020 1725 by Sherrill Raring, RN Outcome: Progressing 02/25/2020 1725 by Sherrill Raring, RN Outcome: Progressing   Problem: Coping: Goal: Level of anxiety will decrease 02/25/2020  1725 by Sherrill Raring, RN Outcome: Progressing 02/25/2020 1725 by Sherrill Raring, RN Outcome: Progressing   Problem: Elimination: Goal: Will not experience complications related to bowel motility 02/25/2020 1725 by Sherrill Raring, RN Outcome: Progressing 02/25/2020 1725 by Sherrill Raring, RN Outcome: Progressing Goal: Will not experience complications related to urinary retention 02/25/2020 1725 by Sherrill Raring, RN Outcome: Progressing 02/25/2020 1725 by Sherrill Raring, RN Outcome: Progressing   Problem: Pain Managment: Goal: General experience of comfort will improve 02/25/2020 1725 by Sherrill Raring, RN Outcome: Progressing 02/25/2020 1725 by Sherrill Raring, RN Outcome: Progressing   Problem: Safety: Goal: Ability to remain free from injury will improve 02/25/2020 1725 by Sherrill Raring, RN Outcome: Progressing 02/25/2020 1725 by Sherrill Raring, RN Outcome: Progressing   Problem: Skin Integrity: Goal: Risk for impaired skin integrity will decrease 02/25/2020 1725 by Sherrill Raring, RN Outcome: Progressing 02/25/2020 1725 by Sherrill Raring, RN Outcome: Progressing   Problem: Activity: Goal: Ability to perform activities at highest level will improve 02/25/2020 1725 by Sherrill Raring, RN Outcome: Progressing 02/25/2020 1725 by Sherrill Raring, RN Outcome: Progressing Goal: Ability to avoid complications of mobility impairment will improve 02/25/2020 1725 by Sherrill Raring, RN Outcome: Progressing 02/25/2020 1725 by Sherrill Raring, RN Outcome: Progressing Goal: Ability to tolerate increased activity will improve 02/25/2020 1725 by Sherrill Raring, RN Outcome: Progressing 02/25/2020 1725 by Sherrill Raring, RN Outcome: Progressing Goal: Will remain free from falls 02/25/2020 1725 by Sherrill Raring, RN Outcome: Progressing 02/25/2020 1725 by Sherrill Raring, RN Outcome: Progressing   Problem: Respiratory: Goal: Ability  to maintain adequate ventilation will improve 02/25/2020 1725 by Sherrill Raring, RN Outcome: Progressing 02/25/2020 1725 by Sherrill Raring, RN Outcome: Progressing   Problem: Tissue Perfusion: Goal: Hemodynamically stable with effective tissue perfusion will improve 02/25/2020 1725 by Sherrill Raring, RN Outcome: Progressing  02/25/2020 1725 by Sherrill Raring, RN Outcome: Progressing Goal: Postoperative complications will be avoided or minimized 02/25/2020 1725 by Sherrill Raring, RN Outcome: Progressing 02/25/2020 1725 by Sherrill Raring, RN Outcome: Progressing   Problem: Skin Integrity: Goal: Ability to maintain adequate tissue integrity will improve 02/25/2020 1725 by Sherrill Raring, RN Outcome: Progressing 02/25/2020 1725 by Sherrill Raring, RN Outcome: Progressing   Problem: Infection: Goal: Risk for infection will decrease (spleen) 02/25/2020 1725 by Sherrill Raring, RN Outcome: Progressing 02/25/2020 1725 by Sherrill Raring, RN Outcome: Progressing   Problem: Bowel/Gastric: Goal: Gastrointestinal status for postoperative course will improve 02/25/2020 1725 by Sherrill Raring, RN Outcome: Progressing 02/25/2020 1725 by Sherrill Raring, RN Outcome: Progressing Goal: GI tract motility and GI tissue perfusion will improve 02/25/2020 1725 by Sherrill Raring, RN Outcome: Progressing 02/25/2020 1725 by Sherrill Raring, RN Outcome: Progressing Goal: Ability to demonstrate the techniques of an individualized bowel program will improve 02/25/2020 1725 by Sherrill Raring, RN Outcome: Progressing 02/25/2020 1725 by Sherrill Raring, RN Outcome: Progressing   Problem: Urinary Elimination: Goal: Ability to achieve a regular elimination pattern will improve 02/25/2020 1725 by Sherrill Raring, RN Outcome: Progressing 02/25/2020 1725 by Sherrill Raring, RN Outcome: Progressing   Problem: Fluid Volume: Goal: Return to normovolemic state will  improve 02/25/2020 1725 by Sherrill Raring, RN Outcome: Progressing 02/25/2020 1725 by Sherrill Raring, RN Outcome: Progressing   Problem: Metabolic: Goal: Ability to maintain a metabolic balance will improve 50/35/4656 1725 by Sherrill Raring, RN Outcome: Progressing 02/25/2020 1725 by Sherrill Raring, RN Outcome: Progressing   Problem: Education: Goal: Knowledge of the prescribed therapeutic regimen will improve 02/25/2020 1725 by Sherrill Raring, RN Outcome: Progressing 02/25/2020 1725 by Sherrill Raring, RN Outcome: Progressing Goal: Knowledge of injury and care (of blunt abdominal trauma) will improve 02/25/2020 1725 by Sherrill Raring, RN Outcome: Progressing 02/25/2020 1725 by Sherrill Raring, RN Outcome: Progressing   Problem: Coping: Goal: Exhibits appropriate coping mechanism and reduced anxiety resulting from physical and emotional stress 02/25/2020 1725 by Sherrill Raring, RN Outcome: Progressing 02/25/2020 1725 by Sherrill Raring, RN Outcome: Progressing   Problem: Self-Concept: Goal: Ability to acknowledge body changes will improve 02/25/2020 1725 by Sherrill Raring, RN Outcome: Progressing 02/25/2020 1725 by Sherrill Raring, RN Outcome: Progressing

## 2020-02-25 NOTE — Progress Notes (Signed)
Orthopedic Tech Progress Note Patient Details:  Kara Stewart 04/21/1975 [id number]  Ortho Devices Type of Ortho Device: Velcro wrist splint Ortho Device/Splint Location: lue Ortho Device/Splint Interventions: Ordered, Application, Adjustment   Post Interventions Patient Tolerated: Well Instructions Provided: Care of device, Adjustment of device   [name] 02/25/2020, 1:28 AM

## 2020-02-26 ENCOUNTER — Inpatient Hospital Stay (HOSPITAL_COMMUNITY): Payer: Medicaid Other

## 2020-02-26 ENCOUNTER — Other Ambulatory Visit: Payer: Self-pay | Admitting: Physician Assistant

## 2020-02-26 LAB — CBC
HCT: 24.7 % — ABNORMAL LOW (ref 36.0–46.0)
HCT: 24 % — ABNORMAL LOW (ref 36.0–46.0)
Hemoglobin: 8.3 g/dL — ABNORMAL LOW (ref 12.0–15.0)
Hemoglobin: 8.2 g/dL — ABNORMAL LOW (ref 12.0–15.0)
MCH: 33.7 pg (ref 26.0–34.0)
MCH: 33.7 pg (ref 26.0–34.0)
MCHC: 33.6 g/dL (ref 30.0–36.0)
MCHC: 34.2 g/dL (ref 30.0–36.0)
MCV: 100.4 fL — ABNORMAL HIGH (ref 80.0–100.0)
MCV: 98.8 fL (ref 80.0–100.0)
Platelets: 191 10*3/uL (ref 150–400)
Platelets: 206 10*3/uL (ref 150–400)
RBC: 2.46 MIL/uL — ABNORMAL LOW (ref 3.87–5.11)
RBC: 2.43 MIL/uL — ABNORMAL LOW (ref 3.87–5.11)
RDW: 18.3 % — ABNORMAL HIGH (ref 11.5–15.5)
RDW: 18.3 % — ABNORMAL HIGH (ref 11.5–15.5)
WBC: 16.6 10*3/uL — ABNORMAL HIGH (ref 4.0–10.5)
WBC: 15 10*3/uL — ABNORMAL HIGH (ref 4.0–10.5)
nRBC: 0.2 % (ref 0.0–0.2)
nRBC: 0.2 % (ref 0.0–0.2)

## 2020-02-26 LAB — TRAUMA TEG PANEL
CFF Max Amplitude: 31.7 mm (ref 15–32)
Citrated Kaolin (R): 3.7 min — ABNORMAL LOW (ref 4.6–9.1)
Citrated Rapid TEG (MA): 68.3 mm (ref 52–70)
Lysis at 30 Minutes: 0 % (ref 0.0–2.6)

## 2020-02-26 LAB — TYPE AND SCREEN
ABO/RH(D): A POS
Antibody Screen: NEGATIVE

## 2020-02-26 LAB — BASIC METABOLIC PANEL
Anion gap: 13 (ref 5–15)
BUN: 5 mg/dL — ABNORMAL LOW (ref 6–20)
CO2: 22 mmol/L (ref 22–32)
Calcium: 8.1 mg/dL — ABNORMAL LOW (ref 8.9–10.3)
Chloride: 102 mmol/L (ref 98–111)
Creatinine, Ser: 0.68 mg/dL (ref 0.44–1.00)
GFR, Estimated: >60 mL/min (ref >60)
Glucose, Bld: 78 mg/dL (ref 70–99)
Potassium: 3.4 mmol/L — ABNORMAL LOW (ref 3.5–5.1)
Sodium: 137 mmol/L (ref 135–145)

## 2020-02-26 LAB — MAGNESIUM: Magnesium: 2.1 mg/dL (ref 1.7–2.4)

## 2020-02-26 LAB — T3, FREE: T3, Free: 2.1 pg/mL (ref 2.0–4.4)

## 2020-02-26 LAB — GLUCOSE, CAPILLARY: Glucose-Capillary: 154 mg/dL — ABNORMAL HIGH (ref 70–99)

## 2020-02-26 MED ORDER — LIDOCAINE 5 % EX PTCH
1.0000 | MEDICATED_PATCH | CUTANEOUS | Status: DC
  Administered 2020-02-26 – 2020-03-03 (×7): 1 via TRANSDERMAL
  Filled 2020-02-26 (×7): qty 1

## 2020-02-26 MED ORDER — PROMETHAZINE HCL 25 MG/ML IJ SOLN
6.2500 mg | Freq: Four times a day (QID) | INTRAMUSCULAR | Status: DC | PRN
  Administered 2020-02-28 – 2020-02-29 (×2): 6.25 mg via INTRAVENOUS
  Filled 2020-02-26 (×2): qty 1

## 2020-02-26 MED ORDER — METHOCARBAMOL 500 MG PO TABS
1000.0000 mg | ORAL_TABLET | Freq: Four times a day (QID) | ORAL | Status: DC
  Administered 2020-02-26 – 2020-03-03 (×24): 1000 mg via ORAL
  Filled 2020-02-26 (×24): qty 2

## 2020-02-26 MED ORDER — PANTOPRAZOLE SODIUM 40 MG IV SOLR
40.0000 mg | Freq: Every day | INTRAVENOUS | Status: DC
Start: 2020-02-26 — End: 2020-02-26

## 2020-02-26 MED ORDER — FAMOTIDINE IN NACL 20-0.9 MG/50ML-% IV SOLN
20.0000 mg | Freq: Two times a day (BID) | INTRAVENOUS | Status: DC
  Administered 2020-02-26 – 2020-02-27 (×4): 20 mg via INTRAVENOUS
  Filled 2020-02-26 (×6): qty 50

## 2020-02-26 MED ORDER — SIMETHICONE 80 MG PO CHEW
80.0000 mg | CHEWABLE_TABLET | Freq: Four times a day (QID) | ORAL | Status: DC | PRN
  Administered 2020-02-27 – 2020-02-29 (×3): 80 mg via ORAL
  Filled 2020-02-26 (×3): qty 1

## 2020-02-26 MED ORDER — IOHEXOL 300 MG/ML  SOLN
100.0000 mL | Freq: Once | INTRAMUSCULAR | Status: AC | PRN
  Administered 2020-02-26: 100 mL via INTRAVENOUS

## 2020-02-26 MED ORDER — ACETAMINOPHEN 325 MG PO TABS
650.0000 mg | ORAL_TABLET | Freq: Four times a day (QID) | ORAL | Status: DC
  Administered 2020-02-26 – 2020-03-03 (×24): 650 mg via ORAL
  Filled 2020-02-26 (×25): qty 2

## 2020-02-26 MED ORDER — POTASSIUM CHLORIDE 10 MEQ/100ML IV SOLN
10.0000 meq | INTRAVENOUS | Status: AC
  Administered 2020-02-26 (×5): 10 meq via INTRAVENOUS
  Filled 2020-02-26 (×3): qty 100

## 2020-02-26 MED ORDER — KCL IN DEXTROSE-NACL 20-5-0.45 MEQ/L-%-% IV SOLN
INTRAVENOUS | Status: DC
  Filled 2020-02-26 (×4): qty 1000

## 2020-02-26 MED FILL — Sodium Chloride Irrigation Soln 0.9%: Qty: 3000 | Status: AC

## 2020-02-26 MED FILL — Heparin Sodium (Porcine) Inj 1000 Unit/ML: INTRAMUSCULAR | Qty: 30 | Status: AC

## 2020-02-26 MED FILL — Sodium Chloride IV Soln 0.9%: INTRAVENOUS | Qty: 1000 | Status: AC

## 2020-02-26 NOTE — Progress Notes (Signed)
Physical Therapy Treatment Patient Details Name: Kara Stewart MRN: [id number] DOB: October 01, 1975 Today's Date: 02/26/2020    History of Present Illness 44 y.o. female with a history of prior alcohol abuse w/ alcohol related hepatitis and hypothyroidism who was transferred from [facility] after a fall and found to have laceration and left impacted distal radius fracture. Pt found to have grade 5 spleen laceration, L impacted radius fx. 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    PT Comments    Continuing work on functional mobility and activity tolerance;  Initially, pt did not want to get up, but she did agree to trying the L platform RW, and then requested to get to the bathroom; Obtained Orthostatic BPs, which were negative for a drop; rEports the platform RW is helpful for walking; Educated pt in the need for gentle trunk extension for optimal healing   Follow Up Recommendations  Home health PT;Supervision - Intermittent     Equipment Recommendations  Other (comment) (Cane versus L platform RW)    Recommendations for Other Services       Precautions / Restrictions Precautions Precautions: Fall Required Braces or Orthoses: Splint/Cast Splint/Cast: L wrist splint Restrictions LUE Weight Bearing: Non weight bearing    Mobility  Bed Mobility Overal bed mobility: Needs Assistance Bed Mobility: Rolling;Sidelying to Sit;Sit to Supine Rolling: Min guard Sidelying to sit: Min guard   Sit to supine: Min guard   General bed mobility comments: Minguard for afety and line management  Transfers Overall transfer level: Needs assistance Equipment used: Left platform walker Transfers: Sit to/from Stand Sit to Stand: Min guard         General transfer comment: Minguard for safety and to demonstrate use of L platform  Ambulation/Gait Ambulation/Gait assistance: Min guard Gait Distance (Feet): 20 Feet (to /from bathroom) Assistive device: Left platform walker Gait Pattern/deviations:  Step-through pattern;Decreased step length - right;Decreased step length - left Gait velocity: reduced   General Gait Details: Cues to self-monitor for activity tolerance; Pt indicated the L platform RW was useful   Stairs             Wheelchair Mobility    Modified Rankin (Stroke Patients Only)       Balance     Sitting balance-Leahy Scale: Good       Standing balance-Leahy Scale: Fair                              Cognition Arousal/Alertness: Awake/alert Behavior During Therapy: WFL for tasks assessed/performed Overall Cognitive Status: Within Functional Limits for tasks assessed                                        Exercises      General Comments General comments (skin integrity, edema, etc.): Session conducted on Room Air, and O2 sats were 96% at end of session; no lightheadedness reported and orthostatics negative for a drop      Pertinent Vitals/Pain Pain Assessment: 0-10 Pain Score: 7  Pain Location: abdomen Pain Descriptors / Indicators: Sore Pain Intervention(s): Monitored during session    Home Living                      Prior Function            PT Goals (current goals can now be found in the care  plan section) Acute Rehab PT Goals Patient Stated Goal: To return to independent mobility PT Goal Formulation: With patient Time For Goal Achievement: 03/09/20 Potential to Achieve Goals: Good Progress towards PT goals: Progressing toward goals    Frequency    Min 5X/week      PT Plan Current plan remains appropriate    Co-evaluation              AM-PAC PT "6 Clicks" Mobility   Outcome Measure  Help needed turning from your back to your side while in a flat bed without using bedrails?: None Help needed moving from lying on your back to sitting on the side of a flat bed without using bedrails?: None Help needed moving to and from a bed to a chair (including a wheelchair)?: A Little Help  needed standing up from a chair using your arms (e.g., wheelchair or bedside chair)?: A Little Help needed to walk in hospital room?: A Little Help needed climbing 3-5 steps with a railing? : A Lot 6 Click Score: 19    End of Session   Activity Tolerance: Patient tolerated treatment well Patient left: in bed;with call bell/phone within reach;with bed alarm set Nurse Communication: Mobility status PT Visit Diagnosis: Other abnormalities of gait and mobility (R26.89);Pain Pain - Right/Left: Left Pain - part of body: Hand (abdomen)     Time: 3419-3790 PT Time Calculation (min) (ACUTE ONLY): 35 min  Charges:  $Gait Training: 8-22 mins $Therapeutic Activity: 8-22 mins                     Kara Stewart, PT  Acute Rehabilitation Services Pager [phone] Office [phone]    Kara Stewart 02/26/2020, 6:43 PM

## 2020-02-26 NOTE — Progress Notes (Signed)
OT Cancellation Note  Patient Details Name: Kimiya Brunelle MRN: [id number] DOB: 1975-04-26   Cancelled Treatment:    Reason Eval/Treat Not Completed: Patient declined, no reason specified;Other (comment) (Pt vomiting and not feeling well today. OT to follow-up.) Flora Lipps, OTR/L Acute Rehabilitation Services Pager: [phone] Office: [phone]  Flora Lipps 02/26/2020, 3:31 PM

## 2020-02-26 NOTE — Progress Notes (Addendum)
3 Days Post-Op   Subjective/Chief Complaint: Reports feeling worse last 24 hours. Cc "trapped air" and inability to burp. Also endorses abdominal pain and nausea. Got out of bed to go to the bathroom 3 times yesterday but was unable to work with therapies due to discomfort. Refused dinner last night due to feeling full/bloated. No vomiting. Denies flatus/BM.   Objective: Vital signs in last 24 hours: Temp:  [97.8 F (36.6 C)-98.2 F (36.8 C)] 97.8 F (36.6 C) (11/22 0500) Pulse Rate:  [70-86] 70 (11/22 0500) Resp:  [18-20] 18 (11/22 0500) BP: (100-111)/(60-66) 111/66 (11/22 0500) SpO2:  [100 %] 100 % (11/22 0500) Weight:  [74.8 kg] 74.8 kg (11/22 0500) Last BM Date:  (pt unsure)  Intake/Output from previous day: 11/21 0701 - 11/22 0700 In: 2868 [P.O.:600; I.V.:2128] Out: 70 [Drains:70] Intake/Output this shift: No intake/output data recorded.  General appearance: alert and cooperative GI: soft, appropriately tender; mild distention, present but hypoactive bowel sounds; no masses,  no organomegaly, Jp SS (70cc/24h), midlince incision with honeycomb in place.   Lab Results:  Recent Labs    02/23/20 2109 02/24/20 0433  WBC 21.7* 21.6*  HGB 12.5 11.5*  HCT 36.7 33.6*  PLT 83* 99*   BMET Recent Labs    02/23/20 2109 02/24/20 0433  NA 138 138  K 4.2 4.4  CL 107 107  CO2 18* 21*  GLUCOSE 174* 158*  BUN 6 8  CREATININE 0.99 0.96  CALCIUM 7.1* 7.3*   PT/INR Recent Labs    02/23/20 2109 02/24/20 0433  LABPROT 14.7 14.3  INR 1.2 1.2   ABG No results for input(s): PHART, HCO3 in the last 72 hours.  Invalid input(s): PCO2, PO2  Studies/Results: No results found.  Anti-infectives: Anti-infectives (From admission, onward)   Start     Dose/Rate Route Frequency Ordered Stop   02/23/20 1615  ciprofloxacin (CIPRO) IVPB 400 mg        400 mg 200 mL/hr over 60 Minutes Intravenous  Once 02/23/20 1608 02/23/20 1737      Assessment/Plan: Fall  G5 splenic  laceration - s/p ex lap, splenectomyby Dr. Freida Busman 11/19. AROBF. Add mylicon and IV pepcid for gas pain/reflux. Abdominal film to evaluate stomach/small bowel distention given worsening sxs of post-op ileus. Left impacted distal radius fracture - Hand c/s, Dr. Merlyn Lot, notified 11/19, confirmed notification 11/20 at 0745, continue splint, may f/u as o/p if ready for discharge prior to being seen in the hospital Hx SVT- metoprolol 12.5 BID Hx Hypothyroidism- reports this is untreated,TFTs WNL Hx ofprior alcohol Abuse- MVI/B1/folate FEN -clears, IVF @ 75cc/hr VTE -SCDs, LMWH to start this PM ID -splenectomy vaccines at discharge or 14d post-op, whichever comes first Foley- remove Dispo - floor, post-op ileus, PT/OT/OOB  Increase robaxin, add lidoderm patch. Follow up CBC, BMP, abdominal film. We discussed possibility of NG placement for worsening nausea, vomiting, distention, or massively dilated stomach on x-ray.   Addendum -- AM labs came back with hgb 8.3 from 11.5. TEG pending. CT abdomen pelvis ordered to rule out intra-abdominal bleeding. Hold lovenox and toradol.  K 3.4, will also replete potassium with goal K > 4.0.    LOS: 3 days    Adam Phenix 02/26/2020

## 2020-02-26 NOTE — Plan of Care (Signed)
Patient NPO for nausea. JP drained bloody liquod this shift. Kinsinger notified and no new orders given at this time. Anticipate repeat CBC this pm. Pt with flatulous and burping. Ambulated in hall with this Clinical research associate today.  Problem: Education: Goal: Knowledge of General Education information will improve Description: Including pain rating scale, medication(s)/side effects and non-pharmacologic comfort measures Outcome: Not Progressing   Problem: Health Behavior/Discharge Planning: Goal: Ability to manage health-related needs will improve Outcome: Not Progressing   Problem: Clinical Measurements: Goal: Ability to maintain clinical measurements within normal limits will improve Outcome: Not Progressing Goal: Will remain free from infection Outcome: Not Progressing Goal: Diagnostic test results will improve Outcome: Not Progressing Goal: Respiratory complications will improve Outcome: Not Progressing Goal: Cardiovascular complication will be avoided Outcome: Not Progressing   Problem: Activity: Goal: Risk for activity intolerance will decrease Outcome: Not Progressing   Problem: Nutrition: Goal: Adequate nutrition will be maintained Outcome: Not Progressing   Problem: Coping: Goal: Level of anxiety will decrease Outcome: Not Progressing   Problem: Elimination: Goal: Will not experience complications related to bowel motility Outcome: Not Progressing Goal: Will not experience complications related to urinary retention Outcome: Not Progressing   Problem: Pain Managment: Goal: General experience of comfort will improve Outcome: Not Progressing   Problem: Safety: Goal: Ability to remain free from injury will improve Outcome: Not Progressing   Problem: Skin Integrity: Goal: Risk for impaired skin integrity will decrease Outcome: Not Progressing   Problem: Activity: Goal: Ability to perform activities at highest level will improve Outcome: Not Progressing Goal:  Ability to avoid complications of mobility impairment will improve Outcome: Not Progressing Goal: Ability to tolerate increased activity will improve Outcome: Not Progressing Goal: Will remain free from falls Outcome: Not Progressing   Problem: Respiratory: Goal: Ability to maintain adequate ventilation will improve Outcome: Not Progressing   Problem: Tissue Perfusion: Goal: Hemodynamically stable with effective tissue perfusion will improve Outcome: Not Progressing Goal: Postoperative complications will be avoided or minimized Outcome: Not Progressing   Problem: Skin Integrity: Goal: Ability to maintain adequate tissue integrity will improve Outcome: Not Progressing   Problem: Infection: Goal: Risk for infection will decrease (spleen) Outcome: Not Progressing   Problem: Bowel/Gastric: Goal: Gastrointestinal status for postoperative course will improve Outcome: Not Progressing Goal: GI tract motility and GI tissue perfusion will improve Outcome: Not Progressing Goal: Ability to demonstrate the techniques of an individualized bowel program will improve Outcome: Not Progressing   Problem: Urinary Elimination: Goal: Ability to achieve a regular elimination pattern will improve Outcome: Not Progressing   Problem: Fluid Volume: Goal: Return to normovolemic state will improve Outcome: Not Progressing   Problem: Metabolic: Goal: Ability to maintain a metabolic balance will improve Outcome: Not Progressing   Problem: Education: Goal: Knowledge of the prescribed therapeutic regimen will improve Outcome: Not Progressing Goal: Knowledge of injury and care (of blunt abdominal trauma) will improve Outcome: Not Progressing   Problem: Coping: Goal: Exhibits appropriate coping mechanism and reduced anxiety resulting from physical and emotional stress Outcome: Not Progressing   Problem: Self-Concept: Goal: Ability to acknowledge body changes will improve Outcome: Not  Progressing

## 2020-02-27 LAB — TYPE AND SCREEN
ABO/RH(D): A POS
Antibody Screen: NEGATIVE
Unit division: 0
Unit division: 0

## 2020-02-27 LAB — CBC
HCT: 24.5 % — ABNORMAL LOW (ref 36.0–46.0)
Hemoglobin: 8.2 g/dL — ABNORMAL LOW (ref 12.0–15.0)
MCH: 33.3 pg (ref 26.0–34.0)
MCHC: 33.5 g/dL (ref 30.0–36.0)
MCV: 99.6 fL (ref 80.0–100.0)
Platelets: 228 10*3/uL (ref 150–400)
RBC: 2.46 MIL/uL — ABNORMAL LOW (ref 3.87–5.11)
RDW: 18.7 % — ABNORMAL HIGH (ref 11.5–15.5)
WBC: 13.8 10*3/uL — ABNORMAL HIGH (ref 4.0–10.5)
nRBC: 0.3 % — ABNORMAL HIGH (ref 0.0–0.2)

## 2020-02-27 LAB — COMPREHENSIVE METABOLIC PANEL
ALT: 24 U/L (ref 0–44)
AST: 27 U/L (ref 15–41)
Albumin: 2.1 g/dL — ABNORMAL LOW (ref 3.5–5.0)
Alkaline Phosphatase: 66 U/L (ref 38–126)
Anion gap: 8 (ref 5–15)
BUN: <5 mg/dL — ABNORMAL LOW (ref 6–20)
CO2: 21 mmol/L — ABNORMAL LOW (ref 22–32)
Calcium: 7.9 mg/dL — ABNORMAL LOW (ref 8.9–10.3)
Chloride: 107 mmol/L (ref 98–111)
Creatinine, Ser: 0.68 mg/dL (ref 0.44–1.00)
GFR, Estimated: >60 mL/min (ref >60)
Glucose, Bld: 93 mg/dL (ref 70–99)
Potassium: 3.8 mmol/L (ref 3.5–5.1)
Sodium: 136 mmol/L (ref 135–145)
Total Bilirubin: 1.1 mg/dL (ref 0.3–1.2)
Total Protein: 4.2 g/dL — ABNORMAL LOW (ref 6.5–8.1)

## 2020-02-27 LAB — BPAM RBC
Blood Product Expiration Date: 202112162359
Blood Product Expiration Date: 202112162359
ISSUE DATE / TIME: 202111191748
ISSUE DATE / TIME: 202111191748
Unit Type and Rh: 6200
Unit Type and Rh: 6200

## 2020-02-27 LAB — AMYLASE, BODY FLUID (OTHER): Amylase, Body Fluid: 25 U/L

## 2020-02-27 LAB — LIPASE, BLOOD: Lipase: 29 U/L (ref 11–51)

## 2020-02-27 LAB — HEPARIN LEVEL (UNFRACTIONATED): Heparin Unfractionated: <0.1 [IU]/mL — ABNORMAL LOW (ref 0.30–0.70)

## 2020-02-27 MED ORDER — HEPARIN (PORCINE) 25000 UT/250ML-% IV SOLN
1300.0000 [IU]/h | INTRAVENOUS | Status: AC
  Administered 2020-02-27: 1000 [IU]/h via INTRAVENOUS
  Administered 2020-02-28 – 2020-02-29 (×2): 1300 [IU]/h via INTRAVENOUS
  Filled 2020-02-27 (×4): qty 250

## 2020-02-27 MED ORDER — URSODIOL 300 MG PO CAPS
600.0000 mg | ORAL_CAPSULE | Freq: Two times a day (BID) | ORAL | Status: DC
  Administered 2020-02-27 – 2020-03-03 (×11): 600 mg via ORAL
  Filled 2020-02-27 (×12): qty 2

## 2020-02-27 NOTE — Progress Notes (Addendum)
4 Days Post-Op   Subjective/Chief Complaint: Reports abdominal discomfort with moving/coughing but is having flatus and reports some mucous per rectum. Denies nausea or vomiting this morning. Reports increased output from her JP drain, also reports a history of requiring paracentesis for ascites related to her liver disease. Reports feeling anxious about her new injuries. Has been getting up and down to the bathroom but not mobilizing in the halls.   Objective: Vital signs in last 24 hours: Temp:  [97.6 F (36.4 C)-98.5 F (36.9 C)] 97.6 F (36.4 C) (11/23 0546) Pulse Rate:  [75-83] 79 (11/23 0546) Resp:  [16-18] 18 (11/23 0546) BP: (104-119)/(61-65) 108/65 (11/23 0546) SpO2:  [98 %-100 %] 98 % (11/23 0546) Last BM Date:  (pt unsure)  Intake/Output from previous day: 11/22 0701 - 11/23 0700 In: 1848 [P.O.:60; I.V.:1390.4; IV Piggyback:257.7] Out: 1176 [Urine:501; Emesis/NG output:240; Drains:435] Intake/Output this shift: Total I/O In: -  Out: 150 [Urine:150]  General appearance: alert and cooperative GI: soft, appropriately tender; mild distention, +BS; no masses,  no organomegaly, Jp SS (435cc/24h, increased from yesterday), honeycomb removed and staples are c/d/i there is no cellulitis or erythema around incision.  Lab Results:  Recent Labs    02/26/20 2045 02/27/20 0126  WBC 15.0* 13.8*  HGB 8.2* 8.2*  HCT 24.0* 24.5*  PLT 206 228   BMET Recent Labs    02/26/20 1226 02/27/20 0126  NA 137 136  K 3.4* 3.8  CL 102 107  CO2 22 21*  GLUCOSE 78 93  BUN 5* <5*  CREATININE 0.68 0.68  CALCIUM 8.1* 7.9*   PT/INR No results for input(s): LABPROT, INR in the last 72 hours. ABG No results for input(s): PHART, HCO3 in the last 72 hours.  Invalid input(s): PCO2, PO2  Studies/Results: CT ABDOMEN PELVIS W CONTRAST  Result Date: 02/26/2020 CLINICAL DATA:  43 year old female with abdominal trauma. 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 EXAM: CT ABDOMEN AND  PELVIS WITH CONTRAST TECHNIQUE: Multidetector CT imaging of the abdomen and pelvis was performed using the standard protocol following bolus administration of intravenous contrast. CONTRAST:  OMNIPAQUE IOHEXOL 300 MG/ML  SOLN COMPARISON:  CT abdomen pelvis dated 02/23/2020. FINDINGS: Lower chest: There are trace bilateral pleural effusions with small bibasilar atelectasis. No intra-abdominal free air. Small perihepatic free fluid. Hepatobiliary: There is fatty infiltration of the liver. There is slight irregularity of the liver contour which may represent early changes of cirrhosis. Clinical correlation is recommended. No intrahepatic biliary ductal dilatation. Layering high attenuating content within the gallbladder may represent sludge, small stones, or vicarious excretion of intravenous contrast. No pericholecystic fluid or evidence of acute cholecystitis by CT. Pancreas: There is edema and fluid surrounding the pancreas which may be related to postsurgical changes. Correlation with pancreatic enzymes recommended to exclude acute pancreatitis. Spleen: Status post splenectomy. There is a small amount of fluid in the surgical bed, likely post operative seroma. There is a drainage catheter with tip in the left upper abdomen in the region of the fluid. Adrenals/Urinary Tract: The adrenal glands unremarkable. There is no hydronephrosis on either side. There is symmetric enhancement and excretion of contrast by both kidneys. The visualized ureters and urinary bladder appear unremarkable. Stomach/Bowel: There is no bowel obstruction or active inflammation. The appendix is normal. Vascular/Lymphatic: The abdominal aorta and IVC unremarkable. There is thrombus within the main portal vein and central intrahepatic portal venous branches. No portal venous gas. Postsurgical changes of proximal splenic vein. There is no adenopathy. Reproductive: The uterus is  anteverted and grossly unremarkable. Bilateral tubal ligation  clips. There is a 3 cm right ovarian cyst. Other: Midline vertical anterior abdominal wall surgical incision and cutaneous clips. No fluid collection. Musculoskeletal: No acute or significant osseous findings. IMPRESSION: 1. Thrombus within the main portal vein and central intrahepatic portal venous branches. 2. Status post splenectomy. Small amount of fluid in the surgical bed, likely post operative seroma. 3. Edema and fluid surrounding the pancreas may be related to postsurgical changes. Correlation with pancreatic enzymes recommended to exclude acute pancreatitis. 4. Fatty liver with possible early changes of cirrhosis. Clinical correlation is recommended. 5. A 3 cm right ovarian cyst. 6. Trace bilateral pleural effusions with small bibasilar atelectasis. These results were called by telephone at the time of interpretation on 02/26/2020 at 5:46 pm to Dr. Sheliah Hatch, who verbally acknowledged these results. Electronically Signed   By: Elgie Collard M.D.   On: 02/26/2020 17:59   DG Abd Portable 1V  Result Date: 02/26/2020 CLINICAL DATA:  Postoperative ileus.  Abdominal distension. EXAM: PORTABLE ABDOMEN - 1 VIEW COMPARISON:  January 22, 2015. FINDINGS: Surgical drain is seen in the left side of the abdomen with the distal tip in the expected position of the left upper quadrant. Sternotomy wires are noted. No colonic dilatation is noted. Mildly dilated small bowel loops are noted which most likely represents postoperative ileus. IMPRESSION: Mildly dilated small bowel loops are noted which most likely represents postoperative ileus. Follow-up radiographs are recommended. Electronically Signed   By: Lupita Raider M.D.   On: 02/26/2020 09:12    Anti-infectives: Anti-infectives (From admission, onward)   Start     Dose/Rate Route Frequency Ordered Stop   02/23/20 1615  ciprofloxacin (CIPRO) IVPB 400 mg        400 mg 200 mL/hr over 60 Minutes Intravenous  Once 02/23/20 1608 02/23/20 1737       Assessment/Plan: Fall  G5 splenic laceration - s/p ex lap, splenectomy, liver bxby Dr. Freida Busman 11/19. Await surgical path. AROBF. continue mylicon and IV pepcid. Repeat CT abdomen/pelvis ordered 11/22 due to worsening ileus and ABL anemia, no bleeding was found but PVT was identified.  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 Dr. Bedelia Person spoke with oncologist on call regarding possibility of a chronic underlying hypercoagulable disorder and this is felt to be unlikely. Could consider follow up with hematologist Dr. Candise Che or Dr. Leonides Schanz after hospitalization.  Left impacted distal radius fracture - Hand c/s, Dr. Merlyn Lot, continue splint, may f/u as o/p for repeat X-rays and evaluation. Hx SVT- metoprolol 12.5 BID Hx Hypothyroidism- reports this is untreated,TFTs WNL PMH chronic liver disease, alcoholic hepatitis, elevated AMA and presumed PBC - followed by [facility] gastroenterology, reordered home ursodiol, continue  MVI/B1/folate. LFTs and lipase are WNL today.  FEN -clears, IVF @ 75cc/hr VTE -SCDs, hep gtt w/o bolus  ID -splenectomy vaccines at discharge or 14d post-op, whichever comes first Foley- removed and voiding spontaneously Dispo - floor, post-op ileus, hep gtt for PVT, follow surgical path Place JP drain to gravity and closely monitor    LOS: 4 days    Adam Phenix 02/27/2020

## 2020-02-27 NOTE — Evaluation (Signed)
Occupational Therapy Evaluation Patient Details Name: Kara Stewart MRN: [id number] DOB: 15-Apr-1975 Today's Date: 02/27/2020    History of Present Illness 44 y.o. female with a history of prior alcohol abuse w/ alcohol related hepatitis and hypothyroidism who was transferred from [facility] after a fall and found to have laceration and left impacted distal radius fracture. Pt found to have grade 5 spleen laceration, L impacted radius fx. 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 Pt currently Unable to perform figure 4 technique at EOB; able to perform lying in supine, but LUE unable to assist. Pt currently modA overall for ADL due to pain in abdomen/trunk. Pt minguardA for ADL functional mobility with L platform RW. Pt's LUE elevated and HEP initiated. Pt would benefit from continued OT skilled services. OT following acutely.     Follow Up Recommendations  Follow surgeon's recommendation for DC plan and follow-up therapies (possible OP OT once pt sees ortho MD post d/c)    Equipment Recommendations  None recommended by OT    Recommendations for Other Services       Precautions / Restrictions Precautions Precautions: Fall Required Braces or Orthoses: Splint/Cast Splint/Cast: L wrist splint Restrictions Weight Bearing Restrictions: Yes LUE Weight Bearing: Non weight bearing      Mobility Bed Mobility Overal bed mobility: Needs Assistance Bed Mobility: Supine to Sit Rolling: Supervision   Supine to sit: Min guard Sit to supine: Min guard   General bed mobility comments: assist for line management and to adjust pillows/pads    Transfers Overall transfer level: Needs assistance Equipment used: Left platform walker Transfers: Sit to/from Stand;Stand Pivot Transfers Sit to Stand: Min guard Stand pivot transfers: Min guard       General transfer comment: Assist for lines/IV management    Balance Overall  balance assessment: Needs assistance   Sitting balance-Leahy Scale: Good       Standing balance-Leahy Scale: Fair                             ADL either performed or assessed with clinical judgement   ADL Overall ADL's : Needs assistance/impaired Eating/Feeding: Set up;Sitting   Grooming: Min guard;Standing   Upper Body Bathing: Minimal assistance;Sitting   Lower Body Bathing: Moderate assistance;Cueing for safety;Sitting/lateral leans;Sit to/from stand Lower Body Bathing Details (indicate cue type and reason): Unable to perform figure 4 technique at EOB; able to perform lying supine, but LUE unable to assist. Upper Body Dressing : Set up;Sitting   Lower Body Dressing: Moderate assistance;Sitting/lateral leans;Sit to/from stand;Cueing for safety Lower Body Dressing Details (indicate cue type and reason): needs AE education Toilet Transfer: Radiographer, therapeutic Details (indicate cue type and reason): supervisionA with RW for lines Toileting- Clothing Manipulation and Hygiene: Supervision/safety;Sitting/lateral lean;Sit to/from stand       Functional mobility during ADLs: Min guard;Rolling walker (with LUE platform) General ADL Comments: Pt limited greatly by pain, decreased strength and edurance for performing ADL/mobility. Pt encouraged to get OOB.     Vision Baseline Vision/History: No visual deficits Patient Visual Report: No change from baseline Vision Assessment?: No apparent visual deficits     Perception     Praxis      Pertinent Vitals/Pain Pain Assessment: 0-10 Pain Score: 6  Pain Location: abdomen Pain Descriptors / Indicators: Sore Pain Intervention(s): Monitored during session;Premedicated before session;Repositioned     Hand Dominance Right   Extremity/Trunk Assessment  Upper Extremity Assessment Upper Extremity Assessment: Generalized weakness;RUE deficits/detail;LUE deficits/detail RUE Deficits / Details: AROM, WFLs LUE  Deficits / Details: shoulder, elbow, digits ROM WFLs; wrist immobilized in brace LUE: Unable to fully assess due to immobilization LUE Coordination: decreased fine motor;decreased gross motor   Lower Extremity Assessment Lower Extremity Assessment: Defer to PT evaluation   Cervical / Trunk Assessment Cervical / Trunk Assessment: Other exceptions Cervical / Trunk Exceptions: dressing on stomach/trunk   Communication Communication Communication: No difficulties   Cognition Arousal/Alertness: Awake/alert Behavior During Therapy: WFL for tasks assessed/performed Overall Cognitive Status: Within Functional Limits for tasks assessed                                 General Comments: Tearful at spending Thanksgiving in teh hospital; misses her 67 month old daughter   General Comments  VSS on RA. Education on HEP for LUE not involving the wrist and elevation of LUE    Exercises Exercises: Other exercises;General Upper Extremity General Exercises - Upper Extremity Shoulder Flexion: AROM;Supine;Left Elbow Flexion: AROM;Supine;Seated;Left Elbow Extension: AROM;Left Digit Composite Flexion: AROM;Left;Seated Composite Extension: AROM;Both;Seated   Shoulder Instructions      Home Living Family/patient expects to be discharged to:: Private residence Living Arrangements: Spouse/significant other (boyfriend) Available Help at Discharge: Family;Available PRN/intermittently Type of Home: House Home Access: Stairs to enter [employer] of Steps: 4 Entrance Stairs-Rails: Can reach both Home Layout: One level     Bathroom Shower/Tub: Tub/shower unit;Walk-in shower   Bathroom Toilet: Standard     Home Equipment: None          Prior Functioning/Environment Level of Independence: Independent        Comments: pt does report history of falls due to nerve issue in back resulting in L leg buckling        OT Problem List: Decreased strength;Decreased activity  tolerance;Pain;Impaired UE functional use;Increased edema      OT Treatment/Interventions: Self-care/ADL training;Therapeutic exercise;Energy conservation;Therapeutic activities;Patient/family education;Balance training    OT Goals(Current goals can be found in the care plan section) Acute Rehab OT Goals Patient Stated Goal: to be able to get dressed OT Goal Formulation: With patient Time For Goal Achievement: 03/12/20 Potential to Achieve Goals: Good ADL Goals Pt Will Perform Lower Body Dressing: with set-up;sitting/lateral leans;sit to/from stand;with adaptive equipment Pt/caregiver will Perform Home Exercise Program: Increased ROM;Left upper extremity;With Supervision Additional ADL Goal #1: Pt will tolerate x10 mins of OOB ADL task with 1 seated rest break in order to maximize ADL performance.  OT Frequency: Min 1X/week   Barriers to D/C:            Co-evaluation              AM-PAC OT "6 Clicks" Daily Activity     Outcome Measure Help from another person eating meals?: None Help from another person taking care of personal grooming?: None Help from another person toileting, which includes using toliet, bedpan, or urinal?: A Little Help from another person bathing (including washing, rinsing, drying)?: A Little Help from another person to put on and taking off regular upper body clothing?: A Little Help from another person to put on and taking off regular lower body clothing?: A Little 6 Click Score: 20   End of Session Equipment Utilized During Treatment: Rolling walker Nurse Communication: Mobility status  Activity Tolerance: Patient limited by pain;Patient tolerated treatment well Patient left: in bed;with call bell/phone within reach  OT Visit Diagnosis: Unsteadiness on feet (R26.81);Muscle weakness (generalized) (M62.81);Pain Pain - Right/Left: Left Pain - part of body: Arm (wrist)                Time: 8341-9622 OT Time Calculation (min): 27 min Charges:  OT  General Charges $OT Visit: 1 Visit OT Evaluation $OT Eval Moderate Complexity: 1 Mod OT Treatments $Therapeutic Exercise: 8-22 mins  Flora Lipps, OTR/L Acute Rehabilitation Services Pager: [phone] Office: [phone]   Mylene Bow C 02/27/2020, 5:34 PM

## 2020-02-27 NOTE — Progress Notes (Signed)
Physical Therapy Treatment Patient Details Name: Kara Stewart MRN: [id number] DOB: 06/03/1975 Today's Date: 02/27/2020    History of Present Illness 44 y.o. female with a history of prior alcohol abuse w/ alcohol related hepatitis and hypothyroidism who was transferred from [facility] after a fall and found to have laceration and left impacted distal radius fracture. Pt found to have grade 5 spleen laceration, L impacted radius fx. 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 Marland Kitchen . probably cane)    Recommendations for Other Services       Precautions / Restrictions Precautions Precautions: Fall Required Braces or Orthoses: Splint/Cast Splint/Cast: L wrist splint Restrictions LUE Weight Bearing: Non weight bearing    Mobility  Bed Mobility Overal bed mobility: Needs Assistance Bed Mobility: Supine to Sit     Supine to sit: Min guard     General bed mobility comments: Minguard for afety and line management  Transfers Overall transfer level: Needs assistance Equipment used: 1 person hand held assist;None (grab bar off of toilet) Transfers: Sit to/from Stand Sit to Stand: Min guard         General transfer comment: Minguard for safety, and cues to self monitor for activity tolerance  Ambulation/Gait Ambulation/Gait assistance: Min guard (with and without physical contact) Gait Distance (Feet): 180 Feet Assistive device: Left platform walker;IV Pole Gait Pattern/deviations: Step-through pattern;Decreased step length - right;Decreased step length - left      General Gait Details: Cues to self-monitor for activity tolerance; Midway through walk, set teh platform RW aside, and pt used R hand on IV pole, and L hand to splint her abdomen at the surgical site; slow cadence   Stairs             Wheelchair Mobility    Modified Rankin (Stroke Patients Only)       Balance     Sitting balance-Leahy Scale: Good       Standing balance-Leahy Scale: Fair                              Cognition Arousal/Alertness: Awake/alert Behavior During Therapy: WFL for tasks assessed/performed Overall Cognitive Status: Within Functional Limits for tasks assessed                                 General Comments: Tearful at spending Thanksgiving in teh hospital; misses her 42 month old daughter      Exercises      General Comments General comments (skin integrity, edema, etc.): Session conducted on Room Air and O2 sats were 97% at end of session      Pertinent Vitals/Pain Pain Assessment: 0-10 Pain Score: 7  Pain Location: abdomen Pain Descriptors / Indicators: Sore Pain Intervention(s): Monitored during session;Premedicated before session    Home Living                      Prior Function  PT Goals (current goals can now be found in the care plan section) Acute Rehab PT Goals Patient Stated Goal: To return to independent mobility PT Goal Formulation: With patient Time For Goal Achievement: 03/09/20 Potential to Achieve Goals: Good Progress towards PT goals: Progressing toward goals    Frequency    Min 5X/week      PT Plan Current plan remains appropriate    Co-evaluation              AM-PAC PT "6 Clicks" Mobility   Outcome Measure  Help needed turning from your back to your side while in a flat bed without using bedrails?: None Help needed moving from lying on your back to sitting on the side of a flat bed without using bedrails?: None Help needed moving to and from  a bed to a chair (including a wheelchair)?: A Little Help needed standing up from a chair using your arms (e.g., wheelchair or bedside chair)?: A Little Help needed to walk in hospital room?: A Little Help needed climbing 3-5 steps with a railing? : A Lot 6 Click Score: 19    End of Session   Activity Tolerance: Patient tolerated treatment well Patient left: in chair;with call bell/phone within reach;Other (comment) (wit the paln to ge tin the cair that helps with gas relief) Nurse Communication: Mobility status PT Visit Diagnosis: Other abnormalities of gait and mobility (R26.89);Pain Pain - Right/Left: Left Pain - part of body: Hand (abdomen)     Time: 4132-4401 PT Time Calculation (min) (ACUTE ONLY): 37 min  Charges:  $Gait Training: 23-37 mins                     Van Clines, [place]  Acute Rehabilitation Services Pager [phone] Office [phone]    Levi Aland 02/27/2020, 2:28 PM

## 2020-02-27 NOTE — Progress Notes (Signed)
ANTICOAGULATION CONSULT NOTE  Pharmacy Consult for heparin Indication: portal vein thrombus  Heparin Dosing Weight: 68 kg  Labs: Recent Labs    02/26/20 1226 02/26/20 1226 02/26/20 2045 02/27/20 0126  HGB 8.3*   < > 8.2* 8.2*  HCT 24.7*  --  24.0* 24.5*  PLT 191  --  206 228  CREATININE 0.68  --   --  0.68   < > = values in this interval not displayed.    Assessment: Kara Stewart presenting 11/19 s/p multiple falls with ruptured spleen, s/p ex-lap, splenectomy 11/19. Patient now found to have acute portal vein thrombus. Pharmacy consulted to dose heparin with no bolus and targeting lower goal. Patient is not on anticoagulation PTA, currently on SCDs only inpatient. Hg low but stable at 8.2, plt wnl. No current active bleed issues reported. CTA negative for PE on 11/19.  Goal of Therapy:  Heparin level 0.3-0.5 units/ml, no bolus Monitor platelets by anticoagulation protocol: Yes   Plan:  No bolus. Start heparin at 1000 units/hr Check 6hr heparin level Monitor daily CBC, s/sx bleeding   Babs Bertin, PharmD, BCPS Clinical Pharmacist 02/27/2020 7:55 AM

## 2020-02-27 NOTE — Progress Notes (Signed)
ANTICOAGULATION CONSULT NOTE  Pharmacy Consult for heparin Indication: portal vein thrombus 02/26/20  Weight 74.8kg Height 5'3" Heparin Dosing Weight: 72 kg  Labs: Recent Labs    02/26/20 1226 02/26/20 1226 02/26/20 2045 02/27/20 0126 02/27/20 1811  HGB 8.3*   < > 8.2* 8.2*  --   HCT 24.7*  --  24.0* 24.5*  --   PLT 191  --  206 228  --   HEPARINUNFRC  --   --   --   --  <0.10*  CREATININE 0.68  --   --  0.68  --    < > = values in this interval not displayed.    Assessment: 48 yof presenting 11/19 s/p multiple falls with ruptured spleen, s/p ex-lap, splenectomy 11/19. Patient found to have acute portal vein thrombus 02/26/20. Pharmacy consulted to dose heparin with no bolus and targeting lower goal. Patient is not on anticoagulation PTA.  First HL undetectable.   Goal of Therapy:  Heparin level 0.3-0.5 units/ml, no bolus Monitor platelets by anticoagulation protocol: Yes   Plan:  No bolus. Incr heparin to 1250 units/hr Check 6hr heparin level Monitor daily HL, CBC/plt Monitor for signs/symptoms of bleeding    Alphia Moh, PharmD, BCPS, BCCP Clinical Pharmacist  Please check AMION for all [facility] Pharmacy phone numbers After 10:00 PM, call Main Pharmacy [phone]

## 2020-02-28 LAB — CBC
HCT: 24.8 % — ABNORMAL LOW (ref 36.0–46.0)
Hemoglobin: 8.4 g/dL — ABNORMAL LOW (ref 12.0–15.0)
MCH: 34.3 pg — ABNORMAL HIGH (ref 26.0–34.0)
MCHC: 33.9 g/dL (ref 30.0–36.0)
MCV: 101.2 fL — ABNORMAL HIGH (ref 80.0–100.0)
Platelets: 299 10*3/uL (ref 150–400)
RBC: 2.45 MIL/uL — ABNORMAL LOW (ref 3.87–5.11)
RDW: 19 % — ABNORMAL HIGH (ref 11.5–15.5)
WBC: 11.3 10*3/uL — ABNORMAL HIGH (ref 4.0–10.5)
nRBC: 0.6 % — ABNORMAL HIGH (ref 0.0–0.2)

## 2020-02-28 LAB — CULTURE, BLOOD (ROUTINE X 2)
Culture: NO GROWTH
Culture: NO GROWTH
Special Requests: ADEQUATE
Special Requests: ADEQUATE

## 2020-02-28 LAB — SURGICAL PATHOLOGY

## 2020-02-28 LAB — HEPARIN LEVEL (UNFRACTIONATED)
Heparin Unfractionated: 0.27 [IU]/mL — ABNORMAL LOW (ref 0.30–0.70)
Heparin Unfractionated: 0.43 [IU]/mL (ref 0.30–0.70)

## 2020-02-28 MED ORDER — POTASSIUM CHLORIDE 20 MEQ PO PACK
20.0000 meq | PACK | Freq: Once | ORAL | Status: AC
  Administered 2020-02-28: 20 meq via ORAL
  Filled 2020-02-28: qty 1

## 2020-02-28 MED ORDER — FAMOTIDINE 20 MG PO TABS
20.0000 mg | ORAL_TABLET | Freq: Two times a day (BID) | ORAL | Status: DC
  Administered 2020-02-28 – 2020-03-03 (×9): 20 mg via ORAL
  Filled 2020-02-28 (×9): qty 1

## 2020-02-28 MED ORDER — PROMETHAZINE HCL 25 MG/ML IJ SOLN: 12.5000 mg | Freq: Four times a day (QID) | INTRAMUSCULAR | Status: DC | PRN

## 2020-02-28 NOTE — Progress Notes (Signed)
5 Days Post-Op   Subjective/Chief Complaint: NAEO. Had a small, nonbloody BM this AM. Have a large amt flatus. Walking in the halls with PT yesterday. Tolerating clears.    Objective: Vital signs in last 24 hours: Temp:  [97.8 F (36.6 C)-98.6 F (37 C)] 98.5 F (36.9 C) (11/24 0314) Pulse Rate:  [74-89] 81 (11/24 0314) Resp:  [16-18] 18 (11/24 0314) BP: (103-115)/(57-71) 106/66 (11/24 0314) SpO2:  [97 %-100 %] 98 % (11/24 0314) Last BM Date:  (PTA)  Intake/Output from previous day: 11/23 0701 - 11/24 0700 In: 300 [P.O.:300] Out: 2035 [Urine:1650; Drains:385] Intake/Output this shift: Total I/O In: 100 [Other:100] Out: -   General appearance: alert and cooperative GI: soft, appropriately tender; non-distended+BS; no masses,  no organomegaly, Jp SS, staples are c/d/i there is no cellulitis or erythema around incision.  Lab Results:  Recent Labs    02/27/20 0126 02/28/20 0042  WBC 13.8* 11.3*  HGB 8.2* 8.4*  HCT 24.5* 24.8*  PLT 228 299   BMET Recent Labs    02/26/20 1226 02/27/20 0126  NA 137 136  K 3.4* 3.8  CL 102 107  CO2 22 21*  GLUCOSE 78 93  BUN 5* <5*  CREATININE 0.68 0.68  CALCIUM 8.1* 7.9*   PT/INR No results for input(s): LABPROT, INR in the last 72 hours. ABG No results for input(s): PHART, HCO3 in the last 72 hours.  Invalid input(s): PCO2, PO2  Studies/Results: CT ABDOMEN PELVIS W CONTRAST  Result Date: 02/26/2020 CLINICAL DATA:  44 year old female with abdominal trauma. 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 EXAM: CT ABDOMEN AND PELVIS WITH CONTRAST TECHNIQUE: Multidetector CT imaging of the abdomen and pelvis was performed using the standard protocol following bolus administration of intravenous contrast. CONTRAST:  OMNIPAQUE IOHEXOL 300 MG/ML  SOLN COMPARISON:  CT abdomen pelvis dated 02/23/2020. FINDINGS: Lower chest: There are trace bilateral pleural effusions with small bibasilar atelectasis. No intra-abdominal free air.  Small perihepatic free fluid. Hepatobiliary: There is fatty infiltration of the liver. There is slight irregularity of the liver contour which may represent early changes of cirrhosis. Clinical correlation is recommended. No intrahepatic biliary ductal dilatation. Layering high attenuating content within the gallbladder may represent sludge, small stones, or vicarious excretion of intravenous contrast. No pericholecystic fluid or evidence of acute cholecystitis by CT. Pancreas: There is edema and fluid surrounding the pancreas which may be related to postsurgical changes. Correlation with pancreatic enzymes recommended to exclude acute pancreatitis. Spleen: Status post splenectomy. There is a small amount of fluid in the surgical bed, likely post operative seroma. There is a drainage catheter with tip in the left upper abdomen in the region of the fluid. Adrenals/Urinary Tract: The adrenal glands unremarkable. There is no hydronephrosis on either side. There is symmetric enhancement and excretion of contrast by both kidneys. The visualized ureters and urinary bladder appear unremarkable. Stomach/Bowel: There is no bowel obstruction or active inflammation. The appendix is normal. Vascular/Lymphatic: The abdominal aorta and IVC unremarkable. There is thrombus within the main portal vein and central intrahepatic portal venous branches. No portal venous gas. Postsurgical changes of proximal splenic vein. There is no adenopathy. Reproductive: The uterus is anteverted and grossly unremarkable. Bilateral tubal ligation clips. There is a 3 cm right ovarian cyst. Other: Midline vertical anterior abdominal wall surgical incision and cutaneous clips. No fluid collection. Musculoskeletal: No acute or significant osseous findings. IMPRESSION: 1. Thrombus within the main portal vein and central intrahepatic portal venous branches. 2. Status post splenectomy. Small  amount of fluid in the surgical bed, likely post operative seroma.  3. Edema and fluid surrounding the pancreas may be related to postsurgical changes. Correlation with pancreatic enzymes recommended to exclude acute pancreatitis. 4. Fatty liver with possible early changes of cirrhosis. Clinical correlation is recommended. 5. A 3 cm right ovarian cyst. 6. Trace bilateral pleural effusions with small bibasilar atelectasis. These results were called by telephone at the time of interpretation on 02/26/2020 at 5:46 pm to Dr. Sheliah Hatch, who verbally acknowledged these results. Electronically Signed   By: Elgie Collard M.D.   On: 02/26/2020 17:59    Anti-infectives: Anti-infectives (From admission, onward)   Start     Dose/Rate Route Frequency Ordered Stop   02/23/20 1615  ciprofloxacin (CIPRO) IVPB 400 mg        400 mg 200 mL/hr over 60 Minutes Intravenous  Once 02/23/20 1608 02/23/20 1737      Assessment/Plan: Fall  G5 splenic laceration - s/p ex lap, splenectomy, liver bxby Dr. Freida Busman 11/19. Await surgical path. AROBF. continue mylicon and IV pepcid. Repeat CT abdomen/pelvis ordered 11/22 due to worsening ileus and ABL anemia, no bleeding was found but PVT was identified.  Portal vein thrombus - noted on CT 11/22, hep gtt started 11/23 w/o bolus. Hep level 0.27 this AM (Goal 0.3-0.5). monitor for s/s bleeding. Dr. Bedelia Person spoke with oncologist on call regarding possibility of a chronic underlying hypercoagulable disorder and this is felt to be unlikely. Could consider follow up with hematologist Dr. Candise Che or Dr. Leonides Schanz after hospitalization.  Left impacted distal radius fracture - Hand c/s, Dr. Merlyn Lot, continue splint, may f/u as o/p for repeat X-rays and evaluation. Hx SVT- metoprolol 12.5 BID Hx Hypothyroidism- reports this is untreated,TFTs WNL PMH chronic liver disease, alcoholic hepatitis, elevated AMA and presumed PBC - [facility], reordered home ursodiol, continue  MVI/B1/folate. LFTs and lipase are WNL today. ABL anemia -  stable, hgb 8.4 from 8.2. HR and BP WNL. No orthostatic sxs or CP/palpitations/dizziness at rest.   FEN -advance to FLD, saline lock IV VTE -SCDs, hep gtt  ID -splenectomy vaccines at discharge or 14d post-op, whichever comes first Foley- removed and voiding spontaneously Dispo - floor, post-op ileus improving so advancing diet, hep gtt for PVT, follow surgical path   LOS: 5 days    Kara Stewart 02/28/2020

## 2020-02-28 NOTE — Progress Notes (Signed)
ANTICOAGULATION CONSULT NOTE  Pharmacy Consult for heparin Indication: portal vein thrombus 02/26/20  Weight 74.8kg Height 5'3" Heparin Dosing Weight: 72 kg  Labs: Recent Labs    02/26/20 1226 02/26/20 1226 02/26/20 2045 02/26/20 2045 02/27/20 0126 02/27/20 1811 02/28/20 0042 02/28/20 1108  HGB 8.3*   < > 8.2*   < > 8.2*  --  8.4*  --   HCT 24.7*   < > 24.0*  --  24.5*  --  24.8*  --   PLT 191   < > 206  --  228  --  299  --   HEPARINUNFRC  --   --   --   --   --  <0.10* 0.27* 0.43  CREATININE 0.68  --   --   --  0.68  --   --   --    < > = values in this interval not displayed.    Assessment: 66 yof presenting 11/19 s/p multiple falls with ruptured spleen, s/p ex-lap, splenectomy 11/19. Patient found to have acute portal vein thrombus 02/26/20. Pharmacy consulted to dose heparin with no bolus and targeting lower goal. Patient is not on anticoagulation PTA.  Heparin level therapeutic at 0.43. CBC stable. No active bleed issues reported.  Goal of Therapy:  Heparin level 0.3-0.5 units/ml, no bolus Monitor platelets by anticoagulation protocol: Yes   Plan:  Continue heparin at 1300 units/hr Monitor daily heparin level and CBC, s/sx bleeding   Leia Alf, PharmD, BCPS Please check AMION for all [facility] Pharmacy contact numbers Clinical Pharmacist 02/28/2020 12:10 PM

## 2020-02-28 NOTE — Progress Notes (Signed)
Physical Therapy Treatment Patient Details Name: Kara Stewart MRN: [id number] DOB: [birth year] Today's Date: 02/28/2020    History of Present Illness 44 y.o. female with a history of prior alcohol abuse w/ alcohol related hepatitis and hypothyroidism who [facility] after a [facility] and found to have laceration and left impacted distal radius fracture. Pt found to have grade 5 spleen laceration, L impacted radius fx. 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    PT Comments    Continuing work on functional mobility and activity tolerance;  Difficulty getting pain under control this afternoon, bu pt willing ot walk some, though not in the hallways (did laps in the room); Used straight cane well; Will leave platform RW in room for her use as needed while inpatient -- will consider cane versus no device for amb, depending on progress   Follow Up Recommendations  Home health PT;Supervision - Intermittent (May progress to not needing HHPT)     Equipment Recommendations  Cane;Other (comment) (versus no device)    Recommendations for Other Services       Precautions / Restrictions Precautions Precautions: Fall Required Braces or Orthoses: Splint/Cast Splint/Cast: L wrist splint Restrictions LUE Weight Bearing: Non weight bearing    Mobility  Bed Mobility Overal bed mobility: Needs Assistance Bed Mobility: Supine to Sit Rolling: Supervision         General bed mobility comments: assist for line management and to adjust pillows/pads  Transfers Overall transfer level: Needs assistance Equipment used: Straight cane Transfers: Sit to/from Stand Sit to Stand: Min guard         General transfer comment: Assist for lines/IV management  Ambulation/Gait Ambulation/Gait assistance: Min guard (with and without physical contact) Gait Distance (Feet): 60 Feet (laps in room; pt not wanting to walk hallway) Assistive device: Straight cane Gait Pattern/deviations:  Step-through pattern;Decreased step length - right;Decreased step length - left Gait velocity: reduced   General Gait Details: Good use of straight cane for stability and balance; cues to self-monitor for activity tolerance   Stairs             Wheelchair Mobility    Modified Rankin (Stroke Patients Only)       Balance     Sitting balance-Leahy Scale: Good       Standing balance-Leahy Scale: Fair                              Cognition Arousal/Alertness: Awake/alert Behavior During Therapy: WFL for tasks assessed/performed Overall Cognitive Status: Within Functional Limits for tasks assessed                                 General Comments: Tearful at spending Thanksgiving in teh hospital; misses her 13 month old daughter      Exercises      General Comments General comments (skin integrity, edema, etc.): VSS; some lightheadedness, possibly related to pain with trunk extension -- tends to close eyes      Pertinent Vitals/Pain Pain Assessment: Faces Faces Pain Scale: Hurts whole lot Pain Location: abdomen Pain Descriptors / Indicators: Sore Pain Intervention(s): Monitored during session;Premedicated before session    Home Living                      Prior Function            PT Goals (current goals  can now be found in the care plan section) Acute Rehab PT Goals Patient Stated Goal: pain to decrease PT Goal Formulation: With patient Time For Goal Achievement: 03/09/20 Potential to Achieve Goals: Good Progress towards PT goals: Progressing toward goals    Frequency    Min 5X/week      PT Plan Current plan remains appropriate    Co-evaluation              AM-PAC PT "6 Clicks" Mobility   Outcome Measure  Help needed turning from your back to your side while in a flat bed without using bedrails?: None Help needed moving from lying on your back to sitting on the side of a flat bed without using  bedrails?: None Help needed moving to and from a bed to a chair (including a wheelchair)?: A Little Help needed standing up from a chair using your arms (e.g., wheelchair or bedside chair)?: A Little Help needed to walk in hospital room?: A Little Help needed climbing 3-5 steps with a railing? : A Little 6 Click Score: 20    End of Session   Activity Tolerance: Patient tolerated treatment well Patient left: in bed;with call bell/phone within reach Nurse Communication: Mobility status PT Visit Diagnosis: Other abnormalities of gait and mobility (R26.89);Pain Pain - Right/Left: Left Pain - part of body: Hand (abdomen)     Time: 6834-1962 PT Time Calculation (min) (ACUTE ONLY): 30 min  Charges:  $Gait Training: 8-22 mins $Therapeutic Activity: 8-22 mins                     Van Clines, PT  Acute Rehabilitation Services Pager [phone] Office [phone]    Kara Stewart 02/28/2020, 6:18 PM

## 2020-02-28 NOTE — Progress Notes (Signed)
At previous vomiting, pt stated she saw little flashing lights out of the corners of her eyes. They are the only symptom prior to vomiting that she was still experiencing at 1900. Other than that, pt resting.

## 2020-02-28 NOTE — Progress Notes (Signed)
Pt felt hot, nauseous, pain in left wrist. Assesed vitalls.  Administered phenegran 6.25 mg IV and dilaudid 1 mg IV. Pt vomitted 175 mL orange colored emesis. Pt stable. Pain and nausea subsided. Pt sitting up in bed talking.

## 2020-02-28 NOTE — Progress Notes (Signed)
ANTICOAGULATION CONSULT NOTE  Pharmacy Consult for heparin Indication: portal vein thrombus 02/26/20  Weight 74.8kg Height 5'3" Heparin Dosing Weight: 72 kg  Labs: Recent Labs    02/26/20 1226 02/26/20 1226 02/26/20 2045 02/26/20 2045 02/27/20 0126 02/27/20 1811 02/28/20 0042  HGB 8.3*   < > 8.2*   < > 8.2*  --  8.4*  HCT 24.7*   < > 24.0*  --  24.5*  --  24.8*  PLT 191   < > 206  --  228  --  299  HEPARINUNFRC  --   --   --   --   --  <0.10* 0.27*  CREATININE 0.68  --   --   --  0.68  --   --    < > = values in this interval not displayed.    Assessment: 5 yof presenting 11/19 s/p multiple falls with ruptured spleen, s/p ex-lap, splenectomy 11/19. Patient found to have acute portal vein thrombus 02/26/20. Pharmacy consulted to dose heparin with no bolus and targeting lower goal. Patient is not on anticoagulation PTA.  First HL undetectable.   11/24 AM update:  Heparin level just below goal  Goal of Therapy:  Heparin level 0.3-0.5 units/ml, no bolus Monitor platelets by anticoagulation protocol: Yes   Plan:  Inc heparin to 1300 units/hr 1100 heparin level  Abran Duke, PharmD, BCPS Clinical Pharmacist Phone: [phone]

## 2020-02-29 LAB — CBC
HCT: 27.9 % — ABNORMAL LOW (ref 36.0–46.0)
Hemoglobin: 9.1 g/dL — ABNORMAL LOW (ref 12.0–15.0)
MCH: 33.6 pg (ref 26.0–34.0)
MCHC: 32.6 g/dL (ref 30.0–36.0)
MCV: 103 fL — ABNORMAL HIGH (ref 80.0–100.0)
Platelets: 406 10*3/uL — ABNORMAL HIGH (ref 150–400)
RBC: 2.71 MIL/uL — ABNORMAL LOW (ref 3.87–5.11)
RDW: 19.1 % — ABNORMAL HIGH (ref 11.5–15.5)
WBC: 10.4 10*3/uL (ref 4.0–10.5)
nRBC: 0.7 % — ABNORMAL HIGH (ref 0.0–0.2)

## 2020-02-29 LAB — HEPARIN LEVEL (UNFRACTIONATED): Heparin Unfractionated: 0.42 [IU]/mL (ref 0.30–0.70)

## 2020-02-29 MED ORDER — APIXABAN 5 MG PO TABS
5.0000 mg | ORAL_TABLET | Freq: Two times a day (BID) | ORAL | Status: DC
  Administered 2020-02-29 – 2020-03-03 (×7): 5 mg via ORAL
  Filled 2020-02-29 (×7): qty 1

## 2020-02-29 MED ORDER — HYDROMORPHONE HCL 1 MG/ML IJ SOLN
1.0000 mg | Freq: Once | INTRAMUSCULAR | Status: AC
  Administered 2020-02-29: 1 mg via INTRAVENOUS
  Filled 2020-02-29: qty 1

## 2020-02-29 NOTE — Progress Notes (Signed)
Progress Note  6 Days Post-Op  Subjective: Patient reports one episode of emesis yesterday but felt it was related to getting all her pain meds at once when she got off schedule. She denies nausea this AM and is having bowel function. She felt like milk may have irritated her stomach a little yesterday at well. Abdomen is sore but patient reports she is starting to feel more pain in LUE now. Hopeful to get home soon but doesn't want to rush anything either. We discussed pathology from liver biopsy.   Objective: Vital signs in last 24 hours: Temp:  [98 F (36.7 C)-98.9 F (37.2 C)] 98.1 F (36.7 C) (11/25 0440) Pulse Rate:  [76-85] 78 (11/25 0440) Resp:  [16-17] 16 (11/25 0440) BP: (97-110)/(61-67) 97/61 (11/25 0440) SpO2:  [98 %-99 %] 99 % (11/25 0440) Last BM Date: 02/28/20  Intake/Output from previous day: 11/24 0701 - 11/25 0700 In: 740 [P.O.:640] Out: 1875 [Urine:1600; Drains:275] Intake/Output this shift: Total I/O In: 120 [P.O.:120] Out: 1 [Urine:1]  PE: General appearance: alert and cooperative GI: soft, appropriately tender; non-distended, +BS; no masses,  no organomegaly, Jp SS, staples are c/d/i there is no cellulitis or erythema around incision. MS: splint to LUE, L fingers WWP    Lab Results:  Recent Labs    02/28/20 0042 02/29/20 0047  WBC 11.3* 10.4  HGB 8.4* 9.1*  HCT 24.8* 27.9*  PLT 299 406*   BMET Recent Labs    02/26/20 1226 02/27/20 0126  NA 137 136  K 3.4* 3.8  CL 102 107  CO2 22 21*  GLUCOSE 78 93  BUN 5* <5*  CREATININE 0.68 0.68  CALCIUM 8.1* 7.9*   PT/INR No results for input(s): LABPROT, INR in the last 72 hours. CMP     Component Value Date/Time   NA 136 02/27/2020 0126   NA 139 01/17/2018 1355   K 3.8 02/27/2020 0126   CL 107 02/27/2020 0126   CO2 21 (L) 02/27/2020 0126   GLUCOSE 93 02/27/2020 0126   BUN <5 (L) 02/27/2020 0126   BUN 10 01/17/2018 1355   CREATININE 0.68 02/27/2020 0126   CREATININE 0.63 01/27/2017  1204   CALCIUM 7.9 (L) 02/27/2020 0126   PROT 4.2 (L) 02/27/2020 0126   PROT 6.1 01/17/2018 1355   ALBUMIN 2.1 (L) 02/27/2020 0126   ALBUMIN 3.8 01/17/2018 1355   AST 27 02/27/2020 0126   ALT 24 02/27/2020 0126   ALKPHOS 66 02/27/2020 0126   BILITOT 1.1 02/27/2020 0126   BILITOT 0.5 01/17/2018 1355   GFRNONAA >60 02/27/2020 0126   GFRNONAA 101 12/23/2016 1207   GFRAA >60 05/03/2019 0933   GFRAA 117 12/23/2016 1207   Lipase     Component Value Date/Time   LIPASE 29 02/27/2020 0126       Studies/Results: No results found.  Anti-infectives: Anti-infectives (From admission, onward)   Start     Dose/Rate Route Frequency Ordered Stop   02/23/20 1615  ciprofloxacin (CIPRO) IVPB 400 mg        400 mg 200 mL/hr over 60 Minutes Intravenous  Once 02/23/20 1608 02/23/20 1737       Assessment/Plan Fall  G5splenic laceration -s/pex lap, splenectomy, liver bxby Dr. Freida Busman 11/19. Surgical path shows steatosis and some fibrosis. continue mylicon and PO pepcid. Repeat CT abdomen/pelvis ordered 11/22 due to worsening ileus and ABL anemia, no bleeding was found but PVT was identified. Ileus improving, advance diet Portal vein thrombus - noted on CT 11/22, hep gtt  started 11/23 w/o bolus. Hep level 0.27 this AM (Goal 0.3-0.5). monitor for s/s bleeding. Dr. Bedelia Person spoke with oncologist on call regarding possibility of a chronic underlying hypercoagulable disorder and this is felt to be unlikely. Could consider follow up with hematologist Dr. Candise Che or Dr. Leonides Schanz after hospitalization. Transitioning to DOAC 11/25 Left impacted distal radius fracture -Hand c/s, Dr. Merlyn Lot, continue splint, may f/u as o/p for repeat X-rays and evaluation. Hx SVT- metoprolol 12.5 BID Hx Hypothyroidism- reports this is untreated,TFTs WNL PMH chronic liver disease, alcoholic hepatitis, elevated AMA and presumed PBC - followed by [facility] gastroenterology, reordered home ursodiol, continue MVI/B1/folate. LFTs  and lipase are WNL today. ABL anemia - stable, hgb 9.1. HR and BP WNL. No orthostatic sxs or CP/palpitations/dizziness at rest.   FEN -advance to soft diet, saline lock IV VTE -SCDs,transition to DOAC today  ID -splenectomy vaccines at discharge or 14d post-op, whichever comes first Foley-removed and voiding spontaneously Dispo -floor, post-op ileus improving so advancing diet, transitioning to DOAC, Possible discharge in the next 1-2 days  LOS: 6 days    Juliet Rude , [facility] Surgery 02/29/2020, 9:59 AM Please see Amion for pager number during day hours 7:00am-4:30pm

## 2020-02-29 NOTE — TOC Initial Note (Signed)
Transition of Care [facility]) - Initial/Assessment Note    Patient Details  Name: Kara Stewart MRN: [id number] Date of Birth: 19-Dec-1975  Transition of Care [facility]) CM/SW Contact:    Lockie Pares, RN Phone Number: 02/29/2020, 12:57 PM  Clinical Narrative:                  Assigned to community health and wellness for PCP Patient needs to call on Monday to establish primary care.        Patient Goals and CMS Choice        Expected Discharge Plan and Services                                                Prior Living Arrangements/Services                       Activities of Daily Living Home Assistive Devices/Equipment: Other (Comment) (raised toilet seats) ADL Screening (condition at time of admission) Patient's cognitive ability adequate to safely complete daily activities?: Yes Is the patient deaf or have difficulty hearing?: No Does the patient have difficulty seeing, even when wearing glasses/contacts?: No Does the patient have difficulty concentrating, remembering, or making decisions?: No Patient able to express need for assistance with ADLs?: Yes Does the patient have difficulty dressing or bathing?: No Independently performs ADLs?: Yes (appropriate for developmental age) Does the patient have difficulty walking or climbing stairs?: No Weakness of Legs: Left (per patient) Weakness of Arms/Hands: None  Permission Sought/Granted                  Emotional Assessment              Admission diagnosis:  Laceration of spleen, initial encounter [S36.039A] Closed fracture of distal end of left radius, unspecified fracture morphology, initial encounter [S52.502A] Splenic laceration [S36.039A] Patient Active Problem List   Diagnosis Date Noted  . Splenic laceration 02/23/2020  . Right sided abdominal pain 04/12/2019  . Abnormal LFTs 10/10/2018  . Unwanted fertility 09/12/2018  . 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  Chronic liver disease 11/16/2017  . History of marijuana use 01/20/2015  . Abnormal CT scan, colon 01/08/2015  . Anxiety 11/29/2014  . History of colitis 11/29/2014  . RUQ pain 08/27/2014  . Thyroid nodule 01/11/2014  . SVT (supraventricular tachycardia) (HCC) 09/07/2012  . History of alcohol use 07/21/2012  . Fatty liver 10/28/2011   PCP:  Aliene Beams, MD Pharmacy:   [facility] [phone] - [place], [place] - [address] SEC OF [address] [place] [address] Phone: [phone] Fax: [phone]     Social Determinants of Health (SDOH) Interventions    Readmission Risk Interventions No flowsheet data found.

## 2020-02-29 NOTE — Progress Notes (Signed)
Pt having trouble managing pain in left wrist. Pain today has moved from left abdominal area to right abdominal area and back to the left abdominal area.  Lidocaine patch on right abd per pt request.  JP drain has put 160 mL serosanguinous fluid today.  Pt ambulates well with platform walker.  Pt stated this afternoon that her continuous pulse-ox machine read O2 sat in the 70s. She put [employer] on and it returned to the 90s.  Pt anxiety has increased today since visit from significant other this morning.  Pt stated she was getting anxious again in the evening, but she turned down ativan.  Pt vomited once while significant other was here.  Notified MD of pain and nausea.

## 2020-02-29 NOTE — Progress Notes (Signed)
ANTICOAGULATION CONSULT NOTE - Initial Consult  Pharmacy Consult for apixaban Indication: portal vein thrombus 02/26/20  Allergies  Allergen Reactions  . Penicillins Anaphylaxis    Has patient had a PCN reaction causing immediate rash, facial/tongue/throat swelling, SOB or lightheadedness with hypotension: yes Has patient had a PCN reaction causing severe rash involving mucus membranes or skin necrosis: No Has patient had a PCN reaction that required hospitalization Yes Has patient had a PCN reaction occurring within the last 10 years: No If all of the above answers are "NO", then may proceed with Cephalosporin use.   . Lidocaine Other (See Comments)    Can use topical lidocaine but if ingested causes Hallucinations.    Patient Measurements: Height: 5\' 3"  (160 cm) Weight: 74.8 kg (164 lb 14.5 oz) IBW/kg (Calculated) : 52.4  Vital Signs: Temp: 98.1 F (36.7 C) (11/25 0440) Temp Source: Oral (11/25 0440) BP: 97/61 (11/25 0440) Pulse Rate: 78 (11/25 0440)  Labs: Recent Labs    02/26/20 1226 02/26/20 2045 02/27/20 0126 02/27/20 1811 02/28/20 0042 02/28/20 1108 02/29/20 0047  HGB 8.3*   < > 8.2*  --  8.4*  --  9.1*  HCT 24.7*   < > 24.5*  --  24.8*  --  27.9*  PLT 191   < > 228  --  299  --  406*  HEPARINUNFRC  --   --   --    < > 0.27* 0.43 0.42  CREATININE 0.68  --  0.68  --   --   --   --    < > = values in this interval not displayed.    Estimated Creatinine Clearance: 87.9 mL/min (by C-G formula based on SCr of 0.68 mg/dL).   Medical History: Past Medical History:  Diagnosis Date  . Acid reflux   . 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 Alcoholic hepatitis without ascites   . Anxiety   . Asthma   . Colitis   . GERD (gastroesophageal reflux disease) 07/21/2012  . Hiatal hernia   . History of alcohol abuse 04/26/2015  . Hypertriglyceridemia   . Hypothyroid   . Iritis    frequent  . Ovarian cyst   . Panic attacks   . SVT (supraventricular tachycardia) (HCC)     last issue 12/2017    Assessment: 36 yof presenting 11/19 s/p multiple falls with ruptured spleen, s/p ex-lap, splenectomy 11/19. Patient found to have acute portal vein thrombus 02/26/20. Pharmacy consulted to transition from heparin to apixaban this morning. Patient is not on anticoagulation PTA. Heparin levels have been stable and within goal. No signs/symptoms of bleeding this morning. Will transition to apixaban 5mg  BID.   Goal of Therapy:  Monitor platelets by anticoagulation protocol: Yes   Plan:  Discontinue heparin infusion Initiate apixaban 5mg  BID  02/28/20, PharmD PGY2 ID Pharmacy Resident Phone between 7 am - 3:30 pm:  Please check AMION for all [facility] Pharmacy phone numbers After 10:00 PM, call Main Pharmacy [phone]  02/29/2020,7:56 AM

## 2020-02-29 NOTE — Progress Notes (Signed)
ANTICOAGULATION CONSULT NOTE  Pharmacy Consult for heparin Indication: portal vein thrombus 02/26/20  Weight 74.8kg Height 5'3" Heparin Dosing Weight: 72 kg  Labs: Recent Labs    02/26/20 1226 02/26/20 2045 02/27/20 0126 02/27/20 1811 02/28/20 0042 02/28/20 1108 02/29/20 0047  HGB 8.3*   < > 8.2*  --  8.4*  --  9.1*  HCT 24.7*   < > 24.5*  --  24.8*  --  27.9*  PLT 191   < > 228  --  299  --  406*  HEPARINUNFRC  --   --   --    < > 0.27* 0.43 0.42  CREATININE 0.68  --  0.68  --   --   --   --    < > = values in this interval not displayed.    Assessment: 85 yof presenting 11/19 s/p multiple falls with ruptured spleen, s/p ex-lap, splenectomy 11/19. Patient found to have acute portal vein thrombus 02/26/20. Pharmacy consulted to dose heparin with no bolus and targeting lower goal. Patient is not on anticoagulation PTA.  Heparin level therapeutic at 0.42. CBC stable. No active bleed issues reported.  Goal of Therapy:  Heparin level 0.3-0.5 units/ml, no bolus Monitor platelets by anticoagulation protocol: Yes   Plan:  Continue heparin at 1300 units/hr Monitor daily heparin level and CBC, s/sx bleeding  Margarite Gouge, PharmD PGY2 ID Pharmacy Resident Phone between 7 am - 3:30 pm: [phone]  Please check AMION for all [facility] Pharmacy phone numbers After 10:00 PM, call Main Pharmacy [phone]  02/29/2020 7:23 AM

## 2020-03-01 ENCOUNTER — Inpatient Hospital Stay (HOSPITAL_COMMUNITY): Payer: Medicaid Other

## 2020-03-01 LAB — CBC
HCT: 25.4 % — ABNORMAL LOW (ref 36.0–46.0)
Hemoglobin: 8.3 g/dL — ABNORMAL LOW (ref 12.0–15.0)
MCH: 32.9 pg (ref 26.0–34.0)
MCHC: 32.7 g/dL (ref 30.0–36.0)
MCV: 100.8 fL — ABNORMAL HIGH (ref 80.0–100.0)
Platelets: 519 10*3/uL — ABNORMAL HIGH (ref 150–400)
RBC: 2.52 MIL/uL — ABNORMAL LOW (ref 3.87–5.11)
RDW: 19 % — ABNORMAL HIGH (ref 11.5–15.5)
WBC: 10.4 10*3/uL (ref 4.0–10.5)
nRBC: 0.4 % — ABNORMAL HIGH (ref 0.0–0.2)

## 2020-03-01 MED ORDER — SODIUM CHLORIDE 0.9 % IV BOLUS
1000.0000 mL | Freq: Once | INTRAVENOUS | Status: AC
  Administered 2020-03-01: 1000 mL via INTRAVENOUS

## 2020-03-01 MED ORDER — IBUPROFEN 400 MG PO TABS
400.0000 mg | ORAL_TABLET | Freq: Four times a day (QID) | ORAL | Status: DC | PRN
  Administered 2020-03-01 – 2020-03-02 (×2): 400 mg via ORAL
  Filled 2020-03-01 (×2): qty 1

## 2020-03-01 MED ORDER — METOPROLOL TARTRATE 12.5 MG HALF TABLET: 12.5000 mg | ORAL_TABLET | Freq: Two times a day (BID) | ORAL | Status: DC | PRN

## 2020-03-01 NOTE — Progress Notes (Signed)
Notified On call trauma Dr Janee Morn of low blood pressure, new orders given to bolus 1L of normal saline.

## 2020-03-01 NOTE — TOC Initial Note (Addendum)
Transition of Care [facility]) - Initial/Assessment Note    Patient Details  Name: Kara Stewart MRN: [id number] Date of Birth: January 19, 1976  Transition of Care [facility]) CM/SW Contact:    Glennon Mac, RN Phone Number: 03/01/2020, 1:59 PM  Clinical Narrative:  44 y.o. female with a history of prior alcohol abuse w/ alcohol related hepatitis and hypothyroidism who [facility] after a [facility] and found to have laceration and left impacted distal radius fracture. Pt found to have grade 5 spleen laceration, L impacted radius fx. 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 Prior to admission, patient independent and living at home with her 34-month-old child and significant other.  PT recommending home health, though patient prefers outpatient referral for therapies as she does not want outsiders coming into her home.  She has concerns for her small child's health and her own, now her spleen has been removed.  Will make referral to [facility] outpatient rehab center in [place], per patient request.  Referral to [facility] for cane, as recommended by therapist.  Patient's PCP was previously [facility], but her PCP moved to another location.  She states that she will go back to this practice.  Attempted to make appointment for patient at this practice, but they are closed for the Thanksgiving holiday.  Left message for appointment scheduler at [facility] primary care with patient contact information.  Patient made aware, and states she will follow-up with them if she does not hear from them after discharge.    Noted CM order regarding need for match for DOAC; patient is insured with [facility], and Eliquis is a preferred drug on [place] Medicaid formulary.  Patient will not need MATCH letter.            Expected Discharge Plan: OP Rehab Barriers to Discharge: Continued Medical Work up           Expected Discharge Plan and Services Expected Discharge Plan: OP  Rehab   Discharge Planning Services: CM Consult   Living arrangements for the past 2 months: Single Family Home                 DME Arranged: [facility] DME Agency: [facility] Date DME Agency Contacted: 03/01/20 Time DME Agency Contacted: [phone] Representative spoke with at DME Agency: Texted to [employer] Rep            Prior Living Arrangements/Services Living arrangements for the past 2 months: Single Family Home Lives with:: Minor Children, Spouse Patient language and need for interpreter reviewed:: Yes Do you feel safe going back to the place where you live?: Yes      Need for Family Participation in Patient Care: Yes (Comment) Care giver support system in place?: Yes (comment)   Criminal Activity/Legal Involvement Pertinent to Current Situation/Hospitalization: No - Comment as needed  Activities of Daily Living Home Assistive Devices/Equipment: Other (Comment) (raised toilet seats) ADL Screening (condition at time of admission) Patient's cognitive ability adequate to safely complete daily activities?: Yes Is the patient deaf or have difficulty hearing?: No Does the patient have difficulty seeing, even when wearing glasses/contacts?: No Does the patient have difficulty concentrating, remembering, or making decisions?: No Patient able to express need for assistance with ADLs?: Yes Does the patient have difficulty dressing or bathing?: No Independently performs ADLs?: Yes (appropriate for developmental age) Does the patient have difficulty walking or climbing stairs?: No Weakness of Legs: Left (per patient) Weakness of Arms/Hands: None  Permission Sought/Granted  Emotional Assessment Appearance:: Appears stated age Attitude/Demeanor/Rapport: Engaged Affect (typically observed): Accepting Orientation: : Oriented to Self, Oriented to Place, Oriented to  Time, Oriented to Situation      Admission diagnosis:  Laceration of spleen, initial encounter  [S36.039A] Closed fracture of distal end of left radius, unspecified fracture morphology, initial encounter [S52.502A] Splenic laceration [S36.039A] Patient Active Problem List   Diagnosis Date Noted  . Splenic laceration 02/23/2020  . Right sided abdominal pain 04/12/2019  . Abnormal LFTs 10/10/2018  . Unwanted fertility 09/12/2018  . 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 Chronic liver disease 11/16/2017  . History of marijuana use 01/20/2015  . Abnormal CT scan, colon 01/08/2015  . Anxiety 11/29/2014  . History of colitis 11/29/2014  . RUQ pain 08/27/2014  . Thyroid nodule 01/11/2014  . SVT (supraventricular tachycardia) (HCC) 09/07/2012  . History of alcohol use 07/21/2012  . Fatty liver 10/28/2011   PCP:  Aliene Beams, MD Pharmacy:   [facility] [phone] - [place], [place] - [address] [place] [address] Phone: [phone] Fax: [phone]     Social Determinants of Health (SDOH) Interventions    Readmission Risk Interventions No flowsheet data found.  Quintella Baton, RN, BSN  Trauma/Neuro ICU Case Manager [phone]

## 2020-03-01 NOTE — Progress Notes (Signed)
Progress Note  7 Days Post-Op  Subjective: Patient got behind with pain control again yesterday and had another episode of emesis. Also had an episode where oxygen saturations dropped to the 70's but improved with supplemental O2. Denies feeling SOB but has been using nasal cannula. She reports she is using IS and sometimes able to pull up to 1500 but not consistently yet. Did tolerate dinner overnight and is having bowel function.   Objective: Vital signs in last 24 hours: Temp:  [97.9 F (36.6 C)-98.5 F (36.9 C)] 98.5 F (36.9 C) (11/26 0542) Pulse Rate:  [67-89] 79 (11/26 0542) Resp:  [17-18] 18 (11/26 0542) BP: (93-114)/(51-71) 105/60 (11/26 0542) SpO2:  [100 %] 100 % (11/26 0542) Last BM Date: 02/28/20  Intake/Output from previous day: 11/25 0701 - 11/26 0700 In: 120 [P.O.:120] Out: 922 [Urine:601; Emesis/NG output:100; Drains:220; Stool:1] Intake/Output this shift: No intake/output data recorded.  PE: General appearance: alert and cooperative Chest: RRR, normal effort of breathing, lungs CTAB GI: soft, appropriately tender;non-distended, +BS; no masses, no organomegaly, Jp SS,staples are c/d/i there is no cellulitis or erythema around incision. MS: splint to LUE, L fingers WWP   Lab Results:  Recent Labs    02/29/20 0047 03/01/20 0223  WBC 10.4 10.4  HGB 9.1* 8.3*  HCT 27.9* 25.4*  PLT 406* 519*   BMET No results for input(s): NA, K, CL, CO2, GLUCOSE, BUN, CREATININE, CALCIUM in the last 72 hours. PT/INR No results for input(s): LABPROT, INR in the last 72 hours. CMP     Component Value Date/Time   NA 136 02/27/2020 0126   NA 139 01/17/2018 1355   K 3.8 02/27/2020 0126   CL 107 02/27/2020 0126   CO2 21 (L) 02/27/2020 0126   GLUCOSE 93 02/27/2020 0126   BUN <5 (L) 02/27/2020 0126   BUN 10 01/17/2018 1355   CREATININE 0.68 02/27/2020 0126   CREATININE 0.63 01/27/2017 1204   CALCIUM 7.9 (L) 02/27/2020 0126   PROT 4.2 (L) 02/27/2020 0126   PROT  6.1 01/17/2018 1355   ALBUMIN 2.1 (L) 02/27/2020 0126   ALBUMIN 3.8 01/17/2018 1355   AST 27 02/27/2020 0126   ALT 24 02/27/2020 0126   ALKPHOS 66 02/27/2020 0126   BILITOT 1.1 02/27/2020 0126   BILITOT 0.5 01/17/2018 1355   GFRNONAA >60 02/27/2020 0126   GFRNONAA 101 12/23/2016 1207   GFRAA >60 05/03/2019 0933   GFRAA 117 12/23/2016 1207   Lipase     Component Value Date/Time   LIPASE 29 02/27/2020 0126       Studies/Results: No results found.  Anti-infectives: Anti-infectives (From admission, onward)   Start     Dose/Rate Route Frequency Ordered Stop   02/23/20 1615  ciprofloxacin (CIPRO) IVPB 400 mg        400 mg 200 mL/hr over 60 Minutes Intravenous  Once 02/23/20 1608 02/23/20 1737       Assessment/Plan Fall  G5splenic laceration -s/pex lap, splenectomy, liver bxby Dr. Freida Busman 11/19. Surgical path shows steatosis and some fibrosis. continue mylicon and PO pepcid. Repeat CT abdomen/pelvis ordered 11/22 due to worsening ileus and ABL anemia, no bleeding was found but PVT was identified. Ileus resolved. 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 Hep level 0.27 this AM (Goal 0.3-0.5).monitor for s/s bleeding. Dr. Bedelia Person spoke with oncologist on call regarding possibility of a chronic underlying hypercoagulable disorder and this is felt to be unlikely. Could consider follow up with hematologist Dr. Candise Che or  Dr. Leonides Schanz after hospitalization. Transitioning to [facility] 11/25 Left impacted distal radius fracture -Hand c/s, Dr. Merlyn Lot, continue splint, may f/u as o/p for repeat X-rays and evaluation. Hx SVT- metoprolol 12.5 prn for tachycardia  Hx Hypothyroidism- reports this is untreated,TFTs WNL PMH chronic liver disease, alcoholic hepatitis, elevated AMA and presumed PBC - followed by [facility] gastroenterology, reordered home ursodiol, continue MVI/B1/folate. LFTs and lipase are WNL today. ABL anemia- hgb down to 8.3 from 9.1, pt got 1L  bolus yesterday for some hypotension, recheck CBC in AM  FEN -soft diet,saline lock IV VTE -SCDs,eliquis ID -splenectomy vaccines at discharge or 14d post-op, whichever comes first Foley-removed and voiding spontaneously Dispo -CXR today for SOB, wean supplemental O2 as able. Monitor CBC and BP. Possible discharge over the weekend if BP and H/H stable  LOS: 7 days    Juliet Rude , [facility] 03/01/2020, 8:46 AM Please see Amion for pager number during day hours 7:00am-4:30pm

## 2020-03-01 NOTE — Progress Notes (Signed)
Physical Therapy Treatment Patient Details Name: Kara Stewart MRN: [id number] DOB: July 09, 1975 Today's Date: 03/01/2020    History of Present Illness 44 y.o. female with a history of prior alcohol abuse w/ alcohol related hepatitis and hypothyroidism who was transferred from [facility] after a fall and found to have laceration and left impacted distal radius fracture. Pt found to have grade 5 spleen laceration, L impacted radius fx. 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    PT Comments    Continuing work on functional mobility and activity tolerance;  Notable improvement in pain management to day compared to last session; Orthostatic BPs obtained due to dizziness, and they were negative for a drop; Able to walk the hallways overall well with straight cane; On track for dc home soon   Follow Up Recommendations  Outpatient PT;Supervision - Intermittent     Equipment Recommendations  Cane (in room)    Recommendations for Other Services       Precautions / Restrictions Precautions Precautions: Fall Required Braces or Orthoses: Splint/Cast Splint/Cast: L wrist splint Restrictions LUE Weight Bearing: Non weight bearing    Mobility  Bed Mobility Overal bed mobility: Independent Bed Mobility: Supine to Sit     Supine to sit: Modified independent (Device/Increase time)     General bed mobility comments: Overall managing well  Transfers Overall transfer level: Needs assistance Equipment used: Straight cane Transfers: Sit to/from Stand Sit to Stand: Supervision         General transfer comment: Cues to self-monitor for activity tolerance  Ambulation/Gait Ambulation/Gait assistance: Min guard;Supervision Gait Distance (Feet): 110 Feet Assistive device: Straight cane Gait Pattern/deviations: Step-through pattern     General Gait Details: Good use of straight cane for stability and balance; cues to self-monitor for activity tolerance   Stairs              Wheelchair Mobility    Modified Rankin (Stroke Patients Only)       Balance     Sitting balance-Leahy Scale: Good       Standing balance-Leahy Scale: Fair                              Cognition Arousal/Alertness: Awake/alert Behavior During Therapy: WFL for tasks assessed/performed Overall Cognitive Status: Within Functional Limits for tasks assessed                                        Exercises      General Comments General comments (skin integrity, edema, etc.): VSS; Orthostatics negative for BP drop with activity; See vitals flow sheet.       Pertinent Vitals/Pain Pain Assessment: Faces Faces Pain Scale: Hurts little more Pain Location: abdomen and L wrist Pain Descriptors / Indicators: Sore Pain Intervention(s): Monitored during session    Home Living                      Prior Function            PT Goals (current goals can now be found in the care plan section) Acute Rehab PT Goals Patient Stated Goal: pain to decrease PT Goal Formulation: With patient Time For Goal Achievement: 03/09/20 Potential to Achieve Goals: Good Progress towards PT goals: Progressing toward goals    Frequency    Min 5X/week      PT Plan Discharge  plan needs to be updated    Co-evaluation              AM-PAC PT "6 Clicks" Mobility   Outcome Measure  Help needed turning from your back to your side while in a flat bed without using bedrails?: None Help needed moving from lying on your back to sitting on the side of a flat bed without using bedrails?: None Help needed moving to and from a bed to a chair (including a wheelchair)?: A Little Help needed standing up from a chair using your arms (e.g., wheelchair or bedside chair)?: A Little Help needed to walk in hospital room?: A Little Help needed climbing 3-5 steps with a railing? : A Little 6 Click Score: 20    End of Session   Activity Tolerance: Patient  tolerated treatment well Patient left: in chair;with call bell/phone within reach Nurse Communication: Mobility status PT Visit Diagnosis: Other abnormalities of gait and mobility (R26.89);Pain Pain - Right/Left: Left Pain - part of body: Hand (abdomen)     Time: 1497-0263 PT Time Calculation (min) (ACUTE ONLY): 22 min  Charges:  $Gait Training: 8-22 mins                     Van Clines, PT  Acute Rehabilitation Services Pager [phone] Office [phone]    Kara Stewart 03/01/2020, 4:47 PM

## 2020-03-02 LAB — CBC
HCT: 29.7 % — ABNORMAL LOW (ref 36.0–46.0)
Hemoglobin: 9.6 g/dL — ABNORMAL LOW (ref 12.0–15.0)
MCH: 33.1 pg (ref 26.0–34.0)
MCHC: 32.3 g/dL (ref 30.0–36.0)
MCV: 102.4 fL — ABNORMAL HIGH (ref 80.0–100.0)
Platelets: 738 10*3/uL — ABNORMAL HIGH (ref 150–400)
RBC: 2.9 MIL/uL — ABNORMAL LOW (ref 3.87–5.11)
RDW: 19.1 % — ABNORMAL HIGH (ref 11.5–15.5)
WBC: 10.9 10*3/uL — ABNORMAL HIGH (ref 4.0–10.5)
nRBC: 0.2 % (ref 0.0–0.2)

## 2020-03-02 MED ORDER — HYDROMORPHONE HCL 1 MG/ML IJ SOLN: 1.0000 mg | INTRAMUSCULAR | Status: DC | PRN

## 2020-03-02 MED ORDER — OXYCODONE HCL 5 MG PO TABS
5.0000 mg | ORAL_TABLET | ORAL | Status: DC | PRN
  Administered 2020-03-02 – 2020-03-03 (×6): 10 mg via ORAL
  Filled 2020-03-02 (×6): qty 2

## 2020-03-02 NOTE — Progress Notes (Signed)
Progress Note  8 Days Post-Op  Subjective: Had some pain this AM because didn't have time to get pain meds before getting up to the bathroom. Had a more solid, non-bloody stool this AM. Tolerating PO. Pain overall controlled when she doesn't get behind on her oral pain meds. Mobilizing and feeling more steady while walking. Appetite improving.  Objective: Vital signs in last 24 hours: Temp:  [98.1 F (36.7 C)-98.4 F (36.9 C)] 98.1 F (36.7 C) (11/27 0359) Pulse Rate:  [78-86] 83 (11/27 0359) Resp:  [18-19] 18 (11/27 0359) BP: (105-109)/(62-63) 108/62 (11/27 0359) SpO2:  [100 %] 100 % (11/27 0359) Last BM Date: 03/01/20  Intake/Output from previous day: 11/26 0701 - 11/27 0700 In: -  Out: 760 [Urine:700; Drains:60] Intake/Output this shift: No intake/output data recorded.  PE: General appearance: alert and cooperative Chest: RRR, normal effort of breathing, lungs CTAB GI: soft, appropriately tender;non-distended, +BS; no masses, no organomegaly, Jp SS,staples are c/d/i there is no cellulitis or erythema around incision. MS: splint to LUE, L fingers WWP   Lab Results:  Recent Labs    03/01/20 0223 03/02/20 0132  WBC 10.4 10.9*  HGB 8.3* 9.6*  HCT 25.4* 29.7*  PLT 519* 738*   BMET No results for input(s): NA, K, CL, CO2, GLUCOSE, BUN, CREATININE, CALCIUM in the last 72 hours. PT/INR No results for input(s): LABPROT, INR in the last 72 hours. CMP     Component Value Date/Time   NA 136 02/27/2020 0126   NA 139 01/17/2018 1355   K 3.8 02/27/2020 0126   CL 107 02/27/2020 0126   CO2 21 (L) 02/27/2020 0126   GLUCOSE 93 02/27/2020 0126   BUN <5 (L) 02/27/2020 0126   BUN 10 01/17/2018 1355   CREATININE 0.68 02/27/2020 0126   CREATININE 0.63 01/27/2017 1204   CALCIUM 7.9 (L) 02/27/2020 0126   PROT 4.2 (L) 02/27/2020 0126   PROT 6.1 01/17/2018 1355   ALBUMIN 2.1 (L) 02/27/2020 0126   ALBUMIN 3.8 01/17/2018 1355   AST 27 02/27/2020 0126   ALT 24 02/27/2020  0126   ALKPHOS 66 02/27/2020 0126   BILITOT 1.1 02/27/2020 0126   BILITOT 0.5 01/17/2018 1355   GFRNONAA >60 02/27/2020 0126   GFRNONAA 101 12/23/2016 1207   GFRAA >60 05/03/2019 0933   GFRAA 117 12/23/2016 1207   Lipase     Component Value Date/Time   LIPASE 29 02/27/2020 0126       Studies/Results: DG CHEST PORT 1 VIEW  Result Date: 03/01/2020 CLINICAL DATA:  Shortness of breath. EXAM: PORTABLE CHEST 1 VIEW COMPARISON:  February 19, 2016. FINDINGS: The heart size and mediastinal contours are within normal limits. Both lungs are clear. No visible pleural effusions or pneumothorax. The visualized skeletal structures are unremarkable. IMPRESSION: No active disease. Electronically Signed   By: Feliberto Harts MD   On: 03/01/2020 09:10    Anti-infectives: Anti-infectives (From admission, onward)   Start     Dose/Rate Route Frequency Ordered Stop   02/23/20 1615  ciprofloxacin (CIPRO) IVPB 400 mg        400 mg 200 mL/hr over 60 Minutes Intravenous  Once 02/23/20 1608 02/23/20 1737       Assessment/Plan Fall  G5splenic laceration -s/pex lap, splenectomy, liver bxby Dr. Freida Busman 11/19. Surgical path shows steatosis and some fibrosis. continue mylicon and PO pepcid. Repeat CT abdomen/pelvis ordered 11/22 due to worsening ileus and ABL anemia, no bleeding was found but PVT was identified. Ileus resolved. Portal vein thrombus-  noted on CT 11/22, hep gtt started 11/23 w/obolus. Dr. Bedelia Person spoke with oncologist on call regarding possibility of a chronic underlying hypercoagulable disorder and this is felt to be unlikely. Could consider follow up with hematologist Dr. Candise Che or Dr. Leonides Schanz after hospitalization. Transitioned to [facility] 11/25  Left impacted distal radius fracture -Hand c/s, Dr. Merlyn Lot, continue splint, may f/u as o/p for repeat X-rays and evaluation. Hx SVT- metoprolol 12.5 prn for tachycardia  Hx Hypothyroidism- reports this is untreated,TFTs WNL PMH chronic liver  disease, alcoholic hepatitis, elevated AMA and presumed PBC - followed by [facility] gastroenterology, reordered home ursodiol, continue MVI/B1/folate. LFTs and lipase are WNL today. ABL anemia- hgb down to 9.6 from 8.3, stable/improving  FEN -soft diet, VTE -SCDs,eliquis ID -splenectomy vaccines at discharge or 14d post-op, whichever comes first Foley-removed and voiding spontaneously Dispo -wean supplemental O2 as able. Anticipate hospital discharge tomorrow if O2 is off.   LOS: 8 days    Adam Phenix , [facility] Surgery 03/02/2020, 10:07 AM Please see Amion for pager number during day hours 7:00am-4:30pm

## 2020-03-02 NOTE — Progress Notes (Signed)
Physical Therapy Treatment Patient Details Name: Kara Stewart MRN: [id number] DOB: 11/27/1975 Today's Date: 03/02/2020    History of Present Illness 44 y.o. female with a history of prior alcohol abuse w/ alcohol related hepatitis and hypothyroidism who was transferred from [facility] after a fall and found to have laceration and left impacted distal radius fracture. Pt found to have grade 5 spleen laceration, L impacted radius fx. 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  Currently modified indep for all bed mobility, transfers and gait. Pt still c/o some dizziness when first OOB - we discussed to go slow and sit EOB for ~1 min before standing.  No further acute care PT needs.   I do recommend F/U at OP PT as needed if not recovery as expected.  Will sign off for now.      Follow Up Recommendations  Outpatient PT;Supervision - Intermittent     Equipment Recommendations  Cane    Recommendations for Other Services       Precautions / Restrictions Precautions Precautions: Fall Splint/Cast: L wrist splint Restrictions Weight Bearing Restrictions: Yes LUE Weight Bearing: Non weight bearing    Mobility  Bed Mobility Overal bed mobility: Independent                Transfers Overall transfer level: Modified independent Equipment used: Straight cane                Ambulation/Gait Ambulation/Gait assistance: Modified independent (Device/Increase time) Gait Distance (Feet): 200 Feet Assistive device: Straight cane Gait Pattern/deviations: Step-through pattern Gait velocity: reduced   General Gait Details: ambulating in room without assistive device with no appparent balance concerns; prefers cane for ambulating outside of room.   Stairs Stairs: Yes Stairs assistance: Modified independent (Device/Increase time) Stair Management: One rail Right;Forwards;Alternating pattern Number of Stairs:  10     Wheelchair Mobility    Modified Rankin (Stroke Patients Only)       Balance   Sitting-balance support: No upper extremity supported;Feet supported Sitting balance-Leahy Scale: Good Sitting balance - Comments: more limited by abdominal pain than balance deficits   Standing balance support: No upper extremity supported;During functional activity Standing balance-Leahy Scale: Good                              Cognition Arousal/Alertness: Awake/alert Behavior During Therapy: WFL for tasks assessed/performed Overall Cognitive Status: Within Functional Limits for tasks assessed                                        Exercises General Exercises - Lower Extremity Ankle Circles/Pumps: AROM;Both;10 reps;Supine Quad Sets: AROM;Both;10 reps;Supine Gluteal Sets: AROM;Both;10 reps;Supine Other Exercises Other Exercises: Pelvic Tilt, 10 reps supine    General Comments  Answered patients questions regarding progression (increase slowly) and limitations with recent surgery (referred to MD for full list, but discussed no lifting, no driving).        Pertinent Vitals/Pain Pain Assessment: 0-10 Pain Score: 3  Pain Location: abdomen and L wrist Pain Descriptors / Indicators: Guarding;Discomfort;Operative site guarding Pain Intervention(s): Limited activity within patient's tolerance;Monitored during session    Home Living                      Prior Function  PT Goals (current goals can now be found in the care plan section) Progress towards PT goals: Goals met/education completed, patient discharged from PT    Frequency           PT Plan Current plan remains appropriate    Co-evaluation              AM-PAC PT "6 Clicks" Mobility   Outcome Measure  Help needed turning from your back to your side while in a flat bed without using bedrails?: None Help needed moving from lying on your back to sitting on the  side of a flat bed without using bedrails?: None Help needed moving to and from a bed to a chair (including a wheelchair)?: None Help needed standing up from a chair using your arms (e.g., wheelchair or bedside chair)?: None Help needed to walk in hospital room?: None Help needed climbing 3-5 steps with a railing? : None 6 Click Score: 24    End of Session   Activity Tolerance: Patient tolerated treatment well Patient left: in bed;with call bell/phone within reach   PT Visit Diagnosis: Other abnormalities of gait and mobility (R26.89);Pain Pain - Right/Left: Left Pain - part of body: Hand     Time: 1345-1415 PT Time Calculation (min) (ACUTE ONLY): 30 min  Charges:  $Gait Training: 8-22 mins $Therapeutic Exercise: 8-22 mins                     03/02/2020 Margie, PT Acute Rehabilitation Services Pager:  [phone] Office:  [place] 03/02/2020, 2:33 PM

## 2020-03-02 NOTE — Progress Notes (Signed)
SATURATION QUALIFICATIONS: (This note is used to comply with regulatory documentation for home oxygen)  Patient Saturations on Room Air at Rest = 100%  Patient Saturations on Room Air while Ambulating = Refused to ambulate.says will do with PT

## 2020-03-03 LAB — CBC
HCT: 27.3 % — ABNORMAL LOW (ref 36.0–46.0)
Hemoglobin: 8.6 g/dL — ABNORMAL LOW (ref 12.0–15.0)
MCH: 32.6 pg (ref 26.0–34.0)
MCHC: 31.5 g/dL (ref 30.0–36.0)
MCV: 103.4 fL — ABNORMAL HIGH (ref 80.0–100.0)
Platelets: 824 10*3/uL — ABNORMAL HIGH (ref 150–400)
RBC: 2.64 MIL/uL — ABNORMAL LOW (ref 3.87–5.11)
RDW: 19.2 % — ABNORMAL HIGH (ref 11.5–15.5)
WBC: 9.3 10*3/uL (ref 4.0–10.5)
nRBC: 0 % (ref 0.0–0.2)

## 2020-03-03 MED ORDER — PNEUMOCOCCAL 13-VAL CONJ VACC IM SUSP: 0.5000 mL | Freq: Once | INTRAMUSCULAR | 0 refills | Status: AC

## 2020-03-03 MED ORDER — MENINGOCOCCAL A C Y&W-135 OLIG IM SOLR: 0.5000 mL | Freq: Once | INTRAMUSCULAR | 0 refills | Status: AC

## 2020-03-03 MED ORDER — APIXABAN 5 MG PO TABS
5.0000 mg | ORAL_TABLET | Freq: Two times a day (BID) | ORAL | 0 refills | Status: DC
Start: 2020-03-03 — End: 2020-04-04

## 2020-03-03 MED ORDER — METHOCARBAMOL 500 MG PO TABS: 1000.0000 mg | ORAL_TABLET | Freq: Four times a day (QID) | ORAL | 1 refills | Status: DC | PRN

## 2020-03-03 MED ORDER — ACETAMINOPHEN 325 MG PO TABS
650.0000 mg | ORAL_TABLET | Freq: Four times a day (QID) | ORAL | Status: DC | PRN
Start: 2020-03-03 — End: 2021-04-16

## 2020-03-03 MED ORDER — HAEMOPHILUS B POLYSAC CONJ VAC IM SOLR: 0.5000 mL | Freq: Once | INTRAMUSCULAR | 0 refills | Status: AC

## 2020-03-03 MED ORDER — OXYCODONE HCL 5 MG PO TABS: 10.0000 mg | ORAL_TABLET | Freq: Four times a day (QID) | ORAL | 0 refills | Status: AC | PRN

## 2020-03-03 MED ORDER — IBUPROFEN 400 MG PO TABS
400.0000 mg | ORAL_TABLET | Freq: Four times a day (QID) | ORAL | 0 refills | Status: DC | PRN
Start: 2020-03-03 — End: 2020-03-06

## 2020-03-03 MED ORDER — ONDANSETRON 4 MG PO TBDP
4.0000 mg | ORAL_TABLET | Freq: Three times a day (TID) | ORAL | 0 refills | Status: DC | PRN
Start: 2020-03-03 — End: 2021-04-16

## 2020-03-03 NOTE — Progress Notes (Signed)
Occupational Therapy Treatment Patient Details Name: Kara Stewart MRN: [id number] DOB: [birth year] Today's Date: 03/03/2020    History of present illness 44 y.o. female with a history of prior alcohol abuse w/ alcohol related hepatitis and hypothyroidism who was transferred from [facility] after a fall and found to have laceration and left impacted distal radius fracture. Pt found to have grade 5 spleen laceration, L impacted radius fx. 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 Pt reports she has a 35 mo old baby at home, and is concerned how to care for baby with 10-15# lifting restriction from abdominal surgery and NWB with LUE. Discussed safe ways pt can play and interact with child without breaking precautions, she understands she will need assistance with her daughters care giving. Pt has removable L wrist splint on and is educated on how to don/doff, wash, and wearing schedule. Pt understands she is NWB with that UE and which ADLs this may apply to. Helped brainstorm typical ADL routine tasks and appropriate compensatory techniques with pt in understanding. Recommend pt have OP OT after following up with orthopedist for surgical vs non surgical plan. Will continue to follow as acutely admitted.    Follow Up Recommendations  Follow surgeon's recommendation for DC plan and follow-up therapies;Outpatient OT    Equipment Recommendations  None recommended by OT    Recommendations for Other Services      Precautions / Restrictions Precautions Precautions: Fall Required Braces or Orthoses: Splint/Cast Splint/Cast: L wrist splint Restrictions Weight Bearing Restrictions: Yes LUE Weight Bearing: Non weight bearing       Mobility Bed Mobility Overal bed mobility: Independent                Transfers Overall transfer level: Modified independent                    Balance                                            ADL either performed or assessed with clinical judgement   ADL Overall ADL's : Needs assistance/impaired Eating/Feeding: Set up;Sitting   Grooming: Supervision/safety;Standing   Upper Body Bathing: Minimal assistance;Sitting   Lower Body Bathing: Minimal assistance;Sitting/lateral leans;Sit to/from stand       Lower Body Dressing: Minimal assistance;Sitting/lateral leans;Sit to/from stand Lower Body Dressing Details (indicate cue type and reason): reviewed compensatory strategies with L hand limited               General ADL Comments: pt reporting she has a 19 mo baby at home. Reviewed safe caregiver techniques given her current precautions and safe ADL compensatory techniques with LUE NWB     Vision Patient Visual Report: No change from baseline     Perception     Praxis      Cognition Arousal/Alertness: Awake/alert Behavior During Therapy: WFL for tasks assessed/performed Overall Cognitive Status: Impaired/Different from baseline Area of Impairment: Orientation                 Orientation Level: Time             General Comments: not realizing how many days she had been in hospital        Exercises Other Exercises Other Exercises: LUE: light digit mobilization per  pain tolerance   Shoulder Instructions       General Comments      Pertinent Vitals/ Pain       Pain Assessment: Faces Faces Pain Scale: Hurts little more Pain Location: L wrist Pain Descriptors / Indicators: Aching;Sore;Throbbing Pain Intervention(s): Limited activity within patient's tolerance;Monitored during session;Repositioned  Home Living Family/patient expects to be discharged to:: Private residence Living Arrangements: Spouse/significant other;Children                                      Prior Functioning/Environment              Frequency           Progress Toward Goals  OT Goals(current  goals can now be found in the care plan section)  Progress towards OT goals: Progressing toward goals  Acute Rehab OT Goals Patient Stated Goal: pain to decrease OT Goal Formulation: With patient Time For Goal Achievement: 03/12/20 Potential to Achieve Goals: Good  Plan Discharge plan needs to be updated    Co-evaluation                 AM-PAC OT "6 Clicks" Daily Activity     Outcome Measure   Help from another person eating meals?: None Help from another person taking care of personal grooming?: A Little Help from another person toileting, which includes using toliet, bedpan, or urinal?: A Little Help from another person bathing (including washing, rinsing, drying)?: A Little Help from another person to put on and taking off regular upper body clothing?: A Little Help from another person to put on and taking off regular lower body clothing?: A Little 6 Click Score: 19    End of Session    OT Visit Diagnosis: Unsteadiness on feet (R26.81);Muscle weakness (generalized) (M62.81);Pain Pain - Right/Left: Left Pain - part of body: Arm   Activity Tolerance Patient tolerated treatment well   Patient Left in bed;with call bell/phone within reach   Nurse Communication Mobility status        Time: 3716-9678 OT Time Calculation (min): 37 min  Charges: OT General Charges $OT Visit: 1 Visit OT Treatments $Self Care/Home Management : 23-37 mins  Kara Stewart, MSOT, OTR/L Acute Rehabilitation Services [facility] Office Number: [phone] Pager: [phone]  Kara Stewart 03/03/2020, 12:02 PM

## 2020-03-03 NOTE — Discharge Instructions (Signed)
[facility]      [facility] [phone]  OPEN ABDOMINAL SURGERY: POST OP INSTRUCTIONS  Always review your discharge instruction sheet given to you by the facility where your surgery was performed.  IF YOU HAVE DISABILITY OR FAMILY LEAVE FORMS, YOU MUST BRING THEM TO THE OFFICE FOR PROCESSING.  PLEASE DO NOT GIVE THEM TO YOUR DOCTOR.  1. A prescription for pain medication may be given to you upon discharge.  Take your pain medication as prescribed, if needed.  If narcotic pain medicine is not needed, then you may take acetaminophen (Tylenol) or ibuprofen (Advil) as needed. 2. Take your usually prescribed medications unless otherwise directed. 3. If you need a refill on your pain medication, please contact your pharmacy. They will contact our office to request authorization.  Prescriptions will not be filled after 5pm or on week-ends. 4. You should follow a light diet the first few days after arrival home, such as soup and crackers, pudding, etc.unless your doctor has advised otherwise. A high-fiber, low fat diet can be resumed as tolerated.   Be sure to include lots of fluids daily. Most patients will experience some swelling and bruising on the chest and neck area.  Ice packs will help.  Swelling and bruising can take several days to resolve 5. Most patients will experience some swelling and bruising in the area of the incision. Ice pack will help. Swelling and bruising can take several days to resolve..  6. It is common to experience some constipation if taking pain medication after surgery.  Increasing fluid intake and taking a stool softener will usually help or prevent this problem from occurring.  A mild laxative (Milk of Magnesia or Miralax) should be taken according to package directions if there are no bowel movements after 48 hours. 7.  You may have steri-strips (small skin tapes) in place directly over the incision.  These strips should be left on the skin for 7-10 days.  If your  surgeon used skin glue on the incision, you may shower in 24 hours.  The glue will flake off over the next 2-3 weeks.  Any sutures or staples will be removed at the office during your follow-up visit. You may find that a light gauze bandage over your incision may keep your staples from being rubbed or pulled. You may shower and replace the bandage daily. 8. ACTIVITIES:  You may resume regular (light) daily activities beginning the next day--such as daily self-care, walking, climbing stairs--gradually increasing activities as tolerated.  You may have sexual intercourse when it is comfortable.  Refrain from any heavy lifting or straining until approved by your doctor. a. You may drive when you no longer are taking prescription pain medication, you can comfortably wear a seatbelt, and you can safely maneuver your car and apply brakes  9. You should see your doctor in the office for a follow-up appointment approximately two weeks after your surgery.  Make sure that you call for this appointment within a day or two after you arrive home to insure a convenient appointment time.  WHEN TO CALL YOUR DOCTOR: 1. Fever over 101.0 2. Inability to urinate 3. Nausea and/or vomiting 4. Extreme swelling or bruising 5. Continued bleeding from incision. 6. Increased pain, redness, or drainage from the incision. 7. Difficulty swallowing or breathing 8. Muscle cramping or spasms. 9. Numbness or tingling in hands or feet or around lips.  The clinic staff is available to answer your questions during regular business hours.  Please   don't hesitate to call and ask to speak to one of the nurses if you have concerns.  For further questions, please visit [email or web address]   Long-Term Care After a Splenectomy A splenectomy is surgery to remove a diseased or injured spleen. The spleen is an organ that is located in the upper left part of the abdomen, just under the ribs. The spleen filters and cleans the blood.  It also stores blood cells and destroys cells that are old. The spleen, along with other body systems and organs, plays an important role in the body's natural disease-fighting system (immune system). Not having a spleen may affect your body's ability to fight infections. After the spleen is removed, you have a slightly greater chance of developing a serious, life-threatening infection. The following are some actions that you can take to prevent infection. How can I prevent infection? Your health care provider will recommend actions to help prevent infection. These may include:  Making sure that your immunizations are up to date, including: ? Pneumococcus. ? Seasonal flu (influenza). ? Hib (Haemophilus influenzae type b). ? Meningitis.  Making sure that vaccines are up to date for your family members.  Following good daily practices to prevent infection, such as: ? Washing your hands often, especially after preparing food, eating, changing diapers, and playing with children or animals. ? Disinfecting surfaces regularly. ? Avoiding people who have active illness or infections.  Taking precautions to avoid insect bites, such as: ? Wearing proper clothing that covers the entire body when you are in wooded or marshy areas. ? Changing clothing right away and checking for bites after you have been outside. ? Using insect spray. ? Using insect netting. ? Staying indoors during hours when mosquitoes are most active.  Taking precautions to avoid dog bites. ? After a splenectomy, you may be at increased risk for rare infections that are associated with dog bites. What do I need to do if I must travel? If you travel in the [place], take actions to avoid insect bites, especially in [place] and [place] coastal areas. Insects can carry many viruses, and you may be at an increased risk of becoming sick from these viruses. You should also take precautions if you travel abroad to places where  malaria is common. In that case, follow these guidelines:  Contact your health care provider to get specific advice about the places that you will be visiting.  Get specific immunizations to guard against the disease risks in the country that you will be visiting.  Understand how to prevent infections, such as malaria, while you are abroad. These infections can pose serious risk. Precautions may include: ? Daily tablets to prevent malaria. ? Taking other precautions to prevent insect bites.  Bring broad-spectrum antibiotic medicines with you if they have been prescribed. What other things do I need to remember to do?   Take over-the-counter and prescription medicines only as told by your health care provider.  If you were prescribed an antibiotic medicine: ? Take it as told by your health care provider. ? Do not stop taking the antibiotic even if you start to feel better. ? Talk with your health care provider about using a probiotic supplement to prevent stomach upset.  Keep track of medicine refills so you do not run out of medicine.  Always tell your health care providers that you do not have a spleen before you have any procedures. These include medical and dental procedures.  Inform your close contacts of your condition.  Consider wearing a medical alert bracelet or carrying an ID card.  Keep all follow-up visits as told by your health care provider. This is important. Contact a health care provider if:  You have a fever.  You have signs of infection that continue after taking an antibiotic. Signs may include a fever, chills, and feeling unwell.  You are considering travel abroad.  You are bitten by a tick or a dog. Get help right away if:  You have chest pain along with: ? Shortness of breath. ? Pain in the back, neck, or jaw.  You have pain or swelling in the leg.  You develop a sudden headache and dizziness. Summary  The spleen plays an important role in fighting  disease and infections. If you have had your spleen surgically removed, you should take steps to help prevent infections.  Make sure that your vaccinations are up to date.  Follow good daily practices to prevent infection, such as washing your hands often and avoiding people who are sick.  Make sure that your family members, others who are close to you, and all of your health care providers know that your spleen has been removed.  Contact a health care provider if you have a fever or any signs of infection. This information is not intended to replace advice given to you by your health care provider. Make sure you discuss any questions you have with your health care provider. Document Revised: 01/20/2018 Document Reviewed: 01/20/2018 Elsevier Patient Education  2020 [employer]   Wrist Splint, Adult A wrist splint holds your wrist in a position so it does not move. A splint supports your wrist like a cast, but it is not stiff like a cast (is flexible). You can take it off or make it looser. You may need a wrist splint if you hurt your wrist or have swelling in your wrist. A splint can:  Support your wrist.  Protect your wrist when it is hurt (injured).  Help you not hurt your wrist again.  Make your wrist stay still and not move.  Lessen pain.  Help your wrist heal. It is important to wear your splint as told by your doctor. This helps you make sure that your wrist heals the right way. What are the risks? If you wear your splint too tight or you have a lot of swelling, blood may not be able to go to your wrist or hand. If this happens, you can get a condition called compartment syndrome. It can be dangerous and cause damage that lasts. Symptoms are:  Pain in your wrist that gets worse.  Tingling.  Having no feeling in your wrist or hand. This is called numbness.  Changes in skin color. The skin may look very light (pale) or kind of blue.  Cold fingers. Other risks of  wearing a splint may be:  A stiff wrist.  A weak wrist.  Skin irritation that can cause: ? Itching. ? Rash. ? Sores. ? Infection. How to use your wrist splint Your wrist splint should be tight enough to support your wrist. It should not block blood from going to your hand or wrist. Your doctor will tell you how to wear your wrist splint and how long to wear it. Splint wear  Wear the splint as told by your doctor. Only take it off as told by your doctor.  Loosen the splint if your fingers tingle, get numb, or turn cold and blue.  Keep the splint clean.  If the splint is not waterproof: ? Do not let it get wet. ? Cover it with a watertight covering when you take a bath or a shower  Do not stick anything inside the splint to scratch your skin. Doing that increases your risk of infection.  Check the skin under the splint every time you take it off. Check for any redness or blisters. Tell your doctor about any skin problems. Managing pain, stiffness, and swelling   If directed, put ice on the injured area. ? If you a have a splint that can be taken off, take it off as told by your doctor. ? Put ice in a plastic bag. ? Place a towel between your skin and the bag. ? Leave the ice on for 20 minutes, 2-3 times a day.  Move your fingers often to avoid stiffness and to lessen swelling.  Raise (elevate) the injured area above the level of your heart while you are sitting or lying down. Activity  Return to your normal activities as told by your doctor. Ask your doctor what activities are safe for you.  Do exercises as told by your doctor.  Ask your doctor when it is safe to drive with a splint on your wrist. General instructions  Do not use the injured limb to support (bear) your body weight until your doctor says that you can.  Do not put pressure on any part of the splint until it is fully hardened. This may take many hours.  Do not use any products that have nicotine or  tobacco in them, such as cigarettes and e-cigarettes. If you need help quitting, ask your doctor.  Take over-the-counter and prescription medicines only as told by your doctor.  Keep all follow-up visits as told by your doctor. This is important. Get help if:  You have wrist pain or swelling that does not go away.  The skin around or under your splint gets red, itchy, or moist.  You have chills or fever.  Your splint feels too tight or too loose.  Your splint breaks. Get help right away if:  You have pain that gets worse.  You have tingling and numbness.  You have changes in skin color, including paleness or a bluish color.  Your fingers are cold. Summary  A wrist splint is a flexible device that supports your wrist and keeps your wrist from moving.  It is important to wear your splint as told by your doctor. This helps to make sure that your wrist heals correctly.  Icing, moving your fingers, and raising your wrist above the level of your heart will help you manage pain, stiffness, and swelling.  Your wrist splint should be tight enough to support your wrist. It should not block your blood supply.  Get help right away if your fingers tingle, get numb, or turn cold and blue. Loosen the splint right away. This information is not intended to replace advice given to you by your health care provider. Make sure you discuss any questions you have with your health care provider. Document Revised: 07/11/2018 Document Reviewed: 06/10/2016 Elsevier Patient Education  2020 [employer].  Information on my medicine - ELIQUIS (apixaban)  This medication education was reviewed with me or my healthcare representative as part of my discharge preparation.  The pharmacist that spoke with me during my hospital stay was:  Jennette Kettle, [facility]  Why was Eliquis prescribed for you? Eliquis was prescribed for you to reduce the risk of forming blood  clots that can cause a stroke if you have a  medical condition called atrial fibrillation (a type of irregular heartbeat) OR to reduce the risk of a blood clots forming after orthopedic surgery.  What do You need to know about Eliquis ? Take your Eliquis TWICE DAILY - one tablet in the morning and one tablet in the evening with or without food.  It would be best to take the doses about the same time each day.  If you have difficulty swallowing the tablet whole please discuss with your pharmacist how to take the medication safely.  Take Eliquis exactly as prescribed by your doctor and DO NOT stop taking Eliquis without talking to the doctor who prescribed the medication.  Stopping may increase your risk of developing a new clot or stroke.  Refill your prescription before you run out.  After discharge, you should have regular check-up appointments with your healthcare provider that is prescribing your Eliquis.  In the future your dose may need to be changed if your kidney function or weight changes by a significant amount or as you get older.  What do you do if you miss a dose? If you miss a dose, take it as soon as you remember on the same day and resume taking twice daily.  Do not take more than one dose of ELIQUIS at the same time.  Important Safety Information A possible side effect of Eliquis is bleeding. You should call your healthcare provider right away if you experience any of the following: ? Bleeding from an injury or your nose that does not stop. ? Unusual colored urine (red or dark brown) or unusual colored stools (red or black). ? Unusual bruising for unknown reasons. ? A serious fall or if you hit your head (even if there is no bleeding).  Some medicines may interact with Eliquis and might increase your risk of bleeding or clotting while on Eliquis. To help avoid this, consult your healthcare provider or pharmacist prior to using any new prescription or non-prescription medications, including herbals, vitamins,  non-steroidal anti-inflammatory drugs (NSAIDs) and supplements.  This website has more information on Eliquis (apixaban): [email or web address].

## 2020-03-03 NOTE — Progress Notes (Signed)
Discharge instructions will be given to patient when she is ready to leave (including medications) will be discussed with and a copy will be provided to patient.  Patient states she won't be able to get picked up until later in the afternoon today.

## 2020-03-03 NOTE — Discharge Summary (Signed)
[facility] Discharge Summary   Patient ID: Kara Stewart MRN: [id number] DOB/AGE: Aug 12, 1975 44 y.o.  Admit date: 02/23/2020 Discharge date: 03/03/2020  Discharge Diagnosis Patient Active Problem List   Diagnosis Date Noted  . Portal vein thrombus    . Left radius fracture   . Splenic laceration 02/23/2020  . Right sided abdominal pain 04/12/2019  . Abnormal LFTs 10/10/2018  . Unwanted fertility 09/12/2018  . 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 Chronic liver disease 11/16/2017  . History of marijuana use 01/20/2015  . Abnormal CT scan, colon 01/08/2015  . Anxiety 11/29/2014  . History of colitis 11/29/2014  . RUQ pain 08/27/2014  . Thyroid nodule 01/11/2014  . SVT (supraventricular tachycardia) (HCC) 09/07/2012  . History of alcohol use 07/21/2012  . Fatty liver 10/28/2011   Consultants Orthopedic surgery - Dr. Betha Loa   Imaging: 02/23/20 - DG forearm Distal radius fracture   02/23/20 - CT abdomen/pelvis, CT angio chest PE IMPRESSION: 1. Grade 4 splenic injury with multiple large lacerations extending to the splenic hilum, small vascular injuries with contrast extravasation and moderate volume abdominal/pelvic hemoperitoneum. 2. No CT findings for pulmonary embolism. 3. No acute pulmonary findings or pneumothorax. 4. No acute bony findings. 5. Diffuse fatty infiltration of the liver.  02/26/20 - CT abdomen/pelvis with contrast  IMPRESSION: 1. Thrombus within the main portal vein and central intrahepatic portal venous branches. 2. Status post splenectomy. Small amount of fluid in the surgical bed, likely post operative seroma. 3. Edema and fluid surrounding the pancreas may be related to postsurgical changes. Correlation with pancreatic enzymes recommended to exclude acute pancreatitis. 4. Fatty liver with possible early changes of cirrhosis. Clinical correlation is recommended. 5. A 3 cm right ovarian cyst. 6. Trace bilateral  pleural effusions with small bibasilar atelectasis.  03/01/20 - DG Chest  IMPRESSION: No active disease.  Procedures Dr. Sophronia Simas (02/23/20) - open splenectomy, open liver biopsy  HPI:  Kara Stewart is a 44 y.o. female with a history of prior alcohol abuse w/ alcohol related hepatitis and hypothyroidism who was transferred from [facility] after a fall and found to have laceration and left impacted distal radius fracture.   Patient reports that she fells on 2 separate occassions over the last 1 week. No head trauma or LOC. She now reports abdominal pain and left wrist pain. She presented to the urgent care this AM where she was tachycardic and hypotensive. Sent to the ED for further eval. Hgb 12.9 this AM at 0947. Workup included above imaging significant for splenic rupture and radius fracture. Patient was transferred to [facility] for further management and trauma admission. There is some mention that falls are 2/2 joint pain that she is seeing a rheumatologist for.   Hospital Course:  Patient was admitted to the hospital, below are her current list of injuries along with their management.   Fall G5splenic laceration -s/pex lap, splenectomy, liver bxby Dr. Freida Busman 11/19.Surgical pathshows steatosis and some fibrosis. continue mylicon andPOpepcid. Repeat CT abdomen/pelvis was ordered 11/22 due to worsening ileus and ABL anemia, no bleeding was found but portal vein thrombus was identified. Ileus resolved. Post-splenectomy vaccines given prior to discharge (ACTHIB, MENVEO, PREVNAR 13).   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 Dr. Bedelia Person spoke with oncologist on call regarding possibility of a chronic underlying hypercoagulable disorder and this is felt to be unlikely. Could consider follow up with hematologist Dr. Candise Che or Dr. Leonides Schanz after hospitalization.Transitioned to [facility] 11/25 and  remains hemodynamically stable without signs of bleeding.   ABL anemia-  hgb 9.6 from 8.3, stable/improving  Left impacted distal radius fracture -Hand c/s, Dr. Merlyn Lot, continue splint, may f/u as o/p for repeat X-rays and evaluation.  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 LFTs and lipase are WNL today. Patient to send results over liver biopsy to her physician.  On 03/03/20 the patients vitals were stable, pain controlled on oral medications, tolerating PO, having non-bloody bowel movements, mobilizing independently and felt stable for discharge home. PT recommended outpatient PT which was ordered. Patient will require follow up with orthopedic surgery for wrist fracture, follow up in our office for staple removal, drain removal, and post-op follow up with Dr. Freida Busman. She will also need to follow up with PCP for management of Eliquis/PVT, pneumococcal (PCV23) vaccine 8 weeks after initial post-splenectomy vaccines, and general establishment of a PCP. She did not have a PCP so our case manager helped her find one during her admission.   Patient knows to call with questions/concerns.  Physical Exam: General appearance: alert and cooperative Chest: RRR, normal effort of breathing, lungs CTAB GI: soft, appropriately tender;non-distended,+BS; no masses, no organomegaly, Jp SS,staples are c/d/i there is no cellulitis or erythema around incision. GM:WNUUVO to LUE, L fingers WWP  Allergies as of 03/03/2020      Reactions   Penicillins Anaphylaxis   Has patient had a PCN reaction causing immediate rash, facial/tongue/throat swelling, SOB or lightheadedness with hypotension: yes Has patient had a PCN reaction causing severe rash involving mucus membranes or skin necrosis: No Has patient had a PCN reaction that required hospitalization  Yes Has patient had a PCN reaction occurring within the last 10 years: No If all of the above answers are "NO", then may proceed with Cephalosporin use.   Lidocaine Other (See Comments)   Can use topical lidocaine but if ingested causes Hallucinations.      Medication List    TAKE these medications   acetaminophen 325 MG tablet Commonly known as: TYLENOL Take 2 tablets (650 mg total) by mouth every 6 (six) hours as needed.   albuterol 108 (90 Base) MCG/ACT inhaler Commonly known as: VENTOLIN HFA Inhale 2 puffs into the lungs every 6 (six) hours as needed for wheezing or shortness of breath.   apixaban 5 MG Tabs tablet Commonly known as: ELIQUIS Take 1 tablet (5 mg total) by mouth 2 (two) times daily.   folic acid 1 MG tablet Commonly known as: FOLVITE TAKE 1 TABLET BY MOUTH EVERY DAY   haemophilus B polysaccharide conjugate vaccine injection Commonly known as: ActHIB Inject 0.5 mLs into the muscle once for 1 dose.   ibuprofen 400 MG tablet Commonly known as: ADVIL Take 1 tablet (400 mg total) by mouth every 6 (six) hours as needed for moderate pain. What changed:   medication strength  how much to take  reasons to take this   meningococcal oligosaccharide injection Commonly known as: MENVEO Inject 0.5 mLs into the muscle once for 1 dose.   methocarbamol 500 MG tablet Commonly known as: ROBAXIN Take 2 tablets (1,000 mg total) by mouth every 6 (six) hours as needed for muscle spasms (pain not releived by tylenol or ibuprofen).   MILK THISTLE PO Take 1 tablet by mouth daily.   ondansetron 4 MG disintegrating  tablet Commonly known as: ZOFRAN-ODT Take 1 tablet (4 mg total) by mouth every 8 (eight) hours as needed for nausea or vomiting.   oxyCODONE 5 MG immediate release tablet Commonly known as: Oxy IR/ROXICODONE Take 2 tablets (10 mg total) by mouth every 6 (six) hours as needed for up to 5 days for moderate pain or severe pain (10mg  PRN Severe Pain, 5mg   Moderate Pain after Ibuprofen).   pneumococcal 13-valent conjugate vaccine Susp injection Commonly known as: PREVNAR 13 Inject 0.5 mLs into the muscle once for 1 dose.   ursodiol 500 MG tablet Commonly known as: ACTIGALL TAKE 1 TABLET BY MOUTH TWICE A DAY What changed: when to take this   VITAMIN B-12 IJ Inject as directed every 30 (thirty) days.   VITAMIN D3 PO Take 1 tablet by mouth daily.            Durable Medical Equipment  (From admission, onward)         Start     Ordered   03/01/20 1046  For home use only DME Cane  Once        03/01/20 1045            Follow-up Information    [facility] Follow up.   Why: follow up for management of blood thinner. Will also need your pneumococcal 23 vaccine in 8 weeks.  Contact information: [address] [place] [address] [phone]       [id number], MD. Call.   Specialty: Orthopedic Surgery Why: Call and schedule follow up regarding left wrist fracture Contact information: [address] [place] [address] [place] [phone]        Surgery, [place]. Go on 03/08/2020.   Specialty: General Surgery Why: Our office is scheduling staple removal for you, please call to confirm time.  Contact information: [address] [place] 14/06/2019 [place] [phone]        [facility] Follow up.   Why: Office is scheduling follow up in 2-3 weeks, please call to confirm appointment date/time.  Contact information: [address] [place] [address] [place] [phone]              Signed: [id number], [facility] 03/03/2020, 11:38 AM

## 2020-03-03 NOTE — Plan of Care (Signed)
  Problem: Education: Goal: Knowledge of General Education information will improve Description: Including pain rating scale, medication(s)/side effects and non-pharmacologic comfort measures Outcome: Progressing   Problem: Health Behavior/Discharge Planning: Goal: Ability to manage health-related needs will improve Outcome: Progressing   Problem: Clinical Measurements: Goal: Ability to maintain clinical measurements within normal limits will improve Outcome: Progressing Goal: Will remain free from infection Outcome: Progressing Goal: Diagnostic test results will improve Outcome: Progressing Goal: Respiratory complications will improve Outcome: Progressing Goal: Cardiovascular complication will be avoided Outcome: Progressing   Problem: Activity: Goal: Risk for activity intolerance will decrease Outcome: Progressing   Problem: Nutrition: Goal: Adequate nutrition will be maintained Outcome: Progressing   Problem: Coping: Goal: Level of anxiety will decrease Outcome: Progressing   Problem: Elimination: Goal: Will not experience complications related to bowel motility Outcome: Progressing Goal: Will not experience complications related to urinary retention Outcome: Progressing   Problem: Pain Managment: Goal: General experience of comfort will improve Outcome: Progressing   Problem: Safety: Goal: Ability to remain free from injury will improve Outcome: Progressing   Problem: Skin Integrity: Goal: Risk for impaired skin integrity will decrease Outcome: Progressing   Problem: Activity: Goal: Ability to perform activities at highest level will improve Outcome: Progressing Goal: Ability to avoid complications of mobility impairment will improve Outcome: Progressing Goal: Ability to tolerate increased activity will improve Outcome: Progressing Goal: Will remain free from falls Outcome: Progressing   Problem: Respiratory: Goal: Ability to maintain adequate  ventilation will improve Outcome: Progressing   Problem: Tissue Perfusion: Goal: Hemodynamically stable with effective tissue perfusion will improve Outcome: Progressing Goal: Postoperative complications will be avoided or minimized Outcome: Progressing   Problem: Skin Integrity: Goal: Ability to maintain adequate tissue integrity will improve Outcome: Progressing   Problem: Infection: Goal: Risk for infection will decrease (spleen) Outcome: Progressing   Problem: Bowel/Gastric: Goal: Gastrointestinal status for postoperative course will improve Outcome: Progressing Goal: GI tract motility and GI tissue perfusion will improve Outcome: Progressing Goal: Ability to demonstrate the techniques of an individualized bowel program will improve Outcome: Progressing   Problem: Urinary Elimination: Goal: Ability to achieve a regular elimination pattern will improve Outcome: Progressing   Problem: Fluid Volume: Goal: Return to normovolemic state will improve Outcome: Progressing   Problem: Metabolic: Goal: Ability to maintain a metabolic balance will improve Outcome: Progressing   Problem: Education: Goal: Knowledge of the prescribed therapeutic regimen will improve Outcome: Progressing Goal: Knowledge of injury and care (of blunt abdominal trauma) will improve Outcome: Progressing   Problem: Coping: Goal: Exhibits appropriate coping mechanism and reduced anxiety resulting from physical and emotional stress Outcome: Progressing   Problem: Self-Concept: Goal: Ability to acknowledge body changes will improve Outcome: Progressing   

## 2020-03-03 NOTE — Plan of Care (Signed)
Problem: Education: Goal: Knowledge of General Education information will improve Description: Including pain rating scale, medication(s)/side effects and non-pharmacologic comfort measures 03/03/2020 1136 by Sandford Craze, RN Outcome: Adequate for Discharge 03/03/2020 1135 by Sandford Craze, RN Outcome: Progressing   Problem: Health Behavior/Discharge Planning: Goal: Ability to manage health-related needs will improve 03/03/2020 1136 by Sandford Craze, RN Outcome: Adequate for Discharge 03/03/2020 1135 by Sandford Craze, RN Outcome: Progressing   Problem: Clinical Measurements: Goal: Ability to maintain clinical measurements within normal limits will improve 03/03/2020 1136 by Sandford Craze, RN Outcome: Adequate for Discharge 03/03/2020 1135 by Sandford Craze, RN Outcome: Progressing Goal: Will remain free from infection 03/03/2020 1136 by Sandford Craze, RN Outcome: Adequate for Discharge 03/03/2020 1135 by Sandford Craze, RN Outcome: Progressing Goal: Diagnostic test results will improve 03/03/2020 1136 by Sandford Craze, RN Outcome: Adequate for Discharge 03/03/2020 1135 by Sandford Craze, RN Outcome: Progressing Goal: Respiratory complications will improve 03/03/2020 1136 by Sandford Craze, RN Outcome: Adequate for Discharge 03/03/2020 1135 by Sandford Craze, RN Outcome: Progressing Goal: Cardiovascular complication will be avoided 03/03/2020 1136 by Sandford Craze, RN Outcome: Adequate for Discharge 03/03/2020 1135 by Sandford Craze, RN Outcome: Progressing   Problem: Activity: Goal: Risk for activity intolerance will decrease 03/03/2020 1136 by Sandford Craze, RN Outcome: Adequate for Discharge 03/03/2020 1135 by Sandford Craze, RN Outcome: Progressing   Problem: Nutrition: Goal: Adequate nutrition will be maintained 03/03/2020 1136 by Sandford Craze, RN Outcome: Adequate for Discharge 03/03/2020 1135 by Sandford Craze, RN Outcome:  Progressing   Problem: Coping: Goal: Level of anxiety will decrease 03/03/2020 1136 by Sandford Craze, RN Outcome: Adequate for Discharge 03/03/2020 1135 by Sandford Craze, RN Outcome: Progressing   Problem: Elimination: Goal: Will not experience complications related to bowel motility 03/03/2020 1136 by Sandford Craze, RN Outcome: Adequate for Discharge 03/03/2020 1135 by Sandford Craze, RN Outcome: Progressing Goal: Will not experience complications related to urinary retention 03/03/2020 1136 by Sandford Craze, RN Outcome: Adequate for Discharge 03/03/2020 1135 by Sandford Craze, RN Outcome: Progressing   Problem: Pain Managment: Goal: General experience of comfort will improve 03/03/2020 1136 by Sandford Craze, RN Outcome: Adequate for Discharge 03/03/2020 1135 by Sandford Craze, RN Outcome: Progressing   Problem: Safety: Goal: Ability to remain free from injury will improve 03/03/2020 1136 by Sandford Craze, RN Outcome: Adequate for Discharge 03/03/2020 1135 by Sandford Craze, RN Outcome: Progressing   Problem: Skin Integrity: Goal: Risk for impaired skin integrity will decrease 03/03/2020 1136 by Sandford Craze, RN Outcome: Adequate for Discharge 03/03/2020 1135 by Sandford Craze, RN Outcome: Progressing   Problem: Activity: Goal: Ability to perform activities at highest level will improve 03/03/2020 1136 by Sandford Craze, RN Outcome: Adequate for Discharge 03/03/2020 1135 by Sandford Craze, RN Outcome: Progressing Goal: Ability to avoid complications of mobility impairment will improve 03/03/2020 1136 by Sandford Craze, RN Outcome: Adequate for Discharge 03/03/2020 1135 by Sandford Craze, RN Outcome: Progressing Goal: Ability to tolerate increased activity will improve 03/03/2020 1136 by Sandford Craze, RN Outcome: Adequate for Discharge 03/03/2020 1135 by Sandford Craze, RN Outcome: Progressing Goal: Will remain free from  falls 03/03/2020 1136 by Sandford Craze, RN Outcome: Adequate for Discharge 03/03/2020 1135 by Sandford Craze, RN Outcome: Progressing   Problem: Respiratory: Goal: Ability to maintain adequate ventilation will improve 03/03/2020 1136 by  Sandford Craze, RN Outcome: Adequate for Discharge 03/03/2020 1135 by Sandford Craze, RN Outcome: Progressing   Problem: Tissue Perfusion: Goal: Hemodynamically stable with effective tissue perfusion will improve 03/03/2020 1136 by Sandford Craze, RN Outcome: Adequate for Discharge 03/03/2020 1135 by Sandford Craze, RN Outcome: Progressing Goal: Postoperative complications will be avoided or minimized 03/03/2020 1136 by Sandford Craze, RN Outcome: Adequate for Discharge 03/03/2020 1135 by Sandford Craze, RN Outcome: Progressing   Problem: Skin Integrity: Goal: Ability to maintain adequate tissue integrity will improve 03/03/2020 1136 by Sandford Craze, RN Outcome: Adequate for Discharge 03/03/2020 1135 by Sandford Craze, RN Outcome: Progressing   Problem: Infection: Goal: Risk for infection will decrease (spleen) 03/03/2020 1136 by Sandford Craze, RN Outcome: Adequate for Discharge 03/03/2020 1135 by Sandford Craze, RN Outcome: Progressing   Problem: Bowel/Gastric: Goal: Gastrointestinal status for postoperative course will improve 03/03/2020 1136 by Sandford Craze, RN Outcome: Adequate for Discharge 03/03/2020 1135 by Sandford Craze, RN Outcome: Progressing Goal: GI tract motility and GI tissue perfusion will improve 03/03/2020 1136 by Sandford Craze, RN Outcome: Adequate for Discharge 03/03/2020 1135 by Sandford Craze, RN Outcome: Progressing Goal: Ability to demonstrate the techniques of an individualized bowel program will improve 03/03/2020 1136 by Sandford Craze, RN Outcome: Adequate for Discharge 03/03/2020 1135 by Sandford Craze, RN Outcome: Progressing   Problem: Urinary Elimination: Goal: Ability to  achieve a regular elimination pattern will improve 03/03/2020 1136 by Sandford Craze, RN Outcome: Adequate for Discharge 03/03/2020 1135 by Sandford Craze, RN Outcome: Progressing   Problem: Fluid Volume: Goal: Return to normovolemic state will improve 03/03/2020 1136 by Sandford Craze, RN Outcome: Adequate for Discharge 03/03/2020 1135 by Sandford Craze, RN Outcome: Progressing   Problem: Metabolic: Goal: Ability to maintain a metabolic balance will improve 62/83/1517 1136 by Sandford Craze, RN Outcome: Adequate for Discharge 03/03/2020 1135 by Sandford Craze, RN Outcome: Progressing   Problem: Education: Goal: Knowledge of the prescribed therapeutic regimen will improve 03/03/2020 1136 by Sandford Craze, RN Outcome: Adequate for Discharge 03/03/2020 1135 by Sandford Craze, RN Outcome: Progressing Goal: Knowledge of injury and care (of blunt abdominal trauma) will improve 03/03/2020 1136 by Sandford Craze, RN Outcome: Adequate for Discharge 03/03/2020 1135 by Sandford Craze, RN Outcome: Progressing   Problem: Coping: Goal: Exhibits appropriate coping mechanism and reduced anxiety resulting from physical and emotional stress 03/03/2020 1136 by Sandford Craze, RN Outcome: Adequate for Discharge 03/03/2020 1135 by Sandford Craze, RN Outcome: Progressing   Problem: Self-Concept: Goal: Ability to acknowledge body changes will improve 03/03/2020 1136 by Sandford Craze, RN Outcome: Adequate for Discharge 03/03/2020 1135 by Sandford Craze, RN Outcome: Progressing

## 2020-03-04 LAB — POCT I-STAT, CHEM 8

## 2020-03-06 ENCOUNTER — Other Ambulatory Visit: Payer: Self-pay

## 2020-03-06 ENCOUNTER — Ambulatory Visit (INDEPENDENT_AMBULATORY_CARE_PROVIDER_SITE_OTHER): Payer: Medicaid Other | Admitting: Internal Medicine

## 2020-03-06 ENCOUNTER — Encounter: Payer: Self-pay | Admitting: Internal Medicine

## 2020-03-06 VITALS — BP 117/74 | HR 99 | Temp 98.3°F | Resp 18 | Ht 63.0 in | Wt 155.8 lb

## 2020-03-06 DIAGNOSIS — Z7689 Persons encountering health services in other specified circumstances: Secondary | ICD-10-CM

## 2020-03-06 DIAGNOSIS — K743 Primary biliary cirrhosis: Secondary | ICD-10-CM | POA: Diagnosis not present

## 2020-03-06 DIAGNOSIS — Z9081 Acquired absence of spleen: Secondary | ICD-10-CM | POA: Insufficient documentation

## 2020-03-06 DIAGNOSIS — M2550 Pain in unspecified joint: Secondary | ICD-10-CM | POA: Insufficient documentation

## 2020-03-06 DIAGNOSIS — S62109A Fracture of unspecified carpal bone, unspecified wrist, initial encounter for closed fracture: Secondary | ICD-10-CM

## 2020-03-06 DIAGNOSIS — I81 Portal vein thrombosis: Secondary | ICD-10-CM

## 2020-03-06 DIAGNOSIS — D539 Nutritional anemia, unspecified: Secondary | ICD-10-CM

## 2020-03-06 DIAGNOSIS — W19XXXS Unspecified fall, sequela: Secondary | ICD-10-CM | POA: Diagnosis not present

## 2020-03-06 DIAGNOSIS — S62109D Fracture of unspecified carpal bone, unspecified wrist, subsequent encounter for fracture with routine healing: Secondary | ICD-10-CM

## 2020-03-06 DIAGNOSIS — W19XXXA Unspecified fall, initial encounter: Secondary | ICD-10-CM | POA: Insufficient documentation

## 2020-03-06 HISTORY — DX: Nutritional anemia, unspecified: D53.9

## 2020-03-06 HISTORY — DX: Pain in unspecified joint: M25.50

## 2020-03-06 HISTORY — DX: Acquired absence of spleen: Z90.81

## 2020-03-06 HISTORY — DX: Portal vein thrombosis: I81

## 2020-03-06 HISTORY — DX: Fracture of unspecified carpal bone, unspecified wrist, initial encounter for closed fracture: S62.109A

## 2020-03-06 NOTE — Progress Notes (Signed)
b re

## 2020-03-06 NOTE — Assessment & Plan Note (Signed)
On Ursodiol Follows up with GI On vitamin supplements 

## 2020-03-06 NOTE — Patient Instructions (Addendum)
Please continue to take medications as prescribed.  Please get blood tests done in the next week.  Please continue to follow up with General Surgeon and Orthopedic surgeon as scheduled.  Please continue to follow surgeon's instructions for the care of the drain.

## 2020-03-06 NOTE — Assessment & Plan Note (Signed)
Care established Previous chart reviewed History and medications reviewed with the patient 

## 2020-03-06 NOTE — Assessment & Plan Note (Signed)
S/p closed reduction Orthopedic surgery referral provided for local care as per patient request.

## 2020-03-06 NOTE — Assessment & Plan Note (Signed)
On Eliquis F/u with GI

## 2020-03-06 NOTE — Assessment & Plan Note (Signed)
Likely due to folic acid and Vit B12 def. in the setting of PBC On Folic acid tablets Vit B12 injection

## 2020-03-06 NOTE — Progress Notes (Signed)
New Patient Office Visit  Subjective:  Patient ID: Kara Stewart, female    DOB: Aug 30, 1975  Age: 44 y.o. MRN: [id number]  CC:  Chief Complaint  Patient presents with  . New Patient (Initial Visit)    new pt was just discharged from [facility] sunday 03-03-20 also just broke wrist 02-22-20 from fall    HPI Kara Stewart is a 44 year old female with past medical history of PBC, macrocytic anemia, recurrent falls, recent hospitalization for splenic laceration s/p splenectomy and portal vein thrombosis who presents for establishing care.  Patient was recently admitted in the hospital after a fall.  She had splenic laceration and left radial fracture.  She had splenectomy and still has a drain in place.  In the same admission, she was found to have portal vein thrombosis and has been placed on Eliquis since then.  Patient has a follow-up appointment with general surgeon.  Patient had closed reduction of her radial fracture in the urgent care, and requests local orthopedic surgery referral.  She has a wrist splint in place.  Regarding the falls, she describes them as her legs giving up leading to the falls.  She denies any prodromal symptoms like dizziness, lightheadedness, chest pain, dyspnea or palpitations before the falls.  She denies any LOC.  She denies any seizure-like movements.  Patient has a history of PBC, for which she has been following up with GI.  She has been on folic acid and vitamin B12 supplements.  She had been complaining of multiple joint pains, for which she was referred to rheumatologist.  She is undergoing evaluation for polyarthralgia.  Patient has had a colonoscopy in the past, which showed diverticulosis and mild internal hemorrhoids.  Patient had meningococcal and flu vaccine in the hospital.  She has not had COVID and pneumococcal vaccine yet.    Past Medical History:  Diagnosis Date  . Acid reflux   . 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 Alcoholic hepatitis  without ascites   . Anxiety   . Asthma   . Colitis   . GERD (gastroesophageal reflux disease) 07/21/2012  . Hiatal hernia   . History of alcohol abuse 04/26/2015  . Hypertriglyceridemia   . Hypothyroid   . Iritis    frequent  . Ovarian cyst   . Panic attacks   . SVT (supraventricular tachycardia) (HCC)    last issue 12/2017    Past Surgical History:  Procedure Laterality Date  . BIOPSY N/A 02/05/2015   Procedure: BIOPSY;  Surgeon: West Bali, MD;  Location: [facility];  Service: Endoscopy;  Laterality: N/A;  . COLONOSCOPY    . COLONOSCOPY WITH PROPOFOL N/A 02/05/2015   JME:QASTM HH/mild diverticulosis  . ESOPHAGEAL DILATION N/A 11/21/2015   Procedure: ESOPHAGEAL DILATION;  Surgeon: Corbin Ade, MD;  Location: [facility];  Service: Endoscopy;  Laterality: N/A;  . ESOPHAGOGASTRODUODENOSCOPY  11/06/2011   [name]: MILD Esophagitis/PATENT ESOPHAGEAL Stricture/  Moderate gastritis. Bx no.hpylori or celiac, +gastritis  . ESOPHAGOGASTRODUODENOSCOPY (EGD) WITH PROPOFOL N/A 03/20/2014   [name]: 1. Mild esophagitis & distal esophagela stricture. 2. small hiatal hernia 3. moderate non-erosive gastritis and mild duodenits  . ESOPHAGOGASTRODUODENOSCOPY (EGD) WITH PROPOFOL N/A 11/21/2015   Dr. Jena Gauss: LA grade B esophagitis, MW tear likely source of hematemesis  . LAPAROSCOPIC TUBAL LIGATION Bilateral 09/14/2018   Procedure: LAPAROSCOPIC TUBAL LIGATION;  Surgeon: Allie Bossier, MD;  Location: [facility];  Service: Gynecology;  Laterality: Bilateral;  . LIVER BIOPSY  02/23/2020   Procedure: OPEN  LIVER BIOPSY;  Surgeon: Fritzi MandesAllen, Shelby L, MD;  [facility];  Service: General;;  . Gaspar BiddingSAVORY DILATION N/A 03/20/2014   Procedure: Gaspar BiddingSAVORY DILATION;  Surgeon: West BaliSandi L Fields, MD;  Location: [facility];  Service: Endoscopy;  Laterality: N/A;  dilated with # 12.8, 14,15,16  . SPLENECTOMY, TOTAL N/A 02/23/2020   Procedure: SPLENECTOMY;  Surgeon: Fritzi MandesAllen, Shelby L, MD;  [facility];  Service: General;   Laterality: N/A;  . TOOTH EXTRACTION      Family History  Problem Relation Age of Onset  . Stomach cancer Paternal Grandfather        colon cancer  . Cancer Paternal Grandfather        throat and esophagus  . Breast cancer Maternal Grandmother   . Cancer Maternal Grandmother        skin  . Anxiety disorder Maternal Grandmother   . Hypertension Mother   . Other Mother        fatty liver  . Hyperlipidemia Mother   . Liver disease Mother        fatty liver, does not drink.   . Sleep apnea Mother   . Other Father        varicose veins; stomach issues; hernia  . Hypertension Father   . Hyperlipidemia Father   . Cancer Father        prostate  . Arthritis Father        rheumatoid  . Neuropathy Father   . Rheum arthritis Father   . Thyroid disease Sister   . Hypertension Sister   . Other Brother        hernia  . Diabetes Maternal Grandfather   . Heart disease Maternal Grandfather   . Other Paternal Grandmother        hernia  . COPD Paternal Grandmother   . Diabetes Paternal Grandmother   . Healthy Daughter     Social History   Socioeconomic History  . Marital status: Divorced    Spouse name: Not on file  . Number of children: 0  . Years of education: Not on file  . Highest education level: Not on file  Occupational History  . Occupation: was at [employer] but not working now  Tobacco Use  . Smoking status: Current Some Day Smoker    Types: Cigarettes    Last attempt to quit: 12/22/2017    Years since quitting: 2.2  . Smokeless tobacco: Never Used  . Tobacco comment: "might smoke one on a bad day"  Vaping Use  . Vaping Use: Never used  Substance and Sexual Activity  . Alcohol use: No    Alcohol/week: 0.0 standard drinks    Comment: Sober since 2017  . Drug use: Never  . Sexual activity: Yes    Birth control/protection: Condom  Other Topics Concern  . Not on file  Social History Narrative   Lives alone with a roommate. Dating and in safe relationship. Does not  smoke. Denies drug use.    Previous MD: Phill MutterAnn Lewis, NP ([place], [place])      Social Determinants of Health   Financial Resource Strain:   . Difficulty of Paying Living Expenses: Not on file  Food Insecurity:   . Worried About Programme researcher, broadcasting/film/videounning Out of Food in the Last Year: Not on file  . Ran Out of Food in the Last Year: Not on file  Transportation Needs:   . Lack of Transportation (Medical): Not on file  . Lack of Transportation (Non-Medical): Not on file  Physical  Activity:   . Days of Exercise per Week: Not on file  . Minutes of Exercise per Session: Not on file  Stress:   . Feeling of Stress : Not on file  Social Connections:   . Frequency of Communication with Friends and Family: Not on file  . Frequency of Social Gatherings with Friends and Family: Not on file  . Attends Religious Services: Not on file  . Active Member of Clubs or Organizations: Not on file  . Attends Banker Meetings: Not on file  . Marital Status: Not on file  Intimate Partner Violence:   . Fear of Current or Ex-Partner: Not on file  . Emotionally Abused: Not on file  . Physically Abused: Not on file  . Sexually Abused: Not on file    ROS Review of Systems  Constitutional: Positive for fatigue. Negative for chills and fever.  HENT: Negative for congestion, sinus pressure, sinus pain and sore throat.   Eyes: Negative for pain and discharge.  Respiratory: Negative for cough and shortness of breath.   Cardiovascular: Negative for chest pain and palpitations.  Gastrointestinal: Negative for abdominal pain, constipation, diarrhea, nausea and vomiting.  Endocrine: Negative for polydipsia and polyuria.  Genitourinary: Negative for dysuria and hematuria.  Musculoskeletal: Positive for arthralgias, back pain and myalgias. Negative for neck stiffness.  Skin: Positive for wound.  Neurological: Negative for dizziness, syncope, weakness and headaches.  Psychiatric/Behavioral: Negative for agitation and  behavioral problems.    Objective:   Today's Vitals: BP 117/74 (BP Location: Right Arm, Patient Position: Sitting, Cuff Size: Normal)   Pulse 99   Temp 98.3 F (36.8 C) (Oral)   Resp 18   Ht 5\' 3"  (1.6 m)   Wt 155 lb 12.8 oz (70.7 kg)   LMP 02/23/2020 (Exact Date)   SpO2 100%   BMI 27.60 kg/m   Physical Exam Vitals reviewed.  Constitutional:      General: She is not in acute distress.    Appearance: She is not diaphoretic.  HENT:     Head: Normocephalic.     Nose: Nose normal.     Mouth/Throat:     Mouth: Mucous membranes are moist.  Eyes:     General: No scleral icterus.    Extraocular Movements: Extraocular movements intact.     Pupils: Pupils are equal, round, and reactive to light.  Cardiovascular:     Rate and Rhythm: Normal rate and regular rhythm.     Pulses: Normal pulses.     Heart sounds: Normal heart sounds. No murmur heard.   Pulmonary:     Breath sounds: Normal breath sounds. No wheezing or rales.  Abdominal:     Palpations: Abdomen is soft.     Tenderness: There is no abdominal tenderness.     Comments: S/p splenectomy, drain present - serous discharge noted  Musculoskeletal:     Cervical back: Neck supple. No tenderness.     Right lower leg: No edema.     Left lower leg: No edema.     Comments: Left wrist splint present  Skin:    General: Skin is warm.     Comments: Bruising over right arm  Neurological:     General: No focal deficit present.     Mental Status: She is alert and oriented to person, place, and time.     Sensory: No sensory deficit.     Motor: No weakness.  Psychiatric:        Mood and Affect: Mood  normal.        Behavior: Behavior normal.     Assessment & Plan:   Problem List Items Addressed This Visit      Encounter to establish care - Primary   Care established Previous chart reviewed History and medications reviewed with the patient       Cardiovascular and Mediastinum   Deep vein thrombosis of portal vein     On Eliquis F/u with GI        Digestive   Primary biliary cirrhosis (HCC)    On Ursodiol Follows up with GI On vitamin supplements        Musculoskeletal and Integument   Closed fracture of wrist    S/p closed reduction Orthopedic surgery referral provided for local care as per patient request.      Relevant Orders   Ambulatory referral to Orthopedic Surgery     Other         S/P splenectomy    On 11/19 for splenic laceration after a fall Has drain in place, serous drainage noted. Continue to follow up with General surgeon Received Meningococcal and flu vaccine in the hospital. Needs Pneumococcal vaccine, in the next visit.      Macrocytic anemia    Likely due to folic acid and Vit B12 def. in the setting of PBC On Folic acid tablets Vit B12 injection      Relevant Orders   CBC with Differential/Platelet   Falls    Multiple falls in the past, likely mechanical in nature No h/o syncope C/o polyarthralgia, physical deconditioning likely contributing to falls      Polyarthralgia    Undergoing Rheumatology evaluation Tylenol PRN, cautious about liver effects in the presence of PBC         Outpatient Encounter Medications as of 03/06/2020  Medication Sig  . acetaminophen (TYLENOL) 325 MG tablet Take 2 tablets (650 mg total) by mouth every 6 (six) hours as needed.  Marland Kitchen apixaban (ELIQUIS) 5 MG TABS tablet Take 1 tablet (5 mg total) by mouth 2 (two) times daily.  . Cholecalciferol (VITAMIN D3 PO) Take 1 tablet by mouth daily.   . Cyanocobalamin (VITAMIN B-12 IJ) Inject as directed every 30 (thirty) days.  . folic acid (FOLVITE) 1 MG tablet TAKE 1 TABLET BY MOUTH EVERY DAY (Patient taking differently: Take 1 mg by mouth daily. )  . methocarbamol (ROBAXIN) 500 MG tablet Take 2 tablets (1,000 mg total) by mouth every 6 (six) hours as needed for muscle spasms (pain not releived by tylenol or ibuprofen).  Marland Kitchen MILK THISTLE PO Take 1 tablet by mouth daily.   . ondansetron  (ZOFRAN-ODT) 4 MG disintegrating tablet Take 1 tablet (4 mg total) by mouth every 8 (eight) hours as needed for nausea or vomiting.  Marland Kitchen oxyCODONE (OXY IR/ROXICODONE) 5 MG immediate release tablet Take 2 tablets (10 mg total) by mouth every 6 (six) hours as needed for up to 5 days for moderate pain or severe pain (10mg  PRN Severe Pain, 5mg  Moderate Pain after Ibuprofen).  . ursodiol (ACTIGALL) 500 MG tablet TAKE 1 TABLET BY MOUTH TWICE A DAY (Patient taking differently: Take 500 mg by mouth in the morning and at bedtime. )  . [DISCONTINUED] ibuprofen (ADVIL) 400 MG tablet Take 1 tablet (400 mg total) by mouth every 6 (six) hours as needed for moderate pain.  albuterol (PROVENTIL HFA;VENTOLIN HFA) 108 (90 Base) MCG/ACT inhaler Inhale 2 puffs into the lungs every 6 (six) hours as needed for wheezing  or shortness of breath. (Patient not taking: Reported on 03/06/2020)   No facility-administered encounter medications on file as of 03/06/2020.    Follow-up: Return in about 1 month (around 04/06/2020).   Anabel Halon, MD

## 2020-03-06 NOTE — Assessment & Plan Note (Addendum)
On 11/19 for splenic laceration after a fall Has drain in place, serous drainage noted. Continue to follow up with General surgeon Received Meningococcal and flu vaccine in the hospital. Needs Pneumococcal vaccine, in the next visit.

## 2020-03-06 NOTE — Assessment & Plan Note (Signed)
Undergoing Rheumatology evaluation Tylenol PRN, cautious about liver effects in the presence of PBC

## 2020-03-06 NOTE — Assessment & Plan Note (Signed)
Multiple falls in the past, likely mechanical in nature No h/o syncope C/o polyarthralgia, physical deconditioning likely contributing to falls

## 2020-03-07 ENCOUNTER — Telehealth: Payer: Self-pay | Admitting: *Deleted

## 2020-03-07 NOTE — Telephone Encounter (Signed)
She should ask her General surgeon as this is more related to a surgical condition.

## 2020-03-07 NOTE — Telephone Encounter (Signed)
Pt advised with verbal understanding  °

## 2020-03-07 NOTE — Telephone Encounter (Signed)
Pt stated she forgot to ask you if you would fill her pain medication? Please advise

## 2020-03-08 ENCOUNTER — Telehealth: Payer: Self-pay | Admitting: Gastroenterology

## 2020-03-08 NOTE — Telephone Encounter (Signed)
Patient recently seen in the hospital for splenic laceration and underwent splenectomy and liver biopsy. She was diagnosed with portal vein thrombosis as well, now on Eliquis. She has follow up with hematology.   She needs a routine follow up with our office.

## 2020-03-09 NOTE — Progress Notes (Signed)
Office Visit Note  Patient: Kara Stewart             Date of Birth: June 05, 1975           MRN: [id number]             PCP: Lindell Spar, MD Referring: No ref. provider found Visit Date: 03/20/2020 Occupation: _0 @  Subjective:  Generalized pain.   History of Present Illness: Kara Stewart is a 44 y.o. female history of osteoarthritis and fibromyalgia syndrome.  She states on February 22, 2020 she tripped while she was climbing over the outer safety gate.  She states she fell on her left arm and fractured her left wrist.  She also had a splenic rupture and required splenectomy.  She continues to have discomfort in her cervical, lumbar and thoracic region.  She also has discomfort in her hands and feet and knee joints.  She states she falls quite easily.  She has had left-sided rib cage fracture in the past.  She states for the last 2 months she has been experiencing frequent headaches.  Activities of Daily Living:  Patient reports morning stiffness for 24 hours.   Patient Reports nocturnal pain.  Difficulty dressing/grooming: Reports Difficulty climbing stairs: Reports Difficulty getting out of chair: Reports Difficulty using hands for taps, buttons, cutlery, and/or writing: Reports  Review of Systems  Constitutional: Positive for fatigue.  HENT: Negative for mouth sores, mouth dryness and nose dryness.   Eyes: Negative for pain, itching and dryness.  Respiratory: Positive for shortness of breath. Negative for difficulty breathing.   Cardiovascular: Negative for chest pain and palpitations.  Gastrointestinal: Positive for constipation and diarrhea. Negative for blood in stool.  Endocrine: Negative for increased urination.  Genitourinary: Negative for difficulty urinating and painful urination.  Musculoskeletal: Positive for arthralgias, joint pain, myalgias, morning stiffness, muscle tenderness and myalgias. Negative for joint swelling.  Skin: Negative for color change,  rash and redness.  Allergic/Immunologic: Negative for susceptible to infections.  Neurological: Positive for dizziness, numbness, headaches and weakness. Negative for memory loss.  Hematological: Positive for bruising/bleeding tendency.  Psychiatric/Behavioral: Negative for confusion.    PMFS History:  Patient Active Problem List   Diagnosis Date Noted  . Portal vein thrombosis 03/12/2020  . Constipation 03/12/2020  . B12 deficiency 03/12/2020  . Folate deficiency 03/12/2020  . Encounter to establish care 03/06/2020  . Deep vein thrombosis of portal vein 03/06/2020  . 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 Macrocytic anemia 03/06/2020  . Closed fracture of wrist 03/06/2020  . Falls 03/06/2020  . Polyarthralgia 03/06/2020  . Splenic laceration 02/23/2020  . Abnormal LFTs 10/10/2018  . 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 Chronic liver disease 11/16/2017  . Alcoholic hepatitis with ascites 10/05/2016  . History of marijuana use 01/20/2015  . Abnormal CT scan, colon 01/08/2015  . Anxiety 11/29/2014  . History of colitis 11/29/2014  . RUQ pain 08/27/2014  . Thyroid nodule 01/11/2014  . SVT (supraventricular tachycardia) ([place]) 09/07/2012  . History of alcohol use 07/21/2012  . GERD (gastroesophageal reflux disease) 07/21/2012  . Fatty liver 10/28/2011    Past Medical History:  Diagnosis Date  . Acid reflux   . Alcoholic hepatitis 3/41/9379  . Alcoholic hepatitis without ascites   . Anxiety   . Asthma   . Colitis   . GERD (gastroesophageal reflux disease) 07/21/2012  . Hiatal hernia   . History of alcohol abuse 04/26/2015  . Hypertriglyceridemia   . Hypothyroid   . Iritis  frequent  . Ovarian cyst   . Panic attacks   . SVT (supraventricular tachycardia) ([place])    last issue 12/2017    Family History  Problem Relation Age of Onset  . Stomach cancer Paternal Grandfather        colon cancer  . Cancer Paternal Grandfather        throat and esophagus  . Breast cancer  Maternal Grandmother   . Cancer Maternal Grandmother        skin  . Anxiety disorder Maternal Grandmother   . Hypertension Mother   . Other Mother        fatty liver  . Hyperlipidemia Mother   . Liver disease Mother        fatty liver, does not drink.   . Sleep apnea Mother   . Other Father        varicose veins; stomach issues; hernia  . Hypertension Father   . Hyperlipidemia Father   . Cancer Father        prostate  . Arthritis Father        rheumatoid  . Neuropathy Father   . Rheum arthritis Father   . Thyroid disease Sister   . Hypertension Sister   . Other Brother        hernia  . Diabetes Maternal Grandfather   . Heart disease Maternal Grandfather   . Other Paternal Grandmother        hernia  . COPD Paternal Grandmother   . Diabetes Paternal Grandmother   . Healthy Daughter    Past Surgical History:  Procedure Laterality Date  . BIOPSY N/A 02/05/2015   Procedure: BIOPSY;  Surgeon: Danie Binder, MD;  Location: [facility];  Service: Endoscopy;  Laterality: N/A;  . COLONOSCOPY    . COLONOSCOPY WITH PROPOFOL N/A 02/05/2015   [id number] HH/mild diverticulosis  . ESOPHAGEAL DILATION N/A 11/21/2015   Procedure: ESOPHAGEAL DILATION;  Surgeon: Daneil Dolin, MD;  Location: [facility];  Service: Endoscopy;  Laterality: N/A;  . ESOPHAGOGASTRODUODENOSCOPY  11/06/2011   [name]: MILD Esophagitis/PATENT ESOPHAGEAL Stricture/  Moderate gastritis. Bx no.hpylori or celiac, +gastritis  . ESOPHAGOGASTRODUODENOSCOPY (EGD) WITH PROPOFOL N/A 03/20/2014   [name]: 1. Mild esophagitis & distal esophagela stricture. 2. small hiatal hernia 3. moderate non-erosive gastritis and mild duodenits  . ESOPHAGOGASTRODUODENOSCOPY (EGD) WITH PROPOFOL N/A 11/21/2015   Dr. Gala Romney: LA grade B esophagitis, MW tear likely source of hematemesis  . LAPAROSCOPIC TUBAL LIGATION Bilateral 09/14/2018   Procedure: LAPAROSCOPIC TUBAL LIGATION;  Surgeon: Emily Filbert, MD;  Location: [facility];  Service:  Gynecology;  Laterality: Bilateral;  . LIVER BIOPSY  02/23/2020   Procedure: OPEN LIVER BIOPSY;  Surgeon: Dwan Bolt, MD;  Location: [facility];  Service: General;;  . Azzie Almas DILATION N/A 03/20/2014   Procedure: SAVORY DILATION;  Surgeon: Danie Binder, MD;  Location: [facility];  Service: Endoscopy;  Laterality: N/A;  dilated with # 12.8, 14,15,16  . SPLENECTOMY, TOTAL N/A 02/23/2020   Procedure: SPLENECTOMY;  Surgeon: Dwan Bolt, MD;  Location: [facility];  Service: General;  Laterality: N/A;  . TOOTH EXTRACTION     Social History   Social History Narrative   Lives alone with a roommate. Dating and in safe relationship. Does not smoke. Denies drug use.    Previous MD: Deborah Chalk, NP ([place], [place])      Immunization History  Administered Date(s) Administered  . HiB (PRP-T) 03/03/2020  . Influenza,inj,Quad PF,6+ Mos 01/11/2018  . Influenza-Unspecified  01/12/2014  . Meningococcal Mcv4o 03/03/2020  . Pneumococcal Conjugate-13 03/03/2020  . Tdap 03/10/2018     Objective: Vital Signs: BP 117/71 (BP Location: Left Arm, Patient Position: Sitting, Cuff Size: Normal)   Pulse 96   Resp 15   Ht 5' 3.5" (1.613 m)   Wt 157 lb 3.2 oz (71.3 kg)   LMP 02/23/2020 (Exact Date)   BMI 27.41 kg/m    Physical Exam Vitals and nursing note reviewed.  Constitutional:      Appearance: She is well-developed and well-nourished.  HENT:     Head: Normocephalic and atraumatic.  Eyes:     Extraocular Movements: EOM normal.     Conjunctiva/sclera: Conjunctivae normal.  Cardiovascular:     Rate and Rhythm: Normal rate and regular rhythm.     Pulses: Intact distal pulses.     Heart sounds: Normal heart sounds.  Pulmonary:     Effort: Pulmonary effort is normal.     Breath sounds: Normal breath sounds.  Abdominal:     General: Bowel sounds are normal.     Palpations: Abdomen is soft.  Musculoskeletal:     Cervical back: Normal range of motion.  Lymphadenopathy:     Cervical: No cervical adenopathy.   Skin:    General: Skin is warm and dry.     Capillary Refill: Capillary refill takes less than 2 seconds.  Neurological:     Mental Status: She is alert and oriented to person, place, and time.  Psychiatric:        Mood and Affect: Mood and affect normal.        Behavior: Behavior normal.      Musculoskeletal Exam: C-spine was in good range of motion.  She did not have any point tenderness over the lumbar or thoracic region.  Although she had discomfort with range of motion of her lumbar spine.  Shoulder joints, elbow joints, right wrist joint, MCPs PIPs and DIPs with good range of motion.  Left wrist joint was in a splint.  She has bilateral DIP thickening.  Hip joints and knee joints with good range of motion.  She had no tenderness over ankles or MTPs.  CDAI Exam: CDAI Score: -- Patient Global: --; Provider Global: -- Swollen: --; Tender: -- Joint Exam 03/20/2020   No joint exam has been documented for this visit   There is currently no information documented on the homunculus. Go to the Rheumatology activity and complete the homunculus joint exam.  Investigation: No additional findings.  Imaging: DG Forearm Left  Result Date: 02/23/2020 CLINICAL DATA:  Fall yesterday due to syncope EXAM: LEFT FOREARM - 2 VIEW COMPARISON:  None. FINDINGS: Distal radius fracture with ventral sided impaction and fragmentation. Intact and located proximal forearm. Negative for elbow joint effusion. Located radiocarpal joint. IMPRESSION: Impacted distal radius fracture. Electronically Signed   By: Monte Fantasia M.D.   On: 02/23/2020 09:24   DG Wrist Complete Left  Result Date: 03/12/2020 X-ray of the left wrist were obtained in clinic today and demonstrates a distal radius fracture, with a primary volar component.  Increased volar tilt.  Loss of radial tilt and inclination due to the impaction of the fracture.  Impression: Impacted left distal radius fracture, with increased volar tilt as a  result of a displaced volar fracture fragment.  CT Angio Chest PE W and/or Wo Contrast  Result Date: 02/23/2020 CLINICAL DATA:  [place] last evening. Left-sided chest pain. Syncopal episode. EXAM: CT ANGIOGRAPHY CHEST CT ABDOMEN AND PELVIS WITH  CONTRAST TECHNIQUE: Multidetector CT imaging of the chest was performed using the standard protocol during bolus administration of intravenous contrast. Multiplanar CT image reconstructions and MIPs were obtained to evaluate the vascular anatomy. Multidetector CT imaging of the abdomen and pelvis was performed using the standard protocol during bolus administration of intravenous contrast. CONTRAST:  138m OMNIPAQUE IOHEXOL 350 MG/ML SOLN COMPARISON:  None. FINDINGS: CTA CHEST FINDINGS Cardiovascular: The heart is normal in size. No pericardial effusion. The aorta is normal in caliber. No dissection. No atherosclerotic calcifications. The branch vessels are patent. No coronary artery calcifications. The pulmonary arteries appear normal. No filling defects to suggest pulmonary embolism. Mediastinum/Nodes: No mediastinal or hilar mass or adenopathy or hematoma. The esophagus is grossly normal. Lungs/Pleura: No acute pulmonary findings. No pulmonary contusion or pneumothorax. No pleural effusion. Musculoskeletal: Musculoskeletal no rib, sternal or thoracic vertebral body fractures are identified. There is a remote healed left lateral rib fracture noted. Review of the MIP images confirms the above findings. CT ABDOMEN and PELVIS FINDINGS Hepatobiliary: No acute hepatic injury is identified. No hepatic lesions. There is diffuse fatty infiltration of the liver noted. The portal and hepatic veins are patent. The gallbladder is unremarkable. No common bile duct dilatation. Pancreas: No mass, inflammation or ductal dilatation. No acute injury. Spleen: Significant splenic injury is demonstrated. Multiple large lacerations extending to the splenic hilum with areas of active  bleeding/extravasation suspected. There is a large subcapsular hematoma along with moderate abdominal/pelvic hemoperitoneum. Findings consistent with a grade 4 splenic injury. Adrenals/Urinary Tract: The adrenal glands and kidneys are unremarkable. No acute renal injury or perinephric fluid collection. Stomach/Bowel: The stomach, duodenum, small bowel and colon are unremarkable. No acute inflammatory changes, mass lesions or obstructive findings. Vascular/Lymphatic: The aorta and branch vessels are patent. The major venous structures are patent. No mesenteric or retroperitoneal mass or adenopathy. Reproductive: Reproductive the uterus and ovaries are unremarkable. Tubal ligation clips are noted bilaterally. Other: Moderate volume abdominal/pelvic hemoperitoneum Musculoskeletal: The bony pelvis is intact. No pelvic fractures. The pubic symphysis and SI joints are maintained. Both hips are normally located. The lumbar vertebral bodies are normally aligned. No fractures are identified. Review of the MIP images confirms the above findings. IMPRESSION: 1. Grade 4 splenic injury with multiple large lacerations extending to the splenic hilum, small vascular injuries with contrast extravasation and moderate volume abdominal/pelvic hemoperitoneum. 2. No CT findings for pulmonary embolism. 3. No acute pulmonary findings or pneumothorax. 4. No acute bony findings. 5. Diffuse fatty infiltration of the liver. These results were called by telephone at the time of interpretation on 02/23/2020 at 2:18 pm to provider DVeryl Speak, who verbally acknowledged these results. Electronically Signed   By: PMarijo SanesM.D.   On: 02/23/2020 14:18   CT ABDOMEN PELVIS W CONTRAST  Result Date: 03/16/2020 CLINICAL DATA:  Initial evaluation for acute abdominal pain, intra-abdominal infection suspected. History of recent splenectomy. EXAM: CT ABDOMEN AND PELVIS WITH CONTRAST TECHNIQUE: Multidetector CT imaging of the abdomen and pelvis was  performed using the standard protocol following bolus administration of intravenous contrast. CONTRAST:  1064mOMNIPAQUE IOHEXOL 300 MG/ML  SOLN COMPARISON:  Prior CT from 02/23/2020. FINDINGS: Lower chest: Minimal scattered subsegmental atelectatic changes seen within the visualized lung bases. Visualized lungs are otherwise clear. Hepatobiliary: Liver demonstrates a normal contrast enhanced appearance. Gallbladder mildly contracted without internal stones or sludge. No biliary dilatation. Pancreas: Pancreas within normal limits. Spleen: Postoperative changes from recent splenectomy. Scattered postoperative stranding present within the ventral omental fat subjacent to  the midline incision. 7 mm nodular density within this region could reflect a small focus of blood products or fat necrosis, of doubtful significance (series 2, image 33). No other collection within this region. At the left upper quadrant there is a small irregular collection measuring 2.0 x 1.3 x 1.7 cm (series 2, image 16). No significant surrounding inflammatory changes. Finding favored to reflect a small residual subacute hematoma or possibly postoperative seroma. No significant surrounding inflammatory changes to suggest abscess. No other intra-abdominal abscess or other collection elsewhere within the abdomen and pelvis. Previously seen hemoperitoneum has largely resolved. Adrenals/Urinary Tract: Adrenal glands are normal. Kidneys equal size with symmetric enhancement. No nephrolithiasis, hydronephrosis or focal enhancing renal mass. No hydroureter. Partially distended bladder within normal limits. Stomach/Bowel: Mild postoperative stranding adjacent to the gastric fundus related to prior splenectomy. Stomach otherwise unremarkable. No evidence for bowel obstruction. Normal appendix. Single colonic diverticulum noted at the transverse colon. No acute inflammatory changes about the bowels. Vascular/Lymphatic: Normal intravascular enhancement seen  throughout the intra-abdominal aorta. Mesenteric vessels patent proximally. No adenopathy. Reproductive: Uterus within normal limits. Sequelae of prior tubal ligation noted. 4.4 cm simple left ovarian cyst noted. A smaller 2.2 cm simple appearing right ovarian cyst. Other: No free air or fluid. Musculoskeletal: No acute osseous finding. No discrete or worrisome osseous lesions. Multilevel facet arthropathy noted within the lower lumbar spine. IMPRESSION: 1. Postoperative changes from recent splenectomy. Small 2.0 x 1.3 x 1.7 cm irregular collection at the left upper quadrant as above. Finding favored to reflect a small residual subacute hematoma or possibly postoperative seroma. No significant surrounding inflammatory changes to suggest abscess. No other evidence for acute intra-abdominal infection. 2. No other acute intra-abdominal or pelvic process. 3. Bilateral simple appearing ovarian cysts as above, largest measuring 4.4 cm on the left. Electronically Signed   By: Jeannine Boga M.D.   On: 03/16/2020 01:08   CT ABDOMEN PELVIS W CONTRAST  Result Date: 02/26/2020 CLINICAL DATA:  44 year old female with abdominal trauma. 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 EXAM: CT ABDOMEN AND PELVIS WITH CONTRAST TECHNIQUE: Multidetector CT imaging of the abdomen and pelvis was performed using the standard protocol following bolus administration of intravenous contrast. CONTRAST:  126m OMNIPAQUE IOHEXOL 300 MG/ML  SOLN COMPARISON:  CT abdomen pelvis dated 02/23/2020. FINDINGS: Lower chest: There are trace bilateral pleural effusions with small bibasilar atelectasis. No intra-abdominal free air. Small perihepatic free fluid. Hepatobiliary: There is fatty infiltration of the liver. There is slight irregularity of the liver contour which may represent early changes of cirrhosis. Clinical correlation is recommended. No intrahepatic biliary ductal dilatation. Layering high attenuating content within the  gallbladder may represent sludge, small stones, or vicarious excretion of intravenous contrast. No pericholecystic fluid or evidence of acute cholecystitis by CT. Pancreas: There is edema and fluid surrounding the pancreas which may be related to postsurgical changes. Correlation with pancreatic enzymes recommended to exclude acute pancreatitis. Spleen: Status post splenectomy. There is a small amount of fluid in the surgical bed, likely post operative seroma. There is a drainage catheter with tip in the left upper abdomen in the region of the fluid. Adrenals/Urinary Tract: The adrenal glands unremarkable. There is no hydronephrosis on either side. There is symmetric enhancement and excretion of contrast by both kidneys. The visualized ureters and urinary bladder appear unremarkable. Stomach/Bowel: There is no bowel obstruction or active inflammation. The appendix is normal. Vascular/Lymphatic: The abdominal aorta and IVC unremarkable. There is thrombus within the main portal vein and central intrahepatic  portal venous branches. No portal venous gas. Postsurgical changes of proximal splenic vein. There is no adenopathy. Reproductive: The uterus is anteverted and grossly unremarkable. Bilateral tubal ligation clips. There is a 3 cm right ovarian cyst. Other: Midline vertical anterior abdominal wall surgical incision and cutaneous clips. No fluid collection. Musculoskeletal: No acute or significant osseous findings. IMPRESSION: 1. Thrombus within the main portal vein and central intrahepatic portal venous branches. 2. Status post splenectomy. Small amount of fluid in the surgical bed, likely post operative seroma. 3. Edema and fluid surrounding the pancreas may be related to postsurgical changes. Correlation with pancreatic enzymes recommended to exclude acute pancreatitis. 4. Fatty liver with possible early changes of cirrhosis. Clinical correlation is recommended. 5. A 3 cm right ovarian cyst. 6. Trace bilateral  pleural effusions with small bibasilar atelectasis. These results were called by telephone at the time of interpretation on 02/26/2020 at 5:46 pm to Dr. Kieth Brightly, who verbally acknowledged these results. Electronically Signed   By: Anner Crete M.D.   On: 02/26/2020 17:59   CT ABDOMEN PELVIS W CONTRAST  Result Date: 02/23/2020 CLINICAL DATA:  [place] last evening. Left-sided chest pain. Syncopal episode. EXAM: CT ANGIOGRAPHY CHEST CT ABDOMEN AND PELVIS WITH CONTRAST TECHNIQUE: Multidetector CT imaging of the chest was performed using the standard protocol during bolus administration of intravenous contrast. Multiplanar CT image reconstructions and MIPs were obtained to evaluate the vascular anatomy. Multidetector CT imaging of the abdomen and pelvis was performed using the standard protocol during bolus administration of intravenous contrast. CONTRAST:  129m OMNIPAQUE IOHEXOL 350 MG/ML SOLN COMPARISON:  None. FINDINGS: CTA CHEST FINDINGS Cardiovascular: The heart is normal in size. No pericardial effusion. The aorta is normal in caliber. No dissection. No atherosclerotic calcifications. The branch vessels are patent. No coronary artery calcifications. The pulmonary arteries appear normal. No filling defects to suggest pulmonary embolism. Mediastinum/Nodes: No mediastinal or hilar mass or adenopathy or hematoma. The esophagus is grossly normal. Lungs/Pleura: No acute pulmonary findings. No pulmonary contusion or pneumothorax. No pleural effusion. Musculoskeletal: Musculoskeletal no rib, sternal or thoracic vertebral body fractures are identified. There is a remote healed left lateral rib fracture noted. Review of the MIP images confirms the above findings. CT ABDOMEN and PELVIS FINDINGS Hepatobiliary: No acute hepatic injury is identified. No hepatic lesions. There is diffuse fatty infiltration of the liver noted. The portal and hepatic veins are patent. The gallbladder is unremarkable. No common bile duct  dilatation. Pancreas: No mass, inflammation or ductal dilatation. No acute injury. Spleen: Significant splenic injury is demonstrated. Multiple large lacerations extending to the splenic hilum with areas of active bleeding/extravasation suspected. There is a large subcapsular hematoma along with moderate abdominal/pelvic hemoperitoneum. Findings consistent with a grade 4 splenic injury. Adrenals/Urinary Tract: The adrenal glands and kidneys are unremarkable. No acute renal injury or perinephric fluid collection. Stomach/Bowel: The stomach, duodenum, small bowel and colon are unremarkable. No acute inflammatory changes, mass lesions or obstructive findings. Vascular/Lymphatic: The aorta and branch vessels are patent. The major venous structures are patent. No mesenteric or retroperitoneal mass or adenopathy. Reproductive: Reproductive the uterus and ovaries are unremarkable. Tubal ligation clips are noted bilaterally. Other: Moderate volume abdominal/pelvic hemoperitoneum Musculoskeletal: The bony pelvis is intact. No pelvic fractures. The pubic symphysis and SI joints are maintained. Both hips are normally located. The lumbar vertebral bodies are normally aligned. No fractures are identified. Review of the MIP images confirms the above findings. IMPRESSION: 1. Grade 4 splenic injury with multiple large lacerations extending to the splenic  hilum, small vascular injuries with contrast extravasation and moderate volume abdominal/pelvic hemoperitoneum. 2. No CT findings for pulmonary embolism. 3. No acute pulmonary findings or pneumothorax. 4. No acute bony findings. 5. Diffuse fatty infiltration of the liver. These results were called by telephone at the time of interpretation on 02/23/2020 at 2:18 pm to provider Veryl Speak , who verbally acknowledged these results. Electronically Signed   By: Marijo Sanes M.D.   On: 02/23/2020 14:18   DG CHEST PORT 1 VIEW  Result Date: 03/01/2020 CLINICAL DATA:  Shortness of  breath. EXAM: PORTABLE CHEST 1 VIEW COMPARISON:  February 19, 2016. FINDINGS: The heart size and mediastinal contours are within normal limits. Both lungs are clear. No visible pleural effusions or pneumothorax. The visualized skeletal structures are unremarkable. IMPRESSION: No active disease. Electronically Signed   By: Margaretha Sheffield MD   On: 03/01/2020 09:10   DG Abd Portable 1V  Result Date: 02/26/2020 CLINICAL DATA:  Postoperative ileus.  Abdominal distension. EXAM: PORTABLE ABDOMEN - 1 VIEW COMPARISON:  January 22, 2015. FINDINGS: Surgical drain is seen in the left side of the abdomen with the distal tip in the expected position of the left upper quadrant. Sternotomy wires are noted. No colonic dilatation is noted. Mildly dilated small bowel loops are noted which most likely represents postoperative ileus. IMPRESSION: Mildly dilated small bowel loops are noted which most likely represents postoperative ileus. Follow-up radiographs are recommended. Electronically Signed   By: Marijo Conception M.D.   On: 02/26/2020 09:12   XR Foot 2 Views Left  Result Date: 02/19/2020 PIP and DIP narrowing was noted.  No MTP, intertarsal or tibiotalar joint space narrowing was noted.  No erosive changes were noted. Impression: Mild osteoarthritic changes were noted.  XR Foot 2 Views Right  Result Date: 02/19/2020 PIP and DIP narrowing was noted.  No MTP, intertarsal or tibiotalar joint space narrowing was noted.  No erosive changes were noted. Impression: Mild osteoarthritic changes were noted.  XR Hand 2 View Left  Result Date: 02/19/2020 Mild PIP narrowing was noted.  No MCP, intercarpal or radiocarpal joint space narrowing was noted.  No erosive changes were noted. Impression: These findings are consistent with early osteoarthritic changes.  XR Hand 2 View Right  Result Date: 02/19/2020 Mild PIP narrowing was noted.  No MCP, intercarpal or radiocarpal joint space narrowing was noted.  No erosive  changes were noted. Impression: These findings are consistent with early osteoarthritic changes.  XR KNEE 3 VIEW LEFT  Result Date: 02/19/2020 No medial or lateral compartment narrowing was noted.  Mild patellofemoral narrowing was noted.  No chondrocalcinosis was noted. 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  XR KNEE 3 VIEW RIGHT  Result Date: 02/19/2020 No medial lateral compartment narrowing was noted.  Patellofemoral narrowing was noted.  No chondrocalcinosis was noted. Impression: Unremarkable x-ray of the knee joint.  Patient.x-ray of the cervical spine from her chiropractor's office today which are reviewed with her.  It did show mild degenerative changes no significant disc space narrowing or facet joint arthropathy.  Thoracic spine AP view showed mild levoscoliosis no significant disc space narrowing was noted.    Lumbar spine AP view most the lumbar spine was obscured hip joint showed no significant narrowing SI joints were not visualized.   Recent Labs: Lab Results  Component Value Date   WBC 9.9 03/15/2020   HGB 11.6 (L) 03/15/2020   PLT 848 (H) 03/15/2020   NA 137 03/15/2020  K 3.4 (L) 03/15/2020   CL 103 03/15/2020   CO2 24 03/15/2020   GLUCOSE 90 03/15/2020   BUN 6 03/15/2020   CREATININE 0.54 03/15/2020   BILITOT 0.5 03/15/2020   ALKPHOS 100 03/15/2020   AST 26 03/15/2020   ALT 17 03/15/2020   PROT 6.9 03/15/2020   ALBUMIN 3.4 (L) 03/15/2020   CALCIUM 9.2 03/15/2020   GFRAA >60 05/03/2019   February 19, 2020 SPEP showed alpha-1 globulin increase, CK 44, ESR 2, RF negative, anti-CCP negative  05/03/19: ANA-, mitochondrial M2 Ab 41.8, F-actin IgG 6, tissue transgluatminase ab-, IgG 789, IgA 227, IgM 187.   Speciality Comments: No specialty comments available.  Procedures:  No procedures performed Allergies: Penicillins and Lidocaine   Assessment / Plan:     Visit Diagnoses: Polyarthralgia - No synovitis  was noted on the examination.  All autoimmune work-up negative.  All the labs were discussed with the patient at length.  Primary osteoarthritis of both hands - Mild osteoarthritic changes are noted on the clinical radiographic examination.  Patient gives history of severe pain and joint swelling.  No synovitis was noted.  Chondromalacia of both patellae - History of bilateral knee joint pain.  X-ray findings were consistent with chondromalacia.  She would benefit from lower extremity exercises.  I offered physical therapy but she would like to wait until she does initial physical therapy which she needs postoperatively after splenectomy.  Primary osteoarthritis of both feet - Mild osteoarthritic changes were noted on the clinical examination and radiographic findings.  Chronic SI joint pain - X-rays were unremarkable.  Neck pain - She was referred to physical therapy.  She had been evaluated by chiropractor.  She brought x-rays from the chiropractor's office which were unremarkable except for mild degenerative changes.  Chronic midline low back pain without sciatica - Followed by chiropractor.  According to patient she has degenerative disc disease and pinched nerve.  I did not have lumbar spine x-rays available to review.  Fibromyalgia - History of generalized pain, positive tender points and hyperalgesia.  She would benefit from water aerobics and good sleep hygiene.  Other fatigue - History of chronic fatigue for many years.  Other insomnia-good sleep hygiene was discussed.  Vitamin D deficiency  SVT (supraventricular tachycardia) (HCC)  Primary biliary cirrhosis (HCC)  History of colitis  History of anxiety  Thyroid nodule  History of alcohol use  History of marijuana use  Family history of rheumatoid arthritis  History of headaches-she gives history of increased headaches in the last 2 months.  She also has history of frequent falls.  I will refer her to  neurology.  Frequent falls-patient has been experiencing frequent falls.  She had a recent fall with a left wrist fracture and a splenic rupture requiring splenectomy.  I offered referral to physical therapy but she declined as she has to get some other physical therapy for core strengthening.  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  Deep vein thrombosis of portal vein  Orders: Orders Placed This Encounter  Procedures  . Ambulatory referral to Neurology   No orders of the defined types were placed in this encounter.    Follow-Up Instructions: Return if symptoms worsen or fail to improve, for Osteoarthritis.   Bo Merino, MD  Note - This record has been created using Editor, commissioning.  Chart creation errors have been sought, but may not always  have been located. Such creation errors do not reflect on  the standard  of medical care.

## 2020-03-10 NOTE — ED Provider Notes (Addendum)
[facility]   [id number] 02/23/20 Arrival Time: 0804   Chief Complaint  Patient presents with  . Fall  . Chest Pain  . Arm Pain     SUBJECTIVE: History from: patient.  Kara Stewart is a 44 y.o. female .who presented to the urgent care for complaint that she fell last night here.  Denies any precipitating event.  Localized pain to the bilateral costovertebral area, left forearm and bruising to face.  Has tried OTC medication without relief.  Reports worsening symptoms with ROM.  Denies similar symptoms in the past.  Denies chills, fever, nausea, vomiting, diarrhea.  ROS: As per HPI.  All other pertinent ROS negative.     Past Medical History:  Diagnosis Date  . Acid reflux   . 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 Alcoholic hepatitis without ascites   . Anxiety   . Asthma   . Colitis   . GERD (gastroesophageal reflux disease) 07/21/2012  . Hiatal hernia   . History of alcohol abuse 04/26/2015  . Hypertriglyceridemia   . Hypothyroid   . Iritis    frequent  . Ovarian cyst   . Panic attacks   . SVT (supraventricular tachycardia) (HCC)    last issue 12/2017   Past Surgical History:  Procedure Laterality Date  . BIOPSY N/A 02/05/2015   Procedure: BIOPSY;  Surgeon: West Bali, MD;  Location: [facility];  Service: Endoscopy;  Laterality: N/A;  . COLONOSCOPY    . COLONOSCOPY WITH PROPOFOL N/A 02/05/2015   AQT:MAUQJ HH/mild diverticulosis  . ESOPHAGEAL DILATION N/A 11/21/2015   Procedure: ESOPHAGEAL DILATION;  Surgeon: Corbin Ade, MD;  Location: [facility];  Service: Endoscopy;  Laterality: N/A;  . ESOPHAGOGASTRODUODENOSCOPY  11/06/2011   [name]: MILD Esophagitis/PATENT ESOPHAGEAL Stricture/  Moderate gastritis. Bx no.hpylori or celiac, +gastritis  . ESOPHAGOGASTRODUODENOSCOPY (EGD) WITH PROPOFOL N/A 03/20/2014   [name]: 1. Mild esophagitis & distal esophagela stricture. 2. small hiatal hernia 3. moderate non-erosive gastritis and mild duodenits  .  ESOPHAGOGASTRODUODENOSCOPY (EGD) WITH PROPOFOL N/A 11/21/2015   Dr. Jena Gauss: LA grade B esophagitis, MW tear likely source of hematemesis  . LAPAROSCOPIC TUBAL LIGATION Bilateral 09/14/2018   Procedure: LAPAROSCOPIC TUBAL LIGATION;  Surgeon: Allie Bossier, MD;  Location: [facility];  Service: Gynecology;  Laterality: Bilateral;  . LIVER BIOPSY  02/23/2020   Procedure: OPEN LIVER BIOPSY;  Surgeon: Fritzi Mandes, MD;  Location: [facility] OR;  Service: General;;  . Gaspar Bidding DILATION N/A 03/20/2014   Procedure: SAVORY DILATION;  Surgeon: West Bali, MD;  Location: [facility];  Service: Endoscopy;  Laterality: N/A;  dilated with # 12.8, 14,15,16  . SPLENECTOMY, TOTAL N/A 02/23/2020   Procedure: SPLENECTOMY;  Surgeon: Fritzi Mandes, MD;  Location: [facility] OR;  Service: General;  Laterality: N/A;  . TOOTH EXTRACTION     Allergies  Allergen Reactions  . Penicillins Anaphylaxis    Has patient had a PCN reaction causing immediate rash, facial/tongue/throat swelling, SOB or lightheadedness with hypotension: yes Has patient had a PCN reaction causing severe rash involving mucus membranes or skin necrosis: No Has patient had a PCN reaction that required hospitalization Yes Has patient had a PCN reaction occurring within the last 10 years: No If all of the above answers are "NO", then may proceed with Cephalosporin use.   . Lidocaine Other (See Comments)    Can use topical lidocaine but if ingested causes Hallucinations.   No current facility-administered medications on file prior to encounter.   Current Outpatient  Medications on File Prior to Encounter  Medication Sig Dispense Refill  . albuterol (PROVENTIL HFA;VENTOLIN HFA) 108 (90 Base) MCG/ACT inhaler Inhale 2 puffs into the lungs every 6 (six) hours as needed for wheezing or shortness of breath. (Patient not taking: Reported on 03/06/2020) 1 Inhaler 0  . Cholecalciferol (VITAMIN D3 PO) Take 1 tablet by mouth daily.     . Cyanocobalamin (VITAMIN  B-12 IJ) Inject as directed every 30 (thirty) days.    . folic acid (FOLVITE) 1 MG tablet TAKE 1 TABLET BY MOUTH EVERY DAY (Patient taking differently: Take 1 mg by mouth daily. ) 90 tablet 0  . MILK THISTLE PO Take 1 tablet by mouth daily.     . ursodiol (ACTIGALL) 500 MG tablet TAKE 1 TABLET BY MOUTH TWICE A DAY (Patient taking differently: Take 500 mg by mouth in the morning and at bedtime. ) 60 tablet 3   Social History   Socioeconomic History  . Marital status: Divorced    Spouse name: Not on file  . Number of children: 0  . Years of education: Not on file  . Highest education level: Not on file  Occupational History  . Occupation: was at [employer] but not working now  Tobacco Use  . Smoking status: Current Some Day Smoker    Types: Cigarettes    Last attempt to quit: 12/22/2017    Years since quitting: 2.2  . Smokeless tobacco: Never Used  . Tobacco comment: "might smoke one on a bad day"  Vaping Use  . Vaping Use: Never used  Substance and Sexual Activity  . Alcohol use: No    Alcohol/week: 0.0 standard drinks    Comment: Sober since 2017  . Drug use: Never  . Sexual activity: Yes    Birth control/protection: Condom  Other Topics Concern  . Not on file  Social History Narrative   Lives alone with a roommate. Dating and in safe relationship. Does not smoke. Denies drug use.    Previous MD: Phill Mutter, NP ([facility], [place])      Social Determinants of Health   Financial Resource Strain:   . Difficulty of Paying Living Expenses: Not on file  Food Insecurity:   . Worried About Programme researcher, broadcasting/film/video in the Last Year: Not on file  . Ran Out of Food in the Last Year: Not on file  Transportation Needs:   . Lack of Transportation (Medical): Not on file  . Lack of Transportation (Non-Medical): Not on file  Physical Activity:   . Days of Exercise per Week: Not on file  . Minutes of Exercise per Session: Not on file  Stress:   . Feeling of Stress : Not on file  Social Connections:     . Frequency of Communication with Friends and Family: Not on file  . Frequency of Social Gatherings with Friends and Family: Not on file  . Attends Religious Services: Not on file  . Active Member of Clubs or Organizations: Not on file  . Attends Banker Meetings: Not on file  . Marital Status: Not on file  Intimate Partner Violence:   . Fear of Current or Ex-Partner: Not on file  . Emotionally Abused: Not on file  . Physically Abused: Not on file  . Sexually Abused: Not on file   Family History  Problem Relation Age of Onset  . Stomach cancer Paternal Grandfather        colon cancer  . Cancer Paternal Grandfather  throat and esophagus  . Breast cancer Maternal Grandmother   . Cancer Maternal Grandmother        skin  . Anxiety disorder Maternal Grandmother   . Hypertension Mother   . Other Mother        fatty liver  . Hyperlipidemia Mother   . Liver disease Mother        fatty liver, does not drink.   . Sleep apnea Mother   . Other Father        varicose veins; stomach issues; hernia  . Hypertension Father   . Hyperlipidemia Father   . Cancer Father        prostate  . Arthritis Father        rheumatoid  . Neuropathy Father   . Rheum arthritis Father   . Thyroid disease Sister   . Hypertension Sister   . Other Brother        hernia  . Diabetes Maternal Grandfather   . Heart disease Maternal Grandfather   . Other Paternal Grandmother        hernia  . COPD Paternal Grandmother   . Diabetes Paternal Grandmother   . Healthy Daughter     OBJECTIVE:  Vitals:   02/23/20 0824  BP: 92/61  Pulse: (!) 132  Resp: 20  Temp: 98.3 F (36.8 C)  SpO2: 98%     Physical Exam Vitals and nursing note reviewed.  Constitutional:      General: She is not in acute distress.    Appearance: Normal appearance. She is normal weight. She is not ill-appearing, toxic-appearing or diaphoretic.  HENT:     Head: Normocephalic.  Cardiovascular:     Rate and  Rhythm: Normal rate and regular rhythm.     Pulses: Normal pulses.     Heart sounds: Normal heart sounds. No murmur heard.  No friction rub. No gallop.   Pulmonary:     Effort: Pulmonary effort is normal. No respiratory distress.     Breath sounds: Normal breath sounds. No stridor. No wheezing, rhonchi or rales.  Chest:     Chest wall: No tenderness.  Musculoskeletal:        General: Tenderness present.     Right forearm: Normal.     Left forearm: Swelling and tenderness present.     Comments: deformity on the left distal radius motor.  Swelling and tenderness present.  Limited range of motion.  Neurovascular status intact.  Neurological:     Mental Status: She is alert and oriented to person, place, and time.     LABS:  No results found for this or any previous visit (from the past 24 hour(s)).    ASSESSMENT & PLAN:  1. Fall, initial encounter   2. Closed fracture of distal end of left radius, unspecified fracture morphology, initial encounter   3. Loss of consciousness (HCC)     No orders of the defined types were placed in this encounter.  EMS was called and patient was transported to ER as she lost consciousness during exam.  It  Has lasted 10 to 15 seconds and she regained consciousness.  POCT CBGs was 186.  O2 2 L was added.  Reviewed expectations re: course of current medical issues. Questions answered. Outlined signs and symptoms indicating need for more acute intervention. Patient verbalized understanding. After Visit Summary given.         Durward Parcel, FNP 03/10/20 1647    Durward Parcel, FNP 03/10/20 1649

## 2020-03-12 ENCOUNTER — Ambulatory Visit (INDEPENDENT_AMBULATORY_CARE_PROVIDER_SITE_OTHER): Payer: Medicaid Other | Admitting: Orthopedic Surgery

## 2020-03-12 ENCOUNTER — Other Ambulatory Visit: Payer: Self-pay

## 2020-03-12 ENCOUNTER — Encounter: Payer: Self-pay | Admitting: Gastroenterology

## 2020-03-12 ENCOUNTER — Telehealth: Payer: Self-pay | Admitting: Internal Medicine

## 2020-03-12 ENCOUNTER — Ambulatory Visit: Payer: Medicaid Other

## 2020-03-12 ENCOUNTER — Encounter: Payer: Self-pay | Admitting: Orthopedic Surgery

## 2020-03-12 ENCOUNTER — Ambulatory Visit: Payer: Medicaid Other | Admitting: Gastroenterology

## 2020-03-12 VITALS — BP 121/74 | HR 89 | Ht 63.0 in | Wt 156.0 lb

## 2020-03-12 VITALS — BP 119/80 | HR 111 | Temp 97.0°F | Ht 63.5 in | Wt 157.2 lb

## 2020-03-12 DIAGNOSIS — I81 Portal vein thrombosis: Secondary | ICD-10-CM | POA: Diagnosis not present

## 2020-03-12 DIAGNOSIS — M25532 Pain in left wrist: Secondary | ICD-10-CM

## 2020-03-12 DIAGNOSIS — S52502A Unspecified fracture of the lower end of left radius, initial encounter for closed fracture: Secondary | ICD-10-CM

## 2020-03-12 DIAGNOSIS — E538 Deficiency of other specified B group vitamins: Secondary | ICD-10-CM | POA: Diagnosis not present

## 2020-03-12 DIAGNOSIS — W19XXXA Unspecified fall, initial encounter: Secondary | ICD-10-CM

## 2020-03-12 DIAGNOSIS — K743 Primary biliary cirrhosis: Secondary | ICD-10-CM

## 2020-03-12 DIAGNOSIS — K5900 Constipation, unspecified: Secondary | ICD-10-CM | POA: Diagnosis not present

## 2020-03-12 MED ORDER — OXYCODONE HCL 5 MG PO TABS
5.0000 mg | ORAL_TABLET | ORAL | 0 refills | Status: AC | PRN
Start: 2020-03-12 — End: 2020-03-17

## 2020-03-12 NOTE — Patient Instructions (Signed)

## 2020-03-12 NOTE — Telephone Encounter (Signed)
Patient  was here this morning and said that she was supposed to have two prescriptions sent to [facility] and they have not gotten them yet

## 2020-03-12 NOTE — Progress Notes (Signed)
Primary Care Physician: Anabel Halon, MD  Primary Gastroenterologist:  Hennie Duos. Marletta Lor, DO   Chief Complaint  Patient presents with  . chronic liver disease    f/u. Recently in hospital and had spleenectomy 11/19    HPI: Kara Stewart is a 44 y.o. female here for follow up chronic liver disease. Last seen in 07/2019.   Recent hospitalization from November 19 through November 28.  Patient presented with abdominal pain and left wrist pain which occurred after a minor fall at home.  Patient reports couple of falls in the 1 week prior.  The last time she fell trying to step over a baby gate.  She initially presented to the urgent care and was noted to have tachycardia and be hypotensive, had a syncopal episode. EMS was activated. Sent to the ED for further evaluation.  Initial hemoglobin 12.9.  Work-up significant for grade 4 splenic laceration, left impacted distal radius fracture. Hemoglobin got as low as 8.2 during admission. She received 3 units of packed red blood cells. Hemoglobin at discharge was 8.6.  Patient underwent emergent exploratory laparotomy on November 19 with findings of hemoperitoneum and grade 5 splenic laceration with active bleeding in the splenic hilum. Nodular, steatotic liver suspicious for cirrhosis. She underwent splenectomy and liver biopsy. Liver biopsy results on day 3 postop to complain of worsening abdominal pain. Repeat CT imaging performed and found to have thrombus within the main portal vein and central intrahepatic portal venous branches. Heparin drip was started hematology was consulted with, they felt like chronic underlying hypercoagulable disorder less likely.  She received postsplenectomy vaccines prior to discharge (ACTHIB, MENVEO, PREVNAR 13). Plans for pneumococcal vaccine 8 weeks after initial postsplenectomy vaccines. She was discharged home on Eliquis and thrombosis.  Patient slowly recovering from surgery. Still has a lot of pressure in  upper abdomen. Can't wear a bra due to distention. Feels more constipated due to pain medication. Hasn't had good bm in over a week. Started Miralax bid three days ago and had small stool this morning. No melena, brbpr. Continues to have chronic rlq pain. She thinks it may be referred from her back. Not related to meals or BMs. She has been seeing chiropractor and states she has a pinched nerve in lower back and upper back. Saw Dr. Corliss Skains recently for joint/muscle pain. No heartburn or vomiting.    SURGICAL PATHOLOGY  CASE: [id number]  PATIENT: [name]  Surgical Pathology Report   Clinical History: ruptured spleen (cm)   FINAL MICROSCOPIC DIAGNOSIS:   A. SPLEEN, SPLENECTOMY:  - Spleen with disruption and associated hemorrhage   B. LIVER, BIOPSY:  - Mildly active steatohepatitis (grade 1 of 3). 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 There is moderate  micro- and macrovesicular steatosis (approximately 60%) with focal  ballooning degeneration. Portal tracts show minimal mixed inflammation  composed of mostly lymphocytes. Trichrome and reticulin stains show  portal fibrosis with multiple fibrous septa, some of which are bridging.  Iron stain shows no abnormal iron accumulation. PASD stain is negative  for any globular inclusions in hepatocytes. The findings are that of  mildly active steatohepatitis, alcoholic and/or non-alcoholic, with mild  to moderate fibrosis - please note that the degree of fibrosis may be  somewhat exaggerated due to subcapsular nature of the biopsy.  GROSS DESCRIPTION:   A: Received fresh is a 424 g, 16 x 12 x 5.5 cm spleen. There is a 15 x  12 cm area where the capsule is torn from the surface. There is clotted  blood adherent to the spleen in this area. The cut surfaces  show solid,  dark red parenchyma with areas of hemorrhage. Sections are submitted in  2 cassettes.   B: Received fresh is a 1.2 x 1.0 x 0.7 cm wedge biopsy of liver. The  cut surface is solid tan-brown. The specimen is sectioned and entirely  submitted in 1 cassette. (GRP 02/26/2020)   Final Diagnosis performed by Holley BoucheNilesh Kashikar, MD.  Electronically  signed 02/28/2020  Technical component performed at [facility], [address], [place], [place] [id number].  Professional component performed at [facility],  [address] [place], [place] [address].  Immunohistochemistry Technical component (if applicable) was performed  at [facility]. [address], [address],  [place], [place] [address].  IMMUNOHISTOCHEMISTRY DISCLAIMER (if applicable):  Some of these immunohistochemical stains may have been developed and the  performance characteristics determine by [facility]. Some  may not have been cleared or approved by the U.S. Food and Drug  Administration. The FDA has determined that such clearance or approval  is not necessary. This test is used for clinical purposes. It should not  be regarded as investigational or for research. This laboratory is  certified under the Clinical Laboratory Improvement Amendments of 1988  (CLIA-88) as qualified to perform high complexity clinical laboratory  testing. The controls stained appropriately.     Current Outpatient Medications  Medication Sig Dispense Refill  . acetaminophen (TYLENOL) 325 MG tablet Take 2 tablets (650 mg total) by mouth every 6 (six) hours as needed.    Marland Kitchen. apixaban (ELIQUIS) 5 MG TABS tablet Take 1 tablet (5 mg total) by mouth 2 (two) times daily. 60 tablet 0  . Cholecalciferol (VITAMIN D3 PO) Take 1 tablet by mouth daily.     . Cyanocobalamin (VITAMIN B-12 IJ) Inject as directed every 30 (thirty) days.    . folic acid (FOLVITE) 1 MG tablet TAKE 1  TABLET BY MOUTH EVERY DAY (Patient taking differently: Take 1 mg by mouth daily. ) 90 tablet 0  . methocarbamol (ROBAXIN) 500 MG tablet Take 2 tablets (1,000 mg total) by mouth every 6 (six) hours as needed for muscle spasms (pain not releived by tylenol or ibuprofen). 40 tablet 1  . MILK THISTLE PO Take 1 tablet by mouth daily.     . ondansetron (ZOFRAN-ODT) 4 MG disintegrating tablet Take 1 tablet (4 mg total) by mouth every 8 (eight) hours as needed for nausea or vomiting. 20 tablet 0  . oxycodone (OXY-IR) 5 MG capsule Take 5 mg by mouth every 6 (six) hours as needed.    . ursodiol (ACTIGALL) 500 MG tablet TAKE 1 TABLET BY MOUTH TWICE A DAY (Patient taking differently: Take 500 mg by mouth in the morning and at bedtime. ) 60 tablet 3  . albuterol (PROVENTIL HFA;VENTOLIN HFA) 108 (90 Base) MCG/ACT inhaler Inhale 2 puffs into the lungs every 6 (six) hours as needed for wheezing or shortness of breath. (Patient not taking: Reported on 03/06/2020) 1 Inhaler 0   No current facility-administered medications for this visit.    Allergies as of 03/12/2020 - Review Complete 03/12/2020  Allergen Reaction Noted  . Penicillins Anaphylaxis 10/18/2011  . Lidocaine Other (See Comments) 06/22/2014  ROS:  General: Negative for anorexia, weight loss, fever, chills, fatigue, + weakness. ENT: Negative for hoarseness, difficulty swallowing , nasal congestion. CV: Negative for chest pain, angina, palpitations, dyspnea on exertion, peripheral edema.  Respiratory: Negative for dyspnea at rest, dyspnea on exertion, cough, sputum, wheezing.  GI: See history of present illness. GU:  Negative for dysuria, hematuria, urinary incontinence, urinary frequency, nocturnal urination.  Endo: Negative for unusual weight change.    Physical Examination:   BP 119/80   Pulse (!) 111   Temp (!) 97 F (36.1 C)   Ht 5' 3.5" (1.613 m)   Wt 157 lb 3.2 oz (71.3 kg)   LMP 02/23/2020 (Exact Date)   BMI 27.41 kg/m    General: Well-nourished, well-developed in no acute distress.  Eyes: No icterus. Mouth: masked Lungs: Clear to auscultation bilaterally.  Heart: Regular rate and rhythm, no murmurs rubs or gallops.  Abdomen: Bowel sounds are normal,  nondistended, no hepatosplenomegaly or masses, no abdominal bruits or hernia , no rebound or guarding.  Site of previous JP drain covered with bandage. Midline incision without redness or drainage. Mild epig tenderness. abd soft.  Extremities: No lower extremity edema. No clubbing or deformities. Neuro: Alert and oriented x 4   Skin: Warm and dry, no jaundice.   Psych: Alert and cooperative, normal mood and affect.  Labs:  Lab Results  Component Value Date   WBC 9.3 03/02/2020   HGB 8.6 (L) 03/02/2020   HCT 27.3 (L) 03/02/2020   MCV 103.4 (H) 03/02/2020   PLT 824 (H) 03/02/2020   Lab Results  Component Value Date   CREATININE 0.68 02/27/2020   BUN <5 (L) 02/27/2020   NA 136 02/27/2020   K 3.8 02/27/2020   CL 107 02/27/2020   CO2 21 (L) 02/27/2020   Lab Results  Component Value Date   ALT 24 02/27/2020   AST 27 02/27/2020   ALKPHOS 66 02/27/2020   BILITOT 1.1 02/27/2020   Lab Results  Component Value Date   INR 1.2 02/24/2020   INR 1.2 02/23/2020   INR 1.1 02/23/2020    Lab Results  Component Value Date   FOLATE 3.0 (L) 05/12/2019   Lab Results  Component Value Date   VITAMINB12 190 07/24/2019      Imaging Studies: DG Forearm Left  Result Date: 02/23/2020 CLINICAL DATA:  Fall yesterday due to syncope EXAM: LEFT FOREARM - 2 VIEW COMPARISON:  None. FINDINGS: Distal radius fracture with ventral sided impaction and fragmentation. Intact and located proximal forearm. Negative for elbow joint effusion. Located radiocarpal joint. IMPRESSION: Impacted distal radius fracture. Electronically Signed   By: Marnee Spring M.D.   On: 02/23/2020 09:24   CT Angio Chest PE W and/or Wo Contrast  Result Date: 02/23/2020 CLINICAL DATA:  Larey Seat  last evening. Left-sided chest pain. Syncopal episode. EXAM: CT ANGIOGRAPHY CHEST CT ABDOMEN AND PELVIS WITH CONTRAST TECHNIQUE: Multidetector CT imaging of the chest was performed using the standard protocol during bolus administration of intravenous contrast. Multiplanar CT image reconstructions and MIPs were obtained to evaluate the vascular anatomy. Multidetector CT imaging of the abdomen and pelvis was performed using the standard protocol during bolus administration of intravenous contrast. CONTRAST:  OMNIPAQUE IOHEXOL 350 MG/ML SOLN COMPARISON:  None. FINDINGS: CTA CHEST FINDINGS Cardiovascular: The heart is normal in size. No pericardial effusion. The aorta is normal in caliber. No dissection. No atherosclerotic calcifications. The branch vessels are patent. No coronary artery calcifications. The pulmonary arteries appear normal. No filling  defects to suggest pulmonary embolism. Mediastinum/Nodes: No mediastinal or hilar mass or adenopathy or hematoma. The esophagus is grossly normal. Lungs/Pleura: No acute pulmonary findings. No pulmonary contusion or pneumothorax. No pleural effusion. Musculoskeletal: Musculoskeletal no rib, sternal or thoracic vertebral body fractures are identified. There is a remote healed left lateral rib fracture noted. Review of the MIP images confirms the above findings. CT ABDOMEN and PELVIS FINDINGS Hepatobiliary: No acute hepatic injury is identified. No hepatic lesions. There is diffuse fatty infiltration of the liver noted. The portal and hepatic veins are patent. The gallbladder is unremarkable. No common bile duct dilatation. Pancreas: No mass, inflammation or ductal dilatation. No acute injury. Spleen: Significant splenic injury is demonstrated. Multiple large lacerations extending to the splenic hilum with areas of active bleeding/extravasation suspected. There is a large subcapsular hematoma along with moderate abdominal/pelvic hemoperitoneum. Findings consistent  with a grade 4 splenic injury. Adrenals/Urinary Tract: The adrenal glands and kidneys are unremarkable. No acute renal injury or perinephric fluid collection. Stomach/Bowel: The stomach, duodenum, small bowel and colon are unremarkable. No acute inflammatory changes, mass lesions or obstructive findings. Vascular/Lymphatic: The aorta and branch vessels are patent. The major venous structures are patent. No mesenteric or retroperitoneal mass or adenopathy. Reproductive: Reproductive the uterus and ovaries are unremarkable. Tubal ligation clips are noted bilaterally. Other: Moderate volume abdominal/pelvic hemoperitoneum Musculoskeletal: The bony pelvis is intact. No pelvic fractures. The pubic symphysis and SI joints are maintained. Both hips are normally located. The lumbar vertebral bodies are normally aligned. No fractures are identified. Review of the MIP images confirms the above findings. IMPRESSION: 1. Grade 4 splenic injury with multiple large lacerations extending to the splenic hilum, small vascular injuries with contrast extravasation and moderate volume abdominal/pelvic hemoperitoneum. 2. No CT findings for pulmonary embolism. 3. No acute pulmonary findings or pneumothorax. 4. No acute bony findings. 5. Diffuse fatty infiltration of the liver. These results were called by telephone at the time of interpretation on 02/23/2020 at 2:18 pm to provider Geoffery Lyons , who verbally acknowledged these results. Electronically Signed   By: Rudie Meyer M.D.   On: 02/23/2020 14:18   CT ABDOMEN PELVIS W CONTRAST  Result Date: 02/26/2020 CLINICAL DATA:  44 year old female with abdominal trauma. 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 EXAM: CT ABDOMEN AND PELVIS WITH CONTRAST TECHNIQUE: Multidetector CT imaging of the abdomen and pelvis was performed using the standard protocol following bolus administration of intravenous contrast. CONTRAST:  OMNIPAQUE IOHEXOL 300 MG/ML  SOLN COMPARISON:  CT abdomen  pelvis dated 02/23/2020. FINDINGS: Lower chest: There are trace bilateral pleural effusions with small bibasilar atelectasis. No intra-abdominal free air. Small perihepatic free fluid. Hepatobiliary: There is fatty infiltration of the liver. There is slight irregularity of the liver contour which may represent early changes of cirrhosis. Clinical correlation is recommended. No intrahepatic biliary ductal dilatation. Layering high attenuating content within the gallbladder may represent sludge, small stones, or vicarious excretion of intravenous contrast. No pericholecystic fluid or evidence of acute cholecystitis by CT. Pancreas: There is edema and fluid surrounding the pancreas which may be related to postsurgical changes. Correlation with pancreatic enzymes recommended to exclude acute pancreatitis. Spleen: Status post splenectomy. There is a small amount of fluid in the surgical bed, likely post operative seroma. There is a drainage catheter with tip in the left upper abdomen in the region of the fluid. Adrenals/Urinary Tract: The adrenal glands unremarkable. There is no hydronephrosis on either side. There is symmetric enhancement and excretion of contrast by both  kidneys. The visualized ureters and urinary bladder appear unremarkable. Stomach/Bowel: There is no bowel obstruction or active inflammation. The appendix is normal. Vascular/Lymphatic: The abdominal aorta and IVC unremarkable. There is thrombus within the main portal vein and central intrahepatic portal venous branches. No portal venous gas. Postsurgical changes of proximal splenic vein. There is no adenopathy. Reproductive: The uterus is anteverted and grossly unremarkable. Bilateral tubal ligation clips. There is a 3 cm right ovarian cyst. Other: Midline vertical anterior abdominal wall surgical incision and cutaneous clips. No fluid collection. Musculoskeletal: No acute or significant osseous findings. IMPRESSION: 1. Thrombus within the main portal  vein and central intrahepatic portal venous branches. 2. Status post splenectomy. Small amount of fluid in the surgical bed, likely post operative seroma. 3. Edema and fluid surrounding the pancreas may be related to postsurgical changes. Correlation with pancreatic enzymes recommended to exclude acute pancreatitis. 4. Fatty liver with possible early changes of cirrhosis. Clinical correlation is recommended. 5. A 3 cm right ovarian cyst. 6. Trace bilateral pleural effusions with small bibasilar atelectasis. These results were called by telephone at the time of interpretation on 02/26/2020 at 5:46 pm to Dr. Sheliah Hatch, who verbally acknowledged these results. Electronically Signed   By: Elgie Collard M.D.   On: 02/26/2020 17:59   CT ABDOMEN PELVIS W CONTRAST  Result Date: 02/23/2020 CLINICAL DATA:  Larey Seat last evening. Left-sided chest pain. Syncopal episode. EXAM: CT ANGIOGRAPHY CHEST CT ABDOMEN AND PELVIS WITH CONTRAST TECHNIQUE: Multidetector CT imaging of the chest was performed using the standard protocol during bolus administration of intravenous contrast. Multiplanar CT image reconstructions and MIPs were obtained to evaluate the vascular anatomy. Multidetector CT imaging of the abdomen and pelvis was performed using the standard protocol during bolus administration of intravenous contrast. CONTRAST:  OMNIPAQUE IOHEXOL 350 MG/ML SOLN COMPARISON:  None. FINDINGS: CTA CHEST FINDINGS Cardiovascular: The heart is normal in size. No pericardial effusion. The aorta is normal in caliber. No dissection. No atherosclerotic calcifications. The branch vessels are patent. No coronary artery calcifications. The pulmonary arteries appear normal. No filling defects to suggest pulmonary embolism. Mediastinum/Nodes: No mediastinal or hilar mass or adenopathy or hematoma. The esophagus is grossly normal. Lungs/Pleura: No acute pulmonary findings. No pulmonary contusion or pneumothorax. No pleural effusion.  Musculoskeletal: Musculoskeletal no rib, sternal or thoracic vertebral body fractures are identified. There is a remote healed left lateral rib fracture noted. Review of the MIP images confirms the above findings. CT ABDOMEN and PELVIS FINDINGS Hepatobiliary: No acute hepatic injury is identified. No hepatic lesions. There is diffuse fatty infiltration of the liver noted. The portal and hepatic veins are patent. The gallbladder is unremarkable. No common bile duct dilatation. Pancreas: No mass, inflammation or ductal dilatation. No acute injury. Spleen: Significant splenic injury is demonstrated. Multiple large lacerations extending to the splenic hilum with areas of active bleeding/extravasation suspected. There is a large subcapsular hematoma along with moderate abdominal/pelvic hemoperitoneum. Findings consistent with a grade 4 splenic injury. Adrenals/Urinary Tract: The adrenal glands and kidneys are unremarkable. No acute renal injury or perinephric fluid collection. Stomach/Bowel: The stomach, duodenum, small bowel and colon are unremarkable. No acute inflammatory changes, mass lesions or obstructive findings. Vascular/Lymphatic: The aorta and branch vessels are patent. The major venous structures are patent. No mesenteric or retroperitoneal mass or adenopathy. Reproductive: Reproductive the uterus and ovaries are unremarkable. Tubal ligation clips are noted bilaterally. Other: Moderate volume abdominal/pelvic hemoperitoneum Musculoskeletal: The bony pelvis is intact. No pelvic fractures. The pubic symphysis and SI joints are  maintained. Both hips are normally located. The lumbar vertebral bodies are normally aligned. No fractures are identified. Review of the MIP images confirms the above findings. IMPRESSION: 1. Grade 4 splenic injury with multiple large lacerations extending to the splenic hilum, small vascular injuries with contrast extravasation and moderate volume abdominal/pelvic hemoperitoneum. 2. No  CT findings for pulmonary embolism. 3. No acute pulmonary findings or pneumothorax. 4. No acute bony findings. 5. Diffuse fatty infiltration of the liver. These results were called by telephone at the time of interpretation on 02/23/2020 at 2:18 pm to provider Geoffery Lyons , who verbally acknowledged these results. Electronically Signed   By: Rudie Meyer M.D.   On: 02/23/2020 14:18   DG CHEST PORT 1 VIEW  Result Date: 03/01/2020 CLINICAL DATA:  Shortness of breath. EXAM: PORTABLE CHEST 1 VIEW COMPARISON:  February 19, 2016. FINDINGS: The heart size and mediastinal contours are within normal limits. Both lungs are clear. No visible pleural effusions or pneumothorax. The visualized skeletal structures are unremarkable. IMPRESSION: No active disease. Electronically Signed   By: Feliberto Harts MD   On: 03/01/2020 09:10   DG Abd Portable 1V  Result Date: 02/26/2020 CLINICAL DATA:  Postoperative ileus.  Abdominal distension. EXAM: PORTABLE ABDOMEN - 1 VIEW COMPARISON:  January 22, 2015. FINDINGS: Surgical drain is seen in the left side of the abdomen with the distal tip in the expected position of the left upper quadrant. Sternotomy wires are noted. No colonic dilatation is noted. Mildly dilated small bowel loops are noted which most likely represents postoperative ileus. IMPRESSION: Mildly dilated small bowel loops are noted which most likely represents postoperative ileus. Follow-up radiographs are recommended. Electronically Signed   By: Lupita Raider M.D.   On: 02/26/2020 09:12   XR Foot 2 Views Left  Result Date: 02/19/2020 PIP and DIP narrowing was noted.  No MTP, intertarsal or tibiotalar joint space narrowing was noted.  No erosive changes were noted. Impression: Mild osteoarthritic changes were noted.  XR Foot 2 Views Right  Result Date: 02/19/2020 PIP and DIP narrowing was noted.  No MTP, intertarsal or tibiotalar joint space narrowing was noted.  No erosive changes were noted.  Impression: Mild osteoarthritic changes were noted.  XR Hand 2 View Left  Result Date: 02/19/2020 Mild PIP narrowing was noted.  No MCP, intercarpal or radiocarpal joint space narrowing was noted.  No erosive changes were noted. Impression: These findings are consistent with early osteoarthritic changes.  XR Hand 2 View Right  Result Date: 02/19/2020 Mild PIP narrowing was noted.  No MCP, intercarpal or radiocarpal joint space narrowing was noted.  No erosive changes were noted. Impression: These findings are consistent with early osteoarthritic changes.  XR KNEE 3 VIEW LEFT  Result Date: 02/19/2020 No medial or lateral compartment narrowing was noted.  Mild patellofemoral narrowing was noted.  No chondrocalcinosis was noted. 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  XR KNEE 3 VIEW RIGHT  Result Date: 02/19/2020 No medial lateral compartment narrowing was noted.  Patellofemoral narrowing was noted.  No chondrocalcinosis was noted. Impression: Unremarkable x-ray of the knee joint.  XR Pelvis 1-2 Views  Result Date: 02/19/2020 No SI joint sclerosis or narrowing was noted.  Bilateral hip joint x-rays were unremarkable. Impression: Unremarkable x-ray of the SI joints and the hip joints.   Impression:  Pleasant 44 year old female with history of chronic liver disease, alcoholic hepatitis, elevated AMA empirically treated with Urso for 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  Recent admission for splenic laceration requiring splenectomy.  She underwent liver biopsy based on nodular appearing liver at time of surgery.  Developed portal venous thrombosis during admission.  Now on Eliquis.  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  Describes a couple of falls during the 1 week span prior to presentation, nothing significantly traumatic. History of splenomegaly prior to event.  At time of discharge she had received postsplenectomy vaccinations as  outlined above, plans for pneumococcal vaccine 8 weeks post splenectomy.  Followed by PCP.  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  Thrombus noted within the main portal vein and central intrahepatic portal venous branches.  Currently on Eliquis 5 mg twice daily.  We will have her follow with hematology.  Chronic liver disease: History of prior alcoholic hepatitis, elevated AMA empirically treated with Urso for presumed PBC.  Suspected significant fibrosis/cirrhosis given grossly nodular appearing liver at time of recent laparotomy.  Biopsy showed mildly active steatohepatitis, alcoholic and/or nonalcoholic, with mild to moderate fibrosis. Mildly active steatohepatitis (grade 1 of 3). 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 Continue Urso 500 mg twice daily. Update vitamin D.   B12/folate deficiency: We will update B12, folate.   Plan:  1. 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 Patient currently on Eliquis. 2. Update labs. 3. Continue urso  bid. 4. Trial of linzess daily for constipation.  5. If upper abdominal fullness does not improve with management of constipation and time for healing with surgery, she will let me know.  6. Review liver biopsy with Dr. Marletta Lor 7. Further recommendations to follow.

## 2020-03-12 NOTE — Progress Notes (Signed)
New Patient Visit  Assessment: Kara Stewart is a 44 y.o. female with the following: Left distal radius fracture, with increased volar tilt  Plan: I had an extensive discussion with Kara Stewart in clinic today in regards to her left wrist.  Her injury was sustained 3-4 weeks ago after a fall.  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  She is now on Eliquis.  In short, she continues to convalesce following surgery and is not interested in surgery on her left wrist at this time, nor is she a good candidate for operative fixation.  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  Any surgical procedure from this point forward, would likely require an osteotomy.  For reasons discussed above, I do not feel that this is appropriate at this time.  In addition, should she wish to undergo a procedure in the future, I think it would be more appropriate for her to be evaluated by a hand specialist.  She was seen by Dr. Merlyn Stewart during her hospital admission, but her other surgeries took priority, thus she did not have scheduled follow-up.  I did recommend continued immobilization, we discussed her continuing with the removable wrist splint versus cast immobilization.  I do think she would benefit from cast immobilization for a short period of time, as the splint is currently allowing too much motion, which results in aggravation of her pain.  After much discussion, she has elected to proceed with cast immobilization for the next couple of weeks.  Cast application - left short arm cast   Verbal consent was obtained and the correct extremity was identified. A well padded, appropriately molded short arm cast was applied to the left arm Fingers remained warm and well perfused.   There were no sharp edges Patient tolerated the procedure  well Cast care instructions were provided    Follow-up: Return in about 2 weeks (around 03/26/2020).   X-rays left wrist out of cast  Subjective:  Chief Complaint  Patient presents with  . Hand Pain    had a fall 02/21/20.     History of Present Illness: Kara Stewart is a 44 y.o.RHD female who has been referred by Kara Platt, MD for evaluation of left wrist pain.  Briefly, she sustained a fall 3-4 weeks ago.  At that time she ruptured her spleen, and subsequently underwent a splenectomy.  She was evaluated by Dr. Merlyn Stewart for her left wrist while admitted, and noted to have a distal radius fracture.  She was placed in a removable wrist splint, and told to follow-up in clinic, if she is interested in pursuing surgery.  Her postoperative course was complicated by blood clots, and she is now taking Eliquis.  She continues to have significant pain and discomfort in the left wrist.  She uses the splint most of the time, and notes sharp pains with certain motions.  Occasionally, she would bump her wrist, or worsen her pain by reaching behind her back for example.  She continues to take narcotic pain medications, but has recently run out.  She is only taking Flexeril as part of her recovery from her abdominal surgery.  She also has some numbness to her left small finger, with occasional tingling sensations.  She did not have any of these complaints prior to surgery.  No injury to  her elbow.   Review of Systems: No fevers or chills No chest pain No shortness of breath No bowel or bladder dysfunction Constipation No headaches   Medical History:  Past Medical History:  Diagnosis Date  . Acid reflux   . 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 Alcoholic hepatitis without ascites   . Anxiety   . Asthma   . Colitis   . GERD (gastroesophageal reflux disease) 07/21/2012  . Hiatal hernia   . History of alcohol abuse 04/26/2015  . Hypertriglyceridemia   . Hypothyroid   . Iritis    frequent  .  Ovarian cyst   . Panic attacks   . SVT (supraventricular tachycardia) (HCC)    last issue 12/2017    Past Surgical History:  Procedure Laterality Date  . BIOPSY N/A 02/05/2015   Procedure: BIOPSY;  Surgeon: West Bali, MD;  Location: [facility];  Service: Endoscopy;  Laterality: N/A;  . COLONOSCOPY    . COLONOSCOPY WITH PROPOFOL N/A 02/05/2015   PPI:RJJOA HH/mild diverticulosis  . ESOPHAGEAL DILATION N/A 11/21/2015   Procedure: ESOPHAGEAL DILATION;  Surgeon: Corbin Ade, MD;  Location: [facility];  Service: Endoscopy;  Laterality: N/A;  . ESOPHAGOGASTRODUODENOSCOPY  11/06/2011   [name]: MILD Esophagitis/PATENT ESOPHAGEAL Stricture/  Moderate gastritis. Bx no.hpylori or celiac, +gastritis  . ESOPHAGOGASTRODUODENOSCOPY (EGD) WITH PROPOFOL N/A 03/20/2014   [name]: 1. Mild esophagitis & distal esophagela stricture. 2. small hiatal hernia 3. moderate non-erosive gastritis and mild duodenits  . ESOPHAGOGASTRODUODENOSCOPY (EGD) WITH PROPOFOL N/A 11/21/2015   Dr. Jena Gauss: LA grade B esophagitis, MW tear likely source of hematemesis  . LAPAROSCOPIC TUBAL LIGATION Bilateral 09/14/2018   Procedure: LAPAROSCOPIC TUBAL LIGATION;  Surgeon: Allie Bossier, MD;  Location: [facility];  Service: Gynecology;  Laterality: Bilateral;  . LIVER BIOPSY  02/23/2020   Procedure: OPEN LIVER BIOPSY;  Surgeon: Fritzi Mandes, MD;  Location: [facility];  Service: General;;  . Gaspar Bidding DILATION N/A 03/20/2014   Procedure: SAVORY DILATION;  Surgeon: West Bali, MD;  Location: [facility];  Service: Endoscopy;  Laterality: N/A;  dilated with # 12.8, 14,15,16  . SPLENECTOMY, TOTAL N/A 02/23/2020   Procedure: SPLENECTOMY;  Surgeon: Fritzi Mandes, MD;  Location: [facility] OR;  Service: General;  Laterality: N/A;  . TOOTH EXTRACTION      Family History  Problem Relation Age of Onset  . Stomach cancer Paternal Grandfather        colon cancer  . Cancer Paternal Grandfather        throat and esophagus  . Breast cancer Maternal  Grandmother   . Cancer Maternal Grandmother        skin  . Anxiety disorder Maternal Grandmother   . Hypertension Mother   . Other Mother        fatty liver  . Hyperlipidemia Mother   . Liver disease Mother        fatty liver, does not drink.   . Sleep apnea Mother   . Other Father        varicose veins; stomach issues; hernia  . Hypertension Father   . Hyperlipidemia Father   . Cancer Father        prostate  . Arthritis Father        rheumatoid  . Neuropathy Father   . Rheum arthritis Father   . Thyroid disease Sister   . Hypertension Sister   . Other Brother        hernia  . Diabetes Maternal Grandfather   .  Heart disease Maternal Grandfather   . Other Paternal Grandmother        hernia  . COPD Paternal Grandmother   . Diabetes Paternal Grandmother   . Healthy Daughter    Social History   Tobacco Use  . Smoking status: Current Some Day Smoker    Types: Cigarettes    Last attempt to quit: 12/22/2017    Years since quitting: 2.2  . Smokeless tobacco: Never Used  . Tobacco comment: "might smoke one on a bad day"  Vaping Use  . Vaping Use: Never used  Substance Use Topics  . Alcohol use: No    Alcohol/week: 0.0 standard drinks    Comment: Sober since 2017  . Drug use: Never    Allergies  Allergen Reactions  . Penicillins Anaphylaxis    Has patient had a PCN reaction causing immediate rash, facial/tongue/throat swelling, SOB or lightheadedness with hypotension: yes Has patient had a PCN reaction causing severe rash involving mucus membranes or skin necrosis: No Has patient had a PCN reaction that required hospitalization Yes Has patient had a PCN reaction occurring within the last 10 years: No If all of the above answers are "NO", then may proceed with Cephalosporin use.   . Lidocaine Other (See Comments)    Can use topical lidocaine but if ingested causes Hallucinations.    Current Meds  Medication Sig  . acetaminophen (TYLENOL) 325 MG tablet Take 2  tablets (650 mg total) by mouth every 6 (six) hours as needed.  Marland Kitchen apixaban (ELIQUIS) 5 MG TABS tablet Take 1 tablet (5 mg total) by mouth 2 (two) times daily.  . Cholecalciferol (VITAMIN D3 PO) Take 1 tablet by mouth daily.   . Cyanocobalamin (VITAMIN B-12 IJ) Inject as directed every 30 (thirty) days.  . folic acid (FOLVITE) 1 MG tablet TAKE 1 TABLET BY MOUTH EVERY DAY (Patient taking differently: Take 1 mg by mouth daily. )  . methocarbamol (ROBAXIN) 500 MG tablet Take 2 tablets (1,000 mg total) by mouth every 6 (six) hours as needed for muscle spasms (pain not releived by tylenol or ibuprofen).  Marland Kitchen MILK THISTLE PO Take 1 tablet by mouth daily.   . ondansetron (ZOFRAN-ODT) 4 MG disintegrating tablet Take 1 tablet (4 mg total) by mouth every 8 (eight) hours as needed for nausea or vomiting.  Marland Kitchen oxycodone (OXY-IR) 5 MG capsule Take 5 mg by mouth every 6 (six) hours as needed.  . ursodiol (ACTIGALL) 500 MG tablet TAKE 1 TABLET BY MOUTH TWICE A DAY (Patient taking differently: Take 500 mg by mouth in the morning and at bedtime. )    Objective: BP 121/74   Pulse 89   Ht 5\' 3"  (1.6 m)   Wt 156 lb (70.8 kg)   LMP 02/23/2020 (Exact Date)   BMI 27.63 kg/m   Physical Exam:  General: Alert and oriented, no acute distress Gait: Normal  Evaluation of the left wrist and forearm demonstrates persistent ecchymosis in the volar aspect of her forearm, into her palm.  Active motion intact in the AIN/PIN/U nerve distributions.  She has numbness to the small finger.  Mild Tinel's at the cubital tunnel.  Very little range of motion as tolerated at the left wrist.  Even small motion causes significant discomfort.  Overall atrophy of the left forearm.    IMAGING: I personally ordered and reviewed the following images  X-ray of the left wrist were obtained in clinic today and demonstrates a distal radius fracture, with a primary volar component.  Increased volar tilt.  Loss of radial tilt and inclination due  to the impaction of the fracture.  Impression: Impacted left distal radius fracture, with increased volar tilt as a result of a displaced volar fracture fragment.  New Medications:  Meds ordered this encounter  Medications  . oxyCODONE (ROXICODONE) 5 MG immediate release tablet    Sig: Take 1 tablet (5 mg total) by mouth every 4 (four) hours as needed for up to 5 days.    Dispense:  15 tablet    Refill:  0      Oliver BarreMark A Charmika Macdonnell, MD  03/12/2020 11:03 PM

## 2020-03-12 NOTE — Assessment & Plan Note (Signed)
history of prior alcohol abuse w/ alcohol related hepatitis, fell and developed lacerationand leftimpacted distal radius fracture. Tachycardia. 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  02/23/20: Splenectomy (with hemorrhage), liver biopsy : Steatohepatitis Treatment Plan: Lifelong Anti coagulation with Eliquis

## 2020-03-12 NOTE — Patient Instructions (Addendum)
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  Labs as discussed.   Continue Urso 500mg  twice daily.  Trial of Linzess once daily for constipation. If the fullness in the upper abdomen does not improve, please let me know.  We will be in touch with further information as discussed today once I have reviewed CTs, liver biopsy, and checked about the flu vaccine.

## 2020-03-13 ENCOUNTER — Other Ambulatory Visit: Payer: Self-pay | Admitting: Gastroenterology

## 2020-03-13 ENCOUNTER — Other Ambulatory Visit (HOSPITAL_COMMUNITY)
Admission: RE | Admit: 2020-03-13 | Discharge: 2020-03-13 | Disposition: A | Discharge status: Home / Self Care | Payer: Medicaid Other | Source: Ambulatory Visit | Attending: Gastroenterology | Admitting: Gastroenterology

## 2020-03-13 ENCOUNTER — Encounter (HOSPITAL_COMMUNITY): Payer: Self-pay | Admitting: Hematology and Oncology

## 2020-03-13 ENCOUNTER — Telehealth: Payer: Self-pay | Admitting: Hematology and Oncology

## 2020-03-13 ENCOUNTER — Other Ambulatory Visit: Payer: Self-pay

## 2020-03-13 ENCOUNTER — Inpatient Hospital Stay (HOSPITAL_COMMUNITY): Payer: Medicaid Other | Attending: Hematology and Oncology | Admitting: Hematology and Oncology

## 2020-03-13 ENCOUNTER — Inpatient Hospital Stay (HOSPITAL_COMMUNITY): Payer: Medicaid Other

## 2020-03-13 VITALS — BP 106/66 | HR 95 | Temp 97.1°F | Resp 18 | Ht 63.5 in | Wt 156.6 lb

## 2020-03-13 DIAGNOSIS — Z801 Family history of malignant neoplasm of trachea, bronchus and lung: Secondary | ICD-10-CM | POA: Insufficient documentation

## 2020-03-13 DIAGNOSIS — G4700 Insomnia, unspecified: Secondary | ICD-10-CM | POA: Insufficient documentation

## 2020-03-13 DIAGNOSIS — Z79899 Other long term (current) drug therapy: Secondary | ICD-10-CM | POA: Insufficient documentation

## 2020-03-13 DIAGNOSIS — D539 Nutritional anemia, unspecified: Secondary | ICD-10-CM | POA: Diagnosis not present

## 2020-03-13 DIAGNOSIS — Z833 Family history of diabetes mellitus: Secondary | ICD-10-CM | POA: Insufficient documentation

## 2020-03-13 DIAGNOSIS — F17210 Nicotine dependence, cigarettes, uncomplicated: Secondary | ICD-10-CM | POA: Insufficient documentation

## 2020-03-13 DIAGNOSIS — I81 Portal vein thrombosis: Secondary | ICD-10-CM | POA: Diagnosis not present

## 2020-03-13 DIAGNOSIS — D509 Iron deficiency anemia, unspecified: Secondary | ICD-10-CM | POA: Diagnosis present

## 2020-03-13 DIAGNOSIS — E039 Hypothyroidism, unspecified: Secondary | ICD-10-CM | POA: Diagnosis not present

## 2020-03-13 DIAGNOSIS — E781 Pure hyperglyceridemia: Secondary | ICD-10-CM | POA: Diagnosis not present

## 2020-03-13 DIAGNOSIS — Z7901 Long term (current) use of anticoagulants: Secondary | ICD-10-CM | POA: Diagnosis not present

## 2020-03-13 DIAGNOSIS — K743 Primary biliary cirrhosis: Secondary | ICD-10-CM

## 2020-03-13 DIAGNOSIS — Z800 Family history of malignant neoplasm of digestive organs: Secondary | ICD-10-CM | POA: Diagnosis not present

## 2020-03-13 DIAGNOSIS — Z8249 Family history of ischemic heart disease and other diseases of the circulatory system: Secondary | ICD-10-CM | POA: Insufficient documentation

## 2020-03-13 DIAGNOSIS — E538 Deficiency of other specified B group vitamins: Secondary | ICD-10-CM | POA: Insufficient documentation

## 2020-03-13 DIAGNOSIS — Z803 Family history of malignant neoplasm of breast: Secondary | ICD-10-CM | POA: Diagnosis not present

## 2020-03-13 DIAGNOSIS — J45909 Unspecified asthma, uncomplicated: Secondary | ICD-10-CM | POA: Insufficient documentation

## 2020-03-13 DIAGNOSIS — K5900 Constipation, unspecified: Secondary | ICD-10-CM | POA: Insufficient documentation

## 2020-03-13 LAB — IRON AND TIBC
Iron: 20 ug/dL — ABNORMAL LOW (ref 28–170)
Saturation Ratios: 5 % — ABNORMAL LOW (ref 10.4–31.8)
TIBC: 372 ug/dL (ref 250–450)
UIBC: 352 ug/dL

## 2020-03-13 LAB — CBC WITH DIFFERENTIAL/PLATELET
Abs Immature Granulocytes: 0.03 10*3/uL (ref 0.00–0.07)
Basophils Absolute: 0.1 10*3/uL (ref 0.0–0.1)
Basophils Relative: 2 %
Eosinophils Absolute: 0.3 10*3/uL (ref 0.0–0.5)
Eosinophils Relative: 4 %
HCT: 35.7 % — ABNORMAL LOW (ref 36.0–46.0)
Hemoglobin: 11.1 g/dL — ABNORMAL LOW (ref 12.0–15.0)
Immature Granulocytes: 0 %
Lymphocytes Relative: 35 %
Lymphs Abs: 2.4 10*3/uL (ref 0.7–4.0)
MCH: 31.9 pg (ref 26.0–34.0)
MCHC: 31.1 g/dL (ref 30.0–36.0)
MCV: 102.6 fL — ABNORMAL HIGH (ref 80.0–100.0)
Monocytes Absolute: 0.8 10*3/uL (ref 0.1–1.0)
Monocytes Relative: 12 %
Neutro Abs: 3.2 10*3/uL (ref 1.7–7.7)
Neutrophils Relative %: 47 %
Platelets: 1089 10*3/uL (ref 150–400)
RBC: 3.48 MIL/uL — ABNORMAL LOW (ref 3.87–5.11)
RDW: 16.8 % — ABNORMAL HIGH (ref 11.5–15.5)
WBC: 6.8 10*3/uL (ref 4.0–10.5)
nRBC: 0 % (ref 0.0–0.2)

## 2020-03-13 LAB — HEPATIC FUNCTION PANEL
ALT: 17 U/L (ref 0–44)
AST: 28 U/L (ref 15–41)
Albumin: 3.1 g/dL — ABNORMAL LOW (ref 3.5–5.0)
Alkaline Phosphatase: 94 U/L (ref 38–126)
Bilirubin, Direct: 0.1 mg/dL (ref 0.0–0.2)
Indirect Bilirubin: 0.2 mg/dL — ABNORMAL LOW (ref 0.3–0.9)
Total Bilirubin: 0.3 mg/dL (ref 0.3–1.2)
Total Protein: 6.3 g/dL — ABNORMAL LOW (ref 6.5–8.1)

## 2020-03-13 LAB — VITAMIN D 25 HYDROXY (VIT D DEFICIENCY, FRACTURES): Vit D, 25-Hydroxy: 44.54 ng/mL (ref 30–100)

## 2020-03-13 LAB — VITAMIN B12
Vitamin B-12: 700 pg/mL (ref 180–914)
Vitamin B-12: 737 pg/mL (ref 180–914)

## 2020-03-13 LAB — FERRITIN: Ferritin: 34 ng/mL (ref 11–307)

## 2020-03-13 LAB — FOLATE: Folate: 17.1 ng/mL (ref >5.9)

## 2020-03-13 MED ORDER — URSODIOL 500 MG PO TABS: 500.0000 mg | ORAL_TABLET | Freq: Two times a day (BID) | ORAL | 3 refills | Status: DC

## 2020-03-13 NOTE — Telephone Encounter (Signed)
Urso 500 mg BID sent to pharmacy

## 2020-03-13 NOTE — Telephone Encounter (Signed)
I called the patient to provide the results of the CBC. Unfortunately it said that the mailbox is full and cannot accept any messages. The platelet count of 1089 is because of recent splenectomy.  This number will settle down and come back down without any treatment. Her hemoglobin has improved to 11.1. She will be evaluated again after the hypercoagulability work-up is complete.

## 2020-03-13 NOTE — Telephone Encounter (Signed)
Phoned the pt back and no answer and voicemail was full.

## 2020-03-13 NOTE — Patient Instructions (Signed)
[facility] at [facility] Discharge Instructions  You were seen today by Dr. Gudena.    Thank you for choosing [facility] at [facility] to provide your oncology and hematology care.  To afford each patient quality time with our provider, please arrive at least 15 minutes before your scheduled appointment time.   If you have a lab appointment with the Cancer Center please come in thru the Main Entrance and check in at the main information desk.  You need to re-schedule your appointment should you arrive 10 or more minutes late.  We strive to give you quality time with our providers, and arriving late affects you and other patients whose appointments are after yours.  Also, if you no show three or more times for appointments you may be dismissed from the clinic at the providers discretion.     Again, thank you for choosing [facility].  Our hope is that these requests will decrease the amount of time that you wait before being seen by our physicians.       _____________________________________________________________  Should you have questions after your visit to [facility], please contact our office at [phone] and follow the prompts.  Our office hours are 8:00 a.m. and 4:30 p.m. Monday - Friday.  Please note that voicemails left after 4:00 p.m. may not be returned until the following business day.  We are closed weekends and major holidays.  You do have access to a nurse 24-7, just call the main number to the clinic [phone] and do not press any options, hold on the line and a nurse will answer the phone.    For prescription refill requests, have your pharmacy contact our office and allow 72 hours.    Due to Covid, you will need to wear a mask upon entering the hospital. If you do not have a mask, a mask will be given to you at the Main Entrance upon arrival. For doctor visits, patients may have 1 support person age  18 or older with them. For treatment visits, patients can not have anyone with them due to social distancing guidelines and our immunocompromised population.      

## 2020-03-13 NOTE — Telephone Encounter (Signed)
Spoke with pt. Samples of Linsess 145 mcg are ready for pickup. Pt is to try trial of medication prior to getting Linzess sent into her pharmacy.   Pt also would like a refill of Urso 500 mg medication sent into her pharmacy.

## 2020-03-13 NOTE — Progress Notes (Signed)
[facility] CONSULT NOTE  Patient Care Team: Anabel Halon, MD as PCP - General (Internal Medicine) West Bali, MD (Inactive) as Consulting Physician (Gastroenterology) Lanelle Bal, DO as Consulting Physician (Internal Medicine)  CHIEF COMPLAINTS/PURPOSE OF CONSULTATION:  Portal vein thrombosis  HISTORY OF PRESENTING ILLNESS:  Kara Stewart 44 y.o. female is here because of recent diagnosis of portal vein thrombosis. She has a prior history of alcohol abuse and alcohol-related hepatitis who presented to the emergency room after multiple falls and laceration which led to distal radius fracture on the left as well has clinic ruptured. She was transferred to [facility] where she underwent splenectomy on 02/23/2020. Postoperatively developed ileus which resolved. Received the postsplenectomy vaccinations in the hospital. CT scans done in the hospital revealed portal vein thrombosis. She was started on anticoagulation upon discharge. Regarding the radial fracture patient had seen Dr. Merlyn Lot who is managing it. She follows with [facility] gastroenterology for GERD home ursodiol. She was referred to [place] for discussion regarding anticoagulation for portal vein thrombosis.  I reviewed her records extensively and collaborated the history with the patient.    MEDICAL HISTORY:  Past Medical History:  Diagnosis Date  . Acid reflux   . 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 Alcoholic hepatitis without ascites   . Anxiety   . Asthma   . Colitis   . GERD (gastroesophageal reflux disease) 07/21/2012  . Hiatal hernia   . History of alcohol abuse 04/26/2015  . Hypertriglyceridemia   . Hypothyroid   . Iritis    frequent  . Ovarian cyst   . Panic attacks   . SVT (supraventricular tachycardia) (HCC)    last issue 12/2017    SURGICAL HISTORY: Past Surgical History:  Procedure Laterality Date  . BIOPSY N/A 02/05/2015   Procedure: BIOPSY;  Surgeon: West Bali, MD;   Location: [facility] ORS;  Service: Endoscopy;  Laterality: N/A;  . COLONOSCOPY    . COLONOSCOPY WITH PROPOFOL N/A 02/05/2015   WUJ:[id number] HH/mild diverticulosis  . ESOPHAGEAL DILATION N/A 11/21/2015   Procedure: ESOPHAGEAL DILATION;  Surgeon: Corbin Ade, MD;  Location: [facility] ENDO SUITE;  Service: Endoscopy;  Laterality: N/A;  . ESOPHAGOGASTRODUODENOSCOPY  11/06/2011   [name]: MILD Esophagitis/PATENT ESOPHAGEAL Stricture/  Moderate gastritis. Bx no.hpylori or celiac, +gastritis  . ESOPHAGOGASTRODUODENOSCOPY (EGD) WITH PROPOFOL N/A 03/20/2014   [name]: 1. Mild esophagitis & distal esophagela stricture. 2. small hiatal hernia 3. moderate non-erosive gastritis and mild duodenits  . ESOPHAGOGASTRODUODENOSCOPY (EGD) WITH PROPOFOL N/A 11/21/2015   Dr. Jena Gauss: LA grade B esophagitis, MW tear likely source of hematemesis  . LAPAROSCOPIC TUBAL LIGATION Bilateral 09/14/2018   Procedure: LAPAROSCOPIC TUBAL LIGATION;  Surgeon: Allie Bossier, MD;  Location: [facility];  Service: Gynecology;  Laterality: Bilateral;  . LIVER BIOPSY  02/23/2020   Procedure: OPEN LIVER BIOPSY;  Surgeon: Fritzi Mandes, MD;  Location: [facility] OR;  Service: General;;  . Gaspar Bidding DILATION N/A 03/20/2014   Procedure: SAVORY DILATION;  Surgeon: West Bali, MD;  Location: [facility] ORS;  Service: Endoscopy;  Laterality: N/A;  dilated with # 12.8, 14,15,16  . SPLENECTOMY, TOTAL N/A 02/23/2020   Procedure: SPLENECTOMY;  Surgeon: Fritzi Mandes, MD;  Location: [facility] OR;  Service: General;  Laterality: N/A;  . TOOTH EXTRACTION      SOCIAL HISTORY: Social History   Socioeconomic History  . Marital status: Divorced    Spouse name: Not on file  . Number of children: 0  . Years of  education: Not on file  . Highest education level: Not on file  Occupational History  . Occupation: was at [employer] but not working now  Tobacco Use  . Smoking status: Current Some Day Smoker    Types: Cigarettes    Last attempt to quit: 12/22/2017    Years since quitting:  2.2  . Smokeless tobacco: Never Used  . Tobacco comment: "might smoke one on a bad day"  Vaping Use  . Vaping Use: Never used  Substance and Sexual Activity  . Alcohol use: No    Alcohol/week: 0.0 standard drinks    Comment: Sober since 2017  . Drug use: Never  . Sexual activity: Yes    Birth control/protection: Condom  Other Topics Concern  . Not on file  Social History Narrative   Lives alone with a roommate. Dating and in safe relationship. Does not smoke. Denies drug use.    Previous MD: Phill MutterAnn Lewis, NP ([place], [place])      Social Determinants of Health   Financial Resource Strain:   . Difficulty of Paying Living Expenses: Not on file  Food Insecurity:   . Worried About Programme researcher, broadcasting/film/videounning Out of Food in the Last Year: Not on file  . Ran Out of Food in the Last Year: Not on file  Transportation Needs:   . Lack of Transportation (Medical): Not on file  . Lack of Transportation (Non-Medical): Not on file  Physical Activity:   . Days of Exercise per Week: Not on file  . Minutes of Exercise per Session: Not on file  Stress:   . Feeling of Stress : Not on file  Social Connections:   . Frequency of Communication with Friends and Family: Not on file  . Frequency of Social Gatherings with Friends and Family: Not on file  . Attends Religious Services: Not on file  . Active Member of Clubs or Organizations: Not on file  . Attends BankerClub or Organization Meetings: Not on file  . Marital Status: Not on file  Intimate Partner Violence:   . Fear of Current or Ex-Partner: Not on file  . Emotionally Abused: Not on file  . Physically Abused: Not on file  . Sexually Abused: Not on file    FAMILY HISTORY: Family History  Problem Relation Age of Onset  . Stomach cancer Paternal Grandfather        colon cancer  . Cancer Paternal Grandfather        throat and esophagus  . Breast cancer Maternal Grandmother   . Cancer Maternal Grandmother        skin  . Anxiety disorder Maternal Grandmother   .  Hypertension Mother   . Other Mother        fatty liver  . Hyperlipidemia Mother   . Liver disease Mother        fatty liver, does not drink.   . Sleep apnea Mother   . Other Father        varicose veins; stomach issues; hernia  . Hypertension Father   . Hyperlipidemia Father   . Cancer Father        prostate  . Arthritis Father        rheumatoid  . Neuropathy Father   . Rheum arthritis Father   . Thyroid disease Sister   . Hypertension Sister   . Other Brother        hernia  . Diabetes Maternal Grandfather   . Heart disease Maternal Grandfather   . Other Paternal Grandmother  hernia  . COPD Paternal Grandmother   . Diabetes Paternal Grandmother   . Healthy Daughter     ALLERGIES:  is allergic to penicillins and lidocaine.  MEDICATIONS:  Current Outpatient Medications  Medication Sig Dispense Refill  . acetaminophen (TYLENOL) 325 MG tablet Take 2 tablets (650 mg total) by mouth every 6 (six) hours as needed.    Marland Kitchen apixaban (ELIQUIS) 5 MG TABS tablet Take 1 tablet (5 mg total) by mouth 2 (two) times daily. 60 tablet 0  . Cholecalciferol (VITAMIN D3 PO) Take 1 tablet by mouth daily.     . Cyanocobalamin (VITAMIN B-12 IJ) Inject as directed every 30 (thirty) days.    . folic acid (FOLVITE) 1 MG tablet TAKE 1 TABLET BY MOUTH EVERY DAY (Patient taking differently: Take 1 mg by mouth daily. ) 90 tablet 0  . methocarbamol (ROBAXIN) 500 MG tablet Take 2 tablets (1,000 mg total) by mouth every 6 (six) hours as needed for muscle spasms (pain not releived by tylenol or ibuprofen). 40 tablet 1  . MILK THISTLE PO Take 1 tablet by mouth daily.     . ondansetron (ZOFRAN-ODT) 4 MG disintegrating tablet Take 1 tablet (4 mg total) by mouth every 8 (eight) hours as needed for nausea or vomiting. 20 tablet 0  . oxycodone (OXY-IR) 5 MG capsule Take 5 mg by mouth every 6 (six) hours as needed.    Marland Kitchen oxyCODONE (ROXICODONE) 5 MG immediate release tablet Take 1 tablet (5 mg total) by mouth  every 4 (four) hours as needed for up to 5 days. 15 tablet 0  . ursodiol (ACTIGALL) 500 MG tablet TAKE 1 TABLET BY MOUTH TWICE A DAY (Patient taking differently: Take 500 mg by mouth in the morning and at bedtime. ) 60 tablet 3   No current facility-administered medications for this visit.    REVIEW OF SYSTEMS:   Constitutional: Denies fevers, chills or abnormal night sweats Eyes: Denies blurriness of vision, double vision or watery eyes Ears, nose, mouth, throat, and face: Denies mucositis or sore throat Respiratory: Denies cough, dyspnea or wheezes Cardiovascular: Denies palpitation, chest discomfort or lower extremity swelling Gastrointestinal:  Denies nausea, heartburn or change in bowel habits Skin: Denies abnormal skin rashes Lymphatics: Denies new lymphadenopathy or easy bruising Neurological:Denies numbness, tingling or new weaknesses Behavioral/Psych: Mood is stable, no new changes    All other systems were reviewed with the patient and are negative.  PHYSICAL EXAMINATION: ECOG PERFORMANCE STATUS: 2 - Symptomatic, <50% confined to bed  Vitals:   03/13/20 0840  BP: 106/66  Pulse: 95  Resp: 18  Temp: (!) 97.1 F (36.2 C)  SpO2: 99%   Filed Weights   03/13/20 0840  Weight: 156 lb 9.6 oz (71 kg)    GENERAL:alert, no distress and comfortable SKIN: skin color, texture, turgor are normal, no rashes or significant lesions EYES: normal, conjunctiva are pink and non-injected, sclera clear OROPHARYNX:no exudate, no erythema and lips, buccal mucosa, and tongue normal  NECK: supple, thyroid normal size, non-tender, without nodularity LYMPH:  no palpable lymphadenopathy in the cervical, axillary or inguinal LUNGS: clear to auscultation and percussion with normal breathing effort HEART: regular rate & rhythm and no murmurs and no lower extremity edema ABDOMEN:abdomen soft, non-tender and normal bowel sounds Musculoskeletal:no cyanosis of digits and no clubbing  PSYCH: alert &  oriented x 3 with fluent speech NEURO: no focal motor/sensory deficits    LABORATORY DATA:  I have reviewed the data as listed Lab Results  Component  Value Date   WBC 9.3 03/02/2020   HGB 8.6 (L) 03/02/2020   HCT 27.3 (L) 03/02/2020   MCV 103.4 (H) 03/02/2020   PLT 824 (H) 03/02/2020   Lab Results  Component Value Date   NA 136 02/27/2020   K 3.8 02/27/2020   CL 107 02/27/2020   CO2 21 (L) 02/27/2020    RADIOGRAPHIC STUDIES: I have personally reviewed the radiological reports and agreed with the findings in the report.  ASSESSMENT AND PLAN:  Portal vein thrombosis history of prior alcohol abuse w/ alcohol related hepatitis, fell and developed lacerationand leftimpacted distal radius fracture. Tachycardia. 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  02/23/20: Splenectomy (with hemorrhage), liver biopsy : Steatohepatitis Treatment Plan: Lifelong Anti coagulation with Eliquis Hypercoagulability work-up: I discussed the pros and cons of hypercoagulability work-up. We will obtain lupus anticoagulant. Given the lack of family history, I do not believe there is any indication to do extensive work-up. On top of it I recommend lifelong anticoagulation with Eliquis. Extensive work-up is probably not necessary   All questions were answered. The patient knows to call the clinic with any problems, questions or concerns.    Tamsen Meek, MD 03/13/20

## 2020-03-13 NOTE — Progress Notes (Signed)
Cc'ed to pcp °

## 2020-03-13 NOTE — Telephone Encounter (Signed)
Pt returned call advised me she had just spoke to someone else Kara Stewart) and she was getting her some samples together for the pt to pick up. I had also advised the pt I checked her chart and didn't see where she was suppose to have Rx's sent to her pharmacy.

## 2020-03-14 ENCOUNTER — Encounter (HOSPITAL_COMMUNITY): Payer: Self-pay

## 2020-03-14 NOTE — Telephone Encounter (Signed)
Noted pt is aware 

## 2020-03-14 NOTE — Progress Notes (Signed)
Patient called back this morning returning missed call from yesterday. Patient informed of Dr. Earmon Phoenix message. See Dr. Earmon Phoenix telephone encounter in chart.

## 2020-03-15 ENCOUNTER — Ambulatory Visit
Admission: EM | Admit: 2020-03-15 | Discharge: 2020-03-15 | Disposition: A | Discharge status: Home / Self Care | Payer: Medicaid Other

## 2020-03-15 ENCOUNTER — Encounter (HOSPITAL_COMMUNITY): Payer: Self-pay

## 2020-03-15 ENCOUNTER — Encounter (HOSPITAL_COMMUNITY): Payer: Self-pay | Admitting: *Deleted

## 2020-03-15 ENCOUNTER — Emergency Department (HOSPITAL_COMMUNITY): Payer: Medicaid Other

## 2020-03-15 ENCOUNTER — Emergency Department (HOSPITAL_COMMUNITY)
Admission: EM | Admit: 2020-03-15 | Discharge: 2020-03-16 | Disposition: A | Discharge status: Home / Self Care | Payer: Medicaid Other | Attending: Emergency Medicine | Admitting: Emergency Medicine

## 2020-03-15 ENCOUNTER — Other Ambulatory Visit: Payer: Self-pay

## 2020-03-15 DIAGNOSIS — R1012 Left upper quadrant pain: Secondary | ICD-10-CM | POA: Diagnosis not present

## 2020-03-15 DIAGNOSIS — F17210 Nicotine dependence, cigarettes, uncomplicated: Secondary | ICD-10-CM | POA: Diagnosis not present

## 2020-03-15 DIAGNOSIS — K219 Gastro-esophageal reflux disease without esophagitis: Secondary | ICD-10-CM | POA: Insufficient documentation

## 2020-03-15 DIAGNOSIS — Z79899 Other long term (current) drug therapy: Secondary | ICD-10-CM | POA: Insufficient documentation

## 2020-03-15 DIAGNOSIS — R1013 Epigastric pain: Secondary | ICD-10-CM | POA: Insufficient documentation

## 2020-03-15 DIAGNOSIS — J45909 Unspecified asthma, uncomplicated: Secondary | ICD-10-CM | POA: Diagnosis not present

## 2020-03-15 DIAGNOSIS — Z7901 Long term (current) use of anticoagulants: Secondary | ICD-10-CM | POA: Insufficient documentation

## 2020-03-15 DIAGNOSIS — Z86718 Personal history of other venous thrombosis and embolism: Secondary | ICD-10-CM | POA: Diagnosis not present

## 2020-03-15 DIAGNOSIS — R109 Unspecified abdominal pain: Secondary | ICD-10-CM | POA: Diagnosis not present

## 2020-03-15 DIAGNOSIS — E039 Hypothyroidism, unspecified: Secondary | ICD-10-CM | POA: Diagnosis not present

## 2020-03-15 DIAGNOSIS — Z9081 Acquired absence of spleen: Secondary | ICD-10-CM

## 2020-03-15 LAB — FACTOR 5 LEIDEN

## 2020-03-15 LAB — LIPASE, BLOOD: Lipase: 33 U/L (ref 11–51)

## 2020-03-15 LAB — LUPUS ANTICOAGULANT PANEL
DRVVT: 69.9 s — ABNORMAL HIGH (ref 0.0–47.0)
PTT Lupus Anticoagulant: 39.1 s (ref 0.0–51.9)

## 2020-03-15 LAB — CBC
HCT: 37.6 % (ref 36.0–46.0)
Hemoglobin: 11.6 g/dL — ABNORMAL LOW (ref 12.0–15.0)
MCH: 31.2 pg (ref 26.0–34.0)
MCHC: 30.9 g/dL (ref 30.0–36.0)
MCV: 101.1 fL — ABNORMAL HIGH (ref 80.0–100.0)
Platelets: 848 10*3/uL — ABNORMAL HIGH (ref 150–400)
RBC: 3.72 MIL/uL — ABNORMAL LOW (ref 3.87–5.11)
RDW: 16.6 % — ABNORMAL HIGH (ref 11.5–15.5)
WBC: 9.9 10*3/uL (ref 4.0–10.5)
nRBC: 0 % (ref 0.0–0.2)

## 2020-03-15 LAB — COMPREHENSIVE METABOLIC PANEL
ALT: 17 U/L (ref 0–44)
AST: 26 U/L (ref 15–41)
Albumin: 3.4 g/dL — ABNORMAL LOW (ref 3.5–5.0)
Alkaline Phosphatase: 100 U/L (ref 38–126)
Anion gap: 10 (ref 5–15)
BUN: 6 mg/dL (ref 6–20)
CO2: 24 mmol/L (ref 22–32)
Calcium: 9.2 mg/dL (ref 8.9–10.3)
Chloride: 103 mmol/L (ref 98–111)
Creatinine, Ser: 0.54 mg/dL (ref 0.44–1.00)
GFR, Estimated: >60 mL/min (ref >60)
Glucose, Bld: 90 mg/dL (ref 70–99)
Potassium: 3.4 mmol/L — ABNORMAL LOW (ref 3.5–5.1)
Sodium: 137 mmol/L (ref 135–145)
Total Bilirubin: 0.5 mg/dL (ref 0.3–1.2)
Total Protein: 6.9 g/dL (ref 6.5–8.1)

## 2020-03-15 LAB — DRVVT CONFIRM: dRVVT Confirm: 1 ratio (ref 0.8–1.2)

## 2020-03-15 LAB — DRVVT MIX: dRVVT Mix: 52 s — ABNORMAL HIGH (ref 0.0–40.4)

## 2020-03-15 MED ORDER — SODIUM CHLORIDE 0.9 % IV BOLUS
1000.0000 mL | Freq: Once | INTRAVENOUS | Status: AC
  Administered 2020-03-15: 1000 mL via INTRAVENOUS

## 2020-03-15 NOTE — ED Triage Notes (Signed)
S/p spleen removal 3 weeks ago. States her blood pressure was low today. Pain in abdomen

## 2020-03-15 NOTE — Progress Notes (Signed)
Patient called stating that her blood pressure is 99/61 and that she is feeling dizzy. Called patient back and informed her that she needs to contact her PCP or her surgeon. She states that she has contacted her surgeon and is waiting on them to return her call. She states that after her surgery her blood pressure ran low while she was in the hospital.

## 2020-03-15 NOTE — ED Provider Notes (Signed)
[facility] EMERGENCY DEPARTMENT Provider Note   CSN: [id number] Arrival date & time: 03/15/20  1547     History Chief Complaint  Patient presents with  . Abdominal Pain    Kara Stewart is a 44 y.o. female.  Patient is a 45 year old female with past medical history of GERD, SVT, and alcohol related hepatitis.  Patient recently underwent splenectomy secondary to a fall at which time she suffered a grade 5 splenic laceration.  Patient tells me she has been having some discomfort since the surgery, however this has become worse over the past 2 days.  She describes pain to the upper abdomen, worse in the left upper quadrant.  She denies any fevers or chills.  She has felt nauseated but has not vomited.  She denies any diarrhea or constipation.  Patient currently taking Eliquis after experiencing a postoperative portal thrombosis.  The history is provided by the patient.  Abdominal Pain Pain location:  LUQ and epigastric Pain quality: cramping   Pain radiates to:  Does not radiate Pain severity:  Moderate Onset quality:  Gradual Duration:  2 days Timing:  Constant Progression:  Worsening Chronicity:  New Relieved by:  Nothing Worsened by:  Palpation and movement      Past Medical History:  Diagnosis Date  . Acid reflux   . 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 Alcoholic hepatitis without ascites   . Anxiety   . Asthma   . Colitis   . GERD (gastroesophageal reflux disease) 07/21/2012  . Hiatal hernia   . History of alcohol abuse 04/26/2015  . Hypertriglyceridemia   . Hypothyroid   . Iritis    frequent  . Ovarian cyst   . Panic attacks   . SVT (supraventricular tachycardia) (HCC)    last issue 12/2017    Patient Active Problem List   Diagnosis Date Noted  . Portal vein thrombosis 03/12/2020  . Constipation 03/12/2020  . B12 deficiency 03/12/2020  . Folate deficiency 03/12/2020  . Encounter to establish care 03/06/2020  . Deep vein thrombosis of portal vein  03/06/2020  . 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 Macrocytic anemia 03/06/2020  . Closed fracture of wrist 03/06/2020  . Falls 03/06/2020  . Polyarthralgia 03/06/2020  . Splenic laceration 02/23/2020  . Abnormal LFTs 10/10/2018  . 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 Chronic liver disease 11/16/2017  . Alcoholic hepatitis with ascites 10/05/2016  . History of marijuana use 01/20/2015  . Abnormal CT scan, colon 01/08/2015  . Anxiety 11/29/2014  . History of colitis 11/29/2014  . RUQ pain 08/27/2014  . Thyroid nodule 01/11/2014  . SVT (supraventricular tachycardia) (HCC) 09/07/2012  . History of alcohol use 07/21/2012  . GERD (gastroesophageal reflux disease) 07/21/2012  . Fatty liver 10/28/2011    Past Surgical History:  Procedure Laterality Date  . BIOPSY N/A 02/05/2015   Procedure: BIOPSY;  Surgeon: West Bali, MD;  Location: [facility]S;  Service: Endoscopy;  Laterality: N/A;  . COLONOSCOPY    . COLONOSCOPY WITH PROPOFOL N/A 02/05/2015   [id number] HH/mild diverticulosis  . ESOPHAGEAL DILATION N/A 11/21/2015   Procedure: ESOPHAGEAL DILATION;  Surgeon: Corbin Ade, MD;  Location: [facility];  Service: Endoscopy;  Laterality: N/A;  . ESOPHAGOGASTRODUODENOSCOPY  11/06/2011   [name]: MILD Esophagitis/PATENT ESOPHAGEAL Stricture/  Moderate gastritis. Bx no.hpylori or celiac, +gastritis  . ESOPHAGOGASTRODUODENOSCOPY (EGD) WITH PROPOFOL N/A 03/20/2014   [name]: 1. Mild esophagitis & distal esophagela stricture. 2. small hiatal hernia 3. moderate non-erosive gastritis and mild duodenits  .  ESOPHAGOGASTRODUODENOSCOPY (EGD) WITH PROPOFOL N/A 11/21/2015   Dr. Jena Gauss: LA grade B esophagitis, MW tear likely source of hematemesis  . LAPAROSCOPIC TUBAL LIGATION Bilateral 09/14/2018   Procedure: LAPAROSCOPIC TUBAL LIGATION;  Surgeon: Allie Bossier, MD;  Location: [facility];  Service: Gynecology;  Laterality: Bilateral;  . LIVER BIOPSY  02/23/2020   Procedure: OPEN LIVER BIOPSY;   Surgeon: Fritzi Mandes, MD;  Location: [facility] OR;  Service: General;;  . Gaspar Bidding DILATION N/A 03/20/2014   Procedure: SAVORY DILATION;  Surgeon: West Bali, MD;  Location: [facility];  Service: Endoscopy;  Laterality: N/A;  dilated with # 12.8, 14,15,16  . SPLENECTOMY, TOTAL N/A 02/23/2020   Procedure: SPLENECTOMY;  Surgeon: Fritzi Mandes, MD;  Location: [facility] OR;  Service: General;  Laterality: N/A;  . TOOTH EXTRACTION       OB History    Gravida  3   Para  1   Term  1   Preterm      AB  2   Living  1     SAB  1   IAB  1   Ectopic      Multiple  0   Live Births  1           Family History  Problem Relation Age of Onset  . Stomach cancer Paternal Grandfather        colon cancer  . Cancer Paternal Grandfather        throat and esophagus  . Breast cancer Maternal Grandmother   . Cancer Maternal Grandmother        skin  . Anxiety disorder Maternal Grandmother   . Hypertension Mother   . Other Mother        fatty liver  . Hyperlipidemia Mother   . Liver disease Mother        fatty liver, does not drink.   . Sleep apnea Mother   . Other Father        varicose veins; stomach issues; hernia  . Hypertension Father   . Hyperlipidemia Father   . Cancer Father        prostate  . Arthritis Father        rheumatoid  . Neuropathy Father   . Rheum arthritis Father   . Thyroid disease Sister   . Hypertension Sister   . Other Brother        hernia  . Diabetes Maternal Grandfather   . Heart disease Maternal Grandfather   . Other Paternal Grandmother        hernia  . COPD Paternal Grandmother   . Diabetes Paternal Grandmother   . Healthy Daughter     Social History   Tobacco Use  . Smoking status: Current Some Day Smoker    Types: Cigarettes    Last attempt to quit: 12/22/2017    Years since quitting: 2.2  . Smokeless tobacco: Never Used  . Tobacco comment: "might smoke one on a bad day"  Vaping Use  . Vaping Use: Never used  Substance Use Topics  .  Alcohol use: No    Alcohol/week: 0.0 standard drinks    Comment: Sober since 2017  . Drug use: Never    Home Medications Prior to Admission medications   Medication Sig Start Date End Date Taking? Authorizing Provider  acetaminophen (TYLENOL) 325 MG tablet Take 2 tablets (650 mg total) by mouth every 6 (six) hours as needed. 03/03/20   Adam Phenix, PA-C  apixaban Everlene Balls)  5 MG TABS tablet Take 1 tablet (5 mg total) by mouth 2 (two) times daily. 03/03/20   Adam Phenix, PA-C  Cholecalciferol (VITAMIN D3 PO) Take 1 tablet by mouth daily.     [provider]  Cyanocobalamin (VITAMIN B-12 IJ) Inject as directed every 30 (thirty) days.    [provider]  folic acid (FOLVITE) 1 MG tablet TAKE 1 TABLET BY MOUTH EVERY DAY Patient taking differently: Take 1 mg by mouth daily.  01/03/20   Tiffany Kocher, PA-C  methocarbamol (ROBAXIN) 500 MG tablet Take 2 tablets (1,000 mg total) by mouth every 6 (six) hours as needed for muscle spasms (pain not releived by tylenol or ibuprofen). 03/03/20   Adam Phenix, PA-C  MILK THISTLE PO Take 1 tablet by mouth daily.     [provider]  ondansetron (ZOFRAN-ODT) 4 MG disintegrating tablet Take 1 tablet (4 mg total) by mouth every 8 (eight) hours as needed for nausea or vomiting. Patient not taking: Reported on 03/13/2020 03/03/20   Adam Phenix, PA-C  oxycodone (OXY-IR) 5 MG capsule Take 5 mg by mouth every 6 (six) hours as needed.    [provider]  oxyCODONE (ROXICODONE) 5 MG immediate release tablet Take 1 tablet (5 mg total) by mouth every 4 (four) hours as needed for up to 5 days. 03/12/20 03/17/20  Oliver Barre, MD  ursodiol (ACTIGALL) 500 MG tablet Take 1 tablet (500 mg total) by mouth 2 (two) times daily. 03/13/20   Letta Median, PA-C    Allergies    Penicillins and Lidocaine  Review of Systems   Review of Systems  Gastrointestinal: Positive for abdominal pain.  All other systems  reviewed and are negative.   Physical Exam Updated Vital Signs BP (!) 129/94   Pulse (!) 114   Temp 98.2 F (36.8 C)   Resp 18   Ht 5' 3.5" (1.613 m)   LMP 02/23/2020 (Exact Date)   SpO2 97%   BMI 27.31 kg/m   Physical Exam Vitals and nursing note reviewed.  Constitutional:      General: She is not in acute distress.    Appearance: She is well-developed and well-nourished. She is not diaphoretic.  HENT:     Head: Normocephalic and atraumatic.  Cardiovascular:     Rate and Rhythm: Normal rate and regular rhythm.     Heart sounds: No murmur heard. No friction rub. No gallop.   Pulmonary:     Effort: Pulmonary effort is normal. No respiratory distress.     Breath sounds: Normal breath sounds. No wheezing.  Abdominal:     General: Bowel sounds are normal. There is no distension.     Palpations: Abdomen is soft.     Tenderness: There is abdominal tenderness in the epigastric area and left upper quadrant. There is no right CVA tenderness, left CVA tenderness, guarding or rebound.     Comments: There is mild tenderness to the epigastric region and left upper quadrant.  There is no rebound or guarding.  Steri-Strips are in place to the midline incision which appears to be healing well.  There is no drainage or erythema.  The site of the previous JP drain placement in the left upper quadrant appears to be healing well.  There is no drainage or erythema.  Musculoskeletal:        General: Normal range of motion.     Cervical back: Normal range of motion and neck supple.  Skin:  General: Skin is warm and dry.  Neurological:     Mental Status: She is alert and oriented to person, place, and time.     ED Results / Procedures / Treatments   Labs (all labs ordered are listed, but only abnormal results are displayed) Labs Reviewed  COMPREHENSIVE METABOLIC PANEL - Abnormal; Notable for the following components:      Result Value   Potassium 3.4 (*)    Albumin 3.4 (*)    All other  components within normal limits  CBC - Abnormal; Notable for the following components:   RBC 3.72 (*)    Hemoglobin 11.6 (*)    MCV 101.1 (*)    RDW 16.6 (*)    Platelets 848 (*)    All other components within normal limits  LIPASE, BLOOD  URINALYSIS, ROUTINE W REFLEX MICROSCOPIC    EKG None  Radiology No results found.  Procedures Procedures (including critical care time)  Medications Ordered in ED Medications  sodium chloride 0.9 % bolus 1,000 mL (has no administration in time range)    ED Course  I have reviewed the triage vital signs and the nursing notes.  Pertinent labs & imaging results that were available during my care of the patient were reviewed by me and considered in my medical decision making (see chart for details).    MDM Rules/Calculators/A&P  Patient with history of recent splenectomy secondary to trauma.  She presents today with complaints of pain to the left upper quadrant.  The discomfort has been there since the surgery, however seems to be worsening over the past few days.  Patient arrives here afebrile with stable vital signs.  Work-up today shows no elevation of white count, stable hemoglobin of 11.6, and CT scan showing only a small seroma, but no other complicating features.  I highly doubt this is an abscess as there is no surrounding inflammatory change, she is afebrile, and she has no fever.  At this point, I feel as though patient can safely be discharged.  She has a follow-up appointment this week with her surgeon.  I have advised her to keep this appointment and return to the ER if she develops fever, worsening pain, or other new symptoms.  Final Clinical Impression(s) / ED Diagnoses Final diagnoses:  None    Rx / DC Orders ED Discharge Orders    None       Geoffery Lyonselo, Sheryl Towell, MD 03/16/20 0145

## 2020-03-15 NOTE — ED Notes (Signed)
Patient is being discharged from the Urgent Care and sent to the Emergency Department via pov . Per Joaquin Courts, FNP patient is in need of higher level of care due to abd pain/ hypotension. Patient is aware and verbalizes understanding of plan of care. There were no vitals filed for this visit.

## 2020-03-15 NOTE — ED Triage Notes (Signed)
Pt came to urgent care after leaving [facility] ED d/t the wait.  Pt having abd pain and low bp per pt. Pt had spleen removal x 3weeks ago

## 2020-03-16 DIAGNOSIS — R109 Unspecified abdominal pain: Secondary | ICD-10-CM | POA: Diagnosis not present

## 2020-03-16 LAB — URINALYSIS, ROUTINE W REFLEX MICROSCOPIC
Bacteria, UA: NONE SEEN
Bilirubin Urine: NEGATIVE
Glucose, UA: NEGATIVE mg/dL
Hgb urine dipstick: NEGATIVE
Ketones, ur: NEGATIVE mg/dL
Nitrite: NEGATIVE
Protein, ur: NEGATIVE mg/dL
Specific Gravity, Urine: 1.009 (ref 1.005–1.030)
pH: 5 (ref 5.0–8.0)

## 2020-03-16 MED ORDER — IOHEXOL 300 MG/ML  SOLN
100.0000 mL | Freq: Once | INTRAMUSCULAR | Status: AC | PRN
  Administered 2020-03-16: 100 mL via INTRAVENOUS

## 2020-03-16 NOTE — Discharge Instructions (Addendum)
Follow-up with your surgeon later this week as scheduled, and return to the ER if you develop high fever, worsening pain, or other new and concerning symptoms.

## 2020-03-16 NOTE — ED Notes (Signed)
Patient transported to CT 

## 2020-03-18 ENCOUNTER — Telehealth: Payer: Self-pay | Admitting: Orthopedic Surgery

## 2020-03-18 ENCOUNTER — Encounter (HOSPITAL_COMMUNITY): Payer: Self-pay | Admitting: Surgery

## 2020-03-18 LAB — HEMOCHROMATOSIS DNA-PCR(C282Y,H63D)

## 2020-03-18 LAB — PROTHROMBIN GENE MUTATION

## 2020-03-18 MED ORDER — OXYCODONE HCL 5 MG PO TABS
5.0000 mg | ORAL_TABLET | Freq: Four times a day (QID) | ORAL | 0 refills | Status: AC | PRN
Start: 2020-03-18 — End: 2020-03-23

## 2020-03-18 NOTE — Telephone Encounter (Signed)
Thank you  I sent a prescription for Oxycodone #15 to the pharmacy on file.  Please contact her to see if she has picked up this prescription.  If she has already taken all of this medication, then I am concerned that she is taking too much, or is having pain elsewhere.   When I reviewed her chart, she was seen in the ED on 12/10 for abdominal pain, which could be related to her abdominal surgery.  I do not think I should be providing her medication for her abdominal pain.  In addition, her wrist fracture happened over 6 weeks ago, so she should not need to continue taking medicine for this injury.  Thanks   Desirey Keahey A. Dallas Schimke, MD MS [facility] [address] [place],  [place]  [address] Phone: [phone] Fax: [phone]

## 2020-03-18 NOTE — Telephone Encounter (Signed)
I called and she took the prescription for 5 days and she is still having some pain. She feels like a throbbing pain in the hand. She reports the hand is still hurting. She is wanting to know if you could send something to [facility] on [address].  Please advise.

## 2020-03-18 NOTE — Telephone Encounter (Signed)
Patient called to ask if Dr Dallas Schimke may consider ordering a prescription as Tylenol is not helping all. Said talked with Dr Dallas Schimke at appointment that she may need to call back. If medication prescribed, pharmacy is [address] on [address], [place].

## 2020-03-18 NOTE — Telephone Encounter (Signed)
  I have sent an additional prescription to the pharmacy on file.  This will be the last prescription provided for her wrist injury.  Narcotic pain medications are typically reserved for treating acute injuries or for pain immediately postop.  They are highly addicting and as the fracture heals, her pain should continue to improve.  She should work to space out her dosing as much as possible.  Elevate her hand in the cast.  Follow up as previously scheduled.    Kara Stewart A. Dallas Schimke, MD MS [facility] [address] [place],  [place]  [address] Phone: [phone] Fax: [phone]

## 2020-03-20 ENCOUNTER — Ambulatory Visit (INDEPENDENT_AMBULATORY_CARE_PROVIDER_SITE_OTHER): Payer: Medicaid Other | Admitting: Rheumatology

## 2020-03-20 ENCOUNTER — Other Ambulatory Visit: Payer: Self-pay

## 2020-03-20 ENCOUNTER — Encounter: Payer: Self-pay | Admitting: Rheumatology

## 2020-03-20 VITALS — BP 117/71 | HR 96 | Resp 15 | Ht 63.5 in | Wt 157.2 lb

## 2020-03-20 DIAGNOSIS — M2242 Chondromalacia patellae, left knee: Secondary | ICD-10-CM

## 2020-03-20 DIAGNOSIS — F1291 Cannabis use, unspecified, in remission: Secondary | ICD-10-CM

## 2020-03-20 DIAGNOSIS — M797 Fibromyalgia: Secondary | ICD-10-CM | POA: Diagnosis not present

## 2020-03-20 DIAGNOSIS — Z8261 Family history of arthritis: Secondary | ICD-10-CM

## 2020-03-20 DIAGNOSIS — M2241 Chondromalacia patellae, right knee: Secondary | ICD-10-CM | POA: Diagnosis not present

## 2020-03-20 DIAGNOSIS — M545 Low back pain, unspecified: Secondary | ICD-10-CM | POA: Diagnosis not present

## 2020-03-20 DIAGNOSIS — Z8659 Personal history of other mental and behavioral disorders: Secondary | ICD-10-CM

## 2020-03-20 DIAGNOSIS — K743 Primary biliary cirrhosis: Secondary | ICD-10-CM

## 2020-03-20 DIAGNOSIS — G8929 Other chronic pain: Secondary | ICD-10-CM

## 2020-03-20 DIAGNOSIS — G4709 Other insomnia: Secondary | ICD-10-CM | POA: Diagnosis not present

## 2020-03-20 DIAGNOSIS — M533 Sacrococcygeal disorders, not elsewhere classified: Secondary | ICD-10-CM

## 2020-03-20 DIAGNOSIS — M19071 Primary osteoarthritis, right ankle and foot: Secondary | ICD-10-CM | POA: Diagnosis not present

## 2020-03-20 DIAGNOSIS — Z9081 Acquired absence of spleen: Secondary | ICD-10-CM

## 2020-03-20 DIAGNOSIS — I81 Portal vein thrombosis: Secondary | ICD-10-CM

## 2020-03-20 DIAGNOSIS — Z8719 Personal history of other diseases of the digestive system: Secondary | ICD-10-CM

## 2020-03-20 DIAGNOSIS — M542 Cervicalgia: Secondary | ICD-10-CM

## 2020-03-20 DIAGNOSIS — Z8669 Personal history of other diseases of the nervous system and sense organs: Secondary | ICD-10-CM

## 2020-03-20 DIAGNOSIS — E559 Vitamin D deficiency, unspecified: Secondary | ICD-10-CM

## 2020-03-20 DIAGNOSIS — M2550 Pain in unspecified joint: Secondary | ICD-10-CM | POA: Diagnosis not present

## 2020-03-20 DIAGNOSIS — M19041 Primary osteoarthritis, right hand: Secondary | ICD-10-CM

## 2020-03-20 DIAGNOSIS — R5383 Other fatigue: Secondary | ICD-10-CM

## 2020-03-20 DIAGNOSIS — Z87898 Personal history of other specified conditions: Secondary | ICD-10-CM

## 2020-03-20 DIAGNOSIS — I471 Supraventricular tachycardia: Secondary | ICD-10-CM | POA: Diagnosis not present

## 2020-03-20 DIAGNOSIS — M19042 Primary osteoarthritis, left hand: Secondary | ICD-10-CM

## 2020-03-20 DIAGNOSIS — E041 Nontoxic single thyroid nodule: Secondary | ICD-10-CM

## 2020-03-20 DIAGNOSIS — M19072 Primary osteoarthritis, left ankle and foot: Secondary | ICD-10-CM

## 2020-03-20 DIAGNOSIS — R296 Repeated falls: Secondary | ICD-10-CM

## 2020-03-20 NOTE — Patient Instructions (Signed)

## 2020-03-22 ENCOUNTER — Telehealth: Payer: Self-pay | Admitting: Orthopedic Surgery

## 2020-03-22 NOTE — Telephone Encounter (Signed)
Patient requests refill on Oxycodone 5 mgs.  She said she only has 3 pills left and that is not enough to get her through the weekend.  Sig: Take 1 tablet (5 mg total) by mouth every 6 (six) hours as needed for up to 5 days for severe pain.  Patient states she uses [facility] on [address]

## 2020-03-22 NOTE — Telephone Encounter (Signed)
Called patient and was unable to leave a message for patient to call back with provider recommendations.

## 2020-03-22 NOTE — Telephone Encounter (Signed)
Thank you Francesco Runner  She has a distal radius fracture that occurred over a month ago.  Even though the alignment is not perfect, the bone should be healing which would improve her pain.  I do not think she should be taking narcotics for this injury.  According to her recent visit with rheumatology, she has pain throughout her body, and I will not continue to prescribe medications for all of her pain.  She is scheduled to come to clinic next week and we can discuss more at that time.  We may need to consider sending her to pain management.    Thanks   Stacey Sago A. Dallas Schimke, MD MS [facility] [address] [place],  [place]  [address] Phone: [phone] Fax: [phone]

## 2020-03-22 NOTE — Telephone Encounter (Signed)
Please advise 

## 2020-03-26 ENCOUNTER — Ambulatory Visit (INDEPENDENT_AMBULATORY_CARE_PROVIDER_SITE_OTHER): Payer: Medicaid Other | Admitting: Orthopedic Surgery

## 2020-03-26 ENCOUNTER — Other Ambulatory Visit: Payer: Self-pay

## 2020-03-26 ENCOUNTER — Telehealth: Payer: Self-pay | Admitting: Orthopedic Surgery

## 2020-03-26 ENCOUNTER — Encounter: Payer: Self-pay | Admitting: Orthopedic Surgery

## 2020-03-26 ENCOUNTER — Ambulatory Visit: Payer: Medicaid Other

## 2020-03-26 VITALS — BP 116/74 | HR 90 | Ht 63.5 in | Wt 151.0 lb

## 2020-03-26 DIAGNOSIS — M25532 Pain in left wrist: Secondary | ICD-10-CM

## 2020-03-26 MED ORDER — TRAMADOL HCL 50 MG PO TABS
50.0000 mg | ORAL_TABLET | Freq: Four times a day (QID) | ORAL | 1 refills | Status: DC | PRN
Start: 2020-03-26 — End: 2021-03-05

## 2020-03-26 NOTE — Progress Notes (Signed)
Orthopaedic Clinic Return  Assessment: Kara Stewart is a 44 y.o. female with the following: Left distal radius fracture, malunion; now ulnar positive  Plan: Kara Stewart sustained a distal radius fracture 6 weeks ago after a fall.  She has developed a malunion as the fracture subsided, and she is now ulnar positive.  Secondary to multiple medial problems including recent splenectomy and blood clots, she is not interested in surgery, nor is she a good candidate at this time.  The cast helped stabilize her wrist and the swelling significantly improved.  She has healed the fracture and is now dealing with stiffness and deformity.  I have provided her with exercises that she can do on her own.  She was also given a removable wrist brace, that she can wear for support, however I stressed to her that she should not be wearing this all the time.  I have also provided her with a prescription for tramadol, and in an attempt to help control her pain.  We also discussed the possibility of a referral to hand specialist, and depending on how she does over the next 6 weeks, we can discuss this in more detail.  It might be helpful for her to discuss her options with a hand specialist.  Follow-up in 6 weeks.  Meds ordered this encounter  Medications  . traMADol (ULTRAM) 50 MG tablet    Sig: Take 1 tablet (50 mg total) by mouth every 6 (six) hours as needed.    Dispense:  30 tablet    Refill:  1    Body mass index is 26.33 kg/m.  Follow-up: Return in about 6 weeks (around 05/07/2020).   Subjective:  Chief Complaint  Patient presents with  . Wrist Pain    Wrist pain, some throbbing and feels like may be some nerve pain,     History of Present Illness: Kara Stewart is a 43 y.o. female who presents for repeat evaluation of her left wrist.  She sustained a left distal radius fracture 6 weeks ago.  I did not see her until 2 weeks ago.  She was placed in a cast and has done well since then.  She continues  to take pain medication.  She also has been having issues with her ulnar nerve, including radiating pain distally from her elbow. Occasionally she gets some numbness throughout her hand.   Review of Systems: No fevers or chills +numbness No chest pain No shortness of breath No bowel or bladder dysfunction No GI distress No headaches    Objective: BP 116/74   Pulse 90   Ht 5' 3.5" (1.613 m)   Wt 151 lb (68.5 kg)   BMI 26.33 kg/m   Physical Exam:  Alert and oriented.  No acute distress  Left forearm with atrophy.  Deformity at the wrist.  Tolerates passive ROM of 25 degrees flexion and extension.  Limited pronation and supination. Able to make a fist.   IMAGING: I personally ordered and reviewed the following images:  XR left wrist demonstrates a distal radius fracture that has subsided.  There is a large volar fragment that is flexed. She is now ulnar positive.    Impression: Left distal radius fracture with loss of radial height.  Ulnar positive.   Oliver Barre, MD 03/26/2020 2:35 PM

## 2020-03-26 NOTE — Progress Notes (Signed)
[facility] [address], [place] [address]   CLINIC:  Medical Oncology/Hematology  PCP:  Annalee Genta, DO [address] / [place] [address]  [phone]  REASON FOR VISIT:  Portal vein thrombosis  [facility] CONSULT NOTE  Patient Care Team: Anabel Halon, MD as PCP - General (Internal Medicine) West Bali, MD (Inactive) as Consulting Physician (Gastroenterology) Lanelle Bal, DO as Consulting Physician (Internal Medicine)  CHIEF COMPLAINTS/PURPOSE OF CONSULTATION:  Portal vein thrombosis  HISTORY OF PRESENTING ILLNESS:  Kara Stewart is a 44 y.o. female with a diagnosis of a portal vein thrombosis.   She was seen most recently by Dr. Serena Croissant on 03/13/2020 when she presented to the emergency room after multiple falls and lacerations which led to a left distal radius fracture and a splenic rupture.  A CT scan completed on 02/26/2020 showed a splenic rupture with a thrombus in the main portal vein and central intrahepatic portal vein branches, edema around the pancreas, fatty liver with early cirrhosis.  She was taken for splenectomy on 02/23/2020 at [facility].  Postoperatively she developed an ileus which resolved.  She was vaccinated after having her's splenectomy while in the hospital.  Dr. Serena Croissant recommended lifelong anticoagulation with Eliquis.  Labs from 03/13/2020 returned with a hemoglobin of 11.1, hematocrit 35.7, and platelet count of 1,089.  A lupus anticoagulant was collected with no lupus anticoagulant detected.  A factor V Leiden returned negative.  A PTGENE was not detected.  An iron returned at 20 with a iron saturation returning at 5.  A ferritin returned at 34.  She has a prior history of alcohol abuse with alcohol related hepatitis.  She reports that she has not consumed alcohol for approximately 3 years.  She is tolerating Eliquis without any issues of concern.  She denies melena or bright red  blood per rectum.  She has a positive review of systems with report that she has fatigue, has right upper quadrant abdominal pain, has nausea, headaches, numbness in her feet and her left hand, and insomnia, and pain in her back and arms.  She reports that she has no appetite and no energy.  She continues to wear a splint on her left wrist.  She had a cast removed yesterday.  Her wrist is not healing correctly and is malaligned.  Surgery has been recommended.  She continues to have pain in her wrist.  MEDICAL HISTORY:  Past Medical History:  Diagnosis Date  . Acid reflux   . 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 Alcoholic hepatitis without ascites   . Anxiety   . Asthma   . Colitis   . GERD (gastroesophageal reflux disease) 07/21/2012  . Hiatal hernia   . History of alcohol abuse 04/26/2015  . Hypertriglyceridemia   . Hypothyroid   . Iritis    frequent  . Ovarian cyst   . Panic attacks   . SVT (supraventricular tachycardia) (HCC)    last issue 12/2017    SURGICAL HISTORY: Past Surgical History:  Procedure Laterality Date  . BIOPSY N/A 02/05/2015   Procedure: BIOPSY;  Surgeon: West Bali, MD;  Location: [facility]S;  Service: Endoscopy;  Laterality: N/A;  . COLONOSCOPY    . COLONOSCOPY WITH PROPOFOL N/A 02/05/2015   EUM:PNTIR HH/mild diverticulosis  . ESOPHAGEAL DILATION N/A 11/21/2015   Procedure: ESOPHAGEAL DILATION;  Surgeon: Corbin Ade, MD;  Location: [facility];  Service: Endoscopy;  Laterality: N/A;  .  ESOPHAGOGASTRODUODENOSCOPY  11/06/2011   [name]: MILD Esophagitis/PATENT ESOPHAGEAL Stricture/  Moderate gastritis. Bx no.hpylori or celiac, +gastritis  . ESOPHAGOGASTRODUODENOSCOPY (EGD) WITH PROPOFOL N/A 03/20/2014   [name]: 1. Mild esophagitis & distal esophagela stricture. 2. small hiatal hernia 3. moderate non-erosive gastritis and mild duodenits  . ESOPHAGOGASTRODUODENOSCOPY (EGD) WITH PROPOFOL N/A 11/21/2015   Dr. Jena Gauss: LA grade B esophagitis, MW tear likely source of hematemesis   . LAPAROSCOPIC TUBAL LIGATION Bilateral 09/14/2018   Procedure: LAPAROSCOPIC TUBAL LIGATION;  Surgeon: Allie Bossier, MD;  Location: [facility];  Service: Gynecology;  Laterality: Bilateral;  . LIVER BIOPSY  02/23/2020   Procedure: OPEN LIVER BIOPSY;  Surgeon: Fritzi Mandes, MD;  Location: [facility] OR;  Service: General;;  . Gaspar Bidding DILATION N/A 03/20/2014   Procedure: SAVORY DILATION;  Surgeon: West Bali, MD;  Location: [facility];  Service: Endoscopy;  Laterality: N/A;  dilated with # 12.8, 14,15,16  . SPLENECTOMY, TOTAL N/A 02/23/2020   Procedure: SPLENECTOMY;  Surgeon: Fritzi Mandes, MD;  Location: [facility] OR;  Service: General;  Laterality: N/A;  . TOOTH EXTRACTION      SOCIAL HISTORY: Social History   Socioeconomic History  . Marital status: Divorced    Spouse name: Not on file  . Number of children: 0  . Years of education: Not on file  . Highest education level: Not on file  Occupational History  . Occupation: was at [employer] but not working now  Tobacco Use  . Smoking status: Current Some Day Smoker    Types: Cigarettes    Last attempt to quit: 12/22/2017    Years since quitting: 2.2  . Smokeless tobacco: Never Used  . Tobacco comment: "might smoke one on a bad day"  Vaping Use  . Vaping Use: Never used  Substance and Sexual Activity  . Alcohol use: No    Alcohol/week: 0.0 standard drinks    Comment: Sober since 2017  . Drug use: Never  . Sexual activity: Yes    Birth control/protection: Condom  Other Topics Concern  . Not on file  Social History Narrative   Lives alone with a roommate. Dating and in safe relationship. Does not smoke. Denies drug use.    Previous MD: Phill Mutter, NP (Joni Fears, [place])      Social Determinants of Health   Financial Resource Strain: High Risk  . Difficulty of Paying Living Expenses: Hard  Food Insecurity: No Food Insecurity  . Worried About Programme researcher, broadcasting/film/video in the Last Year: Never true  . Ran Out of Food in the Last Year: Never  true  Transportation Needs: No Transportation Needs  . Lack of Transportation (Medical): No  . Lack of Transportation (Non-Medical): No  Physical Activity: Inactive  . Days of Exercise per Week: 0 days  . Minutes of Exercise per Session: 0 min  Stress: Stress Concern Present  . Feeling of Stress : Rather much  Social Connections: Moderately Isolated  . Frequency of Communication with Friends and Family: More than three times a week  . Frequency of Social Gatherings with Friends and Family: Twice a week  . Attends Religious Services: More than 4 times per year  . Active Member of Clubs or Organizations: No  . Attends Banker Meetings: Never  . Marital Status: Divorced  Catering manager Violence: Not At Risk  . Fear of Current or Ex-Partner: No  . Emotionally Abused: No  . Physically Abused: No  . Sexually Abused: No    FAMILY  HISTORY: Family History  Problem Relation Age of Onset  . Stomach cancer Paternal Grandfather        colon cancer  . Cancer Paternal Grandfather        throat and esophagus  . Breast cancer Maternal Grandmother   . Cancer Maternal Grandmother        skin  . Anxiety disorder Maternal Grandmother   . Hypertension Mother   . Other Mother        fatty liver  . Hyperlipidemia Mother   . Liver disease Mother        fatty liver, does not drink.   . Sleep apnea Mother   . Other Father        varicose veins; stomach issues; hernia  . Hypertension Father   . Hyperlipidemia Father   . Cancer Father        prostate  . Arthritis Father        rheumatoid  . Neuropathy Father   . Rheum arthritis Father   . Thyroid disease Sister   . Hypertension Sister   . Other Brother        hernia  . Diabetes Maternal Grandfather   . Heart disease Maternal Grandfather   . Other Paternal Grandmother        hernia  . COPD Paternal Grandmother   . Diabetes Paternal Grandmother   . Healthy Daughter     ALLERGIES:  is allergic to penicillins and  lidocaine.  MEDICATIONS:  Current Outpatient Medications  Medication Sig Dispense Refill  . acetaminophen (TYLENOL) 325 MG tablet Take 2 tablets (650 mg total) by mouth every 6 (six) hours as needed.    Marland Kitchen. apixaban (ELIQUIS) 5 MG TABS tablet Take 1 tablet (5 mg total) by mouth 2 (two) times daily. 60 tablet 0  . Cholecalciferol (VITAMIN D3 PO) Take 1 tablet by mouth daily.     . Cyanocobalamin (VITAMIN B-12 IJ) Inject as directed every 30 (thirty) days.    . folic acid (FOLVITE) 1 MG tablet TAKE 1 TABLET BY MOUTH EVERY DAY (Patient taking differently: Take 1 mg by mouth daily.) 90 tablet 0  . methocarbamol (ROBAXIN) 500 MG tablet Take 2 tablets (1,000 mg total) by mouth every 6 (six) hours as needed for muscle spasms (pain not releived by tylenol or ibuprofen). 40 tablet 1  . MILK THISTLE PO Take 1 tablet by mouth daily.     . ondansetron (ZOFRAN-ODT) 4 MG disintegrating tablet Take 1 tablet (4 mg total) by mouth every 8 (eight) hours as needed for nausea or vomiting. 20 tablet 0  . ursodiol (ACTIGALL) 500 MG tablet Take 1 tablet (500 mg total) by mouth 2 (two) times daily. 60 tablet 3   No current facility-administered medications for this visit.    REVIEW OF SYSTEMS:    Review of Systems  Constitutional: Positive for chills and malaise/fatigue. Negative for diaphoresis, fever and weight loss.  HENT: Negative for congestion and sore throat.   Eyes: Negative.   Respiratory: Negative for cough, hemoptysis, sputum production, shortness of breath and wheezing.   Cardiovascular: Negative for chest pain, palpitations and leg swelling.  Gastrointestinal: Positive for abdominal pain and nausea. Negative for blood in stool, constipation, diarrhea, heartburn, melena and vomiting.  Genitourinary: Negative.   Musculoskeletal: Positive for back pain, joint pain and myalgias. Negative for neck pain.  Skin: Negative.   Neurological: Positive for dizziness, tingling, weakness and headaches.   Endo/Heme/Allergies: Negative.   Psychiatric/Behavioral: Negative for depression and substance abuse.  The patient has insomnia. The patient is not nervous/anxious.     PHYSICAL EXAMINATION: ECOG PERFORMANCE STATUS: 2 - Symptomatic, <50% confined to bed  There were no vitals filed for this visit. There were no vitals filed for this visit.  Physical Exam Constitutional:      General: She is not in acute distress.    Appearance: She is not ill-appearing, toxic-appearing or diaphoretic.  HENT:     Head: Normocephalic and atraumatic.  Eyes:     General: No scleral icterus.       Right eye: No discharge.        Left eye: No discharge.     Conjunctiva/sclera: Conjunctivae normal.  Cardiovascular:     Rate and Rhythm: Normal rate and regular rhythm.     Heart sounds: No murmur heard. No friction rub. No gallop.   Pulmonary:     Effort: Pulmonary effort is normal. No respiratory distress.     Breath sounds: Normal breath sounds. No wheezing, rhonchi or rales.  Abdominal:     General: Bowel sounds are normal. There is no distension.     Tenderness: There is no abdominal tenderness. There is no guarding.  Musculoskeletal:        General: Normal range of motion.     Right lower leg: No edema.     Left lower leg: No edema.  Skin:    General: Skin is warm and dry.     Findings: No lesion or rash.  Neurological:     Mental Status: She is alert.     Coordination: Coordination normal.     Gait: Gait normal.       LABORATORY DATA:  I have reviewed the data as listed Lab Results  Component Value Date   WBC 9.9 03/15/2020   HGB 11.6 (L) 03/15/2020   HCT 37.6 03/15/2020   MCV 101.1 (H) 03/15/2020   PLT 848 (H) 03/15/2020   Lab Results  Component Value Date   NA 137 03/15/2020   K 3.4 (L) 03/15/2020   CL 103 03/15/2020   CO2 24 03/15/2020    RADIOGRAPHIC STUDIES: I have personally reviewed the radiological reports and agreed with the findings in the report.  ASSESSMENT  AND PLAN:  1) Portal vein thrombosis -Patient was told to continue Eliquis.  Plans are for lifelong anticoagulation. -Her hypercoagulable work-up was reviewed with her.  These results were negative.  2) Iron deficiency anemia -A prescription for oral iron was sent to her pharmacy. -She will return for follow-up with Dr. Jill Side in 6 weeks with labs completed prior to her return.      Ceasar Mons, PA-C 03/26/20

## 2020-03-26 NOTE — Telephone Encounter (Signed)
I called the patient and let her know to check with the pharmacy and they could let someone pick up the medication for her as long as they have a drivers license. No other concerns.

## 2020-03-26 NOTE — Telephone Encounter (Signed)
Patient coming in today and Dr. Dallas Schimke will address the medication refill.

## 2020-03-26 NOTE — Telephone Encounter (Signed)
Patient called to inquire about the medication prescribed today - asking if a friend can pick it up for her, as it was not ready at pharmacy the first time she went there to pick it up - said due to her being unable to drive, she wanted to know if this is allowable. Home ph# [phone]

## 2020-03-27 ENCOUNTER — Inpatient Hospital Stay (HOSPITAL_BASED_OUTPATIENT_CLINIC_OR_DEPARTMENT_OTHER): Payer: Medicaid Other | Admitting: Medical

## 2020-03-27 ENCOUNTER — Encounter: Payer: Self-pay | Admitting: Neurology

## 2020-03-27 VITALS — BP 114/70 | HR 95 | Temp 97.2°F | Resp 18 | Wt 150.6 lb

## 2020-03-27 DIAGNOSIS — D509 Iron deficiency anemia, unspecified: Secondary | ICD-10-CM | POA: Diagnosis not present

## 2020-03-27 DIAGNOSIS — D508 Other iron deficiency anemias: Secondary | ICD-10-CM

## 2020-03-27 MED ORDER — POLYSACCHARIDE IRON COMPLEX 100 MG/5ML PO LIQD: 5.0000 mL | Freq: Every day | ORAL | 5 refills | Status: DC

## 2020-04-03 ENCOUNTER — Encounter: Payer: Self-pay | Admitting: Internal Medicine

## 2020-04-04 ENCOUNTER — Other Ambulatory Visit (HOSPITAL_COMMUNITY): Payer: Self-pay | Admitting: *Deleted

## 2020-04-04 ENCOUNTER — Telehealth: Payer: Self-pay | Admitting: Orthopedic Surgery

## 2020-04-04 MED ORDER — APIXABAN 5 MG PO TABS
5.0000 mg | ORAL_TABLET | Freq: Two times a day (BID) | ORAL | 0 refills | Status: DC
Start: 2020-04-04 — End: 2020-07-10

## 2020-04-04 NOTE — Telephone Encounter (Signed)
Kara Stewart called and said that Tramadol is not helping.  She said now that she is using the hand more, she is having more pain and soreness.  She says she needs something stronger than the Tramadol.    She says she needs something stronger than the Tramadol.    She states she uses [facility]

## 2020-04-04 NOTE — Telephone Encounter (Signed)
Please advise 

## 2020-04-06 MED ORDER — OXYCODONE HCL 5 MG PO TABS
5.0000 mg | ORAL_TABLET | Freq: Four times a day (QID) | ORAL | 0 refills | Status: AC | PRN
Start: 2020-04-06 — End: 2020-04-11

## 2020-04-08 ENCOUNTER — Other Ambulatory Visit: Payer: Self-pay

## 2020-04-08 ENCOUNTER — Encounter: Payer: Self-pay | Admitting: Internal Medicine

## 2020-04-08 ENCOUNTER — Encounter: Payer: Medicaid Other | Admitting: Internal Medicine

## 2020-04-08 NOTE — Telephone Encounter (Signed)
I called the patient and was unable to leave a message about Dr. Dallas Schimke recommendations.

## 2020-04-09 NOTE — Progress Notes (Signed)
Erroneous entry

## 2020-04-17 ENCOUNTER — Other Ambulatory Visit: Payer: Self-pay

## 2020-04-17 ENCOUNTER — Ambulatory Visit: Payer: Medicaid Other | Admitting: Internal Medicine

## 2020-04-17 ENCOUNTER — Encounter: Payer: Self-pay | Admitting: Internal Medicine

## 2020-04-17 VITALS — BP 101/65 | HR 97 | Temp 99.4°F | Resp 18 | Ht 64.0 in | Wt 149.8 lb

## 2020-04-17 DIAGNOSIS — S62109D Fracture of unspecified carpal bone, unspecified wrist, subsequent encounter for fracture with routine healing: Secondary | ICD-10-CM | POA: Diagnosis not present

## 2020-04-17 DIAGNOSIS — Z9081 Acquired absence of spleen: Secondary | ICD-10-CM

## 2020-04-17 DIAGNOSIS — I81 Portal vein thrombosis: Secondary | ICD-10-CM

## 2020-04-17 DIAGNOSIS — Z23 Encounter for immunization: Secondary | ICD-10-CM | POA: Diagnosis not present

## 2020-04-17 DIAGNOSIS — J45909 Unspecified asthma, uncomplicated: Secondary | ICD-10-CM

## 2020-04-17 DIAGNOSIS — K743 Primary biliary cirrhosis: Secondary | ICD-10-CM

## 2020-04-17 HISTORY — DX: Unspecified asthma, uncomplicated: J45.909

## 2020-04-17 MED ORDER — ALBUTEROL SULFATE HFA 108 (90 BASE) MCG/ACT IN AERS: 2.0000 | INHALATION_SPRAY | Freq: Four times a day (QID) | RESPIRATORY_TRACT | 0 refills | Status: DC | PRN
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
## 2020-04-17 NOTE — Assessment & Plan Note (Addendum)
Reports chest tightness especially outdoors Likely in the setting of seasonal allergies Advised to take Zyrtec PRN Albuterol inhaler PRN for dyspnea/wheezing

## 2020-04-17 NOTE — Assessment & Plan Note (Signed)
Has a wrist splint Follows up with Orthopedic surgeon

## 2020-04-17 NOTE — Patient Instructions (Addendum)
Please continue to take medications as prescribed.  Okay to take Zyrtec for allergies. Please use humidifier at nighttime. Please use Albuterol inhaler as prescribed for shortness of breath or wheezing.  Okay to take Tylenol for jaw pain. Please get an appointment with Dentist for routine dental evaluation.  Please contact your Surgeon for discussing the abdominal discomfort.

## 2020-04-17 NOTE — Assessment & Plan Note (Signed)
On Eliquis F/u with GI and Heme/Onc.

## 2020-04-17 NOTE — Assessment & Plan Note (Signed)

## 2020-04-17 NOTE — Assessment & Plan Note (Signed)
On Ursodiol Follows up with GI On vitamin supplements
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
## 2020-04-19 ENCOUNTER — Other Ambulatory Visit: Payer: Self-pay | Admitting: Internal Medicine

## 2020-04-23 ENCOUNTER — Telehealth: Payer: Self-pay | Admitting: Gastroenterology

## 2020-04-23 ENCOUNTER — Telehealth: Payer: Medicaid Other | Admitting: Gastroenterology

## 2020-04-23 ENCOUNTER — Other Ambulatory Visit: Payer: Self-pay

## 2020-04-23 NOTE — Telephone Encounter (Signed)
Contacted patient today via telephone regarding office visit this afternoon. She would like to reschedule as she needs a face to face visit.  Please arrange follow-up with any available APP, first available. Last saw Verlon Au. Needs to be seen in person. Thanks!

## 2020-04-24 NOTE — Telephone Encounter (Signed)
PT IS RESCHEDULED TO 2/23 WITH LL AND SHE IS AWARE OF THE APPT

## 2020-05-01 ENCOUNTER — Other Ambulatory Visit (HOSPITAL_COMMUNITY): Payer: Self-pay | Admitting: *Deleted

## 2020-05-01 DIAGNOSIS — D508 Other iron deficiency anemias: Secondary | ICD-10-CM

## 2020-05-02 ENCOUNTER — Other Ambulatory Visit: Payer: Self-pay

## 2020-05-02 ENCOUNTER — Inpatient Hospital Stay (HOSPITAL_COMMUNITY): Payer: Medicaid Other | Attending: Hematology

## 2020-05-02 DIAGNOSIS — F17210 Nicotine dependence, cigarettes, uncomplicated: Secondary | ICD-10-CM | POA: Insufficient documentation

## 2020-05-02 DIAGNOSIS — D509 Iron deficiency anemia, unspecified: Secondary | ICD-10-CM | POA: Insufficient documentation

## 2020-05-02 DIAGNOSIS — Z7901 Long term (current) use of anticoagulants: Secondary | ICD-10-CM | POA: Diagnosis not present

## 2020-05-02 DIAGNOSIS — Z79899 Other long term (current) drug therapy: Secondary | ICD-10-CM | POA: Insufficient documentation

## 2020-05-02 DIAGNOSIS — Z86718 Personal history of other venous thrombosis and embolism: Secondary | ICD-10-CM | POA: Diagnosis present

## 2020-05-02 DIAGNOSIS — D508 Other iron deficiency anemias: Secondary | ICD-10-CM

## 2020-05-02 LAB — VITAMIN B12: Vitamin B-12: 330 pg/mL (ref 180–914)

## 2020-05-02 LAB — CBC WITH DIFFERENTIAL/PLATELET
Basophils Absolute: 0.1 10*3/uL (ref 0.0–0.1)
Basophils Relative: 1 %
Eosinophils Absolute: 0.6 10*3/uL — ABNORMAL HIGH (ref 0.0–0.5)
Eosinophils Relative: 6 %
HCT: 42 % (ref 36.0–46.0)
Hemoglobin: 13.1 g/dL (ref 12.0–15.0)
Lymphocytes Relative: 54 %
Lymphs Abs: 5.3 10*3/uL — ABNORMAL HIGH (ref 0.7–4.0)
MCH: 29 pg (ref 26.0–34.0)
MCHC: 31.2 g/dL (ref 30.0–36.0)
MCV: 92.9 fL (ref 80.0–100.0)
Monocytes Absolute: 0.3 10*3/uL (ref 0.1–1.0)
Monocytes Relative: 3 %
Neutro Abs: 3.5 10*3/uL (ref 1.7–7.7)
Neutrophils Relative %: 36 %
Platelets: 442 10*3/uL — ABNORMAL HIGH (ref 150–400)
RBC: 4.52 MIL/uL (ref 3.87–5.11)
RDW: 15.3 % (ref 11.5–15.5)
WBC Morphology: REACTIVE
WBC: 9.8 10*3/uL (ref 4.0–10.5)
nRBC: 0 % (ref 0.0–0.2)

## 2020-05-02 LAB — IRON AND TIBC
Iron: 45 ug/dL (ref 28–170)
Saturation Ratios: 10 % — ABNORMAL LOW (ref 10.4–31.8)
TIBC: 431 ug/dL (ref 250–450)
UIBC: 386 ug/dL

## 2020-05-02 LAB — COMPREHENSIVE METABOLIC PANEL
ALT: 15 U/L (ref 0–44)
AST: 21 U/L (ref 15–41)
Albumin: 3.9 g/dL (ref 3.5–5.0)
Alkaline Phosphatase: 77 U/L (ref 38–126)
Anion gap: 5 (ref 5–15)
BUN: 7 mg/dL (ref 6–20)
CO2: 25 mmol/L (ref 22–32)
Calcium: 9.5 mg/dL (ref 8.9–10.3)
Chloride: 106 mmol/L (ref 98–111)
Creatinine, Ser: 0.67 mg/dL (ref 0.44–1.00)
GFR, Estimated: >60 mL/min (ref >60)
Glucose, Bld: 100 mg/dL — ABNORMAL HIGH (ref 70–99)
Potassium: 3.8 mmol/L (ref 3.5–5.1)
Sodium: 136 mmol/L (ref 135–145)
Total Bilirubin: 0.6 mg/dL (ref 0.3–1.2)
Total Protein: 7.3 g/dL (ref 6.5–8.1)

## 2020-05-02 LAB — FOLATE: Folate: 13.2 ng/mL (ref >5.9)

## 2020-05-02 LAB — FERRITIN: Ferritin: 15 ng/mL (ref 11–307)

## 2020-05-07 ENCOUNTER — Ambulatory Visit (HOSPITAL_COMMUNITY)
Admission: RE | Admit: 2020-05-07 | Discharge: 2020-05-07 | Disposition: A | Discharge status: Home / Self Care | Payer: Medicaid Other | Source: Ambulatory Visit | Attending: Oncology | Admitting: Oncology

## 2020-05-07 ENCOUNTER — Other Ambulatory Visit: Payer: Self-pay

## 2020-05-07 ENCOUNTER — Inpatient Hospital Stay (HOSPITAL_COMMUNITY): Payer: Medicaid Other | Attending: Hematology and Oncology | Admitting: Oncology

## 2020-05-07 VITALS — BP 100/47 | HR 77 | Temp 97.2°F | Resp 16 | Wt 147.7 lb

## 2020-05-07 DIAGNOSIS — D509 Iron deficiency anemia, unspecified: Secondary | ICD-10-CM | POA: Insufficient documentation

## 2020-05-07 DIAGNOSIS — Z79899 Other long term (current) drug therapy: Secondary | ICD-10-CM | POA: Insufficient documentation

## 2020-05-07 DIAGNOSIS — Z86718 Personal history of other venous thrombosis and embolism: Secondary | ICD-10-CM | POA: Insufficient documentation

## 2020-05-07 DIAGNOSIS — R1013 Epigastric pain: Secondary | ICD-10-CM | POA: Diagnosis not present

## 2020-05-07 DIAGNOSIS — F17210 Nicotine dependence, cigarettes, uncomplicated: Secondary | ICD-10-CM | POA: Insufficient documentation

## 2020-05-07 DIAGNOSIS — E538 Deficiency of other specified B group vitamins: Secondary | ICD-10-CM | POA: Insufficient documentation

## 2020-05-07 DIAGNOSIS — Z9181 History of falling: Secondary | ICD-10-CM | POA: Insufficient documentation

## 2020-05-07 DIAGNOSIS — K7030 Alcoholic cirrhosis of liver without ascites: Secondary | ICD-10-CM | POA: Insufficient documentation

## 2020-05-07 DIAGNOSIS — R109 Unspecified abdominal pain: Secondary | ICD-10-CM | POA: Diagnosis not present

## 2020-05-07 DIAGNOSIS — K7010 Alcoholic hepatitis without ascites: Secondary | ICD-10-CM | POA: Insufficient documentation

## 2020-05-07 DIAGNOSIS — Z9081 Acquired absence of spleen: Secondary | ICD-10-CM | POA: Insufficient documentation

## 2020-05-07 DIAGNOSIS — Z7901 Long term (current) use of anticoagulants: Secondary | ICD-10-CM | POA: Insufficient documentation

## 2020-05-07 DIAGNOSIS — D75839 Thrombocytosis, unspecified: Secondary | ICD-10-CM | POA: Insufficient documentation

## 2020-05-07 NOTE — Progress Notes (Signed)
[facility] [address], [place] [address]   CLINIC:  Medical Oncology/Hematology  PCP:  Annalee Genta, DO [address] / [place] [address]  [phone]  REASON FOR VISIT:  Portal vein thrombosis  [facility] CONSULT NOTE  Patient Care Team: Anabel Halon, MD as PCP - General (Internal Medicine) West Bali, MD (Inactive) as Consulting Physician (Gastroenterology) Lanelle Bal, DO as Consulting Physician (Internal Medicine)  CHIEF COMPLAINTS/PURPOSE OF CONSULTATION:  Portal vein thrombosis  HISTORY OF PRESENTING ILLNESS:  Kara Stewart is a 45 y.o. female with a diagnosis of a portal vein thrombosis.  She was last seen in clinic on 03/27/2020 for follow-up.   She initially presented to the emergency room after a fall back in November 2021.  Work-up included a CT scan on 02/26/2020 which showed a splenic rupture with of thrombus in the main portal vein and central intrahepatic portal vein branches with edema around the pancreas, fatty liver with early cirrhosis.  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  Splenectomy complicated by ileus which eventually resolved on its own.  She was placed on Eliquis at discharge.  Hematology work-up which was negative for lupus anticoagulant, factor V Leiden and PT gene mutation.  Iron levels appear to be low with a ferritin of 34, saturation ratio 5%.  Hemoglobin 11.1.  She has past medical history significant for alcohol abuse, alcohol related hepatitis, colitis, asthma, GERD, B12 deficiency and folate deficiency.  In the interim, she was seen by her PCP for follow-up.  She had persistent musculoskeletal pain secondary to fall and fracture of left wrist and chronic inflammation.  She is followed by orthopedics and rheumatology.  She is followed by GI for GI bleed and early cirrhosis of liver. 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  Today, she reports feeling about the  same.  She is taking oral iron as prescribed.  Endorses epigastric abdominal pain that is worse with bending over or squatting.  States that she feels "something is not right". Feels abdominal has been worse over the past week or so. Suffers from some constipation but has daily bowel movements.  Has some shortness of breath secondary to asthma but uses an inhaler which helps. occasional cough.  Feels fatigued and lacks energy.  Her appetite has declined.  She has lost about 20 pounds since her surgery.  She has some incisional discomfort and numbness.  Uses a cane for stability.   MEDICAL HISTORY:  Past Medical History:  Diagnosis Date  . Acid reflux   . 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 Alcoholic hepatitis without ascites   . Anxiety   . Asthma   . Colitis   . GERD (gastroesophageal reflux disease) 07/21/2012  . Hiatal hernia   . History of alcohol abuse 04/26/2015  . Hypertriglyceridemia   . Hypothyroid   . Iritis    frequent  . Ovarian cyst   . Panic attacks   . SVT (supraventricular tachycardia) (HCC)    last issue 12/2017    SURGICAL HISTORY: Past Surgical History:  Procedure Laterality Date  . BIOPSY N/A 02/05/2015   Procedure: BIOPSY;  Surgeon: West Bali, MD;  Location: [facility];  Service: Endoscopy;  Laterality: N/A;  . COLONOSCOPY    . COLONOSCOPY WITH PROPOFOL N/A 02/05/2015   GNF:AOZHY HH/mild diverticulosis  . ESOPHAGEAL DILATION N/A 11/21/2015   Procedure: ESOPHAGEAL DILATION;  Surgeon: Corbin Ade, MD;  Location: [facility];  Service: Endoscopy;  Laterality: N/A;  . ESOPHAGOGASTRODUODENOSCOPY  11/06/2011   [name]: MILD Esophagitis/PATENT ESOPHAGEAL Stricture/  Moderate gastritis. Bx no.hpylori or celiac, +gastritis  . ESOPHAGOGASTRODUODENOSCOPY (EGD) WITH PROPOFOL N/A 03/20/2014   [name]: 1. Mild esophagitis & distal esophagela stricture. 2. small hiatal hernia 3. moderate non-erosive gastritis and mild duodenits  . ESOPHAGOGASTRODUODENOSCOPY (EGD) WITH PROPOFOL N/A  11/21/2015   Dr. Jena Gauss: LA grade B esophagitis, MW tear likely source of hematemesis  . LAPAROSCOPIC TUBAL LIGATION Bilateral 09/14/2018   Procedure: LAPAROSCOPIC TUBAL LIGATION;  Surgeon: Allie Bossier, MD;  Location: [facility];  Service: Gynecology;  Laterality: Bilateral;  . LIVER BIOPSY  02/23/2020   Procedure: OPEN LIVER BIOPSY;  Surgeon: Fritzi Mandes, MD;  Location: [facility];  Service: General;;  . Gaspar Bidding DILATION N/A 03/20/2014   Procedure: SAVORY DILATION;  Surgeon: West Bali, MD;  Location: [facility];  Service: Endoscopy;  Laterality: N/A;  dilated with # 12.8, 14,15,16  . SPLENECTOMY, TOTAL N/A 02/23/2020   Procedure: SPLENECTOMY;  Surgeon: Fritzi Mandes, MD;  Location: [facility] OR;  Service: General;  Laterality: N/A;  . TOOTH EXTRACTION      SOCIAL HISTORY: Social History   Socioeconomic History  . Marital status: Divorced    Spouse name: Not on file  . Number of children: 0  . Years of education: Not on file  . Highest education level: Not on file  Occupational History  . Occupation: was at [employer] but not working now  Tobacco Use  . Smoking status: Current Some Day Smoker    Types: Cigarettes    Last attempt to quit: 12/22/2017    Years since quitting: 2.3  . Smokeless tobacco: Never Used  . Tobacco comment: "might smoke one on a bad day"  Vaping Use  . Vaping Use: Never used  Substance and Sexual Activity  . Alcohol use: No    Alcohol/week: 0.0 standard drinks    Comment: Sober since 2017  . Drug use: Never  . Sexual activity: Yes    Birth control/protection: Condom  Other Topics Concern  . Not on file  Social History Narrative   Lives alone with a roommate. Dating and in safe relationship. Does not smoke. Denies drug use.    Previous MD: Phill Mutter, NP (Joni Fears, [place])      Social Determinants of Health   Financial Resource Strain: High Risk  . Difficulty of Paying Living Expenses: Hard  Food Insecurity: No Food Insecurity  . Worried About Community education officer in the Last Year: Never true  . Ran Out of Food in the Last Year: Never true  Transportation Needs: No Transportation Needs  . Lack of Transportation (Medical): No  . Lack of Transportation (Non-Medical): No  Physical Activity: Inactive  . Days of Exercise per Week: 0 days  . Minutes of Exercise per Session: 0 min  Stress: Stress Concern Present  . Feeling of Stress : Rather much  Social Connections: Moderately Isolated  . Frequency of Communication with Friends and Family: More than three times a week  . Frequency of Social Gatherings with Friends and Family: Twice a week  . Attends Religious Services: More than 4 times per year  . Active Member of Clubs or Organizations: No  . Attends Banker Meetings: Never  . Marital Status: Divorced  Catering manager Violence: Not At Risk  . Fear of Current or Ex-Partner: No  . Emotionally Abused: No  . Physically Abused: No  . Sexually Abused: No  FAMILY HISTORY: Family History  Problem Relation Age of Onset  . Stomach cancer Paternal Grandfather        colon cancer  . Cancer Paternal Grandfather        throat and esophagus  . Breast cancer Maternal Grandmother   . Cancer Maternal Grandmother        skin  . Anxiety disorder Maternal Grandmother   . Hypertension Mother   . Other Mother        fatty liver  . Hyperlipidemia Mother   . Liver disease Mother        fatty liver, does not drink.   . Sleep apnea Mother   . Other Father        varicose veins; stomach issues; hernia  . Hypertension Father   . Hyperlipidemia Father   . Cancer Father        prostate  . Arthritis Father        rheumatoid  . Neuropathy Father   . Rheum arthritis Father   . Thyroid disease Sister   . Hypertension Sister   . Other Brother        hernia  . Diabetes Maternal Grandfather   . Heart disease Maternal Grandfather   . Other Paternal Grandmother        hernia  . COPD Paternal Grandmother   . Diabetes Paternal  Grandmother   . Healthy Daughter     ALLERGIES:  is allergic to penicillins and lidocaine.  MEDICATIONS:  Current Outpatient Medications  Medication Sig Dispense Refill  . acetaminophen (TYLENOL) 325 MG tablet Take 2 tablets (650 mg total) by mouth every 6 (six) hours as needed.    Marland Kitchen albuterol (VENTOLIN HFA) 108 (90 Base) MCG/ACT inhaler Inhale 2 puffs into the lungs every 6 (six) hours as needed for wheezing or shortness of breath. 8 g 0  . apixaban (ELIQUIS) 5 MG TABS tablet Take 1 tablet (5 mg total) by mouth 2 (two) times daily. 60 tablet 0  . Cholecalciferol (VITAMIN D3 PO) Take 1 tablet by mouth daily.     . Cyanocobalamin (VITAMIN B-12 IJ) Inject as directed every 30 (thirty) days.    . folic acid (FOLVITE) 1 MG tablet TAKE 1 TABLET BY MOUTH EVERY DAY (Patient taking differently: Take 1 mg by mouth daily.) 90 tablet 0  . methocarbamol (ROBAXIN) 500 MG tablet TAKE 1 TABLET BY MOUTH FOUR TIMES DAILY AS NEEDED 60 tablet 1  . MILK THISTLE PO Take 1 tablet by mouth daily.     . ondansetron (ZOFRAN-ODT) 4 MG disintegrating tablet Take 1 tablet (4 mg total) by mouth every 8 (eight) hours as needed for nausea or vomiting. 20 tablet 0  . Polysaccharide Iron Complex 100 MG/5ML LIQD Take 5 mLs by mouth daily. 300 mL 5  . traMADol (ULTRAM) 50 MG tablet Take 1 tablet (50 mg total) by mouth every 6 (six) hours as needed. 30 tablet 1  . ursodiol (ACTIGALL) 500 MG tablet Take 1 tablet (500 mg total) by mouth 2 (two) times daily. 60 tablet 3   No current facility-administered medications for this visit.    REVIEW OF SYSTEMS:    Review of Systems  Constitutional: Negative.  Negative for chills, fever, malaise/fatigue and weight loss.  HENT: Negative for congestion, ear pain and tinnitus.   Eyes: Negative.  Negative for blurred vision and double vision.  Respiratory: Positive for shortness of breath. Negative for cough and sputum production.   Cardiovascular: Negative.  Negative for chest pain,  palpitations and leg swelling.  Gastrointestinal: Positive for abdominal pain, constipation and nausea. Negative for diarrhea and vomiting.  Genitourinary: Negative for dysuria, frequency and urgency.  Musculoskeletal: Positive for falls. Negative for back pain.  Skin: Negative.  Negative for rash.  Neurological: Positive for weakness. Negative for headaches.  Endo/Heme/Allergies: Negative.  Does not bruise/bleed easily.  Psychiatric/Behavioral: Negative for depression. The patient is nervous/anxious. The patient does not have insomnia.     PHYSICAL EXAMINATION: ECOG PERFORMANCE STATUS: 2 - Symptomatic, <50% confined to bed  There were no vitals filed for this visit. There were no vitals filed for this visit.  Physical Exam Constitutional:      General: She is not in acute distress.    Appearance: She is not ill-appearing, toxic-appearing or diaphoretic.  HENT:     Head: Normocephalic and atraumatic.  Eyes:     General: No scleral icterus.       Right eye: No discharge.        Left eye: No discharge.     Conjunctiva/sclera: Conjunctivae normal.  Cardiovascular:     Rate and Rhythm: Normal rate and regular rhythm.     Heart sounds: No murmur heard. No friction rub. No gallop.   Pulmonary:     Effort: Pulmonary effort is normal. No respiratory distress.     Breath sounds: Normal breath sounds. No wheezing, rhonchi or rales.  Abdominal:     General: Bowel sounds are normal. There is no distension.     Tenderness: There is no abdominal tenderness. There is no guarding.  Musculoskeletal:        General: Normal range of motion.     Right lower leg: No edema.     Left lower leg: No edema.  Skin:    General: Skin is warm and dry.     Findings: No lesion or rash.  Neurological:     Mental Status: She is alert.     Coordination: Coordination normal.     Gait: Gait normal.       LABORATORY DATA:  I have reviewed the data as listed Lab Results  Component Value Date   WBC  9.8 05/02/2020   HGB 13.1 05/02/2020   HCT 42.0 05/02/2020   MCV 92.9 05/02/2020   PLT 442 (H) 05/02/2020   Lab Results  Component Value Date   NA 136 05/02/2020   K 3.8 05/02/2020   CL 106 05/02/2020   CO2 25 05/02/2020    RADIOGRAPHIC STUDIES: I have personally reviewed the radiological reports and agreed with the findings in the report.  ASSESSMENT AND PLAN:  1) Portal vein thrombosis -She is stable on Eliquis. -She has missed a few doses secondary to forgetting. -Denies any bleeding or easy bruising. -Hypercoagulable work-up was negative. -Continue Eliquis indefinitely.  2) Iron deficiency anemia -She was started on iron tablets at her last visit. -Labs from 05/02/2020 show declining iron stores with a ferritin of 15 (34), but improvement of her hemoglobin and saturation ratio. -Has significant fatigue which is likely multifactorial. -Given she had a drop in her ferritin along with abdominal bloating recommend she stop oral iron.  We can trial 2 doses of IV Feraheme to see if this improves her ferritin and fatigue.  3.  Folate deficiency: -Stable  -Labs from 05/02/2020 show a folate level of 13.2.  4.  B12 deficiency: -Stable.  -Labs from 05/02/2020 show a B12 level of 330. -Continue monthly B12 injections.  She is doing these at home.  5.  Thrombocytosis: -Likely reactive -Platelet count 442,000 (848,000)  6.  Abdominal pain: -Multifactorial secondary to recent splenectomy, postop ileus, PBC, chronic  constipation and initiating oral iron tablets. -Has had 2 CT scans most recently on 03/16/20 which showed postoperative changes from recent splenectomy and small irregular collection of left upper quadrant hematoma or seroma.  No other evidence for acute intra-abdominal infection. -Recommend she stop OTC iron tablets. -We will trial 2 doses of IV Feraheme to help with ferritin levels and hopefully improve her constipation. -Recommend MiraLAX and Senokot daily to  produce a good bowel movement. -Increase hydration -We will get abdominal x-ray due to worsening epigastric pain x1 week.  -Abdominal x-ray shows nonobstructive bowel gas pattern.  Scattered gas and stool present in the colon to the rectum.  No free air in the abdomen. -She has follow-up with GI later this month.  If abdominal pain has not improved, additional imaging may be warranted.  7.  Primary biliary cirrhosis: -On Ursofiol and follows with GI -Continue calcium and vitamin D  Disposition: -Abdominal x-ray stat -RTC for 2 doses of IV Feraheme. -RTC in 3 months with repeat labs (CBC, CMP, vitamin B12, iron, ferritin) and assessment.   Greater than 50% was spent in counseling and coordination of care with this patient including but not limited to discussion of the relevant topics above (See A&P) including, but not limited to diagnosis and management of acute and chronic medical conditions.    Mauro Kaufmann, NP 05/07/20

## 2020-05-08 ENCOUNTER — Encounter: Payer: Self-pay | Admitting: Orthopedic Surgery

## 2020-05-08 ENCOUNTER — Ambulatory Visit (INDEPENDENT_AMBULATORY_CARE_PROVIDER_SITE_OTHER): Payer: Medicaid Other | Admitting: Orthopedic Surgery

## 2020-05-08 VITALS — BP 110/68 | HR 82 | Ht 64.0 in | Wt 147.0 lb

## 2020-05-08 DIAGNOSIS — S52502P Unspecified fracture of the lower end of left radius, subsequent encounter for closed fracture with malunion: Secondary | ICD-10-CM

## 2020-05-08 DIAGNOSIS — M545 Low back pain, unspecified: Secondary | ICD-10-CM

## 2020-05-08 DIAGNOSIS — G8929 Other chronic pain: Secondary | ICD-10-CM

## 2020-05-08 NOTE — Progress Notes (Signed)
Orthopaedic Clinic Return  Assessment: Kara Stewart is a 45 y.o. female with the following: Left distal radius fracture, malunion; now ulnar positive Low back pain, without radiculopathy  Plan: Left wrist pain and function has improved.  She has an obvious deformity at the wrist but has been able to resume most of her daily activities.  She has some pain with certain motions.  Restricted supination of her wrist with some pain in the medial elbow.  No limitations at this point.  She may benefit from osteotomy at some point and I will reach out to Dr. Merlyn Lot, who was originally consulted for her wrist, before she had a splenectomy and postoperative complications.  She may ultimately require more than an osteotomy of the distal radius.  She has also been having low back pain, with some pain within her buttocks.  No radiating pains into her lower legs.  Recent CT scan of the abdomen demonstrated some mild degenerative changes.  At this point, her symptoms are likely related to these mild radiographic changes and general deconditioning caused by her recent health issues.  Recommend PT for her back.  Discussed general strategies to improve back pain including quitting smoking, increased aerobic activity and PT.  Do not think further imaging warranted at this time.  No referral necessary.   Follow-up: No follow-ups on file.   Subjective:  Chief Complaint  Patient presents with  . Fracture    Lt wrist fx care DOI 02/23/20    History of Present Illness: Kara Stewart is a 45 y.o. female who presents for repeat evaluation of her left wrist. Cast removed at the last visit and she has gradually worked on ROM.  Overall, she is doing much better.  Continues to have reduced function and some pain with activity, but she can tolerate this much better now.  Reports difficulty with supination as well as medial elbow pain.  More recently, she has been complaining of some back pain.  No radiating symptoms.    Review of Systems: No fevers or chills No chest pain No shortness of breath No bowel or bladder dysfunction No GI distress No headaches    Objective: BP 110/68   Pulse 82   Ht 5\' 4"  (1.626 m)   Wt 147 lb (66.7 kg)   LMP 04/15/2020   BMI 25.23 kg/m   Physical Exam:  Alert and oriented.  No acute distress   Deformity at the left wrist.  Ulnar styloid is prominent  Tolerates passive ROM of 25 degrees flexion and extension.  Limited supination. Able to make a fist.   Back with midline tenderness.  Negative SLR, some tightness in back.  Increased pain with hyperextension.  Limited flexion at her waist.   Unable to touch toes while bending forward.   IMAGING: I personally ordered and reviewed the following images:  No new wrist imaging obtained today  Lumbar spine reviewed on CT of abdomen.  No listhesis.  Mild degenerative changes.  Well maintained disc height.    06/13/2020, MD 05/08/2020 3:54 PM

## 2020-05-09 ENCOUNTER — Ambulatory Visit (HOSPITAL_COMMUNITY): Payer: Medicaid Other | Admitting: Hematology

## 2020-05-10 ENCOUNTER — Other Ambulatory Visit: Payer: Self-pay | Admitting: Orthopedic Surgery

## 2020-05-10 DIAGNOSIS — S52502P Unspecified fracture of the lower end of left radius, subsequent encounter for closed fracture with malunion: Secondary | ICD-10-CM

## 2020-05-10 NOTE — Progress Notes (Addendum)
Referral placed for the hand surgeon.   ----- Message -----  From: Betha Loa, MD  Sent: 05/08/2020  5:40 PM EST  To: Oliver Barre, MD   I agree, she might benefit from an osteotomy. I'd be happy to see her to go over her options. If she or someone from your office could give [place] a call at [phone], we will find a time to see her. I can't forward from cone epic to our system, I don't think, but I'll try to leave a note for our staff too. Thanks for seeing her.    Caryn Bee   ----- Message -----  From: Oliver Barre, MD  Sent: 05/08/2020 11:53 AM EST  To: Betha Loa, MD   Good morning Dr. Merlyn Lot   I have been following Mrs. Cone for a left distal radius fracture. You initially saw her as a consult in [facility] for this injury, she was splinted and instructed to follow up as an outpatient. Her injury was sustained in a fall, which resulted in a ruptured spleen requiring a splenectomy. Her postoperative course was complicated by a blood clot. Understandably, these other injuries were a priority and she was not referred to my clinic until 6 weeks after the fall. I have been very up front with her regarding her left wrist, which has healed in a bad position. She is now ulnar positive and has restricted ROM. Her pain has significantly improved and she is able to do most ADLs by now. She is also having some elbow pain which might be incidental, or related to the wrist injury.   From the first visit, I have told her she might want to consider surgery, but she has not been interested because she has not fully recovered from her splenectomy. She remains a high risk patient. I saw her this morning and she has expressed interest in at least a consultation for what a potential surgery and recovery might entail.   Please take a look at her imaging and let me know how you would like to proceed.   Thanks    Mark A. Dallas Schimke, MD MS  [facility]  [address]   [place], [place] [address]  Phone: [phone]  Fax: [phone]

## 2020-05-13 DIAGNOSIS — D509 Iron deficiency anemia, unspecified: Secondary | ICD-10-CM | POA: Insufficient documentation

## 2020-05-13 HISTORY — DX: Iron deficiency anemia, unspecified: D50.9

## 2020-05-14 ENCOUNTER — Other Ambulatory Visit: Payer: Self-pay

## 2020-05-14 ENCOUNTER — Inpatient Hospital Stay (HOSPITAL_COMMUNITY): Payer: Medicaid Other

## 2020-05-14 VITALS — BP 99/61 | HR 66 | Temp 97.7°F | Resp 18

## 2020-05-14 DIAGNOSIS — Z7901 Long term (current) use of anticoagulants: Secondary | ICD-10-CM | POA: Diagnosis not present

## 2020-05-14 DIAGNOSIS — K7030 Alcoholic cirrhosis of liver without ascites: Secondary | ICD-10-CM | POA: Diagnosis not present

## 2020-05-14 DIAGNOSIS — D509 Iron deficiency anemia, unspecified: Secondary | ICD-10-CM

## 2020-05-14 DIAGNOSIS — E538 Deficiency of other specified B group vitamins: Secondary | ICD-10-CM | POA: Diagnosis not present

## 2020-05-14 DIAGNOSIS — Z86718 Personal history of other venous thrombosis and embolism: Secondary | ICD-10-CM | POA: Diagnosis not present

## 2020-05-14 DIAGNOSIS — Z9081 Acquired absence of spleen: Secondary | ICD-10-CM | POA: Diagnosis not present

## 2020-05-14 DIAGNOSIS — F17210 Nicotine dependence, cigarettes, uncomplicated: Secondary | ICD-10-CM | POA: Diagnosis not present

## 2020-05-14 DIAGNOSIS — K7010 Alcoholic hepatitis without ascites: Secondary | ICD-10-CM | POA: Diagnosis not present

## 2020-05-14 DIAGNOSIS — D75839 Thrombocytosis, unspecified: Secondary | ICD-10-CM | POA: Diagnosis not present

## 2020-05-14 DIAGNOSIS — Z9181 History of falling: Secondary | ICD-10-CM | POA: Diagnosis not present

## 2020-05-14 DIAGNOSIS — Z79899 Other long term (current) drug therapy: Secondary | ICD-10-CM | POA: Diagnosis not present

## 2020-05-14 MED ORDER — FAMOTIDINE 20 MG PO TABS
ORAL_TABLET | ORAL | Status: AC
  Filled 2020-05-14: qty 1

## 2020-05-14 MED ORDER — LORATADINE 10 MG PO TABS
10.0000 mg | ORAL_TABLET | Freq: Once | ORAL | Status: AC
  Administered 2020-05-14: 10 mg via ORAL

## 2020-05-14 MED ORDER — SODIUM CHLORIDE 0.9 % IV SOLN: Freq: Once | INTRAVENOUS | Status: AC

## 2020-05-14 MED ORDER — ACETAMINOPHEN 325 MG PO TABS
ORAL_TABLET | ORAL | Status: AC
  Filled 2020-05-14: qty 2

## 2020-05-14 MED ORDER — ACETAMINOPHEN 325 MG PO TABS
650.0000 mg | ORAL_TABLET | Freq: Once | ORAL | Status: AC
  Administered 2020-05-14: 650 mg via ORAL

## 2020-05-14 MED ORDER — FAMOTIDINE 20 MG PO TABS
20.0000 mg | ORAL_TABLET | Freq: Once | ORAL | Status: AC
  Administered 2020-05-14: 20 mg via ORAL

## 2020-05-14 MED ORDER — LORATADINE 10 MG PO TABS
ORAL_TABLET | ORAL | Status: AC
  Filled 2020-05-14: qty 1

## 2020-05-14 MED ORDER — SODIUM CHLORIDE 0.9 % IV SOLN
510.0000 mg | Freq: Once | INTRAVENOUS | Status: AC
  Administered 2020-05-14: 510 mg via INTRAVENOUS
  Filled 2020-05-14: qty 510

## 2020-05-14 NOTE — Progress Notes (Signed)
Patient presents today for Feraheme infusion.  Vital signs WNL.  Patients only complaint today is fatigue.  Treatment given today per MD orders.  Tolerated infusion without adverse affects.  Vital signs stable.  No complaints at this time.  Discharge from clinic ambulatory in stable condition.  Alert and oriented X 3.  Follow up with [facility] as scheduled.

## 2020-05-14 NOTE — Patient Instructions (Signed)
[facility] Discharge Instructions for Patients  Today you received the following.   To help prevent nausea and vomiting after your treatment, we encourage you to take your nausea medication If you develop nausea and vomiting that is not controlled by your nausea medication, call the clinic.   BELOW ARE SYMPTOMS THAT SHOULD BE REPORTED IMMEDIATELY:  *FEVER GREATER THAN 100.5 F  *CHILLS WITH OR WITHOUT FEVER  NAUSEA AND VOMITING THAT IS NOT CONTROLLED WITH YOUR NAUSEA MEDICATION  *UNUSUAL SHORTNESS OF BREATH  *UNUSUAL BRUISING OR BLEEDING  TENDERNESS IN MOUTH AND THROAT WITH OR WITHOUT PRESENCE OF ULCERS  *URINARY PROBLEMS  *BOWEL PROBLEMS  UNUSUAL RASH Items with * indicate a potential emergency and should be followed up as soon as possible.  Feel free to call the clinic should you have any questions or concerns. The clinic phone number is [phone].  Please show the CHEMO ALERT CARD at check-in to the Emergency Department and triage nurse.   

## 2020-05-20 ENCOUNTER — Ambulatory Visit (HOSPITAL_COMMUNITY): Payer: Medicaid Other | Attending: Orthopedic Surgery

## 2020-05-20 ENCOUNTER — Other Ambulatory Visit: Payer: Self-pay

## 2020-05-20 ENCOUNTER — Encounter (HOSPITAL_COMMUNITY): Payer: Self-pay

## 2020-05-20 DIAGNOSIS — R262 Difficulty in walking, not elsewhere classified: Secondary | ICD-10-CM

## 2020-05-20 DIAGNOSIS — M545 Low back pain, unspecified: Secondary | ICD-10-CM | POA: Diagnosis not present

## 2020-05-20 DIAGNOSIS — M6281 Muscle weakness (generalized): Secondary | ICD-10-CM

## 2020-05-20 DIAGNOSIS — G8929 Other chronic pain: Secondary | ICD-10-CM

## 2020-05-20 NOTE — Therapy (Signed)
[facility] [address] [place], [place], [address] Phone: [phone]   Fax:  [phone]  Physical Therapy Evaluation  Patient Details  Name: Kara Stewart MRN: [id number] Date of Birth: 29-Sep-1975 Referring Provider (PT): Thane Edu, MD   Encounter Date: 05/20/2020   PT End of Session - 05/20/20 1522    Visit Number 1    Number of Visits 12    Date for PT Re-Evaluation 07/01/20    Authorization Type [place] med[place] [employer]blue    Progress Note Due on Visit 10    PT Start Time 1431    PT Stop Time 1516    PT Time Calculation (min) 45 min    Activity Tolerance Patient tolerated treatment well    Behavior During Therapy [facility] for tasks assessed/performed           Past Medical History:  Diagnosis Date  . Acid reflux   . 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 Alcoholic hepatitis without ascites   . Anxiety   . Asthma   . Colitis   . GERD (gastroesophageal reflux disease) 07/21/2012  . Hiatal hernia   . History of alcohol abuse 04/26/2015  . Hypertriglyceridemia   . Hypothyroid   . Iritis    frequent  . Ovarian cyst   . Panic attacks   . SVT (supraventricular tachycardia) (HCC)    last issue 12/2017    Past Surgical History:  Procedure Laterality Date  . BIOPSY N/A 02/05/2015   Procedure: BIOPSY;  Surgeon: West Bali, MD;  Location: [facility] ORS;  Service: Endoscopy;  Laterality: N/A;  . COLONOSCOPY    . COLONOSCOPY WITH PROPOFOL N/A 02/05/2015   ONG:EXBMW HH/mild diverticulosis  . ESOPHAGEAL DILATION N/A 11/21/2015   Procedure: ESOPHAGEAL DILATION;  Surgeon: Corbin Ade, MD;  Location: [facility] SUITE;  Service: Endoscopy;  Laterality: N/A;  . ESOPHAGOGASTRODUODENOSCOPY  11/06/2011   [name]: MILD Esophagitis/PATENT ESOPHAGEAL Stricture/  Moderate gastritis. Bx no.hpylori or celiac, +gastritis  . ESOPHAGOGASTRODUODENOSCOPY (EGD) WITH PROPOFOL N/A 03/20/2014   [name]: 1. Mild esophagitis & distal esophagela stricture. 2. small hiatal  hernia 3. moderate non-erosive gastritis and mild duodenits  . ESOPHAGOGASTRODUODENOSCOPY (EGD) WITH PROPOFOL N/A 11/21/2015   Dr. Jena Gauss: LA grade B esophagitis, MW tear likely source of hematemesis  . LAPAROSCOPIC TUBAL LIGATION Bilateral 09/14/2018   Procedure: LAPAROSCOPIC TUBAL LIGATION;  Surgeon: Allie Bossier, MD;  Location: [facility];  Service: Gynecology;  Laterality: Bilateral;  . LIVER BIOPSY  02/23/2020   Procedure: OPEN LIVER BIOPSY;  Surgeon: Fritzi Mandes, MD;  Location: [facility] OR;  Service: General;;  . Gaspar Bidding DILATION N/A 03/20/2014   Procedure: SAVORY DILATION;  Surgeon: West Bali, MD;  Location: [facility];  Service: Endoscopy;  Laterality: N/A;  dilated with # 12.8, 14,15,16  . SPLENECTOMY, TOTAL N/A 02/23/2020   Procedure: SPLENECTOMY;  Surgeon: Fritzi Mandes, MD;  Location: [facility] OR;  Service: General;  Laterality: N/A;  . TOOTH EXTRACTION      There were no vitals filed for this visit.    Subjective Assessment - 05/20/20 1435    Subjective Patient with recent hx of fall on 02/23/20 and fractured left wrist and ruptured spleen. Underwent surgery for spleen and has recently been cleared for activity and lifted weight restrictions.  Patient reports back pain had been pre-existing and reports central low back pain and notes right buttock pain extending to right great toe.  Patient does not identify any caustive factors other than twisting  which exacerbates her back/RLE pain    Patient Stated Goals Be able to get around without back and leg pain, walk to the mailbox (approx 200 ft) without pain/stopping    Currently in Pain? Yes    Pain Score 4     Pain Location Back    Pain Orientation Lower    Pain Descriptors / Indicators Aching;Tingling;Tiring    Pain Type Chronic pain    Pain Radiating Towards RLE    Pain Onset More than a month ago    Pain Frequency Intermittent    Aggravating Factors  bending, lifting, twisting, standing in place    Pain Relieving  Factors reclined position in bed, medication              [facility] PT Assessment - 05/20/20 0001      Assessment   Medical Diagnosis LBP    Referring Provider (PT) Thane EduMark Cairns, MD    Prior Therapy remote hx      Balance Screen   Has the patient fallen in the past 6 months No      Prior Function   Level of Independence Requires assistive device for independence    Vocation Requirements pt has 2 yo child      Observation/Other Assessments   Observations poor posture, poor lifting mechanics observed      ROM / Strength   AROM / PROM / Strength AROM;Strength      AROM   Overall AROM Comments difficulty with arising from forward flexed position, uses hands to "walk" her self back to upright position    AROM Assessment Site Lumbar    Lumbar Flexion 75% limited and painful    Lumbar Extension WNL    Lumbar - Right Side Bend WNL    Lumbar - Left Side Bend painful on right    Lumbar - Right Rotation painful    Lumbar - Left Rotation WNL      Strength   Overall Strength Unable to assess   recent abdominal surgery but demonstrates obvious weakness as she is unable to arise from supine unless heavy use of BUE implemented     Palpation   Spinal mobility hypersensitivy to light palpation of lumbar spine and PA mobility, unable to tolerate    Palpation comment no trigger points or spasm in paraspinals or QL noted      Special Tests    Special Tests Lumbar    Lumbar Tests Slump Test;Straight Leg Raise      Slump test   Findings Positive    Side Right    Comment negative left side      Straight Leg Raise   Findings Positive    Side  Right    Comment negative LLE      Ambulation/Gait   Ambulation/Gait Yes    Ambulation/Gait Assistance 6: Modified independent (Device/Increase time)    Ambulation Distance (Feet) 130 Feet    Assistive device Straight cane    Gait Pattern Antalgic   RLE   Ambulation Surface Level    Gait velocity decreased    Gait Comments 2MWT                       Objective measurements completed on examination: See above findings.               PT Education - 05/20/20 1521    Education Details pt educated on spinal mechanics and posture/position as it relates to disc pressure.  Educated  on symptom monitoring (centralization vs peripheralization)    Person(s) Educated Patient    Methods Explanation    Comprehension Verbalized understanding            PT Short Term Goals - 05/20/20 1532      PT SHORT TERM GOAL #1   Title Patient will report at least 25% improvement in symptoms for improved quality of life.    Time 3    Period Weeks    Status New    Target Date 06/10/20      PT SHORT TERM GOAL #2   Title Patient will be independent with HEP in order to improve functional outcomes.    Time 3    Period Weeks    Status New    Target Date 06/10/20      PT SHORT TERM GOAL #3   Title Patient will demonstrate improved ambulation tolerance as evidenced by distance of 225 ft during with pain not exceeding 2/10    Baseline 130 ft with 4/10 pain using cane    Time 3    Period Weeks    Status New    Target Date 06/10/20             PT Long Term Goals - 05/20/20 1534      PT LONG TERM GOAL #1   Title Patient will report centralization of RLE symptoms with no episodes of pain extending beyond right buttock    Baseline pt outlines entire L5-S1 dermatome for pain map    Time 6    Period Weeks    Status New    Target Date 07/01/20      PT LONG TERM GOAL #2   Title Patient will demonstrate proper lifting mechanics 5/5 observations to reduce risk for reinjury    Baseline poor lifting form    Time 6    Period Weeks    Status New    Target Date 07/01/20                  Plan - 05/20/20 1527    Clinical Impression Statement Patient is a 45 yo lady presenting to physical therapy with chronic LBP and RLE referred pain. She presents with pain limited deficits in lumbar strength, ROM,  endurance, postural impairments, spinal mobility and functional mobility with ADL. She is having to modify and restrict ADL as indicated by use of AD for ambulation as well as subjective information and objective measures which is affecting overall participation. Patient will benefit from skilled physical therapy in order to improve function and reduce impairment.    Personal Factors and Comorbidities Comorbidity 1;Time since onset of injury/illness/exacerbation    Comorbidities PMH    Examination-Activity Limitations Bend;Carry;Squat;Stairs;Lift;Locomotion Level;Transfers    Examination-Participation Restrictions Cleaning;Community Activity;Meal Prep;Laundry    Stability/Clinical Decision Making Stable/Uncomplicated    Clinical Decision Making Low    Rehab Potential Good    PT Frequency 2x / week    PT Duration 6 weeks    PT Treatment/Interventions ADLs/Self Care Home Management;Aquatic Therapy;Cryotherapy;Electrical Stimulation;DME Instruction;Traction;Moist Heat;Gait training;Stair training;Functional mobility training;Therapeutic activities;Therapeutic exercise;Balance training;Patient/family education;Neuromuscular re-education;Manual techniques;Passive range of motion;Taping;Energy conservation;Dry needling;Spinal Manipulations;Joint Manipulations    PT Next Visit Plan Continue with prone progession if this centralizes pain.  Add abominal isometrics (PPT, ab set, etc)    PT Home Exercise Plan prone position, seated with lumbar support    Consulted and Agree with Plan of Care Patient           Patient  will benefit from skilled therapeutic intervention in order to improve the following deficits and impairments:  Abnormal gait,Decreased activity tolerance,Decreased balance,Decreased mobility,Decreased endurance,Decreased strength,Difficulty walking,Impaired perceived functional ability,Postural dysfunction,Improper body mechanics,Pain  Visit Diagnosis: Chronic midline low back pain,  unspecified whether sciatica present  Difficulty in walking, not elsewhere classified  Muscle weakness (generalized)     Problem List Patient Active Problem List   Diagnosis Date Noted  . Iron deficiency anemia 05/13/2020  . Asthma due to seasonal allergies 04/17/2020  . Need for immunization against influenza 04/17/2020  . Constipation 03/12/2020  . B12 deficiency 03/12/2020  . Folate deficiency 03/12/2020  . Encounter to establish care 03/06/2020  . Deep vein thrombosis of portal vein 03/06/2020  . 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 Macrocytic anemia 03/06/2020  . Closed fracture of wrist 03/06/2020  . Falls 03/06/2020  . Polyarthralgia 03/06/2020  . Splenic laceration 02/23/2020  . Abnormal LFTs 10/10/2018  . 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 Chronic liver disease 11/16/2017  . Alcoholic hepatitis with ascites 10/05/2016  . History of marijuana use 01/20/2015  . Abnormal CT scan, colon 01/08/2015  . Anxiety 11/29/2014  . History of colitis 11/29/2014  . RUQ pain 08/27/2014  . Thyroid nodule 01/11/2014  . SVT (supraventricular tachycardia) (HCC) 09/07/2012  . History of alcohol use 07/21/2012  . GERD (gastroesophageal reflux disease) 07/21/2012  . Fatty liver 10/28/2011    3:40 PM, 05/20/20 M. Shary Decamp, PT, DPT Physical Therapist- [place] Office Number: [phone]  [facility] [address] [place], [place], [address] Phone: [phone]   Fax:  [phone]  Name: Kara Stewart MRN: [id number] Date of Birth: 08/13/1975

## 2020-05-21 ENCOUNTER — Encounter (HOSPITAL_COMMUNITY): Payer: Self-pay

## 2020-05-21 ENCOUNTER — Inpatient Hospital Stay (HOSPITAL_COMMUNITY): Payer: Medicaid Other

## 2020-05-21 VITALS — BP 109/66 | HR 68 | Temp 97.8°F | Resp 18

## 2020-05-21 DIAGNOSIS — D509 Iron deficiency anemia, unspecified: Secondary | ICD-10-CM

## 2020-05-21 MED ORDER — ACETAMINOPHEN 325 MG PO TABS
650.0000 mg | ORAL_TABLET | Freq: Once | ORAL | Status: AC
  Administered 2020-05-21: 650 mg via ORAL
  Filled 2020-05-21: qty 2

## 2020-05-21 MED ORDER — SODIUM CHLORIDE 0.9 % IV SOLN
510.0000 mg | Freq: Once | INTRAVENOUS | Status: AC
  Administered 2020-05-21: 510 mg via INTRAVENOUS
  Filled 2020-05-21: qty 510

## 2020-05-21 MED ORDER — SODIUM CHLORIDE 0.9 % IV SOLN: Freq: Once | INTRAVENOUS | Status: AC

## 2020-05-21 MED ORDER — LORATADINE 10 MG PO TABS
10.0000 mg | ORAL_TABLET | Freq: Once | ORAL | Status: AC
  Administered 2020-05-21: 10 mg via ORAL
  Filled 2020-05-21: qty 1

## 2020-05-21 MED ORDER — FAMOTIDINE 20 MG PO TABS
20.0000 mg | ORAL_TABLET | Freq: Once | ORAL | Status: AC
  Administered 2020-05-21: 20 mg via ORAL
  Filled 2020-05-21: qty 1

## 2020-05-21 NOTE — Progress Notes (Signed)
Patient presents today for Feraheme infusion. MAR reviewed. Vitals signs within parameters for treatment. Patient has no complaints of any changes since her last visit. Patient has chronic back pain which she rates a 4/10 today.   Feraheme given today per MD orders. Tolerated infusion without adverse affects. Vital signs stable. No complaints at this time. Discharged from clinic ambulatory in stable condition. Alert and oriented x 3. F/U with [facility] as scheduled.

## 2020-05-21 NOTE — Patient Instructions (Signed)
[facility]  Discharge Instructions:   _______________________________________________________________  Thank you for choosing [facility] to provide your oncology and hematology care.  To afford each patient quality time with our providers, please arrive at least 15 minutes before your scheduled appointment.  You need to re-schedule your appointment if you arrive 10 or more minutes late.  We strive to give you quality time with our providers, and arriving late affects you and other patients whose appointments are after yours.  Also, if you no show three or more times for appointments you may be dismissed from the clinic.  Again, thank you for choosing [facility]. Our hope is that these requests will allow you access to exceptional care and in a timely manner. _______________________________________________________________  If you have questions after your visit, please contact our office at [phone] between the hours of 8:30 a.m. and 5:00 p.m. Voicemails left after 4:30 p.m. will not be returned until the following business day. _______________________________________________________________  For prescription refill requests, have your pharmacy contact our office. _______________________________________________________________  Recommendations made by the consultant and any test results will be sent to your referring physician. _______________________________________________________________ 

## 2020-05-22 DIAGNOSIS — M25522 Pain in left elbow: Secondary | ICD-10-CM | POA: Diagnosis not present

## 2020-05-22 DIAGNOSIS — S52552P Other extraarticular fracture of lower end of left radius, subsequent encounter for closed fracture with malunion: Secondary | ICD-10-CM | POA: Diagnosis not present

## 2020-05-28 ENCOUNTER — Other Ambulatory Visit: Payer: Self-pay

## 2020-05-28 ENCOUNTER — Ambulatory Visit (HOSPITAL_COMMUNITY): Payer: Medicaid Other

## 2020-05-28 ENCOUNTER — Telehealth (HOSPITAL_COMMUNITY): Payer: Self-pay

## 2020-05-28 DIAGNOSIS — R202 Paresthesia of skin: Secondary | ICD-10-CM

## 2020-05-28 NOTE — Telephone Encounter (Signed)
pt not feeling well.

## 2020-05-29 ENCOUNTER — Other Ambulatory Visit: Payer: Self-pay

## 2020-05-29 ENCOUNTER — Encounter: Payer: Self-pay | Admitting: Gastroenterology

## 2020-05-29 ENCOUNTER — Ambulatory Visit (INDEPENDENT_AMBULATORY_CARE_PROVIDER_SITE_OTHER): Payer: Medicaid Other | Admitting: Gastroenterology

## 2020-05-29 ENCOUNTER — Telehealth: Payer: Self-pay | Admitting: *Deleted

## 2020-05-29 VITALS — BP 101/64 | HR 84 | Temp 98.0°F | Ht 64.0 in | Wt 148.0 lb

## 2020-05-29 DIAGNOSIS — I81 Portal vein thrombosis: Secondary | ICD-10-CM | POA: Diagnosis not present

## 2020-05-29 DIAGNOSIS — K219 Gastro-esophageal reflux disease without esophagitis: Secondary | ICD-10-CM | POA: Diagnosis not present

## 2020-05-29 DIAGNOSIS — Z9081 Acquired absence of spleen: Secondary | ICD-10-CM | POA: Diagnosis not present

## 2020-05-29 DIAGNOSIS — R1013 Epigastric pain: Secondary | ICD-10-CM

## 2020-05-29 DIAGNOSIS — R1011 Right upper quadrant pain: Secondary | ICD-10-CM

## 2020-05-29 DIAGNOSIS — K743 Primary biliary cirrhosis: Secondary | ICD-10-CM | POA: Diagnosis not present

## 2020-05-29 DIAGNOSIS — R1310 Dysphagia, unspecified: Secondary | ICD-10-CM

## 2020-05-29 DIAGNOSIS — D509 Iron deficiency anemia, unspecified: Secondary | ICD-10-CM

## 2020-05-29 HISTORY — DX: Epigastric pain: R10.13

## 2020-05-29 HISTORY — DX: Dysphagia, unspecified: R13.10

## 2020-05-29 NOTE — Telephone Encounter (Signed)
Called pt and a guy answered stating patient N/A.  CT scheduled for 3/8 at 11:00am, arrival 10:45am, npo 4 hrs. p/u oral contrast

## 2020-05-29 NOTE — Progress Notes (Deleted)
NEUROLOGY FOLLOW UP OFFICE NOTE  Kenika Sahm [id number]  Assessment/Plan:   ***  Subjective:  Desi Carby is a 45 year old ***-handed female who presents for headaches and frequent falls.  History supplemented by rheumatology *** notes.  She has a complicated medical history including primary osteoarthritis, fibromyalgia, SVT, iron deficiency anemia, portal vein thrombosis (on AC, negative hypercoagulable workup), primary biliary cirrhosis and history of alcohol use.  She has had frequent falls since ***.  She had a fall in *** in which she sustained a left wrist fracture and splenic rupture requiring splenectomy.  She has chronic neck and back pain.  X-rays of cervical spine from December showed mild degenerative changes but no significant disc space narrowing or facet joint arthropathy.  Thoracic spine unremarkable.  She has chondromalacia of both patellae.  She has polyarthralgia.  Autoimmune workup in 2021 was negative, which included negative ANA, ESR 2, RF negative, anti-CCP negative, CK 44.  Headaches have been ongoing since ***  Remote MRI of brain from 10/26/2011 to assess right sided weakness and numbness was personally reviewed and was normal.  Current NSAIDs/analgesics:  Acetaminophen, tramadol Current muscle relaxant:  Robaxin 53m QID PRN Current antiemetic:  Zofran-ODT 460m Past NSAIDs/analgesics:  Excedrin, ibuprofen Past muscle relaxant:  Flexeril Past antiemetic:  Phenergan 2565mast antihypertensive:  Lopressor Past antidepressant:  Prozac  PAST MEDICAL HISTORY: Past Medical History:  Diagnosis Date  . Acid reflux   . Alcoholic hepatitis 07/14/25/7412 Alcoholic hepatitis without ascites   . Anxiety   . Asthma   . Colitis   . GERD (gastroesophageal reflux disease) 07/21/2012  . Hiatal hernia   . History of alcohol abuse 04/26/2015  . Hypertriglyceridemia   . Hypothyroid   . Iritis    frequent  . Ovarian cyst   . Panic attacks   . SVT  (supraventricular tachycardia) ([facility]  last issue 12/2017    MEDICATIONS: Current Outpatient Medications on File Prior to Visit  Medication Sig Dispense Refill  . acetaminophen (TYLENOL) 325 MG tablet Take 2 tablets (650 mg total) by mouth every 6 (six) hours as needed.    . aMarland Kitchenbuterol (VENTOLIN HFA) 108 (90 Base) MCG/ACT inhaler Inhale 2 puffs into the lungs every 6 (six) hours as needed for wheezing or shortness of breath. 8 g 0  . apixaban (ELIQUIS) 5 MG TABS tablet Take 1 tablet (5 mg total) by mouth 2 (two) times daily. 60 tablet 0  . Cyanocobalamin (VITAMIN B-12 IJ) Inject as directed every 30 (thirty) days.    . methocarbamol (ROBAXIN) 500 MG tablet TAKE 1 TABLET BY MOUTH FOUR TIMES DAILY AS NEEDED 60 tablet 1  . ondansetron (ZOFRAN-ODT) 4 MG disintegrating tablet Take 1 tablet (4 mg total) by mouth every 8 (eight) hours as needed for nausea or vomiting. 20 tablet 0  . traMADol (ULTRAM) 50 MG tablet Take 1 tablet (50 mg total) by mouth every 6 (six) hours as needed. 30 tablet 1  . ursodiol (ACTIGALL) 500 MG tablet Take 1 tablet (500 mg total) by mouth 2 (two) times daily. 60 tablet 3   No current facility-administered medications on file prior to visit.    ALLERGIES: Allergies  Allergen Reactions  . Penicillins Anaphylaxis    Has patient had a PCN reaction causing immediate rash, facial/tongue/throat swelling, SOB or lightheadedness with hypotension: yes Has patient had a PCN reaction causing severe rash involving mucus membranes or skin necrosis: No Has patient had a PCN reaction that required  hospitalization Yes Has patient had a PCN reaction occurring within the last 10 years: No If all of the above answers are "NO", then may proceed with Cephalosporin use.   . Lidocaine Other (See Comments)    Can use topical lidocaine but if ingested causes Hallucinations.    FAMILY HISTORY: Family History  Problem Relation Age of Onset  . Stomach cancer Paternal Grandfather         colon cancer  . Cancer Paternal Grandfather        throat and esophagus  . Breast cancer Maternal Grandmother   . Cancer Maternal Grandmother        skin  . Anxiety disorder Maternal Grandmother   . Hypertension Mother   . Other Mother        fatty liver  . Hyperlipidemia Mother   . Liver disease Mother        fatty liver, does not drink.   . Sleep apnea Mother   . Other Father        varicose veins; stomach issues; hernia  . Hypertension Father   . Hyperlipidemia Father   . Cancer Father        prostate  . Arthritis Father        rheumatoid  . Neuropathy Father   . Rheum arthritis Father   . Thyroid disease Sister   . Hypertension Sister   . Other Brother        hernia  . Diabetes Maternal Grandfather   . Heart disease Maternal Grandfather   . Other Paternal Grandmother        hernia  . COPD Paternal Grandmother   . Diabetes Paternal Grandmother   . Healthy Daughter    ***.   Objective:  *** General: No acute distress.  Patient appears ***-groomed.   Head:  Normocephalic/atraumatic Eyes:  Fundi examined but not visualized Neck: supple, no paraspinal tenderness, full range of motion Heart:  Regular rate and rhythm Lungs:  Clear to auscultation bilaterally Back: No paraspinal tenderness Neurological Exam: alert and oriented to person, place, and time. Attention span and concentration intact, recent and remote memory intact, fund of knowledge intact.  Speech fluent and not dysarthric, language intact.  CN II-XII intact. Bulk and tone normal, muscle strength 5/5 throughout.  Sensation to light touch, temperature and vibration intact.  Deep tendon reflexes 2+ throughout, toes downgoing.  Finger to nose and heel to shin testing intact.  Gait normal, Romberg negative.     Metta Clines, DO

## 2020-05-29 NOTE — Progress Notes (Signed)
Primary Care Physician: Lindell Spar, MD  Primary Gastroenterologist:  Elon Alas. Abbey Chatters, DO   Chief Complaint  Patient presents with   Abdominal Pain    Across upper abd and right side   itching   Weakness    Feels weak since spleen removed and has had "muscle loss". Doesn't have appetite. Has been drinking Boost on days doesn't eat much    HPI: Kara Stewart is a 45 y.o. female here for follow-up of chronic liver disease.  Last seen in December 2021.  Patient has a history of remote alcoholic hepatitis, elevated AMA empirically treated with Urso for presumed PBC.  Admission in November for splenic laceration requiring splenectomy status post fall.  Underwent liver biopsy based on nodular appearing liver at time of surgery.  Biopsy showed mildly active steatohepatitis, alcoholic and/or nonalcoholic, with mild to moderate fibrosis.  Mildly active steatohepatitis (grade 1 of 3).  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  Clinical course complicated by development of thrombus within the main portal vein and central intrahepatic portal vein branches.  Currently on Eliquis, followed by hematology.    Still has pain at midline incision from splenectomy. Pain also from epigastric area and radiates along left lower ribs into left flank area.  Pain is constant, feels deep.  Sometimes increased sharp pain.  She fears that something is wrong.  Continues to have chronic intermittent right upper quadrant pain which preceded her splenectomy.  At times stomach can be bloated and hard.  Reports her weight was 170 pounds prior to her surgery, down to 143 pounds. Still very tired.  And weak. Feels like has flu sx all the time.  Feels like she is lost a lot of muscle.  Finds it hard to lift her 30 pound daughter.  Weight restrictions just recently lifted. Trying to eat high protein. Ensure/boost high protein/lower sugar. Eating small portions. If tries to eat more then feels  stuffed. Itching more. No appetite. Eats to survive.  Denies heartburn.  Bowel movements regular.  No blood in the stool or melena.  Patient also complains of dysphagia which she is noted mostly since her surgery.  Labs from January 27 with folate of 13.2, B12 330, iron saturation is 10%, TIBC 431, iron 45, ferritin 15, sodium 135, potassium 3.8, BUN 7, creatinine 0.67, albumin 3.4, AST 21, ALT 15, alk phos 77, total bilirubin 0.6, white blood cell count 9800, hemoglobin 13.1, platelets 442,000  Iron infusion x2 this month. Hasn't noticed any improvement in fatigue since iron infusion. Maybe little more motivation to push herself.  Current Outpatient Medications  Medication Sig Dispense Refill   acetaminophen (TYLENOL) 325 MG tablet Take 2 tablets (650 mg total) by mouth every 6 (six) hours as needed.     albuterol (VENTOLIN HFA) 108 (90 Base) MCG/ACT inhaler Inhale 2 puffs into the lungs every 6 (six) hours as needed for wheezing or shortness of breath. 8 g 0   apixaban (ELIQUIS) 5 MG TABS tablet Take 1 tablet (5 mg total) by mouth 2 (two) times daily. 60 tablet 0   Cyanocobalamin (VITAMIN B-12 IJ) Inject as directed every 30 (thirty) days.     methocarbamol (ROBAXIN) 500 MG tablet TAKE 1 TABLET BY MOUTH FOUR TIMES DAILY AS NEEDED 60 tablet 1   ondansetron (ZOFRAN-ODT) 4 MG disintegrating tablet Take 1 tablet (4 mg total) by mouth every 8 (eight) hours as needed for nausea or vomiting. 20 tablet 0  traMADol (ULTRAM) 50 MG tablet Take 1 tablet (50 mg total) by mouth every 6 (six) hours as needed. 30 tablet 1   ursodiol (ACTIGALL) 500 MG tablet Take 1 tablet (500 mg total) by mouth 2 (two) times daily. 60 tablet 3   No current facility-administered medications for this visit.    Allergies as of 05/29/2020 - Review Complete 05/29/2020  Allergen Reaction Noted   Penicillins Anaphylaxis 10/18/2011   Lidocaine Other (See Comments) 06/22/2014    ROS:  General: Negative for anorexia,  weight loss, fever, chills, +fatigue, +weakness. ENT: Negative for hoarseness, difficulty swallowing , nasal congestion. CV: Negative for chest pain, angina, palpitations, dyspnea on exertion, peripheral edema.  Respiratory: Negative for dyspnea at rest, dyspnea on exertion, cough, sputum, wheezing.  GI: See history of present illness. GU:  Negative for dysuria, hematuria, urinary incontinence, urinary frequency, nocturnal urination.  Endo: Negative for unusual weight change. See hpi   Physical Examination:   BP 101/64    Pulse 84    Temp 98 F (36.7 C) (Temporal)    Ht 5' 4"  (1.626 m)    Wt 148 lb (67.1 kg)    LMP 04/18/2020 (Approximate)    BMI 25.40 kg/m   General: Well-nourished, well-developed in no acute distress.  Eyes: No icterus. Mouth: Oropharyngeal mucosa moist and pink , no lesions erythema or exudate. Lungs: Clear to auscultation bilaterally.  Heart: Regular rate and rhythm, no murmurs rubs or gallops.  Abdomen: Bowel sounds are normal, moderate tenderness along epigastric region/midline incision, mild ruq tenderness, nondistended, no hepatosplenomegaly or masses, no abdominal bruits or hernia , no rebound or guarding.   Extremities: No lower extremity edema. No clubbing or deformities. Neuro: Alert and oriented x 4   Skin: Warm and dry, no jaundice.   Psych: Alert and cooperative, normal mood and affect.  Labs:  Lab Results  Component Value Date   CREATININE 0.67 05/02/2020   BUN 7 05/02/2020   NA 136 05/02/2020   K 3.8 05/02/2020   CL 106 05/02/2020   CO2 25 05/02/2020   Lab Results  Component Value Date   ALT 15 05/02/2020   AST 21 05/02/2020   ALKPHOS 77 05/02/2020   BILITOT 0.6 05/02/2020   Lab Results  Component Value Date   WBC 9.8 05/02/2020   HGB 13.1 05/02/2020   HCT 42.0 05/02/2020   MCV 92.9 05/02/2020   PLT 442 (H) 05/02/2020   Lab Results  Component Value Date   LIPASE 33 03/15/2020     Imaging Studies: DG Abd 2 Views  Result Date:  05/07/2020 CLINICAL DATA:  Abdominal discomfort, traumatic splenectomy 02/2020 EXAM: ABDOMEN - 2 VIEW COMPARISON:  None. FINDINGS: The bowel gas pattern is normal. Scattered gas and stool present in the colon to the rectum. There is no evidence of free air. No radio-opaque calculi or other significant radiographic abnormality is seen. Bilateral tubal ligation clips. IMPRESSION: Nonobstructive bowel gas pattern. Scattered gas and stool present in the colon to the rectum. No free air in the abdomen. Electronically Signed   By: Eddie Candle M.D.   On: 05/07/2020 11:35   Assessment/plan:  Pleasant 45 year old female with history of chronic liver disease, alcoholic hepatitis, elevated AMA empirically treated with Urso for 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  Admission back in November for splenic laceration requiring splenectomy.  Underwent liver biopsy based on nodular appearing liver at time of surgery.  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  Chronic liver disease:  History of remote prior alcoholic hepatitis, no alcohol in over 4 years.  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  Suspected significant fibrosis/cirrhosis given grossly nodular appearing liver at time of recent laparotomy.Biopsy showed mildly active steatohepatitis, alcoholic and/or nonalcoholic, with mild to moderate fibrosis. Mildly active steatohepatitis (grade 1 of 3). 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   Plans to continue Urso 500 mg twice daily.  Will need to consider updating EGD in the upcoming months to screen for varices.  So far she appears to have preserved hepatic function.  Current meld unavailable without recent INR.  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  Currently on Eliquis 5 mg twice daily.  Followed by hematology.  Abdominal pain: At least in part  related to prior splenectomy.  Persistent chronic right upper quadrant pain as well.  Recommend CT imaging abdomen pelvis with contrast given recent complicated clinical course and persisting pain.  Will not rule out the possibility of upper endoscopy to further evaluate upper abdominal pain.  Await CT findings.  Dysphagia: Typical reflux well controlled.  Symptoms noted after splenectomy, initially patient felt it was likely related to intubation.  Symptoms persist.  May require EGD for further evaluation.  Await CT findings.

## 2020-05-29 NOTE — Patient Instructions (Signed)
1. CT scan of your abdomen as scheduled. We will discuss results as available.

## 2020-05-30 ENCOUNTER — Ambulatory Visit: Payer: Medicaid Other | Admitting: Neurology

## 2020-05-30 ENCOUNTER — Encounter: Payer: Self-pay | Admitting: Gastroenterology

## 2020-05-30 NOTE — Progress Notes (Signed)
Cc'ed to pcp °

## 2020-05-30 NOTE — Telephone Encounter (Signed)
Called patient again and n/a. [facility] message sent with appt details  PA submitted via availity. Tracking# [id number].

## 2020-05-31 ENCOUNTER — Ambulatory Visit (HOSPITAL_COMMUNITY): Payer: Medicaid Other

## 2020-05-31 ENCOUNTER — Telehealth (HOSPITAL_COMMUNITY): Payer: Self-pay

## 2020-05-31 NOTE — Telephone Encounter (Signed)
Called. No answer  1:44 PM, 05/31/20 M. Shary Decamp, PT, DPT Physical Therapist- [place] Office Number: [phone]

## 2020-06-03 ENCOUNTER — Ambulatory Visit (HOSPITAL_COMMUNITY): Payer: Medicaid Other | Admitting: Physical Therapy

## 2020-06-03 ENCOUNTER — Telehealth (HOSPITAL_COMMUNITY): Payer: Self-pay | Admitting: Physical Therapy

## 2020-06-03 ENCOUNTER — Encounter (HOSPITAL_COMMUNITY): Payer: Self-pay

## 2020-06-03 NOTE — Telephone Encounter (Signed)
Pt did not show for appt (second consecutive NS).  Pt reported she was unaware of today's appt and that her daugther had spilled something on her old schedule.  Reminded of next appt on Wednesday at 11:15 in which pt reported she would be here.  Lurena Nida, PTA/CLT [phone]

## 2020-06-05 ENCOUNTER — Ambulatory Visit (INDEPENDENT_AMBULATORY_CARE_PROVIDER_SITE_OTHER): Payer: Medicaid Other | Admitting: Orthopedic Surgery

## 2020-06-05 ENCOUNTER — Ambulatory Visit (HOSPITAL_COMMUNITY): Payer: Medicaid Other | Attending: Orthopedic Surgery

## 2020-06-05 ENCOUNTER — Telehealth: Payer: Self-pay | Admitting: Orthopedic Surgery

## 2020-06-05 ENCOUNTER — Encounter (HOSPITAL_COMMUNITY): Payer: Self-pay

## 2020-06-05 ENCOUNTER — Encounter: Payer: Self-pay | Admitting: Orthopedic Surgery

## 2020-06-05 ENCOUNTER — Other Ambulatory Visit: Payer: Self-pay

## 2020-06-05 VITALS — BP 112/76 | HR 79 | Ht 64.0 in | Wt 145.0 lb

## 2020-06-05 DIAGNOSIS — M545 Low back pain, unspecified: Secondary | ICD-10-CM | POA: Insufficient documentation

## 2020-06-05 DIAGNOSIS — G8929 Other chronic pain: Secondary | ICD-10-CM | POA: Insufficient documentation

## 2020-06-05 DIAGNOSIS — M6281 Muscle weakness (generalized): Secondary | ICD-10-CM | POA: Diagnosis not present

## 2020-06-05 DIAGNOSIS — R262 Difficulty in walking, not elsewhere classified: Secondary | ICD-10-CM | POA: Diagnosis not present

## 2020-06-05 DIAGNOSIS — M67442 Ganglion, left hand: Secondary | ICD-10-CM

## 2020-06-05 DIAGNOSIS — M6740 Ganglion, unspecified site: Secondary | ICD-10-CM

## 2020-06-05 NOTE — Therapy (Signed)
[facility] [address] [place], [place], [address] Phone: [phone]   Fax:  [phone]  Physical Therapy Treatment  Patient Details  Name: Kara Stewart MRN: [id number] Date of Birth: May 25, 1975 Referring Provider (PT): Thane Edu, MD   Encounter Date: 06/05/2020   PT End of Session - 06/05/20 1215    Visit Number 2    Number of Visits 12    Date for PT Re-Evaluation 07/01/20    Authorization Type [place] medicaid healthyblue    Progress Note Due on Visit 10    PT Start Time 1116    PT Stop Time 1212    PT Time Calculation (min) 56 min    Activity Tolerance Patient tolerated treatment well    Behavior During Therapy [facility] for tasks assessed/performed           Past Medical History:  Diagnosis Date  . Acid reflux   . 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 Alcoholic hepatitis without ascites   . Anxiety   . Asthma   . Colitis   . GERD (gastroesophageal reflux disease) 07/21/2012  . Hiatal hernia   . History of alcohol abuse 04/26/2015  . Hypertriglyceridemia   . Hypothyroid   . Iritis    frequent  . Ovarian cyst   . Panic attacks   . SVT (supraventricular tachycardia) (HCC)    last issue 12/2017    Past Surgical History:  Procedure Laterality Date  . BIOPSY N/A 02/05/2015   Procedure: BIOPSY;  Surgeon: West Bali, MD;  Location: [facility] ORS;  Service: Endoscopy;  Laterality: N/A;  . COLONOSCOPY    . COLONOSCOPY WITH PROPOFOL N/A 02/05/2015   EQA:STMHD HH/mild diverticulosis  . ESOPHAGEAL DILATION N/A 11/21/2015   Procedure: ESOPHAGEAL DILATION;  Surgeon: Corbin Ade, MD;  Location: [facility] ENDO SUITE;  Service: Endoscopy;  Laterality: N/A;  . ESOPHAGOGASTRODUODENOSCOPY  11/06/2011   [name]: MILD Esophagitis/PATENT ESOPHAGEAL Stricture/  Moderate gastritis. Bx no.hpylori or celiac, +gastritis  . ESOPHAGOGASTRODUODENOSCOPY (EGD) WITH PROPOFOL N/A 03/20/2014   [name]: 1. Mild esophagitis & distal esophagela stricture. 2. small hiatal  hernia 3. moderate non-erosive gastritis and mild duodenits  . ESOPHAGOGASTRODUODENOSCOPY (EGD) WITH PROPOFOL N/A 11/21/2015   Dr. Jena Gauss: LA grade B esophagitis, MW tear likely source of hematemesis  . LAPAROSCOPIC TUBAL LIGATION Bilateral 09/14/2018   Procedure: LAPAROSCOPIC TUBAL LIGATION;  Surgeon: Allie Bossier, MD;  Location:  SURGERY CENTER;  Service: Gynecology;  Laterality: Bilateral;  . LIVER BIOPSY  02/23/2020   Procedure: OPEN LIVER BIOPSY;  Surgeon: Fritzi Mandes, MD;  Location: [facility] OR;  Service: General;;  . Gaspar Bidding DILATION N/A 03/20/2014   Procedure: SAVORY DILATION;  Surgeon: West Bali, MD;  Location: [facility] ORS;  Service: Endoscopy;  Laterality: N/A;  dilated with # 12.8, 14,15,16  . SPLENECTOMY, TOTAL N/A 02/23/2020   Procedure: SPLENECTOMY;  Surgeon: Fritzi Mandes, MD;  Location: [facility] OR;  Service: General;  Laterality: N/A;  . TOOTH EXTRACTION      There were no vitals filed for this visit.   Subjective Assessment - 06/05/20 1213    Subjective Pt reports less leg pain but notes increase in upper back pain/tension.    Currently in Pain? Yes    Pain Score 4     Pain Location Back    Pain Orientation Lower    Pain Descriptors / Indicators Aching;Tingling    Pain Type Chronic pain    Pain Radiating Towards RLE- posterior thigh to great toe  [facility] PT Assessment - 06/05/20 0001      Assessment   Medical Diagnosis LBP    Referring Provider (PT) Thane Edu, MD      Straight Leg Raise   Findings Positive    Side  Right    Comment at outset RLE symptoms experienced with 40 degrees RLE in SLR.  Following prone position x 11 min able to tolerate up to 60-65 degrees RLE SLR                         [facility] Adult PT Treatment/Exercise - 06/05/20 0001      Exercises   Exercises Lumbar      Lumbar Exercises: Stretches   Other Lumbar Stretch Exercise prone x 7 min followed by prone on elbows x 4 min      Lumbar Exercises: Seated    Sit to Stand 10 reps   with abdominal set     Lumbar Exercises: Supine   Ab Set 20 reps;2 seconds                  PT Education - 06/05/20 1214    Education Details education, discussion, and visual model/demonstration of lumbar spine and discal mechanics and emphasis on positional changes    Person(s) Educated Patient    Methods Explanation;Demonstration;Handout    Comprehension Verbalized understanding            PT Short Term Goals - 05/20/20 1532      PT SHORT TERM GOAL #1   Title Patient will report at least 25% improvement in symptoms for improved quality of life.    Time 3    Period Weeks    Status New    Target Date 06/10/20      PT SHORT TERM GOAL #2   Title Patient will be independent with HEP in order to improve functional outcomes.    Time 3    Period Weeks    Status New    Target Date 06/10/20      PT SHORT TERM GOAL #3   Title Patient will demonstrate improved ambulation tolerance as evidenced by distance of 225 ft during with pain not exceeding 2/10    Baseline 130 ft with 4/10 pain using cane    Time 3    Period Weeks    Status New    Target Date 06/10/20             PT Long Term Goals - 05/20/20 1534      PT LONG TERM GOAL #1   Title Patient will report centralization of RLE symptoms with no episodes of pain extending beyond right buttock    Baseline pt outlines entire L5-S1 dermatome for pain map    Time 6    Period Weeks    Status New    Target Date 07/01/20      PT LONG TERM GOAL #2   Title Patient will demonstrate proper lifting mechanics 5/5 observations to reduce risk for reinjury    Baseline poor lifting form    Time 6    Period Weeks    Status New    Target Date 07/01/20                 Plan - 06/05/20 1218    Clinical Impression Statement Pt tolerating tx session well and demonstrates good understanding following lengthy discussion/education regarding lumbar spine biomechanics.  Demonstrates good  extension-bias for centralization of RLE  symptoms as evidenced by test and re-test of SLR maneuver.  Continued tx sessions indicated to minimize pain and improve trunk stability to reduce pain and risk for reinjury    Personal Factors and Comorbidities Comorbidity 1;Time since onset of injury/illness/exacerbation    Comorbidities PMH    Examination-Activity Limitations Bend;Carry;Squat;Stairs;Lift;Locomotion Level;Transfers    Examination-Participation Restrictions Cleaning;Community Activity;Meal Prep;Laundry    Stability/Clinical Decision Making Stable/Uncomplicated    Rehab Potential Good    PT Frequency 2x / week    PT Duration 6 weeks    PT Treatment/Interventions ADLs/Self Care Home Management;Aquatic Therapy;Cryotherapy;Electrical Stimulation;DME Instruction;Traction;Moist Heat;Gait training;Stair training;Functional mobility training;Therapeutic activities;Therapeutic exercise;Balance training;Patient/family education;Neuromuscular re-education;Manual techniques;Passive range of motion;Taping;Energy conservation;Dry needling;Spinal Manipulations;Joint Manipulations    PT Next Visit Plan Continue with prone progession if this centralizes pain.  Add abominal isometrics (PPT, ab set, etc)    PT Home Exercise Plan prone position, seated with lumbar support    Consulted and Agree with Plan of Care Patient           Patient will benefit from skilled therapeutic intervention in order to improve the following deficits and impairments:  Abnormal gait,Decreased activity tolerance,Decreased balance,Decreased mobility,Decreased endurance,Decreased strength,Difficulty walking,Impaired perceived functional ability,Postural dysfunction,Improper body mechanics,Pain  Visit Diagnosis: Chronic midline low back pain, unspecified whether sciatica present  Difficulty in walking, not elsewhere classified  Muscle weakness (generalized)     Problem List Patient Active Problem List   Diagnosis Date  Noted  . Abdominal pain, epigastric 05/29/2020  . Dysphagia 05/29/2020  . Iron deficiency anemia 05/13/2020  . Asthma due to seasonal allergies 04/17/2020  . Need for immunization against influenza 04/17/2020  . Constipation 03/12/2020  . B12 deficiency 03/12/2020  . Folate deficiency 03/12/2020  . Encounter to establish care 03/06/2020  . Deep vein thrombosis of portal vein 03/06/2020  . 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 Macrocytic anemia 03/06/2020  . Closed fracture of wrist 03/06/2020  . Falls 03/06/2020  . Polyarthralgia 03/06/2020  . Splenic laceration 02/23/2020  . Abnormal LFTs 10/10/2018  . 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 Chronic liver disease 11/16/2017  . Alcoholic hepatitis with ascites 10/05/2016  . History of marijuana use 01/20/2015  . Abnormal CT scan, colon 01/08/2015  . Anxiety 11/29/2014  . History of colitis 11/29/2014  . RUQ pain 08/27/2014  . Thyroid nodule 01/11/2014  . SVT (supraventricular tachycardia) (HCC) 09/07/2012  . History of alcohol use 07/21/2012  . GERD (gastroesophageal reflux disease) 07/21/2012  . Fatty liver 10/28/2011   12:21 PM, 06/05/20 M. Shary Decamp, PT, DPT Physical Therapist- [place] Office Number: [phone]  [facility] [address] [place], [place], [address] Phone: [phone]   Fax:  [phone]  Name: Kara Stewart MRN: [id number] Date of Birth: 1976-02-28

## 2020-06-05 NOTE — Progress Notes (Signed)
Orthopaedic Clinic Return  Assessment: Kara Stewart is a 45 y.o. 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   Plan: Left distal radius malunion and carpal tunnel being managed by Dr. Merlyn Lot.  She is awaiting EMG studies prior to further discussion regarding surgery.  Has developed mild swelling and pain in the dorsal hand.  Most consistent with an evolving ganglion cyst.  Continue to monitor and manage symptoms. As long as there are no sings of an infection, no treatment needed.  Continue PT for back pain and LUE.  Follow up as needed.   Follow-up: No follow-ups on file.   Subjective:  Chief Complaint  Patient presents with  . Hand Pain    Nodule on back of left hand for  1 week. Starting to cause pain going up the forearm    History of Present Illness: Kara Stewart is a 45 y.o. female who presents for repeat evaluation of her left hand. She has been evaluated by Dr. Merlyn Lot for her left wrist injury and associated nerve irritation.  She is awaiting an appointment for an EMG.  Over the past 1-2 weeks, she has noticed some pain in her left hand with point tenderness.  She has noticed a small swelling.  No overlying skin changes.  Health has remained stable.    Review of Systems: No fevers or chills No chest pain No shortness of breath No bowel or bladder dysfunction No GI distress No headaches    Objective: BP 112/76   Pulse 79   Ht 5\' 4"  (1.626 m)   Wt 145 lb (65.8 kg)   BMI 24.89 kg/m   Physical Exam:  Alert and oriented.  No acute distress   Deformity at the left wrist.  Ulnar styloid is prominent  Small swelling dorsum of hand.  TTP.  No skin changes.  Some radiating pains.  Decreased sensation to tips of fingers.  Fingers are warm and well perfused.   IMAGING: I personally ordered and reviewed the following images:  No new imaging obtained  today   , MD 06/05/2020 9:41 PM

## 2020-06-10 ENCOUNTER — Telehealth: Payer: Self-pay | Admitting: Internal Medicine

## 2020-06-10 ENCOUNTER — Ambulatory Visit (HOSPITAL_COMMUNITY): Payer: Medicaid Other

## 2020-06-10 NOTE — Telephone Encounter (Signed)
Received approval for CT abd/pelvis w/contrast. PA# [id number], valid 05/30/20-07/29/20.  CT rescheduled to 07/04/20 at 5:00pm, arrive at 4:45pm. NPO 4 hours prior to test. Pickup contrast prior to test.   Tried to call pt to inform her of appt. Call unable to be completed x2. MyChart message sent to pt to inform her of appt. Appt letter mailed.

## 2020-06-10 NOTE — Telephone Encounter (Signed)
Routing to Kara Stewart, she started PA. Per appt desk appt was cancelled d/t no auth and Kara Stewart was pending per [employer].  Called pt, informed her PA is pending and CT can be rescheduled after approval is received.

## 2020-06-10 NOTE — Telephone Encounter (Signed)
Pt called this morning asking why her CT had been cancelled. She said radiology told her to call us because they don't cancel appointments. In epic it has a radiology scheduler  cancelled CT on Friday. Please call patient. [phone]

## 2020-06-11 ENCOUNTER — Ambulatory Visit (HOSPITAL_COMMUNITY): Payer: Medicaid Other

## 2020-06-11 NOTE — Telephone Encounter (Signed)
error 

## 2020-06-12 ENCOUNTER — Other Ambulatory Visit: Payer: Self-pay

## 2020-06-12 ENCOUNTER — Ambulatory Visit (HOSPITAL_COMMUNITY): Payer: Medicaid Other | Admitting: Physical Therapy

## 2020-06-12 DIAGNOSIS — R262 Difficulty in walking, not elsewhere classified: Secondary | ICD-10-CM | POA: Diagnosis not present

## 2020-06-12 DIAGNOSIS — G8929 Other chronic pain: Secondary | ICD-10-CM

## 2020-06-12 DIAGNOSIS — M6281 Muscle weakness (generalized): Secondary | ICD-10-CM

## 2020-06-12 DIAGNOSIS — M545 Low back pain, unspecified: Secondary | ICD-10-CM

## 2020-06-12 NOTE — Patient Instructions (Signed)
Bridge    Lie back, legs bent. Inhale, pressing hips up. Keeping ribs in, lengthen lower back. Exhale, rolling down along spine from top. Repeat _10___ times. Do ___2_ sessions per day.    Hip Abduction / Adduction: with Knee Flexion (Supine)    With right knee bent, gently lower knee to side and return. Repeat _10___ times per set. Do __2__ sessions per day.

## 2020-06-12 NOTE — Therapy (Signed)
[facility], [facility] [address] [place], [place], [address] Phone: [phone]   Fax:  [phone]  Physical Therapy Treatment  Patient Details  Name: Kara Stewart MRN: [id number] Date of Birth: 04-Mar-1976 Referring Provider (PT): Thane Edu, MD   Encounter Date: 06/12/2020   PT End of Session - 06/12/20 0930    Visit Number 3    Number of Visits 12    Date for PT Re-Evaluation 07/01/20    Authorization Type [place] medicaid healthyblue    Authorization Time Period 12 visits approved 2/15-3/28    Authorization - Visit Number 3    Authorization - Number of Visits 12    Progress Note Due on Visit 10    PT Start Time 0926    PT Stop Time 1000    PT Time Calculation (min) 34 min    Activity Tolerance Patient tolerated treatment well    Behavior During Therapy [facility] for tasks assessed/performed           Past Medical History:  Diagnosis Date  . Acid reflux   . 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 Alcoholic hepatitis without ascites   . Anxiety   . Asthma   . Colitis   . GERD (gastroesophageal reflux disease) 07/21/2012  . Hiatal hernia   . History of alcohol abuse 04/26/2015  . Hypertriglyceridemia   . Hypothyroid   . Iritis    frequent  . Ovarian cyst   . Panic attacks   . SVT (supraventricular tachycardia) (HCC)    last issue 12/2017    Past Surgical History:  Procedure Laterality Date  . BIOPSY N/A 02/05/2015   Procedure: BIOPSY;  Surgeon: West Bali, MD;  Location: [facility];  Service: Endoscopy;  Laterality: N/A;  . COLONOSCOPY    . COLONOSCOPY WITH PROPOFOL N/A 02/05/2015   ONG:EXBMW HH/mild diverticulosis  . ESOPHAGEAL DILATION N/A 11/21/2015   Procedure: ESOPHAGEAL DILATION;  Surgeon: Corbin Ade, MD;  [facility];  Service: Endoscopy;  Laterality: N/A;  . ESOPHAGOGASTRODUODENOSCOPY  11/06/2011   [name]: MILD Esophagitis/PATENT ESOPHAGEAL Stricture/  Moderate gastritis. Bx no.hpylori or celiac, +gastritis  .  ESOPHAGOGASTRODUODENOSCOPY (EGD) WITH PROPOFOL N/A 03/20/2014   [name]: 1. Mild esophagitis & distal esophagela stricture. 2. small hiatal hernia 3. moderate non-erosive gastritis and mild duodenits  . ESOPHAGOGASTRODUODENOSCOPY (EGD) WITH PROPOFOL N/A 11/21/2015   Dr. Jena Gauss: LA grade B esophagitis, MW tear likely source of hematemesis  . LAPAROSCOPIC TUBAL LIGATION Bilateral 09/14/2018   Procedure: LAPAROSCOPIC TUBAL LIGATION;  Surgeon: Allie Bossier, MD;  Location: [facility];  Service: Gynecology;  Laterality: Bilateral;  . LIVER BIOPSY  02/23/2020   Procedure: OPEN LIVER BIOPSY;  Surgeon: Fritzi Mandes, MD;  Location: [facility] OR;  Service: General;;  . Gaspar Bidding DILATION N/A 03/20/2014   Procedure: SAVORY DILATION;  Surgeon: West Bali, MD;  Location: [facility];  Service: Endoscopy;  Laterality: N/A;  dilated with # 12.8, 14,15,16  . SPLENECTOMY, TOTAL N/A 02/23/2020   Procedure: SPLENECTOMY;  Surgeon: Fritzi Mandes, MD;  Location: [facility] OR;  Service: General;  Laterality: N/A;  . TOOTH EXTRACTION      There were no vitals filed for this visit.   Subjective Assessment - 06/12/20 1014    Subjective Pt was late for appt today.  pt states she feels worse and the pain is constant in her lower back; pain is now in her shoulders as well from the new exercises  [facility] Adult PT Treatment/Exercise - 06/12/20 0001      Lumbar Exercises: Stretches   Standing Extension 10 reps    Other Lumbar Stretch Exercise prone x 5 min followed by prone on elbows x 2 min      Lumbar Exercises: Supine   Ab Set 10 reps;5 seconds    Clam 10 reps    Clam Limitations with core stabilization                    PT Short Term Goals - 05/20/20 1532      PT SHORT TERM GOAL #1   Title Patient will report at least 25% improvement in symptoms for improved quality of life.    Time 3    Period Weeks    Status New    Target Date 06/10/20      PT SHORT  TERM GOAL #2   Title Patient will be independent with HEP in order to improve functional outcomes.    Time 3    Period Weeks    Status New    Target Date 06/10/20      PT SHORT TERM GOAL #3   Title Patient will demonstrate improved ambulation tolerance as evidenced by distance of 225 ft during with pain not exceeding 2/10    Baseline 130 ft with 4/10 pain using cane    Time 3    Period Weeks    Status New    Target Date 06/10/20             PT Long Term Goals - 05/20/20 1534      PT LONG TERM GOAL #1   Title Patient will report centralization of RLE symptoms with no episodes of pain extending beyond right buttock    Baseline pt outlines entire L5-S1 dermatome for pain map    Time 6    Period Weeks    Status New    Target Date 07/01/20      PT LONG TERM GOAL #2   Title Patient will demonstrate proper lifting mechanics 5/5 observations to reduce risk for reinjury    Baseline poor lifting form    Time 6    Period Weeks    Status New    Target Date 07/01/20                 Plan - 06/12/20 1011    Clinical Impression Statement Discussed therapy progression and explained may take 4-6 weeks before feels progress/strengthening.  Continued on with core stability and education.  Increased isometric hold times to 5 seconds and added clams with core stab challenge.  Pt able to complete in good form and control of movement.  Bridge completed with minimal ROM due to discomfort.  Instructed to focus more on the isometric contraction of activity rather than the gross movement.  Cues needed for breathing with isometric holds.  Finished in prone lying/POE with overall improvement at end of session; no increase in pain.    PT Next Visit Plan Continue with prone progession if this centralizes pain.  Progress core stablity and increase functional strenghening/lifting, etc (has a 2y/o child).    PT Home Exercise Plan prone position, seated with lumbar support   3/9:  clams with core  stab, bridge           Patient will benefit from skilled therapeutic intervention in order to improve the following deficits and impairments:     Visit Diagnosis: Chronic midline low back pain,  unspecified whether sciatica present  Difficulty in walking, not elsewhere classified  Muscle weakness (generalized)     Problem List Patient Active Problem List   Diagnosis Date Noted  . Abdominal pain, epigastric 05/29/2020  . Dysphagia 05/29/2020  . Iron deficiency anemia 05/13/2020  . Asthma due to seasonal allergies 04/17/2020  . Need for immunization against influenza 04/17/2020  . Constipation 03/12/2020  . B12 deficiency 03/12/2020  . Folate deficiency 03/12/2020  . Encounter to establish care 03/06/2020  . Deep vein thrombosis of portal vein 03/06/2020  . 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 Macrocytic anemia 03/06/2020  . Closed fracture of wrist 03/06/2020  . Falls 03/06/2020  . Polyarthralgia 03/06/2020  . Splenic laceration 02/23/2020  . Abnormal LFTs 10/10/2018  . 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 Chronic liver disease 11/16/2017  . Alcoholic hepatitis with ascites 10/05/2016  . History of marijuana use 01/20/2015  . Abnormal CT scan, colon 01/08/2015  . Anxiety 11/29/2014  . History of colitis 11/29/2014  . RUQ pain 08/27/2014  . Thyroid nodule 01/11/2014  . SVT (supraventricular tachycardia) (HCC) 09/07/2012  . History of alcohol use 07/21/2012  . GERD (gastroesophageal reflux disease) 07/21/2012  . Fatty liver 10/28/2011   Lurena Nida, PTA/CLT [phone]  Lurena Nida 06/12/2020, 10:14 AM   [facility] [address] [place], [place], [address] Phone: [phone]   Fax:  [phone]  Name: Kara Stewart MRN: [id number] Date of Birth: [birth year]

## 2020-06-17 ENCOUNTER — Ambulatory Visit (HOSPITAL_COMMUNITY): Payer: Medicaid Other

## 2020-06-19 ENCOUNTER — Ambulatory Visit (HOSPITAL_COMMUNITY): Payer: Medicaid Other

## 2020-06-19 ENCOUNTER — Encounter: Payer: Medicaid Other | Admitting: Neurology

## 2020-06-24 ENCOUNTER — Ambulatory Visit (HOSPITAL_COMMUNITY): Payer: Medicaid Other

## 2020-06-24 ENCOUNTER — Encounter (HOSPITAL_COMMUNITY): Payer: Self-pay

## 2020-06-24 ENCOUNTER — Other Ambulatory Visit: Payer: Self-pay

## 2020-06-25 ENCOUNTER — Ambulatory Visit (HOSPITAL_COMMUNITY): Payer: Medicaid Other | Admitting: Physical Therapy

## 2020-06-25 DIAGNOSIS — G8929 Other chronic pain: Secondary | ICD-10-CM | POA: Diagnosis not present

## 2020-06-25 DIAGNOSIS — R262 Difficulty in walking, not elsewhere classified: Secondary | ICD-10-CM | POA: Diagnosis not present

## 2020-06-25 DIAGNOSIS — M545 Low back pain, unspecified: Secondary | ICD-10-CM

## 2020-06-25 DIAGNOSIS — M6281 Muscle weakness (generalized): Secondary | ICD-10-CM | POA: Diagnosis not present

## 2020-06-25 NOTE — Therapy (Signed)
[facility] [address] [place], [place], [address] Phone: [phone]   Fax:  [phone]  Physical Therapy Treatment  Patient Details  Name: Edy Mcbane MRN: [id number] Date of Birth: 1975-05-11 Referring Provider (PT): Thane Edu, MD   Encounter Date: 06/25/2020   PT End of Session - 06/25/20 1459    Visit Number 4    Number of Visits 12    Date for PT Re-Evaluation 07/01/20    Authorization Type [place] medicaid healthyblue    Authorization Time Period 12 visits approved 2/15-3/28    Authorization - Visit Number 4    Authorization - Number of Visits 12    Progress Note Due on Visit 10    PT Start Time 1500    PT Stop Time 1530    PT Time Calculation (min) 30 min    Activity Tolerance Patient tolerated treatment well    Behavior During Therapy [facility] for tasks assessed/performed           Past Medical History:  Diagnosis Date  . Acid reflux   . 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 Alcoholic hepatitis without ascites   . Anxiety   . Asthma   . Colitis   . GERD (gastroesophageal reflux disease) 07/21/2012  . Hiatal hernia   . History of alcohol abuse 04/26/2015  . Hypertriglyceridemia   . Hypothyroid   . Iritis    frequent  . Ovarian cyst   . Panic attacks   . SVT (supraventricular tachycardia) (HCC)    last issue 12/2017    Past Surgical History:  Procedure Laterality Date  . BIOPSY N/A 02/05/2015   Procedure: BIOPSY;  Surgeon: West Bali, MD;  Location: [facility];  Service: Endoscopy;  Laterality: N/A;  . COLONOSCOPY    . COLONOSCOPY WITH PROPOFOL N/A 02/05/2015   GYI:RSWNI HH/mild diverticulosis  . ESOPHAGEAL DILATION N/A 11/21/2015   Procedure: ESOPHAGEAL DILATION;  Surgeon: Corbin Ade, MD;  [facility];  Service: Endoscopy;  Laterality: N/A;  . ESOPHAGOGASTRODUODENOSCOPY  11/06/2011   [name]: MILD Esophagitis/PATENT ESOPHAGEAL Stricture/  Moderate gastritis. Bx no.hpylori or celiac, +gastritis  .  ESOPHAGOGASTRODUODENOSCOPY (EGD) WITH PROPOFOL N/A 03/20/2014   [name]: 1. Mild esophagitis & distal esophagela stricture. 2. small hiatal hernia 3. moderate non-erosive gastritis and mild duodenits  . ESOPHAGOGASTRODUODENOSCOPY (EGD) WITH PROPOFOL N/A 11/21/2015   Dr. Jena Gauss: LA grade B esophagitis, MW tear likely source of hematemesis  . LAPAROSCOPIC TUBAL LIGATION Bilateral 09/14/2018   Procedure: LAPAROSCOPIC TUBAL LIGATION;  Surgeon: Allie Bossier, MD;  Location:  SURGERY CENTER;  Service: Gynecology;  Laterality: Bilateral;  . LIVER BIOPSY  02/23/2020   Procedure: OPEN LIVER BIOPSY;  Surgeon: Fritzi Mandes, MD;  Location: [facility] OR;  Service: General;;  . Gaspar Bidding DILATION N/A 03/20/2014   Procedure: SAVORY DILATION;  Surgeon: West Bali, MD;  Location: [facility];  Service: Endoscopy;  Laterality: N/A;  dilated with # 12.8, 14,15,16  . SPLENECTOMY, TOTAL N/A 02/23/2020   Procedure: SPLENECTOMY;  Surgeon: Fritzi Mandes, MD;  Location[facility] OR;  Service: General;  Laterality: N/A;  . TOOTH EXTRACTION      There were no vitals filed for this visit.   Subjective Assessment - 06/25/20 1543    Subjective PT late for appointment.  States she did not come due to being to sore then she did not have child care.  She feels PT is helping but is still having significant back pain.    Patient  Stated Goals Be able to get around without back and leg pain, walk to the mailbox (approx 200 ft) without pain/stopping    Pain Score 4     Pain Location Back    Pain Orientation Lower    Pain Descriptors / Indicators Aching    Pain Type Chronic pain    Pain Onset More than a month ago    Pain Frequency Intermittent    Aggravating Factors  bending and lifiting    Pain Relieving Factors meds              [facility] PT Assessment - 06/25/20 0001      Assessment   Medical Diagnosis LBP    Referring Provider (PT) Thane Edu, MD    Prior Therapy remote hx      Prior Function   Level of Independence  Requires assistive device for independence    Vocation Requirements pt has 2 yo child      Cognition   Overall Cognitive Status Within Functional Limits for tasks assessed      Observation/Other Assessments   Observations poor posture, poor lifting mechanics observed      Functional Tests   Functional tests Single leg stance;Sit to Stand      Single Leg Stance   Comments Rt ; 15" ; LT 20"      Sit to Stand   Comments 7 in 30 second period of time      ROM / Strength   AROM / PROM / Strength Strength      AROM   Overall AROM Comments difficulty with arising from forward flexed position, uses hands to "walk" her self back to upright position    Lumbar Flexion 40% limited and painful   was 75% limited   Lumbar Extension WNL    Lumbar - Right Side Bend WNL    Lumbar - Left Side Bend painful on right    Lumbar - Right Rotation painful    Lumbar - Left Rotation WNL      Strength   Overall Strength --   recent abdominal surgery but demonstrates obvious weakness as she is unable to arise from supine unless heavy use of BUE implemented   Strength Assessment Site Knee;Hip    Right/Left Hip Left;Right    Right Hip Extension 3-/5    Right Hip ABduction 3/5    Left Hip Extension 3/5    Left Hip ABduction 3/5    Right/Left Knee Right;Left    Right Knee Flexion 4-/5    Right Knee Extension 5/5    Left Knee Flexion 4+/5    Left Knee Extension 5/5      Ambulation/Gait   Ambulation/Gait Yes    Ambulation/Gait Assistance 6: Modified independent (Device/Increase time)    Ambulation Distance (Feet) 286 Feet   was   Assistive device Straight cane    Gait Pattern --   minimal antalgic   Gait velocity decreased    Gait Comments                         [facility] Adult PT Treatment/Exercise - 06/25/20 0001      Exercises   Exercises Lumbar      Lumbar Exercises: Seated   Sit to Stand 10 reps      Lumbar Exercises: Supine   Bridge 10 reps      Lumbar Exercises:  Sidelying   Hip Abduction 10 reps      Lumbar  Exercises: Prone   Straight Leg Raise 10 reps                    PT Short Term Goals - 06/25/20 1528      PT SHORT TERM GOAL #1   Title Patient will report at least 25% improvement in symptoms for improved quality of life.    Time 3    Period Weeks    Status Achieved    Target Date 06/10/20      PT SHORT TERM GOAL #2   Title Patient will be independent with HEP in order to improve functional outcomes.    Time 3    Period Weeks    Status Achieved    Target Date 06/10/20      PT SHORT TERM GOAL #3   Title Patient will demonstrate improved ambulation tolerance as evidenced by distance of 225 ft during with pain not exceeding 2/10    Baseline 130 ft with 4/10 pain using cane    Time 3    Period Weeks    Status Achieved    Target Date 06/10/20             PT Long Term Goals - 06/25/20 1529      PT LONG TERM GOAL #1   Title Patient will report centralization of RLE symptoms with no episodes of pain extending beyond right buttock    Baseline pt outlines entire L5-S1 dermatome for pain map    Time 6    Period Weeks    Status On-going      PT LONG TERM GOAL #2   Title Patient will demonstrate proper lifting mechanics 5/5 observations to reduce risk for reinjury    Baseline poor lifting form    Time 6    Period Weeks    Status On-going                 Plan - 06/25/20 1530    Clinical Impression Statement PT has not been seen since 06/12/2020 due to illness and child care issue.  PT reassessed with noted improvement in mobility as well as ROM.   PT able to be mm tested today and demonstrates weak hip mm.  PT will continue to benefit from skilled PT to improve her balance to be able to ambulate without a cane as well as increasing hip mm to decrease her pain.    Personal Factors and Comorbidities Comorbidity 1;Time since onset of injury/illness/exacerbation    Comorbidities PMH    Examination-Activity  Limitations Bend;Carry;Squat;Stairs;Lift;Locomotion Level;Transfers    Examination-Participation Restrictions Cleaning;Community Activity;Meal Prep;Laundry    Stability/Clinical Decision Making Stable/Uncomplicated    Rehab Potential Good    PT Frequency 2x / week    PT Duration 6 weeks   4 more weeks past initial end date of 3/38   PT Treatment/Interventions ADLs/Self Care Home Management;Aquatic Therapy;Cryotherapy;Electrical Stimulation;DME Instruction;Traction;Moist Heat;Gait training;Stair training;Functional mobility training;Therapeutic activities;Therapeutic exercise;Balance training;Patient/family education;Neuromuscular re-education;Manual techniques;Passive range of motion;Taping;Energy conservation;Dry needling;Spinal Manipulations;Joint Manipulations    PT Next Visit Plan Continue with prone progession if this centralizes pain.  Begin balance and hip strengthening working on standing activity to allow pt to be confident walking without her cane.    PT Home Exercise Plan prone position, seated with lumbar support; 06/25/2020 :  sit to stand, bridge and prone extension.    Consulted and Agree with Plan of Care Patient           Patient will benefit from skilled  therapeutic intervention in order to improve the following deficits and impairments:  Abnormal gait,Decreased activity tolerance,Decreased balance,Decreased mobility,Decreased endurance,Decreased strength,Difficulty walking,Impaired perceived functional ability,Postural dysfunction,Improper body mechanics,Pain  Visit Diagnosis: Chronic midline low back pain, unspecified whether sciatica present  Difficulty in walking, not elsewhere classified  Muscle weakness (generalized)     Problem List Patient Active Problem List   Diagnosis Date Noted  . Abdominal pain, epigastric 05/29/2020  . Dysphagia 05/29/2020  . Iron deficiency anemia 05/13/2020  . Asthma due to seasonal allergies 04/17/2020  . Need for immunization  against influenza 04/17/2020  . Constipation 03/12/2020  . B12 deficiency 03/12/2020  . Folate deficiency 03/12/2020  . Encounter to establish care 03/06/2020  . Deep vein thrombosis of portal vein 03/06/2020  . 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 Macrocytic anemia 03/06/2020  . Closed fracture of wrist 03/06/2020  . Falls 03/06/2020  . Polyarthralgia 03/06/2020  . Splenic laceration 02/23/2020  . Abnormal LFTs 10/10/2018  . 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 Chronic liver disease 11/16/2017  . Alcoholic hepatitis with ascites 10/05/2016  . History of marijuana use 01/20/2015  . Abnormal CT scan, colon 01/08/2015  . Anxiety 11/29/2014  . History of colitis 11/29/2014  . RUQ pain 08/27/2014  . Thyroid nodule 01/11/2014  . SVT (supraventricular tachycardia) (HCC) 09/07/2012  . History of alcohol use 07/21/2012  . GERD (gastroesophageal reflux disease) 07/21/2012  . Fatty liver 10/28/2011  Virgina Organynthia Russell, PT CLT [phone] 06/25/2020, 4:01 PM  [facility] [address] [place], [place], [address] Phone: [phone]   Fax:  [phone]  Name: Gary FleetStephanie Icenhour MRN: [id number] Date of Birth: January 09, 1976

## 2020-06-26 ENCOUNTER — Ambulatory Visit (HOSPITAL_COMMUNITY): Payer: Medicaid Other | Admitting: Physical Therapy

## 2020-06-26 ENCOUNTER — Other Ambulatory Visit: Payer: Self-pay

## 2020-06-26 DIAGNOSIS — G8929 Other chronic pain: Secondary | ICD-10-CM | POA: Diagnosis not present

## 2020-06-26 DIAGNOSIS — R262 Difficulty in walking, not elsewhere classified: Secondary | ICD-10-CM

## 2020-06-26 DIAGNOSIS — M545 Low back pain, unspecified: Secondary | ICD-10-CM

## 2020-06-26 DIAGNOSIS — M6281 Muscle weakness (generalized): Secondary | ICD-10-CM | POA: Diagnosis not present

## 2020-06-26 NOTE — Therapy (Signed)
[facility] [address] [place], [place], [address] Phone: [phone]   Fax:  [phone]  Physical Therapy Treatment  Patient Details  Name: Kara Stewart MRN: [id number] Date of Birth: 1976-01-07 Referring Provider (PT): Thane Edu, MD   Encounter Date: 06/26/2020   PT End of Session - 06/26/20 1318    Visit Number 5    Number of Visits 12    Date for PT Re-Evaluation 07/01/20    Authorization Type  medicaid healthyblue    Authorization Time Period 12 visits approved 2/15-3/28    Authorization - Visit Number 5    Authorization - Number of Visits 12    Progress Note Due on Visit 10    PT Start Time 1132    PT Stop Time 1215    PT Time Calculation (min) 43 min    Activity Tolerance Patient tolerated treatment well    Behavior During Therapy [facility] for tasks assessed/performed           Past Medical History:  Diagnosis Date  . Acid reflux   . 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 Alcoholic hepatitis without ascites   . Anxiety   . Asthma   . Colitis   . GERD (gastroesophageal reflux disease) 07/21/2012  . Hiatal hernia   . History of alcohol abuse 04/26/2015  . Hypertriglyceridemia   . Hypothyroid   . Iritis    frequent  . Ovarian cyst   . Panic attacks   . SVT (supraventricular tachycardia) (HCC)    last issue 12/2017    Past Surgical History:  Procedure Laterality Date  . BIOPSY N/A 02/05/2015   Procedure: BIOPSY;  Surgeon: West Bali, MD;  Location: [facility];  Service: Endoscopy;  Laterality: N/A;  . COLONOSCOPY    . COLONOSCOPY WITH PROPOFOL N/A 02/05/2015   [id number] HH/mild diverticulosis  . ESOPHAGEAL DILATION N/A 11/21/2015   Procedure: ESOPHAGEAL DILATION;  Surgeon: Corbin Ade, MD;  [facility];  Service: Endoscopy;  Laterality: N/A;  . ESOPHAGOGASTRODUODENOSCOPY  11/06/2011   [name]: MILD Esophagitis/PATENT ESOPHAGEAL Stricture/  Moderate gastritis. Bx no.hpylori or celiac, +gastritis  .  ESOPHAGOGASTRODUODENOSCOPY (EGD) WITH PROPOFOL N/A 03/20/2014   [name]: 1. Mild esophagitis & distal esophagela stricture. 2. small hiatal hernia 3. moderate non-erosive gastritis and mild duodenits  . ESOPHAGOGASTRODUODENOSCOPY (EGD) WITH PROPOFOL N/A 11/21/2015   Dr. Jena Gauss: LA grade B esophagitis, MW tear likely source of hematemesis  . LAPAROSCOPIC TUBAL LIGATION Bilateral 09/14/2018   Procedure: LAPAROSCOPIC TUBAL LIGATION;  Surgeon: Allie Bossier, MD;  Location: [facility];  Service: Gynecology;  Laterality: Bilateral;  . LIVER BIOPSY  02/23/2020   Procedure: OPEN LIVER BIOPSY;  Surgeon: Fritzi Mandes, MD;  Location: [facility] OR;  Service: General;;  . Gaspar Bidding DILATION N/A 03/20/2014   Procedure: SAVORY DILATION;  Surgeon: West Bali, MD;  Location: [facility];  Service: Endoscopy;  Laterality: N/A;  dilated with # 12.8, 14,15,16  . SPLENECTOMY, TOTAL N/A 02/23/2020   Procedure: SPLENECTOMY;  Surgeon: Fritzi Mandes, MD;  Location[facility] OR;  Service: General;  Laterality: N/A;  . TOOTH EXTRACTION      There were no vitals filed for this visit.   Subjective Assessment - 06/26/20 1140    Subjective pt states she's really sore from completing the recertification.  States the exercises given to her yesterday were also difficult.  5/10 pain today after taking a mm relaxer this morning.    Currently in Pain? Yes  Pain Score 5     Pain Location Back    Pain Orientation Lower;Mid    Pain Descriptors / Indicators Aching                             [facility] Adult PT Treatment/Exercise - 06/26/20 0001      Ambulation/Gait   Ambulation/Gait Yes    Ambulation/Gait Assistance 6: Modified independent (Device/Increase time)    Ambulation Distance (Feet) 300 Feet    Assistive device None      Lumbar Exercises: Stretches   Standing Extension 10 reps      Lumbar Exercises: Standing   Heel Raises 15 reps    Forward Lunge 15 reps    Forward Lunge Limitations onto 4" step  with 1HH    Shoulder Adduction Limitations hip abduction and extension 3X each on Rt, 10X on Lt; marching alternating 10X each    Other Standing Lumbar Exercises vectors 10X3" with 1 HHA      Lumbar Exercises: Seated   Sit to Stand 10 reps      Lumbar Exercises: Sidelying   Hip Abduction 10 reps    Hip Abduction Weights (lbs) 2 sets      Lumbar Exercises: Prone   Straight Leg Raise 10 reps    Straight Leg Raises Limitations 2 sets                    PT Short Term Goals - 06/25/20 1528      PT SHORT TERM GOAL #1   Title Patient will report at least 25% improvement in symptoms for improved quality of life.    Time 3    Period Weeks    Status Achieved    Target Date 06/10/20      PT SHORT TERM GOAL #2   Title Patient will be independent with HEP in order to improve functional outcomes.    Time 3    Period Weeks    Status Achieved    Target Date 06/10/20      PT SHORT TERM GOAL #3   Title Patient will demonstrate improved ambulation tolerance as evidenced by distance of 225 ft during with pain not exceeding 2/10    Baseline 130 ft with 4/10 pain using cane    Time 3    Period Weeks    Status Achieved    Target Date 06/10/20             PT Long Term Goals - 06/25/20 1529      PT LONG TERM GOAL #1   Title Patient will report centralization of RLE symptoms with no episodes of pain extending beyond right buttock    Baseline pt outlines entire L5-S1 dermatome for pain map    Time 6    Period Weeks    Status On-going      PT LONG TERM GOAL #2   Title Patient will demonstrate proper lifting mechanics 5/5 observations to reduce risk for reinjury    Baseline poor lifting form    Time 6    Period Weeks    Status On-going                 Plan - 06/26/20 1319    Clinical Impression Statement Per last re-eval, focus of session on increasing hip strength and stability.  Added standing activities today with cues for posturing and form.  Pt Unable to  complete standing hip  abd/ext with Rt LE due to anterior thigh and groin pain on Lt when attempting.  No difficulties completing Lt LE while standing on Rt LE.  These were completed in prone/sidelying without any difficulties.  Alternating march and Vectors added for both sides without any difficulties or c/o pain in Lt LE with full WBing.    No gait abnormalities or LOB with ambulation without AD as well.  Good cadence and normal gait speed.    Personal Factors and Comorbidities Comorbidity 1;Time since onset of injury/illness/exacerbation    Comorbidities PMH    Examination-Activity Limitations Bend;Carry;Squat;Stairs;Lift;Locomotion Level;Transfers    Examination-Participation Restrictions Cleaning;Community Activity;Meal Prep;Laundry    Stability/Clinical Decision Making Stable/Uncomplicated    Rehab Potential Good    PT Frequency 2x / week    PT Duration 6 weeks   4 more weeks past initial end date of 3/38   PT Treatment/Interventions ADLs/Self Care Home Management;Aquatic Therapy;Cryotherapy;Electrical Stimulation;DME Instruction;Traction;Moist Heat;Gait training;Stair training;Functional mobility training;Therapeutic activities;Therapeutic exercise;Balance training;Patient/family education;Neuromuscular re-education;Manual techniques;Passive range of motion;Taping;Energy conservation;Dry needling;Spinal Manipulations;Joint Manipulations    PT Next Visit Plan Continue with prone progession if this centralizes pain.  progress balance and hip strengthening.  Transiton to full time gait without AD    PT Home Exercise Plan prone position, seated with lumbar support; 06/25/2020 :  sit to stand, bridge and prone extension.    Consulted and Agree with Plan of Care Patient           Patient will benefit from skilled therapeutic intervention in order to improve the following deficits and impairments:  Abnormal gait,Decreased activity tolerance,Decreased balance,Decreased mobility,Decreased  endurance,Decreased strength,Difficulty walking,Impaired perceived functional ability,Postural dysfunction,Improper body mechanics,Pain  Visit Diagnosis: Difficulty in walking, not elsewhere classified  Chronic midline low back pain, unspecified whether sciatica present  Muscle weakness (generalized)     Problem List Patient Active Problem List   Diagnosis Date Noted  . Abdominal pain, epigastric 05/29/2020  . Dysphagia 05/29/2020  . Iron deficiency anemia 05/13/2020  . Asthma due to seasonal allergies 04/17/2020  . Need for immunization against influenza 04/17/2020  . Constipation 03/12/2020  . B12 deficiency 03/12/2020  . Folate deficiency 03/12/2020  . Encounter to establish care 03/06/2020  . Deep vein thrombosis of portal vein 03/06/2020  . 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 Macrocytic anemia 03/06/2020  . Closed fracture of wrist 03/06/2020  . Falls 03/06/2020  . Polyarthralgia 03/06/2020  . Splenic laceration 02/23/2020  . Abnormal LFTs 10/10/2018  . 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 Chronic liver disease 11/16/2017  . Alcoholic hepatitis with ascites 10/05/2016  . History of marijuana use 01/20/2015  . Abnormal CT scan, colon 01/08/2015  . Anxiety 11/29/2014  . History of colitis 11/29/2014  . RUQ pain 08/27/2014  . Thyroid nodule 01/11/2014  . SVT (supraventricular tachycardia) (HCC) 09/07/2012  . History of alcohol use 07/21/2012  . GERD (gastroesophageal reflux disease) 07/21/2012  . Fatty liver 10/28/2011   Lurena Nida, PTA/CLT [phone]  Lurena Nida 06/26/2020, 1:31 PM  [facility] [address] [place], [place], [address] Phone: [phone]   Fax:  [phone]  Name: Kara Stewart MRN: [id number] Date of Birth: 1976/01/12

## 2020-07-01 ENCOUNTER — Ambulatory Visit: Payer: Medicaid Other | Admitting: Gastroenterology

## 2020-07-01 ENCOUNTER — Ambulatory Visit (HOSPITAL_COMMUNITY): Payer: Medicaid Other

## 2020-07-01 ENCOUNTER — Other Ambulatory Visit: Payer: Self-pay

## 2020-07-01 ENCOUNTER — Encounter (HOSPITAL_COMMUNITY): Payer: Self-pay

## 2020-07-01 DIAGNOSIS — M545 Low back pain, unspecified: Secondary | ICD-10-CM | POA: Diagnosis not present

## 2020-07-01 DIAGNOSIS — R262 Difficulty in walking, not elsewhere classified: Secondary | ICD-10-CM

## 2020-07-01 DIAGNOSIS — G8929 Other chronic pain: Secondary | ICD-10-CM | POA: Diagnosis not present

## 2020-07-01 DIAGNOSIS — M6281 Muscle weakness (generalized): Secondary | ICD-10-CM | POA: Diagnosis not present

## 2020-07-01 NOTE — Patient Instructions (Signed)
HIP: Flexors - Supine    Lie on edge of surface. Place leg off the surface, allow knee to bend. Bring other knee toward chest. Hold _30 seconds. _3 reps per set, _1 sets per day. Rest lowered foot on stool.  Copyright  VHI. All rights reserved.   Straight Leg Raise (Prone)    Abdomen and head supported, keep left knee locked and raise leg at hip. Avoid arching low back. Repeat 10__ times per set. Do 1-2_ sets per session. Do 1__ sessions per day.  ALSO DO THIS ONE WITH YOUR KNEE BENT TO ACTIVIATE THE REAR/GLUT.  http://orth.exer.us/1113   Copyright  VHI. All rights reserved.

## 2020-07-01 NOTE — Therapy (Signed)
[facility] [address] [place], [place], [address] Phone: [phone]   Fax:  [phone]  Physical Therapy Treatment  Patient Details  Name: Kara Stewart MRN: [id number] Date of Birth: May 22, 1975 Referring Provider (PT): Thane Edu, MD   Encounter Date: 07/01/2020   PT End of Session - 07/01/20 1022    Visit Number 6    Number of Visits 12    Date for PT Re-Evaluation 07/01/20    Authorization Type [place] medicaid healthyblue    Authorization Time Period 12 visits approved 2/15-3/28    Authorization - Visit Number 6    Authorization - Number of Visits 12    Progress Note Due on Visit 10    PT Start Time 1025    PT Stop Time 1104    PT Time Calculation (min) 39 min    Activity Tolerance Patient tolerated treatment well    Behavior During Therapy [facility] for tasks assessed/performed           Past Medical History:  Diagnosis Date  . Acid reflux   . 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 Alcoholic hepatitis without ascites   . Anxiety   . Asthma   . Colitis   . GERD (gastroesophageal reflux disease) 07/21/2012  . Hiatal hernia   . History of alcohol abuse 04/26/2015  . Hypertriglyceridemia   . Hypothyroid   . Iritis    frequent  . Ovarian cyst   . Panic attacks   . SVT (supraventricular tachycardia) (HCC)    last issue 12/2017    Past Surgical History:  Procedure Laterality Date  . BIOPSY N/A 02/05/2015   Procedure: BIOPSY;  Surgeon: West Bali, MD;  Location: [facility];  Service: Endoscopy;  Laterality: N/A;  . COLONOSCOPY    . COLONOSCOPY WITH PROPOFOL N/A 02/05/2015   YBW:[id number] HH/mild diverticulosis  . ESOPHAGEAL DILATION N/A 11/21/2015   Procedure: ESOPHAGEAL DILATION;  Surgeon: Corbin Ade, MD;  [facility];  Service: Endoscopy;  Laterality: N/A;  . ESOPHAGOGASTRODUODENOSCOPY  11/06/2011   [name]: MILD Esophagitis/PATENT ESOPHAGEAL Stricture/  Moderate gastritis. Bx no.hpylori or celiac, +gastritis  .  ESOPHAGOGASTRODUODENOSCOPY (EGD) WITH PROPOFOL N/A 03/20/2014   [name]: 1. Mild esophagitis & distal esophagela stricture. 2. small hiatal hernia 3. moderate non-erosive gastritis and mild duodenits  . ESOPHAGOGASTRODUODENOSCOPY (EGD) WITH PROPOFOL N/A 11/21/2015   Dr. Jena Gauss: LA grade B esophagitis, MW tear likely source of hematemesis  . LAPAROSCOPIC TUBAL LIGATION Bilateral 09/14/2018   Procedure: LAPAROSCOPIC TUBAL LIGATION;  Surgeon: Allie Bossier, MD;  Location: [facility];  Service: Gynecology;  Laterality: Bilateral;  . LIVER BIOPSY  02/23/2020   Procedure: OPEN LIVER BIOPSY;  Surgeon: Fritzi Mandes, MD;  Location: [facility] OR;  Service: General;;  . Gaspar Bidding DILATION N/A 03/20/2014   Procedure: SAVORY DILATION;  Surgeon: West Bali, MD;  Location: [facility];  Service: Endoscopy;  Laterality: N/A;  dilated with # 12.8, 14,15,16  . SPLENECTOMY, TOTAL N/A 02/23/2020   Procedure: SPLENECTOMY;  Surgeon: Fritzi Mandes, MD;  Location: [facility] OR;  Service: General;  Laterality: N/A;  . TOOTH EXTRACTION      There were no vitals filed for this visit.   Subjective Assessment - 07/01/20 1022    Subjective Feels like she is having bone on bone noises in her mid and lower back; when walking into clinic with [facility] and felt a pop and instability in her right hip; she usually feels that in left hip. Hasn't  had pain down left leg in 2 weeks. Does have numbness left foot constant with foot drop. Going to get a neuropathy test next week, not sure if for left forearm/wrist or legs.    Currently in Pain? Yes    Pain Score 5     Pain Location Back            [facility] Adult PT Treatment/Exercise - 07/01/20 0001      Ambulation/Gait   Ambulation/Gait Yes    Ambulation/Gait Assistance 6: Modified independent (Device/Increase time)    Ambulation Distance (Feet) 125 Feet    Assistive device Straight cane    Gait velocity decreased      Self-Care   Self-Care Posture    Posture anatomy, cause of trigger  points, McKenzie centralization progression and expectations signs/symptoms      Exercises   Exercises Lumbar      Lumbar Exercises: Stretches   Single Knee to Chest Stretch Right;Left;1 rep;30 seconds    Hip Flexor Stretch Right;Left;1 rep;30 seconds      Lumbar Exercises: Prone   Straight Leg Raise 10 reps   and with knee bent bilateral   Other Prone Lumbar Exercises heel squeeze x3   c/o hamstring cramp          PT Education - 07/01/20 1056    Education Details Discussed purpose and technique of interventions throughout session. Advanced HEP.    Person(s) Educated Patient    Methods Explanation;Handout    Comprehension Verbalized understanding            PT Short Term Goals - 06/25/20 1528      PT SHORT TERM GOAL #1   Title Patient will report at least 25% improvement in symptoms for improved quality of life.    Time 3    Period Weeks    Status Achieved    Target Date 06/10/20      PT SHORT TERM GOAL #2   Title Patient will be independent with HEP in order to improve functional outcomes.    Time 3    Period Weeks    Status Achieved    Target Date 06/10/20      PT SHORT TERM GOAL #3   Title Patient will demonstrate improved ambulation tolerance as evidenced by distance of 225 ft during with pain not exceeding 2/10    Baseline 130 ft with 4/10 pain using cane    Time 3    Period Weeks    Status Achieved    Target Date 06/10/20             PT Long Term Goals - 06/25/20 1529      PT LONG TERM GOAL #1   Title Patient will report centralization of RLE symptoms with no episodes of pain extending beyond right buttock    Baseline pt outlines entire L5-S1 dermatome for pain map    Time 6    Period Weeks    Status On-going      PT LONG TERM GOAL #2   Title Patient will demonstrate proper lifting mechanics 5/5 observations to reduce risk for reinjury    Baseline poor lifting form    Time 6    Period Weeks    Status On-going            Plan -  07/01/20 1023    Clinical Impression Statement Patient had multiple physical concerns at beginning of treatment session. Education on anatomy, cause of trigger points, McKenzie centralization progression and  expectations signs/symptoms eased patient's concerns and patient ready to move forward with therapeutic exercises. Added supine single knee to chest and hip flexor stretches. Added prone hip extension with knee bend and prone SLR for glut activation. Right gluteal much more difficult for patient to activate during strengthening exercises. Attempted prone heel squeeze with complaints of hamstring cramp.  Advanced HEP to include single knee to chest and hip flexor stretches in supine and prone SLR and prone hip extension with knee bent. Patient reported feeling a bit looser and more able to move at end of treatment session.    Personal Factors and Comorbidities Comorbidity 1;Time since onset of injury/illness/exacerbation    Comorbidities PMH    Examination-Activity Limitations Bend;Carry;Squat;Stairs;Lift;Locomotion Level;Transfers    Examination-Participation Restrictions Cleaning;Community Activity;Meal Prep;Laundry    Stability/Clinical Decision Making Stable/Uncomplicated    Rehab Potential Good    PT Frequency 2x / week    PT Duration 6 weeks   4 more weeks past initial end date of 3/38   PT Treatment/Interventions ADLs/Self Care Home Management;Aquatic Therapy;Cryotherapy;Electrical Stimulation;DME Instruction;Traction;Moist Heat;Gait training;Stair training;Functional mobility training;Therapeutic activities;Therapeutic exercise;Balance training;Patient/family education;Neuromuscular re-education;Manual techniques;Passive range of motion;Taping;Energy conservation;Dry needling;Spinal Manipulations;Joint Manipulations    PT Next Visit Plan Continue with prone progession if this centralizes pain.  progress balance and hip strengthening.  Transiton to full time gait without AD    PT Home Exercise  Plan prone position, seated with lumbar support; 06/25/2020 :  sit to stand, bridge and prone extension; 07/01/20 - supine SKC, hip flexor stretch, prone SLR, prone hip ext with knee bent    Consulted and Agree with Plan of Care Patient           Patient will benefit from skilled therapeutic intervention in order to improve the following deficits and impairments:  Abnormal gait,Decreased activity tolerance,Decreased balance,Decreased mobility,Decreased endurance,Decreased strength,Difficulty walking,Impaired perceived functional ability,Postural dysfunction,Improper body mechanics,Pain  Visit Diagnosis: Difficulty in walking, not elsewhere classified  Chronic midline low back pain, unspecified whether sciatica present  Muscle weakness (generalized)     Problem List Patient Active Problem List   Diagnosis Date Noted  . Abdominal pain, epigastric 05/29/2020  . Dysphagia 05/29/2020  . Iron deficiency anemia 05/13/2020  . Asthma due to seasonal allergies 04/17/2020  . Need for immunization against influenza 04/17/2020  . Constipation 03/12/2020  . B12 deficiency 03/12/2020  . Folate deficiency 03/12/2020  . Encounter to establish care 03/06/2020  . Deep vein thrombosis of portal vein 03/06/2020  . 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 Macrocytic anemia 03/06/2020  . Closed fracture of wrist 03/06/2020  . Falls 03/06/2020  . Polyarthralgia 03/06/2020  . Splenic laceration 02/23/2020  . Abnormal LFTs 10/10/2018  . 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 Chronic liver disease 11/16/2017  . Alcoholic hepatitis with ascites 10/05/2016  . History of marijuana use 01/20/2015  . Abnormal CT scan, colon 01/08/2015  . Anxiety 11/29/2014  . History of colitis 11/29/2014  . RUQ pain 08/27/2014  . Thyroid nodule 01/11/2014  . SVT (supraventricular tachycardia) (HCC) 09/07/2012  . History of alcohol use 07/21/2012  . GERD (gastroesophageal reflux disease) 07/21/2012  . Fatty liver  10/28/2011   Katina Dung. Hartnett-Rands, MS, PT [facility]  [phone]  Epifanio Lesches 07/01/2020, 12:14 PM  [facility] [address] [place], [place], [address] Phone: [phone]   Fax:  [phone]  Name: Tyara Dassow MRN: [id number] Date of Birth: [birth year]

## 2020-07-03 ENCOUNTER — Ambulatory Visit (HOSPITAL_COMMUNITY): Payer: Medicaid Other

## 2020-07-03 ENCOUNTER — Other Ambulatory Visit: Payer: Self-pay

## 2020-07-03 DIAGNOSIS — M545 Low back pain, unspecified: Secondary | ICD-10-CM

## 2020-07-03 DIAGNOSIS — M6281 Muscle weakness (generalized): Secondary | ICD-10-CM

## 2020-07-03 DIAGNOSIS — R262 Difficulty in walking, not elsewhere classified: Secondary | ICD-10-CM | POA: Diagnosis not present

## 2020-07-03 DIAGNOSIS — G8929 Other chronic pain: Secondary | ICD-10-CM

## 2020-07-03 NOTE — Therapy (Signed)
[facility] [address] [place], [place], [address] Phone: [phone]   Fax:  [phone]  Physical Therapy Treatment  Patient Details  Name: Kara Stewart MRN: [id number] Date of Birth: 27-May-1975 Referring Provider (PT): Thane Edu, MD   Encounter Date: 07/03/2020   PT End of Session - 07/03/20 1135    Visit Number 7    Number of Visits 12    Date for PT Re-Evaluation 07/01/20    Authorization Type [place] [employer]    Authorization Time Period 12 visits approved 2/15-3/28    Authorization - Visit Number 7    Authorization - Number of Visits 12    Progress Note Due on Visit 10    PT Start Time 1040    PT Stop Time 1115    PT Time Calculation (min) 35 min    Activity Tolerance Patient tolerated treatment well    Behavior During Therapy [facility] for tasks assessed/performed           Past Medical History:  Diagnosis Date  . Acid reflux   . 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 Alcoholic hepatitis without ascites   . Anxiety   . Asthma   . Colitis   . GERD (gastroesophageal reflux disease) 07/21/2012  . Hiatal hernia   . History of alcohol abuse 04/26/2015  . Hypertriglyceridemia   . Hypothyroid   . Iritis    frequent  . Ovarian cyst   . Panic attacks   . SVT (supraventricular tachycardia) (HCC)    last issue 12/2017    Past Surgical History:  Procedure Laterality Date  . BIOPSY N/A 02/05/2015   Procedure: BIOPSY;  Surgeon: West Bali, MD;  Location: [facility];  Service: Endoscopy;  Laterality: N/A;  . COLONOSCOPY    . COLONOSCOPY WITH PROPOFOL N/A 02/05/2015   DDU:KGURK HH/mild diverticulosis  . ESOPHAGEAL DILATION N/A 11/21/2015   Procedure: ESOPHAGEAL DILATION;  Surgeon: Corbin Ade, MD;  [facility];  Service: Endoscopy;  Laterality: N/A;  . ESOPHAGOGASTRODUODENOSCOPY  11/06/2011   [name]: MILD Esophagitis/PATENT ESOPHAGEAL Stricture/  Moderate gastritis. Bx no.hpylori or celiac, +gastritis  .  ESOPHAGOGASTRODUODENOSCOPY (EGD) WITH PROPOFOL N/A 03/20/2014   [name]: 1. Mild esophagitis & distal esophagela stricture. 2. small hiatal hernia 3. moderate non-erosive gastritis and mild duodenits  . ESOPHAGOGASTRODUODENOSCOPY (EGD) WITH PROPOFOL N/A 11/21/2015   Dr. Jena Gauss: LA grade B esophagitis, MW tear likely source of hematemesis  . LAPAROSCOPIC TUBAL LIGATION Bilateral 09/14/2018   Procedure: LAPAROSCOPIC TUBAL LIGATION;  Surgeon: Allie Bossier, MD;  Location: [facility];  Service: Gynecology;  Laterality: Bilateral;  . LIVER BIOPSY  02/23/2020   Procedure: OPEN LIVER BIOPSY;  Surgeon: Fritzi Mandes, MD;  Location: [facility] OR;  Service: General;;  . Gaspar Bidding DILATION N/A 03/20/2014   Procedure: SAVORY DILATION;  Surgeon: West Bali, MD;  Location: [facility];  Service: Endoscopy;  Laterality: N/A;  dilated with # 12.8, 14,15,16  . SPLENECTOMY, TOTAL N/A 02/23/2020   Procedure: SPLENECTOMY;  Surgeon: Fritzi Mandes, MD;  Location: [facility] OR;  Service: General;  Laterality: N/A;  . TOOTH EXTRACTION      There were no vitals filed for this visit.   Subjective Assessment - 07/03/20 1046    Subjective Pt reports increase in right inguinal pain which she reports radiates from her back and feels like it has occurred after her right hip popped and feels groind and right low back pain. Pt reports improvement in her left  LE numbess and tingling and claims shoting pains have abated but continues to have a baseline of numbess and tingling along her LLE from her buttocks to sole of left foot. Notes she has also had numbess and tingling in her right foot as well.                             [facility] Adult PT Treatment/Exercise - 07/03/20 0001      Lumbar Exercises: Prone   Other Prone Lumbar Exercises prone position x 10 min and on elbows      Manual Therapy   Manual Therapy Soft tissue mobilization    Manual therapy comments completed seperate from other intervnetions    Soft  tissue mobilization mobilization and petrissage to L-spine to alleviate muslce spasm in QL and erectors                  PT Education - 07/03/20 1138    Education Details pt education in assessment findings and activity modification. Discussion regarding her multiple symptoms and advised to seek further advice from MD    Person(s) Educated Patient    Methods Explanation    Comprehension Verbalized understanding            PT Short Term Goals - 06/25/20 1528      PT SHORT TERM GOAL #1   Title Patient will report at least 25% improvement in symptoms for improved quality of life.    Time 3    Period Weeks    Status Achieved    Target Date 06/10/20      PT SHORT TERM GOAL #2   Title Patient will be independent with HEP in order to improve functional outcomes.    Time 3    Period Weeks    Status Achieved    Target Date 06/10/20      PT SHORT TERM GOAL #3   Title Patient will demonstrate improved ambulation tolerance as evidenced by distance of 225 ft during with pain not exceeding 2/10    Baseline 130 ft with 4/10 pain using cane    Time 3    Period Weeks    Status Achieved    Target Date 06/10/20             PT Long Term Goals - 06/25/20 1529      PT LONG TERM GOAL #1   Title Patient will report centralization of RLE symptoms with no episodes of pain extending beyond right buttock    Baseline pt outlines entire L5-S1 dermatome for pain map    Time 6    Period Weeks    Status On-going      PT LONG TERM GOAL #2   Title Patient will demonstrate proper lifting mechanics 5/5 observations to reduce risk for reinjury    Baseline poor lifting form    Time 6    Period Weeks    Status On-going                 Plan - 07/03/20 1135    Clinical Impression Statement Continues to report LLE symptoms eminating along her buttock to the sole of foot but reports no shooting pains but persistent numbess and tingling noted along LE.  Pt reports new onset of  right groin/inguinal pain which she says shoots from her low back. No pain with resisted hip flexion or adduction but increased pain with prone quad stretch position. Pt  reports some decrease in low back pain after soft tissue mobilization to L-spine    Personal Factors and Comorbidities Comorbidity 1;Time since onset of injury/illness/exacerbation    Comorbidities PMH    Examination-Activity Limitations Bend;Carry;Squat;Stairs;Lift;Locomotion Level;Transfers    Examination-Participation Restrictions Cleaning;Community Activity;Meal Prep;Laundry    Stability/Clinical Decision Making Stable/Uncomplicated    Rehab Potential Good    PT Frequency 2x / week    PT Duration 6 weeks   4 more weeks past initial end date of 3/38   PT Treatment/Interventions ADLs/Self Care Home Management;Aquatic Therapy;Cryotherapy;Electrical Stimulation;DME Instruction;Traction;Moist Heat;Gait training;Stair training;Functional mobility training;Therapeutic activities;Therapeutic exercise;Balance training;Patient/family education;Neuromuscular re-education;Manual techniques;Passive range of motion;Taping;Energy conservation;Dry needling;Spinal Manipulations;Joint Manipulations    PT Next Visit Plan Continue with prone progession if this centralizes pain.  progress balance and hip strengthening.  Transiton to full time gait without AD    PT Home Exercise Plan prone position, seated with lumbar support; 06/25/2020 :  sit to stand, bridge and prone extension; 07/01/20 - supine SKC, hip flexor stretch, prone SLR, prone hip ext with knee bent    Consulted and Agree with Plan of Care Patient           Patient will benefit from skilled therapeutic intervention in order to improve the following deficits and impairments:  Abnormal gait,Decreased activity tolerance,Decreased balance,Decreased mobility,Decreased endurance,Decreased strength,Difficulty walking,Impaired perceived functional ability,Postural dysfunction,Improper body  mechanics,Pain  Visit Diagnosis: Difficulty in walking, not elsewhere classified  Chronic midline low back pain, unspecified whether sciatica present  Muscle weakness (generalized)     Problem List Patient Active Problem List   Diagnosis Date Noted  . Abdominal pain, epigastric 05/29/2020  . Dysphagia 05/29/2020  . Iron deficiency anemia 05/13/2020  . Asthma due to seasonal allergies 04/17/2020  . Need for immunization against influenza 04/17/2020  . Constipation 03/12/2020  . B12 deficiency 03/12/2020  . Folate deficiency 03/12/2020  . Encounter to establish care 03/06/2020  . Deep vein thrombosis of portal vein 03/06/2020  . 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 Macrocytic anemia 03/06/2020  . Closed fracture of wrist 03/06/2020  . Falls 03/06/2020  . Polyarthralgia 03/06/2020  . Splenic laceration 02/23/2020  . Abnormal LFTs 10/10/2018  . 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 Chronic liver disease 11/16/2017  . Alcoholic hepatitis with ascites 10/05/2016  . History of marijuana use 01/20/2015  . Abnormal CT scan, colon 01/08/2015  . Anxiety 11/29/2014  . History of colitis 11/29/2014  . RUQ pain 08/27/2014  . Thyroid nodule 01/11/2014  . SVT (supraventricular tachycardia) (HCC) 09/07/2012  . History of alcohol use 07/21/2012  . GERD (gastroesophageal reflux disease) 07/21/2012  . Fatty liver 10/28/2011   11:41 AM, 07/03/20 M. Shary Decamp, PT, DPT Physical Therapist- [place] Office Number: [phone]  [facility] [address] [place], [place], [address] Phone: [phone]   Fax:  [phone]  Name: Kara Stewart MRN: [id number] Date of Birth: [birth year]

## 2020-07-04 ENCOUNTER — Ambulatory Visit (HOSPITAL_COMMUNITY)
Admission: RE | Admit: 2020-07-04 | Discharge: 2020-07-04 | Disposition: A | Discharge status: Home / Self Care | Payer: Medicaid Other | Source: Ambulatory Visit | Attending: Gastroenterology | Admitting: Gastroenterology

## 2020-07-04 DIAGNOSIS — R1011 Right upper quadrant pain: Secondary | ICD-10-CM

## 2020-07-04 DIAGNOSIS — R1310 Dysphagia, unspecified: Secondary | ICD-10-CM

## 2020-07-04 DIAGNOSIS — Z9081 Acquired absence of spleen: Secondary | ICD-10-CM

## 2020-07-04 DIAGNOSIS — D509 Iron deficiency anemia, unspecified: Secondary | ICD-10-CM | POA: Diagnosis not present

## 2020-07-04 DIAGNOSIS — R1013 Epigastric pain: Secondary | ICD-10-CM

## 2020-07-04 DIAGNOSIS — R109 Unspecified abdominal pain: Secondary | ICD-10-CM | POA: Diagnosis not present

## 2020-07-04 DIAGNOSIS — I81 Portal vein thrombosis: Secondary | ICD-10-CM

## 2020-07-04 DIAGNOSIS — K219 Gastro-esophageal reflux disease without esophagitis: Secondary | ICD-10-CM | POA: Diagnosis not present

## 2020-07-04 DIAGNOSIS — K743 Primary biliary cirrhosis: Secondary | ICD-10-CM | POA: Diagnosis not present

## 2020-07-04 MED ORDER — IOHEXOL 300 MG/ML  SOLN
100.0000 mL | Freq: Once | INTRAMUSCULAR | Status: AC | PRN
  Administered 2020-07-04: 100 mL via INTRAVENOUS

## 2020-07-08 ENCOUNTER — Other Ambulatory Visit: Payer: Self-pay | Admitting: Gastroenterology

## 2020-07-09 NOTE — Telephone Encounter (Signed)
Spoke with the pt and she is still taking the monthly B12 shots.

## 2020-07-09 NOTE — Telephone Encounter (Signed)
Dena, can we verify if she is still taking B12 shots monthly? I can't tell who initially started this.

## 2020-07-10 ENCOUNTER — Other Ambulatory Visit: Payer: Self-pay | Admitting: *Deleted

## 2020-07-10 ENCOUNTER — Telehealth: Payer: Self-pay | Admitting: Internal Medicine

## 2020-07-10 ENCOUNTER — Telehealth: Payer: Self-pay

## 2020-07-10 MED ORDER — APIXABAN 5 MG PO TABS: 5.0000 mg | ORAL_TABLET | Freq: Two times a day (BID) | ORAL | 0 refills | Status: DC

## 2020-07-10 NOTE — Telephone Encounter (Signed)
[phone] please call patient, she is asking about test results and needs leslie to type a letter for her disability

## 2020-07-10 NOTE — Telephone Encounter (Signed)
I contacted patient she said Dr patel would like to keep her on this medication and he would prescribe this medicine for her.  Medicine was given to her when she had her spleen  removed, she does not know who prescribed this medicine to her.

## 2020-07-10 NOTE — Telephone Encounter (Signed)
This was not sent in by our office please advise her to call the office that prescribed this medication

## 2020-07-10 NOTE — Telephone Encounter (Signed)
This medication was sent in by Katragadda last from [facility] do you want her to get them to fill this medication?

## 2020-07-10 NOTE — Telephone Encounter (Signed)
Patient called need med refill apixaban (ELIQUIS) 5 MG TABS tablet [  Pharmacy:[id number] - [place], [place] - [address]  [address], [place] [address]  Phone:  [phone] Fax:  [phone]

## 2020-07-10 NOTE — Telephone Encounter (Signed)
NA NVM to let pt know  

## 2020-07-10 NOTE — Telephone Encounter (Signed)
Please advise her to contact Hematology clinic soon as they tried to contact her - Dr Pamelia Hoit. Okay to provide refills for now.

## 2020-07-10 NOTE — Telephone Encounter (Signed)
Returned pt's call phone just rang. No vm to leave a message. Checked pt's chart and her CT has not been viewed and reported to me yet.

## 2020-07-14 NOTE — Progress Notes (Deleted)
NEUROLOGY CONSULTATION NOTE  Kara Stewart MRN: [id number] DOB: 06-12-1975  Referring provider: Trena Platt, MD Primary care provider: Trena Platt, MD  Reason for consult:  Migraines and frequent falls  Assessment/Plan:   ***   Subjective:  Kara Stewart is a 45 year old ***-handed female with fibromyalgia/polyarthralgia, SVT, primary biliary cirrhosis with history of alcohol abuse with alcoholic hepatitis and history of DVT of portal vein (on Eliquis) who presents for migraines and frequent falls.  History supplemented by ED and rheumatology notes.  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   She reports frequent falls.  ***.  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  She has also sustained left distal radial fracture ***.  She continues to have chronic abdominal pain.  She reports weakness in the legs.  She sees a [facility] for her chronic low back pain and was told she had degenerative changes and pinched nerves in her lumbar spine.  Most recent CT abdomen and plevis on 3/32/2022 demonstrated postsurgical changes of prior splenectomy but overall unremarkable.  CT head without contrast from 01/20/2015 personally reviewed was unremarkable.  Current NSAIDS/analgesics:  Tylenol, tramadol Current triptans:  *** Current ergotamine:  *** Current anti-emetic:  Zofran-ODT 4mg  Current muscle relaxants:  Robaxin Current Antihypertensive medications:  *** Current Antidepressant medications:  *** Current Anticonvulsant medications:  *** Current anti-CGRP:  *** Current Vitamins/Herbal/Supplements:  B12 Current Antihistamines/Decongestants:  *** Other therapy:  *** Hormone/birth control:  *** Other medications:   ***  Past NSAIDS/analgesics:  Excedrin Migraine, ibuprofen, naproxen Past abortive triptans:  *** Past abortive ergotamine:  *** Past muscle relaxants:  Flexeril Past anti-emetic:  Promethazine 25mg  Past antihypertensive medications:  Lopressor, Lasix Past antidepressant medications:  Prozac, trazodone Past anticonvulsant medications:  *** Past anti-CGRP:  *** Past vitamins/Herbal/Supplements:  Folic acid, thiamine Past antihistamines/decongestants:  Benadryl, Zyrtec Other past therapies:  ***  Caffeine:  *** Alcohol:  *** Smoker:  *** Diet:  *** Exercise:  *** Depression:  ***; Anxiety:  *** Other pain:  *** Sleep hygiene:  *** Family history of headache:  ***      PAST MEDICAL HISTORY: Past Medical History:  Diagnosis Date  . Acid reflux   . 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 Alcoholic hepatitis without ascites   . Anxiety   . Asthma   . Colitis   . GERD (gastroesophageal reflux disease) 07/21/2012  . Hiatal hernia   . History of alcohol abuse 04/26/2015  . Hypertriglyceridemia   . Hypothyroid   . Iritis    frequent  . Ovarian cyst   . Panic attacks   . SVT (supraventricular tachycardia) (HCC)    last issue 12/2017    PAST SURGICAL HISTORY: Past Surgical History:  Procedure Laterality Date  . BIOPSY N/A 02/05/2015   Procedure: BIOPSY;  Surgeon: 01/2018, MD;  Location: [facility];  Service: Endoscopy;  Laterality: N/A;  . COLONOSCOPY    . COLONOSCOPY WITH PROPOFOL N/A 02/05/2015   [facility] HH/mild diverticulosis  . ESOPHAGEAL DILATION N/A 11/21/2015   Procedure: ESOPHAGEAL DILATION;  Surgeon: FAO:ZHYQM, MD;  [facility];  Service: Endoscopy;  Laterality: N/A;  . ESOPHAGOGASTRODUODENOSCOPY  11/06/2011   [name]: MILD Esophagitis/PATENT ESOPHAGEAL Stricture/  Moderate gastritis. Bx no.hpylori or celiac, +gastritis  . ESOPHAGOGASTRODUODENOSCOPY (  EGD) WITH PROPOFOL N/A 03/20/2014   [name]: 1. Mild esophagitis & distal esophagela stricture. 2. small  hiatal hernia 3. moderate non-erosive gastritis and mild duodenits  . ESOPHAGOGASTRODUODENOSCOPY (EGD) WITH PROPOFOL N/A 11/21/2015   Dr. Jena Gauss: LA grade B esophagitis, MW tear likely source of hematemesis  . LAPAROSCOPIC TUBAL LIGATION Bilateral 09/14/2018   Procedure: LAPAROSCOPIC TUBAL LIGATION;  Surgeon: Allie Bossier, MD;  Location: [facility];  Service: Gynecology;  Laterality: Bilateral;  . LIVER BIOPSY  02/23/2020   Procedure: OPEN LIVER BIOPSY;  Surgeon: Fritzi Mandes, MD;  Location: [facility] OR;  Service: General;;  . Gaspar Bidding DILATION N/A 03/20/2014   Procedure: SAVORY DILATION;  Surgeon: West Bali, MD;  Location: [facility];  Service: Endoscopy;  Laterality: N/A;  dilated with # 12.8, 14,15,16  . SPLENECTOMY, TOTAL N/A 02/23/2020   Procedure: SPLENECTOMY;  Surgeon: Fritzi Mandes, MD;  Location: [facility] OR;  Service: General;  Laterality: N/A;  . TOOTH EXTRACTION      MEDICATIONS: Current Outpatient Medications on File Prior to Visit  Medication Sig Dispense Refill  . acetaminophen (TYLENOL) 325 MG tablet Take 2 tablets (650 mg total) by mouth every 6 (six) hours as needed.    Marland Kitchen albuterol (VENTOLIN HFA) 108 (90 Base) MCG/ACT inhaler Inhale 2 puffs into the lungs every 6 (six) hours as needed for wheezing or shortness of breath. 8 g 0  . apixaban (ELIQUIS) 5 MG TABS tablet Take 1 tablet (5 mg total) by mouth 2 (two) times daily. 60 tablet 0  . cyanocobalamin (,VITAMIN B-12,) 1000 MCG/ML injection Inject 1 mL (1,000 mcg total) into the muscle every 30 (thirty) days. 3 mL 1  . Cyanocobalamin (VITAMIN B-12 IJ) Inject as directed every 30 (thirty) days.    . methocarbamol (ROBAXIN) 500 MG tablet TAKE 1 TABLET BY MOUTH FOUR TIMES DAILY AS NEEDED 60 tablet 1  . ondansetron (ZOFRAN-ODT) 4 MG disintegrating tablet Take 1 tablet (4 mg total) by mouth every 8 (eight) hours as needed for nausea or vomiting. 20 tablet 0  . traMADol (ULTRAM) 50 MG tablet Take 1 tablet (50 mg total) by mouth  every 6 (six) hours as needed. 30 tablet 1  . ursodiol (ACTIGALL) 500 MG tablet Take 1 tablet (500 mg total) by mouth 2 (two) times daily. 60 tablet 3   No current facility-administered medications on file prior to visit.    ALLERGIES: Allergies  Allergen Reactions  . Penicillins Anaphylaxis    Has patient had a PCN reaction causing immediate rash, facial/tongue/throat swelling, SOB or lightheadedness with hypotension: yes Has patient had a PCN reaction causing severe rash involving mucus membranes or skin necrosis: No Has patient had a PCN reaction that required hospitalization Yes Has patient had a PCN reaction occurring within the last 10 years: No If all of the above answers are "NO", then may proceed with Cephalosporin use.   . Lidocaine Other (See Comments)    Can use topical lidocaine but if ingested causes Hallucinations.    FAMILY HISTORY: Family History  Problem Relation Age of Onset  . Stomach cancer Paternal Grandfather        colon cancer  . Cancer Paternal Grandfather        throat and esophagus  . Breast cancer Maternal Grandmother   . Cancer Maternal Grandmother        skin  . Anxiety disorder Maternal Grandmother   . Hypertension Mother   . Other Mother  fatty liver  . Hyperlipidemia Mother   . Liver disease Mother        fatty liver, does not drink.   . Sleep apnea Mother   . Other Father        varicose veins; stomach issues; hernia  . Hypertension Father   . Hyperlipidemia Father   . Cancer Father        prostate  . Arthritis Father        rheumatoid  . Neuropathy Father   . Rheum arthritis Father   . Thyroid disease Sister   . Hypertension Sister   . Other Brother        hernia  . Diabetes Maternal Grandfather   . Heart disease Maternal Grandfather   . Other Paternal Grandmother        hernia  . COPD Paternal Grandmother   . Diabetes Paternal Grandmother   . Healthy Daughter     Objective:  *** General: No acute distress.   Patient appears well-groomed.   Head:  Normocephalic/atraumatic Eyes:  fundi examined but not visualized Neck: supple, no paraspinal tenderness, full range of motion Back: No paraspinal tenderness Heart: regular rate and rhythm Lungs: Clear to auscultation bilaterally. Vascular: No carotid bruits. Neurological Exam: Mental status: alert and oriented to person, place, and time, recent and remote memory intact, fund of knowledge intact, attention and concentration intact, speech fluent and not dysarthric, language intact. Cranial nerves: CN I: not tested CN II: pupils equal, round and reactive to light, visual fields intact CN III, IV, VI:  full range of motion, no nystagmus, no ptosis CN V: facial sensation intact. CN VII: upper and lower face symmetric CN VIII: hearing intact CN IX, X: gag intact, uvula midline CN XI: sternocleidomastoid and trapezius muscles intact CN XII: tongue midline Bulk & Tone: normal, no fasciculations. Motor:  muscle strength 5/5 throughout Sensation:  Pinprick, temperature and vibratory sensation intact. Deep Tendon Reflexes:  2+ throughout,  toes downgoing.   Finger to nose testing:  Without dysmetria.   Heel to shin:  Without dysmetria.   Gait:  Normal station and stride.  Romberg negative.    Thank you for allowing me to take part in the care of this patient.  Shon Millet, DO  CC:  Trena Platt, MD

## 2020-07-15 ENCOUNTER — Ambulatory Visit: Payer: Medicaid Other | Admitting: Neurology

## 2020-07-15 ENCOUNTER — Other Ambulatory Visit: Payer: Self-pay
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
## 2020-07-16 ENCOUNTER — Encounter: Payer: Self-pay | Admitting: Neurology

## 2020-07-16 ENCOUNTER — Other Ambulatory Visit: Payer: Self-pay

## 2020-07-16 ENCOUNTER — Ambulatory Visit: Payer: Medicaid Other | Admitting: Neurology

## 2020-07-16 VITALS — BP 117/71 | HR 90 | Resp 20 | Ht 63.5 in | Wt 142.0 lb

## 2020-07-16 DIAGNOSIS — R292 Abnormal reflex: Secondary | ICD-10-CM | POA: Diagnosis not present

## 2020-07-16 DIAGNOSIS — M545 Low back pain, unspecified: Secondary | ICD-10-CM | POA: Diagnosis not present

## 2020-07-16 DIAGNOSIS — R29898 Other symptoms and signs involving the musculoskeletal system: Secondary | ICD-10-CM | POA: Diagnosis not present

## 2020-07-16 DIAGNOSIS — R519 Headache, unspecified: Secondary | ICD-10-CM

## 2020-07-16 DIAGNOSIS — G8929 Other chronic pain: Secondary | ICD-10-CM

## 2020-07-16 DIAGNOSIS — R413 Other amnesia: Secondary | ICD-10-CM | POA: Diagnosis not present

## 2020-07-16 MED ORDER — GABAPENTIN 100 MG PO CAPS: ORAL_CAPSULE | ORAL | 0 refills | Status: DC

## 2020-07-16 NOTE — Patient Instructions (Signed)
1.  Check MRI of brain with and without contrast 2.  Check MRI cervical spine without contrast 3.  Check MRI of lumbar spine without contrast 4.  For headache, start gabapentin 100mg  - take 1 pill at bedtime for a week, then increase to 2 pills at bedtime. 5.  Follow up in 4 to 6 months but further recommendations pending results.

## 2020-07-17 NOTE — Telephone Encounter (Signed)
Taken care of by [employer]

## 2020-07-18 ENCOUNTER — Telehealth: Payer: Self-pay | Admitting: Neurology

## 2020-07-18 ENCOUNTER — Encounter: Payer: Self-pay | Admitting: Neurology

## 2020-07-18 NOTE — Telephone Encounter (Signed)
Patient received a missed call from [place] and is returning the call. We received a referral for her, I am calling her back to schedule.

## 2020-08-05 ENCOUNTER — Ambulatory Visit: Payer: Medicaid Other | Admitting: Neurology

## 2020-08-06 ENCOUNTER — Ambulatory Visit: Payer: Medicaid Other | Admitting: Neurology

## 2020-08-06 ENCOUNTER — Other Ambulatory Visit: Payer: Self-pay

## 2020-08-06 DIAGNOSIS — R202 Paresthesia of skin: Secondary | ICD-10-CM | POA: Diagnosis not present

## 2020-08-06 NOTE — Procedures (Signed)
[facility]  [address] [place], [address]  [place], [place] [address] Tel: [phone] Fax:  [phone] Test Date:  08/06/2020  Patient: Kara Stewart DOB: 05/27/1975 Physician: Nita Sickle, DO  Sex: Female Height: 5\' 3"  Ref Phys:  ID#: [id number]   Technician:    Patient Complaints: This is a 45 year old female referred for evaluation of bilateral hand paresthesias.  NCV & EMG Findings: Extensive electrodiagnostic testing of the right upper extremity and additional studies of the left shows: 1. Bilateral median, ulnar, and mixed palmar sensory responses are within normal limits. 2. Bilateral median and ulnar motor responses are within normal limits. 3. There is no evidence of active or chronic motor axonal loss changes affecting any of the tested muscles.  Motor unit configuration and recruitment pattern is within normal limits.  Impression: This is a normal study of the upper extremities.  In particular, there is no evidence of carpal tunnel syndrome or a cervical radiculopathy.   ___________________________ 59, DO    Nerve Conduction Studies Anti Sensory Summary Table   Stim Site NR Peak (ms) Norm Peak (ms) P-T Amp (V) Norm P-T Amp  Left Median Anti Sensory (2nd Digit)  32C  Wrist    2.8 <3.4 58.2 >20  Right Median Anti Sensory (2nd Digit)  32C  Wrist    3.0 <3.4 53.2 >20  Left Ulnar Anti Sensory (5th Digit)  32C  Wrist    2.7 <3.1 51.8 >12  Right Ulnar Anti Sensory (5th Digit)  32C  Wrist    2.7 <3.1 49.7 >12   Motor Summary Table   Stim Site NR Onset (ms) Norm Onset (ms) O-P Amp (mV) Norm O-P Amp Site1 Site2 Delta-0 (ms) Dist (cm) Vel (m/s) Norm Vel (m/s)  Left Median Motor (Abd Poll Brev)  32C  Wrist    2.7 <3.9 12.3 >6 Elbow Wrist 4.5 25.0 56 >50  Elbow    7.2  12.1         Right Median Motor (Abd Poll Brev)  32C  Wrist    2.7 <3.9 13.5 >6 Elbow Wrist 4.6 27.0 59 >50  Elbow    7.3  13.2         Left Ulnar Motor  (Abd Dig Minimi)  32C  Wrist    2.1 <3.1 14.0 >7 B Elbow Wrist 3.1 20.0 65 >50  B Elbow    5.2  13.7  A Elbow B Elbow 1.7 10.0 59 >50  A Elbow    6.9  13.6         Right Ulnar Motor (Abd Dig Minimi)  32C  Wrist    2.2 <3.1 10.5 >7 B Elbow Wrist 3.4 23.0 68 >50  B Elbow    5.6  9.7  A Elbow B Elbow 1.4 10.0 71 >50  A Elbow    7.0  9.7          Comparison Summary Table   Stim Site NR Peak (ms) Norm Peak (ms) P-T Amp (V) Site1 Site2 Delta-P (ms) Norm Delta (ms)  Left Median/Ulnar Palm Comparison (Wrist - 8cm)  32C  Median Palm    1.6 <2.2 71.3 Median Palm Ulnar Palm 0.1   Ulnar Palm    1.5 <2.2 24.7      Right Median/Ulnar Palm Comparison (Wrist - 8cm)  32C  Median Palm    1.7 <2.2 63.6 Median Palm Ulnar Palm 0.2   Ulnar Palm    1.5 <2.2 35.2  EMG   Side Muscle Ins Act Fibs Psw Fasc Number Recrt Dur Dur. Amp Amp. Poly Poly. Comment  Right 1stDorInt Nml Nml Nml Nml Nml Nml Nml Nml Nml Nml Nml Nml N/A  Right PronatorTeres Nml Nml Nml Nml Nml Nml Nml Nml Nml Nml Nml Nml N/A  Right Biceps Nml Nml Nml Nml Nml Nml Nml Nml Nml Nml Nml Nml N/A  Right Triceps Nml Nml Nml Nml Nml Nml Nml Nml Nml Nml Nml Nml N/A  Right Deltoid Nml Nml Nml Nml Nml Nml Nml Nml Nml Nml Nml Nml N/A  Left 1stDorInt Nml Nml Nml Nml Nml Nml Nml Nml Nml Nml Nml Nml N/A  Left PronatorTeres Nml Nml Nml Nml Nml Nml Nml Nml Nml Nml Nml Nml N/A  Left Biceps Nml Nml Nml Nml Nml Nml Nml Nml Nml Nml Nml Nml N/A  Left Triceps Nml Nml Nml Nml Nml Nml Nml Nml Nml Nml Nml Nml N/A  Left Deltoid Nml Nml Nml Nml Nml Nml Nml Nml Nml Nml Nml Nml N/A      Waveforms:

## 2020-08-07 ENCOUNTER — Ambulatory Visit (INDEPENDENT_AMBULATORY_CARE_PROVIDER_SITE_OTHER): Payer: Medicaid Other | Admitting: Gastroenterology

## 2020-08-07 ENCOUNTER — Encounter: Payer: Self-pay | Admitting: Gastroenterology

## 2020-08-07 VITALS — BP 107/72 | HR 86 | Temp 97.7°F | Ht 64.0 in | Wt 138.2 lb

## 2020-08-07 DIAGNOSIS — K219 Gastro-esophageal reflux disease without esophagitis: Secondary | ICD-10-CM

## 2020-08-07 DIAGNOSIS — R1011 Right upper quadrant pain: Secondary | ICD-10-CM

## 2020-08-07 DIAGNOSIS — R6881 Early satiety: Secondary | ICD-10-CM

## 2020-08-07 DIAGNOSIS — R1310 Dysphagia, unspecified: Secondary | ICD-10-CM

## 2020-08-07 DIAGNOSIS — K743 Primary biliary cirrhosis: Secondary | ICD-10-CM

## 2020-08-07 MED ORDER — PANTOPRAZOLE SODIUM 40 MG PO TBEC: 40.0000 mg | DELAYED_RELEASE_TABLET | Freq: Two times a day (BID) | ORAL | 1 refills | Status: DC

## 2020-08-07 NOTE — Progress Notes (Signed)
Primary Care Physician: Anabel HalonPatel, Rutwik K, MD  Primary Gastroenterologist:  Hennie Duosharles K. Marletta Lorarver, DO   Chief Complaint  Patient presents with  . Dysphagia    Feels like foods gets stuck  . Abdominal Pain    Right abd and mid upper abd    HPI: Kara FleetStephanie Stewart is a 45 y.o. female here for follow-up of chronic liver disease.  Last seen in February.  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  Underwent liver biopsy based on nodular appearing liver at time of surgery.  Biopsy showed mildly active steatohepatitis, alcoholic and/or nonalcoholic, with mild to moderate fibrosis.  Mildly active steatohepatitis (grade 1 of 3).  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 Clinical course complicated by development of thrombus within the main portal vein and central intrahepatic portal vein branches.  Currently on Eliquis, followed by hematology.  Complains of persistent LUQ pain although improved. Complains of early satiety, not really hungry. Water gets stuck in throat area and usually comes back up. No vomiting but feels more like regurgitation. Sips liquids only. Little dysphagia to solid foods like broccoli, and spinach. No heartburn. Occasional indigestion. RUQ chronic in nature, persistent. She is not sure of any foods or activities that make it worse. Notes she started having menstrual bleeding twice per month and associated with heavy bleeding, clots. Has not made it back to gyn.  Patient has MRI brain, cervical and lumbar spine scheduled for later this month for headaches, bilateral arm and leg weakness/paresthesias.   Last iron iron infusions in February 2022.  Weight down 20 pounds since December 2021.  CT abdomen pelvis with contrast July 04, 2020: No gross morphological findings of cirrhosis.  Prior splenectomy.  Resolving  postoperative collection in the splenectomy bed, improving.  No acute findings.   Current Outpatient Medications  Medication Sig Dispense Refill  . acetaminophen (TYLENOL) 325 MG tablet Take 2 tablets (650 mg total) by mouth every 6 (six) hours as needed.    Marland Kitchen. albuterol (VENTOLIN HFA) 108 (90 Base) MCG/ACT inhaler Inhale 2 puffs into the lungs every 6 (six) hours as needed for wheezing or shortness of breath. 8 g 0  . apixaban (ELIQUIS) 5 MG TABS tablet Take 1 tablet (5 mg total) by mouth 2 (two) times daily. 60 tablet 0  . Calcium Carb-Cholecalciferol (CALCIUM/VITAMIN D PO) Take by mouth daily.    . cyanocobalamin (,VITAMIN B-12,) 1000 MCG/ML injection Inject 1 mL (1,000 mcg total) into the muscle every 30 (thirty) days. 3 mL 1  . Cyanocobalamin (VITAMIN B-12 IJ) Inject as directed every 30 (thirty) days.    Marland Kitchen. gabapentin (NEURONTIN) 100 MG capsule Take 1 capsule at bedtime for a week, then increase to 2 capsules at bedtime 60 capsule 0  . loratadine (CLARITIN) 10 MG tablet Take 10 mg by mouth daily.    . methocarbamol (ROBAXIN) 500 MG tablet TAKE 1 TABLET BY MOUTH FOUR TIMES DAILY AS NEEDED 60 tablet 1  . MILK THISTLE PO Take by mouth daily.    . ondansetron (ZOFRAN-ODT) 4 MG disintegrating tablet Take 1 tablet (4 mg total) by mouth every 8 (eight) hours as needed for nausea or vomiting. 20 tablet 0  . OVER THE COUNTER MEDICATION Premeir protein drink once a day    . traMADol (ULTRAM) 50 MG tablet Take 1 tablet (50 mg total) by mouth every 6 (six)  hours as needed. 30 tablet 1  . ursodiol (ACTIGALL) 500 MG tablet Take 1 tablet (500 mg total) by mouth 2 (two) times daily. 60 tablet 3   No current facility-administered medications for this visit.    Allergies as of 08/07/2020 - Review Complete 08/07/2020  Allergen Reaction Noted  . Penicillins Anaphylaxis 10/18/2011  . Lidocaine Other (See Comments) 06/22/2014    ROS:  General: Negative for  fever, chills, fatigue. See hpi ENT: Negative  for hoarseness,  nasal congestion. See hpi CV: Negative for chest pain, angina, palpitations, dyspnea on exertion, peripheral edema.  Respiratory: Negative for dyspnea at rest, dyspnea on exertion, cough, sputum, wheezing.  GI: See history of present illness. GU:  Negative for dysuria, hematuria, urinary incontinence, urinary frequency, nocturnal urination.  Endo: see hpi   Physical Examination:   BP 107/72   Pulse 86   Temp 97.7 F (36.5 C) (Temporal)   Ht 5\' 4"  (1.626 m)   Wt 138 lb 3.2 oz (62.7 kg)   LMP 08/01/2020 (Approximate)   BMI 23.72 kg/m   General: Well-nourished, well-developed in no acute distress.  Eyes: No icterus. Mouth: masked Lungs: Clear to auscultation bilaterally.  Heart: Regular rate and rhythm, no murmurs rubs or gallops.  Abdomen: Bowel sounds are normal, nondistended, no hepatosplenomegaly or masses, no abdominal bruits or hernia , no rebound or guarding.  Mild to moderate epigastric tenderness Extremities: No lower extremity edema. No clubbing or deformities. Neuro: Alert and oriented x 4   Skin: Warm and dry, no jaundice.   Psych: Alert and cooperative, normal mood and affect.  Labs:  Lab Results  Component Value Date   FOLATE 13.2 05/02/2020   Lab Results  Component Value Date   VITAMINB12 330 05/02/2020   Lab Results  Component Value Date   IRON 45 05/02/2020   TIBC 431 05/02/2020   FERRITIN 15 05/02/2020   Lab Results  Component Value Date   WBC 9.8 05/02/2020   HGB 13.1 05/02/2020   HCT 42.0 05/02/2020   MCV 92.9 05/02/2020   PLT 442 (H) 05/02/2020   Lab Results  Component Value Date   ALT 15 05/02/2020   AST 21 05/02/2020   ALKPHOS 77 05/02/2020   BILITOT 0.6 05/02/2020   Lab Results  Component Value Date   CREATININE 0.67 05/02/2020   BUN 7 05/02/2020   NA 136 05/02/2020   K 3.8 05/02/2020   CL 106 05/02/2020   CO2 25 05/02/2020   Lab Results  Component Value Date   LIPASE 33 03/15/2020     Imaging  Studies: NCV with EMG(electromyography)  Result Date: 08/06/2020 10/06/2020, DO     08/06/2020  3:00 PM [facility] [address] [place], [address]  [place], [place] Tel: [phone] Fax:  [phone] Test Date:  08/06/2020 Patient: Oneika Simonian DOB: Jul 25, 1975 Physician: 03/25/1976, DO Sex: Female Height: 5\' 3"  Ref Phys: Nita Sickle ID#:   Technician:  Patient Complaints: This is a 45 year old female referred for evaluation of bilateral hand paresthesias. NCV & EMG Findings: Extensive electrodiagnostic testing of the right upper extremity and additional studies of the left shows: 1. Bilateral median, ulnar, and mixed palmar sensory responses are within normal limits. 2. Bilateral median and ulnar motor responses are within normal limits. 3. There is no evidence of active or chronic motor axonal loss changes affecting any of the tested muscles.  Motor unit configuration and recruitment pattern is within normal limits. Impression: This is a  normal study of the upper extremities.  In particular, there is no evidence of carpal tunnel syndrome or a cervical radiculopathy. ___________________________ Nita Sickle, DO Nerve Conduction Studies Anti Sensory Summary Table  Stim Site NR Peak (ms) Norm Peak (ms) P-T Amp (V) Norm P-T Amp Left Median Anti Sensory (2nd Digit)  32C Wrist    2.8 <3.4 58.2 >20 Right Median Anti Sensory (2nd Digit)  32C Wrist    3.0 <3.4 53.2 >20 Left Ulnar Anti Sensory (5th Digit)  32C Wrist    2.7 <3.1 51.8 >12 Right Ulnar Anti Sensory (5th Digit)  32C Wrist    2.7 <3.1 49.7 >12 Motor Summary Table  Stim Site NR Onset (ms) Norm Onset (ms) O-P Amp (mV) Norm O-P Amp Site1 Site2 Delta-0 (ms) Dist (cm) Vel (m/s) Norm Vel (m/s) Left Median Motor (Abd Poll Brev)  32C Wrist    2.7 <3.9 12.3 >6 Elbow Wrist 4.5 25.0 56 >50 Elbow    7.2  12.1        Right Median Motor (Abd Poll Brev)  32C Wrist    2.7 <3.9 13.5 >6 Elbow Wrist 4.6 27.0 59 >50 Elbow    7.3  13.2         Left Ulnar Motor (Abd Dig Minimi)  32C Wrist    2.1 <3.1 14.0 >7 B Elbow Wrist 3.1 20.0 65 >50 B Elbow    5.2  13.7  A Elbow B Elbow 1.7 10.0 59 >50 A Elbow    6.9  13.6        Right Ulnar Motor (Abd Dig Minimi)  32C Wrist    2.2 <3.1 10.5 >7 B Elbow Wrist 3.4 23.0 68 >50 B Elbow    5.6  9.7  A Elbow B Elbow 1.4 10.0 71 >50 A Elbow    7.0  9.7        Comparison Summary Table  Stim Site NR Peak (ms) Norm Peak (ms) P-T Amp (V) Site1 Site2 Delta-P (ms) Norm Delta (ms) Left Median/Ulnar Palm Comparison (Wrist - 8cm)  32C Median Palm    1.6 <2.2 71.3 Median Palm Ulnar Palm 0.1  Ulnar Palm    1.5 <2.2 24.7     Right Median/Ulnar Palm Comparison (Wrist - 8cm)  32C Median Palm    1.7 <2.2 63.6 Median Palm Ulnar Palm 0.2  Ulnar Palm    1.5 <2.2 35.2     EMG  Side Muscle Ins Act Fibs Psw Fasc Number Recrt Dur Dur. Amp Amp. Poly Poly. Comment Right 1stDorInt Nml Nml Nml Nml Nml Nml Nml Nml Nml Nml Nml Nml N/A Right PronatorTeres Nml Nml Nml Nml Nml Nml Nml Nml Nml Nml Nml Nml N/A Right Biceps Nml Nml Nml Nml Nml Nml Nml Nml Nml Nml Nml Nml N/A Right Triceps Nml Nml Nml Nml Nml Nml Nml Nml Nml Nml Nml Nml N/A Right Deltoid Nml Nml Nml Nml Nml Nml Nml Nml Nml Nml Nml Nml N/A Left 1stDorInt Nml Nml Nml Nml Nml Nml Nml Nml Nml Nml Nml Nml N/A Left PronatorTeres Nml Nml Nml Nml Nml Nml Nml Nml Nml Nml Nml Nml N/A Left Biceps Nml Nml Nml Nml Nml Nml Nml Nml Nml Nml Nml Nml N/A Left Triceps Nml Nml Nml Nml Nml Nml Nml Nml Nml Nml Nml Nml N/A Left Deltoid Nml Nml Nml Nml Nml Nml Nml Nml Nml Nml Nml Nml N/A Waveforms:             Assessment/Plan:  Pleasant 45 year old  female with history of chronic liver disease, alcoholic hepatitis, elevated AMA empirically treated with Urso for 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  Admission back in November for splenic laceration requiring splenectomy.  Underwent liver biopsy based on nodular appearing liver at time of surgery.  Course complicated by development of portal venous  thrombosis, now on Eliquis.  Chronic liver disease: History of remote prior alcoholic hepatitis, no alcohol in over 4 years. 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  Suspected significant fibrosis/cirrhosis given grossly nodular appearing liver at time of recent laparotomy.Biopsy showed mildly active steatohepatitis, alcoholic and/or nonalcoholic, with mild to moderate fibrosis. Mildly active steatohepatitis (grade 1 of 3). 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   Recent CT imaging from 2022 without overt cirrhosis.   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  Currently on Eliquis 5 mg twice daily.  Followed by hematology.  Abdominal pain: At least in part related to prior splenectomy.  Improving. Persistent chronic right upper quadrant pain as well.  CT without explanation for persisting pain. Will not rule out the possibility of upper endoscopy to further evaluate upper abdominal pain at this time patient wants to postpone until upcoming MRIs are completed.  Dysphagia: Typical reflux well controlled.  Symptoms noted after splenectomy, initially patient felt it was likely related to intubation.  Symptoms persist.  May require EGD for further evaluation.  Await MRI findings.    1. Start pantoprazole 40 mg twice daily before meals. 2. Continue Urso 500 mg twice daily. 3. After MRI was completed, patient will let us know if she wants to pursue endoscopy.  If nothing seen on MRIs to explain abdominal pain, would consider endoscopy as next step.  May benefit from esophageal dilation as well.  Could screen for esophageal varices at that time as well. 4. We discussed possibility of referral to hepatologist at [facility], Kateri Mc, for [facility].  At this time patient is not interested.  Will reassess at later date. 5. I will review CT with radiologist regarding the nodule  seen in the upper abdomen, likely incidental finding. 6. Return to the office in 4 months or call sooner if needed.

## 2020-08-07 NOTE — Patient Instructions (Signed)
1. Start pantoprazole 40mg  twice daily, 30 minutes before breakfast and 30 minutes before your evening meal. 2. After you complete MRIs, please let me know if you want to go ahead and pursue upper endoscopy.  If nothing seen on MRIs to explain your abdominal pain, would definitely consider endoscopy as the next step. 3. I will look at CT scan with radiologist regarding the nodule seen in the upper abdomen.  I do not suspect that this is contributing to her symptoms but we want to make sure that it is stable.  Further recommendations. 4. Return to the office in 4 months or call sooner if needed.

## 2020-08-08 ENCOUNTER — Other Ambulatory Visit: Payer: Self-pay | Admitting: Internal Medicine

## 2020-08-08 DIAGNOSIS — J45909 Unspecified asthma, uncomplicated: Secondary | ICD-10-CM

## 2020-08-09 ENCOUNTER — Other Ambulatory Visit (HOSPITAL_COMMUNITY): Payer: Self-pay | Admitting: *Deleted

## 2020-08-09 DIAGNOSIS — D509 Iron deficiency anemia, unspecified: Secondary | ICD-10-CM

## 2020-08-09 DIAGNOSIS — D539 Nutritional anemia, unspecified: Secondary | ICD-10-CM

## 2020-08-12 ENCOUNTER — Inpatient Hospital Stay (HOSPITAL_COMMUNITY): Payer: Medicaid Other | Attending: Hematology

## 2020-08-12 ENCOUNTER — Other Ambulatory Visit: Payer: Self-pay

## 2020-08-12 DIAGNOSIS — D509 Iron deficiency anemia, unspecified: Secondary | ICD-10-CM | POA: Diagnosis present

## 2020-08-12 DIAGNOSIS — E559 Vitamin D deficiency, unspecified: Secondary | ICD-10-CM | POA: Insufficient documentation

## 2020-08-12 DIAGNOSIS — D539 Nutritional anemia, unspecified: Secondary | ICD-10-CM

## 2020-08-12 LAB — FOLATE: Folate: 9.6 ng/mL (ref >5.9)

## 2020-08-12 LAB — COMPREHENSIVE METABOLIC PANEL
ALT: 19 U/L (ref 0–44)
AST: 20 U/L (ref 15–41)
Albumin: 3.8 g/dL (ref 3.5–5.0)
Alkaline Phosphatase: 71 U/L (ref 38–126)
Anion gap: 6 (ref 5–15)
BUN: 9 mg/dL (ref 6–20)
CO2: 27 mmol/L (ref 22–32)
Calcium: 9.4 mg/dL (ref 8.9–10.3)
Chloride: 105 mmol/L (ref 98–111)
Creatinine, Ser: 0.62 mg/dL (ref 0.44–1.00)
GFR, Estimated: >60 mL/min (ref >60)
Glucose, Bld: 105 mg/dL — ABNORMAL HIGH (ref 70–99)
Potassium: 3.9 mmol/L (ref 3.5–5.1)
Sodium: 138 mmol/L (ref 135–145)
Total Bilirubin: 0.7 mg/dL (ref 0.3–1.2)
Total Protein: 6.9 g/dL (ref 6.5–8.1)

## 2020-08-12 LAB — CBC WITH DIFFERENTIAL/PLATELET
Abs Immature Granulocytes: 0.03 10*3/uL (ref 0.00–0.07)
Basophils Absolute: 0.1 10*3/uL (ref 0.0–0.1)
Basophils Relative: 1 %
Eosinophils Absolute: 0.4 10*3/uL (ref 0.0–0.5)
Eosinophils Relative: 4 %
HCT: 42.3 % (ref 36.0–46.0)
Hemoglobin: 13.8 g/dL (ref 12.0–15.0)
Immature Granulocytes: 0 %
Lymphocytes Relative: 38 %
Lymphs Abs: 4.3 10*3/uL — ABNORMAL HIGH (ref 0.7–4.0)
MCH: 30.6 pg (ref 26.0–34.0)
MCHC: 32.6 g/dL (ref 30.0–36.0)
MCV: 93.8 fL (ref 80.0–100.0)
Monocytes Absolute: 0.8 10*3/uL (ref 0.1–1.0)
Monocytes Relative: 7 %
Neutro Abs: 5.6 10*3/uL (ref 1.7–7.7)
Neutrophils Relative %: 50 %
Platelets: 408 10*3/uL — ABNORMAL HIGH (ref 150–400)
RBC: 4.51 MIL/uL (ref 3.87–5.11)
RDW: 13.5 % (ref 11.5–15.5)
WBC: 11.3 10*3/uL — ABNORMAL HIGH (ref 4.0–10.5)
nRBC: 0 % (ref 0.0–0.2)

## 2020-08-12 LAB — IRON AND TIBC
Iron: 104 ug/dL (ref 28–170)
Saturation Ratios: 37 % — ABNORMAL HIGH (ref 10.4–31.8)
TIBC: 280 ug/dL (ref 250–450)
UIBC: 176 ug/dL

## 2020-08-12 LAB — VITAMIN B12: Vitamin B-12: 568 pg/mL (ref 180–914)

## 2020-08-12 LAB — VITAMIN D 25 HYDROXY (VIT D DEFICIENCY, FRACTURES): Vit D, 25-Hydroxy: 26.78 ng/mL — ABNORMAL LOW (ref 30–100)

## 2020-08-12 LAB — FERRITIN: Ferritin: 122 ng/mL (ref 11–307)

## 2020-08-12 NOTE — Progress Notes (Signed)
Cc'ed to pcp °

## 2020-08-15 ENCOUNTER — Inpatient Hospital Stay (HOSPITAL_COMMUNITY): Payer: Medicaid Other | Admitting: Physician Assistant

## 2020-08-15 ENCOUNTER — Ambulatory Visit (HOSPITAL_COMMUNITY)
Admission: RE | Admit: 2020-08-15 | Discharge: 2020-08-15 | Disposition: A | Discharge status: Home / Self Care | Payer: Medicaid Other | Source: Ambulatory Visit | Attending: Internal Medicine | Admitting: Internal Medicine

## 2020-08-15 ENCOUNTER — Other Ambulatory Visit: Payer: Self-pay

## 2020-08-15 ENCOUNTER — Ambulatory Visit (INDEPENDENT_AMBULATORY_CARE_PROVIDER_SITE_OTHER): Payer: Medicaid Other | Admitting: Internal Medicine

## 2020-08-15 ENCOUNTER — Encounter: Payer: Self-pay | Admitting: Internal Medicine

## 2020-08-15 VITALS — BP 119/79 | HR 78 | Temp 98.3°F | Resp 18 | Ht 63.0 in | Wt 139.8 lb

## 2020-08-15 DIAGNOSIS — I81 Portal vein thrombosis: Secondary | ICD-10-CM | POA: Diagnosis not present

## 2020-08-15 DIAGNOSIS — M2550 Pain in unspecified joint: Secondary | ICD-10-CM | POA: Diagnosis not present

## 2020-08-15 DIAGNOSIS — M7989 Other specified soft tissue disorders: Secondary | ICD-10-CM | POA: Insufficient documentation

## 2020-08-15 DIAGNOSIS — Z720 Tobacco use: Secondary | ICD-10-CM

## 2020-08-15 DIAGNOSIS — J45909 Unspecified asthma, uncomplicated: Secondary | ICD-10-CM

## 2020-08-15 DIAGNOSIS — K743 Primary biliary cirrhosis: Secondary | ICD-10-CM

## 2020-08-15 DIAGNOSIS — Z124 Encounter for screening for malignant neoplasm of cervix: Secondary | ICD-10-CM

## 2020-08-15 DIAGNOSIS — R1013 Epigastric pain: Secondary | ICD-10-CM

## 2020-08-15 MED ORDER — FLOVENT HFA 44 MCG/ACT IN AERO: 2.0000 | INHALATION_SPRAY | Freq: Two times a day (BID) | RESPIRATORY_TRACT | 5 refills | Status: DC

## 2020-08-15 NOTE — Assessment & Plan Note (Signed)
On Pantoprazole for GERD and dysphagia Was advised to get EGD by GI, patient will discuss with GI later.

## 2020-08-15 NOTE — Assessment & Plan Note (Signed)
Smokes about 5 cigarettes per day  Asked about quitting: confirms they are currently smokes cigarettes Advise to quit smoking: Educated about QUITTING to reduce the risk of cancer, cardio and cerebrovascular disease. Assess willingness: Unwilling to quit at this time, but is working on cutting back. Assist with counseling and pharmacotherapy: Counseled for 5 minutes and literature provided. Arrange for follow up: (if quitting, follow up 1 week after last cigarette, congratulate at each visit post quitting) If not quitting follow up in 3 months and continue to offer help.

## 2020-08-15 NOTE — Assessment & Plan Note (Addendum)
Reports swelling over right calf and posterior thigh Considering h/o PE and portal vein thrombosis, will check [place] RLE to r/o DVT Continue Eliquis Could be related to varicose vein due to previous h/o prolonged standing at work

## 2020-08-15 NOTE — Assessment & Plan Note (Signed)
Had given a trial of Albuterol PRN, minimal relief Added Flovent Referred to Pulmonology for possible need for PFTs

## 2020-08-15 NOTE — Assessment & Plan Note (Signed)
On Eliquis F/u with GI and Heme/Onc.

## 2020-08-15 NOTE — Patient Instructions (Signed)
Please start using Flovent as prescribed. Continue to use Albuterol as needed for shortness of breath/wheezing.  You are being referred to Pulmonology for evaluation of asthma and lung function testing.  You are being scheduled for [place] of right leg.  Continue to take medications as prescribed.

## 2020-08-15 NOTE — Progress Notes (Addendum)
Established Patient Office Visit  Subjective:  Patient ID: Kara FleetStephanie Cranston, female    DOB: 25-Apr-1975  Age: 45 y.o. MRN: [id number]  CC:  Chief Complaint  Patient presents with  . Follow-up    4 month follow up pt still having sob even when using inhaler gets exertion when exercising also on the right leg pt is having pain she has a big vein that runs all the way up leg she was doing PT but they stopped this due to needing MRI and the PT wasn't helping there is no muscle building in that leg     HPI Kara FleetStephanie Stewart is a 45 year old female with past medical history of PBC, macrocytic anemia, recurrent falls, splenic laceration s/p splenectomy and portal vein thrombosis who presents for follow up of her chronic medical conditions.  She complains of right calf and posterior aspect of right thigh swelling for last few days.  Denies any recent prolonged immobilization.  Has been taking Eliquis regularly.  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  She still has epigastric pain, which is chronic, constant and worse with coughing.  She takes pantoprazole for GERD.  She follows up with GI for PBC and chronic abdominal pain.  She was advised to get EGD by GI recently to evaluate for chronic epigastric pain and dysphagia.  She has chronic decreased appetite and has lost about 10 LB's since the last visit in 04/2020.  She recently completed physical therapy for deconditioning.  PT recommended imaging evaluation as she was not improving with her ambulation.  She is scheduled to get MRI of the lumbar spine in the next week.  She continues to have dyspnea, especially worse with exertion.  She denies any chest pain or palpitations.  She states that her chest tightness improves with albuterol inhaler minimally.  Past Medical History:  Diagnosis Date  . Acid reflux   . 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 Alcoholic hepatitis without ascites    . Anxiety   . Asthma   . Closed fracture of wrist 03/06/2020  . Colitis   . GERD (gastroesophageal reflux disease) 07/21/2012  . Hiatal hernia   . History of alcohol abuse 04/26/2015  . History of colitis 11/29/2014  . Hypertriglyceridemia   . Hypothyroid   . Iritis    frequent  . Ovarian cyst   . Panic attacks   . SVT (supraventricular tachycardia) (HCC)    last issue 12/2017    Past Surgical History:  Procedure Laterality Date  . BIOPSY N/A 02/05/2015   Procedure: BIOPSY;  Surgeon: West BaliSandi L Fields, MD;  Location: [facility];  Service: Endoscopy;  Laterality: N/A;  . COLONOSCOPY    . COLONOSCOPY WITH PROPOFOL N/A 02/05/2015   WUJ:[id number]:small HH/mild diverticulosis  . ESOPHAGEAL DILATION N/A 11/21/2015   Procedure: ESOPHAGEAL DILATION;  Surgeon: Corbin Adeobert M Rourk, MD;  [facility]: [facility];  Service: Endoscopy;  Laterality: N/A;  . ESOPHAGOGASTRODUODENOSCOPY  11/06/2011   [name]: MILD Esophagitis/PATENT ESOPHAGEAL Stricture/  Moderate gastritis. Bx no.hpylori or celiac, +gastritis  . ESOPHAGOGASTRODUODENOSCOPY (EGD) WITH PROPOFOL N/A 03/20/2014   [name]: 1. Mild esophagitis & distal esophagela stricture. 2. small hiatal hernia 3. moderate non-erosive gastritis and mild duodenits  . ESOPHAGOGASTRODUODENOSCOPY (EGD) WITH PROPOFOL N/A 11/21/2015   Dr. Jena Gaussourk: LA grade B esophagitis, MW tear likely source of hematemesis  . LAPAROSCOPIC TUBAL LIGATION Bilateral 09/14/2018   Procedure: LAPAROSCOPIC TUBAL LIGATION;  Surgeon: Marice Potterove,  Leanora Ivanoff, MD;  Location: [facility];  Service: Gynecology;  Laterality: Bilateral;  . LIVER BIOPSY  02/23/2020   Procedure: OPEN LIVER BIOPSY;  Surgeon: Fritzi Mandes, MD;  Location: [facility] OR;  Service: General;;  . Gaspar Bidding DILATION N/A 03/20/2014   Procedure: SAVORY DILATION;  Surgeon: West Bali, MD;  Location: [facility];  Service: Endoscopy;  Laterality: N/A;  dilated with # 12.8, 14,15,16  . SPLENECTOMY, TOTAL N/A 02/23/2020   Procedure: SPLENECTOMY;  Surgeon: Fritzi Mandes, MD;  Location: [facility] OR;  Service: General;  Laterality: N/A;  . TOOTH EXTRACTION      Family History  Problem Relation Age of Onset  . Stomach cancer Paternal Grandfather        colon cancer  . Cancer Paternal Grandfather        throat and esophagus  . Breast cancer Maternal Grandmother   . Cancer Maternal Grandmother        skin  . Anxiety disorder Maternal Grandmother   . Hypertension Mother   . Other Mother        fatty liver  . Hyperlipidemia Mother   . Liver disease Mother        fatty liver, does not drink.   . Sleep apnea Mother   . Other Father        varicose veins; stomach issues; hernia  . Hypertension Father   . Hyperlipidemia Father   . Cancer Father        prostate  . Arthritis Father        rheumatoid  . Neuropathy Father   . Rheum arthritis Father   . Thyroid disease Sister   . Hypertension Sister   . Other Brother        hernia  . Diabetes Maternal Grandfather   . Heart disease Maternal Grandfather   . Other Paternal Grandmother        hernia  . COPD Paternal Grandmother   . Diabetes Paternal Grandmother   . Healthy Daughter     Social History   Socioeconomic History  . Marital status: Divorced    Spouse name: Not on file  . Number of children: 0  . Years of education: Not on file  . Highest education level: Not on file  Occupational History  . Occupation: was at [employer] but not working now  Tobacco Use  . Smoking status: Current Some Day Smoker    Types: Cigarettes    Last attempt to quit: 12/22/2017    Years since quitting: 2.6  . Smokeless tobacco: Never Used  . Tobacco comment: "might smoke one on a bad day"  Vaping Use  . Vaping Use: Never used  Substance and Sexual Activity  . Alcohol use: No    Alcohol/week: 0.0 standard drinks    Comment: Sober since 2017  . Drug use: Never  . Sexual activity: Yes    Birth control/protection: Condom  Other Topics Concern  . Not on file  Social History Narrative   Lives alone with a  roommate. Dating and in safe relationship. Does not smoke. Denies drug use.    Previous MD: Phill Mutter, NP ([place], [place])   Right handed   Drinks caffeine   One story home   Social Determinants of Health   Financial Resource Strain: High Risk  . Difficulty of Paying Living Expenses: Hard  Food Insecurity: No Food Insecurity  . Worried About Programme researcher, broadcasting/film/video in the Last Year: Never true  .  Ran Out of Food in the Last Year: Never true  Transportation Needs: No Transportation Needs  . Lack of Transportation (Medical): No  . Lack of Transportation (Non-Medical): No  Physical Activity: Inactive  . Days of Exercise per Week: 0 days  . Minutes of Exercise per Session: 0 min  Stress: Stress Concern Present  . Feeling of Stress : Rather much  Social Connections: Moderately Isolated  . Frequency of Communication with Friends and Family: More than three times a week  . Frequency of Social Gatherings with Friends and Family: Twice a week  . Attends Religious Services: More than 4 times per year  . Active Member of Clubs or Organizations: No  . Attends Banker Meetings: Never  . Marital Status: Divorced  Catering manager Violence: Not At Risk  . Fear of Current or Ex-Partner: No  . Emotionally Abused: No  . Physically Abused: No  . Sexually Abused: No    Outpatient Medications Prior to Visit  Medication Sig Dispense Refill  . acetaminophen (TYLENOL) 325 MG tablet Take 2 tablets (650 mg total) by mouth every 6 (six) hours as needed.    Marland Kitchen apixaban (ELIQUIS) 5 MG TABS tablet Take 1 tablet (5 mg total) by mouth 2 (two) times daily. 60 tablet 0  . Calcium Carb-Cholecalciferol (CALCIUM/VITAMIN D PO) Take by mouth daily.    . cyanocobalamin (,VITAMIN B-12,) 1000 MCG/ML injection Inject 1 mL (1,000 mcg total) into the muscle every 30 (thirty) days. 3 mL 1  . Cyanocobalamin (VITAMIN B-12 IJ) Inject as directed every 30 (thirty) days.    Marland Kitchen gabapentin (NEURONTIN) 100 MG capsule Take 1  capsule at bedtime for a week, then increase to 2 capsules at bedtime 60 capsule 0  . loratadine (CLARITIN) 10 MG tablet Take 10 mg by mouth daily.    . methocarbamol (ROBAXIN) 500 MG tablet TAKE 1 TABLET BY MOUTH FOUR TIMES DAILY AS NEEDED 60 tablet 1  . MILK THISTLE PO Take by mouth daily.    . ondansetron (ZOFRAN-ODT) 4 MG disintegrating tablet Take 1 tablet (4 mg total) by mouth every 8 (eight) hours as needed for nausea or vomiting. 20 tablet 0  . OVER THE COUNTER MEDICATION Premeir protein drink once a day    . pantoprazole (PROTONIX) 40 MG tablet Take 1 tablet (40 mg total) by mouth 2 (two) times daily before a meal. 180 tablet 1  . PROAIR HFA 108 (90 Base) MCG/ACT inhaler INHALE 2 PUFFS INTO THE LUNGS EVERY 6 HOURS AS NEEDED FOR WHEEZING OR SHORTNESS OF BREATH 8.5 g 1  . traMADol (ULTRAM) 50 MG tablet Take 1 tablet (50 mg total) by mouth every 6 (six) hours as needed. 30 tablet 1  . ursodiol (ACTIGALL) 500 MG tablet Take 1 tablet (500 mg total) by mouth 2 (two) times daily. 60 tablet 3   No facility-administered medications prior to visit.    Allergies  Allergen Reactions  . Penicillins Anaphylaxis    Has patient had a PCN reaction causing immediate rash, facial/tongue/throat swelling, SOB or lightheadedness with hypotension: yes Has patient had a PCN reaction causing severe rash involving mucus membranes or skin necrosis: No Has patient had a PCN reaction that required hospitalization Yes Has patient had a PCN reaction occurring within the last 10 years: No If all of the above answers are "NO", then may proceed with Cephalosporin use.   . Lidocaine Other (See Comments)    Can use topical lidocaine but if ingested causes Hallucinations.  ROS Review of Systems  Constitutional: Positive for fatigue. Negative for chills and fever.  HENT: Negative for congestion, sinus pressure, sinus pain and sore throat.   Eyes: Negative for pain and discharge.  Respiratory: Positive for  shortness of breath. Negative for cough.   Cardiovascular: Negative for chest pain and palpitations.  Gastrointestinal: Positive for abdominal pain. Negative for constipation, diarrhea, nausea and vomiting.  Endocrine: Negative for polydipsia and polyuria.  Genitourinary: Negative for dysuria and hematuria.  Musculoskeletal: Positive for arthralgias, back pain and myalgias. Negative for neck stiffness.  Skin: Negative for rash.  Neurological: Negative for dizziness, syncope, weakness and headaches.  Psychiatric/Behavioral: Negative for agitation and behavioral problems.      Objective:    Physical Exam Vitals reviewed.  Constitutional:      General: She is not in acute distress.    Appearance: She is not diaphoretic.  HENT:     Head: Normocephalic.     Nose: Nose normal.     Mouth/Throat:     Mouth: Mucous membranes are moist.  Eyes:     General: No scleral icterus.    Extraocular Movements: Extraocular movements intact.     Pupils: Pupils are equal, round, and reactive to light.  Cardiovascular:     Rate and Rhythm: Normal rate and regular rhythm.     Pulses: Normal pulses.     Heart sounds: Normal heart sounds. No murmur heard.   Pulmonary:     Breath sounds: Normal breath sounds. No wheezing or rales.  Abdominal:     Palpations: Abdomen is soft.     Tenderness: There is abdominal tenderness (Mild epigastric). There is no guarding or rebound.  Musculoskeletal:     Cervical back: Neck supple. No tenderness.     Right lower leg: No edema.     Left lower leg: No edema.     Comments: Tortuous veins over right LE  Skin:    General: Skin is warm.  Neurological:     General: No focal deficit present.     Mental Status: She is alert and oriented to person, place, and time.  Psychiatric:        Mood and Affect: Mood normal.        Behavior: Behavior normal.     BP 119/79 (BP Location: Left Arm, Patient Position: Sitting, Cuff Size: Normal)   Pulse 78   Temp 98.3 F  (36.8 C) (Oral)   Resp 18   Ht 5\' 3"  (1.6 m)   Wt 139 lb 12.8 oz (63.4 kg)   LMP 08/01/2020 (Approximate)   SpO2 100%   BMI 24.76 kg/m  Wt Readings from Last 3 Encounters:  08/15/20 139 lb 12.8 oz (63.4 kg)  08/07/20 138 lb 3.2 oz (62.7 kg)  07/16/20 142 lb (64.4 kg)     Health Maintenance Due  Topic Date Due  . COVID-19 Vaccine (1) Never done  . PAP SMEAR-Modifier  Never done    There are no preventive care reminders to display for this patient.  Lab Results  Component Value Date   TSH 2.958 02/24/2020   Lab Results  Component Value Date   WBC 11.3 (H) 08/12/2020   HGB 13.8 08/12/2020   HCT 42.3 08/12/2020   MCV 93.8 08/12/2020   PLT 408 (H) 08/12/2020   Lab Results  Component Value Date   NA 138 08/12/2020   K 3.9 08/12/2020   CO2 27 08/12/2020   GLUCOSE 105 (H) 08/12/2020   BUN 9 08/12/2020  CREATININE 0.62 08/12/2020   BILITOT 0.7 08/12/2020   ALKPHOS 71 08/12/2020   AST 20 08/12/2020   ALT 19 08/12/2020   PROT 6.9 08/12/2020   ALBUMIN 3.8 08/12/2020   CALCIUM 9.4 08/12/2020   ANIONGAP 6 08/12/2020   Lab Results  Component Value Date   CHOL 166 01/27/2017   Lab Results  Component Value Date   HDL 45 (L) 01/27/2017   Lab Results  Component Value Date   LDLCALC 101 (H) 01/27/2017   Lab Results  Component Value Date   TRIG 103 01/27/2017   Lab Results  Component Value Date   CHOLHDL 3.7 01/27/2017   No results found for: HGBA1C    Assessment & Plan:   Problem List Items Addressed This Visit      Cardiovascular and Mediastinum   Deep vein thrombosis of portal vein    On Eliquis F/u with GI and Heme/Onc.        Respiratory   Asthma due to seasonal allergies - Primary    Had given a trial of Albuterol PRN, minimal relief Added Flovent Referred to Pulmonology for possible need for PFTs      Relevant Medications   fluticasone (FLOVENT HFA) 44 MCG/ACT inhaler   Other Relevant Orders   Ambulatory referral to Pulmonology      Digestive   Primary biliary cirrhosis (HCC)    On Ursodiol Follows up with GI On vitamin supplements        Other   Polyarthralgia    Follows up with Rheumatology Tramadol PRN Recently completed PT PT recommended imaging evaluation as she was not improving, planned for MRI lumbar spine      Abdominal pain, epigastric    On Pantoprazole for GERD and dysphagia Was advised to get EGD by GI, patient will discuss with GI later.      Leg swelling    Reports swelling over right calf and posterior thigh Considering h/o PE and portal vein thrombosis, will check [place] RLE to r/o DVT Continue Eliquis Could be related to varicose vein due to previous h/o prolonged standing at work      Relevant Orders   US Venous Img Lower Unilateral Right (Completed)   Tobacco abuse    Smokes about 5 cigarettes per day  Asked about quitting: confirms they are currently smokes cigarettes Advise to quit smoking: Educated about QUITTING to reduce the risk of cancer, cardio and cerebrovascular disease. Assess willingness: Unwilling to quit at this time, but is working on cutting back. Assist with counseling and pharmacotherapy: Counseled for 5 minutes and literature provided. Arrange for follow up: (if quitting, follow up 1 week after last cigarette, congratulate at each visit post quitting) If not quitting follow up in 3 months and continue to offer help.       Other Visit Diagnoses    Routine cervical smear       Relevant Orders   Ambulatory referral to Obstetrics / Gynecology      Meds ordered this encounter  Medications  . fluticasone (FLOVENT HFA) 44 MCG/ACT inhaler    Sig: Inhale 2 puffs into the lungs 2 (two) times daily.    Dispense:  1 each    Refill:  5    Follow-up: Return in about 4 months (around 12/16/2020) for Routine follow up.    Anabel Halon, MD

## 2020-08-15 NOTE — Assessment & Plan Note (Signed)
On Ursodiol Follows up with GI On vitamin supplements

## 2020-08-15 NOTE — Assessment & Plan Note (Addendum)
Follows up with Rheumatology Tramadol PRN Recently completed PT PT recommended imaging evaluation as she was not improving, planned for MRI lumbar spine

## 2020-08-19 ENCOUNTER — Ambulatory Visit (HOSPITAL_COMMUNITY): Payer: Medicaid Other | Admitting: Hematology

## 2020-08-20 ENCOUNTER — Ambulatory Visit
Admission: RE | Admit: 2020-08-20 | Discharge: 2020-08-20 | Disposition: A | Discharge status: Home / Self Care | Payer: Medicaid Other | Source: Ambulatory Visit | Attending: Neurology | Admitting: Neurology

## 2020-08-20 ENCOUNTER — Other Ambulatory Visit: Payer: Self-pay

## 2020-08-20 DIAGNOSIS — G8929 Other chronic pain: Secondary | ICD-10-CM

## 2020-08-20 DIAGNOSIS — M545 Low back pain, unspecified: Secondary | ICD-10-CM | POA: Diagnosis not present

## 2020-08-20 DIAGNOSIS — R519 Headache, unspecified: Secondary | ICD-10-CM

## 2020-08-20 DIAGNOSIS — M4802 Spinal stenosis, cervical region: Secondary | ICD-10-CM | POA: Diagnosis not present

## 2020-08-20 DIAGNOSIS — R29898 Other symptoms and signs involving the musculoskeletal system: Secondary | ICD-10-CM

## 2020-08-20 DIAGNOSIS — R413 Other amnesia: Secondary | ICD-10-CM

## 2020-08-20 DIAGNOSIS — R292 Abnormal reflex: Secondary | ICD-10-CM

## 2020-08-21 ENCOUNTER — Ambulatory Visit
Admission: RE | Admit: 2020-08-21 | Discharge: 2020-08-21 | Disposition: A | Discharge status: Home / Self Care | Payer: Medicaid Other | Source: Ambulatory Visit | Attending: Neurology | Admitting: Neurology

## 2020-08-21 DIAGNOSIS — R5381 Other malaise: Secondary | ICD-10-CM | POA: Diagnosis not present

## 2020-08-21 DIAGNOSIS — J341 Cyst and mucocele of nose and nasal sinus: Secondary | ICD-10-CM | POA: Diagnosis not present

## 2020-08-21 DIAGNOSIS — R531 Weakness: Secondary | ICD-10-CM | POA: Diagnosis not present

## 2020-08-21 DIAGNOSIS — R413 Other amnesia: Secondary | ICD-10-CM | POA: Diagnosis not present

## 2020-08-21 MED ORDER — GADOBENATE DIMEGLUMINE 529 MG/ML IV SOLN
13.0000 mL | Freq: Once | INTRAVENOUS | Status: AC | PRN
  Administered 2020-08-21: 13 mL via INTRAVENOUS

## 2020-08-22 ENCOUNTER — Other Ambulatory Visit: Payer: Self-pay

## 2020-08-22 ENCOUNTER — Encounter: Payer: Self-pay | Admitting: Internal Medicine

## 2020-08-22 DIAGNOSIS — R202 Paresthesia of skin: Secondary | ICD-10-CM

## 2020-08-22 DIAGNOSIS — R29898 Other symptoms and signs involving the musculoskeletal system: Secondary | ICD-10-CM

## 2020-08-23 ENCOUNTER — Other Ambulatory Visit: Payer: Self-pay | Admitting: Internal Medicine

## 2020-08-23 ENCOUNTER — Telehealth: Payer: Self-pay | Admitting: Neurology

## 2020-08-23 DIAGNOSIS — J329 Chronic sinusitis, unspecified: Secondary | ICD-10-CM

## 2020-08-23 MED ORDER — FLUTICASONE PROPIONATE 50 MCG/ACT NA SUSP: 2.0000 | Freq: Every day | NASAL | 6 refills | Status: DC

## 2020-08-23 NOTE — Telephone Encounter (Signed)
Patient called in returning a call. Thinks it's about results.

## 2020-08-26 ENCOUNTER — Other Ambulatory Visit: Payer: Self-pay | Admitting: *Deleted

## 2020-08-26 DIAGNOSIS — J45909 Unspecified asthma, uncomplicated: Secondary | ICD-10-CM

## 2020-08-26 DIAGNOSIS — R1310 Dysphagia, unspecified: Secondary | ICD-10-CM

## 2020-08-27 ENCOUNTER — Other Ambulatory Visit: Payer: Self-pay

## 2020-08-27 ENCOUNTER — Telehealth (INDEPENDENT_AMBULATORY_CARE_PROVIDER_SITE_OTHER): Payer: Medicaid Other | Admitting: Internal Medicine

## 2020-08-27 ENCOUNTER — Encounter: Payer: Self-pay | Admitting: Internal Medicine

## 2020-08-27 DIAGNOSIS — J309 Allergic rhinitis, unspecified: Secondary | ICD-10-CM

## 2020-08-27 DIAGNOSIS — J45909 Unspecified asthma, uncomplicated: Secondary | ICD-10-CM

## 2020-08-27 MED ORDER — AZELASTINE HCL 0.1 % NA SOLN: 1.0000 | Freq: Two times a day (BID) | NASAL | 12 refills | Status: DC

## 2020-08-28 NOTE — Progress Notes (Signed)
Virtual Visit via Telephone Note   This visit type was conducted due to national recommendations for restrictions regarding the COVID-19 Pandemic (e.g. social distancing) in an effort to limit this patient's exposure and mitigate transmission in our community.  Due to her co-morbid illnesses, this patient is at least at moderate risk for complications without adequate follow up.  This format is felt to be most appropriate for this patient at this time.  The patient did not have access to video technology/had technical difficulties with video requiring transitioning to audio format only (telephone).  All issues noted in this document were discussed and addressed.  No physical exam could be performed with this format.  Evaluation Performed:  Follow-up visit  Date:  08/28/2020   ID:  Kara Stewart, DOB July 06, 1975, MRN [id number]  Patient Location: Home Provider Location: Office/Clinic  Participants: Patient Location of Patient: Home Location of Provider: Telehealth Consent was obtain for visit to be over via telehealth. I verified that I am speaking with the correct person using two identifiers.  PCP:  Anabel Halon, MD   Chief Complaint:  Sinus drainage  History of Present Illness:    Kara Stewart is a 45 y.o. female who has a televisit for c/o sinus drainage and nasal congestion in association with cough for last few months. She states that Claritin helps with her allergies better than Zyrtec. But she has not had much help with Flonase. Denies any fever, chills or myalgias. Denies any recent sick contacts.  The patient does not have symptoms concerning for COVID-19 infection (fever, chills, cough, or new shortness of breath).   Past Medical, Surgical, Social History, Allergies, and Medications have been Reviewed.  Past Medical History:  Diagnosis Date  . Acid reflux   . 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 Alcoholic hepatitis without ascites   . Anxiety   . Asthma   .  Closed fracture of wrist 03/06/2020  . Colitis   . GERD (gastroesophageal reflux disease) 07/21/2012  . Hiatal hernia   . History of alcohol abuse 04/26/2015  . History of colitis 11/29/2014  . Hypertriglyceridemia   . Hypothyroid   . Iritis    frequent  . Ovarian cyst   . Panic attacks   . SVT (supraventricular tachycardia) (HCC)    last issue 12/2017   Past Surgical History:  Procedure Laterality Date  . BIOPSY N/A 02/05/2015   Procedure: BIOPSY;  Surgeon: West Bali, MD;  Location: [facility];  Service: Endoscopy;  Laterality: N/A;  . COLONOSCOPY    . COLONOSCOPY WITH PROPOFOL N/A 02/05/2015   [id number] HH/mild diverticulosis  . ESOPHAGEAL DILATION N/A 11/21/2015   Procedure: ESOPHAGEAL DILATION;  Surgeon: Corbin Ade, MD;  [facility];  Service: Endoscopy;  Laterality: N/A;  . ESOPHAGOGASTRODUODENOSCOPY  11/06/2011   [name]: MILD Esophagitis/PATENT ESOPHAGEAL Stricture/  Moderate gastritis. Bx no.hpylori or celiac, +gastritis  . ESOPHAGOGASTRODUODENOSCOPY (EGD) WITH PROPOFOL N/A 03/20/2014   [name]: 1. Mild esophagitis & distal esophagela stricture. 2. small hiatal hernia 3. moderate non-erosive gastritis and mild duodenits  . ESOPHAGOGASTRODUODENOSCOPY (EGD) WITH PROPOFOL N/A 11/21/2015   Dr. Jena Gauss: LA grade B esophagitis, MW tear likely source of hematemesis  . LAPAROSCOPIC TUBAL LIGATION Bilateral 09/14/2018   Procedure: LAPAROSCOPIC TUBAL LIGATION;  Surgeon: Allie Bossier, MD;  Location: [facility];  Service: Gynecology;  Laterality: Bilateral;  . LIVER BIOPSY  02/23/2020   Procedure: OPEN LIVER BIOPSY;  Surgeon: Fritzi Mandes, MD;  Location: [facility];  Service: General;;  . Gaspar Bidding DILATION N/A 03/20/2014   Procedure: SAVORY DILATION;  Surgeon: West Bali, MD;  Location: [facility];  Service: Endoscopy;  Laterality: N/A;  dilated with # 12.8, 14,15,16  . SPLENECTOMY, TOTAL N/A 02/23/2020   Procedure: SPLENECTOMY;  Surgeon: Fritzi Mandes, MD;  Location: [facility];   Service: General;  Laterality: N/A;  . TOOTH EXTRACTION       Current Meds  Medication Sig  . acetaminophen (TYLENOL) 325 MG tablet Take 2 tablets (650 mg total) by mouth every 6 (six) hours as needed.  Marland Kitchen apixaban (ELIQUIS) 5 MG TABS tablet Take 1 tablet (5 mg total) by mouth 2 (two) times daily.  Marland Kitchen azelastine (ASTELIN) 0.1 % nasal spray Place 1 spray into both nostrils 2 (two) times daily. Use in each nostril as directed  . Calcium Carb-Cholecalciferol (CALCIUM/VITAMIN D PO) Take by mouth daily.  . cyanocobalamin (,VITAMIN B-12,) 1000 MCG/ML injection Inject 1 mL (1,000 mcg total) into the muscle every 30 (thirty) days.  . Cyanocobalamin (VITAMIN B-12 IJ) Inject as directed every 30 (thirty) days.  . fluticasone (FLONASE) 50 MCG/ACT nasal spray Place 2 sprays into both nostrils daily.  . fluticasone (FLOVENT HFA) 44 MCG/ACT inhaler Inhale 2 puffs into the lungs 2 (two) times daily.  Marland Kitchen gabapentin (NEURONTIN) 100 MG capsule Take 1 capsule at bedtime for a week, then increase to 2 capsules at bedtime  . loratadine (CLARITIN) 10 MG tablet Take 10 mg by mouth daily.  . methocarbamol (ROBAXIN) 500 MG tablet TAKE 1 TABLET BY MOUTH FOUR TIMES DAILY AS NEEDED  . MILK THISTLE PO Take by mouth daily.  . ondansetron (ZOFRAN-ODT) 4 MG disintegrating tablet Take 1 tablet (4 mg total) by mouth every 8 (eight) hours as needed for nausea or vomiting.  Marland Kitchen OVER THE COUNTER MEDICATION Premeir protein drink once a day  . pantoprazole (PROTONIX) 40 MG tablet Take 1 tablet (40 mg total) by mouth 2 (two) times daily before a meal.  . PROAIR HFA 108 (90 Base) MCG/ACT inhaler INHALE 2 PUFFS INTO THE LUNGS EVERY 6 HOURS AS NEEDED FOR WHEEZING OR SHORTNESS OF BREATH  . traMADol (ULTRAM) 50 MG tablet Take 1 tablet (50 mg total) by mouth every 6 (six) hours as needed.  . ursodiol (ACTIGALL) 500 MG tablet Take 1 tablet (500 mg total) by mouth 2 (two) times daily.     Allergies:   Penicillins and Lidocaine   ROS:    Please see the history of present illness.     All other systems reviewed and are negative.   Labs/Other Tests and Data Reviewed:    Recent Labs: 02/23/2020: B Natriuretic Peptide 54.0 02/24/2020: TSH 2.958 02/26/2020: Magnesium 2.1 08/12/2020: ALT 19; BUN 9; Creatinine, Ser 0.62; Hemoglobin 13.8; Platelets 408; Potassium 3.9; Sodium 138   Recent Lipid Panel Lab Results  Component Value Date/Time   CHOL 166 01/27/2017 12:05 PM   TRIG 103 01/27/2017 12:05 PM   HDL 45 (L) 01/27/2017 12:05 PM   CHOLHDL 3.7 01/27/2017 12:05 PM   LDLCALC 101 (H) 01/27/2017 12:05 PM    Wt Readings from Last 3 Encounters:  08/15/20 139 lb 12.8 oz (63.4 kg)  08/07/20 138 lb 3.2 oz (62.7 kg)  07/16/20 142 lb (64.4 kg)      ASSESSMENT & PLAN:    Allergic rhinitis/sinusitis On Claritin Flonase Added Azelastine nasal spray Advised to use Humidifier  Time:   Today, I have spent 9 minutes reviewing the chart, including problem list, medications,  and with the patient with telehealth technology discussing the above problems.   Medication Adjustments/Labs and Tests Ordered: Current medicines are reviewed at length with the patient today.  Concerns regarding medicines are outlined above.   Tests Ordered: No orders of the defined types were placed in this encounter.   Medication Changes: Meds ordered this encounter  Medications  . azelastine (ASTELIN) 0.1 % nasal spray    Sig: Place 1 spray into both nostrils 2 (two) times daily. Use in each nostril as directed    Dispense:  30 mL    Refill:  12     Note: This dictation was prepared with Dragon dictation along with smaller phrase technology. Similar sounding words can be transcribed inadequately or may not be corrected upon review. Any transcriptional errors that result from this process are unintentional.      Disposition:  Follow up  Signed, Anabel Halon, MD  08/28/2020 8:27 AM     [facility]

## 2020-08-28 NOTE — Patient Instructions (Signed)
Allergic Rhinitis, Adult Allergic rhinitis is a reaction to allergens. Allergens are things that can cause an allergic reaction. This condition affects the lining inside the nose (mucous membrane). There are two types of allergic rhinitis:  Seasonal. This type is also called hay fever. It happens only during some times of the year.  Perennial. This type can happen at any time of the year. This condition cannot be spread from person to person (is not contagious). It can be mild, worse, or very bad. It can develop at any age and may be outgrown. What are the causes? This condition may be caused by:  Pollen from grasses, trees, and weeds.  Dust mites.  Smoke.  Mold.  Car fumes.  The pee (urine), spit, or dander of pets. Dander is dead skin cells from a pet.   What increases the risk? You are more likely to develop this condition if:  You have allergies in your family.  You have problems like allergies in your family. You may have: ? Swelling of parts of your eyes and eyelids. ? Asthma. This affects how you breathe. ? Long-term redness and swelling on your skin. ? Food allergies. What are the signs or symptoms? The main symptom of this condition is a runny or stuffy nose (nasal congestion). Other symptoms may include:  Sneezing or coughing.  Itching and tearing of your eyes.  Mucus that drips down the back of your throat (postnasal drip).  Trouble sleeping.  Feeling tired.  Headache.  Sore throat. How is this treated? There is no cure for this condition. You should avoid things that you are allergic to. Treatment can help to relieve symptoms. This may include:  Medicines that block allergy symptoms, such as corticosteroids or antihistamines. These may be given as a shot, nasal spray, or pill.  Avoiding things you are allergic to.  Medicines that give you bits of what you are allergic to over time. This is called immunotherapy. It is done if other treatments do not  help. You may get: ? Shots. ? Medicine under your tongue.  Stronger medicines, if other treatments do not help. Follow these instructions at home: Avoiding allergens Find out what things you are allergic to and avoid them. To do this, try these things:  If you get allergies any time of year: ? Replace carpet with wood, tile, or vinyl flooring. Carpet can trap pet dander and dust. ? Do not smoke. Do not allow smoking in your home. ? Change your heating and air conditioning filters at least once a month.  If you get allergies only some times of the year: ? Keep windows closed when you can. ? Plan things to do outside when pollen counts are lowest. Check pollen counts before you plan things to do outside. ? When you come indoors, change your clothes and shower before you sit on furniture or bedding.   If you are allergic to a pet: ? Keep the pet out of your bedroom. ? Vacuum, sweep, and dust often.   General instructions  Take over-the-counter and prescription medicines only as told by your doctor.  Drink enough fluid to keep your pee (urine) pale yellow.  Keep all follow-up visits as told by your doctor. This is important. Where to find more information  American Academy of Allergy, Asthma & Immunology: www.aaaai.org Contact a doctor if:  You have a fever.  You get a cough that does not go away.  You make whistling sounds when you breathe (wheeze).  Your   symptoms slow you down.  Your symptoms stop you from doing your normal things each day. Get help right away if:  You are short of breath. This symptom may be an emergency. Do not wait to see if the symptom will go away. Get medical help right away. Call your local emergency services (911 in the [place] Do not drive yourself to the hospital. Summary  Allergic rhinitis may be treated by taking medicines and avoiding things you are allergic to.  If you have allergies only some of the year, keep windows closed when you  can at those times.  Contact your doctor if you get a fever or a cough that does not go away. This information is not intended to replace advice given to you by your health care provider. Make sure you discuss any questions you have with your health care provider. Document Revised: 05/15/2019 Document Reviewed: 03/21/2019 Elsevier Patient Education  2021 [employer]vier Inc.  

## 2020-09-03 ENCOUNTER — Other Ambulatory Visit: Payer: Self-pay

## 2020-09-03 ENCOUNTER — Ambulatory Visit (INDEPENDENT_AMBULATORY_CARE_PROVIDER_SITE_OTHER): Payer: Medicaid Other | Admitting: Neurology

## 2020-09-03 DIAGNOSIS — R202 Paresthesia of skin: Secondary | ICD-10-CM | POA: Diagnosis not present

## 2020-09-03 DIAGNOSIS — R29898 Other symptoms and signs involving the musculoskeletal system: Secondary | ICD-10-CM

## 2020-09-03 NOTE — Procedures (Signed)
[facility] Neurology  [address] [place], [address]  [place], [place] [address] Tel: [phone] Fax:  [phone] Test Date:  09/03/2020  Patient: Kara Stewart DOB: 1975/04/28 Physician: Nita Sickle, DO  Sex: Female Height: 5\' 3"  Ref Phys: , D.O.  ID#: Shon Millet   Technician:    Patient Complaints: This is a 45 year old female referred for evaluation of bilateral leg weakness.  NCV & EMG Findings: Extensive electrodiagnostic testing of the right lower extremity and additional studies of the left shows:  1. Bilateral sural and superficial peroneal sensory responses are within normal limits. 2. Left peroneal motor response shows reduced amplitude (2.5 mV) at the extensor digitorum brevis, and is normal at the tibialis anterior.  Right peroneal and bilateral tibial motor responses are within normal limits. 3. Bilateral tibial H reflex studies are within normal limits. 4. There is an incomplete pattern of motor unit activation as seen by a variable recruitment pattern in bilateral medial gastrocnemius muscles.  The remaining tested muscles of the lower extremities showed normal motor unit configuration and recruitment pattern.  Proximal and deep muscles were not tested as the patient is on anticoagulation therapy.  Impression: 1. Bilateral medial gastrocnemius muscles show incomplete motor unit activation.  These findings may be due to poor effort, pain, or central disorder of motor unit control.  Correlate clinically. 2. There is no evidence of a diffuse myopathy, sensorimotor polyneuropathy, or lumbosacral radiculopathy.   ___________________________ 59, DO    Nerve Conduction Studies Anti Sensory Summary Table   Stim Site NR Peak (ms) Norm Peak (ms) P-T Amp (V) Norm P-T Amp  Left Sup Peroneal Anti Sensory (Ant Lat Mall)  34C  12 cm    2.5 <4.5 25.2 >5  Right Sup Peroneal Anti Sensory (Ant Lat Mall)  34C  12 cm    2.4 <4.5 23.3 >5  Left Sural Anti  Sensory (Lat Mall)  34C  Calf    2.3 <4.5 26.0 >5  Right Sural Anti Sensory (Lat Mall)  34C  Site 2    2.2  22.2    Motor Summary Table   Stim Site NR Onset (ms) Norm Onset (ms) O-P Amp (mV) Norm O-P Amp Site1 Site2 Delta-0 (ms) Dist (cm) Vel (m/s) Norm Vel (m/s)  Left Peroneal Motor (Ext Dig Brev)  34C  Ankle    3.8 <5.5 2.5 >3 B Fib Ankle 7.1 35.0 49 >40  B Fib    10.9  2.2  Poplt B Fib 1.4 8.0 57 >40  Poplt    12.3  2.1         Right Peroneal Motor (Ext Dig Brev)  34C  Ankle    2.7 <5.5 5.5 >3 B Fib Ankle 7.1 38.0 54 >40  B Fib    9.8  5.2  Poplt B Fib 1.5 8.0 53 >40  Poplt    11.3  5.1         Left Peroneal TA Motor (Tib Ant)  34C  Fib Head    2.7 <4.0 5.1 >4 Poplit Fib Head 1.4 8.0 57 >40  Poplit    4.1  5.0         Left Tibial Motor (Abd Hall Brev)  34C  Ankle    4.5 <6.0 12.5 >8 Knee Ankle 6.9 39.0 57 >40  Knee    11.4  11.2         Right Tibial Motor (Abd Hall Brev)  34C  Ankle    2.8 <6.0 9.6 >  8 Knee Ankle 7.4 40.0 54 >40  Knee    10.2  9.6          H Reflex Studies   NR H-Lat (ms) Lat Norm (ms) L-R H-Lat (ms)  Left Tibial (Gastroc)  34C     32.38 <35 1.09  Right Tibial (Gastroc)  34C     31.29 <35 1.09   EMG   Side Muscle Ins Act Fibs Psw Fasc Number Recrt Dur Dur. Amp Amp. Poly Poly. Comment  Right AntTibialis Nml Nml Nml Nml Nml Nml Nml Nml Nml Nml Nml Nml N/A  Right Gastroc Nml Nml Nml Nml 3- Mod-V Nml Nml Nml Nml Nml Nml N/A  Right RectFemoris Nml Nml Nml Nml Nml Nml Nml Nml Nml Nml Nml Nml N/A  Right BicepsFemS Nml Nml Nml Nml Nml Nml Nml Nml Nml Nml Nml Nml N/A  Left BicepsFemS Nml Nml Nml Nml Nml Nml Nml Nml Nml Nml Nml Nml N/A  Left AntTibialis Nml Nml Nml Nml Nml Nml Nml Nml Nml Nml Nml Nml N/A  Left Gastroc Nml Nml Nml Nml 3- Mod-V Nml Nml Nml Nml Nml Nml N/A  Left RectFemoris Nml Nml Nml Nml Nml Nml Nml Nml Nml Nml Nml Nml N/A      Waveforms:

## 2020-09-03 NOTE — Telephone Encounter (Signed)
Patient returning call.

## 2020-09-04 NOTE — Telephone Encounter (Signed)
Patient advised,

## 2020-09-06 ENCOUNTER — Other Ambulatory Visit: Payer: Self-pay

## 2020-09-06 ENCOUNTER — Encounter (HOSPITAL_COMMUNITY): Payer: Self-pay | Admitting: Physician Assistant

## 2020-09-06 ENCOUNTER — Inpatient Hospital Stay (HOSPITAL_COMMUNITY): Payer: Medicaid Other | Attending: Hematology | Admitting: Physician Assistant

## 2020-09-06 VITALS — BP 92/62 | HR 82 | Temp 97.7°F | Resp 18 | Wt 135.5 lb

## 2020-09-06 DIAGNOSIS — E538 Deficiency of other specified B group vitamins: Secondary | ICD-10-CM | POA: Diagnosis not present

## 2020-09-06 DIAGNOSIS — Z86718 Personal history of other venous thrombosis and embolism: Secondary | ICD-10-CM | POA: Insufficient documentation

## 2020-09-06 DIAGNOSIS — I83811 Varicose veins of right lower extremities with pain: Secondary | ICD-10-CM | POA: Diagnosis not present

## 2020-09-06 DIAGNOSIS — K743 Primary biliary cirrhosis: Secondary | ICD-10-CM | POA: Insufficient documentation

## 2020-09-06 DIAGNOSIS — E559 Vitamin D deficiency, unspecified: Secondary | ICD-10-CM

## 2020-09-06 DIAGNOSIS — Z79899 Other long term (current) drug therapy: Secondary | ICD-10-CM | POA: Diagnosis not present

## 2020-09-06 DIAGNOSIS — D509 Iron deficiency anemia, unspecified: Secondary | ICD-10-CM | POA: Diagnosis not present

## 2020-09-06 DIAGNOSIS — Z7901 Long term (current) use of anticoagulants: Secondary | ICD-10-CM | POA: Insufficient documentation

## 2020-09-06 MED ORDER — VITAMIN D 25 MCG (1000 UNIT) PO TABS: 1000.0000 [IU] | ORAL_TABLET | Freq: Every day | ORAL | 11 refills | Status: DC

## 2020-09-06 NOTE — Patient Instructions (Signed)
[facility] at [facility] Discharge Instructions  You were seen today by Rojelio Brenner PA-C for your anemia and history of portal vein blood clot.  Other than low Vitamin D, your labs and blood counts looked good.  LABS: Return in 3 months for labs   OTHER:  Referral to vascular surgery specialists due to right leg varicose veins  MEDICATIONS: START taking Vitamin D3 (cholecalciferol) 25 mcg (1,000 units) daily  FOLLOW-UP APPOINTMENT: Return in 3 months.   Thank you for choosing [facility] at [facility] to provide your oncology and hematology care.  To afford each patient quality time with our provider, please arrive at least 15 minutes before your scheduled appointment time.   If you have a lab appointment with the Cancer Center please come in thru the Main Entrance and check in at the main information desk.  You need to re-schedule your appointment should you arrive 10 or more minutes late.  We strive to give you quality time with our providers, and arriving late affects you and other patients whose appointments are after yours.  Also, if you no show three or more times for appointments you may be dismissed from the clinic at the providers discretion.     Again, thank you for choosing [facility].  Our hope is that these requests will decrease the amount of time that you wait before being seen by our physicians.       _____________________________________________________________  Should you have questions after your visit to [facility], please contact our office at [phone] and follow the prompts.  Our office hours are 8:00 a.m. and 4:30 p.m. Monday - Friday.  Please note that voicemails left after 4:00 p.m. may not be returned until the following business day.  We are closed weekends and major holidays.  You do have access to a nurse 24-7, just call the main number to the clinic [phone] and do not press  any options, hold on the line and a nurse will answer the phone.    For prescription refill requests, have your pharmacy contact our office and allow 72 hours.    Due to Covid, you will need to wear a mask upon entering the hospital. If you do not have a mask, a mask will be given to you at the Main Entrance upon arrival. For doctor visits, patients may have 1 support person age 3 or older with them. For treatment visits, patients can not have anyone with them due to social distancing guidelines and our immunocompromised population.

## 2020-09-06 NOTE — Progress Notes (Signed)
[facility] 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 45 y.o. 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  She was last seen by NP Durenda Hurt on 05/07/2020.  She reports that she is doing fair.  She does have multiple chronic diseases causing ongoing symptoms in her life, but in general she reports that she is stable and has not changed from her baseline.  She continues to take Eliquis and has not missed any doses since her last visit.  She denies any signs or symptoms of abnormal bleeding - no epistaxis, hemoptysis, hematemesis, hematochezia, melena, hematuria.  HOWEVER, she notes that her menstrual periods are much heavier than they used to be ever since she started Eliquis.  She feels fatigued at today's visit, reports that her energy level is 25%.  She notes that her fatigue did improve somewhat after her IV iron infusion, but that her energy level has been slowly decreasing over the past month.  She is no longer taking ferrous sulfate due to GI symptoms.  She continues her B12 injection monthly, as well as multivitamin with folic acid, and calcium/vitamin D.  She is concerned about right posterior thigh pain, spasming, and remittent swelling - worried that she might have a blood clot.  She describes a warm knot in her leg that will "pop" and result in a brusie the next day.  She has been having the symptoms for the past several months, she reports that they are progressive.  Venous duplex was obtained on 08/15/2020, which was negative for right  lower extremity DVT but did show subcutaneous venous varicosities in the right posterior thigh.  She has 25% energy and 50% appetite. She has been slowly losing weight since the diagnosis of her multiple chronic diseases, which she attributes to her abdominal complaints and poor appetite.    REVIEW OF SYSTEMS:  Review of Systems  Constitutional: Positive for appetite change (50%) and fatigue (25%). Negative for chills, diaphoresis, fever and unexpected weight change.  HENT:   Negative for lump/mass and nosebleeds.   Eyes: Negative for eye problems.  Respiratory: Positive for cough (Chronic) and shortness of breath (Chronic). Negative for hemoptysis.   Cardiovascular: Negative for chest pain, leg swelling and palpitations.  Gastrointestinal: Positive for abdominal pain (Chronic, diffuse but especially noted in left upper quadrant), constipation and diarrhea. Negative for blood in stool, nausea and vomiting.  Genitourinary: Negative for hematuria.   Skin: Positive for itching.  Neurological: Positive for dizziness, headaches and numbness (Peripheral neuropathy). Negative for light-headedness.  Hematological: Does not bruise/bleed easily.  Psychiatric/Behavioral: Positive for sleep disturbance. The patient is nervous/anxious.       PAST MEDICAL/SURGICAL HISTORY:  Past Medical History:  Diagnosis Date  . Acid reflux   . 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 Alcoholic hepatitis without ascites   . Anxiety   . Asthma   . Closed fracture of wrist 03/06/2020  . Colitis   . GERD (gastroesophageal reflux disease) 07/21/2012  . Hiatal hernia   . History of alcohol abuse 04/26/2015  . History of colitis 11/29/2014  . Hypertriglyceridemia   . Hypothyroid   . Iritis  frequent  . Ovarian cyst   . Panic attacks   . SVT (supraventricular tachycardia) (HCC)    last issue 12/2017   Past Surgical History:  Procedure Laterality Date  . BIOPSY N/A 02/05/2015   Procedure: BIOPSY;  Surgeon: West Bali, MD;  Location: [facility];  Service: Endoscopy;  Laterality: N/A;  . COLONOSCOPY    . COLONOSCOPY WITH PROPOFOL N/A 02/05/2015   ZOX:[id number] HH/mild diverticulosis  . ESOPHAGEAL DILATION N/A 11/21/2015   Procedure: ESOPHAGEAL DILATION;  Surgeon: Corbin Ade, MD;  Location: [facility];  Service: Endoscopy;  Laterality: N/A;  . ESOPHAGOGASTRODUODENOSCOPY  11/06/2011   [name]: MILD Esophagitis/PATENT ESOPHAGEAL Stricture/  Moderate gastritis. Bx no.hpylori or celiac, +gastritis  . ESOPHAGOGASTRODUODENOSCOPY (EGD) WITH PROPOFOL N/A 03/20/2014   [name]: 1. Mild esophagitis & distal esophagela stricture. 2. small hiatal hernia 3. moderate non-erosive gastritis and mild duodenits  . ESOPHAGOGASTRODUODENOSCOPY (EGD) WITH PROPOFOL N/A 11/21/2015   Dr. Jena Gauss: LA grade B esophagitis, MW tear likely source of hematemesis  . LAPAROSCOPIC TUBAL LIGATION Bilateral 09/14/2018   Procedure: LAPAROSCOPIC TUBAL LIGATION;  Surgeon: Allie Bossier, MD;  Location: [facility];  Service: Gynecology;  Laterality: Bilateral;  . LIVER BIOPSY  02/23/2020   Procedure: OPEN LIVER BIOPSY;  Surgeon: Fritzi Mandes, MD;  Location: [facility] OR;  Service: General;;  . Gaspar Bidding DILATION N/A 03/20/2014   Procedure: SAVORY DILATION;  Surgeon: West Bali, MD;  Location: [facility];  Service: Endoscopy;  Laterality: N/A;  dilated with # 12.8, 14,15,16  . SPLENECTOMY, TOTAL N/A 02/23/2020   Procedure: SPLENECTOMY;  Surgeon: Fritzi Mandes, MD;  Location: [facility] OR;  Service: General;  Laterality: N/A;  . TOOTH EXTRACTION       SOCIAL HISTORY:  Social History   Socioeconomic History  . Marital status: Divorced    Spouse name: Not on file  . Number of children: 0  . Years of education: Not on file  . Highest education level: Not on file  Occupational History  . Occupation: was at [employer] but not working now  Tobacco Use  . Smoking status: Current Some Day Smoker    Types: Cigarettes    Last attempt to quit: 12/22/2017    Years  since quitting: 2.7  . Smokeless tobacco: Never Used  . Tobacco comment: "might smoke one on a bad day"  Vaping Use  . Vaping Use: Never used  Substance and Sexual Activity  . Alcohol use: No    Alcohol/week: 0.0 standard drinks    Comment: Sober since 2017  . Drug use: Never  . Sexual activity: Yes    Birth control/protection: Condom  Other Topics Concern  . Not on file  Social History Narrative   Lives alone with a roommate. Dating and in safe relationship. Does not smoke. Denies drug use.    Previous MD: Phill Mutter, NP ([place], [place])   Right handed   Drinks caffeine   One story home   Social Determinants of Health   Financial Resource Strain: High Risk  . Difficulty of Paying Living Expenses: Hard  Food Insecurity: No Food Insecurity  . Worried About Programme researcher, broadcasting/film/video in the Last Year: Never true  . Ran Out of Food in the Last Year: Never true  Transportation Needs: No Transportation Needs  . Lack of Transportation (Medical): No  . Lack of Transportation (Non-Medical): No  Physical Activity: Inactive  . Days of Exercise per Week: 0 days  . Minutes of Exercise per  Session: 0 min  Stress: Stress Concern Present  . Feeling of Stress : Rather much  Social Connections: Moderately Isolated  . Frequency of Communication with Friends and Family: More than three times a week  . Frequency of Social Gatherings with Friends and Family: Twice a week  . Attends Religious Services: More than 4 times per year  . Active Member of Clubs or Organizations: No  . Attends Banker Meetings: Never  . Marital Status: Divorced  Catering manager Violence: Not At Risk  . Fear of Current or Ex-Partner: No  . Emotionally Abused: No  . Physically Abused: No  . Sexually Abused: No    FAMILY HISTORY:  Family History  Problem Relation Age of Onset  . Stomach cancer Paternal Grandfather        colon cancer  . Cancer Paternal Grandfather        throat and esophagus  . Breast  cancer Maternal Grandmother   . Cancer Maternal Grandmother        skin  . Anxiety disorder Maternal Grandmother   . Hypertension Mother   . Other Mother        fatty liver  . Hyperlipidemia Mother   . Liver disease Mother        fatty liver, does not drink.   . Sleep apnea Mother   . Other Father        varicose veins; stomach issues; hernia  . Hypertension Father   . Hyperlipidemia Father   . Cancer Father        prostate  . Arthritis Father        rheumatoid  . Neuropathy Father   . Rheum arthritis Father   . Thyroid disease Sister   . Hypertension Sister   . Other Brother        hernia  . Diabetes Maternal Grandfather   . Heart disease Maternal Grandfather   . Other Paternal Grandmother        hernia  . COPD Paternal Grandmother   . Diabetes Paternal Grandmother   . Healthy Daughter     CURRENT MEDICATIONS:  Outpatient Encounter Medications as of 09/06/2020  Medication Sig Note  . acetaminophen (TYLENOL) 325 MG tablet Take 2 tablets (650 mg total) by mouth every 6 (six) hours as needed.   Marland Kitchen apixaban (ELIQUIS) 5 MG TABS tablet Take 1 tablet (5 mg total) by mouth 2 (two) times daily.   Marland Kitchen azelastine (ASTELIN) 0.1 % nasal spray Place 1 spray into both nostrils 2 (two) times daily. Use in each nostril as directed   . Calcium Carb-Cholecalciferol (CALCIUM/VITAMIN D PO) Take by mouth daily.   . cyanocobalamin (,VITAMIN B-12,) 1000 MCG/ML injection Inject 1 mL (1,000 mcg total) into the muscle every 30 (thirty) days.   . Cyanocobalamin (VITAMIN B-12 IJ) Inject as directed every 30 (thirty) days. 02/23/2020: Lf: 02/09/20 DS:30  . fluticasone (FLOVENT HFA) 44 MCG/ACT inhaler Inhale 2 puffs into the lungs 2 (two) times daily.   Marland Kitchen gabapentin (NEURONTIN) 100 MG capsule Take 1 capsule at bedtime for a week, then increase to 2 capsules at bedtime   . loratadine (CLARITIN) 10 MG tablet Take 10 mg by mouth daily.   . methocarbamol (ROBAXIN) 500 MG tablet TAKE 1 TABLET BY MOUTH FOUR  TIMES DAILY AS NEEDED   . MILK THISTLE PO Take by mouth daily.   Marland Kitchen OVER THE COUNTER MEDICATION Premeir protein drink once a day   . pantoprazole (PROTONIX) 40 MG tablet Take 1  tablet (40 mg total) by mouth 2 (two) times daily before a meal.   . PROAIR HFA 108 (90 Base) MCG/ACT inhaler INHALE 2 PUFFS INTO THE LUNGS EVERY 6 HOURS AS NEEDED FOR WHEEZING OR SHORTNESS OF BREATH   . traMADol (ULTRAM) 50 MG tablet Take 1 tablet (50 mg total) by mouth every 6 (six) hours as needed.   . ursodiol (ACTIGALL) 500 MG tablet Take 1 tablet (500 mg total) by mouth 2 (two) times daily.   . fluticasone (FLONASE) 50 MCG/ACT nasal spray Place 2 sprays into both nostrils daily. (Patient not taking: Reported on 09/06/2020)   . ondansetron (ZOFRAN-ODT) 4 MG disintegrating tablet Take 1 tablet (4 mg total) by mouth every 8 (eight) hours as needed for nausea or vomiting. (Patient not taking: Reported on 09/06/2020)    No facility-administered encounter medications on file as of 09/06/2020.    ALLERGIES:  Allergies  Allergen Reactions  . Penicillins Anaphylaxis    Has patient had a PCN reaction causing immediate rash, facial/tongue/throat swelling, SOB or lightheadedness with hypotension: yes Has patient had a PCN reaction causing severe rash involving mucus membranes or skin necrosis: No Has patient had a PCN reaction that required hospitalization Yes Has patient had a PCN reaction occurring within the last 10 years: No If all of the above answers are "NO", then may proceed with Cephalosporin use.   . Lidocaine Other (See Comments)    Can use topical lidocaine but if ingested causes Hallucinations.     PHYSICAL EXAM:  ECOG PERFORMANCE STATUS: 2 - Symptomatic, <50% confined to bed  Vitals:   09/06/20 0842  BP: 92/62  Pulse: 82  Resp: 18  Temp: 97.7 F (36.5 C)  SpO2: 100%   Filed Weights   09/06/20 0842  Weight: 135 lb 8 oz (61.5 kg)   Physical Exam Constitutional:      Appearance: Normal appearance.      Comments: Appears fatigued  HENT:     Head: Normocephalic and atraumatic.     Mouth/Throat:     Mouth: Mucous membranes are moist.  Eyes:     Extraocular Movements: Extraocular movements intact.     Pupils: Pupils are equal, round, and reactive to light.  Cardiovascular:     Rate and Rhythm: Normal rate and regular rhythm.     Pulses: Normal pulses.     Heart sounds: Normal heart sounds.  Pulmonary:     Effort: Pulmonary effort is normal.     Breath sounds: Normal breath sounds.  Abdominal:     General: Bowel sounds are normal.     Palpations: Abdomen is soft.     Tenderness: There is abdominal tenderness.  Musculoskeletal:        General: No swelling.     Right lower leg: No edema.     Left lower leg: No edema.  Lymphadenopathy:     Cervical: No cervical adenopathy.  Skin:    General: Skin is warm and dry.     Comments: Varicose veins with palpable nodules noted on patient's right posterior thigh  Neurological:     General: No focal deficit present.     Mental Status: She is alert and oriented to person, place, and time.  Psychiatric:        Mood and Affect: Mood normal.        Behavior: Behavior normal.      LABORATORY DATA:  I have reviewed the labs as listed.  CBC    Component Value Date/Time  WBC 11.3 (H) 08/12/2020 1330   RBC 4.51 08/12/2020 1330   HGB 13.8 08/12/2020 1330   HGB 11.8 01/17/2018 1355   HCT 42.3 08/12/2020 1330   HCT 40.8 07/24/2019 0938   PLT 408 (H) 08/12/2020 1330   PLT 114 (L) 01/17/2018 1355   MCV 93.8 08/12/2020 1330   MCV 88 01/17/2018 1355   MCH 30.6 08/12/2020 1330   MCHC 32.6 08/12/2020 1330   RDW 13.5 08/12/2020 1330   RDW 13.3 01/17/2018 1355   LYMPHSABS 4.3 (H) 08/12/2020 1330   LYMPHSABS 1.1 01/17/2018 1355   MONOABS 0.8 08/12/2020 1330   EOSABS 0.4 08/12/2020 1330   EOSABS 0.1 01/17/2018 1355   BASOSABS 0.1 08/12/2020 1330   BASOSABS 0.0 01/17/2018 1355   CMP Latest Ref Rng & Units 08/12/2020 05/02/2020 03/15/2020   Glucose 70 - 99 mg/dL 703(J) 009(F) 90  BUN 6 - 20 mg/dL 9 7 6   Creatinine 0.44 - 1.00 mg/dL 8.18 2.99  Sodium 135 - 145 mmol/L 138 136 137  Potassium 3.5 - 5.1 mmol/L 3.9 3.8 3.4(L)  Chloride 98 - 111 mmol/L 105 106 103  CO2 22 - 32 mmol/L 27 25 24   Calcium 8.9 - 10.3 mg/dL 9.4 9.5 9.2  Total Protein 6.5 - 8.1 g/dL 6.9 7.3 6.9  Total Bilirubin 0.3 - 1.2 mg/dL 0.7 0.6 0.5  Alkaline Phos 38 - 126 U/L 71 77 100  AST 15 - 41 U/L 20 21 26   ALT 0 - 44 U/L 19 15 17     DIAGNOSTIC IMAGING:  I have independently reviewed the relevant imaging and discussed with the patient.   ASSESSMENT & PLAN: 1. Portal vein thrombosis - Due November 2021, patient fell and was found to have splenic rupture with thrombus in the main portal vein and central intrahepatic vein and branches, with edema around the pancreas, as well as fatty liver with early cirrhosis.  She had splenectomy as well as liver biopsy that showed steatohepatitis.  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 - Hypercoagulable work-up was negative - She is stable on Eliquis, reports that she has not missed any doses since her last appointment - Denies easy bruising.  Complains of menorrhagia, but denies other abnormal signs or symptoms of bleeding. - PLAN: Continue Eliquis indefinitely.    2. Iron deficiency anemia -She was unable to tolerate oral iron tablets, but received IV Feraheme on 05/14/2020 and 05/21/2020 - She has heavy blood loss secondary to menorrhagia, but denies any signs of GI blood loss - Her significant fatigue is multifactorial - Most recent labs (08/12/2020) show WBC 11.3, Hgb 13.8, platelets 408; ferritin at goal (122), iron saturation 37% - PLAN:  No indication for IV iron at this time.  Recommended her to speak with her gynecologist about options for her menorrhagia, such as possible intrauterine ablation.  Repeat CBC and iron panel in 3 months.  3.  Leukocytosis and  thrombocytosis - Most recent labs (08/12/2020) showed WBC 11.3 and platelets 408 - Likely secondary to splenectomy - PLAN: Repeat CBC in 3 months.  Consider further work-up (such as JAK2, flow cytometry, BCR/ABL) if there is significant deviation from baseline.  4. Right leg varicose veins - Symptomatic varicose veins of right thigh - Patient was concerned about blood clot, but venous duplex on 08/15/2020 was negative for right lower extremity DVT - PLAN: Referral sent to vascular surgery for further work-up and treatment.  5.  Folate deficiency: - Labs (08/12/2020): Normal folate 9.6 - PLAN:  Continue daily multivitamin with folic acid.  Repeat folic acid in 3 months.  6.  B12 deficiency: -Labs (08/12/2020): B12 normal at 568 - PLAN: Continue monthly B12 injections at home.    Repeat B12 and methylmalonic acid in 3 months.  7.  Vitamin D deficiency - Labs (08/12/2020): Vitamin D low at 26.78 - Patient reports that she takes calcium 500 mg/vitamin D3 400 units at home - PLAN: Prescription sent for additional vitamin D3 (cholecalciferol) 1000 units daily.  We will repeat vitamin D in 3 months.  8.    Primary biliary cirrhosis and chronic abdominal pain: - Chronic and multifactorial secondary to primary biliary cirrhosis and splenectomy with postoperative changes (small irregular collection of left upper quadrant hematoma or seroma) - Follows with gastroenterology, and is on ursodiol - PLAN:  Continue follow-up with gastroenterology.     PLAN SUMMARY & DISPOSITION: - Start taking cholecalciferol (vitamin D3) 1000 units daily - Referral to vascular surgeons due to varicose veins - Continue multivitamin/folic acid.  Continue vitamin B12 injections at home every month. - Continue Eliquis - Continue gastroenterology follow-up - Advised to speak with gynecology regarding menorrhagia - Repeat labs and RTC in 3 months  All questions were answered. The patient knows to call the clinic with any  problems, questions or concerns.  Medical decision making: Moderate (2+ chronic problems, review of previous results, ordering future tests)  Time spent on visit: I spent 30 minutes counseling the patient face to face. The total time spent in the appointment was 40 minutes and more than 50% was on counseling.   Carnella GuadalajaraRebekah M Francille Wittmann, PA-C  09/06/20 9:04 AM

## 2020-09-09 ENCOUNTER — Other Ambulatory Visit: Payer: Self-pay | Admitting: Internal Medicine

## 2020-09-10 ENCOUNTER — Other Ambulatory Visit: Payer: Self-pay | Admitting: Neurology

## 2020-09-10 ENCOUNTER — Ambulatory Visit: Payer: Medicaid Other | Admitting: Psychology

## 2020-09-10 ENCOUNTER — Other Ambulatory Visit: Payer: Self-pay

## 2020-09-10 ENCOUNTER — Ambulatory Visit (INDEPENDENT_AMBULATORY_CARE_PROVIDER_SITE_OTHER): Payer: Medicaid Other | Admitting: Psychology

## 2020-09-10 ENCOUNTER — Encounter: Payer: Self-pay | Admitting: Psychology

## 2020-09-10 DIAGNOSIS — F411 Generalized anxiety disorder: Secondary | ICD-10-CM

## 2020-09-10 DIAGNOSIS — R4189 Other symptoms and signs involving cognitive functions and awareness: Secondary | ICD-10-CM

## 2020-09-10 DIAGNOSIS — M797 Fibromyalgia: Secondary | ICD-10-CM | POA: Diagnosis not present

## 2020-09-10 DIAGNOSIS — F410 Panic disorder [episodic paroxysmal anxiety] without agoraphobia: Secondary | ICD-10-CM

## 2020-09-10 DIAGNOSIS — R413 Other amnesia: Secondary | ICD-10-CM | POA: Diagnosis not present

## 2020-09-10 DIAGNOSIS — F4310 Post-traumatic stress disorder, unspecified: Secondary | ICD-10-CM

## 2020-09-10 DIAGNOSIS — F332 Major depressive disorder, recurrent severe without psychotic features: Secondary | ICD-10-CM | POA: Diagnosis not present

## 2020-09-10 DIAGNOSIS — F329 Major depressive disorder, single episode, unspecified: Secondary | ICD-10-CM

## 2020-09-10 HISTORY — DX: Post-traumatic stress disorder, unspecified: F43.10

## 2020-09-10 HISTORY — DX: Major depressive disorder, single episode, unspecified: F32.9

## 2020-09-10 NOTE — Progress Notes (Addendum)
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 to perform well despite this, describing herself as well-rounded overall.  History of developmental delay: Denied. History of grade repetition: Denied. Enrollment in special education courses: Denied. History of LD/ADHD: Denied.  Employment: Unemployed. She reported being in the process of applying for disability benefits. She previously worked for the [employer], as well as a Pharmacologist and for [employer]   Evaluation Results:   Behavioral Observations: Ms. Strike was unaccompanied, arrived to her appointment on time, and was appropriately dressed and groomed. She appeared alert and oriented. She ambulated with the assistance of a cane and mild balance instability was observed. Gross motor functioning appeared intact upon informal observation and no abnormal movements (e.g., tremors) were noted. Her affect was somewhat anxious during interview. However, this did seem to improve and become more relaxed and positive as time progressed. Spontaneous speech was fluent. Very mild word finding difficulties were observed during interview. Thought processes were coherent, organized, and normal in  content. Insight into her cognitive difficulties appeared adequate. During testing, sustained attention was appropriate. Task engagement was adequate and she persisted when challenged. Overall, Ms. Mccullers was cooperative with the clinical interview and subsequent testing procedures.   Adequacy of Effort: The validity of neuropsychological testing is limited by the extent to which the individual being tested may be assumed to have exerted adequate effort during testing. Ms. Rashid expressed her intention to perform to the best of her abilities and exhibited adequate task engagement and persistence. Scores across stand-alone and embedded performance validity measures were variable. This is likely due to significant psychiatric distress rather than poor engagement or attempts to perform poorly. Overall, results should be interpreted with caution as lower scores have the potential to underestimate true cognitive abilities to an unknown degree.   Test Results: Ms. Leiner was generally oriented at the time of the current evaluation. She was one day off when stating the current day of the week.  Intellectual abilities based upon educational and vocational attainment were estimated to be in the average range. Premorbid  abilities were estimated to be within the upper limits of the below average range based upon a single-word reading test.   Processing speed was variable, ranging from the exceptionally low to average normative ranges. Basic attention was below average. More complex attention (e.g., working memory) was average. Executive functioning was variable, ranging from the well below average to average normative ranges.  While not directly assessed, receptive language abilities were believed to be intact. Ms. Maeda did not exhibit any difficulties comprehending task instructions and answered all questions asked of her appropriately. Assessed expressive language (e.g., verbal fluency and confrontation naming) was  below average to average.     Assessed visuospatial/visuoconstructional abilities were average. Points were lost on her drawing of a clock due to mild spatial abnormalities in numerical placement.   Fine motor coordination/speed was in the exceptionally low range when using her dominant (right) hand and in the well below average range when using her non-dominant hand.    Learning (i.e., encoding) of novel verbal and visual information was exceptionally low to well below average. Spontaneous delayed recall (i.e., retrieval) of previously learned information was also exceptionally low to well below average. Retention rates were 138% across a story learning task, 100% across a list learning task, and 83% across a shape learning task. Performance across recognition tasks was exceptionally low to well below average. However, this appeared to be due to Ms. Byrd responding "no" to all stimuli which she was not 100% sure rather than suspected deficits with information storage.    Results of emotional screening instruments suggested that recent symptoms of generalized anxiety were in the severe range, while symptoms of depression were also within the severe range. She elevated a trauma symptom questionnaire consistent with individuals who have an active diagnosis of PTSD. Across a more comprehensive personality assessment, she elevated clinical subscales surrounding somatic complaints, anxiety, anxiety-related disorders, depression, and stress. A screening instrument assessing recent sleep quality suggested the presence of moderate sleep dysfunction.  Tables of Scores:   Note: This summary of test scores accompanies the interpretive report and should not be considered in isolation without reference to the appropriate sections in the text. Descriptors are based on appropriate normative data and may be adjusted based on clinical judgment. The terms "impaired" and "within normal limits (WNL)" are used when a more  specific level of functioning cannot be determined.       Validity Testing:   DESCRIPTOR       Test of Memory Malingering:       Trial 1 --- --- Below Expectation    Trial 2 --- --- Within Expectation  ACS Word Choice: --- --- Below Expectation  NAB EVI: --- --- Within Expectation  D-KEFS Color Word Effort Index: --- --- Within Expectation       Orientation:      Raw Score Percentile   NAB Orientation, Form 1 28/29 --- ---       Cognitive Screening:           Raw Score Percentile   SLUMS: 17/30 --- ---       Intellectual Functioning:           Standard Score Percentile   Test of Premorbid Functioning: 89 23 Below Average       Memory:          NAB Memory Module, Form 1: T Score Percentile   List Learning       Total Trials 1-3 18/36 (29) 2 Exceptionally Low  List B 3/12 (32) 4 Well Below Average    Short Delay Free Recall 6/12 (32) 4 Well Below Average    Long Delay Free Recall 6/12 (33) 5 Well Below Average    Retention Percentage 100 (50) 50 Average    Recognition Discriminability 2 (31) 3 Well Below Average  Shape Learning       Total Trials 1-3 14/27 (35) 7 Well Below Average    Delayed Recall 5/9 (35) 7 Well Below Average    Retention Percentage 83 (43) 25 Average    Recognition Discriminability 4 (22) <1 Exceptionally Low  Story Learning       Immediate Recall 23/80 (19) <1 Exceptionally Low    Delayed Recall 18/40 (25) 1 Exceptionally Low    Retention Percentage 138 (94) >99 Exceptionally High  Daily Living Memory       Immediate Recall 37/51 (30) 2 Well Below Average    Delayed Recall 10/17 (19) <1 Exceptionally Low    Retention Percentage 63 (23) <1 Exceptionally Low    Recognition Hits 7/10 (27) 1 Exceptionally Low       Attention/Executive Function:          Trail Making Test (TMT): Raw Score (T Score) Percentile     Part A 52 secs.,  1 error (28) 2 Exceptionally Low    Part B 48 secs.,  0 errors (54) 66 Average         Scaled Score Percentile    WAIS-IV Coding: 10 50 Average       NAB Attention Module, Form 1: T Score Percentile     Digits Forward 42 21 Below Average    Digits Backwards 52 58 Average       D-KEFS Color-Word Interference Test: Raw Score (Scaled Score) Percentile     Color Naming 38 secs. (6) 9 Below Average    Word Reading 41 secs. (1) <1 Exceptionally Low    Inhibition 67 secs. (7) 16 Below Average      Total Errors 4 errors (6) 9 Below Average    Inhibition/Switching 75 secs. (8) 25 Average      Total Errors 7 errors (5) 5 Well Below Average       D-KEFS Verbal Fluency Test: Raw Score (Scaled Score) Percentile     Letter Total Correct 32 (8) 25 Average    Category Total Correct 31 (6) 9 Below Average    Category Switching Total Correct 12 (8) 25 Average    Category Switching Accuracy 11 (9) 37 Average      Total Set Loss Errors 0 (13) 84 Above Average      Total Repetition Errors 1 (11) 63 Average       Language:          NAB Language Module, Form 1: T Score Percentile     Naming 31/31 (53) 62 Average       Visuospatial/Visuoconstruction:      Raw Score Percentile   Clock Drawing: 9/10 --- Within Normal Limits       NAB Spatial Module, Form 1: T Score Percentile     Figure Drawing Copy 49 46 Average        Scaled Score Percentile   WAIS-IV Block Design: 9 37 Average       Sensory-Motor:          [place] Grooved Pegboard Test: Raw Score Percentile     Dominant Hand 87 secs.,  0 drops  1 Exceptionally Low    Non-Dominant  Hand 84 secs.,  0 drops  5 Well Below Average       Mood and Personality:      Raw Score Percentile   Beck Depression Inventory - II: 33 --- Severe  PROMIS Anxiety Questionnaire: 31 --- Severe  PTSD Checklist for DSM-5: 65 --- Above Threshold       Personality Assessment Inventory: T Score  Percentile     Inconsistency 43 --- Within Normal Limits    Infrequency 59 --- Within Normal Limits    Negative Impression 51 --- Within Normal Limits    Positive Impression 43  --- Within Normal Limits    Somatic Complaints 87 --- Elevated    Anxiety 82 --- Elevated    Anxiety-Related Disorders 84 --- Elevated    Depression 80 --- Elevated    Mania 46 --- Within Normal Limits    Paranoia 48 --- Within Normal Limits    Schizophrenia 63 --- Within Normal Limits    Borderline Features 57 --- Within Normal Limits    Antisocial Features 38 --- Within Normal Limits    Alcohol Problems 41 --- Within Normal Limits    Drug Problems 42 --- Within Normal Limits    Aggression 40 --- Within Normal Limits    Suicidal Ideation 43 --- Within Normal Limits    Stress 75 --- Elevated    Non Support 42 --- Within Normal Limits    Treatment Rejection 40 --- Within Normal Limits    Dominance 24 --- Within Normal Limits    Warmth 46 --- Within Normal Limits       Additional Questionnaires:      Raw Score Percentile   PROMIS Sleep Disturbance Questionnaire: 32 --- Moderate   Informed Consent and Coding/Compliance:   The current evaluation represents a clinical evaluation for the purposes previously outlined by the referral source and is in no way reflective of a forensic evaluation.   Ms. Wiersma was provided with a verbal description of the nature and purpose of the present neuropsychological evaluation. Also reviewed were the foreseeable risks and/or discomforts and benefits of the procedure, limits of confidentiality, and mandatory reporting requirements of this provider. The patient was given the opportunity to ask questions and receive answers about the evaluation. Oral consent to participate was provided by the patient.   This evaluation was conducted by Newman Nickels, Ph.D., licensed clinical neuropsychologist. Ms. Marandola completed a clinical interview with Dr. Milbert Coulter, billed as one unit [phone], and 195 minutes of cognitive testing and scoring, billed as one unit [phone] and six additional units [id number]. Psychometrist Wallace Keller, B.S., assisted Dr. Milbert Coulter with test administration  and scoring procedures. As a separate and discrete service, Dr. Milbert Coulter spent a total of 160 minutes in interpretation and report writing billed as one unit [phone] and two units [id number].

## 2020-09-10 NOTE — Progress Notes (Signed)
   Psychometrician Note   Cognitive testing was administered to [name] by Wallace Keller, B.S. (psychometrist) under the supervision of Dr. Newman Nickels, Ph.D., licensed psychologist on 09/10/20. Ms. Whitsel did not appear overtly distressed by the testing session per behavioral observation or responses across self-report questionnaires. Rest breaks were offered.    The battery of tests administered was selected by Dr. Newman Nickels, Ph.D. with consideration to Ms. Carlo's current level of functioning, the nature of her symptoms, emotional and behavioral responses during interview, level of literacy, observed level of motivation/effort, and the nature of the referral question. This battery was communicated to the psychometrist. Communication between Dr. Newman Nickels, Ph.D. and the psychometrist was ongoing throughout the evaluation and Dr. Newman Nickels, Ph.D. was immediately accessible at all times. Dr. Newman Nickels, Ph.D. provided supervision to the psychometrist on the date of this service to the extent necessary to assure the quality of all services provided.    Tari Lecount will return within approximately 1-2 weeks for an interactive feedback session with Dr. Milbert Coulter at which time her test performances, clinical impressions, and treatment recommendations will be reviewed in detail. Ms. Duffy understands she can contact our office should she require our assistance before this time.  A total of 195 minutes of billable time were spent face-to-face with Ms. Palomino by the psychometrist. This includes both test administration and scoring time. Billing for these services is reflected in the clinical report generated by Dr. Newman Nickels, Ph.D.  This note reflects time spent with the psychometrician and does not include test scores or any clinical interpretations made by Dr. Milbert Coulter. The full report will follow in a separate note.

## 2020-09-13 ENCOUNTER — Other Ambulatory Visit: Payer: Self-pay

## 2020-09-13 DIAGNOSIS — I8390 Asymptomatic varicose veins of unspecified lower extremity: Secondary | ICD-10-CM

## 2020-09-17 ENCOUNTER — Ambulatory Visit: Payer: Medicaid Other | Admitting: Neurology

## 2020-09-23 ENCOUNTER — Ambulatory Visit (INDEPENDENT_AMBULATORY_CARE_PROVIDER_SITE_OTHER): Payer: Medicaid Other | Admitting: Psychology

## 2020-09-23 ENCOUNTER — Other Ambulatory Visit: Payer: Self-pay

## 2020-09-23 DIAGNOSIS — F332 Major depressive disorder, recurrent severe without psychotic features: Secondary | ICD-10-CM | POA: Diagnosis not present

## 2020-09-23 DIAGNOSIS — M797 Fibromyalgia: Secondary | ICD-10-CM

## 2020-09-23 DIAGNOSIS — R413 Other amnesia: Secondary | ICD-10-CM | POA: Diagnosis not present

## 2020-09-23 DIAGNOSIS — F411 Generalized anxiety disorder: Secondary | ICD-10-CM | POA: Diagnosis not present

## 2020-09-23 DIAGNOSIS — F4310 Post-traumatic stress disorder, unspecified: Secondary | ICD-10-CM

## 2020-09-23 DIAGNOSIS — F410 Panic disorder [episodic paroxysmal anxiety] without agoraphobia: Secondary | ICD-10-CM

## 2020-09-23 NOTE — Progress Notes (Signed)
   Neuropsychology Feedback Session Kara Stewart. [facility] Department of Neurology  Reason for Referral:   Kara Stewart is a 45 y.o. mixed-handed (primarily right hand dominant but uses her left hand for numerous tasks) Caucasian female referred by Shon Millet, D.O., to characterize her current cognitive functioning and assist with diagnostic clarity and treatment planning in the context of subjective cognitive decline and numerous medical and psychiatric comorbidities.  Feedback:   Kara Stewart completed a comprehensive neuropsychological evaluation on 09/10/2020. Please refer to that encounter for the full report and recommendations. Briefly, responses across mood-related questionnaires suggested acute symptoms of severe anxiety and severe depression occurring during the past 1-2 weeks. In addition to this, across a trauma symptom questionnaire, she reported symptoms consistent with individuals who have an active diagnosis of post-traumatic stress disorder (PTSD). Moderate sleep dysfunction was also noted across this time frame. Overall, the most likely etiology of experienced cognitive dysfunction is psychiatric in nature, relating to the likely presence of PTSD, as well as severe symptoms of anxiety and depression. Dysfunction would also be worsened by her history of fibromyalgia, migraine headaches, and ongoing sleep dysfunction. Given her young age, the presence of a neurodegenerative disease process such as Alzheimer's disease would be exceptionally rare and patterns across testing do not support these concerns at the present time. Neuroimaging did not suggest any brain/spinal cord lesions to warrant present concern surrounding multiple sclerosis or another autoimmune condition. Prior EMG studies did not suggest concerns for ALS. Continued medical monitoring will be important moving forward.  Kara Stewart was unaccompanied during the current feedback session. Content of the current  session focused on the results of her neuropsychological evaluation. Kara Stewart was given the opportunity to ask questions and her questions were answered. She was encouraged to reach out should additional questions arise. A copy of her report was provided at the conclusion of the visit.      35 minutes were spent conducting the current feedback session with Kara Stewart, billed as one unit [id number].

## 2020-10-08 ENCOUNTER — Ambulatory Visit (HOSPITAL_COMMUNITY)
Admission: RE | Admit: 2020-10-08 | Discharge: 2020-10-08 | Disposition: A | Discharge status: Home / Self Care | Payer: Medicaid Other | Source: Ambulatory Visit | Attending: Vascular Surgery | Admitting: Vascular Surgery

## 2020-10-08 ENCOUNTER — Ambulatory Visit (INDEPENDENT_AMBULATORY_CARE_PROVIDER_SITE_OTHER): Payer: Medicaid Other | Admitting: Vascular Surgery

## 2020-10-08 ENCOUNTER — Other Ambulatory Visit: Payer: Self-pay

## 2020-10-08 ENCOUNTER — Encounter: Payer: Self-pay | Admitting: Vascular Surgery

## 2020-10-08 DIAGNOSIS — I8390 Asymptomatic varicose veins of unspecified lower extremity: Secondary | ICD-10-CM

## 2020-10-08 DIAGNOSIS — I872 Venous insufficiency (chronic) (peripheral): Secondary | ICD-10-CM | POA: Diagnosis not present

## 2020-10-08 NOTE — Progress Notes (Signed)
Patient name: Kara Stewart MRN: [id number] DOB: 1975/10/20 Sex: female  REASON FOR CONSULT: Discuss lower extremity varicose veins  HPI: Kara Stewart is a 45 y.o. female, with multi medical problems as listed below including history of liver disease with portal vein thrombosis on chronic anticoagulation who presents to discuss lower extremity varicose veins.  Patient states that she had a accident last year with a mechanical fall ultimately requiring a splenectomy.  In that timeframe sounds like she got worsening symptoms specifically in the right leg.  She describes an area on the lateral posterior right thigh where there is a notable varicosity that she states is painful and gets worse throughout the day.  She is wearing thigh-high compression that were purchased years ago.  She denies any history of lower extremity DVT but has had a portal vein thrombosis in the past.  She also feels she has some heaviness in both legs at end of day.  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 Treating with URSO due to elevated AMA. LFTs slowly improving. Concern for recurrent non-tense ascites on exam: patient feels uncomfortable and requesting paracentesis. This is likely   Asthma due to seasonal allergies 04/17/2020   Chronic liver disease 11/16/2017   Deep vein thrombosis of portal vein 03/06/2020   Dysphagia 05/29/2020   Fibromyalgia    Generalized anxiety disorder with panic attacks    GERD (gastroesophageal reflux disease) 07/21/2012   Hiatal hernia    History of alcohol abuse 04/26/2015   History of colitis 11/29/2014   History of marijuana use 01/20/2015   Hypertriglyceridemia    Hypothyroid    Iritis    frequent   Iron deficiency anemia 05/13/2020   Macrocytic anemia  03/06/2020   Major depressive disorder 09/10/2020   Ovarian cyst    Polyarthralgia 03/06/2020   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   PTSD (post-traumatic stress disorder) 09/10/2020   S/P closed fracture of wrist 03/06/2020   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   SVT (supraventricular tachycardia)    last issue 12/2017   Thyroid nodule 01/11/2014    Past Surgical History:  Procedure Laterality Date   BIOPSY N/A 02/05/2015   Procedure: BIOPSY;  Surgeon: West Bali, MD;  Location: [facility];  Service: Endoscopy;  Laterality: N/A;   COLONOSCOPY     COLONOSCOPY WITH PROPOFOL N/A 02/05/2015   QHU:TMLYY HH/mild diverticulosis   ESOPHAGEAL DILATION N/A 11/21/2015   Procedure: ESOPHAGEAL DILATION;  Surgeon: Corbin Ade, MD;  Location: [facility];  Service: Endoscopy;  Laterality: N/A;   ESOPHAGOGASTRODUODENOSCOPY  11/06/2011   [name]: MILD Esophagitis/PATENT ESOPHAGEAL Stricture/  Moderate gastritis. Bx no.hpylori or celiac, +gastritis   ESOPHAGOGASTRODUODENOSCOPY (EGD) WITH PROPOFOL N/A 03/20/2014   [name]: 1. Mild esophagitis & distal esophagela stricture. 2. small hiatal hernia 3. moderate non-erosive gastritis and mild duodenits   ESOPHAGOGASTRODUODENOSCOPY (EGD) WITH PROPOFOL N/A 11/21/2015   Dr. Jena Gauss: LA grade B esophagitis, MW tear likely source of hematemesis   LAPAROSCOPIC TUBAL LIGATION Bilateral 09/14/2018   Procedure: LAPAROSCOPIC TUBAL LIGATION;  Surgeon: Allie Bossier, MD;  Location: [facility];  Service: Gynecology;  Laterality: Bilateral;   LIVER BIOPSY  02/23/2020   Procedure: OPEN LIVER BIOPSY;  Surgeon: Fritzi Mandes, MD;  Location: [facility] OR;  Service: General;;  SAVORY DILATION N/A 03/20/2014   Procedure: SAVORY DILATION;  Surgeon: West Bali, MD;  Location: [facility];  Service: Endoscopy;  Laterality: N/A;  dilated with # 12.8, 14,15,16   SPLENECTOMY, TOTAL N/A 02/23/2020   Procedure: SPLENECTOMY;  Surgeon: Fritzi Mandes, MD;  Location: [facility];  Service: General;   Laterality: N/A;   TOOTH EXTRACTION      Family History  Problem Relation Age of Onset   Stomach cancer Paternal Grandfather        colon cancer   Cancer Paternal Grandfather        throat and esophagus   Breast cancer Maternal Grandmother    Cancer Maternal Grandmother        skin   Anxiety disorder Maternal Grandmother    Dementia Maternal Grandmother    Hypertension Mother    Other Mother        fatty liver   Hyperlipidemia Mother    Liver disease Mother        fatty liver, does not drink.    Sleep apnea Mother    Other Father        varicose veins; stomach issues; hernia   Hypertension Father    Hyperlipidemia Father    Cancer Father        prostate   Arthritis Father        rheumatoid   Neuropathy Father    Rheum arthritis Father    Thyroid disease Sister    Hypertension Sister    Other Brother        hernia   Diabetes Maternal Grandfather    Heart disease Maternal Grandfather    Dementia Maternal Grandfather    Other Paternal Grandmother        hernia   COPD Paternal Grandmother    Diabetes Paternal Grandmother    Healthy Daughter     SOCIAL HISTORY: Social History   Socioeconomic History   Marital status: Divorced    Spouse name: Not on file   Number of children: 1   Years of education: 16   Highest education level: Some college, no degree  Occupational History   Occupation: Unemployed  Tobacco Use   Smoking status: Some Days    Pack years: 0.00    Types: Cigarettes    Last attempt to quit: 12/22/2017    Years since quitting: 2.7   Smokeless tobacco: Never   Tobacco comments:    1 pack lasts about 3 days  Vaping Use   Vaping Use: Never used  Substance and Sexual Activity   Alcohol use: No    Alcohol/week: 0.0 standard drinks    Comment: Sober since 2018   Drug use: Yes    Types: Marijuana    Comment: occasional MJ use to stimulate appetite   Sexual activity: Yes    Birth control/protection: Condom  Other Topics Concern   Not on file   Social History Narrative   Lives alone with a roommate. Dating and in safe relationship. Does not smoke. Denies drug use.    Previous MD: Phill Mutter, NP ([place], [place])   Right handed   Drinks caffeine   One story home   Social Determinants of Health   Financial Resource Strain: High Risk   Difficulty of Paying Living Expenses: Hard  Food Insecurity: No Food Insecurity   Worried About Running Out of Food in the Last Year: Never true   Ran Out of Food in the Last Year: Never true  Transportation Needs: No Transportation  Needs   Lack of Transportation (Medical): No   Lack of Transportation (Non-Medical): No  Physical Activity: Inactive   Days of Exercise per Week: 0 days   Minutes of Exercise per Session: 0 min  Stress: Stress Concern Present   Feeling of Stress : Rather much  Social Connections: Moderately Isolated   Frequency of Communication with Friends and Family: More than three times a week   Frequency of Social Gatherings with Friends and Family: Twice a week   Attends Religious Services: More than 4 times per year   Active Member of [employer] or Organizations: No   Attends Engineer, structural: Never   Marital Status: Divorced  Catering manager Violence: Not At Risk   Fear of Current or Ex-Partner: No   Emotionally Abused: No   Physically Abused: No   Sexually Abused: No    Allergies  Allergen Reactions   Penicillins Anaphylaxis    Has patient had a PCN reaction causing immediate rash, facial/tongue/throat swelling, SOB or lightheadedness with hypotension: yes Has patient had a PCN reaction causing severe rash involving mucus membranes or skin necrosis: No Has patient had a PCN reaction that required hospitalization Yes Has patient had a PCN reaction occurring within the last 10 years: No If all of the above answers are "NO", then may proceed with Cephalosporin use.    Lidocaine Other (See Comments)    Can use topical lidocaine but if ingested causes  Hallucinations.    Current Outpatient Medications  Medication Sig Dispense Refill   acetaminophen (TYLENOL) 325 MG tablet Take 2 tablets (650 mg total) by mouth every 6 (six) hours as needed.     azelastine (ASTELIN) 0.1 % nasal spray Place 1 spray into both nostrils 2 (two) times daily. Use in each nostril as directed 30 mL 12   Calcium Carb-Cholecalciferol (CALCIUM/VITAMIN D PO) Take by mouth daily.     cholecalciferol (VITAMIN D3) 25 MCG (1000 UNIT) tablet Take 1 tablet (1,000 Units total) by mouth daily. 30 tablet 11   Cyanocobalamin (VITAMIN B-12 IJ) Inject as directed every 30 (thirty) days.     ELIQUIS 5 MG TABS tablet TAKE 1 TABLET(5 MG) BY MOUTH TWICE DAILY 60 tablet 1   fluticasone (FLONASE) 50 MCG/ACT nasal spray Place 2 sprays into both nostrils daily. 16 g 6   fluticasone (FLOVENT HFA) 44 MCG/ACT inhaler Inhale 2 puffs into the lungs 2 (two) times daily. 1 each 5   gabapentin (NEURONTIN) 100 MG capsule 2 CAPSULES AT BEDTIME 60 capsule 0   loratadine (CLARITIN) 10 MG tablet Take 10 mg by mouth daily.     methocarbamol (ROBAXIN) 500 MG tablet TAKE 1 TABLET BY MOUTH FOUR TIMES DAILY AS NEEDED 60 tablet 1   MILK THISTLE PO Take by mouth daily.     OVER THE COUNTER MEDICATION Premeir protein drink once a day     pantoprazole (PROTONIX) 40 MG tablet Take 1 tablet (40 mg total) by mouth 2 (two) times daily before a meal. 180 tablet 1   PROAIR HFA 108 (90 Base) MCG/ACT inhaler INHALE 2 PUFFS INTO THE LUNGS EVERY 6 HOURS AS NEEDED FOR WHEEZING OR SHORTNESS OF BREATH 8.5 g 1   traMADol (ULTRAM) 50 MG tablet Take 1 tablet (50 mg total) by mouth every 6 (six) hours as needed. 30 tablet 1   ursodiol (ACTIGALL) 500 MG tablet Take 1 tablet (500 mg total) by mouth 2 (two) times daily. 60 tablet 3   ondansetron (ZOFRAN-ODT) 4 MG disintegrating tablet  Take 1 tablet (4 mg total) by mouth every 8 (eight) hours as needed for nausea or vomiting. (Patient not taking: No sig reported) 20 tablet 0   No  current facility-administered medications for this visit.    REVIEW OF SYSTEMS:  [X]  denotes positive finding, [ ]  denotes negative finding Cardiac  Comments:  Chest pain or chest pressure:    Shortness of breath upon exertion:    Short of breath when lying flat:    Irregular heart rhythm:        Vascular    Pain in calf, thigh, or hip brought on by ambulation:    Pain in feet at night that wakes you up from your sleep:     Blood clot in your veins:    Leg swelling:         Pulmonary    Oxygen at home:    Productive cough:     Wheezing:         Neurologic    Sudden weakness in arms or legs:     Sudden numbness in arms or legs:     Sudden onset of difficulty speaking or slurred speech:    Temporary loss of vision in one eye:     Problems with dizziness:         Gastrointestinal    Blood in stool:     Vomited blood:         Genitourinary    Burning when urinating:     Blood in urine:        Psychiatric    Major depression:         Hematologic    Bleeding problems:    Problems with blood clotting too easily:        Skin    Rashes or ulcers:        Constitutional    Fever or chills:      PHYSICAL EXAM: Vitals:   10/08/20 1208  BP: 102/67  Pulse: 71  Resp: 14  Temp: 98.4 F (36.9 C)  TempSrc: Temporal  SpO2: 98%  Weight: 131 lb (59.4 kg)  Height: 5\' 4"  (1.626 m)    GENERAL: The patient is a well-nourished female, in no acute distress. The vital signs are documented above. CARDIAC: There is a regular rate and rhythm.  VASCULAR:  Palpable femoral pulses bilaterally Palpable dorsalis pedis pulses bilateral Varicosity notable in the right posterior lateral thigh where she describes focal area of pain PULMONARY: No respiratory distress. ABDOMEN: Soft and non-tender. MUSCULOSKELETAL: There are no major deformities or cyanosis. NEUROLOGIC: No focal weakness or paresthesias are detected. SKIN: There are no ulcers or rashes noted. PSYCHIATRIC: The patient  has a normal affect.  DATA:   Lower Venous Reflux Study   Patient Name:  Kara Stewart  Date of Exam:   10/08/2020  Medical Rec #:       Accession #:    [name]  Date of Birth: 11/03/1975      Patient Gender: F  Patient Age:   38Y  Exam Location:  [id number] [facility]  Procedure:      VAS 03/25/1976 LOWER EXTREMITY VENOUS REFLUX  Referring Phys: 0Y Rudene Anda Kimya Mccahill    ---------------------------------------------------------------------------  -----     Indications: Varicosities, Pain, and Swelling.      Comparison Study: 08/15/2020: Rt- No evidence of deep vein thrombosis.   Performing Technologist: [id number]      Examination Guidelines: A complete evaluation includes B-mode imaging,  spectral  Doppler, color Doppler, and power Doppler as needed of all accessible  portions  of each vessel. Bilateral testing is considered an integral part of a  complete  examination. Limited examinations for reoccurring indications may be  performed  as noted. The reflux portion of the exam is performed with the patient in  reverse Trendelenburg.  Significant venous reflux is defined as >500 ms in the superficial venous  system, and >1 second in the deep venous system.      Venous Reflux Times  +--------------+---------+------+-----------+------------+--------+  RIGHT         Reflux NoRefluxReflux TimeDiameter cmsComments                          Yes                                   +--------------+---------+------+-----------+------------+--------+  CFV           no                                              +--------------+---------+------+-----------+------------+--------+  FV mid        no                                              +--------------+---------+------+-----------+------------+--------+  Popliteal     no                                               +--------------+---------+------+-----------+------------+--------+  [facility]    no                            0.56              +--------------+---------+------+-----------+------------+--------+  GSV prox thighno                            0.42              +--------------+---------+------+-----------+------------+--------+  GSV mid thigh no                            0.35              +--------------+---------+------+-----------+------------+--------+  GSV dist thighno                            0.34              +--------------+---------+------+-----------+------------+--------+  GSV at knee             yes    >500 ms      0.37              +--------------+---------+------+-----------+------------+--------+  GSV prox calf           yes    >500 ms      0.38              +--------------+---------+------+-----------+------------+--------+  SSV Pop Fossa no                            0.22              +--------------+---------+------+-----------+------------+--------+  SSV prox calf           yes    >500 ms      0.16              +--------------+---------+------+-----------+------------+--------+  SSV mid calf  no                            0.18              +--------------+---------+------+-----------+------------+--------+      +--------------+---------+------+-----------+------------+--------+  LEFT          Reflux NoRefluxReflux TimeDiameter cmsComments                          Yes                                   +--------------+---------+------+-----------+------------+--------+  CFV                     yes   >1 second                       +--------------+---------+------+-----------+------------+--------+  FV mid        no                                              +--------------+---------+------+-----------+------------+--------+  Popliteal     no                                               +--------------+---------+------+-----------+------------+--------+  GSV at SFJ              yes    >500 ms      0.61              +--------------+---------+------+-----------+------------+--------+  GSV prox thigh          yes    >500 ms      0.49              +--------------+---------+------+-----------+------------+--------+  GSV mid thigh           yes    >500 ms      0.48              +--------------+---------+------+-----------+------------+--------+  GSV dist thigh          yes    >500 ms      0.49              +--------------+---------+------+-----------+------------+--------+  GSV at knee             yes    >500 ms      0.49              +--------------+---------+------+-----------+------------+--------+  GSV prox calf           yes    >500 ms  0.43              +--------------+---------+------+-----------+------------+--------+  SSV Pop Fossa no                            0.13              +--------------+---------+------+-----------+------------+--------+  SSV prox calf no                            0.11              +--------------+---------+------+-----------+------------+--------+  SSV mid calf  no                            0.19              +--------------+---------+------+-----------+------------+--------+           Summary:  Bilateral:  - No evidence of deep vein thrombosis seen in the lower extremities,  bilaterally, from the common femoral through the popliteal veins.  - No evidence of superficial venous thrombosis in the lower extremities,  bilaterally.     Right:  - Venous reflux is noted in the right greater saphenous vein at the knee  and in the calf.  - Venous reflux is noted in the right short saphenous vein.      Left:  - Venous reflux is noted in the left common femoral vein.  - Venous reflux is noted in the left sapheno-femoral junction.  - Venous reflux is noted  in the left greater saphenous vein in the thigh.  - Venous reflux is noted in the left greater saphenous vein in the calf.     *See table(s) above for measurements and observations.   Electronically signed by Sherald Hess MD on 10/08/2020 at 12:17:15 PM.      Assessment/Plan:  45 year old female presents for evaluation of lower extremity varicose veins.  Her main complaint is in the right leg at a posterior lateral varicosity that is notable on exam.  I discussed that her reflux study shows no long segment superficial reflux in the right leg and she has much more significant reflux in the left leg.  We sized her for new thigh-high compression stockings 20 to 30 mmHg today and discussed conservative measures with leg elevation and compression.  I discussed that if this does not alleviate her symptoms we could certainly evaluate for sclerotherapy in the right lateral thigh where she has this focal area that she states is particularly painful at the end of the day.  Without long segment superficial reflux and without reflux at the saphenofemoral junction in the right leg, I do not think she would qualify for laser ablation.  I did discuss with her that sclerotherapy would be out-of-pocket cost.  I will have our sclerotherapy nurse contact her in several weeks to see how she is doing.   Cephus Shelling, MD [facility] Office: [phone]

## 2020-10-15 ENCOUNTER — Encounter: Payer: Medicaid Other | Admitting: Adult Health

## 2020-10-17 ENCOUNTER — Encounter: Payer: Self-pay | Admitting: Internal Medicine

## 2020-10-17 ENCOUNTER — Ambulatory Visit: Payer: Medicaid Other | Admitting: Internal Medicine

## 2020-10-17 ENCOUNTER — Other Ambulatory Visit: Payer: Self-pay

## 2020-10-17 DIAGNOSIS — J320 Chronic maxillary sinusitis: Secondary | ICD-10-CM | POA: Diagnosis not present

## 2020-10-17 DIAGNOSIS — F17210 Nicotine dependence, cigarettes, uncomplicated: Secondary | ICD-10-CM

## 2020-10-17 DIAGNOSIS — J449 Chronic obstructive pulmonary disease, unspecified: Secondary | ICD-10-CM | POA: Diagnosis not present

## 2020-10-17 MED ORDER — DOXYCYCLINE HYCLATE 100 MG PO TABS: 100.0000 mg | ORAL_TABLET | Freq: Two times a day (BID) | ORAL | 0 refills | Status: DC

## 2020-10-17 MED ORDER — BUDESONIDE-FORMOTEROL FUMARATE 80-4.5 MCG/ACT IN AERO: INHALATION_SPRAY | RESPIRATORY_TRACT | 11 refills | Status: DC

## 2020-10-17 NOTE — Assessment & Plan Note (Signed)

## 2020-10-17 NOTE — Progress Notes (Signed)
Kara Stewart, female    DOB: 1975/08/31    MRN: [id number]   Brief patient profile:  50 yowf smoker  since 2020 with PBC with h/o asthma a child - elementary school then flared back up in HS on prn saba but since with worse breathing since Jan 2022 and placed on flovent hfa and referred to pulmonary clinic in [place]  10/17/2020 by Dr    Allena Katz   History of Present Illness  10/17/2020  Pulmonary/ 1st office eval/ Delanee Xin / [place] [facility]  Chief Complaint  Patient presents with   Consult    Patient states that she had asthma as a child and it went away but now thinks that she has it with seasonal allergies. States that she gets bronchitis a few times a year. Patient is on Flovent everyday and Proair as needed.  Dyspnea:  75 ft to mailbox slt uphill better if takes proair first , also sometimes loses breath sitting still and recovers within a few breaths s saba  Cough: prod clear/occ yellow with allergy season thick esp when hot/ worse in am x 30 min  Sleep:  nightly since jan 2022  SABA use: maybe twice daily   No obvious pattern in day to day or daytime variability or assoc excess/ purulent sputum or mucus plugs or hemoptysis or cp or chest tightness, subjective wheeze or overt sinus or hb symptoms.    . Also denies any obvious fluctuation of symptoms with weather or environmental changes or other aggravating or alleviating factors except as outlined above   No unusual exposure hx or h/o childhood pnaor knowledge of premature birth.  Current Allergies, Complete Past Medical History, Past Surgical History, Family History, and Social History were reviewed in [employer] record.  ROS  The following are not active complaints unless bolded Hoarseness, sore throat, dysphagia, dental problems, itching, sneezing,  nasal congestion or discharge of excess mucus or purulent secretions, ear ache,   fever, chills, sweats, unintended wt loss or wt gain, classically pleuritic  or exertional cp,  orthopnea pnd or arm/hand swelling  or leg swelling, presyncope, palpitations, abdominal pain, anorexia, nausea, vomiting, diarrhea  or change in bowel habits or change in bladder habits, change in stools or change in urine, dysuria, hematuria,  rash, arthralgias, visual complaints, headache, numbness, weakness or ataxia or problems with walking or coordination,  change in mood or  memory.            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 Treating with URSO due to elevated AMA. LFTs slowly improving. Concern for recurrent non-tense ascites on exam: patient feels uncomfortable and requesting paracentesis. This is likely   Asthma due to seasonal allergies 04/17/2020   Chronic liver disease 11/16/2017   Deep vein thrombosis of portal vein 03/06/2020   Dysphagia 05/29/2020   Fibromyalgia    Generalized anxiety disorder with panic attacks    GERD (gastroesophageal reflux disease) 07/21/2012   Hiatal hernia    History of alcohol abuse 04/26/2015   History of colitis 11/29/2014   History of marijuana use 01/20/2015   Hypertriglyceridemia    Hypothyroid    Iritis    frequent   Iron deficiency anemia 05/13/2020   Macrocytic anemia 03/06/2020   Major depressive disorder 09/10/2020  Ovarian cyst    Polyarthralgia 03/06/2020   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      azelastine (ASTELIN) 0.1 % nasal spray Place 1 spray into both nostrils 2 (two) times daily. Use in each nostril as directed 30 mL 12   Calcium Carb-Cholecalciferol (CALCIUM/VITAMIN D PO) Take by mouth daily.     cholecalciferol (VITAMIN D3) 25 MCG (1000 UNIT) tablet Take 1 tablet (1,000 Units total) by mouth daily. 30 tablet 11   Cyanocobalamin (VITAMIN B-12 IJ) Inject as directed every 30 (thirty) days.     ELIQUIS 5 MG TABS tablet TAKE 1 TABLET(5 MG) BY MOUTH TWICE DAILY 60 tablet 1   fluticasone (FLONASE) 50 MCG/ACT nasal spray Place 2 sprays into both nostrils daily. 16 g 6   fluticasone (FLOVENT HFA) 44 MCG/ACT inhaler Inhale 2 puffs into the lungs 2 (two) times daily. 1 each 5   gabapentin (NEURONTIN) 100 MG capsule 2 CAPSULES AT BEDTIME 60 capsule 0   loratadine (CLARITIN) 10 MG tablet Take 10 mg by mouth daily.     methocarbamol (ROBAXIN) 500 MG tablet TAKE 1 TABLET BY MOUTH FOUR TIMES DAILY AS NEEDED 60 tablet 1   MILK THISTLE PO Take by mouth daily.     ondansetron (ZOFRAN-ODT) 4 MG disintegrating tablet Take 1 tablet (4 mg total) by mouth every 8 (eight) hours as needed for nausea or vomiting. 20 tablet 0   OVER THE COUNTER MEDICATION Premeir protein drink once a day     pantoprazole (PROTONIX) 40 MG tablet Take 1 tablet (40 mg total) by mouth 2 (two) times daily before a meal. 180 tablet 1   PROAIR HFA 108 (90 Base) MCG/ACT inhaler INHALE 2 PUFFS INTO THE LUNGS EVERY 6 HOURS AS NEEDED FOR WHEEZING OR SHORTNESS OF BREATH 8.5 g 1   traMADol (ULTRAM) 50 MG tablet Take 1 tablet (50 mg total) by mouth every 6 (six) hours as needed. 30 tablet 1   ursodiol (ACTIGALL) 500 MG tablet Take 1 tablet (500 mg total) by mouth 2 (two) times daily. 60 tablet 3   No facility-administered medications prior to visit.     Objective:     BP 100/60 (BP Location: Left Arm, Patient Position: Sitting, Cuff Size: Normal)   Pulse 89   Temp 98.1 F (36.7 C) (Oral)   Ht 5\' 4"  (1.626 m)   Wt 131 lb (59.4  kg)   SpO2 98%   BMI 22.49 kg/m   SpO2: 98 %  Amb wf walks with cane    HEENT : pt wearing mask not removed for exam due to covid -19 concerns.    NECK :  without JVD/Nodes/TM/ nl carotid upstrokes bilaterally   LUNGS: no acc muscle use,  Nl contour chest which is clear to A and P bilaterally without cough on insp or exp maneuvers   CV:  RRR  no s3 or murmur or increase in P2, and no edema   ABD:  soft and nontender with nl inspiratory excursion in the supine position. No bruits or organomegaly appreciated, bowel sounds nl  MS:  Nl gait/ ext  warm without deformities, calf tenderness, cyanosis or clubbing No obvious joint restrictions   SKIN: warm and dry without lesions    NEURO:  alert, approp, nl sensorium with  no motor or cerebellar deficits apparent.    CXR PA and Lateral:   10/17/2020 :    I personally reviewed images and agree with radiology impression as follows:    Did not go for cxr as rec    I personally reviewed images and agree with radiology impression as follows:  MRI   08/21/20 Mucosal edema paranasal sinuses. Retention cyst right maxillary sinus and air-fluid level left maxillary sinus.      Assessment   Asthmatic bronchitis , chronic (HCC) Acitive smoker -  10/17/2020 try change from flovent 44 to symbicort 80 2bid and return for pfts @ 6 weeks   DDX of  difficult airways management almost all start with A and  include Adherence, Ace Inhibitors, Acid Reflux, Active Sinus Disease, Alpha 1 Antitripsin deficiency, Anxiety masquerading as Airways dz,  ABPA,  Allergy(esp in young), Aspiration (esp in elderly), Adverse effects of meds,  Active smoking or vaping, A bunch of PE's (a small clot burden can't cause this syndrome unless there is already severe underlying pulm or vascular dz with poor reserve) plus two Bs  = Bronchiectasis and Beta blocker use..and one C= CHF   Adherence is always the initial "prime suspect" and is a multilayered concern that  requires a "trust but verify" approach in every patient - starting with knowing how to use medications, especially inhalers, correctly, keeping up with refills and understanding the fundamental difference between maintenance and prns vs those medications only taken for a very short course and then stopped and not refilled.  - - The proper method of use, as well as anticipated side effects, of a metered-dose inhaler were discussed and demonstrated to the patient using teach back method  Active smoking (see separate a/p)    ? Active sinus dz/severe pcn allergy so try doxy x 14 days and ent prn   ? Acid (or non-acid) GERD > always difficult to exclude as up to 75% of pts in some series report no assoc GI/ Heartburn symptoms> rec max (24h)  acid suppression and diet restrictions/ reviewed and instructions given in writing.   ? Anxiety > usually at the bottom of this list of usual suspects but may explain resting symptoms and may interfere with adherence and also interpretation of response or lack thereof to symptom management which can be quite subjective.  - reviewed rubber band analogy to help her understand why if she doesn't breath all the way out the IC is lower  ? ABPA  > check phenotype if non-reversible component on f/u PFTs planned   Each maintenance medication was reviewed in detail including emphasizing most importantly the difference between maintenance and prns and under what circumstances the prns are to be triggered using an action plan format where appropriate.  Total time for H and P, chart review, counseling, reviewing hfa device(s) , directly observing portions of ambulatory 02 saturation study/ and generating customized AVS unique to this office visit / same day charting = 48  min              Left maxillary sinusitis See MRI  08/21/20 > doxy x 14 days then ent prn   May be related to NG for surgery placed in nov 2021 per pt      Cigarette smoker 4-5 min discussion re  active cigarette  smoking in addition to office E&M  Ask about tobacco use:   ongoing Advise quitting   I took an extended  opportunity with this patient to outline the consequences of continued cigarette use  in airway disorders based on all the data we have from the multiple national lung health studies (perfomed over decades at millions of dollars in cost)  indicating that smoking cessation, not choice of inhalers or physicians, is the most important aspect of her care.   Assess willingness:  Not committed at this point Assist in quit attempt:  Per PCP when ready Arrange follow up:   Follow up per Primary Care planned      Sandrea Hughs, MD 10/17/2020

## 2020-10-17 NOTE — Assessment & Plan Note (Signed)
See MRI  08/21/20 > doxy x 14 days then ent prn   May be related to NG for surgery placed in nov 2021 per pt

## 2020-10-17 NOTE — Patient Instructions (Addendum)
Be sure you take pantoprazole 40 mg Take 30-60 min before first meal of the day   Doxycyline 100 mg twice daily x 14 days with glass of water before eating and if your Left sided nasal symptoms don't improve with antibiotics please call for ENT referral   Plan A = Automatic = Always=    stop flovent and start Symbicort 80 Take 2 puffs first thing in am and then another 2 puffs about 12 hours later.    Work on inhaler technique:  relax and gently blow all the way out then take a nice smooth full deep breath back in, triggering the inhaler at same time you start breathing in.  Hold for up to 5 seconds if you can. Blow out thru nose. Rinse and gargle with water when done.  If mouth or throat bother you at all,  try brushing teeth/gums/tongue with arm and hammer toothpaste/ make a slurry and gargle and spit out.       Plan B = Backup (to supplement plan A, not to replace it) Only use your albuterol inhaler(PROAIR)  as a rescue medication to be used if you can't catch your breath by resting or doing a relaxed purse lip breathing pattern.  - The less you use it, the better it will work when you need it. - Ok to use the inhaler up to 2 puffs  every 4 hours if you must but call for appointment if use goes up over your usual need - Don't leave home without it !!  (think of it like the spare tire for your car)      Please remember to go to the  x-ray department  @  [facility] for your tests - we will call you with the results when they are available     The key is to stop smoking completely before smoking completely stops you!   Please schedule a follow up office visit in 6 weeks, with PFT on return  - bring inhalers with you

## 2020-10-17 NOTE — Assessment & Plan Note (Addendum)
Acitive smoker -  10/17/2020 try change from flovent 44 to symbicort 80 2bid and return for pfts @ 6 weeks   DDX of  difficult airways management almost all start with A and  include Adherence, Ace Inhibitors, Acid Reflux, Active Sinus Disease, Alpha 1 Antitripsin deficiency, Anxiety masquerading as Airways dz,  ABPA,  Allergy(esp in young), Aspiration (esp in elderly), Adverse effects of meds,  Active smoking or vaping, A bunch of PE's (a small clot burden can't cause this syndrome unless there is already severe underlying pulm or vascular dz with poor reserve) plus two Bs  = Bronchiectasis and Beta blocker use..and one C= CHF   Adherence is always the initial "prime suspect" and is a multilayered concern that requires a "trust but verify" approach in every patient - starting with knowing how to use medications, especially inhalers, correctly, keeping up with refills and understanding the fundamental difference between maintenance and prns vs those medications only taken for a very short course and then stopped and not refilled.  - - The proper method of use, as well as anticipated side effects, of a metered-dose inhaler were discussed and demonstrated to the patient using teach back method  Active smoking (see separate a/p)    ? Active sinus dz/severe pcn allergy so try doxy x 14 days and ent prn   ? Acid (or non-acid) GERD > always difficult to exclude as up to 75% of pts in some series report no assoc GI/ Heartburn symptoms> rec max (24h)  acid suppression and diet restrictions/ reviewed and instructions given in writing.   ? Anxiety > usually at the bottom of this list of usual suspects but may explain resting symptoms and may interfere with adherence and also interpretation of response or lack thereof to symptom management which can be quite subjective.  - reviewed rubber band analogy to help her understand why if she doesn't breath all the way out the IC is lower  ? ABPA  > check phenotype if  non-reversible component on f/u PFTs planned   Each maintenance medication was reviewed in detail including emphasizing most importantly the difference between maintenance and prns and under what circumstances the prns are to be triggered using an action plan format where appropriate.  Total time for H and P, chart review, counseling, reviewing hfa device(s) , directly observing portions of ambulatory 02 saturation study/ and generating customized AVS unique to this office visit / same day charting = 48  min

## 2020-10-17 NOTE — Addendum Note (Signed)
Addended by: Benjie Karvonen R on: 10/17/2020 01:08 PM   Modules accepted: Orders

## 2020-10-25 ENCOUNTER — Telehealth: Payer: Self-pay

## 2020-10-25 NOTE — Telephone Encounter (Signed)
Called pt to f/u regarding sclerotherapy per MD note. Pt has a voicemail that is full.

## 2020-10-28 ENCOUNTER — Other Ambulatory Visit: Payer: Self-pay | Admitting: Gastroenterology

## 2020-10-28 DIAGNOSIS — K743 Primary biliary cirrhosis: Secondary | ICD-10-CM

## 2020-12-02 ENCOUNTER — Ambulatory Visit: Payer: Medicaid Other | Admitting: Internal Medicine

## 2020-12-02 NOTE — Progress Notes (Deleted)
Kara Stewart, female    DOB: 1975/10/25    MRN: [id number]   Brief patient profile:  44 yowf smoker  since 2020 with PBC with h/o asthma a child - elementary school then flared back up in HS on prn saba but since with worse breathing since Jan 2022 and placed on flovent hfa and referred to pulmonary clinic in [place]  10/17/2020 by Dr    Allena Katz   History of Present Illness  10/17/2020  Pulmonary/ 1st office eval/ Kara Stewart / [place] [facility]  Chief Complaint  Patient presents with   Consult    Patient states that she had asthma as a child and it went away but now thinks that she has it with seasonal allergies. States that she gets bronchitis a few times a year. Patient is on Flovent everyday and Proair as needed.  Dyspnea:  75 ft to mailbox slt uphill better if takes proair first , also sometimes loses breath sitting still and recovers within a few breaths s saba  Cough: prod clear/occ yellow with allergy season thick esp when hot/ worse in am x 30 min  Sleep:  nightly since jan 2022  SABA use: maybe twice daily  Rec Be sure you take pantoprazole 40 mg Take 30-60 min before first meal of the day   Doxycyline 100 mg twice daily x 14 days with glass of water before eating and if your Left sided nasal symptoms don't improve with antibiotics please call for ENT referral   Plan A = Automatic = Always=    stop flovent and start Symbicort 80 Take 2 puffs first thing in am and then another 2 puffs about 12 hours later.   Work on inhaler technique: Plan B = Backup (to supplement plan A, not to replace it) Only use your albuterol inhaler(PROAIR)  as a rescue medication   key is to stop smoking completely before smoking completely stops you! Please schedule a follow up office visit in 6 weeks, with PFT on return  - bring inhalers with you  Cxr  > not go    12/02/2020  f/u ov/[place] office/[facility] re: AB/sinusitis  No chief complaint on file.  Asthmatic bronchitis , chronic (HCC) Acitive  smoker -  10/17/2020 try change from flovent 44 to symbicort 80 2bid and return for pfts @ 6 weeks  Left maxillary sinusitis See MRI  08/21/20 > doxy x 14 days then ent prn  Dyspnea:  *** Cough: *** Sleeping: *** SABA use: *** 02: *** Covid status: *** Lung cancer screening: ***   No obvious day to day or daytime variability or assoc excess/ purulent sputum or mucus plugs or hemoptysis or cp or chest tightness, subjective wheeze or overt sinus or hb symptoms.   *** without nocturnal  or early am exacerbation  of respiratory  c/o's or need for noct saba. Also denies any obvious fluctuation of symptoms with weather or environmental changes or other aggravating or alleviating factors except as outlined above   No unusual exposure hx or h/o childhood pna/ asthma or knowledge of premature birth.  Current Allergies, Complete Past Medical History, Past Surgical History, Family History, and Social History were reviewed in [employer] record.  ROS  The following are not active complaints unless bolded Hoarseness, sore throat, dysphagia, dental problems, itching, sneezing,  nasal congestion or discharge of excess mucus or purulent secretions, ear ache,   fever, chills, sweats, unintended wt loss or wt gain, classically pleuritic or exertional cp,  orthopnea pnd  or arm/hand swelling  or leg swelling, presyncope, palpitations, abdominal pain, anorexia, nausea, vomiting, diarrhea  or change in bowel habits or change in bladder habits, change in stools or change in urine, dysuria, hematuria,  rash, arthralgias, visual complaints, headache, numbness, weakness or ataxia or problems with walking or coordination,  change in mood or  memory.        No outpatient medications have been marked as taking for the 12/02/20 encounter (Appointment) with Nyoka Cowden, MD.            Past Medical History:  Diagnosis Date   Abdominal pain, epigastric 05/29/2020   Alcohol abuse, in remission     Alcoholic hepatitis with ascites 10/05/2016   Has continued to abstain from ETOH since May 2018, known severe hepatic steatosis, HSM, possible early cirrhosis. Treating with URSO due to elevated AMA. LFTs slowly improving. Concern for recurrent non-tense ascites on exam: patient feels uncomfortable and requesting paracentesis. This is likely   Asthma due to seasonal allergies 04/17/2020   Chronic liver disease 11/16/2017   Deep vein thrombosis of portal vein 03/06/2020   Dysphagia 05/29/2020   Fibromyalgia    Generalized anxiety disorder with panic attacks    GERD (gastroesophageal reflux disease) 07/21/2012   Hiatal hernia    History of alcohol abuse 04/26/2015   History of colitis 11/29/2014   History of marijuana use 01/20/2015   Hypertriglyceridemia    Hypothyroid    Iritis    frequent   Iron deficiency anemia 05/13/2020   Macrocytic anemia 03/06/2020   Major depressive disorder 09/10/2020   Ovarian cyst    Polyarthralgia 03/06/2020   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   PTSD (post-traumatic stress disorder) 09/10/2020   S/P closed fracture of wrist 03/06/2020   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

## 2020-12-05 ENCOUNTER — Other Ambulatory Visit: Payer: Self-pay | Admitting: Internal Medicine

## 2020-12-05 NOTE — Addendum Note (Signed)
Addended by: Rosann Auerbach C on: 12/05/2020 03:41 PM   Modules accepted: Orders

## 2020-12-06 ENCOUNTER — Inpatient Hospital Stay (HOSPITAL_COMMUNITY): Payer: Medicaid Other | Attending: Hematology

## 2020-12-06 ENCOUNTER — Other Ambulatory Visit: Payer: Self-pay

## 2020-12-06 DIAGNOSIS — D509 Iron deficiency anemia, unspecified: Secondary | ICD-10-CM | POA: Insufficient documentation

## 2020-12-06 DIAGNOSIS — E559 Vitamin D deficiency, unspecified: Secondary | ICD-10-CM

## 2020-12-06 LAB — CBC WITH DIFFERENTIAL/PLATELET
Abs Immature Granulocytes: 0.02 10*3/uL (ref 0.00–0.07)
Basophils Absolute: 0.1 10*3/uL (ref 0.0–0.1)
Basophils Relative: 2 %
Eosinophils Absolute: 0.4 10*3/uL (ref 0.0–0.5)
Eosinophils Relative: 4 %
HCT: 45.1 % (ref 36.0–46.0)
Hemoglobin: 14.7 g/dL (ref 12.0–15.0)
Immature Granulocytes: 0 %
Lymphocytes Relative: 39 %
Lymphs Abs: 3.5 10*3/uL (ref 0.7–4.0)
MCH: 31.8 pg (ref 26.0–34.0)
MCHC: 32.6 g/dL (ref 30.0–36.0)
MCV: 97.6 fL (ref 80.0–100.0)
Monocytes Absolute: 0.7 10*3/uL (ref 0.1–1.0)
Monocytes Relative: 8 %
Neutro Abs: 4.2 10*3/uL (ref 1.7–7.7)
Neutrophils Relative %: 47 %
Platelets: 354 10*3/uL (ref 150–400)
RBC: 4.62 MIL/uL (ref 3.87–5.11)
RDW: 12.4 % (ref 11.5–15.5)
WBC: 8.9 10*3/uL (ref 4.0–10.5)
nRBC: 0 % (ref 0.0–0.2)

## 2020-12-06 LAB — COMPREHENSIVE METABOLIC PANEL
ALT: 13 U/L (ref 0–44)
AST: 18 U/L (ref 15–41)
Albumin: 4.2 g/dL (ref 3.5–5.0)
Alkaline Phosphatase: 72 U/L (ref 38–126)
Anion gap: 8 (ref 5–15)
BUN: 9 mg/dL (ref 6–20)
CO2: 25 mmol/L (ref 22–32)
Calcium: 9.2 mg/dL (ref 8.9–10.3)
Chloride: 103 mmol/L (ref 98–111)
Creatinine, Ser: 0.72 mg/dL (ref 0.44–1.00)
GFR, Estimated: >60 mL/min (ref >60)
Glucose, Bld: 79 mg/dL (ref 70–99)
Potassium: 4.3 mmol/L (ref 3.5–5.1)
Sodium: 136 mmol/L (ref 135–145)
Total Bilirubin: 0.3 mg/dL (ref 0.3–1.2)
Total Protein: 7.3 g/dL (ref 6.5–8.1)

## 2020-12-06 LAB — IRON AND TIBC
Iron: 49 ug/dL (ref 28–170)
Saturation Ratios: 15 % (ref 10.4–31.8)
TIBC: 319 ug/dL (ref 250–450)
UIBC: 270 ug/dL

## 2020-12-06 LAB — VITAMIN D 25 HYDROXY (VIT D DEFICIENCY, FRACTURES): Vit D, 25-Hydroxy: 29.66 ng/mL — ABNORMAL LOW (ref 30–100)

## 2020-12-06 LAB — FOLATE: Folate: 6.7 ng/mL (ref >5.9)

## 2020-12-06 LAB — VITAMIN B12: Vitamin B-12: 379 pg/mL (ref 180–914)

## 2020-12-06 LAB — FERRITIN: Ferritin: 70 ng/mL (ref 11–307)

## 2020-12-11 LAB — METHYLMALONIC ACID, SERUM: Methylmalonic Acid, Quantitative: 119 nmol/L (ref 0–378)

## 2020-12-12 NOTE — Progress Notes (Signed)
[facility] [address], [place] [address]   CLINIC:  Medical Oncology/Hematology  PCP:  Anabel Halon, MD [address] 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 Day 45 y.o. 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  She was last seen by Rojelio Brenner PA-C on 09/06/2020.  At today's visit, she reports feeling poorly.  No recent hospitalizations, surgeries, or changes in baseline health status.  She does have multiple chronic diseases currently ongoing symptoms in her life (see ROS below), but in general she reports that her symptoms are stable and have not changed from her baseline.  She continues to take Eliquis, but reports that due to her issues with memory she has missed approximately 4-5 doses this month.  She continues to have very heavy menstrual periods with large amount of clots being passed, but denies any other sources of blood loss such as hematemesis, hematochezia, or melena.  She has not yet spoken to her gynecologist about her heavy menstrual bleeding.  She continues to have severe fatigue, but denies any chest pain, dyspnea, or syncopal episodes.  She has little to no energy and 75% appetite. She endorses that she is maintaining a relatively stable weight, although she has continued to slowly lose weight secondary to poor appetite.   REVIEW OF SYSTEMS:  Review of Systems  Constitutional:  Positive for appetite change and fatigue. Negative for chills, diaphoresis, fever and unexpected weight change.  HENT:   Positive for trouble swallowing. Negative for lump/mass and  nosebleeds.   Eyes:  Negative for eye problems.  Respiratory:  Negative for cough, hemoptysis and shortness of breath.   Cardiovascular:  Positive for leg swelling and palpitations. Negative for chest pain.  Gastrointestinal:  Positive for abdominal pain. Negative for blood in stool, constipation, diarrhea, nausea and vomiting.  Genitourinary:  Positive for frequency. Negative for hematuria.   Musculoskeletal:  Positive for arthralgias and back pain.  Skin:  Positive for itching.  Neurological:  Positive for dizziness, extremity weakness, headaches and numbness. Negative for light-headedness.  Hematological:  Does not bruise/bleed easily.  Psychiatric/Behavioral:  Positive for sleep disturbance. The patient is nervous/anxious.      PAST MEDICAL/SURGICAL HISTORY:  Past Medical History:  Diagnosis Date   Abdominal pain, epigastric 05/29/2020   Alcohol abuse, in remission    Alcoholic hepatitis with ascites 10/05/2016   Has continued to abstain from ETOH since May 2018, known severe hepatic steatosis, HSM, possible early cirrhosis. Treating with URSO due to elevated AMA. LFTs slowly improving. Concern for recurrent non-tense ascites on exam: patient feels uncomfortable and requesting paracentesis. This is likely   Asthma due to seasonal allergies 04/17/2020   Chronic liver disease 11/16/2017   Deep vein thrombosis of portal vein 03/06/2020   Dysphagia 05/29/2020   Fibromyalgia    Generalized anxiety disorder with panic attacks    GERD (gastroesophageal reflux disease) 07/21/2012   Hiatal hernia    History of alcohol abuse 04/26/2015   History of colitis 11/29/2014   History of marijuana use 01/20/2015   Hypertriglyceridemia    Hypothyroid    Iritis    frequent   Iron deficiency anemia 05/13/2020   Macrocytic anemia 03/06/2020  Major depressive disorder 09/10/2020   Ovarian cyst    Polyarthralgia 03/06/2020   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   PTSD (post-traumatic stress disorder) 09/10/2020    S/P closed fracture of wrist 03/06/2020   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   SVT (supraventricular tachycardia)    last issue 12/2017   Thyroid nodule 01/11/2014   Past Surgical History:  Procedure Laterality Date   BIOPSY N/A 02/05/2015   Procedure: BIOPSY;  Surgeon: West Bali, MD;  Location: [facility];  Service: Endoscopy;  Laterality: N/A;   COLONOSCOPY     COLONOSCOPY WITH PROPOFOL N/A 02/05/2015   MWU:XLKGM HH/mild diverticulosis   ESOPHAGEAL DILATION N/A 11/21/2015   Procedure: ESOPHAGEAL DILATION;  Surgeon: Corbin Ade, MD;  Location: [facility];  Service: Endoscopy;  Laterality: N/A;   ESOPHAGOGASTRODUODENOSCOPY  11/06/2011   [name]: MILD Esophagitis/PATENT ESOPHAGEAL Stricture/  Moderate gastritis. Bx no.hpylori or celiac, +gastritis   ESOPHAGOGASTRODUODENOSCOPY (EGD) WITH PROPOFOL N/A 03/20/2014   [name]: 1. Mild esophagitis & distal esophagela stricture. 2. small hiatal hernia 3. moderate non-erosive gastritis and mild duodenits   ESOPHAGOGASTRODUODENOSCOPY (EGD) WITH PROPOFOL N/A 11/21/2015   Dr. Jena Gauss: LA grade B esophagitis, MW tear likely source of hematemesis   LAPAROSCOPIC TUBAL LIGATION Bilateral 09/14/2018   Procedure: LAPAROSCOPIC TUBAL LIGATION;  Surgeon: Allie Bossier, MD;  Location: [facility];  Service: Gynecology;  Laterality: Bilateral;   LIVER BIOPSY  02/23/2020   Procedure: OPEN LIVER BIOPSY;  Surgeon: Fritzi Mandes, MD;  Location: [facility];  Service: General;;   SAVORY DILATION N/A 03/20/2014   Procedure: SAVORY DILATION;  Surgeon: West Bali, MD;  Location: [facility];  Service: Endoscopy;  Laterality: N/A;  dilated with # 12.8, 14,15,16   SPLENECTOMY, TOTAL N/A 02/23/2020   Procedure: SPLENECTOMY;  Surgeon: Fritzi Mandes, MD;  Location: [facility];  Service: General;  Laterality: N/A;   TOOTH EXTRACTION       SOCIAL HISTORY:  Social History   Socioeconomic History   Marital status: Divorced    Spouse name: Not on file   Number of children: 1    Years of education: 16   Highest education level: Some college, no degree  Occupational History   Occupation: Unemployed  Tobacco Use   Smoking status: Every Day    Packs/day: 0.50    Types: Cigarettes    Start date: 12/22/2017   Smokeless tobacco: Never   Tobacco comments:    1 pack lasts about 3 days  Vaping Use   Vaping Use: Never used  Substance and Sexual Activity   Alcohol use: No    Alcohol/week: 0.0 standard drinks    Comment: Sober since 2018   Drug use: Yes    Types: Marijuana    Comment: occasional MJ use to stimulate appetite   Sexual activity: Yes    Birth control/protection: Condom  Other Topics Concern   Not on file  Social History Narrative   Lives alone with a roommate. Dating and in safe relationship.     Previous MD: Phill Mutter, NP ([place], )   Right handed   Drinks caffeine   One story home   Social Determinants of Health   Financial Resource Strain: High Risk   Difficulty of Paying Living Expenses: Hard  Food Insecurity: No Food Insecurity   Worried About Running Out of Food in the Last Year: Never true   Ran Out of Food in the Last Year: Never true  Transportation Needs: No Transportation Needs   Lack  of Transportation (Medical): No   Lack of Transportation (Non-Medical): No  Physical Activity: Inactive   Days of Exercise per Week: 0 days   Minutes of Exercise per Session: 0 min  Stress: Stress Concern Present   Feeling of Stress : Rather much  Social Connections: Moderately Isolated   Frequency of Communication with Friends and Family: More than three times a week   Frequency of Social Gatherings with Friends and Family: Twice a week   Attends Religious Services: More than 4 times per year   Active Member of [employer] or Organizations: No   Attends Engineer, structural: Never   Marital Status: Divorced  Catering manager Violence: Not At Risk   Fear of Current or Ex-Partner: No   Emotionally Abused: No   Physically Abused: No    Sexually Abused: No    FAMILY HISTORY:  Family History  Problem Relation Age of Onset   Stomach cancer Paternal Grandfather        colon cancer   Cancer Paternal Grandfather        throat and esophagus   Breast cancer Maternal Grandmother    Cancer Maternal Grandmother        skin   Anxiety disorder Maternal Grandmother    Dementia Maternal Grandmother    Hypertension Mother    Other Mother        fatty liver   Hyperlipidemia Mother    Liver disease Mother        fatty liver, does not drink.    Sleep apnea Mother    Other Father        varicose veins; stomach issues; hernia   Hypertension Father    Hyperlipidemia Father    Cancer Father        prostate   Arthritis Father        rheumatoid   Neuropathy Father    Rheum arthritis Father    Thyroid disease Sister    Hypertension Sister    Other Brother        hernia   Diabetes Maternal Grandfather    Heart disease Maternal Grandfather    Dementia Maternal Grandfather    Other Paternal Grandmother        hernia   COPD Paternal Grandmother    Diabetes Paternal Grandmother    Healthy Daughter     CURRENT MEDICATIONS:  Outpatient Encounter Medications as of 12/13/2020  Medication Sig Note   acetaminophen (TYLENOL) 325 MG tablet Take 2 tablets (650 mg total) by mouth every 6 (six) hours as needed.    azelastine (ASTELIN) 0.1 % nasal spray Place 1 spray into both nostrils 2 (two) times daily. Use in each nostril as directed    budesonide-formoterol (SYMBICORT) 80-4.5 MCG/ACT inhaler Take 2 puffs first thing in am and then another 2 puffs about 12 hours later.    Calcium Carb-Cholecalciferol (CALCIUM/VITAMIN D PO) Take by mouth daily.    cholecalciferol (VITAMIN D3) 25 MCG (1000 UNIT) tablet Take 1 tablet (1,000 Units total) by mouth daily.    Cyanocobalamin (VITAMIN B-12 IJ) Inject as directed every 30 (thirty) days. 02/23/2020: Lf: 02/09/20 DS:30   doxycycline (VIBRA-TABS) 100 MG tablet Take 1 tablet (100 mg total) by mouth  2 (two) times daily.    ELIQUIS 5 MG TABS tablet TAKE 1 TABLET(5 MG) BY MOUTH TWICE DAILY    fluticasone (FLONASE) 50 MCG/ACT nasal spray Place 2 sprays into both nostrils daily.    gabapentin (NEURONTIN) 100 MG capsule 2 CAPSULES AT BEDTIME  loratadine (CLARITIN) 10 MG tablet Take 10 mg by mouth daily.    methocarbamol (ROBAXIN) 500 MG tablet TAKE 1 TABLET BY MOUTH FOUR TIMES DAILY AS NEEDED    MILK THISTLE PO Take by mouth daily.    ondansetron (ZOFRAN-ODT) 4 MG disintegrating tablet Take 1 tablet (4 mg total) by mouth every 8 (eight) hours as needed for nausea or vomiting.    OVER THE COUNTER MEDICATION Premeir protein drink once a day    pantoprazole (PROTONIX) 40 MG tablet Take 1 tablet (40 mg total) by mouth 2 (two) times daily before a meal.    PROAIR HFA 108 (90 Base) MCG/ACT inhaler INHALE 2 PUFFS INTO THE LUNGS EVERY 6 HOURS AS NEEDED FOR WHEEZING OR SHORTNESS OF BREATH    traMADol (ULTRAM) 50 MG tablet Take 1 tablet (50 mg total) by mouth every 6 (six) hours as needed.    ursodiol (ACTIGALL) 500 MG tablet TAKE 1 TABLET BY MOUTH TWICE DAILY    No facility-administered encounter medications on file as of 12/13/2020.    ALLERGIES:  Allergies  Allergen Reactions   Penicillins Anaphylaxis    Has patient had a PCN reaction causing immediate rash, facial/tongue/throat swelling, SOB or lightheadedness with hypotension: yes Has patient had a PCN reaction causing severe rash involving mucus membranes or skin necrosis: No Has patient had a PCN reaction that required hospitalization Yes Has patient had a PCN reaction occurring within the last 10 years: No If all of the above answers are "NO", then may proceed with Cephalosporin use.    Lidocaine Other (See Comments)    Can use topical lidocaine but if ingested causes Hallucinations.     PHYSICAL EXAM:  ECOG PERFORMANCE STATUS: 2 - Symptomatic, <50% confined to bed  There were no vitals filed for this visit. There were no vitals  filed for this visit. Physical Exam Constitutional:      Appearance: Normal appearance.     Comments: Somewhat weak appearing  HENT:     Head: Normocephalic and atraumatic.     Mouth/Throat:     Mouth: Mucous membranes are moist.  Eyes:     Extraocular Movements: Extraocular movements intact.     Pupils: Pupils are equal, round, and reactive to light.  Cardiovascular:     Rate and Rhythm: Normal rate and regular rhythm.     Pulses: Normal pulses.     Heart sounds: Normal heart sounds.  Pulmonary:     Effort: Pulmonary effort is normal.     Breath sounds: Normal breath sounds.  Abdominal:     General: Bowel sounds are normal.     Palpations: Abdomen is soft.     Tenderness: There is no abdominal tenderness.  Musculoskeletal:        General: No swelling.     Right lower leg: No edema.     Left lower leg: No edema.  Lymphadenopathy:     Cervical: No cervical adenopathy.  Skin:    General: Skin is warm and dry.  Neurological:     General: No focal deficit present.     Mental Status: She is alert and oriented to person, place, and time.  Psychiatric:        Mood and Affect: Mood normal.        Behavior: Behavior normal.     LABORATORY DATA:  I have reviewed the labs as listed.  CBC    Component Value Date/Time   WBC 8.9 12/06/2020 0934   RBC 4.62 12/06/2020 0934  HGB 14.7 12/06/2020 0934   HGB 11.8 01/17/2018 1355   HCT 45.1 12/06/2020 0934   HCT 40.8 07/24/2019 0938   PLT 354 12/06/2020 0934   PLT 114 (L) 01/17/2018 1355   MCV 97.6 12/06/2020 0934   MCV 88 01/17/2018 1355   MCH 31.8 12/06/2020 0934   MCHC 32.6 12/06/2020 0934   RDW 12.4 12/06/2020 0934   RDW 13.3 01/17/2018 1355   LYMPHSABS 3.5 12/06/2020 0934   LYMPHSABS 1.1 01/17/2018 1355   MONOABS 0.7 12/06/2020 0934   EOSABS 0.4 12/06/2020 0934   EOSABS 0.1 01/17/2018 1355   BASOSABS 0.1 12/06/2020 0934   BASOSABS 0.0 01/17/2018 1355   CMP Latest Ref Rng & Units 12/06/2020 08/12/2020 05/02/2020   Glucose 70 - 99 mg/dL 79 935(T) 017(B)  BUN 6 - 20 mg/dL 9 9 7   Creatinine 0.44 - 1.00 mg/dL 9.39 0.30  Sodium 135 - 145 mmol/L 136 138 136  Potassium 3.5 - 5.1 mmol/L 4.3 3.9 3.8  Chloride 98 - 111 mmol/L 103 105 106  CO2 22 - 32 mmol/L 25 27 25   Calcium 8.9 - 10.3 mg/dL 9.2 9.4 9.5  Total Protein 6.5 - 8.1 g/dL 7.3 6.9 7.3  Total Bilirubin 0.3 - 1.2 mg/dL 0.3 0.7 0.6  Alkaline Phos 38 - 126 U/L 72 71 77  AST 15 - 41 U/L 18 20 21   ALT 0 - 44 U/L 13 19 15     DIAGNOSTIC IMAGING:  I have independently reviewed the relevant imaging and discussed with the patient.  ASSESSMENT & PLAN: 1.  Portal vein thrombosis - November 2021, patient fell and was found to have splenic rupture with thrombus in the main portal vein and central intrahepatic vein and branches, with edema around the pancreas, as well as fatty liver with early cirrhosis.  She had splenectomy as well as liver biopsy that showed steatohepatitis.  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 - Hypercoagulable work-up was negative - She is stable on Eliquis, reports that she has not missed any doses since her last appointment - Denies easy bruising.  Complains of menorrhagia, but denies other abnormal signs or symptoms of bleeding.   - PLAN: Continue Eliquis indefinitely.    2.  Iron deficiency anemia - Etiology of iron deficiency anemia suspected to be blood loss related to menorrhagia - EGD (11/21/2015): Mallory-Weiss tear, likely cause of hematemesis - Colonoscopy (02/05/2015): Mild diverticulosis and small internal hemorrhoids - She was unable to tolerate oral iron tablets, but received IV Feraheme on 05/14/2020 and 05/21/2020 - She has heavy blood loss secondary to menorrhagia, but denies any signs of GI blood loss  - Her significant fatigue is multifactorial - Most recent labs (12/06/2020): Hgb 14.7, normal CBC; ferritin 70, iron saturation 15% - PLAN: Patient would benefit from IV Feraheme  x1 due to borderline iron deficiency and symptoms of severe fatigue.  We will repeat CBC and iron panel in 3 months.  Continue to recommend that patient speak with her GYN regarding treatment options for menorrhagia  3.  Leukocytosis and thrombocytosis, intermittent - Most recent labs (12/06/2020) shows normal CBC without leukocytosis or thrombocytosis -Intermittent leukocytosis and thrombocytosis are likely secondary to splenectomy - PLAN: Repeat CBC in 3 months.  No indication for further work-up (such as JAK2, flow cytometry, BCR/ABL) unless there is significant deviation from baseline.  4. Right leg varicose veins - Symptomatic varicose veins of right thigh  - Patient was concerned about blood clot, but venous duplex on 08/15/2020  was negative for right lower extremity DVT - PLAN: Referral has been sent to vascular surgery for further work-up and treatment.  5.  Folate deficiency: - Labs (12/06/2020): Normal folate 6.7 - PLAN: Continue daily multivitamin with folic acid.  Repeat folic acid in 6 months.  6.  B12 deficiency: - Labs (12/06/2020): Normal B12 379, normal methylmalonic acid - PLAN: Continue monthly B12 injections at home.    Repeat B12 and methylmalonic acid in 6 months.  7.  Vitamin D deficiency - Labs (12/06/2020) show persistently low vitamin D (29.66) despite starting on vitamin D3 cholecalciferol 1000 units daily - Patient reports that she takes calcium 500 mg/vitamin D3 400 units at home - PLAN: Instructed patient to increase vitamin D to 2000 units daily.  We will repeat vitamin D in 3 months.  8.    Primary biliary cirrhosis and chronic abdominal pain: - Chronic and multifactorial secondary to primary biliary cirrhosis and splenectomy with postoperative changes (small irregular collection of left upper quadrant hematoma or seroma) - Follows with gastroenterology, and is on ursodiol - PLAN:  Continue follow-up with gastroenterology.     PLAN SUMMARY & DISPOSITION: -  Feraheme x1 - Labs (CBC, iron, ferritin, vitamin D) in 3 months - Phone visit after labs   All questions were answered. The patient knows to call the clinic with any problems, questions or concerns.  Medical decision making: Moderate  Time spent on visit: I spent 20 minutes counseling the patient face to face. The total time spent in the appointment was 30 minutes and more than 50% was on counseling.   Carnella Guadalajara, PA-C  12/13/2020 10:10 AM

## 2020-12-13 ENCOUNTER — Ambulatory Visit (HOSPITAL_COMMUNITY)
Admission: RE | Admit: 2020-12-13 | Discharge: 2020-12-13 | Disposition: A | Discharge status: Home / Self Care | Payer: Medicaid Other | Source: Ambulatory Visit | Attending: Internal Medicine | Admitting: Internal Medicine

## 2020-12-13 ENCOUNTER — Encounter (HOSPITAL_COMMUNITY): Payer: Self-pay | Admitting: Physician Assistant

## 2020-12-13 ENCOUNTER — Other Ambulatory Visit: Payer: Self-pay

## 2020-12-13 ENCOUNTER — Inpatient Hospital Stay (HOSPITAL_BASED_OUTPATIENT_CLINIC_OR_DEPARTMENT_OTHER): Payer: Medicaid Other | Admitting: Physician Assistant

## 2020-12-13 VITALS — BP 111/72 | HR 86 | Temp 98.6°F | Resp 16 | Wt 129.5 lb

## 2020-12-13 DIAGNOSIS — J449 Chronic obstructive pulmonary disease, unspecified: Secondary | ICD-10-CM

## 2020-12-13 DIAGNOSIS — I81 Portal vein thrombosis: Secondary | ICD-10-CM | POA: Diagnosis not present

## 2020-12-13 DIAGNOSIS — E559 Vitamin D deficiency, unspecified: Secondary | ICD-10-CM | POA: Diagnosis not present

## 2020-12-13 DIAGNOSIS — D509 Iron deficiency anemia, unspecified: Secondary | ICD-10-CM

## 2020-12-13 NOTE — Patient Instructions (Signed)
[facility] at [facility] Discharge Instructions  You were seen today by Rojelio Brenner PA-C for your history of blood clots .  You should continue to take Eliquis long-term due to your history of portal vein thrombosis.  Since you are continuing to have heavy menstrual cycles, which may partially be due to the Eliquis, I recommend that you speak with your gynecologist as soon as possible.  We will schedule you for IV iron x1 due to your iron deficiency.  You will need to INCREASE your vitamin D to 2000 units daily (available over-the-counter).  We will recheck your labs in 3 months and see you for phone visit after labs.   Thank you for choosing [facility] at [facility] to provide your oncology and hematology care.  To afford each patient quality time with our provider, please arrive at least 15 minutes before your scheduled appointment time.   If you have a lab appointment with the Cancer Center please come in thru the Main Entrance and check in at the main information desk.  You need to re-schedule your appointment should you arrive 10 or more minutes late.  We strive to give you quality time with our providers, and arriving late affects you and other patients whose appointments are after yours.  Also, if you no show three or more times for appointments you may be dismissed from the clinic at the providers discretion.     Again, thank you for choosing [facility].  Our hope is that these requests will decrease the amount of time that you wait before being seen by our physicians.       _____________________________________________________________  Should you have questions after your visit to [facility], please contact our office at [phone] and follow the prompts.  Our office hours are 8:00 a.m. and 4:30 p.m. Monday - Friday.  Please note that voicemails left after 4:00 p.m. may not be returned until the  following business day.  We are closed weekends and major holidays.  You do have access to a nurse 24-7, just call the main number to the clinic [phone] and do not press any options, hold on the line and a nurse will answer the phone.    For prescription refill requests, have your pharmacy contact our office and allow 72 hours.    Due to Covid, you will need to wear a mask upon entering the hospital. If you do not have a mask, a mask will be given to you at the Main Entrance upon arrival. For doctor visits, patients may have 1 support person age 65 or older with them. For treatment visits, patients can not have anyone with them due to social distancing guidelines and our immunocompromised population.

## 2020-12-17 ENCOUNTER — Other Ambulatory Visit: Payer: Self-pay

## 2020-12-17 ENCOUNTER — Inpatient Hospital Stay (HOSPITAL_COMMUNITY): Payer: Medicaid Other

## 2020-12-17 VITALS — BP 94/53 | HR 64 | Temp 97.9°F | Resp 18

## 2020-12-17 DIAGNOSIS — D509 Iron deficiency anemia, unspecified: Secondary | ICD-10-CM | POA: Diagnosis not present

## 2020-12-17 MED ORDER — ACETAMINOPHEN 325 MG PO TABS
650.0000 mg | ORAL_TABLET | Freq: Once | ORAL | Status: AC
  Administered 2020-12-17: 650 mg via ORAL
  Filled 2020-12-17: qty 2

## 2020-12-17 MED ORDER — SODIUM CHLORIDE 0.9 % IV SOLN
510.0000 mg | Freq: Once | INTRAVENOUS | Status: AC
  Administered 2020-12-17: 510 mg via INTRAVENOUS
  Filled 2020-12-17: qty 510

## 2020-12-17 MED ORDER — SODIUM CHLORIDE 0.9 % IV SOLN: Freq: Once | INTRAVENOUS | Status: AC

## 2020-12-17 MED ORDER — LORATADINE 10 MG PO TABS
10.0000 mg | ORAL_TABLET | Freq: Once | ORAL | Status: AC
  Administered 2020-12-17: 10 mg via ORAL
  Filled 2020-12-17: qty 1

## 2020-12-17 NOTE — Patient Instructions (Signed)
[facility]  Discharge Instructions: Thank you for choosing [facility] to provide your oncology and hematology care.  If you have a lab appointment with the Cancer Center, please come in thru the Main Entrance and check in at the main information desk.  Wear comfortable clothing and clothing appropriate for easy access to any Portacath or PICC line.   We strive to give you quality time with your provider. You may need to reschedule your appointment if you arrive late (15 or more minutes).  Arriving late affects you and other patients whose appointments are after yours.  Also, if you miss three or more appointments without notifying the office, you may be dismissed from the clinic at the provider's discretion.      For prescription refill requests, have your pharmacy contact our office and allow 72 hours for refills to be completed.    Today you received Feraheme IV iron infusion.     BELOW ARE SYMPTOMS THAT SHOULD BE REPORTED IMMEDIATELY: *FEVER GREATER THAN 100.4 F (38 C) OR HIGHER *CHILLS OR SWEATING *NAUSEA AND VOMITING THAT IS NOT CONTROLLED WITH YOUR NAUSEA MEDICATION *UNUSUAL SHORTNESS OF BREATH *UNUSUAL BRUISING OR BLEEDING *URINARY PROBLEMS (pain or burning when urinating, or frequent urination) *BOWEL PROBLEMS (unusual diarrhea, constipation, pain near the anus) TENDERNESS IN MOUTH AND THROAT WITH OR WITHOUT PRESENCE OF ULCERS (sore throat, sores in mouth, or a toothache) UNUSUAL RASH, SWELLING OR PAIN  UNUSUAL VAGINAL DISCHARGE OR ITCHING   Items with * indicate a potential emergency and should be followed up as soon as possible or go to the Emergency Department if any problems should occur.  Please show the CHEMOTHERAPY ALERT CARD or IMMUNOTHERAPY ALERT CARD at check-in to the Emergency Department and triage nurse.  Should you have questions after your visit or need to cancel or reschedule your appointment, please contact [facility]  [phone]  and follow the prompts.  Office hours are 8:00 a.m. to 4:30 p.m. Monday - Friday. Please note that voicemails left after 4:00 p.m. may not be returned until the following business day.  We are closed weekends and major holidays. You have access to a nurse at all times for urgent questions. Please call the main number to the clinic [phone] and follow the prompts.  For any non-urgent questions, you may also contact your provider using MyChart. We now offer e-Visits for anyone 18 and older to request care online for non-urgent symptoms. For details visit [email or web address].   Also download the MyChart app! Go to the app store, search "MyChart", open the app, select [facility], and log in with your MyChart username and password.  Due to Covid, a mask is required upon entering the hospital/clinic. If you do not have a mask, one will be given to you upon arrival. For doctor visits, patients may have 1 support person aged 18 or older with them. For treatment visits, patients cannot have anyone with them due to current Covid guidelines and our immunocompromised population.  

## 2020-12-17 NOTE — Progress Notes (Signed)
Pt presents today for Feraheme IV iron iron per provider's order. Vital signs stable and pt voiced no new complaints at this time.   Peripheral IV started with good blood return pre and post infusion.  Feraheme given today per MD orders. Tolerated infusion without adverse affects. Vital signs stable. No complaints at this time. Discharged from clinic ambulatory with cane in stable condition. Alert and oriented x 3. F/U with [facility] as scheduled.

## 2020-12-18 ENCOUNTER — Ambulatory Visit: Payer: Medicaid Other | Admitting: Internal Medicine

## 2020-12-18 ENCOUNTER — Ambulatory Visit (HOSPITAL_COMMUNITY)
Admission: RE | Admit: 2020-12-18 | Discharge: 2020-12-18 | Disposition: A | Discharge status: Home / Self Care | Payer: Medicaid Other | Source: Ambulatory Visit | Attending: Internal Medicine | Admitting: Internal Medicine

## 2020-12-18 ENCOUNTER — Encounter: Payer: Self-pay | Admitting: Internal Medicine

## 2020-12-18 VITALS — BP 117/75 | HR 77 | Temp 97.9°F | Resp 18 | Ht 64.0 in | Wt 132.0 lb

## 2020-12-18 DIAGNOSIS — M542 Cervicalgia: Secondary | ICD-10-CM | POA: Diagnosis present

## 2020-12-18 DIAGNOSIS — F4310 Post-traumatic stress disorder, unspecified: Secondary | ICD-10-CM | POA: Diagnosis not present

## 2020-12-18 DIAGNOSIS — M25511 Pain in right shoulder: Secondary | ICD-10-CM | POA: Diagnosis not present

## 2020-12-18 DIAGNOSIS — F332 Major depressive disorder, recurrent severe without psychotic features: Secondary | ICD-10-CM

## 2020-12-18 DIAGNOSIS — K743 Primary biliary cirrhosis: Secondary | ICD-10-CM | POA: Diagnosis not present

## 2020-12-18 DIAGNOSIS — Z9189 Other specified personal risk factors, not elsewhere classified: Secondary | ICD-10-CM

## 2020-12-18 DIAGNOSIS — M50322 Other cervical disc degeneration at C5-C6 level: Secondary | ICD-10-CM | POA: Diagnosis not present

## 2020-12-18 DIAGNOSIS — M25512 Pain in left shoulder: Secondary | ICD-10-CM | POA: Diagnosis not present

## 2020-12-18 DIAGNOSIS — I81 Portal vein thrombosis: Secondary | ICD-10-CM | POA: Diagnosis not present

## 2020-12-18 DIAGNOSIS — J0110 Acute frontal sinusitis, unspecified: Secondary | ICD-10-CM

## 2020-12-18 MED ORDER — SERTRALINE HCL 50 MG PO TABS: 50.0000 mg | ORAL_TABLET | Freq: Every day | ORAL | 3 refills | Status: DC

## 2020-12-18 NOTE — Assessment & Plan Note (Signed)
Snoring and daytime fatigue Deemed to be at high risk of OSA Ordered home sleep study.

## 2020-12-18 NOTE — Assessment & Plan Note (Signed)
Had Neuropsychological evaluation, visit note reviewed Started Zoloft 50 mg daily Referred to psychiatry
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
## 2020-12-18 NOTE — Assessment & Plan Note (Signed)
On Ursodiol Follows up with GI On vitamin supplements 

## 2020-12-18 NOTE — Assessment & Plan Note (Signed)
Concern for DDD of cervical spine, check X-ray cervical spine If persistent, will refer to spine surgery

## 2020-12-18 NOTE — Assessment & Plan Note (Signed)
On Eliquis F/u with GI and Heme/Onc. 
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
## 2020-12-18 NOTE — Patient Instructions (Signed)
Please start taking Zoloft as prescribed.  You are being referred to Psychiatrist for depression and PTSD.  Please continue to take other medications as prescribed.  You are being scheduled for home sleep study.

## 2020-12-19 ENCOUNTER — Encounter: Payer: Self-pay | Admitting: Internal Medicine

## 2020-12-19 MED ORDER — AZITHROMYCIN 250 MG PO TABS: ORAL_TABLET | ORAL | 0 refills | Status: AC

## 2020-12-19 NOTE — Addendum Note (Signed)
Addended byTrena Platt on: 12/19/2020 11:26 AM   Modules accepted: Orders

## 2020-12-23 ENCOUNTER — Other Ambulatory Visit: Payer: Self-pay | Admitting: *Deleted

## 2020-12-23 DIAGNOSIS — M797 Fibromyalgia: Secondary | ICD-10-CM

## 2020-12-26 ENCOUNTER — Other Ambulatory Visit: Payer: Self-pay | Admitting: Internal Medicine

## 2020-12-26 DIAGNOSIS — J0110 Acute frontal sinusitis, unspecified: Secondary | ICD-10-CM

## 2020-12-26 MED ORDER — MOXIFLOXACIN HCL 400 MG PO TABS: 400.0000 mg | ORAL_TABLET | Freq: Every day | ORAL | 0 refills | Status: AC

## 2020-12-27 ENCOUNTER — Other Ambulatory Visit: Payer: Self-pay | Admitting: *Deleted

## 2020-12-27 DIAGNOSIS — M542 Cervicalgia: Secondary | ICD-10-CM

## 2021-01-02 ENCOUNTER — Encounter (HOSPITAL_COMMUNITY): Payer: Self-pay | Admitting: Psychiatry

## 2021-01-02 ENCOUNTER — Encounter (HOSPITAL_COMMUNITY): Payer: Self-pay | Admitting: Hematology

## 2021-01-02 ENCOUNTER — Ambulatory Visit (INDEPENDENT_AMBULATORY_CARE_PROVIDER_SITE_OTHER): Payer: Medicaid Other | Admitting: Psychiatry

## 2021-01-02 ENCOUNTER — Other Ambulatory Visit: Payer: Self-pay

## 2021-01-02 DIAGNOSIS — F4310 Post-traumatic stress disorder, unspecified: Secondary | ICD-10-CM

## 2021-01-02 DIAGNOSIS — F321 Major depressive disorder, single episode, moderate: Secondary | ICD-10-CM | POA: Diagnosis not present
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
## 2021-01-07 ENCOUNTER — Other Ambulatory Visit: Payer: Self-pay

## 2021-01-07 ENCOUNTER — Encounter: Payer: Self-pay | Admitting: Gastroenterology

## 2021-01-07 ENCOUNTER — Encounter: Payer: Self-pay | Admitting: *Deleted

## 2021-01-07 ENCOUNTER — Ambulatory Visit: Payer: Medicaid Other | Admitting: Gastroenterology

## 2021-01-07 VITALS — BP 104/60 | HR 66 | Temp 97.5°F | Ht 64.0 in | Wt 129.0 lb

## 2021-01-07 DIAGNOSIS — K219 Gastro-esophageal reflux disease without esophagitis: Secondary | ICD-10-CM

## 2021-01-07 DIAGNOSIS — R1010 Upper abdominal pain, unspecified: Secondary | ICD-10-CM | POA: Diagnosis not present

## 2021-01-07 DIAGNOSIS — R634 Abnormal weight loss: Secondary | ICD-10-CM | POA: Diagnosis not present

## 2021-01-07 DIAGNOSIS — K743 Primary biliary cirrhosis: Secondary | ICD-10-CM | POA: Diagnosis not present

## 2021-01-07 DIAGNOSIS — R1310 Dysphagia, unspecified: Secondary | ICD-10-CM | POA: Diagnosis not present

## 2021-01-07 DIAGNOSIS — R1013 Epigastric pain: Secondary | ICD-10-CM

## 2021-01-07 NOTE — Patient Instructions (Addendum)
Please have your labs between 8am and 10am at [facility] Nothing to eat or drink after midnight the night before.  Xray of esophagus and stomach as planned. Continue pantoprazole and Urso as before.
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
## 2021-01-08 ENCOUNTER — Encounter: Payer: Self-pay | Admitting: Internal Medicine

## 2021-01-13 ENCOUNTER — Ambulatory Visit (HOSPITAL_COMMUNITY): Payer: Medicaid Other | Attending: Internal Medicine | Admitting: Physical Therapy

## 2021-01-13 ENCOUNTER — Encounter (HOSPITAL_COMMUNITY): Payer: Self-pay | Admitting: Physical Therapy

## 2021-01-13 ENCOUNTER — Other Ambulatory Visit: Payer: Self-pay

## 2021-01-13 DIAGNOSIS — M6281 Muscle weakness (generalized): Secondary | ICD-10-CM | POA: Diagnosis not present

## 2021-01-13 DIAGNOSIS — R262 Difficulty in walking, not elsewhere classified: Secondary | ICD-10-CM | POA: Diagnosis not present

## 2021-01-13 DIAGNOSIS — M542 Cervicalgia: Secondary | ICD-10-CM

## 2021-01-13 DIAGNOSIS — G8929 Other chronic pain: Secondary | ICD-10-CM | POA: Diagnosis not present

## 2021-01-13 DIAGNOSIS — M545 Low back pain, unspecified: Secondary | ICD-10-CM | POA: Insufficient documentation

## 2021-01-13 NOTE — Therapy (Signed)
[facility] [address] [place], [place], [address] Phone: [phone]   Fax:  [phone]  Physical Therapy Evaluation  Patient Details  Name: Kara Stewart MRN: [id number] Date of Birth: 11-09-1975 Referring Provider (PT): Anabel Halon   Encounter Date: 01/13/2021   PT End of Session - 01/13/21 1050     Visit Number 1    Number of Visits 12    Date for PT Re-Evaluation 02/24/21    Authorization Type [place] medicaid healthyblue 7 previously used this year    Authorization Time Period check auth    Authorization - Visit Number 1    Authorization - Number of Visits 12    Progress Note Due on Visit 10    PT Start Time 1049    PT Stop Time 1120    PT Time Calculation (min) 31 min    Activity Tolerance Patient tolerated treatment well    Behavior During Therapy [facility] for tasks assessed/performed             Past Medical History:  Diagnosis Date   Abdominal pain, epigastric 05/29/2020   Alcohol abuse, in remission    Alcoholic hepatitis with ascites 10/05/2016   Has continued to abstain from ETOH since May 2018, known severe hepatic steatosis, HSM, possible early cirrhosis. Treating with URSO due to elevated AMA. LFTs slowly improving. Concern for recurrent non-tense ascites on exam: patient feels uncomfortable and requesting paracentesis. This is likely   Asthma due to seasonal allergies 04/17/2020   Chronic liver disease 11/16/2017   Deep vein thrombosis of portal vein 03/06/2020   Dysphagia 05/29/2020   Fibromyalgia    Generalized anxiety disorder with panic attacks    GERD (gastroesophageal reflux disease) 07/21/2012   Hiatal hernia    History of alcohol abuse 04/26/2015   History of colitis 11/29/2014   History of marijuana use 01/20/2015   Hypertriglyceridemia    Hypothyroid    Iritis    frequent   Iron deficiency anemia 05/13/2020   Macrocytic anemia 03/06/2020   Major depressive disorder 09/10/2020   Ovarian cyst     Polyarthralgia 03/06/2020   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   PTSD (post-traumatic stress disorder) 09/10/2020   S/P closed fracture of wrist 03/06/2020   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   SVT (supraventricular tachycardia)    last issue 12/2017   Thyroid nodule 01/11/2014    Past Surgical History:  Procedure Laterality Date   BIOPSY N/A 02/05/2015   Procedure: BIOPSY;  Surgeon: West Bali, MD;  Location: [facility];  Service: Endoscopy;  Laterality: N/A;   COLONOSCOPY     COLONOSCOPY WITH PROPOFOL N/A 02/05/2015   TTS:VXBLT HH/mild diverticulosis   ESOPHAGEAL DILATION N/A 11/21/2015   Procedure: ESOPHAGEAL DILATION;  Surgeon: Corbin Ade, MD;  [facility];  Service: Endoscopy;  Laterality: N/A;   ESOPHAGOGASTRODUODENOSCOPY  11/06/2011   [name]: MILD Esophagitis/PATENT ESOPHAGEAL Stricture/  Moderate gastritis. Bx no.hpylori or celiac, +gastritis   ESOPHAGOGASTRODUODENOSCOPY (EGD) WITH PROPOFOL N/A 03/20/2014   [name]: 1. Mild esophagitis & distal esophagela stricture. 2. small hiatal hernia 3. moderate non-erosive gastritis and mild duodenits   ESOPHAGOGASTRODUODENOSCOPY (EGD) WITH PROPOFOL N/A 11/21/2015   Dr. Jena Gauss: LA grade B esophagitis, MW tear likely source of hematemesis   LAPAROSCOPIC TUBAL LIGATION Bilateral 09/14/2018   Procedure: LAPAROSCOPIC TUBAL LIGATION;  Surgeon: Allie Bossier, MD;  Location: [facility];  Service: Gynecology;  Laterality: Bilateral;   LIVER BIOPSY  02/23/2020   Procedure: OPEN  LIVER BIOPSY;  Surgeon: Fritzi Mandes, MD;  Location: [facility] OR;  Service: General;;   SAVORY DILATION N/A 03/20/2014   Procedure: Gaspar Bidding DILATION;  Surgeon: West Bali, MD;  Location: [facility];  Service: Endoscopy;  Laterality: N/A;  dilated with # 12.8, 14,15,16   SPLENECTOMY, TOTAL N/A 02/23/2020   Procedure: SPLENECTOMY;  Surgeon: Fritzi Mandes, MD;  Location: [facility] OR;  Service: General;  Laterality: N/A;   TOOTH EXTRACTION      There were no vitals filed for  this visit.    Subjective Assessment - 01/13/21 1057     Subjective States that she was coming to PT this year for 7 visits in her low back. Reports that she didn't notice any improvement in her lower back. States she still has a lot of pain all over. States that her pain in her back comes and goes. States that she wears her back brace 95% of the time. States that she wears it to keep her disc from budging back out. States that she had a fall last year where she had her spleen ruptured and states that she was in the hospital for a couple of weeks and then was limited in her overall mobility secondary to large incision in the stomach. States that she feels weak and is still having lots of pain. Reports she wasn't using the cane prior to her accident. Current pain level is 7/10 with it primarily in her lower back and in her right Upper trap. States that her right side seems to hurt the worse and that is her stronger side. States that she has tingling in her right hand and that her neck pain is worse overall.    Patient Stated Goals would be able to make it through the day with normal household activities and can't wash dishes all at once.    Currently in Pain? Yes    Pain Score 7     Pain Location Back    Pain Orientation Lower    Pain Descriptors / Indicators Aching    Pain Type Chronic pain                [facility] PT Assessment - 01/13/21 0001       Assessment   Medical Diagnosis neck pain and back pain    Referring Provider (PT) Anabel Halon    Prior Therapy yes for low back      Balance Screen   Has the patient fallen in the past 6 months Yes    How many times? 4    Has the patient had a decrease in activity level because of a fear of falling?  Yes    Is the patient reluctant to leave their home because of a fear of falling?  Yes      Posture/Postural Control   Posture/Postural Control Postural limitations    Postural Limitations Rounded Shoulders;Forward head;Posterior pelvic  tilt;Flexed trunk      AROM   AROM Assessment Site Cervical;Shoulder    Right/Left Shoulder Left;Right    Right Shoulder Flexion --   WNL but pain in shoulders   Right Shoulder ABduction --   WNL but pain in shoulders   Left Shoulder Flexion --   WNL but pain in shoulders   Left Shoulder ABduction --   WNL but pain in shoulders   Cervical Flexion WNL pain on the neck    Cervical Extension WNL pain on right side    Cervical - Right Rotation  50   pain on right side   Cervical - Left Rotation 30 pain in right arm and refering to elbow      Ambulation/Gait   Ambulation/Gait Yes    Ambulation/Gait Assistance 6: Modified independent (Device/Increase time)    Ambulation Distance (Feet) 400 Feet    Assistive device Straight cane    Gait Pattern Antalgic    Ambulation Surface Level;Indoor    Gait Comments                        Objective measurements completed on examination: See above findings.       [facility] Adult PT Treatment/Exercise - 01/13/21 0001       Posture/Postural Control   Posture Comments sitting tall posture 5 second holds x5                     PT Education - 01/13/21 1142     Education Details on current presentation, on HEP, on posture and difference with active vs passive postures.    Person(s) Educated Patient    Methods Explanation    Comprehension Verbalized understanding              PT Short Term Goals - 01/13/21 1126       PT SHORT TERM GOAL #1   Title Patient will report at least 25% improvement in symptoms for improved quality of life.    Time 3    Period Weeks    Status New    Target Date 02/03/21      PT SHORT TERM GOAL #2   Title Patient will be independent in self management strategies to improve quality of life and functional outcomes.    Time 3    Period Weeks    Status New    Target Date 02/03/21      PT SHORT TERM GOAL #3   Title Patient will be able to rotate head at least 45 degrees bilaterally     Time 3    Period Weeks    Status New    Target Date 02/03/21               PT Long Term Goals - 01/13/21 1139       PT LONG TERM GOAL #1   Title Patient will be able to sit with good posture for at least 45 seconds to demonstrate improved sitting posture.    Baseline currently 5 seconds    Time 6    Period Weeks    Status New    Target Date 02/24/21      PT LONG TERM GOAL #2   Title Patient will be able to demonstrate good TRA activation in sitting and standign to improve standing tolerance for chores.    Time 6    Period Weeks    Status New    Target Date 02/24/21      PT LONG TERM GOAL #3   Title Patient will report at least 50% improvement in overall symptoms and/or function to demonstrate improved functional mobility    Time 6    Period Weeks    Status New    Target Date 02/24/21                    Plan - 01/13/21 1101     Clinical Impression Statement Patient is a 45 y.o. female who presents to physical therapy with complaint of neck and back pain  after a fall that resulted in rupturing her spleen and was hospitalized for a few weeks resulting in generalized weakness and pain throughout. Patient demonstrates decreased strength, ROM restriction, reduced flexibility, increased tenderness to palpation and postural abnormalities which are likely contributing to symptoms of pain and are negatively impacting patient ability to perform ADLs. Patient will benefit from skilled physical therapy services to address these deficits to reduce pain and improve level of function with ADLs    Personal Factors and Comorbidities Comorbidity 1;Time since onset of injury/illness/exacerbation    Comorbidities abdominal surgery    Examination-Activity Limitations Bend;Carry;Squat;Stairs;Lift;Locomotion Level;Transfers    Examination-Participation Restrictions Cleaning;Community Activity;Meal Prep;Laundry    Stability/Clinical Decision Making Evolving/Moderate complexity     Clinical Decision Making Moderate    Rehab Potential Fair    PT Frequency 2x / week    PT Duration 6 weeks    PT Treatment/Interventions ADLs/Self Care Home Management;Aquatic Therapy;Cryotherapy;Electrical Stimulation;DME Instruction;Traction;Moist Heat;Gait training;Stair training;Functional mobility training;Therapeutic activities;Therapeutic exercise;Balance training;Patient/family education;Neuromuscular re-education;Manual techniques;Passive range of motion;Taping;Energy conservation;Dry needling;Spinal Manipulations;Joint Manipulations    PT Next Visit Plan supine TRA activation, chin tucks, neck ROM, manual/modalities as indicated for pain.    PT Home Exercise Plan posture             Patient will benefit from skilled therapeutic intervention in order to improve the following deficits and impairments:  Abnormal gait, Decreased activity tolerance, Decreased balance, Decreased mobility, Decreased endurance, Decreased strength, Difficulty walking, Impaired perceived functional ability, Postural dysfunction, Improper body mechanics, Pain, Decreased range of motion, Decreased scar mobility  Visit Diagnosis: Cervicalgia  Difficulty in walking, not elsewhere classified  Chronic midline low back pain, unspecified whether sciatica present  Muscle weakness (generalized)     Problem List Patient Active Problem List   Diagnosis Date Noted   Upper abdominal pain 01/07/2021   Loss of weight 01/07/2021   At risk for sleep apnea 12/18/2020   Neck pain 12/18/2020   Asthmatic bronchitis , chronic (HCC) 10/17/2020   Left maxillary sinusitis 10/17/2020   Chronic venous insufficiency 10/08/2020   Major depressive disorder 09/10/2020   PTSD (post-traumatic stress disorder) 09/10/2020   Fibromyalgia    Leg swelling 08/15/2020   Tobacco abuse 08/15/2020   Abdominal pain, epigastric 05/29/2020   Dysphagia 05/29/2020   Iron deficiency anemia 05/13/2020   Asthma due to seasonal allergies  04/17/2020   Constipation 03/12/2020   Deep vein thrombosis of portal vein 03/06/2020   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   Macrocytic anemia 03/06/2020   Polyarthralgia 03/06/2020   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   Chronic liver disease 11/16/2017   Alcoholic hepatitis with ascites 10/05/2016   History of marijuana use 01/20/2015   Abnormal CT scan, colon 01/08/2015   Generalized anxiety disorder with panic attacks    Thyroid nodule 01/11/2014   SVT (supraventricular tachycardia) (HCC) 09/07/2012   GERD (gastroesophageal reflux disease) 07/21/2012   Alcohol abuse, in remission    RUQ pain 10/28/2011    11:48 AM, 01/13/21 Tereasa Coop, DPT Physical Therapy with [facility]  [phone] office   [facility] [address] [place], [place], [address] Phone: [phone]   Fax:  [phone]  Name: Kara Stewart MRN: [id number] Date of Birth: Dec 02, 1975

## 2021-01-15 ENCOUNTER — Other Ambulatory Visit: Payer: Self-pay | Admitting: Internal Medicine

## 2021-01-15 ENCOUNTER — Encounter (HOSPITAL_COMMUNITY): Payer: Self-pay | Admitting: Physical Therapy

## 2021-01-15 ENCOUNTER — Other Ambulatory Visit: Payer: Self-pay

## 2021-01-15 ENCOUNTER — Other Ambulatory Visit: Payer: Self-pay | Admitting: Gastroenterology

## 2021-01-15 ENCOUNTER — Ambulatory Visit (HOSPITAL_COMMUNITY): Payer: Medicaid Other | Admitting: Physical Therapy

## 2021-01-15 DIAGNOSIS — G8929 Other chronic pain: Secondary | ICD-10-CM

## 2021-01-15 DIAGNOSIS — R262 Difficulty in walking, not elsewhere classified: Secondary | ICD-10-CM | POA: Diagnosis not present

## 2021-01-15 DIAGNOSIS — M545 Low back pain, unspecified: Secondary | ICD-10-CM | POA: Diagnosis not present

## 2021-01-15 DIAGNOSIS — M6281 Muscle weakness (generalized): Secondary | ICD-10-CM

## 2021-01-15 DIAGNOSIS — M542 Cervicalgia: Secondary | ICD-10-CM

## 2021-01-15 NOTE — Patient Instructions (Signed)
Access Code: [email or web address] URL: [email or web address] Date: 01/15/2021 Prepared by: Georges Lynch  Exercises Supine Transversus Abdominis Bracing - Hands on Ground - 3 x daily - 7 x weekly - 1 sets - 10 reps - 3 second hold Supine Cervical Retraction with Towel - 3 x daily - 7 x weekly - 1 sets - 10 reps - 3 second hold Supine Scapular Retraction - 3 x daily - 7 x weekly - 1 sets - 10 reps - 3 second hold

## 2021-01-15 NOTE — Therapy (Signed)
[facility] [address] [place], [place], [address] Phone: [phone]   Fax:  [phone]  Physical Therapy Treatment  Patient Details  Name: Kara Stewart MRN: [id number] Date of Birth: 01-Mar-1976 Referring Provider (PT): Anabel Halon   Encounter Date: 01/15/2021   PT End of Session - 01/15/21 1051     Visit Number 2    Number of Visits 12    Date for PT Re-Evaluation 02/24/21    Authorization Type [place] medicaid healthyblue 7 previously used this year    Authorization Time Period pending    Authorization - Visit Number 2    Authorization - Number of Visits 12    Progress Note Due on Visit 10    PT Start Time 1039    PT Stop Time 1113    PT Time Calculation (min) 34 min    Activity Tolerance Patient limited by pain    Behavior During Therapy [facility] for tasks assessed/performed             Past Medical History:  Diagnosis Date   Abdominal pain, epigastric 05/29/2020   Alcohol abuse, in remission    Alcoholic hepatitis with ascites 10/05/2016   Has continued to abstain from ETOH since May 2018, known severe hepatic steatosis, HSM, possible early cirrhosis. Treating with URSO due to elevated AMA. LFTs slowly improving. Concern for recurrent non-tense ascites on exam: patient feels uncomfortable and requesting paracentesis. This is likely   Asthma due to seasonal allergies 04/17/2020   Chronic liver disease 11/16/2017   Deep vein thrombosis of portal vein 03/06/2020   Dysphagia 05/29/2020   Fibromyalgia    Generalized anxiety disorder with panic attacks    GERD (gastroesophageal reflux disease) 07/21/2012   Hiatal hernia    History of alcohol abuse 04/26/2015   History of colitis 11/29/2014   History of marijuana use 01/20/2015   Hypertriglyceridemia    Hypothyroid    Iritis    frequent   Iron deficiency anemia 05/13/2020   Macrocytic anemia 03/06/2020   Major depressive disorder 09/10/2020   Ovarian cyst    Polyarthralgia  03/06/2020   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   PTSD (post-traumatic stress disorder) 09/10/2020   S/P closed fracture of wrist 03/06/2020   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   SVT (supraventricular tachycardia)    last issue 12/2017   Thyroid nodule 01/11/2014    Past Surgical History:  Procedure Laterality Date   BIOPSY N/A 02/05/2015   Procedure: BIOPSY;  Surgeon: West Bali, MD;  Location: [facility];  Service: Endoscopy;  Laterality: N/A;   COLONOSCOPY     COLONOSCOPY WITH PROPOFOL N/A 02/05/2015   DPO:EUMPN HH/mild diverticulosis   ESOPHAGEAL DILATION N/A 11/21/2015   Procedure: ESOPHAGEAL DILATION;  Surgeon: Corbin Ade, MD;  [facility];  Service: Endoscopy;  Laterality: N/A;   ESOPHAGOGASTRODUODENOSCOPY  11/06/2011   [name]: MILD Esophagitis/PATENT ESOPHAGEAL Stricture/  Moderate gastritis. Bx no.hpylori or celiac, +gastritis   ESOPHAGOGASTRODUODENOSCOPY (EGD) WITH PROPOFOL N/A 03/20/2014   [name]: 1. Mild esophagitis & distal esophagela stricture. 2. small hiatal hernia 3. moderate non-erosive gastritis and mild duodenits   ESOPHAGOGASTRODUODENOSCOPY (EGD) WITH PROPOFOL N/A 11/21/2015   Dr. Jena Gauss: LA grade B esophagitis, MW tear likely source of hematemesis   LAPAROSCOPIC TUBAL LIGATION Bilateral 09/14/2018   Procedure: LAPAROSCOPIC TUBAL LIGATION;  Surgeon: Allie Bossier, MD;  Location: [facility];  Service: Gynecology;  Laterality: Bilateral;   LIVER BIOPSY  02/23/2020   Procedure: OPEN LIVER  BIOPSY;  Surgeon: Fritzi Mandes, MD;  Location: [facility] OR;  Service: General;;   SAVORY DILATION N/A 03/20/2014   Procedure: Gaspar Bidding DILATION;  Surgeon: West Bali, MD;  Location: [facility];  Service: Endoscopy;  Laterality: N/A;  dilated with # 12.8, 14,15,16   SPLENECTOMY, TOTAL N/A 02/23/2020   Procedure: SPLENECTOMY;  Surgeon: Fritzi Mandes, MD;  Location: [facility] OR;  Service: General;  Laterality: N/A;   TOOTH EXTRACTION      There were no vitals filed for this visit.    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 She is still having neck and leg pain. She feels unable to do a lot of daily activity due to weakness and decreased activity tolerance.    Patient Stated Goals would be able to make it through the day with normal household activities and can't wash dishes all at once.    Currently in Pain? Yes    Pain Score 7     Pain Location Neck    Pain Orientation Posterior    Pain Descriptors / Indicators Aching    Pain Type Chronic pain                               [facility] Adult PT Treatment/Exercise - 01/15/21 0001       Lumbar Exercises: Supine   Ab Set 15 reps;5 seconds    Other Supine Lumbar Exercises chin tuck x 15 (increased neck pain) , supine scapular retraction x 15 (increased LT shoulder pain)    Other Supine Lumbar Exercises diaphragmatic breathing x10 breaths      Manual Therapy   Manual Therapy Manual Traction;Soft tissue mobilization    Manual therapy comments completed seperate from other intervnetions    Soft tissue mobilization Attempted STM to cervical paraspinals and sub occipitals, poorly tolerated, notes HA and feeling "spinny headed"    Manual Traction attempted manual cervical traction, poorly tolerated (noting increased scapular and mid back "pulling" pain)                       PT Short Term Goals - 01/13/21 1126       PT SHORT TERM GOAL #1   Title Patient will report at least 25% improvement in symptoms for improved quality of life.    Time 3    Period Weeks    Status New    Target Date 02/03/21      PT SHORT TERM GOAL #2   Title Patient will be independent in self management strategies to improve quality of life and functional outcomes.    Time 3    Period Weeks    Status New    Target Date 02/03/21      PT SHORT TERM GOAL #3   Title Patient will be able to rotate head at least 45 degrees  bilaterally    Time 3    Period Weeks    Status New    Target Date 02/03/21               PT Long Term Goals - 01/13/21 1139       PT LONG TERM GOAL #1   Title Patient will be able to sit with good posture for at least 45 seconds to demonstrate improved sitting posture.    Baseline currently 5 seconds  Time 6    Period Weeks    Status New    Target Date 02/24/21      PT LONG TERM GOAL #2   Title Patient will be able to demonstrate good TRA activation in sitting and standign to improve standing tolerance for chores.    Time 6    Period Weeks    Status New    Target Date 02/24/21      PT LONG TERM GOAL #3   Title Patient will report at least 50% improvement in overall symptoms and/or function to demonstrate improved functional mobility    Time 6    Period Weeks    Status New    Target Date 02/24/21                   Plan - 01/15/21 1114     Clinical Impression Statement Patient reports consistent levels of pain. Reviewed HEP and goals. Introduced patient to supine chin tucks and scapular retraction for improved posturing. Patient reported high pain levels with movement and TTP of paraspinals and RT upper trap. Educated patient on benefit on TA activation and diaphragmatic breathing for decreased pain. Patient continues to benefit from skilled therapy to address ROM and strength deficits to reduce pain and improve quality of life.    Personal Factors and Comorbidities Comorbidity 1;Time since onset of injury/illness/exacerbation    Comorbidities abdominal surgery    Examination-Activity Limitations Bend;Carry;Squat;Stairs;Lift;Locomotion Level;Transfers    Examination-Participation Restrictions Cleaning;Community Activity;Meal Prep;Laundry    Stability/Clinical Decision Making Evolving/Moderate complexity    Rehab Potential Fair    PT Frequency 2x / week    PT Duration 6 weeks    PT Treatment/Interventions ADLs/Self Care Home Management;Aquatic  Therapy;Cryotherapy;Electrical Stimulation;DME Instruction;Traction;Moist Heat;Gait training;Stair training;Functional mobility training;Therapeutic activities;Therapeutic exercise;Balance training;Patient/family education;Neuromuscular re-education;Manual techniques;Passive range of motion;Taping;Energy conservation;Dry needling;Spinal Manipulations;Joint Manipulations    PT Next Visit Plan Core and postural strengthening as tolerated. Pain science education, neck ROM, manual/modalities as indicated for pain.    PT Home Exercise Plan posture 10/12 supine chin tuck, scap retraction, ab brace    Consulted and Agree with Plan of Care Patient             Patient will benefit from skilled therapeutic intervention in order to improve the following deficits and impairments:  Abnormal gait, Decreased activity tolerance, Decreased balance, Decreased mobility, Decreased endurance, Decreased strength, Difficulty walking, Impaired perceived functional ability, Postural dysfunction, Improper body mechanics, Pain, Decreased range of motion, Decreased scar mobility  Visit Diagnosis: Cervicalgia  Difficulty in walking, not elsewhere classified  Chronic midline low back pain, unspecified whether sciatica present  Muscle weakness (generalized)     Problem List Patient Active Problem List   Diagnosis Date Noted   Upper abdominal pain 01/07/2021   Loss of weight 01/07/2021   At risk for sleep apnea 12/18/2020   Neck pain 12/18/2020   Asthmatic bronchitis , chronic (HCC) 10/17/2020   Left maxillary sinusitis 10/17/2020   Chronic venous insufficiency 10/08/2020   Major depressive disorder 09/10/2020   PTSD (post-traumatic stress disorder) 09/10/2020   Fibromyalgia    Leg swelling 08/15/2020   Tobacco abuse 08/15/2020   Abdominal pain, epigastric 05/29/2020   Dysphagia 05/29/2020   Iron deficiency anemia 05/13/2020   Asthma due to seasonal allergies 04/17/2020   Constipation 03/12/2020   Deep  vein thrombosis of portal vein 03/06/2020   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   Macrocytic anemia 03/06/2020   Polyarthralgia 03/06/2020   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   Chronic  liver disease 11/16/2017   Alcoholic hepatitis with ascites 10/05/2016   History of marijuana use 01/20/2015   Abnormal CT scan, colon 01/08/2015   Generalized anxiety disorder with panic attacks    Thyroid nodule 01/11/2014   SVT (supraventricular tachycardia) (HCC) 09/07/2012   GERD (gastroesophageal reflux disease) 07/21/2012   Alcohol abuse, in remission    RUQ pain 10/28/2011   11:33 AM, 01/15/21 Georges Lynch PT DPT  Physical Therapist with [place]  [facility]  [phone]  [facility] [address] [place], [place], [address] Phone: [phone]   Fax:  [phone]  Name: Kara Stewart MRN: [id number] Date of Birth: [birth year]

## 2021-01-17 ENCOUNTER — Ambulatory Visit: Payer: Medicaid Other | Admitting: Internal Medicine

## 2021-01-21 ENCOUNTER — Other Ambulatory Visit: Payer: Self-pay

## 2021-01-21 ENCOUNTER — Ambulatory Visit (INDEPENDENT_AMBULATORY_CARE_PROVIDER_SITE_OTHER): Payer: Medicaid Other | Admitting: Psychiatry

## 2021-01-21 DIAGNOSIS — F321 Major depressive disorder, single episode, moderate: Secondary | ICD-10-CM

## 2021-01-21 DIAGNOSIS — F4310 Post-traumatic stress disorder, unspecified: Secondary | ICD-10-CM

## 2021-01-21 NOTE — Progress Notes (Signed)
  In- Person     THERAPIST PROGRESS NOTE  Session Time: Tuesday 01/21/2021 8:35 AM  - 9:15 AM   Participation Level: Active  Behavioral Response: CasualAlertAnxious and Depressed  Type of Therapy: Individual Therapy  Treatment Goals addressed: Establish therapeutic alliance, learn and implement cognitive and behavioral strategies to cope with stress and anxiety so that it does not interfere with daily functioning  Interventions: CBT and Supportive  Summary: Kara Stewart is a 45 y.o. female who is referred for services by PCP Dr. Allena Katz due to pt experiencing symptoms of depression and anxiety. She denies any psychiatric hospitalizations, She reports participating in therapy through [facility] and last was seen 10 years ago.  Patient states her everyday life is up in the air and she just cannot get it together.  She reports multiple physical ailments and was diagnosed with primary biliary cirrhosis.  She reports worrying about how much longer she has to live.  Current symptoms and problems include emotional outburst, becoming overly emotional about small things, becoming irritated easily, difficulty completing task around the house, and feeling overwhelmed.  Patient also presents with a trauma history.  Patient last was seen in the office for the assessment appointment about 2 weeks ago.  She reports symptoms have worsened since last session.  She is emotional and reports feeling very overwhelmed in today's session.  She reports multiple stressors including physical ailments.  However, her most prevalent stressor now appears to be financial issues.  She reports stress related to utility bills and difficulty purchasing food.  She has utilized [employer].  Patient is in the process of applying for disability.  She expresses frustration as she is unable to use coping strategies she used in the past to manage stress.  She reports she was able to do this until November of last year when she fell.   Since that time, she reports memory difficulty and poor concentration have prevented her from using previous strategies.    Suicidal/Homicidal: Nowithout intent/plan  Therapist Response: Reviewed symptoms, discussed stressors, facilitated expression of thoughts and feelings, validated feelings, gather more information from patient regarding her history, assisted patient identify other possible community resources including DSS regarding help with energy bills, provided psychoeducation on anxiety and stress response, discussed rationale for and assisted patient practice deep breathing to trigger relaxation response, develop plan with patient to practice deep breathing 5 to 10 minutes twice per day, provided patient with handout, began to discuss targets for treatment-we will develop treatment plan next session  Plan: Return again in 2 weeks.  Diagnosis: Axis I: PTSD, MDD        Kara Salvage, LCSW 01/21/2021

## 2021-01-22 ENCOUNTER — Ambulatory Visit (HOSPITAL_COMMUNITY)
Admission: RE | Admit: 2021-01-22 | Discharge: 2021-01-22 | Disposition: A | Discharge status: Home / Self Care | Payer: Medicaid Other | Source: Ambulatory Visit | Attending: Gastroenterology | Admitting: Gastroenterology

## 2021-01-22 ENCOUNTER — Other Ambulatory Visit (HOSPITAL_COMMUNITY)
Admission: RE | Admit: 2021-01-22 | Discharge: 2021-01-22 | Disposition: A | Discharge status: Home / Self Care | Payer: Medicaid Other | Source: Ambulatory Visit | Attending: Gastroenterology | Admitting: Gastroenterology

## 2021-01-22 ENCOUNTER — Encounter (HOSPITAL_COMMUNITY): Payer: Self-pay

## 2021-01-22 ENCOUNTER — Ambulatory Visit (HOSPITAL_COMMUNITY): Admission: RE | Admit: 2021-01-22 | Payer: Medicaid Other | Source: Ambulatory Visit

## 2021-01-22 DIAGNOSIS — R1310 Dysphagia, unspecified: Secondary | ICD-10-CM | POA: Insufficient documentation

## 2021-01-22 DIAGNOSIS — K219 Gastro-esophageal reflux disease without esophagitis: Secondary | ICD-10-CM | POA: Insufficient documentation

## 2021-01-22 DIAGNOSIS — R1013 Epigastric pain: Secondary | ICD-10-CM | POA: Insufficient documentation

## 2021-01-22 DIAGNOSIS — R1010 Upper abdominal pain, unspecified: Secondary | ICD-10-CM | POA: Insufficient documentation

## 2021-01-22 DIAGNOSIS — R634 Abnormal weight loss: Secondary | ICD-10-CM | POA: Insufficient documentation

## 2021-01-22 DIAGNOSIS — K743 Primary biliary cirrhosis: Secondary | ICD-10-CM | POA: Insufficient documentation

## 2021-01-22 LAB — CORTISOL-AM, BLOOD: Cortisol - AM: 11.8 ug/dL (ref 6.7–22.6)

## 2021-01-22 LAB — T4, FREE: Free T4: 0.84 ng/dL (ref 0.61–1.12)

## 2021-01-22 LAB — TSH: TSH: 0.882 u[IU]/mL (ref 0.350–4.500)

## 2021-01-28 ENCOUNTER — Other Ambulatory Visit: Payer: Self-pay

## 2021-01-28 DIAGNOSIS — K743 Primary biliary cirrhosis: Secondary | ICD-10-CM

## 2021-01-28 DIAGNOSIS — R109 Unspecified abdominal pain: Secondary | ICD-10-CM

## 2021-01-28 DIAGNOSIS — R634 Abnormal weight loss: Secondary | ICD-10-CM

## 2021-01-29 ENCOUNTER — Other Ambulatory Visit: Payer: Self-pay

## 2021-01-29 ENCOUNTER — Encounter (HOSPITAL_COMMUNITY): Payer: Self-pay | Admitting: Physical Therapy

## 2021-01-29 ENCOUNTER — Ambulatory Visit (HOSPITAL_COMMUNITY): Payer: Medicaid Other | Admitting: Physical Therapy

## 2021-01-29 DIAGNOSIS — M545 Low back pain, unspecified: Secondary | ICD-10-CM | POA: Diagnosis not present

## 2021-01-29 DIAGNOSIS — R262 Difficulty in walking, not elsewhere classified: Secondary | ICD-10-CM

## 2021-01-29 DIAGNOSIS — M6281 Muscle weakness (generalized): Secondary | ICD-10-CM

## 2021-01-29 DIAGNOSIS — G8929 Other chronic pain: Secondary | ICD-10-CM | POA: Diagnosis not present

## 2021-01-29 DIAGNOSIS — M542 Cervicalgia: Secondary | ICD-10-CM

## 2021-01-29 NOTE — Therapy (Signed)
[facility] [address] [place], [place], [address] Phone: [phone]   Fax:  [phone]  Physical Therapy Treatment  Patient Details  Name: Kara Stewart MRN: [id number] Date of Birth: 1975-06-10 Referring Provider (PT): Anabel Halon   Encounter Date: 01/29/2021   PT End of Session - 01/29/21 1003     Visit Number 3    Number of Visits 12    Date for PT Re-Evaluation 02/24/21    Authorization Type [place] medicaid [employer] 20 previously used this year 7 approved from 10/11-11/21    Authorization Time Period pending    Authorization - Visit Number 2    Authorization - Number of Visits 7    Progress Note Due on Visit 10    PT Start Time 1004    PT Stop Time 1045    PT Time Calculation (min) 41 min    Activity Tolerance Patient limited by pain    Behavior During Therapy [facility] for tasks assessed/performed             Past Medical History:  Diagnosis Date   Abdominal pain, epigastric 05/29/2020   Alcohol abuse, in remission    Alcoholic hepatitis with ascites 10/05/2016   Has continued to abstain from ETOH since May 2018, known severe hepatic steatosis, HSM, possible early cirrhosis. Treating with URSO due to elevated AMA. LFTs slowly improving. Concern for recurrent non-tense ascites on exam: patient feels uncomfortable and requesting paracentesis. This is likely   Asthma due to seasonal allergies 04/17/2020   Chronic liver disease 11/16/2017   Deep vein thrombosis of portal vein 03/06/2020   Dysphagia 05/29/2020   Fibromyalgia    Generalized anxiety disorder with panic attacks    GERD (gastroesophageal reflux disease) 07/21/2012   Hiatal hernia    History of alcohol abuse 04/26/2015   History of colitis 11/29/2014   History of marijuana use 01/20/2015   Hypertriglyceridemia    Hypothyroid    Iritis    frequent   Iron deficiency anemia 05/13/2020   Macrocytic anemia 03/06/2020   Major depressive disorder 09/10/2020   Ovarian  cyst    Polyarthralgia 03/06/2020   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   PTSD (post-traumatic stress disorder) 09/10/2020   S/P closed fracture of wrist 03/06/2020   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   SVT (supraventricular tachycardia)    last issue 12/2017   Thyroid nodule 01/11/2014    Past Surgical History:  Procedure Laterality Date   BIOPSY N/A 02/05/2015   Procedure: BIOPSY;  Surgeon: West Bali, MD;  Location: [facility];  Service: Endoscopy;  Laterality: N/A;   COLONOSCOPY     COLONOSCOPY WITH PROPOFOL N/A 02/05/2015   XBD:ZHGDJ HH/mild diverticulosis   ESOPHAGEAL DILATION N/A 11/21/2015   Procedure: ESOPHAGEAL DILATION;  Surgeon: Corbin Ade, MD;  Location: [facility];  Service: Endoscopy;  Laterality: N/A;   ESOPHAGOGASTRODUODENOSCOPY  11/06/2011   [name]: MILD Esophagitis/PATENT ESOPHAGEAL Stricture/  Moderate gastritis. Bx no.hpylori or celiac, +gastritis   ESOPHAGOGASTRODUODENOSCOPY (EGD) WITH PROPOFOL N/A 03/20/2014   [name]: 1. Mild esophagitis & distal esophagela stricture. 2. small hiatal hernia 3. moderate non-erosive gastritis and mild duodenits   ESOPHAGOGASTRODUODENOSCOPY (EGD) WITH PROPOFOL N/A 11/21/2015   Dr. Jena Gauss: LA grade B esophagitis, MW tear likely source of hematemesis   LAPAROSCOPIC TUBAL LIGATION Bilateral 09/14/2018   Procedure: LAPAROSCOPIC TUBAL LIGATION;  Surgeon: Allie Bossier, MD;  Location: [facility];  Service: Gynecology;  Laterality: Bilateral;   LIVER BIOPSY  02/23/2020  Procedure: OPEN LIVER BIOPSY;  Surgeon: Fritzi Mandes, MD;  Location: [facility] OR;  Service: General;;   SAVORY DILATION N/A 03/20/2014   Procedure: SAVORY DILATION;  Surgeon: West Bali, MD;  Location: [facility];  Service: Endoscopy;  Laterality: N/A;  dilated with # 12.8, 14,15,16   SPLENECTOMY, TOTAL N/A 02/23/2020   Procedure: SPLENECTOMY;  Surgeon: Fritzi Mandes, MD;  Location: [facility] OR;  Service: General;  Laterality: N/A;   TOOTH EXTRACTION      There were no vitals  filed for this visit.   Subjective Assessment - 01/29/21 1025     Subjective States that everything is terrible. States that she can't do anything but sit or stand straight up. Reports that she was having a lot of pain and tingling in her neck and into her fingers. States towards the end of the day she had blue and painful middle finger. States she has been having shooting pain from the inner part of her arm to her elbow. States that she wore her compression glove and by the time her MD apt rolled around her symptoms had subsided.    Patient Stated Goals would be able to make it through the day with normal household activities and can't wash dishes all at once.    Currently in Pain? Yes    Pain Score 8     Pain Location Neck    Pain Orientation Posterior    Pain Descriptors / Indicators Aching;Throbbing    Pain Type Chronic pain                [facility] PT Assessment - 01/29/21 0001       Assessment   Medical Diagnosis neck pain and back pain    Referring Provider (PT) Dondra Prader Concha Se                           [facility] Adult PT Treatment/Exercise - 01/29/21 0001       Lumbar Exercises: Supine   Other Supine Lumbar Exercises lawn chair position - box breathing with heat - 4 minutes    Other Supine Lumbar Exercises scapular retraction 25-50% available ROM - 2 minutes with 2 breathes in holding position, chin tucks 25-50% available ROM - 2 minutes with 2 breathes in holding position,      Modalities   Modalities Moist Heat      Moist Heat Therapy   Number Minutes Moist Heat 15 Minutes    Moist Heat Location Cervical                     PT Education - 01/29/21 1025     Education Details on current presentation, pain cycle, central nervous system and differeence between fight or flight and rest and digest on percentages of movement.    Person(s) Educated Patient    Methods Explanation    Comprehension Verbalized understanding              PT  Short Term Goals - 01/13/21 1126       PT SHORT TERM GOAL #1   Title Patient will report at least 25% improvement in symptoms for improved quality of life.    Time 3    Period Weeks    Status New    Target Date 02/03/21      PT SHORT TERM GOAL #2   Title Patient will be independent in self management strategies to improve quality of life and  functional outcomes.    Time 3    Period Weeks    Status New    Target Date 02/03/21      PT SHORT TERM GOAL #3   Title Patient will be able to rotate head at least 45 degrees bilaterally    Time 3    Period Weeks    Status New    Target Date 02/03/21               PT Long Term Goals - 01/13/21 1139       PT LONG TERM GOAL #1   Title Patient will be able to sit with good posture for at least 45 seconds to demonstrate improved sitting posture.    Baseline currently 5 seconds    Time 6    Period Weeks    Status New    Target Date 02/24/21      PT LONG TERM GOAL #2   Title Patient will be able to demonstrate good TRA activation in sitting and standign to improve standing tolerance for chores.    Time 6    Period Weeks    Status New    Target Date 02/24/21      PT LONG TERM GOAL #3   Title Patient will report at least 50% improvement in overall symptoms and/or function to demonstrate improved functional mobility    Time 6    Period Weeks    Status New    Target Date 02/24/21                   Plan - 01/29/21 1046     Clinical Impression Statement Session focused on education of pain science, central nervous system, and how this relates to pain and symptoms. Discussed overall available motion and overall painful motion and instructed patient to perform motions within a smaller percentage of available ROM. Reassured patient with all movements not being harmful and answered all questions. Will continue with gentle isometrics, modalities as needed and guided imagery/breathing as indicated.    Personal Factors and  Comorbidities Comorbidity 1;Time since onset of injury/illness/exacerbation    Comorbidities abdominal surgery    Examination-Activity Limitations Bend;Carry;Squat;Stairs;Lift;Locomotion Level;Transfers    Examination-Participation Restrictions Cleaning;Community Activity;Meal Prep;Laundry    Stability/Clinical Decision Making Evolving/Moderate complexity    Rehab Potential Fair    PT Frequency 2x / week    PT Duration 6 weeks    PT Treatment/Interventions ADLs/Self Care Home Management;Aquatic Therapy;Cryotherapy;Electrical Stimulation;DME Instruction;Traction;Moist Heat;Gait training;Stair training;Functional mobility training;Therapeutic activities;Therapeutic exercise;Balance training;Patient/family education;Neuromuscular re-education;Manual techniques;Passive range of motion;Taping;Energy conservation;Dry needling;Spinal Manipulations;Joint Manipulations    PT Next Visit Plan gentle isometrics and ROM - lawnchair position with heat/modaliteies as indicated, breathwork, guided imagery    PT Home Exercise Plan posture 10/12 supine chin tuck, scap retraction, ab brace    Consulted and Agree with Plan of Care Patient             Patient will benefit from skilled therapeutic intervention in order to improve the following deficits and impairments:  Abnormal gait, Decreased activity tolerance, Decreased balance, Decreased mobility, Decreased endurance, Decreased strength, Difficulty walking, Impaired perceived functional ability, Postural dysfunction, Improper body mechanics, Pain, Decreased range of motion, Decreased scar mobility  Visit Diagnosis: Cervicalgia  Difficulty in walking, not elsewhere classified  Chronic midline low back pain, unspecified whether sciatica present  Muscle weakness (generalized)     Problem List Patient Active Problem List   Diagnosis Date Noted   Upper abdominal pain 01/07/2021   Loss  of weight 01/07/2021   At risk for sleep apnea 12/18/2020   Neck  pain 12/18/2020   Asthmatic bronchitis , chronic (HCC) 10/17/2020   Left maxillary sinusitis 10/17/2020   Chronic venous insufficiency 10/08/2020   Major depressive disorder 09/10/2020   PTSD (post-traumatic stress disorder) 09/10/2020   Fibromyalgia    Leg swelling 08/15/2020   Tobacco abuse 08/15/2020   Abdominal pain, epigastric 05/29/2020   Dysphagia 05/29/2020   Iron deficiency anemia 05/13/2020   Asthma due to seasonal allergies 04/17/2020   Constipation 03/12/2020   Deep vein thrombosis of portal vein 03/06/2020   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   Macrocytic anemia 03/06/2020   Polyarthralgia 03/06/2020   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   Chronic liver disease 11/16/2017   Alcoholic hepatitis with ascites 10/05/2016   History of marijuana use 01/20/2015   Abnormal CT scan, colon 01/08/2015   Generalized anxiety disorder with panic attacks    Thyroid nodule 01/11/2014   SVT (supraventricular tachycardia) (HCC) 09/07/2012   GERD (gastroesophageal reflux disease) 07/21/2012   Alcohol abuse, in remission    RUQ pain 10/28/2011   10:47 AM, 01/29/21 Tereasa Coop, DPT Physical Therapy with [facility]  [phone] office   [facility] [address] [place], [place], [address] Phone: [phone]   Fax:  [phone]  Name: Kara Stewart MRN: [id number] Date of Birth: 1976-01-17

## 2021-01-30 ENCOUNTER — Encounter (HOSPITAL_COMMUNITY): Payer: Medicaid Other | Admitting: Physical Therapy

## 2021-02-04 ENCOUNTER — Other Ambulatory Visit: Payer: Self-pay

## 2021-02-04 ENCOUNTER — Ambulatory Visit (HOSPITAL_COMMUNITY): Payer: Medicaid Other | Attending: Internal Medicine | Admitting: Physical Therapy

## 2021-02-04 ENCOUNTER — Encounter (HOSPITAL_COMMUNITY): Payer: Self-pay | Admitting: Physical Therapy

## 2021-02-04 DIAGNOSIS — G8929 Other chronic pain: Secondary | ICD-10-CM | POA: Diagnosis not present

## 2021-02-04 DIAGNOSIS — M6281 Muscle weakness (generalized): Secondary | ICD-10-CM

## 2021-02-04 DIAGNOSIS — M542 Cervicalgia: Secondary | ICD-10-CM | POA: Diagnosis not present

## 2021-02-04 DIAGNOSIS — M545 Low back pain, unspecified: Secondary | ICD-10-CM | POA: Insufficient documentation

## 2021-02-04 DIAGNOSIS — R262 Difficulty in walking, not elsewhere classified: Secondary | ICD-10-CM | POA: Diagnosis not present

## 2021-02-04 NOTE — Therapy (Signed)
[place], [place], [address] Phone: [phone]   Fax:  [phone]  Physical Therapy Treatment  Patient Details  Name: Kara Stewart MRN: [id number] Date of Birth: 04-30-1975 Referring Provider (PT): Lindell Spar   Encounter Date: 02/04/2021   PT End of Session - 02/04/21 1015     Visit Number 4    Number of Visits 12    Date for PT Re-Evaluation 02/24/21    Authorization Type [place] medicaid healthyblue 20 previously used this year 7 approved from 10/11-11/21    Authorization Time Period pending    Authorization - Visit Number 3    Authorization - Number of Visits 7    Progress Note Due on Visit 10    PT Start Time 1003    PT Stop Time 1045    PT Time Calculation (min) 42 min    Activity Tolerance Patient limited by pain    Behavior During Therapy [facility] for tasks assessed/performed             Past Medical History:  Diagnosis Date   Abdominal pain, epigastric 05/29/2020   Alcohol abuse, in remission    Alcoholic hepatitis with ascites 10/05/2016   Has continued to abstain from ETOH since May 2018, known severe hepatic steatosis, HSM, possible early cirrhosis. Treating with URSO due to elevated AMA. LFTs slowly improving. Concern for recurrent non-tense ascites on exam: patient feels uncomfortable and requesting paracentesis. This is likely   Asthma due to seasonal allergies 04/17/2020   Chronic liver disease 11/16/2017   Deep vein thrombosis of portal vein 03/06/2020   Dysphagia 05/29/2020   Fibromyalgia    Generalized anxiety disorder with panic attacks    GERD (gastroesophageal reflux disease) 07/21/2012   Hiatal hernia    History of alcohol abuse 04/26/2015   History of colitis 11/29/2014   History of marijuana use 01/20/2015   Hypertriglyceridemia    Hypothyroid    Iritis    frequent   Iron deficiency anemia 05/13/2020   Macrocytic anemia 03/06/2020   Major depressive disorder 09/10/2020   Ovarian  cyst    Polyarthralgia 03/06/2020   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   PTSD (post-traumatic stress disorder) 09/10/2020   S/P closed fracture of wrist 03/06/2020   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   SVT (supraventricular tachycardia)    last issue 12/2017   Thyroid nodule 01/11/2014    Past Surgical History:  Procedure Laterality Date   BIOPSY N/A 02/05/2015   Procedure: BIOPSY;  Surgeon: Danie Binder, MD;  [facility];  Service: Endoscopy;  Laterality: N/A;   COLONOSCOPY     COLONOSCOPY WITH PROPOFOL N/A 02/05/2015   XFG:HWEXH HH/mild diverticulosis   ESOPHAGEAL DILATION N/A 11/21/2015   Procedure: ESOPHAGEAL DILATION;  Surgeon: Daneil Dolin, MD;  [facility];  Service: Endoscopy;  Laterality: N/A;   ESOPHAGOGASTRODUODENOSCOPY  11/06/2011   [name]: MILD Esophagitis/PATENT ESOPHAGEAL Stricture/  Moderate gastritis. Bx no.hpylori or celiac, +gastritis   ESOPHAGOGASTRODUODENOSCOPY (EGD) WITH PROPOFOL N/A 03/20/2014   [name]: 1. Mild esophagitis & distal esophagela stricture. 2. small hiatal hernia 3. moderate non-erosive gastritis and mild duodenits   ESOPHAGOGASTRODUODENOSCOPY (EGD) WITH PROPOFOL N/A 11/21/2015   Dr. Gala Romney: LA grade B esophagitis, MW tear likely source of hematemesis   LAPAROSCOPIC TUBAL LIGATION Bilateral 09/14/2018   Procedure: LAPAROSCOPIC TUBAL LIGATION;  Surgeon: Emily Filbert, MD;  Location: [place];  Service: Gynecology;  Laterality: Bilateral;   LIVER BIOPSY  02/23/2020  Procedure: OPEN LIVER BIOPSY;  Surgeon: Dwan Bolt, MD;  Location: [place];  Service: General;;   SAVORY DILATION N/A 03/20/2014   Procedure: SAVORY DILATION;  Surgeon: Danie Binder, MD;  [facility];  Service: Endoscopy;  Laterality: N/A;  dilated with # 12.8, 14,15,16   SPLENECTOMY, TOTAL N/A 02/23/2020   Procedure: SPLENECTOMY;  Surgeon: Dwan Bolt, MD;  Location: [place];  Service: General;  Laterality: N/A;   TOOTH EXTRACTION      There were no vitals  filed for this visit.   Subjective Assessment - 02/04/21 1002     Subjective Pt states it seems like the more she does the exercises the more pain that she has.  Pt states she feels terrible with her neck hurting her more than her back.    Patient Stated Goals would be able to make it through the day with normal household activities and can't wash dishes all at once.    Currently in Pain? Yes    Pain Score 8     Pain Location Neck    Pain Orientation Right    Pain Descriptors / Indicators Aching;Throbbing    Pain Type Chronic pain    Pain Onset More than a month ago    Pain Frequency Intermittent    Aggravating Factors  any motion    Pain Relieving Factors meds ,heat    Effect of Pain on Daily Activities limits                               [facility] Adult PT Treatment/Exercise - 02/04/21 0001       Exercises   Exercises Neck;Lumbar      Neck Exercises: Seated   Other Seated Exercise tall sitting x 5    Other Seated Exercise cervical,  excursions x 2      Neck Exercises: Supine   Neck Retraction 10 reps;3 secs    Cervical Rotation Both;5 reps    Shoulder Flexion Both;5 reps    Other Supine Exercise scapular retractionx 10      Lumbar Exercises: Stretches   Lower Trunk Rotation 5 reps;2 reps      Lumbar Exercises: Standing   Other Standing Lumbar Exercises hip excursion  x 2      Lumbar Exercises: Supine   Ab Set 10 reps    Bent Knee Raise 5 reps      Modalities   Modalities Moist Heat      Moist Heat Therapy   Number Minutes Moist Heat 15 Minutes    Moist Heat Location Cervical                     PT Education - 02/04/21 1025     Education Details proper body mechanics for bed mobility    Person(s) Educated Patient    Methods Explanation    Comprehension Verbalized understanding              PT Short Term Goals - 02/04/21 1020       PT SHORT TERM GOAL #1   Title Patient will report at least 25% improvement in symptoms  for improved quality of life.    Time 3    Period Weeks    Status On-going    Target Date 02/03/21      PT SHORT TERM GOAL #2   Title Patient will be independent in self management strategies to improve quality  of life and functional outcomes.    Time 3    Period Weeks    Status Partially Met    Target Date 02/03/21      PT SHORT TERM GOAL #3   Title Patient will be able to rotate head at least 45 degrees bilaterally    Time 3    Period Weeks    Status Achieved   able to completed when she is not thinking about it.   Target Date 02/03/21               PT Long Term Goals - 02/04/21 1021       PT LONG TERM GOAL #1   Title Patient will be able to sit with good posture for at least 45 seconds to demonstrate improved sitting posture.    Baseline currently 5 seconds    Time 6    Period Weeks    Status On-going      PT LONG TERM GOAL #2   Title Patient will be able to demonstrate good TRA activation in sitting and standign to improve standing tolerance for chores.    Time 6    Period Weeks    Status On-going      PT LONG TERM GOAL #3   Title Patient will report at least 50% improvement in overall symptoms and/or function to demonstrate improved functional mobility    Time 6    Period Weeks    Status On-going                   Plan - 02/04/21 1016     Clinical Impression Statement Pt educated in proper body mechanics for bed mobility as she was using improper mechanics, explained that we need to start stacking straws off the camels back for overall pain reduction, ie better mechanics, stretching, moving more.    Personal Factors and Comorbidities Comorbidity 1;Time since onset of injury/illness/exacerbation    Comorbidities abdominal surgery    Examination-Activity Limitations Bend;Carry;Squat;Stairs;Lift;Locomotion Level;Transfers    Examination-Participation Restrictions Cleaning;Community Activity;Meal Prep;Laundry    Stability/Clinical Decision Making  Evolving/Moderate complexity    Rehab Potential Fair    PT Frequency 2x / week    PT Duration 6 weeks    PT Treatment/Interventions ADLs/Self Care Home Management;Aquatic Therapy;Cryotherapy;Electrical Stimulation;DME Instruction;Traction;Moist Heat;Gait training;Stair training;Functional mobility training;Therapeutic activities;Therapeutic exercise;Balance training;Patient/family education;Neuromuscular re-education;Manual techniques;Passive range of motion;Taping;Energy conservation;Dry needling;Spinal Manipulations;Joint Manipulations    PT Next Visit Plan gentle isometrics and ROM - lawnchair position with heat/modaliteies as indicated, breathwork, guided imagery    PT Home Exercise Plan posture 10/12 supine chin tuck, scap retraction, ab brace    Consulted and Agree with Plan of Care Patient             Patient will benefit from skilled therapeutic intervention in order to improve the following deficits and impairments:  Abnormal gait, Decreased activity tolerance, Decreased balance, Decreased mobility, Decreased endurance, Decreased strength, Difficulty walking, Impaired perceived functional ability, Postural dysfunction, Improper body mechanics, Pain, Decreased range of motion, Decreased scar mobility  Visit Diagnosis: Cervicalgia  Difficulty in walking, not elsewhere classified  Chronic midline low back pain, unspecified whether sciatica present  Muscle weakness (generalized)     Problem List Patient Active Problem List   Diagnosis Date Noted   Upper abdominal pain 01/07/2021   Loss of weight 01/07/2021   At risk for sleep apnea 12/18/2020   Neck pain 12/18/2020   Asthmatic bronchitis , chronic ([place]) 10/17/2020   Left maxillary sinusitis 10/17/2020  Chronic venous insufficiency 10/08/2020   Major depressive disorder 09/10/2020   PTSD (post-traumatic stress disorder) 09/10/2020   Fibromyalgia    Leg swelling 08/15/2020   Tobacco abuse 08/15/2020   Abdominal pain,  epigastric 05/29/2020   Dysphagia 05/29/2020   Iron deficiency anemia 05/13/2020   Asthma due to seasonal allergies 04/17/2020   Constipation 03/12/2020   Deep vein thrombosis of portal vein 03/06/2020   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   Macrocytic anemia 03/06/2020   Polyarthralgia 03/06/2020   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   Chronic liver disease 78/97/8478   Alcoholic hepatitis with ascites 10/05/2016   History of marijuana use 01/20/2015   Abnormal CT scan, colon 01/08/2015   Generalized anxiety disorder with panic attacks    Thyroid nodule 01/11/2014   SVT (supraventricular tachycardia) ([place]) 09/07/2012   GERD (gastroesophageal reflux disease) 07/21/2012   Alcohol abuse, in remission    RUQ pain 10/28/2011   Rayetta Humphrey, PT CLT [phone]  02/04/2021, 10:43 AM  [place], [place], [address] Phone: [phone]   Fax:  [phone]  Name: Kara Stewart MRN: [id number] Date of Birth: 03/16/1976

## 2021-02-05 ENCOUNTER — Encounter (HOSPITAL_COMMUNITY): Payer: Self-pay | Admitting: Physical Therapy

## 2021-02-05 ENCOUNTER — Ambulatory Visit (HOSPITAL_COMMUNITY): Payer: Medicaid Other | Admitting: Physical Therapy

## 2021-02-05 DIAGNOSIS — M542 Cervicalgia: Secondary | ICD-10-CM | POA: Diagnosis not present

## 2021-02-05 DIAGNOSIS — G8929 Other chronic pain: Secondary | ICD-10-CM

## 2021-02-05 DIAGNOSIS — M6281 Muscle weakness (generalized): Secondary | ICD-10-CM | POA: Diagnosis not present

## 2021-02-05 DIAGNOSIS — M545 Low back pain, unspecified: Secondary | ICD-10-CM

## 2021-02-05 DIAGNOSIS — R262 Difficulty in walking, not elsewhere classified: Secondary | ICD-10-CM

## 2021-02-05 NOTE — Patient Instructions (Signed)
Access Code: [email or web address] URL: [email or web address] Date: 02/05/2021 Prepared by: Georges Lynch  Exercises Hooklying Gluteal Sets - 2 x daily - 7 x weekly - 1-2 sets - 10 reps - 3-5 sec hold Supine Posterior Pelvic Tilt - 2 x daily - 7 x weekly - 1-2 sets - 10 reps - 3-5 sec hold

## 2021-02-05 NOTE — Therapy (Signed)
[place], [place], [address] Phone: [phone]   Fax:  [phone]  Physical Therapy Treatment  Patient Details  Name: Kara Stewart MRN: [id number] Date of Birth: Oct 30, 1975 Referring Provider (PT): Lindell Spar   Encounter Date: 02/05/2021   PT End of Session - 02/05/21 0914     Visit Number 5    Number of Visits 12    Date for PT Re-Evaluation 02/24/21    Authorization Type [place] medicaid healthyblue 20 previously used this year 7 approved from 10/11-11/21    Authorization - Visit Number 4    Authorization - Number of Visits 7    Progress Note Due on Visit 10    PT Start Time 0906    PT Stop Time 0945    PT Time Calculation (min) 39 min    Activity Tolerance Patient limited by pain    Behavior During Therapy [facility] for tasks assessed/performed             Past Medical History:  Diagnosis Date   Abdominal pain, epigastric 05/29/2020   Alcohol abuse, in remission    Alcoholic hepatitis with ascites 10/05/2016   Has continued to abstain from ETOH since May 2018, known severe hepatic steatosis, HSM, possible early cirrhosis. Treating with URSO due to elevated AMA. LFTs slowly improving. Concern for recurrent non-tense ascites on exam: patient feels uncomfortable and requesting paracentesis. This is likely   Asthma due to seasonal allergies 04/17/2020   Chronic liver disease 11/16/2017   Deep vein thrombosis of portal vein 03/06/2020   Dysphagia 05/29/2020   Fibromyalgia    Generalized anxiety disorder with panic attacks    GERD (gastroesophageal reflux disease) 07/21/2012   Hiatal hernia    History of alcohol abuse 04/26/2015   History of colitis 11/29/2014   History of marijuana use 01/20/2015   Hypertriglyceridemia    Hypothyroid    Iritis    frequent   Iron deficiency anemia 05/13/2020   Macrocytic anemia 03/06/2020   Major depressive disorder 09/10/2020   Ovarian cyst    Polyarthralgia 03/06/2020    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   PTSD (post-traumatic stress disorder) 09/10/2020   S/P closed fracture of wrist 03/06/2020   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   SVT (supraventricular tachycardia)    last issue 12/2017   Thyroid nodule 01/11/2014    Past Surgical History:  Procedure Laterality Date   BIOPSY N/A 02/05/2015   Procedure: BIOPSY;  Surgeon: Danie Binder, MD;  [facility];  Service: Endoscopy;  Laterality: N/A;   COLONOSCOPY     COLONOSCOPY WITH PROPOFOL N/A 02/05/2015   ZWC:HENID HH/mild diverticulosis   ESOPHAGEAL DILATION N/A 11/21/2015   Procedure: ESOPHAGEAL DILATION;  Surgeon: Daneil Dolin, MD;  [facility];  Service: Endoscopy;  Laterality: N/A;   ESOPHAGOGASTRODUODENOSCOPY  11/06/2011   [name]: MILD Esophagitis/PATENT ESOPHAGEAL Stricture/  Moderate gastritis. Bx no.hpylori or celiac, +gastritis   ESOPHAGOGASTRODUODENOSCOPY (EGD) WITH PROPOFOL N/A 03/20/2014   [name]: 1. Mild esophagitis & distal esophagela stricture. 2. small hiatal hernia 3. moderate non-erosive gastritis and mild duodenits   ESOPHAGOGASTRODUODENOSCOPY (EGD) WITH PROPOFOL N/A 11/21/2015   Dr. Gala Romney: LA grade B esophagitis, MW tear likely source of hematemesis   LAPAROSCOPIC TUBAL LIGATION Bilateral 09/14/2018   Procedure: LAPAROSCOPIC TUBAL LIGATION;  Surgeon: Emily Filbert, MD;  Location: [place];  Service: Gynecology;  Laterality: Bilateral;   LIVER BIOPSY  02/23/2020   Procedure: OPEN LIVER BIOPSY;  Surgeon:  Dwan Bolt, MD;  Location: [place];  Service: General;;   SAVORY DILATION N/A 03/20/2014   Procedure: Azzie [place]mas DILATION;  Surgeon: Danie Binder, MD;  [facility];  Service: Endoscopy;  Laterality: N/A;  dilated with # 12.8, 14,15,16   SPLENECTOMY, TOTAL N/A 02/23/2020   Procedure: SPLENECTOMY;  Surgeon: Dwan Bolt, MD;  Location: [place];  Service: General;  Laterality: N/A;   TOOTH EXTRACTION      There were no vitals filed for this visit.   Subjective  Assessment - 02/05/21 0911     Subjective Patient says "It's going terrible". She is in excruciating pain and having trouble putting weight down through her legs.  She is using cane to help with walking.    Patient Stated Goals would be able to make it through the day with normal household activities and can't wash dishes all at once.    Currently in Pain? Yes    Pain Score 9     Pain Location Neck    Pain Orientation Right    Pain Descriptors / Indicators Aching;Stabbing;Sharp    Pain Type Chronic pain    Pain Onset More than a month ago    Pain Frequency Constant                               [facility] Adult PT Treatment/Exercise - 02/05/21 0001       Neck Exercises: Seated   Other Seated Exercise Doesnt tolerate any cervical ROM or chin tucks, increased pain in RT upper trap/ scapula      Neck Exercises: Supine   Other Supine Exercise scapular retraction x 10 (pain free range)      Lumbar Exercises: Stretches   Lower Trunk Rotation 5 reps;10 seconds      Lumbar Exercises: Supine   Ab Set 15 reps;5 seconds    Pelvic Tilt 15 reps    Glut Set 10 reps;3 seconds    Bent Knee Raise 20 reps    Other Supine Lumbar Exercises scap retraction x10    Other Supine Lumbar Exercises diaphragm breathing x10                       PT Short Term Goals - 02/04/21 1020       PT SHORT TERM GOAL #1   Title Patient will report at least 25% improvement in symptoms for improved quality of life.    Time 3    Period Weeks    Status On-going    Target Date 02/03/21      PT SHORT TERM GOAL #2   Title Patient will be independent in self management strategies to improve quality of life and functional outcomes.    Time 3    Period Weeks    Status Partially Met    Target Date 02/03/21      PT SHORT TERM GOAL #3   Title Patient will be able to rotate head at least 45 degrees bilaterally    Time 3    Period Weeks    Status Achieved   able to completed when she is  not thinking about it.   Target Date 02/03/21               PT Long Term Goals - 02/04/21 1021       PT LONG TERM GOAL #1   Title Patient will be able to sit with  good posture for at least 45 seconds to demonstrate improved sitting posture.    Baseline currently 5 seconds    Time 6    Period Weeks    Status On-going      PT LONG TERM GOAL #2   Title Patient will be able to demonstrate good TRA activation in sitting and standign to improve standing tolerance for chores.    Time 6    Period Weeks    Status On-going      PT LONG TERM GOAL #3   Title Patient will report at least 50% improvement in overall symptoms and/or function to demonstrate improved functional mobility    Time 6    Period Weeks    Status On-going                   Plan - 02/05/21 1011     Clinical Impression Statement Patient continues to presents with significant pain and guarding in upper body, limiting participation with certain exercise. She has MD consult tomorrow for her neck. Patient did show improved tolerance with core strengthening exercises and was able to progress exercises to include glute set and pelvic tilts. Patient required verbal cues for proper form and for appropriate TA activation. Also cued and guided through exercise to encourage pain free ranges. Issued updated HEP handout. Patient will continue to benefit from core strength progressions as tolerated, to reduce pain and improve functional ability.    Personal Factors and Comorbidities Comorbidity 1;Time since onset of injury/illness/exacerbation    Comorbidities abdominal surgery    Examination-Activity Limitations Bend;Carry;Squat;Stairs;Lift;Locomotion Level;Transfers    Examination-Participation Restrictions Cleaning;Community Activity;Meal Prep;Laundry    Stability/Clinical Decision Making Evolving/Moderate complexity    Rehab Potential Fair    PT Frequency 2x / week    PT Duration 6 weeks    PT Treatment/Interventions  ADLs/Self Care Home Management;Aquatic Therapy;Cryotherapy;Electrical Stimulation;DME Instruction;Traction;Moist Heat;Gait training;Stair training;Functional mobility training;Therapeutic activities;Therapeutic exercise;Balance training;Patient/family education;Neuromuscular re-education;Manual techniques;Passive range of motion;Taping;Energy conservation;Dry needling;Spinal Manipulations;Joint Manipulations    PT Next Visit Plan gentle isometrics and ROM - lawnchair position with heat/modaliteies as indicated, breathwork, guided imagery    PT Home Exercise Plan posture 10/12 supine chin tuck, scap retraction, ab brace 11/2 pelvic tilt, glute set    Consulted and Agree with Plan of Care Patient             Patient will benefit from skilled therapeutic intervention in order to improve the following deficits and impairments:  Abnormal gait, Decreased activity tolerance, Decreased balance, Decreased mobility, Decreased endurance, Decreased strength, Difficulty walking, Impaired perceived functional ability, Postural dysfunction, Improper body mechanics, Pain, Decreased range of motion, Decreased scar mobility  Visit Diagnosis: Cervicalgia  Difficulty in walking, not elsewhere classified  Chronic midline low back pain, unspecified whether sciatica present  Muscle weakness (generalized)     Problem List Patient Active Problem List   Diagnosis Date Noted   Upper abdominal pain 01/07/2021   Loss of weight 01/07/2021   At risk for sleep apnea 12/18/2020   Neck pain 12/18/2020   Asthmatic bronchitis , chronic ([facility]) 10/17/2020   Left maxillary sinusitis 10/17/2020   Chronic venous insufficiency 10/08/2020   Major depressive disorder 09/10/2020   PTSD (post-traumatic stress disorder) 09/10/2020   Fibromyalgia    Leg swelling 08/15/2020   Tobacco abuse 08/15/2020   Abdominal pain, epigastric 05/29/2020   Dysphagia 05/29/2020   Iron deficiency anemia 05/13/2020   Asthma due to seasonal  allergies 04/17/2020   Constipation 03/12/2020   Deep vein  thrombosis of portal vein 03/06/2020   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   Macrocytic anemia 03/06/2020   Polyarthralgia 03/06/2020   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   Chronic liver disease 56/43/3295   Alcoholic hepatitis with ascites 10/05/2016   History of marijuana use 01/20/2015   Abnormal CT scan, colon 01/08/2015   Generalized anxiety disorder with panic attacks    Thyroid nodule 01/11/2014   SVT (supraventricular tachycardia) ([place]) 09/07/2012   GERD (gastroesophageal reflux disease) 07/21/2012   Alcohol abuse, in remission    RUQ pain 10/28/2011   10:15 AM, 02/05/21 Josue Hector PT DPT  Physical Therapist with [facility]  [phone] [place], [place], [address] Phone: [phone]   Fax:  [phone]  Name: Kara Stewart MRN: [id number] Date of Birth: [birth year]

## 2021-02-06 ENCOUNTER — Encounter (HOSPITAL_COMMUNITY): Payer: Medicaid Other | Admitting: Physical Therapy

## 2021-02-06 DIAGNOSIS — M5020 Other cervical disc displacement, unspecified cervical region: Secondary | ICD-10-CM | POA: Diagnosis not present

## 2021-02-07 ENCOUNTER — Ambulatory Visit (HOSPITAL_COMMUNITY): Payer: Medicaid Other | Admitting: Psychiatry

## 2021-02-11 ENCOUNTER — Encounter (HOSPITAL_COMMUNITY): Payer: Self-pay | Admitting: Physical Therapy

## 2021-02-11 ENCOUNTER — Other Ambulatory Visit: Payer: Self-pay

## 2021-02-11 ENCOUNTER — Ambulatory Visit (HOSPITAL_COMMUNITY): Payer: Medicaid Other | Admitting: Physical Therapy

## 2021-02-11 DIAGNOSIS — G8929 Other chronic pain: Secondary | ICD-10-CM | POA: Diagnosis not present

## 2021-02-11 DIAGNOSIS — M6281 Muscle weakness (generalized): Secondary | ICD-10-CM

## 2021-02-11 DIAGNOSIS — M542 Cervicalgia: Secondary | ICD-10-CM | POA: Diagnosis not present

## 2021-02-11 DIAGNOSIS — M545 Low back pain, unspecified: Secondary | ICD-10-CM

## 2021-02-11 DIAGNOSIS — R262 Difficulty in walking, not elsewhere classified: Secondary | ICD-10-CM | POA: Diagnosis not present

## 2021-02-11 NOTE — Therapy (Signed)
[facility] [address] [place], [place], [address] Phone: [phone]   Fax:  [phone]  Physical Therapy Treatment and Discharge Note  Patient Details  Name: Kara Stewart MRN: [id number] Date of Birth: 01/14/1976 Referring Provider (PT): [facility] THERAPY DISCHARGE SUMMARY  Visits from Start of Care: 6  Current functional level related to goals / functional outcomes: See below  Remaining deficits: See below   Education / Equipment: See below  Patient agrees to discharge. Patient goals were partially met. Patient is being discharged due to lack of progress.   Encounter Date: 02/11/2021   PT End of Session - 02/11/21 1048     Visit Number 6    Number of Visits 12    Date for PT Re-Evaluation 02/24/21    Authorization Type [place] medicaid [employer]blue 20 previously used this year 7 approved from 10/11-11/21    Authorization - Visit Number 5    Authorization - Number of Visits 7    Progress Note Due on Visit 10    PT Start Time 1050    PT Stop Time 1120    PT Time Calculation (min) 30 min    Activity Tolerance Patient limited by pain    Behavior During Therapy [facility] for tasks assessed/performed             Past Medical History:  Diagnosis Date   Abdominal pain, epigastric 05/29/2020   Alcohol abuse, in remission    Alcoholic hepatitis with ascites 10/05/2016   Has continued to abstain from ETOH since May 2018, known severe hepatic steatosis, HSM, possible early cirrhosis. Treating with URSO due to elevated AMA. LFTs slowly improving. Concern for recurrent non-tense ascites on exam: patient feels uncomfortable and requesting paracentesis. This is likely   Asthma due to seasonal allergies 04/17/2020   Chronic liver disease 11/16/2017   Deep vein thrombosis of portal vein 03/06/2020   Dysphagia 05/29/2020   Fibromyalgia    Generalized anxiety disorder with panic attacks    GERD (gastroesophageal reflux  disease) 07/21/2012   Hiatal hernia    History of alcohol abuse 04/26/2015   History of colitis 11/29/2014   History of marijuana use 01/20/2015   Hypertriglyceridemia    Hypothyroid    Iritis    frequent   Iron deficiency anemia 05/13/2020   Macrocytic anemia 03/06/2020   Major depressive disorder 09/10/2020   Ovarian cyst    Polyarthralgia 03/06/2020   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   PTSD (post-traumatic stress disorder) 09/10/2020   S/P closed fracture of wrist 03/06/2020   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   SVT (supraventricular tachycardia)    last issue 12/2017   Thyroid nodule 01/11/2014    Past Surgical History:  Procedure Laterality Date   BIOPSY N/A 02/05/2015   Procedure: BIOPSY;  Surgeon: Danie Binder, MD;  Location: [facility];  Service: Endoscopy;  Laterality: N/A;   COLONOSCOPY     COLONOSCOPY WITH PROPOFOL N/A 02/05/2015   DUK:GURKY HH/mild diverticulosis   ESOPHAGEAL DILATION N/A 11/21/2015   Procedure: ESOPHAGEAL DILATION;  Surgeon: Daneil Dolin, MD;  Location: [facility];  Service: Endoscopy;  Laterality: N/A;   ESOPHAGOGASTRODUODENOSCOPY  11/06/2011   [name]: MILD Esophagitis/PATENT ESOPHAGEAL Stricture/  Moderate gastritis. Bx no.hpylori or celiac, +gastritis   ESOPHAGOGASTRODUODENOSCOPY (EGD) WITH PROPOFOL N/A 03/20/2014   [name]: 1. Mild esophagitis & distal esophagela stricture. 2. small hiatal hernia 3. moderate non-erosive gastritis and mild duodenits   ESOPHAGOGASTRODUODENOSCOPY (EGD) WITH PROPOFOL N/A 11/21/2015  Dr. Gala Romney: LA grade B esophagitis, MW tear likely source of hematemesis   LAPAROSCOPIC TUBAL LIGATION Bilateral 09/14/2018   Procedure: LAPAROSCOPIC TUBAL LIGATION;  Surgeon: Emily Filbert, MD;  Location: [facility];  Service: Gynecology;  Laterality: Bilateral;   LIVER BIOPSY  02/23/2020   Procedure: OPEN LIVER BIOPSY;  Surgeon: Dwan Bolt, MD;  Location: [facility];  Service: General;;   SAVORY DILATION N/A 03/20/2014   Procedure: SAVORY  DILATION;  Surgeon: Danie Binder, MD;  Location: [facility];  Service: Endoscopy;  Laterality: N/A;  dilated with # 12.8, 14,15,16   SPLENECTOMY, TOTAL N/A 02/23/2020   Procedure: SPLENECTOMY;  Surgeon: Dwan Bolt, MD;  Location: [facility];  Service: General;  Laterality: N/A;   TOOTH EXTRACTION      There were no vitals filed for this visit.   Subjective Assessment - 02/11/21 1052     Subjective Reports no improvement so far and that they are going to try an epidural on the 03/03/21. States that she is doing the core exercises and pelvic tilts. States she can't do all the sets needed for her HEP as she has to break it up to tolerate it.    Patient Stated Goals would be able to make it through the day with normal household activities and can't wash dishes all at once.    Currently in Pain? Yes    Pain Score 8     Pain Location Neck   entire right side   Pain Orientation Right    Pain Onset More than a month ago                [facility] PT Assessment - 02/11/21 0001       Assessment   Medical Diagnosis neck pain and back pain    Referring Provider (PT) Lindell Spar      Observation/Other Assessments   Observations able to maintain seated posture for 37 seconds - painful                           [facility] Adult PT Treatment/Exercise - 02/11/21 0001       Neck Exercises: Seated   Other Seated Exercise TRA activation in standign and seated - 5 minutes practice    Other Seated Exercise glute squeezes x15 5" holds, hip add iso with ball - 2x10 5" holds towel              reviewed HEP        PT Education - 02/11/21 1318     Education Details on current presentation, on HEP, on POC, on plan post injection    Person(s) Educated Patient    Methods Explanation    Comprehension Verbalized understanding              PT Short Term Goals - 02/11/21 1101       PT SHORT TERM GOAL #1   Title Patient will report at least 25% improvement in symptoms for  improved quality of life.    Baseline 25% better    Time 3    Period Weeks    Status Achieved    Target Date 02/03/21      PT SHORT TERM GOAL #2   Title Patient will be independent in self management strategies to improve quality of life and functional outcomes.    Time 3    Period Weeks    Status Partially Met  Target Date 02/03/21      PT SHORT TERM GOAL #3   Title Patient will be able to rotate head at least 45 degrees bilaterally    Time 3    Period Weeks    Status Achieved   able to completed when she is not thinking about it.   Target Date 02/03/21               PT Long Term Goals - 02/11/21 1101       PT LONG TERM GOAL #1   Title Patient will be able to sit with good posture for at least 45 seconds to demonstrate improved sitting posture.    Baseline currently  seconds    Time 6    Period Weeks    Status Not Met      PT LONG TERM GOAL #2   Title Patient will be able to demonstrate good TRA activation in sitting and standign to improve standing tolerance for chores.    Baseline able to perform seated but unable to perform standing    Time 6    Period Weeks    Status Partially Met      PT LONG TERM GOAL #3   Title Patient will report at least 50% improvement in overall symptoms and/or function to demonstrate improved functional mobility    Baseline 25% better    Time 6    Period Weeks    Status Not Met                   Plan - 02/11/21 1053     Clinical Impression Statement Patient with minimal improvement and tolerance to current interventions. Focused on education and seeing how injection helps with symptoms and tolerance to exercises. Patient agreed with plan. Patient to discharge from PT to HEP secondary to lack of progress, patient with 2 visits left this calendar year, encouraged patient to return to therapy if exercise tolerance improves post injection on 11/28. Answered all questions prior to end of session.    Personal Factors and  Comorbidities Comorbidity 1;Time since onset of injury/illness/exacerbation    Comorbidities abdominal surgery    Examination-Activity Limitations Bend;Carry;Squat;Stairs;Lift;Locomotion Level;Transfers    Examination-Participation Restrictions Cleaning;Community Activity;Meal Prep;Laundry    Stability/Clinical Decision Making Evolving/Moderate complexity    Rehab Potential Fair    PT Frequency 2x / week    PT Duration 6 weeks    PT Treatment/Interventions ADLs/Self Care Home Management;Aquatic Therapy;Cryotherapy;Electrical Stimulation;DME Instruction;Traction;Moist Heat;Gait training;Stair training;Functional mobility training;Therapeutic activities;Therapeutic exercise;Balance training;Patient/family education;Neuromuscular re-education;Manual techniques;Passive range of motion;Taping;Energy conservation;Dry needling;Spinal Manipulations;Joint Manipulations    PT Next Visit Plan DC to HEP    PT Home Exercise Plan posture 10/12 supine chin tuck, scap retraction, ab brace 11/2 pelvic tilt, glute set    Consulted and Agree with Plan of Care Patient             Patient will benefit from skilled therapeutic intervention in order to improve the following deficits and impairments:  Abnormal gait, Decreased activity tolerance, Decreased balance, Decreased mobility, Decreased endurance, Decreased strength, Difficulty walking, Impaired perceived functional ability, Postural dysfunction, Improper body mechanics, Pain, Decreased range of motion, Decreased scar mobility  Visit Diagnosis: Cervicalgia  Difficulty in walking, not elsewhere classified  Chronic midline low back pain, unspecified whether sciatica present  Muscle weakness (generalized)     Problem List Patient Active Problem List   Diagnosis Date Noted   Upper abdominal pain 01/07/2021   Loss of weight 01/07/2021   At  risk for sleep apnea 12/18/2020   Neck pain 12/18/2020   Asthmatic bronchitis , chronic (HCC) 10/17/2020    Left maxillary sinusitis 10/17/2020   Chronic venous insufficiency 10/08/2020   Major depressive disorder 09/10/2020   PTSD (post-traumatic stress disorder) 09/10/2020   Fibromyalgia    Leg swelling 08/15/2020   Tobacco abuse 08/15/2020   Abdominal pain, epigastric 05/29/2020   Dysphagia 05/29/2020   Iron deficiency anemia 05/13/2020   Asthma due to seasonal allergies 04/17/2020   Constipation 03/12/2020   Deep vein thrombosis of portal vein 03/06/2020   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   Macrocytic anemia 03/06/2020   Polyarthralgia 03/06/2020   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   Chronic liver disease 78/24/2353   Alcoholic hepatitis with ascites 10/05/2016   History of marijuana use 01/20/2015   Abnormal CT scan, colon 01/08/2015   Generalized anxiety disorder with panic attacks    Thyroid nodule 01/11/2014   SVT (supraventricular tachycardia) ([place]) 09/07/2012   GERD (gastroesophageal reflux disease) 07/21/2012   Alcohol abuse, in remission    RUQ pain 10/28/2011   1:23 PM, 02/11/21 Jerene Pitch, DPT Physical Therapy with [facility]  [phone] office   [facility] [address] [place], [place], [address] Phone: [phone]   Fax:  [phone]  Name: Kara Stewart MRN: [id number] Date of Birth: March 29, 1976

## 2021-02-12 ENCOUNTER — Other Ambulatory Visit (HOSPITAL_COMMUNITY)
Admission: RE | Admit: 2021-02-12 | Discharge: 2021-02-12 | Disposition: A | Discharge status: Home / Self Care | Payer: Medicaid Other | Source: Ambulatory Visit | Attending: Adult Health | Admitting: Adult Health

## 2021-02-12 ENCOUNTER — Ambulatory Visit: Payer: Medicaid Other | Admitting: Adult Health

## 2021-02-12 ENCOUNTER — Encounter: Payer: Self-pay | Admitting: Adult Health

## 2021-02-12 ENCOUNTER — Telehealth: Payer: Self-pay | Admitting: Internal Medicine

## 2021-02-12 VITALS — BP 108/71 | HR 73 | Ht 64.0 in | Wt 127.5 lb

## 2021-02-12 DIAGNOSIS — Z0000 Encounter for general adult medical examination without abnormal findings: Secondary | ICD-10-CM | POA: Insufficient documentation

## 2021-02-12 DIAGNOSIS — N921 Excessive and frequent menstruation with irregular cycle: Secondary | ICD-10-CM

## 2021-02-12 DIAGNOSIS — Z1231 Encounter for screening mammogram for malignant neoplasm of breast: Secondary | ICD-10-CM

## 2021-02-12 DIAGNOSIS — Z1211 Encounter for screening for malignant neoplasm of colon: Secondary | ICD-10-CM | POA: Diagnosis not present

## 2021-02-12 DIAGNOSIS — Z01419 Encounter for gynecological examination (general) (routine) without abnormal findings: Secondary | ICD-10-CM | POA: Diagnosis not present

## 2021-02-12 LAB — HEMOCCULT GUIAC POC 1CARD (OFFICE): Fecal Occult Blood, POC: NEGATIVE

## 2021-02-12 NOTE — Telephone Encounter (Signed)
This is something that front staff should be sending back if the records request was received

## 2021-02-12 NOTE — Progress Notes (Signed)
Patient ID: Kara Stewart, female   DOB: 1976/02/25, 45 y.o.   MRN: [id number] History of Present Illness:  Kara Stewart is a 45 year old white female,divorced with boyfriend, [id number], in for well woman gyn exam and pap.  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 She feels very forgetful and off balance, and can't feel legs, sometimes, she is using a cane and sees neurologist she said. She says has decreased appetite and has lost about 80 lbs in last year.Has PTSD and cirrhosis. Has stomach pain seeing GI.  Has breast pain with ovulation and period, and period twice a month and heavy at times, may change every hour.   Current Medications, Allergies, Past Medical History, Past Surgical History, Family History and Social History were reviewed in [employer] record.     Review of Systems:  Patient denies any headaches, hearing loss, fatigue, blurred vision, shortness of breath, chest pain,  problems with bowel movements, urination, or intercourse.(Does not feel sex except in certain positions). No joint pain or mood swings.  See HPI for positives.  Physical Exam:BP 108/71 (BP Location: Left Arm, Patient Position: Sitting, Cuff Size: Normal)   Pulse 73   Ht 5\' 4"  (1.626 m)   Wt 127 lb 8 oz (57.8 kg)   LMP 01/20/2021   Breastfeeding No   BMI 21.89 kg/m   General:  Well developed, well nourished, no acute distress Skin:  Warm and dry Neck:  Midline trachea, normal thyroid, good ROM, no lymphadenopathy Lungs; Clear to auscultation bilaterally Breast:  No dominant palpable mass, retraction, or nipple discharge Cardiovascular: Regular rate and rhythm Abdomen:  Soft, non tender, no hepatosplenomegaly,+tender RLQ just below waist line. Pelvic:  External genitalia is normal in appearance, no lesions.  The vagina is normal in appearance. Urethra has no lesions or masses. The cervix is  bulbous.Pap with GC/CHL and HR HPV genotyping performed.  Uterus is felt to be normal size, shape, and contour.  No adnexal masses or tenderness noted.Bladder is non tender, no masses felt. Rectal: Good sphincter tone, no polyps, or hemorrhoids felt.  Hemoccult negative. Extremities/musculoskeletal:  No swelling or varicosities noted, no clubbing or cyanosis Psych:  No mood changes, alert and cooperative,seems happy AA is 0  Fall risk is high Depression screen [facility] 2/9 02/12/2021 12/18/2020 08/27/2020  Decreased Interest 0 0 0  Down, Depressed, Hopeless 0 0 0  PHQ - 2 Score 0 0 0  Altered sleeping 0 - -  Tired, decreased energy 0 - -  Change in appetite 0 - -  Feeling bad or failure about yourself  0 - -  Trouble concentrating 0 - -  Moving slowly or fidgety/restless 0 - -  Suicidal thoughts 0 - -  PHQ-9 Score 0 - -  Some encounter information is confidential and restricted. Go to Review Flowsheets activity to see all data.  Some recent data might be hidden    GAD 7 : Generalized Anxiety Score 02/12/2021 12/22/2018 06/08/2018 05/03/2018  Nervous, Anxious, on Edge 0 3 3 2   Control/stop worrying 0 2 2 2   Worry too much - different things 0 2 3 2   Trouble relaxing 0 3 3 2   Restless 0 0 1 0  Easily annoyed or irritable 0 2 3 3   Afraid - awful might happen 0 0 0 -  Total GAD 7 Score 0 12 15 -  Upstream - 02/12/21 1022       Pregnancy Intention Screening   Does the patient want to become pregnant in the next year? No    Does the patient's partner want to become pregnant in the next year? No    Would the patient like to discuss contraceptive options today? No      Contraception Wrap Up   Current Method Female Sterilization    End Method Female Sterilization    Contraception Counseling Provided No            Examination chaperoned by Levy Pupa LPN   Impression and Plan: 1. Routine general medical examination at a health care facility Pap sent  - Cytology - PAP( [facility])  2. Encounter for screening fecal occult blood testing  - POCT occult blood stool  3. Menorrhagia with irregular cycle Will get pelvic US 02/17/21 at [facility] at 2:30 pm, will talk when results back.  - US PELVIC COMPLETE WITH TRANSVAGINAL; Future  4. Screening mammogram for breast cancer Will get mammogram 02/17/21 at [facility] after [place] - MM 3D SCREEN BREAST BILATERAL; Future    5. Encounter for gynecological examination with Papanicolaou smear of cervix GYN physical in 1 year Pap in 3 if normal Labs with PCP Colonoscopy per Gi

## 2021-02-12 NOTE — Telephone Encounter (Signed)
Kara Stewart with Disability Determination services called on behalf of patient.  DDS is looking for records on pt. States they received fax but no records with the fax.    Call back # [phone]  Fax # [phone]

## 2021-02-13 ENCOUNTER — Encounter (HOSPITAL_COMMUNITY): Payer: Medicaid Other | Admitting: Physical Therapy

## 2021-02-13 ENCOUNTER — Encounter: Payer: Self-pay | Admitting: Internal Medicine

## 2021-02-13 LAB — CYTOLOGY - PAP
Chlamydia: NEGATIVE
Comment: NEGATIVE
Comment: NEGATIVE
Comment: NORMAL
Diagnosis: NEGATIVE
High risk HPV: NEGATIVE
Neisseria Gonorrhea: NEGATIVE

## 2021-02-17 ENCOUNTER — Ambulatory Visit (HOSPITAL_COMMUNITY)
Admission: RE | Admit: 2021-02-17 | Discharge: 2021-02-17 | Disposition: A | Discharge status: Home / Self Care | Payer: Medicaid Other | Source: Ambulatory Visit | Attending: Adult Health | Admitting: Adult Health

## 2021-02-17 ENCOUNTER — Encounter (HOSPITAL_COMMUNITY): Payer: Self-pay

## 2021-02-17 ENCOUNTER — Other Ambulatory Visit: Payer: Self-pay

## 2021-02-17 ENCOUNTER — Ambulatory Visit (INDEPENDENT_AMBULATORY_CARE_PROVIDER_SITE_OTHER): Payer: Medicaid Other | Admitting: Psychiatry

## 2021-02-17 DIAGNOSIS — N921 Excessive and frequent menstruation with irregular cycle: Secondary | ICD-10-CM | POA: Insufficient documentation

## 2021-02-17 DIAGNOSIS — N939 Abnormal uterine and vaginal bleeding, unspecified: Secondary | ICD-10-CM | POA: Diagnosis not present

## 2021-02-17 DIAGNOSIS — F4310 Post-traumatic stress disorder, unspecified: Secondary | ICD-10-CM | POA: Diagnosis not present

## 2021-02-17 DIAGNOSIS — Z1231 Encounter for screening mammogram for malignant neoplasm of breast: Secondary | ICD-10-CM | POA: Insufficient documentation

## 2021-02-17 DIAGNOSIS — F321 Major depressive disorder, single episode, moderate: Secondary | ICD-10-CM

## 2021-02-17 DIAGNOSIS — N920 Excessive and frequent menstruation with regular cycle: Secondary | ICD-10-CM | POA: Diagnosis not present

## 2021-02-17 NOTE — Progress Notes (Signed)
Virtual Visit via Telephone Note  I connected with Kara Stewart on 02/17/21 at 11:10 AM EDT by telephone and verified that I am speaking with the correct person using two identifiers.  Location: Patient: Home Provider: [facility] [place] office   I discussed the limitations, risks, security and privacy concerns of performing an evaluation and management service by telephone and the availability of in person appointments. I also discussed with the patient that there may be a patient responsible charge related to this service. The patient expressed understanding and agreed to proceed.  I provided 45  minutes of non-face-to-face time during this encounter.   Adah Salvage, LCSW        THERAPIST PROGRESS NOTE  Session Time: Monday 02/17/2021  11:10 AM -  11:55 PM   Participation Level: Active  Behavioral Response: CasualAlertAnxious and Depressed  Type of Therapy: Individual Therapy  Treatment Goals addressed: Establish therapeutic alliance, learn and implement cognitive and behavioral strategies to cope with stress and anxiety so that it does not interfere with daily functioning  Interventions: CBT and Supportive  Summary: Kara Stewart is a 45 y.o. female who is referred for services by PCP Dr. Allena Katz due to pt experiencing symptoms of depression and anxiety. She denies any psychiatric hospitalizations, She reports participating in therapy through [facility] and last was seen 10 years ago.  Patient states her everyday life is up in the air and she just cannot get it together.  She reports multiple physical ailments and was diagnosed with primary biliary cirrhosis.  She reports worrying about how much longer she has to live.  Current symptoms and problems include emotional outburst, becoming overly emotional about small things, becoming irritated easily, difficulty completing task around the house, and feeling overwhelmed.  Patient also presents with a trauma history.  Patient  last was seen in the office about a month ago.  She reports no change in symptoms since last session.  She continues to experience ruminating thoughts and panic-like symptoms at least 4 to 5 days/week.  She reports trying to practice deep breathing during the episode but says it does not help.  However, she reports that it does help her to try to avoid hyperventilating as she focuses on her breathing.  She reports current medication, Zoloft, does not seem to be effective.  She reports multiple stressors including continued financial issues and health issues.  She is particularly concerned about her health now as recent test indicate abnormalities in her breast.  She is scheduled to follow-up with her OB/GYN this afternoon.     Suicidal/Homicidal: Nowithout intent/plan  Therapist Response: Reviewed symptoms, discussed stressors, facilitated expression of thoughts and feelings, validated feelings, praised and reinforced patient's efforts to practice deep breathing, developed plan with patient to continue practicing, developed treatment plan with patient, obtained patient's verbal consent/agreement to plan, sent patient copy via MyChart, began to provide psychoeducation on anxiety and panic attacks, will send patient handouts via mail    Plan: Return again in 2 weeks.  Diagnosis: Axis I: PTSD, MDD        Adah Salvage, LCSW 02/17/2021

## 2021-02-17 NOTE — Plan of Care (Signed)
Patient participated in development of plan. °

## 2021-02-18 ENCOUNTER — Encounter (HOSPITAL_COMMUNITY): Payer: Medicaid Other | Admitting: Physical Therapy

## 2021-02-20 ENCOUNTER — Encounter (HOSPITAL_COMMUNITY): Payer: Medicaid Other | Admitting: Physical Therapy

## 2021-02-24 ENCOUNTER — Encounter: Payer: Self-pay | Admitting: Internal Medicine

## 2021-02-24 ENCOUNTER — Other Ambulatory Visit: Payer: Self-pay

## 2021-02-24 ENCOUNTER — Ambulatory Visit (INDEPENDENT_AMBULATORY_CARE_PROVIDER_SITE_OTHER): Payer: Medicaid Other | Admitting: Internal Medicine

## 2021-02-24 ENCOUNTER — Encounter (HOSPITAL_COMMUNITY): Payer: Medicaid Other | Admitting: Physical Therapy

## 2021-02-24 DIAGNOSIS — F4310 Post-traumatic stress disorder, unspecified: Secondary | ICD-10-CM | POA: Diagnosis not present

## 2021-02-24 DIAGNOSIS — F410 Panic disorder [episodic paroxysmal anxiety] without agoraphobia: Secondary | ICD-10-CM

## 2021-02-24 DIAGNOSIS — J320 Chronic maxillary sinusitis: Secondary | ICD-10-CM

## 2021-02-24 DIAGNOSIS — F411 Generalized anxiety disorder: Secondary | ICD-10-CM

## 2021-02-24 DIAGNOSIS — F332 Major depressive disorder, recurrent severe without psychotic features: Secondary | ICD-10-CM | POA: Diagnosis not present

## 2021-02-24 MED ORDER — BUSPIRONE HCL 7.5 MG PO TABS: 7.5000 mg | ORAL_TABLET | Freq: Two times a day (BID) | ORAL | 2 refills | Status: DC

## 2021-02-24 MED ORDER — SERTRALINE HCL 50 MG PO TABS
50.0000 mg | ORAL_TABLET | Freq: Every day | ORAL | 3 refills | Status: DC
Start: 2021-02-24 — End: 2021-06-27

## 2021-02-24 NOTE — Progress Notes (Signed)
Virtual Visit via Telephone Note   This visit type was conducted due to national recommendations for restrictions regarding the COVID-19 Pandemic (e.g. social distancing) in an effort to limit this patient's exposure and mitigate transmission in our community.  Due to her co-morbid illnesses, this patient is at least at moderate risk for complications without adequate follow up.  This format is felt to be most appropriate for this patient at this time.  The patient did not have access to video technology/had technical difficulties with video requiring transitioning to audio format only (telephone).  All issues noted in this document were discussed and addressed.  No physical exam could be performed with this format.  Evaluation Performed:  Follow-up visit  Date:  02/24/2021   ID:  Kara Stewart, DOB Apr 18, 1975, MRN [id number]  Patient Location: Home Provider Location: Office/Clinic  Participants: Patient Location of Patient: Home Location of Provider: Telehealth Consent was obtain for visit to be over via telehealth. I verified that I am speaking with the correct person using two identifiers.  PCP:  Anabel Halon, MD   Chief Complaint: Anxiety, sinusitis and question regarding Eliquis  History of Present Illness:    Kara Stewart is a 45 y.o. female who has a televisit for complaint of anxiety with depression.  She was started on Zoloft in the last visit and has seen mild improvement in terms of irritation.  She has been talking to [facility] therapist, who has advised deep breathing exercises and relaxation techniques for anxiety.  She still has episodes of severe anxiety/panic episodes, when she has tremors/jitteriness.  Denies any dyspnea or worsening of palpitations, chest pain or chest tightness.  She denies any SI or HI currently.  She had an allergic reaction to Avelox in the last month.  She states that her sinus symptoms are worse in the morning and do not improve during the  daytime.  She prefers to see ENT specialist for chronic sinusitis.  Denies any recent fever, chills, sore throat, nausea or vomiting.  She has seen spine surgery for chronic low back pain.  They advised trial of epidural injection before surgery, but she needs to call off of Eliquis for 3 days before the procedure.  She takes Eliquis for history of portal vein thrombosis.  I had discussion with her about the risks and benefits of holding Eliquis for the intended procedure.  She prefers to hold it understanding slight higher risk of recurrent blood clot.  The patient does not have symptoms concerning for COVID-19 infection (fever, chills, cough, or new shortness of breath).   Past Medical, Surgical, Social History, Allergies, and Medications have been Reviewed.  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 Treating with URSO due to elevated AMA. LFTs slowly improving. Concern for recurrent non-tense ascites on exam: patient feels uncomfortable and requesting paracentesis. This is likely   Asthma due to seasonal allergies 04/17/2020   Back pain    Chronic liver disease 11/16/2017   Deep vein thrombosis of portal vein 03/06/2020   Dysphagia 05/29/2020   Fibromyalgia    Generalized anxiety disorder with panic attacks    GERD (gastroesophageal reflux disease) 07/21/2012   Hiatal hernia    History of alcohol abuse 04/26/2015   History of colitis 11/29/2014   History  of marijuana use 01/20/2015   Hypertriglyceridemia    Hypothyroid    Iritis    frequent   Iron deficiency anemia 05/13/2020   Macrocytic anemia 03/06/2020   Major depressive disorder 09/10/2020   Ovarian cyst    Polyarthralgia 03/06/2020   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   PTSD (post-traumatic  stress disorder) 09/10/2020   S/P closed fracture of wrist 03/06/2020   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   SVT (supraventricular tachycardia)    last issue 12/2017   Thyroid nodule 01/11/2014   Past Surgical History:  Procedure Laterality Date   BIOPSY N/A 02/05/2015   Procedure: BIOPSY;  Surgeon: West Bali, MD;  Location: [facility];  Service: Endoscopy;  Laterality: N/A;   COLONOSCOPY     COLONOSCOPY WITH PROPOFOL N/A 02/05/2015   ZHG:DJMEQ HH/mild diverticulosis   ESOPHAGEAL DILATION N/A 11/21/2015   Procedure: ESOPHAGEAL DILATION;  Surgeon: Corbin Ade, MD;  Location: [facility];  Service: Endoscopy;  Laterality: N/A;   ESOPHAGOGASTRODUODENOSCOPY  11/06/2011   [name]: MILD Esophagitis/PATENT ESOPHAGEAL Stricture/  Moderate gastritis. Bx no.hpylori or celiac, +gastritis   ESOPHAGOGASTRODUODENOSCOPY (EGD) WITH PROPOFOL N/A 03/20/2014   [name]: 1. Mild esophagitis & distal esophagela stricture. 2. small hiatal hernia 3. moderate non-erosive gastritis and mild duodenits   ESOPHAGOGASTRODUODENOSCOPY (EGD) WITH PROPOFOL N/A 11/21/2015   Dr. Jena Gauss: LA grade B esophagitis, MW tear likely source of hematemesis   LAPAROSCOPIC TUBAL LIGATION Bilateral 09/14/2018   Procedure: LAPAROSCOPIC TUBAL LIGATION;  Surgeon: Allie Bossier, MD;  Location: [facility];  Service: Gynecology;  Laterality: Bilateral;   LIVER BIOPSY  02/23/2020   Procedure: OPEN LIVER BIOPSY;  Surgeon: Fritzi Mandes, MD;  Location: [facility];  Service: General;;   SAVORY DILATION N/A 03/20/2014   Procedure: SAVORY DILATION;  Surgeon: West Bali, MD;  Location: [facility];  Service: Endoscopy;  Laterality: N/A;  dilated with # 12.8, 14,15,16   SPLENECTOMY, TOTAL N/A 02/23/2020   Procedure: SPLENECTOMY;  Surgeon: Fritzi Mandes, MD;  Location: [facility];  Service: General;  Laterality: N/A;   TOOTH EXTRACTION       Current Meds  Medication Sig   acetaminophen (TYLENOL) 325 MG tablet Take 2 tablets (650 mg total) by mouth every 6  (six) hours as needed.   azelastine (ASTELIN) 0.1 % nasal spray Place 1 spray into both nostrils 2 (two) times daily. Use in each nostril as directed (Patient taking differently: Place 1 spray into both nostrils as needed. Use in each nostril as directed)   budesonide-formoterol (SYMBICORT) 80-4.5 MCG/ACT inhaler Take 2 puffs first thing in am and then another 2 puffs about 12 hours later.   Calcium Carb-Cholecalciferol (CALCIUM/VITAMIN D PO) Take by mouth daily.   cholecalciferol (VITAMIN D3) 25 MCG (1000 UNIT) tablet Take 1 tablet (1,000 Units total) by mouth daily. (Patient taking differently: Take 2,000 Units by mouth daily.)   cyanocobalamin (,VITAMIN B-12,) 1000 MCG/ML injection Inject into the muscle every 30 (thirty) days.   ELIQUIS 5 MG TABS tablet TAKE 1 TABLET(5 MG) BY MOUTH TWICE DAILY   FOLIC ACID PO Take by mouth.   gabapentin (NEURONTIN) 100 MG capsule 2 CAPSULES AT BEDTIME   loratadine (CLARITIN) 10 MG tablet Take 10 mg by mouth daily as needed.   methocarbamol (ROBAXIN) 500 MG tablet TAKE 1 TABLET BY MOUTH FOUR TIMES DAILY AS NEEDED   MILK THISTLE PO Take by mouth daily.   ondansetron (ZOFRAN-ODT) 4 MG disintegrating tablet Take 1 tablet (4 mg total) by mouth  every 8 (eight) hours as needed for nausea or vomiting.   OVER THE COUNTER MEDICATION Premeir protein drink once a day   pantoprazole (PROTONIX) 40 MG tablet Take 1 tablet (40 mg total) by mouth 2 (two) times daily before a meal.   PROAIR HFA 108 (90 Base) MCG/ACT inhaler INHALE 2 PUFFS INTO THE LUNGS EVERY 6 HOURS AS NEEDED FOR WHEEZING OR SHORTNESS OF BREATH   sertraline (ZOLOFT) 50 MG tablet Take 1 tablet (50 mg total) by mouth daily.   traMADol (ULTRAM) 50 MG tablet Take 1 tablet (50 mg total) by mouth every 6 (six) hours as needed.   UNABLE TO FIND Iron infusion-every 3 months   ursodiol (ACTIGALL) 500 MG tablet TAKE 1 TABLET BY MOUTH TWICE DAILY     Allergies:   Penicillins, Avelox [moxifloxacin], and Lidocaine    ROS:   Please see the history of present illness.     All other systems reviewed and are negative.   Labs/Other Tests and Data Reviewed:    Recent Labs: 02/26/2020: Magnesium 2.1 12/06/2020: ALT 13; BUN 9; Creatinine, Ser 0.72; Hemoglobin 14.7; Platelets 354; Potassium 4.3; Sodium 136 01/22/2021: TSH 0.882   Recent Lipid Panel Lab Results  Component Value Date/Time   CHOL 166 01/27/2017 12:05 PM   TRIG 103 01/27/2017 12:05 PM   HDL 45 (L) 01/27/2017 12:05 PM   CHOLHDL 3.7 01/27/2017 12:05 PM   LDLCALC 101 (H) 01/27/2017 12:05 PM    Wt Readings from Last 3 Encounters:  02/12/21 127 lb 8 oz (57.8 kg)  01/07/21 129 lb (58.5 kg)  12/18/20 132 lb (59.9 kg)     ASSESSMENT & PLAN:    Major depressive disorder Mild improvement with Zoloft 50 mg daily Continue to follow-up with [place] therapist  Generalized anxiety disorder with panic attacks Continue Zoloft Added BuSpar 7.5 mg twice daily Follow-up with [facility] therapist Relaxation techniques and deep breathing exercises as advised by [facility] therapist  Chronic sinusitis Continue to use Astelin nasal spray Referred to ENT Advised to use vaporizer or humidifier at nighttime   Time:   Today, I have spent 22 minutes reviewing the chart, including problem list, medications, and with the patient with telehealth technology discussing the above problems.   Medication Adjustments/Labs and Tests Ordered: Current medicines are reviewed at length with the patient today.  Concerns regarding medicines are outlined above.   Tests Ordered: No orders of the defined types were placed in this encounter.   Medication Changes: No orders of the defined types were placed in this encounter.    Note: This dictation was prepared with Dragon dictation along with smaller phrase technology. Similar sounding words can be transcribed inadequately or may not be corrected upon review. Any transcriptional errors that result from this process are  unintentional.      Disposition:  Follow up  Signed, Lindell Spar, MD  02/24/2021 12:22 PM     [facility]

## 2021-02-24 NOTE — Assessment & Plan Note (Signed)
Mild improvement with Zoloft 50 mg daily Continue to follow-up with [facility] therapist

## 2021-02-24 NOTE — Patient Instructions (Signed)
Please continue taking Zoloft. Please start taking Buspar for anxiety.  Please continue to follow up with Behavioral health therapist.  Please start using the vaporizer or humidifier at nighttime to help with nasal congestion.  You are being referred to ENT specialist.

## 2021-02-24 NOTE — Assessment & Plan Note (Signed)
Continue Zoloft Added BuSpar 7.5 mg twice daily Follow-up with [facility] therapist Relaxation techniques and deep breathing exercises as advised by [facility] therapist

## 2021-02-26 ENCOUNTER — Encounter (HOSPITAL_COMMUNITY): Payer: Medicaid Other | Admitting: Physical Therapy

## 2021-03-05 ENCOUNTER — Ambulatory Visit: Payer: Medicaid Other

## 2021-03-05 ENCOUNTER — Other Ambulatory Visit: Payer: Self-pay

## 2021-03-05 ENCOUNTER — Encounter: Payer: Self-pay | Admitting: Orthopedic Surgery

## 2021-03-05 ENCOUNTER — Ambulatory Visit: Payer: Medicaid Other | Admitting: Orthopedic Surgery

## 2021-03-05 VITALS — BP 109/63 | HR 73 | Ht 64.0 in | Wt 130.0 lb

## 2021-03-05 DIAGNOSIS — W19XXXA Unspecified fall, initial encounter: Secondary | ICD-10-CM

## 2021-03-05 DIAGNOSIS — S322XXA Fracture of coccyx, initial encounter for closed fracture: Secondary | ICD-10-CM

## 2021-03-05 MED ORDER — TRAMADOL HCL 50 MG PO TABS: 50.0000 mg | ORAL_TABLET | Freq: Four times a day (QID) | ORAL | 0 refills | Status: DC | PRN

## 2021-03-05 NOTE — Progress Notes (Signed)
Orthopaedic Clinic Return  Assessment: Kara Stewart is a 45 y.o. female with the following: Minimally displaced coccyx fracture   Plan: Radiographs were reviewed with the patient in clinic today.  The nature of her injury was also discussed.  Limited intervention necessary for coccyx fracture.  Anticipate that the pain and swelling will improve with time.  Provided her with a prescription for a donut pillow, to be used at all times to alleviate pressure directly on the injured portion of the coccyx.  She stated understanding.  I have also provided her with a limited prescription for tramadol, and urged her to continue to wean off this medication.  She stated understanding.  We will see her back in approximately 6 weeks for repeat evaluation.  Meds ordered this encounter  Medications   traMADol (ULTRAM) 50 MG tablet    Sig: Take 1 tablet (50 mg total) by mouth every 6 (six) hours as needed.    Dispense:  20 tablet    Refill:  0    Follow-up: Return in about 6 weeks (around 04/16/2021).   Subjective:  Chief Complaint  Patient presents with   buttock pain    S/p fall DOI11/26/22    History of Present Illness: Kara Stewart is a 45 y.o. female who returns to clinic for evaluation of buttock pain.  She is well-known to my clinic, having seen me for a number of injuries including a left distal radius fracture and some neck pain.  She continues to recover from major surgery, complicated by blood clots.  She states that she fell within the last week.  She landed directly onto her buttocks.  Since then, she has had severe pain in her lower back and buttocks area.  She does have tenderness to palpation.  She finds it very difficult to sit normally.  She has difficulty laying down, once again due to the pain.  There is no rating pain distally into her legs.  She is taking Tylenol and muscle relaxers as needed.  Review of Systems: No fevers or chills No numbness or tingling No chest  pain No shortness of breath No bowel or bladder dysfunction No GI distress No headaches   Objective: BP 109/63   Pulse 73   Ht 5\' 4"  (1.626 m)   Wt 130 lb (59 kg)   BMI 22.31 kg/m   Physical Exam:  Alert and oriented.  No acute distress.  Unable to sit upright with equal pressure on both buttocks.  She has tenderness to palpation within the midline.  Small amount of ecchymosis is appreciated in her lower back buttock area.  Negative straight leg raise bilaterally.  Sensation is intact distally in all nerve distributions.  IMAGING: I personally ordered and reviewed the following images:  X-rays of the sacrum and coccyx were obtained in clinic today.  There does appear to be a minimally displaced fracture through the proximal aspect of the coccyx, which is most apparent on the lateral x-rays.  There is no distraction of the fracture.  It is not flexed.  No other injuries are noted.  Impression: Minimally displaced fracture of the coccyx.   , MD 03/05/2021 12:57 PM

## 2021-03-05 NOTE — Patient Instructions (Signed)
Use pillow when sitting  Can use a neck pillow as needed also  Tramadol as needed

## 2021-03-13 ENCOUNTER — Inpatient Hospital Stay (HOSPITAL_COMMUNITY): Payer: Medicaid Other | Attending: Hematology

## 2021-03-13 ENCOUNTER — Ambulatory Visit (INDEPENDENT_AMBULATORY_CARE_PROVIDER_SITE_OTHER): Payer: Medicaid Other | Admitting: Psychiatry

## 2021-03-13 ENCOUNTER — Other Ambulatory Visit: Payer: Self-pay

## 2021-03-13 DIAGNOSIS — F321 Major depressive disorder, single episode, moderate: Secondary | ICD-10-CM | POA: Diagnosis not present

## 2021-03-13 DIAGNOSIS — D75839 Thrombocytosis, unspecified: Secondary | ICD-10-CM | POA: Diagnosis not present

## 2021-03-13 DIAGNOSIS — D509 Iron deficiency anemia, unspecified: Secondary | ICD-10-CM | POA: Insufficient documentation

## 2021-03-13 DIAGNOSIS — F4310 Post-traumatic stress disorder, unspecified: Secondary | ICD-10-CM

## 2021-03-13 DIAGNOSIS — I81 Portal vein thrombosis: Secondary | ICD-10-CM | POA: Insufficient documentation

## 2021-03-13 DIAGNOSIS — D72829 Elevated white blood cell count, unspecified: Secondary | ICD-10-CM | POA: Diagnosis not present

## 2021-03-13 DIAGNOSIS — E559 Vitamin D deficiency, unspecified: Secondary | ICD-10-CM

## 2021-03-13 LAB — CBC WITH DIFFERENTIAL/PLATELET
Abs Immature Granulocytes: 0.01 10*3/uL (ref 0.00–0.07)
Basophils Absolute: 0.1 10*3/uL (ref 0.0–0.1)
Basophils Relative: 1 %
Eosinophils Absolute: 0.2 10*3/uL (ref 0.0–0.5)
Eosinophils Relative: 2 %
HCT: 39.9 % (ref 36.0–46.0)
Hemoglobin: 13.7 g/dL (ref 12.0–15.0)
Immature Granulocytes: 0 %
Lymphocytes Relative: 41 %
Lymphs Abs: 3.7 10*3/uL (ref 0.7–4.0)
MCH: 32.7 pg (ref 26.0–34.0)
MCHC: 34.3 g/dL (ref 30.0–36.0)
MCV: 95.2 fL (ref 80.0–100.0)
Monocytes Absolute: 0.8 10*3/uL (ref 0.1–1.0)
Monocytes Relative: 9 %
Neutro Abs: 4.2 10*3/uL (ref 1.7–7.7)
Neutrophils Relative %: 47 %
Platelets: 355 10*3/uL (ref 150–400)
RBC: 4.19 MIL/uL (ref 3.87–5.11)
RDW: 12.7 % (ref 11.5–15.5)
WBC: 9 10*3/uL (ref 4.0–10.5)
nRBC: 0 % (ref 0.0–0.2)

## 2021-03-13 LAB — IRON AND TIBC
Iron: 125 ug/dL (ref 28–170)
Saturation Ratios: 46 % — ABNORMAL HIGH (ref 10.4–31.8)
TIBC: 269 ug/dL (ref 250–450)
UIBC: 144 ug/dL

## 2021-03-13 LAB — FERRITIN: Ferritin: 118 ng/mL (ref 11–307)

## 2021-03-13 LAB — VITAMIN D 25 HYDROXY (VIT D DEFICIENCY, FRACTURES): Vit D, 25-Hydroxy: 33.1 ng/mL (ref 30–100)
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
## 2021-03-14 ENCOUNTER — Other Ambulatory Visit: Payer: Self-pay | Admitting: Family Medicine

## 2021-03-19 NOTE — Progress Notes (Signed)
Virtual Visit via Telephone Note [facility]  I connected with Kara Stewart  on 03/20/21  at  8:25 AM by telephone and verified that I am speaking with the correct person using two identifiers.  Location: Patient: Home Provider: [facility]   I discussed the limitations, risks, security and privacy concerns of performing an evaluation and management service by telephone and the availability of in person appointments. I also discussed with the patient that there may be a patient responsible charge related to this service. The patient expressed understanding and agreed to proceed.   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 45 y.o. 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  She was last seen by Tarri Abernethy PA-C on 12/13/2020.  At today's visit, she reports feeling poorly secondary to her multiple chronic illnesses.  She reports that she is feeling more weak and fatigued than usual, and that she did not notice any significant improvement after her IV iron in September 2022.  She continues to take Eliquis as prescribed for her portal vein thrombosis.  She has not noticed any epistaxis, hematemesis, hematochezia, or melena.  She had previously complained of extremely heavy periods, but reports that she has not had a menstrual cycle for the past 2 months.  She wonders if she is approaching menopause, because she has also had hot flashes and night sweats for the last several months.  She has chronic headaches, lightheadedness, and progressive dyspnea on exertion (being worked up by her PCP and ENT).  She denies any pica, restless legs, chest  pain, syncope.  She reports 25% energy and 40% appetite.  She reports that her weight has been stable.    OBSERVATIONS/OBJECTIVE: Review of Systems  Constitutional:  Positive for diaphoresis and malaise/fatigue. Negative for chills, fever and weight loss.  HENT:         Difficulty chewing  Respiratory:  Positive for cough and shortness of breath.   Cardiovascular:  Negative for chest pain and palpitations.  Gastrointestinal:  Positive for nausea. Negative for abdominal pain, blood in stool, melena and vomiting.  Musculoskeletal:  Positive for joint pain.  Neurological:  Positive for dizziness, tingling (hands and feet) and headaches.  Psychiatric/Behavioral:  The patient has insomnia (difficulty staying asleep).     PHYSICAL EXAM (per limitations of virtual telephone visit): The patient is alert and oriented x 3, exhibiting adequate mentation, good mood, and ability to speak in full sentences and execute sound judgement.   ASSESSMENT & PLAN: 1.  Portal vein thrombosis - November 2021, patient fell and was found to have splenic rupture with thrombus in the main portal vein and central intrahepatic vein and branches, with edema around the pancreas, as well as fatty liver with early cirrhosis.  She had splenectomy as well as liver biopsy that showed steatohepatitis.  Further work-up by gastroenterology revealed [place] (primary biliary cirrhosis). - Hypercoagulable work-up was negative - She is stable on Eliquis, reports that she has not missed any doses since her last appointment - Denies easy bruising.  No major blood loss.     - PLAN: Continue Eliquis indefinitely.     2.  Iron deficiency anemia - Etiology of iron deficiency anemia suspected to be blood loss related to  menorrhagia - EGD (11/21/2015): Mallory-Weiss tear, likely cause of hematemesis - Colonoscopy (02/05/2015): Mild diverticulosis and small internal hemorrhoids - She was unable to tolerate oral iron tablets, but received IV  Feraheme on 05/14/2020 and 05/21/2020.  Most recent [facility] 12/17/2020. - She has heavy blood loss secondary to menorrhagia, but has not had a menstrual cycle for the past 2 months and wonders if she is approaching menopause.  She denies any signs of GI blood loss. - Her significant fatigue is multifactorial. - Most recent labs (03/13/2021): Hgb 13.7, normal CBC; ferritin 118, iron saturation 46 % - PLAN: No indication for IV iron at this time.  Repeat labs and RTC in 3 months. - Continue to recommend that patient speak with her GYN regarding treatment options for menorrhagia   3.  Leukocytosis and thrombocytosis, intermittent - Most recent labs (03/13/2021) shows normal CBC without leukocytosis or thrombocytosis -Intermittent leukocytosis and thrombocytosis are likely secondary to splenectomy - PLAN: Repeat CBC in 3 months.  No indication for further work-up (such as JAK2, flow cytometry, BCR/ABL) unless there is significant deviation from baseline.   4. Right leg varicose veins - Symptomatic varicose veins of right thigh  - Patient was concerned about blood clot, but venous duplex on 08/15/2020 was negative for right lower extremity DVT - PLAN: Referral has been sent to vascular surgery for further work-up and treatment.   5.  Folate deficiency: - Labs (12/06/2020): Normal folate 6.7 - PLAN: Continue daily multivitamin with folic acid.  Repeat folic acid at follow-up visit in 3 months.  6.  B12 deficiency: - Labs (12/06/2020): Normal B12 379, normal methylmalonic acid - PLAN: Continue monthly B12 injections at home.    Repeat B12 and methylmalonic acid in 3 months.   7.  Vitamin D deficiency  - Previous labs (12/06/2020) showed persistently low vitamin D (29.66) despite taking vitamin D1 1000 units daily - She was increased to 2000 units daily at her last visit - Most recent vitamin D (03/13/2021) improved and normal at 33.10 - PLAN: Continue vitamin D at 2000 units daily.  We will repeat vitamin D  in 3 months.   8.    Primary biliary cirrhosis and chronic abdominal pain: - Chronic and multifactorial secondary to primary biliary cirrhosis and splenectomy with postoperative changes (small irregular collection of left upper quadrant hematoma or seroma) - Follows with gastroenterology, and is on ursodiol - PLAN:  Continue follow-up with gastroenterology.      FOLLOW UP INSTRUCTIONS: Labs in 3 months Phone visit after labs    I discussed the assessment and treatment plan with the patient. The patient was provided an opportunity to ask questions and all were answered. The patient agreed with the plan and demonstrated an understanding of the instructions.   The patient was advised to call back or seek an in-person evaluation if the symptoms worsen or if the condition fails to improve as anticipated.  I provided 21 minutes of non-face-to-face time during this encounter.   Carnella Guadalajara, PA-C 03/20/2021 8:48 AM

## 2021-03-20 ENCOUNTER — Other Ambulatory Visit: Payer: Self-pay

## 2021-03-20 ENCOUNTER — Other Ambulatory Visit: Payer: Self-pay | Admitting: Orthopedic Surgery

## 2021-03-20 ENCOUNTER — Inpatient Hospital Stay (HOSPITAL_BASED_OUTPATIENT_CLINIC_OR_DEPARTMENT_OTHER): Payer: Medicaid Other | Admitting: Physician Assistant

## 2021-03-20 DIAGNOSIS — I83811 Varicose veins of right lower extremities with pain: Secondary | ICD-10-CM

## 2021-03-20 DIAGNOSIS — D75839 Thrombocytosis, unspecified: Secondary | ICD-10-CM | POA: Diagnosis not present

## 2021-03-20 DIAGNOSIS — E559 Vitamin D deficiency, unspecified: Secondary | ICD-10-CM

## 2021-03-20 DIAGNOSIS — D509 Iron deficiency anemia, unspecified: Secondary | ICD-10-CM | POA: Diagnosis not present

## 2021-03-20 DIAGNOSIS — K743 Primary biliary cirrhosis: Secondary | ICD-10-CM | POA: Diagnosis not present

## 2021-03-20 DIAGNOSIS — D72829 Elevated white blood cell count, unspecified: Secondary | ICD-10-CM | POA: Diagnosis not present

## 2021-03-20 DIAGNOSIS — I81 Portal vein thrombosis: Secondary | ICD-10-CM | POA: Diagnosis not present

## 2021-03-20 DIAGNOSIS — E538 Deficiency of other specified B group vitamins: Secondary | ICD-10-CM | POA: Diagnosis not present

## 2021-03-21 MED ORDER — TRAMADOL HCL 50 MG PO TABS: 50.0000 mg | ORAL_TABLET | Freq: Four times a day (QID) | ORAL | 0 refills | Status: DC | PRN

## 2021-03-27 ENCOUNTER — Other Ambulatory Visit: Payer: Self-pay

## 2021-03-27 ENCOUNTER — Ambulatory Visit (INDEPENDENT_AMBULATORY_CARE_PROVIDER_SITE_OTHER): Payer: Medicaid Other | Admitting: Psychiatry

## 2021-03-27 DIAGNOSIS — F321 Major depressive disorder, single episode, moderate: Secondary | ICD-10-CM | POA: Diagnosis not present

## 2021-03-27 DIAGNOSIS — F4310 Post-traumatic stress disorder, unspecified: Secondary | ICD-10-CM | POA: Diagnosis not present

## 2021-03-27 NOTE — Progress Notes (Signed)
Virtual Visit via Telephone Note  I connected with Kara Stewart on 03/27/21 at 10:12 AM EST by telephone and verified that I am speaking with the correct person using two identifiers.  Location: Patient: Home Provider: [facility] [place] office    I discussed the limitations, risks, security and privacy concerns of performing an evaluation and management service by telephone and the availability of in person appointments. I also discussed with the patient that there may be a patient responsible charge related to this service. The patient expressed understanding and agreed to proceed.    I provided 42 minutes of non-face-to-face time during this encounter.   Adah Salvage, LCSW       THERAPIST PROGRESS NOTE  Session Time: Thursday  03/27/2021  10:12 AM - 10:54 AM   Participation Level: Active  Behavioral Response: CasualAlertAnxious and Depressed  Type of Therapy: Individual Therapy  Treatment Goals addressed: Establish therapeutic alliance, learn and implement cognitive and behavioral strategies to cope with stress and anxiety so that it does not interfere with daily functioning  Interventions: CBT and Supportive  Summary: Kara Stewart is a 45 y.o. female who is referred for services by PCP Dr. Allena Katz due to pt experiencing symptoms of depression and anxiety. She denies any psychiatric hospitalizations, She reports participating in therapy through [facility] and last was seen 10 years ago.  Patient states her everyday life is up in the air and she just cannot get it together.  She reports multiple physical ailments and was diagnosed with primary biliary cirrhosis.  She reports worrying about how much longer she has to live.  Current symptoms and problems include emotional outburst, becoming overly emotional about small things, becoming irritated easily, difficulty completing task around the house, and feeling overwhelmed.  Patient also presents with a trauma history.  Patient  last was seen in the office about 3 to 4 weeks ago.  She reports no change in symptoms since last session.  She continues to experience ruminating thoughts and panic-like symptoms.  She also reports continued irritability as well as intense arousal. She reports using deep breathing as an intervention yesterday during an episode and says it was helpful.  However, she has not been practicing deep breathing regularly.  She also reports she has been using her spirituality to develop coping statements but again during this as an intervention rather than as a regular practice.  She continues to express frustration with self and expresses guilt about being irritable with family members and others and being unable to do things she used to do.    Suicidal/Homicidal: Nowithout intent/plan  Therapist Response: Reviewed symptoms, discussed stressors, facilitated expression of thoughts and feelings, validated feelings, praised and reinforced patient's use of deep breathing as an intervention, assisted patient identify effects, reviewed psychoeducation on anxiety and the stress response, reviewed rationale for practicing deep breathing regularly, developed plan with patient to practice deep breathing 5 to 10 minutes 2 times per day, also assisted patient identify other activities that have helped her relax in the past, developed plan with patient to listen to music, observe nature, and listen to ocean sounds via Internet, also developed plan with patient to read coping statements 3 times a day  Plan: Return again in 2 weeks.  Diagnosis: Axis I: PTSD, MDD        Adah Salvage, LCSW 03/27/2021

## 2021-04-06 HISTORY — PX: BREAST BIOPSY: SHX20

## 2021-04-10 ENCOUNTER — Other Ambulatory Visit: Payer: Self-pay

## 2021-04-10 ENCOUNTER — Ambulatory Visit (INDEPENDENT_AMBULATORY_CARE_PROVIDER_SITE_OTHER): Payer: Medicaid Other | Admitting: Psychiatry

## 2021-04-10 DIAGNOSIS — F321 Major depressive disorder, single episode, moderate: Secondary | ICD-10-CM

## 2021-04-10 DIAGNOSIS — F4310 Post-traumatic stress disorder, unspecified: Secondary | ICD-10-CM

## 2021-04-10 NOTE — Progress Notes (Signed)
Virtual Visit via Telephone Note  I connected with Kara Stewart on 04/10/21 at 9:07 AM EST by telephone and verified that I am speaking with the correct person using two identifiers.  Location: Patient: Home Provider: [place] office    I discussed the limitations, risks, security and privacy concerns of performing an evaluation and management service by telephone and the availability of in person appointments. I also discussed with the patient that there may be a patient responsible charge related to this service. The patient expressed understanding and agreed to proceed.   I provided 48  minutes of non-face-to-face time during this encounter.   Alonza Smoker, LCSW      THERAPIST PROGRESS NOTE  Session Time: Thursday  04/10/2020  9:07 AM - 9:55 AM   Participation Level: Active  Behavioral Response: CasualAlertAnxious and Depressed  Type of Therapy: Individual Therapy  Treatment Goals addressed: Establish therapeutic alliance, learn and implement cognitive and behavioral strategies to cope with stress and anxiety so that it does not interfere with daily functioning     Interventions: CBT and Supportive  Summary: Kara Stewart is a 46 y.o. female who is referred for services by PCP Dr. Posey Pronto due to pt experiencing symptoms of depression and anxiety. She denies any psychiatric hospitalizations, She reports participating in therapy through [facility] and last was seen 10 years ago.  Patient states her everyday life is up in the air and she just cannot get it together.  She reports multiple physical ailments and was diagnosed with primary biliary cirrhosis.  She reports worrying about how much longer she has to live.  Current symptoms and problems include emotional outburst, becoming overly emotional about small things, becoming irritated easily, difficulty completing task around the house, and feeling overwhelmed.  Patient also presents with a trauma history.  Patient last  was seen in the office about 2-3  weeks ago.  She reports significant decrease in symptoms of anxiety but increased symptoms of depression including depressed mood, negative thoughts about self, and irritability.  Per patient's report, she was doing very well in the days leading up to Christmas as she was preparing for the holiday including buying and wrapping gifts.  Since that time.  She reports feeling down.  She continues to express frustration she is unable to accomplish many task as she has in the past.  She also expresses frustration regarding chronic pain and having to take medication just to sometimes get out of bed.  Patient reports negative thoughts of being a failure.  She reports she has been practicing deep breathing and it has helped to decrease intensity and frequency of anxiety.  She reports increased awareness of feeling tension versus being relaxed.  This recognition has helped patient intervene more quickly to try to trigger a relaxation response. Suicidal/Homicidal: Nowithout intent/plan  Therapist Response: Reviewed symptoms, discussed stressors, patient reinforced patient's efforts to practice deep breathing, discussed effects, assisted patient identify triggers of increased symptoms of depression, assisted patient identify connection between thoughts/mood/behavior, assisted patient examine her thought patterns, assisted patient identify/challenge/and replace depressive thinking regarding being unable to complete task with more helpful thoughts, also assisted patient identify ways to simplify and minimize task, also assisted patient identify realistic expectations of self f, discussed self compassion   plan: Return again in 2 weeks.  Diagnosis: Axis I: PTSD, MDD        Alonza Smoker, LCSW 04/10/2021

## 2021-04-16 ENCOUNTER — Ambulatory Visit: Payer: Medicaid Other

## 2021-04-16 ENCOUNTER — Other Ambulatory Visit: Payer: Self-pay

## 2021-04-16 ENCOUNTER — Encounter: Payer: Self-pay | Admitting: Orthopedic Surgery

## 2021-04-16 ENCOUNTER — Ambulatory Visit (INDEPENDENT_AMBULATORY_CARE_PROVIDER_SITE_OTHER): Payer: Medicaid Other | Admitting: Orthopedic Surgery

## 2021-04-16 DIAGNOSIS — S322XXD Fracture of coccyx, subsequent encounter for fracture with routine healing: Secondary | ICD-10-CM | POA: Diagnosis not present

## 2021-04-16 NOTE — Progress Notes (Signed)
Orthopaedic Clinic Return  Assessment: Kara Stewart is a 46 y.o. female with the following: Minimally displaced coccyx fracture   Plan: Patient's pain is improving in her lower back.  She has some tenderness to palpation over the coccyx, but no gross motion.  Anticipate that this will continue to improve.  Worsening pain with bowel movements can be helped with stool softeners or laxatives.  This was discussed with the patient.  She continues to have issues with her neck, but has scheduled follow-up for this.  She also discussed some left knee pain, and I have advised her to continue to monitor this, as it just started within the last 1-2 days.  On physical exam, she has no swelling in the left knee so I am less concerned about an underlying meniscus injury.  All questions were answered and she is amenable to this plan.  Follow-up as needed.   Follow-up: Return if symptoms worsen or fail to improve.   Subjective:  Chief Complaint  Patient presents with   fracture care    Patient hurting more with the colder weather Fracture of coccyx/pt states it isn't a lot of pain but it "affects the way I use the bathroom"   Shoulder Pain    Pt aware may have to make another appt to be seen for this.  Pain down between the shoulder blades. Was changing positions on the couch and felt a sharp pain.     History of Present Illness: Kara Stewart is a 46 y.o. female who returns to clinic for evaluation of buttock pain.  She is well-known to my clinic, having seen me for a number of injuries including a left distal radius fracture and some neck pain.  She was last seen in my clinic approximately 4-6 weeks ago, and treated for coccyx fracture.  Her pain is improved.  She is able to sit, with more pressure on that area.  She does note some worsening pain when she has a bowel movement.  No numbness or tingling distally.   Review of Systems: No fevers or chills No numbness or tingling No chest pain No  shortness of breath No bowel or bladder dysfunction No GI distress No headaches   Objective: There were no vitals taken for this visit.  Physical Exam:  Alert and oriented.  No acute distress.  No bruising over her tailbone.  Tenderness to palpation over the coccyx, without obvious motion.  Sensation is intact distally.  She is able to maintain a straight leg raise.  No swelling within the left knee.  No tenderness to palpation along the medial or lateral joint line.  She tolerates flexion to 100 degrees.  Uses a cane to assist with ambulation.  IMAGING: I personally ordered and reviewed the following images:  X-rays of the sacrum and coccyx were obtained in clinic today, compared to previous x-rays.  The minimally displaced fracture through the coccyx is identified, and similar appearance to previous x-rays.  There has been no interval displacement.  No interval and angulation.  Fracture line remains visible.  Impression: Healing, minimally displaced coccyx fracture.   Mordecai Rasmussen, MD 04/16/2021 12:04 PM

## 2021-04-21 ENCOUNTER — Ambulatory Visit (HOSPITAL_COMMUNITY): Payer: Medicaid Other | Admitting: Psychiatry

## 2021-04-21 ENCOUNTER — Other Ambulatory Visit: Payer: Self-pay

## 2021-04-21 DIAGNOSIS — F321 Major depressive disorder, single episode, moderate: Secondary | ICD-10-CM

## 2021-04-21 DIAGNOSIS — F4310 Post-traumatic stress disorder, unspecified: Secondary | ICD-10-CM

## 2021-04-21 NOTE — Progress Notes (Signed)
Opened in error, pt en route and driving, therapist and pt agreed to reschedule.

## 2021-04-23 ENCOUNTER — Other Ambulatory Visit: Payer: Self-pay

## 2021-04-23 ENCOUNTER — Encounter: Payer: Self-pay | Admitting: Internal Medicine

## 2021-04-23 ENCOUNTER — Ambulatory Visit (INDEPENDENT_AMBULATORY_CARE_PROVIDER_SITE_OTHER): Payer: Medicaid Other | Admitting: Internal Medicine

## 2021-04-23 DIAGNOSIS — L299 Pruritus, unspecified: Secondary | ICD-10-CM

## 2021-04-23 DIAGNOSIS — K743 Primary biliary cirrhosis: Secondary | ICD-10-CM | POA: Diagnosis not present

## 2021-04-23 DIAGNOSIS — F4310 Post-traumatic stress disorder, unspecified: Secondary | ICD-10-CM

## 2021-04-23 DIAGNOSIS — G629 Polyneuropathy, unspecified: Secondary | ICD-10-CM

## 2021-04-23 DIAGNOSIS — F332 Major depressive disorder, recurrent severe without psychotic features: Secondary | ICD-10-CM | POA: Diagnosis not present

## 2021-04-23 MED ORDER — GABAPENTIN 100 MG PO CAPS: 200.0000 mg | ORAL_CAPSULE | Freq: Every day | ORAL | 2 refills | Status: DC

## 2021-04-23 MED ORDER — HYDROXYZINE PAMOATE 25 MG PO CAPS: 25.0000 mg | ORAL_CAPSULE | Freq: Three times a day (TID) | ORAL | 3 refills | Status: AC | PRN

## 2021-04-23 NOTE — Assessment & Plan Note (Signed)
Burning pain in upper back area could be from peripheral neuropathy Continue gabapentin for now, refills provided

## 2021-04-23 NOTE — Patient Instructions (Signed)
Please start taking Gabapentin as prescribed.  Please take Vistaril as needed for itching.  Please get fasting blood test done within a week.
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
## 2021-04-23 NOTE — Assessment & Plan Note (Signed)
Mild improvement with Zoloft 50 mg daily Continue to follow-up with [facility] therapist 

## 2021-04-23 NOTE — Assessment & Plan Note (Signed)
On Ursodiol Follows up with GI On vitamin supplements Recent itching, will check CMP Vistaril as needed for itching for now

## 2021-04-28 ENCOUNTER — Encounter (HOSPITAL_COMMUNITY): Payer: Self-pay

## 2021-04-28 ENCOUNTER — Other Ambulatory Visit: Payer: Self-pay

## 2021-04-28 ENCOUNTER — Ambulatory Visit (INDEPENDENT_AMBULATORY_CARE_PROVIDER_SITE_OTHER): Payer: Medicaid Other | Admitting: Psychiatry

## 2021-04-28 DIAGNOSIS — F4310 Post-traumatic stress disorder, unspecified: Secondary | ICD-10-CM | POA: Diagnosis not present

## 2021-04-28 DIAGNOSIS — F321 Major depressive disorder, single episode, moderate: Secondary | ICD-10-CM

## 2021-04-28 NOTE — Progress Notes (Signed)
Virtual Visit via Telephone Note  I connected with [name] on 04/28/21 at 11:10 AM EST  by telephone and verified that I am speaking with the correct person using two identifiers.  Location: Patient: Home Provider: [place] office    I discussed the limitations, risks, security and privacy concerns of performing an evaluation and management service by telephone and the availability of in person appointments. I also discussed with the patient that there may be a patient responsible charge related to this service. The patient expressed understanding and agreed to proceed.    I provided 40 minutes of non-face-to-face time during this encounter.   Alonza Smoker, LCSW      THERAPIST PROGRESS NOTE  Session Time: Monday  04/28/2021 11:10 AM - 11:50 AM   Participation Level: Active  Behavioral Response: CasualAlertAnxious and Depressed  Type of Therapy: Individual Therapy  Treatment Goals addressed: Establish therapeutic alliance, learn and implement cognitive and behavioral strategies to cope with stress and anxiety so that it does not interfere with daily functioning     Interventions: CBT and Supportive  Summary: Myrka Meins is a 46 y.o. female who is referred for services by PCP Dr. Posey Pronto due to pt experiencing symptoms of depression and anxiety. She denies any psychiatric hospitalizations, She reports participating in therapy through [facility] and last was seen 10 years ago.  Patient states her everyday life is up in the air and she just cannot get it together.  She reports multiple physical ailments and was diagnosed with primary biliary cirrhosis.  She reports worrying about how much longer she has to live.  Current symptoms and problems include emotional outburst, becoming overly emotional about small things, becoming irritated easily, difficulty completing task around the house, and feeling overwhelmed.  Patient also presents with a trauma history.  Patient  last was seen in the office about 2 weeks ago.  She reports continued symptoms of anxiety and depression.  Symptoms include excessive worry, depressed mood, negative thoughts about self, and irritability.  She continues to express frustration she is unable to perform tasks like she has in the past.  She is particularly distressed due to long and short-term memory difficulty.  She cites example of trying to follow a recipe and says she has to read the recipe over and over again.  She also reports difficulty with medication compliance due to memory difficulty.  She is pleased she implemented a strategy discussed in last session to try to simplify the task of washing dishes by using paper plates.  She reports this was very difficult to do but says it was helpful as it helped her to complete some tasks rather than being overwhelmed by dirty dishes.  Suicidal/Homicidal: Nowithout intent/plan  Therapist Response: Reviewed symptoms, discussed stressors, facilitated stressors, facilitated expression of thoughts and feelings, again discussed self compassion, praised and reinforced patient's efforts to try to simplify task, discussed effects, assisted patient began to identify compensatory tools for memory difficulty, developed plan with patient to use pill dispenser as well as alarm on phone to remind her to take her medication,  plan: Return again in 2 weeks.  Diagnosis: Axis I: PTSD, MDD        Alonza Smoker, LCSW 04/28/2021

## 2021-05-02 ENCOUNTER — Encounter (HOSPITAL_COMMUNITY): Payer: Self-pay

## 2021-05-02 DIAGNOSIS — L299 Pruritus, unspecified: Secondary | ICD-10-CM | POA: Diagnosis not present

## 2021-05-03 LAB — CMP14+EGFR
ALT: 14 IU/L (ref 0–32)
AST: 22 IU/L (ref 0–40)
Albumin/Globulin Ratio: 2 (ref 1.2–2.2)
Albumin: 4.5 g/dL (ref 3.8–4.8)
Alkaline Phosphatase: 82 IU/L (ref 44–121)
BUN/Creatinine Ratio: 11 (ref 9–23)
BUN: 8 mg/dL (ref 6–24)
Bilirubin Total: 0.5 mg/dL (ref 0.0–1.2)
CO2: 23 mmol/L (ref 20–29)
Calcium: 9.7 mg/dL (ref 8.7–10.2)
Chloride: 102 mmol/L (ref 96–106)
Creatinine, Ser: 0.75 mg/dL (ref 0.57–1.00)
Globulin, Total: 2.3 g/dL (ref 1.5–4.5)
Glucose: 96 mg/dL (ref 70–99)
Potassium: 4.1 mmol/L (ref 3.5–5.2)
Sodium: 140 mmol/L (ref 134–144)
Total Protein: 6.8 g/dL (ref 6.0–8.5)
eGFR: 100 mL/min/{1.73_m2} (ref >59)

## 2021-05-05 ENCOUNTER — Other Ambulatory Visit: Payer: Self-pay

## 2021-05-05 ENCOUNTER — Encounter (HOSPITAL_COMMUNITY): Payer: Self-pay

## 2021-05-05 ENCOUNTER — Ambulatory Visit (INDEPENDENT_AMBULATORY_CARE_PROVIDER_SITE_OTHER): Payer: Medicaid Other | Admitting: Psychiatry

## 2021-05-05 DIAGNOSIS — H93291 Other abnormal auditory perceptions, right ear: Secondary | ICD-10-CM | POA: Diagnosis not present

## 2021-05-05 DIAGNOSIS — F4310 Post-traumatic stress disorder, unspecified: Secondary | ICD-10-CM

## 2021-05-05 DIAGNOSIS — F321 Major depressive disorder, single episode, moderate: Secondary | ICD-10-CM | POA: Diagnosis not present

## 2021-05-05 DIAGNOSIS — H93293 Other abnormal auditory perceptions, bilateral: Secondary | ICD-10-CM | POA: Insufficient documentation

## 2021-05-05 DIAGNOSIS — K219 Gastro-esophageal reflux disease without esophagitis: Secondary | ICD-10-CM | POA: Insufficient documentation

## 2021-05-05 DIAGNOSIS — T7411XA Adult physical abuse, confirmed, initial encounter: Secondary | ICD-10-CM | POA: Insufficient documentation

## 2021-05-05 NOTE — Progress Notes (Signed)
Virtual Visit via Telephone Note  I connected with Kara Stewart on 05/05/21 at 10:12 AM EST  by telephone and verified that I am speaking with the correct person using two identifiers.  Location: Patient: Home Provider: [place] office    I discussed the limitations, risks, security and privacy concerns of performing an evaluation and management service by telephone and the availability of in person appointments. I also discussed with the patient that there may be a patient responsible charge related to this service. The patient expressed understanding and agreed to proceed.   I provided 50 minutes of non-face-to-face time during this encounter.   Kara Smoker, LCSW       THERAPIST PROGRESS NOTE  Session Time: Monday  05/05/2021 10:12 AM - 11:02 AM   Participation Level: Active  Behavioral Response: CasualAlertAnxious and Depressed  Type of Therapy: Individual Therapy  Treatment Goals addressed: Establish therapeutic alliance, learn and implement cognitive and behavioral strategies to cope with stress and anxiety so that it does not interfere with daily functioning     Interventions: CBT and Supportive  Summary: Kara Stewart is a 46 y.o. female who is referred for services by PCP Dr. Posey Stewart due to pt experiencing symptoms of depression and anxiety. She denies any psychiatric hospitalizations, She reports participating in therapy through [facility] and last was seen 10 years ago.  Patient states her everyday life is up in the air and she just cannot get it together.  She reports multiple physical ailments and was diagnosed with primary biliary cirrhosis.  She reports worrying about how much longer she has to live.  Current symptoms and problems include emotional outburst, becoming overly emotional about small things, becoming irritated easily, difficulty completing task around the house, and feeling overwhelmed.  Patient also presents with a trauma history.    Patient  last was seen in the office about a week ago.  She reports increased  symptoms of anxiety and depression.  Patient reports experiencing increased physical pain, increased depressed mood along with increased shaking and panic attacks.  Per patient's report she is experienced panic attacks 4 out of 7 days and on some days having multiple attacks.  She also reports increased sleep difficulty and says her mind is racing and she just cannot shut it off.  She still reports poor medication compliance as she forgot to get a pill dispenser.  She has been using the alarm on her phone to remind her to take medicine but then still forgets as to whether or not she already took the medication.  She reports sleeping about 3 hours per night she continues to express frustration she is unable to perform tasks like she has in the past.   Suicidal/Homicidal: Nowithout intent/plan  Therapist Response: Reviewed symptoms, discussed stressors, facilitated stressors, facilitated expression of thoughts and feelings, again discussed self compassion, praised and reinforced patient's efforts to use alarm on her phone, developed plan with patient to get a pill dispenser to help her in her efforts to improve medication compliance, also discussed referral to see psychiatrist, provided patient with list of psychiatrists she has been previously referred to, discussed rationale for and assisted patient practice a beach visualization to help manage stress and anxiety, developed plan with patient to practice nightly, will send patient audio activity using [facility] visualization via mail   plan: Return again in 2 weeks.  Diagnosis: Axis I: PTSD, MDD        Kara Smoker, LCSW 05/05/2021

## 2021-05-07 ENCOUNTER — Encounter: Payer: Self-pay | Admitting: Internal Medicine

## 2021-05-08 ENCOUNTER — Encounter: Payer: Self-pay | Admitting: Adult Health

## 2021-05-08 ENCOUNTER — Other Ambulatory Visit: Payer: Self-pay

## 2021-05-08 ENCOUNTER — Encounter: Payer: Self-pay | Admitting: Orthopedic Surgery

## 2021-05-08 ENCOUNTER — Ambulatory Visit (INDEPENDENT_AMBULATORY_CARE_PROVIDER_SITE_OTHER): Payer: Medicaid Other | Admitting: Adult Health

## 2021-05-08 VITALS — BP 111/71 | HR 79 | Ht 63.5 in | Wt 127.5 lb

## 2021-05-08 DIAGNOSIS — R61 Generalized hyperhidrosis: Secondary | ICD-10-CM | POA: Diagnosis not present

## 2021-05-08 DIAGNOSIS — N644 Mastodynia: Secondary | ICD-10-CM | POA: Diagnosis not present

## 2021-05-08 DIAGNOSIS — N951 Menopausal and female climacteric states: Secondary | ICD-10-CM | POA: Diagnosis not present

## 2021-05-08 DIAGNOSIS — N911 Secondary amenorrhea: Secondary | ICD-10-CM | POA: Diagnosis not present

## 2021-05-08 NOTE — Progress Notes (Signed)
°  Subjective:     Patient ID: Kara Stewart, female   DOB: 12/20/1975, 46 y.o.   MRN: [id number]  HPI Shantal is a 46 year old white female, divorced, [id number], in complaining of having pain in left breast, and no period in over 5 months. Lab Results  Component Value Date   DIAGPAP  02/12/2021    - Negative for intraepithelial lesion or malignancy (NILM)   HPVHIGH Negative 02/12/2021    PCP is Dr Allena Katz   Review of Systems Has pain in left breast No period in over 5 months +night sweats    Reviewed past medical,surgical, social and family history. Reviewed medications and allergies.  Objective:   Physical Exam BP 111/71 (BP Location: Left Arm, Patient Position: Sitting, Cuff Size: Normal)    Pulse 79    Ht 5' 3.5" (1.613 m)    Wt 127 lb 8 oz (57.8 kg)    BMI 22.23 kg/m      Skin warm and dry,  Breasts:no dominate palpable mass, retraction or nipple discharge, has bilateral regular irregularities, and left beast tender Fall risk is high  [facility] - 05/08/21 1126       Pregnancy Intention Screening   Does the patient want to become pregnant in the next year? No    Does the patient's partner want to become pregnant in the next year? No    Would the patient like to discuss contraceptive options today? No      Contraception Wrap Up   Current Method Female Sterilization    End Method Female Sterilization    Contraception Counseling Provided No             Assessment:     1. Breast pain, left Will get diagnostic left mammogram and [place] at [facility] 06/03/21 at 11 am   2. Amenorrhea, secondary Will check [facility] Will talk when results back  3. Night sweats Check [facility]    4. Perimenopause Review handouts on perimenopause and menopause  Plan:     Follow up prn

## 2021-05-09 LAB — FOLLICLE STIMULATING HORMONE: FSH: 11.3 m[IU]/mL

## 2021-05-14 ENCOUNTER — Telehealth (HOSPITAL_COMMUNITY): Payer: Self-pay | Admitting: Psychiatry

## 2021-05-14 ENCOUNTER — Ambulatory Visit (HOSPITAL_COMMUNITY): Payer: Medicaid Other | Admitting: Psychiatry

## 2021-05-14 NOTE — Telephone Encounter (Signed)
Called to advise appt cxd due to provider being out sick unable to leave vm

## 2021-05-16 ENCOUNTER — Encounter: Payer: Self-pay | Admitting: Internal Medicine

## 2021-05-16 ENCOUNTER — Ambulatory Visit: Payer: Medicaid Other | Admitting: Internal Medicine

## 2021-05-16 ENCOUNTER — Other Ambulatory Visit: Payer: Self-pay

## 2021-05-16 VITALS — BP 118/76 | HR 103 | Resp 18 | Ht 64.0 in | Wt 126.0 lb

## 2021-05-16 DIAGNOSIS — F332 Major depressive disorder, recurrent severe without psychotic features: Secondary | ICD-10-CM

## 2021-05-16 DIAGNOSIS — F4310 Post-traumatic stress disorder, unspecified: Secondary | ICD-10-CM

## 2021-05-16 DIAGNOSIS — G629 Polyneuropathy, unspecified: Secondary | ICD-10-CM

## 2021-05-16 DIAGNOSIS — I872 Venous insufficiency (chronic) (peripheral): Secondary | ICD-10-CM

## 2021-05-16 DIAGNOSIS — I8393 Asymptomatic varicose veins of bilateral lower extremities: Secondary | ICD-10-CM | POA: Diagnosis not present

## 2021-05-16 MED ORDER — PREGABALIN 50 MG PO CAPS: 50.0000 mg | ORAL_CAPSULE | Freq: Two times a day (BID) | ORAL | 0 refills | Status: DC

## 2021-05-16 NOTE — Assessment & Plan Note (Signed)
Has chronic numbness tingling and burning pain in back and LE Has mood symptoms with gabapentin We will switch to Lyrica Advised to follow-up with neurology
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
## 2021-05-16 NOTE — Assessment & Plan Note (Signed)
Continue to wear compression stockings Leg swelling better now Refer to vascular surgery as per patient request

## 2021-05-16 NOTE — Progress Notes (Signed)
[facility] 

## 2021-05-16 NOTE — Assessment & Plan Note (Signed)
Referred to [place] vein specialist Her leg pain is not due to varicose veins DPA pulse 2+, less likely to be vascular etiology pain

## 2021-05-16 NOTE — Patient Instructions (Signed)
Please start taking Lyrica instead of Gabapentin.  You are being referred to Vascular surgery.  You are being referred to Psychiatry.
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
## 2021-05-16 NOTE — Assessment & Plan Note (Signed)
Uncontrolled with Zoloft and BuSpar Sees [facility] therapist Referred to [facility] in [place]

## 2021-05-19 ENCOUNTER — Ambulatory Visit (HOSPITAL_COMMUNITY): Payer: Medicaid Other | Admitting: Psychiatry

## 2021-05-19 ENCOUNTER — Other Ambulatory Visit: Payer: Self-pay

## 2021-05-19 ENCOUNTER — Telehealth (HOSPITAL_COMMUNITY): Payer: Self-pay | Admitting: Psychiatry

## 2021-05-19 NOTE — Telephone Encounter (Signed)
Therapist attempted to contact patient via phone for scheduled appointment.  Therapist received voicemail message voice mailbox is full.

## 2021-05-20 ENCOUNTER — Other Ambulatory Visit: Payer: Self-pay | Admitting: Nurse Practitioner

## 2021-05-26 ENCOUNTER — Ambulatory Visit (INDEPENDENT_AMBULATORY_CARE_PROVIDER_SITE_OTHER): Payer: Medicaid Other | Admitting: Psychiatry

## 2021-05-26 ENCOUNTER — Other Ambulatory Visit: Payer: Self-pay

## 2021-05-26 DIAGNOSIS — F4310 Post-traumatic stress disorder, unspecified: Secondary | ICD-10-CM

## 2021-05-26 DIAGNOSIS — F321 Major depressive disorder, single episode, moderate: Secondary | ICD-10-CM

## 2021-05-26 NOTE — Progress Notes (Addendum)
Virtual Visit via Telephone Note  I connected with Kara Stewart on 05/26/21 at 9:10 AM EST  by telephone and verified that I am speaking with the correct person using two identifiers.  Location: Patient: Home Provider: [place] office    I discussed the limitations, risks, security and privacy concerns of performing an evaluation and management service by telephone and the availability of in person appointments. I also discussed with the patient that there may be a patient responsible charge related to this service. The patient expressed understanding and agreed to proceed.   I provided 45 minutes of non-face-to-face time during this encounter.      THERAPIST PROGRESS NOTE  Session Time: Monday 05/26/2021 9:10 AM -9:55 AM   Participation Level: Active  Behavioral Response: CasualAlertAnxious and Depressed  Type of Therapy: Individual Therapy  Treatment Goals addressed: Reduce overall frequency, intensity, and duration of anxiety so that daily functioning is not impaired AEB patient reducing panic episodes to 2 or less per month  Progress toward goals: Progressing     Interventions: CBT and Supportive  Summary: Kara Stewart is a 46 y.o. female who is referred for services by PCP Dr. Posey Pronto due to pt experiencing symptoms of depression and anxiety. She denies any psychiatric hospitalizations, She reports participating in therapy through [facility] and last was seen 10 years ago.  Patient states her everyday life is up in the air and she just cannot get it together.  She reports multiple physical ailments and was diagnosed with primary biliary cirrhosis.  She reports worrying about how much longer she has to live.  Current symptoms and problems include emotional outburst, becoming overly emotional about small things, becoming irritated easily, difficulty completing task around the house, and feeling overwhelmed.  Patient also presents with a trauma history.    Patient last  was seen  about 6-7 weeks ago.  She reports continued symptoms of anxiety and depression including excessive worry and panic attacks.  She continues to express frustration regarding her memory.  She purchased a pill dispenser but was not able to afford the expanded dispenser that covers different times of the day.  Therefore, patient still is having difficulty with medication compliance as she has been trying to fill at the same dispenser twice a day but cannot remember as to whether or not she has already taken the medication.  She has talked with her PCP who has referred her to a psychiatrist with [facility] in [place].  Patient is optimistic about upcoming appointment.  She reports not receiving information on guided meditation activity but has been trying to do the activity from memory reports this has been somewhat helpful.  Patient continues to experience stress regarding her multiple health issues.   Suicidal/Homicidal: Nowithout intent/plan  Therapist Response: Reviewed symptoms, discussed stressors, facilitated expression of thoughts and feelings, assisted patient examine her thought patterns invoking anxiety about her memory, used cognitive defusion to help patient cope with ruminating thoughts, praised and reinforced patient's efforts to use pill dispenser, developed plan with patient to purchase 2 more pill dispensers and label to use at different times of the day according to her prescriptions, praised and reinforced patient's efforts to schedule appointment with psychiatrist, encouraged patient to follow through with appointment for medication evaluation, praised and reinforced patient's efforts to use a beach visualization, checked out beach visualization audio to patient and provided patient with information regarding web address and code via phone, also will send patient a handout with this information, developed plan  with patient to practice beach visualization to manage stress and  anxiety.   plan: Return again in 2 weeks.  Diagnosis: Axis I: PTSD, MDD     Collaboration of Care: Medication management AEB encouraging patient to follow through with appointment for med management with psychiatrist at [facility] was advised Release of Information must be obtained prior to any record release in order to collaborate their care with an outside provider. Patient/Guardian was advised if they have not already done so to contact the registration department to sign all necessary forms in order for [place] to release information regarding their care.   Consent: Patient/Guardian gives verbal consent for treatment and assignment of benefits for services provided during this visit. Patient/Guardian expressed understanding and agreed to proceed.    Alonza Smoker, LCSW 05/26/2021    Alonza Smoker, LCSW

## 2021-06-02 DIAGNOSIS — K743 Primary biliary cirrhosis: Secondary | ICD-10-CM | POA: Diagnosis not present

## 2021-06-02 DIAGNOSIS — Z6821 Body mass index (BMI) 21.0-21.9, adult: Secondary | ICD-10-CM | POA: Diagnosis not present

## 2021-06-02 DIAGNOSIS — Z79899 Other long term (current) drug therapy: Secondary | ICD-10-CM | POA: Diagnosis not present

## 2021-06-02 DIAGNOSIS — R634 Abnormal weight loss: Secondary | ICD-10-CM | POA: Diagnosis not present

## 2021-06-02 DIAGNOSIS — R109 Unspecified abdominal pain: Secondary | ICD-10-CM | POA: Diagnosis not present

## 2021-06-02 DIAGNOSIS — Z9889 Other specified postprocedural states: Secondary | ICD-10-CM | POA: Diagnosis not present

## 2021-06-02 DIAGNOSIS — K769 Liver disease, unspecified: Secondary | ICD-10-CM | POA: Diagnosis not present

## 2021-06-02 DIAGNOSIS — Z8619 Personal history of other infectious and parasitic diseases: Secondary | ICD-10-CM | POA: Diagnosis not present

## 2021-06-02 DIAGNOSIS — G8929 Other chronic pain: Secondary | ICD-10-CM | POA: Diagnosis not present

## 2021-06-03 ENCOUNTER — Ambulatory Visit (HOSPITAL_COMMUNITY)
Admission: RE | Admit: 2021-06-03 | Discharge: 2021-06-03 | Disposition: A | Discharge status: Home / Self Care | Payer: Medicaid Other | Source: Ambulatory Visit | Attending: Adult Health | Admitting: Adult Health

## 2021-06-03 ENCOUNTER — Other Ambulatory Visit: Payer: Self-pay

## 2021-06-03 ENCOUNTER — Other Ambulatory Visit (HOSPITAL_COMMUNITY): Payer: Self-pay | Admitting: Adult Health

## 2021-06-03 DIAGNOSIS — N644 Mastodynia: Secondary | ICD-10-CM | POA: Insufficient documentation

## 2021-06-03 DIAGNOSIS — R922 Inconclusive mammogram: Secondary | ICD-10-CM | POA: Diagnosis not present

## 2021-06-03 DIAGNOSIS — R928 Other abnormal and inconclusive findings on diagnostic imaging of breast: Secondary | ICD-10-CM

## 2021-06-04 ENCOUNTER — Encounter: Payer: Self-pay | Admitting: *Deleted

## 2021-06-04 DIAGNOSIS — Z2821 Immunization not carried out because of patient refusal: Secondary | ICD-10-CM | POA: Insufficient documentation

## 2021-06-06 ENCOUNTER — Ambulatory Visit (INDEPENDENT_AMBULATORY_CARE_PROVIDER_SITE_OTHER): Payer: Medicaid Other | Admitting: Psychiatry

## 2021-06-06 ENCOUNTER — Other Ambulatory Visit: Payer: Self-pay

## 2021-06-06 DIAGNOSIS — F4310 Post-traumatic stress disorder, unspecified: Secondary | ICD-10-CM | POA: Diagnosis not present

## 2021-06-06 DIAGNOSIS — F321 Major depressive disorder, single episode, moderate: Secondary | ICD-10-CM | POA: Diagnosis not present

## 2021-06-06 NOTE — Progress Notes (Signed)
Virtual Visit via Telephone Note ? ?I connected with Kara Stewart on 06/06/21 at 9:03 AM EST by telephone and verified that I am speaking with the correct person using two identifiers. ? ?Location: ?Patient: Home ?Provider: [facility] [place] office  ?  ?I discussed the limitations, risks, security and privacy concerns of performing an evaluation and management service by telephone and the availability of in person appointments. I also discussed with the patient that there may be a patient responsible charge related to this service. The patient expressed understanding and agreed to proceed. ? ? ?I provided 27 minutes of non-face-to-face time during this encounter. ? ? ?Illiana Losurdo E Javian Nudd, LCSW ? ? ? ? ? ?THERAPIST PROGRESS NOTE ? ?Session Time: Friday 06/06/2021 9:03 AM -  9:30 AM  ? ?Participation Level: Active ? ?Behavioral Response: CasualAlertAnxious and Depressed ? ?Type of Therapy: Individual Therapy ? ?Treatment Goals addressed: Reduce overall frequency, intensity, and duration of anxiety so that daily functioning is not impaired AEB patient reducing panic episodes to 2 or less per month ? ?Progress toward goals: Progressing ?    ?Interventions: CBT and Supportive ? ?Summary: Aleezay Melchert is a 46 y.o. female who is referred for services by PCP Dr. Posey Pronto due to pt experiencing symptoms of depression and anxiety. She denies any psychiatric hospitalizations, She reports participating in therapy through [facility] and last was seen 10 years ago.  Patient states her everyday life is up in the air and she just cannot get it together.  She reports multiple physical ailments and was diagnosed with primary biliary cirrhosis.  She reports worrying about how much longer she has to live.  Current symptoms and problems include emotional outburst, becoming overly emotional about small things, becoming irritated easily, difficulty completing task around the house, and feeling overwhelmed.  Patient also presents with a trauma  history. ?   ?Patient last was seen  about 2-3 weeks ago.  She reports continued symptoms of anxiety and depression but increased efforts on trying to use helpful coping strategies.  Reports increased stress and the past couple of weeks as she has had several medical appointments including a liver biopsy as well as a second mammogram.  Patient reports abnormal readings on the mammogram and now having to go to an appointment next week for a breast biopsy.  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  She also reports recently being given a diagnosis of auditory processing disorder.  Patient reports being overwhelmed.  She reports she has been trying to use prayer as well as practice the beach visualization.  Per patient's report, this helps to provide momentary relaxation but she then experiences ruminating thoughts again.  She reports being unable to schedule appointment with psychiatrist due to other medical appointments but plans to schedule this in the near future.  She has not gotten extra pill dispensers but has arranged her medication in another way to try to increase medication compliance.  Per her report, she may not take medication consistently at the same time every day but is taking daily. She still plans to obtain pill dispensers.  Patient and therapist agreed to end session early today as patient's daughter has a medical appointment. ? ?Suicidal/Homicidal: Nowithout intent/plan ? ?Therapist Response: Reviewed symptoms, praised and reinforced patient's efforts to try to improve medication compliance, encouraged patient to follow through with plan to obtain pill dispenser's, praised and reinforced patient's efforts to use prayer and beach visualization, discussed effects, developed plan with patient to continue  using strategies, discussed stressors, facilitated expression of thoughts and feelings, validated feelings, discussed rationale for and assisted patient practice a mindfulness  activity using breath awareness to cope with ruminating thoughts, developed plan with patient to practice daily  ? ?plan: Return again in 2 weeks. ? ?Diagnosis: Axis I: PTSD, MDD ? ?   ?Collaboration of Care: Medication management AEB encouraging patient to schedule appointment for med management with psychiatrist at [place] ? ?Patient/Guardian was advised Release of Information must be obtained prior to any record release in order to collaborate their care with an outside provider. Patient/Guardian was advised if they have not already done so to contact the registration department to sign all necessary forms in order for [place] to release information regarding their care.  ? ?Consent: Patient/Guardian gives verbal consent for treatment and assignment of benefits for services provided during this visit. Patient/Guardian expressed understanding and agreed to proceed.  ? ? ?Mariadelaluz Guggenheim E Dayvion Sans, LCSW ?06/06/2021 ? ? ? ?Jimmey Hengel E Tannie Koskela, LCSW ? ? ? ?

## 2021-06-10 ENCOUNTER — Other Ambulatory Visit (HOSPITAL_COMMUNITY): Payer: Self-pay | Admitting: Internal Medicine

## 2021-06-10 ENCOUNTER — Ambulatory Visit (HOSPITAL_COMMUNITY)
Admission: RE | Admit: 2021-06-10 | Discharge: 2021-06-10 | Disposition: A | Discharge status: Home / Self Care | Payer: Medicaid Other | Source: Ambulatory Visit | Attending: Adult Health | Admitting: Adult Health

## 2021-06-10 ENCOUNTER — Other Ambulatory Visit: Payer: Self-pay

## 2021-06-10 ENCOUNTER — Ambulatory Visit (HOSPITAL_COMMUNITY)
Admission: RE | Admit: 2021-06-10 | Discharge: 2021-06-10 | Disposition: A | Discharge status: Home / Self Care | Payer: Medicaid Other | Source: Ambulatory Visit | Attending: Internal Medicine | Admitting: Internal Medicine

## 2021-06-10 DIAGNOSIS — R928 Other abnormal and inconclusive findings on diagnostic imaging of breast: Secondary | ICD-10-CM | POA: Insufficient documentation

## 2021-06-10 DIAGNOSIS — N6342 Unspecified lump in left breast, subareolar: Secondary | ICD-10-CM | POA: Diagnosis not present

## 2021-06-10 DIAGNOSIS — N6032 Fibrosclerosis of left breast: Secondary | ICD-10-CM | POA: Diagnosis not present

## 2021-06-10 MED ORDER — LIDOCAINE HCL (PF) 2 % IJ SOLN
INTRAMUSCULAR | Status: AC
  Filled 2021-06-10: qty 10

## 2021-06-10 MED ORDER — LIDOCAINE-EPINEPHRINE (PF) 1 %-1:200000 IJ SOLN
INTRAMUSCULAR | Status: AC
  Filled 2021-06-10: qty 30

## 2021-06-11 ENCOUNTER — Telehealth: Payer: Self-pay | Admitting: Adult Health

## 2021-06-11 LAB — SURGICAL PATHOLOGY

## 2021-06-11 NOTE — Telephone Encounter (Signed)
Patient calling stating that she is having some pain still from the biopsy from the other day and she is taking the max mg of tylenol and and ice packs on it and nothing is helping. She is wanting to know what else to do. Asking for someone to call her ?

## 2021-06-11 NOTE — Telephone Encounter (Signed)
Returned pt's call, no answer, vm full. Sent Mychart msg. ?

## 2021-06-13 ENCOUNTER — Other Ambulatory Visit: Payer: Self-pay | Admitting: Gastroenterology

## 2021-06-16 NOTE — Telephone Encounter (Signed)
Last office visit 01/07/21

## 2021-06-17 DIAGNOSIS — H209 Unspecified iridocyclitis: Secondary | ICD-10-CM | POA: Diagnosis not present

## 2021-06-17 DIAGNOSIS — H3581 Retinal edema: Secondary | ICD-10-CM | POA: Insufficient documentation

## 2021-06-17 DIAGNOSIS — H5213 Myopia, bilateral: Secondary | ICD-10-CM | POA: Insufficient documentation

## 2021-06-18 ENCOUNTER — Inpatient Hospital Stay (HOSPITAL_COMMUNITY): Payer: Medicaid Other

## 2021-06-18 ENCOUNTER — Ambulatory Visit: Payer: Medicaid Other | Admitting: Adult Health

## 2021-06-19 ENCOUNTER — Inpatient Hospital Stay (HOSPITAL_COMMUNITY): Payer: Medicaid Other | Attending: Hematology

## 2021-06-19 DIAGNOSIS — I81 Portal vein thrombosis: Secondary | ICD-10-CM | POA: Diagnosis not present

## 2021-06-19 DIAGNOSIS — I8391 Asymptomatic varicose veins of right lower extremity: Secondary | ICD-10-CM | POA: Insufficient documentation

## 2021-06-19 DIAGNOSIS — K743 Primary biliary cirrhosis: Secondary | ICD-10-CM | POA: Insufficient documentation

## 2021-06-19 DIAGNOSIS — Z7901 Long term (current) use of anticoagulants: Secondary | ICD-10-CM | POA: Insufficient documentation

## 2021-06-19 DIAGNOSIS — D75839 Thrombocytosis, unspecified: Secondary | ICD-10-CM | POA: Insufficient documentation

## 2021-06-19 DIAGNOSIS — Z9081 Acquired absence of spleen: Secondary | ICD-10-CM | POA: Diagnosis not present

## 2021-06-19 DIAGNOSIS — E538 Deficiency of other specified B group vitamins: Secondary | ICD-10-CM | POA: Diagnosis not present

## 2021-06-19 DIAGNOSIS — D509 Iron deficiency anemia, unspecified: Secondary | ICD-10-CM | POA: Insufficient documentation

## 2021-06-19 DIAGNOSIS — R5383 Other fatigue: Secondary | ICD-10-CM | POA: Diagnosis not present

## 2021-06-19 DIAGNOSIS — D72829 Elevated white blood cell count, unspecified: Secondary | ICD-10-CM | POA: Insufficient documentation

## 2021-06-19 DIAGNOSIS — E559 Vitamin D deficiency, unspecified: Secondary | ICD-10-CM | POA: Diagnosis not present

## 2021-06-19 LAB — CBC WITH DIFFERENTIAL/PLATELET
Abs Immature Granulocytes: 0.02 10*3/uL (ref 0.00–0.07)
Basophils Absolute: 0.1 10*3/uL (ref 0.0–0.1)
Basophils Relative: 1 %
Eosinophils Absolute: 0.4 10*3/uL (ref 0.0–0.5)
Eosinophils Relative: 4 %
HCT: 44 % (ref 36.0–46.0)
Hemoglobin: 14.4 g/dL (ref 12.0–15.0)
Immature Granulocytes: 0 %
Lymphocytes Relative: 42 %
Lymphs Abs: 3.9 10*3/uL (ref 0.7–4.0)
MCH: 31.5 pg (ref 26.0–34.0)
MCHC: 32.7 g/dL (ref 30.0–36.0)
MCV: 96.3 fL (ref 80.0–100.0)
Monocytes Absolute: 0.5 10*3/uL (ref 0.1–1.0)
Monocytes Relative: 6 %
Neutro Abs: 4.4 10*3/uL (ref 1.7–7.7)
Neutrophils Relative %: 47 %
Platelets: 333 10*3/uL (ref 150–400)
RBC: 4.57 MIL/uL (ref 3.87–5.11)
RDW: 12.4 % (ref 11.5–15.5)
WBC: 9.3 10*3/uL (ref 4.0–10.5)
nRBC: 0 % (ref 0.0–0.2)

## 2021-06-19 LAB — FERRITIN: Ferritin: 120 ng/mL (ref 11–307)

## 2021-06-19 LAB — IRON AND TIBC
Iron: 144 ug/dL (ref 28–170)
Saturation Ratios: 42 % — ABNORMAL HIGH (ref 10.4–31.8)
TIBC: 340 ug/dL (ref 250–450)
UIBC: 196 ug/dL

## 2021-06-19 LAB — FOLATE: Folate: 13.1 ng/mL (ref >5.9)

## 2021-06-19 LAB — VITAMIN B12: Vitamin B-12: 780 pg/mL (ref 180–914)

## 2021-06-19 LAB — VITAMIN D 25 HYDROXY (VIT D DEFICIENCY, FRACTURES): Vit D, 25-Hydroxy: 27.29 ng/mL — ABNORMAL LOW (ref 30–100)

## 2021-06-20 ENCOUNTER — Other Ambulatory Visit: Payer: Self-pay

## 2021-06-20 ENCOUNTER — Ambulatory Visit (INDEPENDENT_AMBULATORY_CARE_PROVIDER_SITE_OTHER): Payer: Medicaid Other | Admitting: Psychiatry

## 2021-06-20 DIAGNOSIS — F4310 Post-traumatic stress disorder, unspecified: Secondary | ICD-10-CM

## 2021-06-20 NOTE — Progress Notes (Signed)
? ?Virtual Visit via Telephone Note ?[facility] ? ?I connected with Kara Stewart  on 06/23/21 at  9:06 AM by telephone and verified that I am speaking with the correct person using two identifiers. ? ?Location: ?Patient: Home ?Provider: [facility] ?  ?I discussed the limitations, risks, security and privacy concerns of performing an evaluation and management service by telephone and the availability of in person appointments. I also discussed with the patient that there may be a patient responsible charge related to this service. The patient expressed understanding and agreed to proceed. ? ? 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  ?CURRENT THERAPY: Lifelong Eliquis, intermittent IV iron infusions (last Feraheme on 12/17/2020) ? ?INTERVAL HISTORY:  ?Kara Stewart 46 y.o. 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  She was last evaluated via telemedicine visit by Rojelio Brenner PA-C on 03/20/2021.   ? ?At today's visit, she reports feeling poorly secondary to her multiple chronic illnesses.  She reports that her GI symptoms have been worse than usual, with increased nausea, vomiting, and poor appetite.  Her gastroenterologist are planning on upcoming endoscopy and liver biopsy. ? ?She continues to have significant chronic fatigue. ?She is faithful with taking her Eliquis for portal vein thrombosis. ?She has not noticed any epistaxis, hematemesis, hematochezia, or melena.  She had previously complained of extremely heavy periods, but reports that she has not had a menstrual cycle since October 2022 and is thought to be in perimenopause. ?She continues to have chronic headaches, dyspnea on exertion, lightheadedness, and syncope. ?She denies any pica, restless legs, chest pain, or  syncope. ?She has been taking her multivitamin/folic acid, B12 injections, and vitamin D 2000 units as instructed. ?  ?She reports little to no energy and poor appetite.  She reports that her weight has been stable. ? ?  ?OBSERVATIONS/OBJECTIVE: ?Review of Systems  ?Constitutional:  Positive for malaise/fatigue. Negative for chills, diaphoresis, fever and weight loss.  ?Respiratory:  Positive for cough and shortness of breath (with exertion).   ?Cardiovascular:  Negative for chest pain and palpitations.  ?Gastrointestinal:  Positive for nausea and vomiting. Negative for abdominal pain, blood in stool and melena.  ?Musculoskeletal:  Positive for back pain and joint pain.  ?Neurological:  Positive for dizziness, tingling (hands and feet) and headaches.  ?Psychiatric/Behavioral:  Positive for depression. The patient is nervous/anxious and has insomnia (difficulty staying asleep).    ? ?PHYSICAL EXAM (per limitations of virtual telephone visit): The patient is alert and oriented x 3, exhibiting adequate mentation, good mood, and ability to speak in full sentences and execute sound judgement. ? ? ?ASSESSMENT & PLAN: ?1.  Portal vein thrombosis ?- November 2021, patient fell and was found to have splenic rupture with thrombus in the main portal vein and central intrahepatic vein and branches, with edema around the pancreas, as well as fatty liver with early cirrhosis.  She had splenectomy as well as liver biopsy that showed steatohepatitis.  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 ?- Hypercoagulable work-up was negative ?- She is stable on Eliquis, reports that she has not missed any doses since her last appointment ?- Denies easy bruising.  No major blood loss.   ?- PLAN: Continue Eliquis indefinitely. ?- Patient instructed that if she has upcoming procedures (EGD and liver biopsy), Eliquis can be held for 48 hours  prior to procedure and resumed the following day, per surgeon  discretion.  She is instructed to call our office for assistance with this once procedure has been scheduled. ?  ?2.  Iron deficiency anemia ?- Etiology of iron deficiency anemia suspected to be blood loss related to menorrhagia, but patient has been perimenopausal with LMP in October 2022.  Iron levels have been more stable since that time. ?- EGD (11/21/2015): Mallory-Weiss tear, likely cause of hematemesis ?- Colonoscopy (02/05/2015): Mild diverticulosis and small internal hemorrhoids ?- She was unable to tolerate oral iron tablets.  Most recent Feraheme 12/17/2020, with mild improvement in fatigue. ?- She previously had heavy blood loss secondary to menorrhagia, but has not had a menstrual cycle for the past several months and wonders if she is approaching menopause.  She denies any signs of GI blood loss.   ?- Her significant fatigue is multifactorial.   ?- Most recent labs (06/19/2021): Hgb 14.4, normal CBC; ferritin 120, iron saturation 42 % ?- PLAN: No indication for IV iron at this time.  Repeat labs and RTC in 4 months. ?- Continue to recommend that patient speak with her GYN regarding treatment options for menorrhagia versus management of possible perimenopausal symptoms. ?  ?3.  Leukocytosis and thrombocytosis, intermittent ?- Most recent labs (06/19/2021) shows normal CBC without leukocytosis or thrombocytosis ?-Intermittent leukocytosis and thrombocytosis are likely secondary to splenectomy ?- PLAN: Repeat CBC in 4 months.  No indication for further work-up (such as JAK2, flow cytometry, BCR/ABL) unless there is significant deviation from baseline. ?  ?4. Right leg varicose veins ?- Symptomatic varicose veins of right thigh    ?- Patient was concerned about blood clot, but venous duplex on 08/15/2020 was negative for right lower extremity DVT ?- PLAN: Referral has been sent to vascular surgery for further work-up and treatment. ?  ?5.  Folate deficiency: ?- Folate normal 13.1 (06/19/2021) ?- PLAN: Continue  daily multivitamin with folic acid.  Recheck folic acid in 6 months (September 2023) ?  ?6.  B12 deficiency: ?- B12 normal 780, normal methylmalonic acid (06/19/2021)  ?- PLAN: Continue monthly B12 injections at home.    Repeat B12 and methylmalonic acid in 6 months (September 2023) ? ?7.  Vitamin D deficiency  ?- Vitamin D remains low 27.29 (06/19/2021) ?- She is taking vitamin D 2000 units daily  ?- PLAN: Increase vitamin D to 5000 units daily.  Recheck vitamin D in 6 months. ?  ?8.    Primary biliary cirrhosis and chronic abdominal pain: ?- Chronic and multifactorial secondary to primary biliary cirrhosis and splenectomy with postoperative changes (small irregular collection of left upper quadrant hematoma or seroma) ?- Follows with gastroenterology, and is on ursodiol ?- PLAN:  Continue follow-up with gastroenterology.   ? ? ?  ?I discussed the assessment and treatment plan with the patient. The patient was provided an opportunity to ask questions and all were answered. The patient agreed with the plan and demonstrated an understanding of the instructions. ?  ?The patient was advised to call back or seek an in-person evaluation if the symptoms worsen or if the condition fails to improve as anticipated. ? ?I provided 21 minutes of non-face-to-face time during this encounter. ? ? ?Carnella Guadalajara, PA-C ?06/23/21 9:45 AM  ?

## 2021-06-20 NOTE — Progress Notes (Signed)
Virtual Visit via Telephone Note ? ?I connected with Kara Stewart on 06/20/21 at 9:10 AM EDT  by telephone and verified that I am speaking with the correct person using two identifiers. ? ?Location: ?Patient: Car ?Provider: [facility] [place] office  ?  ?I discussed the limitations, risks, security and privacy concerns of performing an evaluation and management service by telephone and the availability of in person appointments. I also discussed with the patient that there may be a patient responsible charge related to this service. The patient expressed understanding and agreed to proceed. ? ? ?I provided 50 ? minutes of non-face-to-face time during this encounter. ? ? ?Kara Stewart E Kara Folz, LCSW ? ? ? ? ? ?THERAPIST PROGRESS NOTE ? ?Session Time: Friday 06/20/2021 9:10 AM - 10:00 AM  ? ?Participation Level: Active ? ?Behavioral Response: CasualAlertAnxious and Depressed ? ?Type of Therapy: Individual Therapy ? ?Treatment Goals addressed: Reduce overall frequency, intensity, and duration of anxiety so that daily functioning is not impaired AEB patient reducing panic episodes to 2 or less per month ? ?Progress toward goals: Progressing ?    ?Interventions: CBT and Supportive           ? ?Summary: Kara Stewart is a 46 y.o. female who is referred for services by PCP Dr. Posey Pronto due to pt experiencing symptoms of depression and anxiety. She denies any psychiatric hospitalizations, She reports participating in therapy through [facility] and last was seen 10 years ago.  Patient states her everyday life is up in the air and she just cannot get it together.  She reports multiple physical ailments and was diagnosed with primary biliary cirrhosis.  She reports worrying about how much longer she has to live.  Current symptoms and problems include emotional outburst, becoming overly emotional about small things, becoming irritated easily, difficulty completing task around the house, and feeling overwhelmed.  Patient also presents  with a trauma history. ?   ?Patient last was seen  about 2-3 weeks ago.  She reports continued symptoms of anxiety and depression but continued efforts on trying to use helpful coping strategies.  She has been using deep breathing, mindfulness activity using breath awareness, and beach visualization as coping techniques.  She reports having increased awareness of her thoughts, feelings, and body sensations.  This has been particularly helpful in patient intervening when she becomes aware she is hyperventilating per her report.  She reports having panic like episodes 2-3 times per week.  Patient also reports she has resumed attending church and reports this has been very helpful.  She continues to use prayer and spirituality as coping tools as well.  Patient continues to have multiple health issues but is pleased recent breast biopsy was benign.  She now is waiting for a liver biopsy.  She reports additional stress regarding relationship with boyfriend and is considering ending the relationship.  She expresses ambivalent feelings regarding this.  Patient also reports financial stress and is considering moving back in with her parents.  Patient reports she is scheduled to see psychiatrist on 07/03/2021 for medication evaluation.   ?Suicidal/Homicidal: Nowithout intent/plan ? ?Therapist Response: Reviewed symptoms, administered GAD-7, praised and reinforced patient's increased awareness/her use of helpful coping strategies, discussed effects, praised and reinforced patient's use of her spirituality, discussed stressors, facilitated expression of thoughts and feelings, validated feelings, discussed possibility of patient developing a pros and cons list regarding making a decision about her relationship , encouraged  patient to continue practicing a relaxation technique daily ?plan: Return again in 2  weeks. ? ?Diagnosis: Axis I: PTSD, MDD ? ?   ?Collaboration of Care: Medication management AEB encouraged patient to follow  through with  appointment for med eval/management with psychiatrist at [place] ? ?Patient/Guardian was advised Release of Information must be obtained prior to any record release in order to collaborate their care with an outside provider. Patient/Guardian was advised if they have not already done so to contact the registration department to sign all necessary forms in order for [place] to release information regarding their care.  ? ?Consent: Patient/Guardian gives verbal consent for treatment and assignment of benefits for services provided during this visit. Patient/Guardian expressed understanding and agreed to proceed.  ? ? ?Kara Stewart E Kara Wertheim, LCSW ?06/20/2021 ? ? ? ?Kara Stewart E Dacari Beckstrand, LCSW ? ? ? ? ?

## 2021-06-22 LAB — METHYLMALONIC ACID, SERUM: Methylmalonic Acid, Quantitative: 123 nmol/L (ref 0–378)

## 2021-06-23 ENCOUNTER — Inpatient Hospital Stay (HOSPITAL_BASED_OUTPATIENT_CLINIC_OR_DEPARTMENT_OTHER): Payer: Medicaid Other | Admitting: Physician Assistant

## 2021-06-23 ENCOUNTER — Other Ambulatory Visit: Payer: Self-pay

## 2021-06-23 DIAGNOSIS — E538 Deficiency of other specified B group vitamins: Secondary | ICD-10-CM

## 2021-06-23 DIAGNOSIS — I81 Portal vein thrombosis: Secondary | ICD-10-CM

## 2021-06-23 DIAGNOSIS — E559 Vitamin D deficiency, unspecified: Secondary | ICD-10-CM | POA: Diagnosis not present

## 2021-06-23 DIAGNOSIS — D509 Iron deficiency anemia, unspecified: Secondary | ICD-10-CM | POA: Diagnosis not present

## 2021-06-23 MED ORDER — VITAMIN D3 125 MCG (5000 UT) PO CAPS: 1.0000 | ORAL_CAPSULE | Freq: Every day | ORAL | 3 refills | Status: AC

## 2021-06-24 ENCOUNTER — Encounter: Payer: Self-pay | Admitting: Internal Medicine

## 2021-06-26 ENCOUNTER — Ambulatory Visit (INDEPENDENT_AMBULATORY_CARE_PROVIDER_SITE_OTHER): Payer: Medicaid Other | Admitting: Rheumatology

## 2021-06-26 ENCOUNTER — Encounter: Payer: Self-pay | Admitting: Rheumatology

## 2021-06-26 ENCOUNTER — Ambulatory Visit (INDEPENDENT_AMBULATORY_CARE_PROVIDER_SITE_OTHER): Payer: Medicaid Other

## 2021-06-26 ENCOUNTER — Other Ambulatory Visit: Payer: Self-pay

## 2021-06-26 VITALS — BP 100/64 | HR 72 | Ht 64.0 in | Wt 123.4 lb

## 2021-06-26 DIAGNOSIS — E559 Vitamin D deficiency, unspecified: Secondary | ICD-10-CM | POA: Diagnosis not present

## 2021-06-26 DIAGNOSIS — M542 Cervicalgia: Secondary | ICD-10-CM | POA: Diagnosis not present

## 2021-06-26 DIAGNOSIS — M545 Low back pain, unspecified: Secondary | ICD-10-CM

## 2021-06-26 DIAGNOSIS — Z8669 Personal history of other diseases of the nervous system and sense organs: Secondary | ICD-10-CM

## 2021-06-26 DIAGNOSIS — M797 Fibromyalgia: Secondary | ICD-10-CM | POA: Diagnosis not present

## 2021-06-26 DIAGNOSIS — M19072 Primary osteoarthritis, left ankle and foot: Secondary | ICD-10-CM | POA: Diagnosis not present

## 2021-06-26 DIAGNOSIS — M2550 Pain in unspecified joint: Secondary | ICD-10-CM | POA: Diagnosis not present

## 2021-06-26 DIAGNOSIS — M19071 Primary osteoarthritis, right ankle and foot: Secondary | ICD-10-CM

## 2021-06-26 DIAGNOSIS — K743 Primary biliary cirrhosis: Secondary | ICD-10-CM

## 2021-06-26 DIAGNOSIS — Z8261 Family history of arthritis: Secondary | ICD-10-CM

## 2021-06-26 DIAGNOSIS — M533 Sacrococcygeal disorders, not elsewhere classified: Secondary | ICD-10-CM

## 2021-06-26 DIAGNOSIS — E041 Nontoxic single thyroid nodule: Secondary | ICD-10-CM

## 2021-06-26 DIAGNOSIS — M19041 Primary osteoarthritis, right hand: Secondary | ICD-10-CM

## 2021-06-26 DIAGNOSIS — H209 Unspecified iridocyclitis: Secondary | ICD-10-CM | POA: Diagnosis not present

## 2021-06-26 DIAGNOSIS — M19042 Primary osteoarthritis, left hand: Secondary | ICD-10-CM

## 2021-06-26 DIAGNOSIS — G4709 Other insomnia: Secondary | ICD-10-CM | POA: Diagnosis not present

## 2021-06-26 DIAGNOSIS — R5383 Other fatigue: Secondary | ICD-10-CM

## 2021-06-26 DIAGNOSIS — F1291 Cannabis use, unspecified, in remission: Secondary | ICD-10-CM

## 2021-06-26 DIAGNOSIS — Z8719 Personal history of other diseases of the digestive system: Secondary | ICD-10-CM

## 2021-06-26 DIAGNOSIS — I81 Portal vein thrombosis: Secondary | ICD-10-CM

## 2021-06-26 DIAGNOSIS — M2241 Chondromalacia patellae, right knee: Secondary | ICD-10-CM | POA: Diagnosis not present

## 2021-06-26 DIAGNOSIS — Z8659 Personal history of other mental and behavioral disorders: Secondary | ICD-10-CM

## 2021-06-26 DIAGNOSIS — Z9081 Acquired absence of spleen: Secondary | ICD-10-CM

## 2021-06-26 DIAGNOSIS — Z87898 Personal history of other specified conditions: Secondary | ICD-10-CM

## 2021-06-26 DIAGNOSIS — I471 Supraventricular tachycardia: Secondary | ICD-10-CM

## 2021-06-26 DIAGNOSIS — G8929 Other chronic pain: Secondary | ICD-10-CM

## 2021-06-26 DIAGNOSIS — M2242 Chondromalacia patellae, left knee: Secondary | ICD-10-CM

## 2021-06-26 DIAGNOSIS — R296 Repeated falls: Secondary | ICD-10-CM
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
## 2021-06-27 ENCOUNTER — Other Ambulatory Visit: Payer: Self-pay | Admitting: Internal Medicine

## 2021-06-27 DIAGNOSIS — F4310 Post-traumatic stress disorder, unspecified: Secondary | ICD-10-CM

## 2021-06-30 LAB — SEDIMENTATION RATE: Sed Rate: 9 mm/h (ref 0–20)

## 2021-06-30 LAB — C3 AND C4
C3 Complement: 124 mg/dL (ref 83–193)
C4 Complement: 23 mg/dL (ref 15–57)

## 2021-06-30 LAB — ANTI-SMITH ANTIBODY: ENA SM Ab Ser-aCnc: <1 AI

## 2021-06-30 LAB — RHEUMATOID FACTOR: Rheumatoid fact SerPl-aCnc: <14 IU/mL (ref <14)

## 2021-06-30 LAB — SJOGRENS SYNDROME-A EXTRACTABLE NUCLEAR ANTIBODY: SSA (Ro) (ENA) Antibody, IgG: <1 AI

## 2021-06-30 LAB — RNP ANTIBODY: Ribonucleic Protein(ENA) Antibody, IgG: <1 AI

## 2021-06-30 LAB — SJOGRENS SYNDROME-B EXTRACTABLE NUCLEAR ANTIBODY: SSB (La) (ENA) Antibody, IgG: <1 AI

## 2021-06-30 LAB — ANTI-DNA ANTIBODY, DOUBLE-STRANDED: ds DNA Ab: <1 IU/mL

## 2021-07-01 DIAGNOSIS — F322 Major depressive disorder, single episode, severe without psychotic features: Secondary | ICD-10-CM | POA: Diagnosis not present

## 2021-07-01 DIAGNOSIS — F411 Generalized anxiety disorder: Secondary | ICD-10-CM | POA: Diagnosis not present

## 2021-07-01 DIAGNOSIS — F4312 Post-traumatic stress disorder, chronic: Secondary | ICD-10-CM | POA: Diagnosis not present

## 2021-07-03 ENCOUNTER — Ambulatory Visit (INDEPENDENT_AMBULATORY_CARE_PROVIDER_SITE_OTHER): Payer: Medicaid Other | Admitting: Psychiatry

## 2021-07-03 DIAGNOSIS — F321 Major depressive disorder, single episode, moderate: Secondary | ICD-10-CM | POA: Diagnosis not present

## 2021-07-03 DIAGNOSIS — F4310 Post-traumatic stress disorder, unspecified: Secondary | ICD-10-CM | POA: Diagnosis not present

## 2021-07-03 NOTE — Progress Notes (Signed)
Virtual Visit via Telephone Note ? ?I connected with [name] on 07/03/21 at  9:00 AM EDT by telephone and verified that I am speaking with the correct person using two identifiers. ? ?Location: ?Patient: Car ?Provider: [facility] [place] office  ?  ?I discussed the limitations, risks, security and privacy concerns of performing an evaluation and management service by telephone and the availability of in person appointments. I also discussed with the patient that there may be a patient responsible charge related to this service. The patient expressed understanding and agreed to proceed. ? ? ?I provided 55 minutes of non-face-to-face time during this encounter. ? ? ?Emmakate Hypes E Lavaughn Haberle, LCSW ? ? ? ? ? ? ?THERAPIST PROGRESS NOTE ? ?Session Time: Thursday 07/03/2021 9:00 AM  - 9:55 AM  ? ?Participation Level: Active ? ?Behavioral Response: CasualAlertAnxious and Depressed ? ?Type of Therapy: Individual Therapy ? ?Treatment Goals addressed: Reduce overall frequency, intensity, and duration of anxiety so that daily functioning is not impaired AEB patient reducing panic episodes to 2 or less per month ? ?Progress toward goals: Progressing ?    ?Interventions: CBT and Supportive           ? ?Summary: Murielle Stang is a 46 y.o. female who is referred for services by PCP Dr. Posey Pronto due to pt experiencing symptoms of depression and anxiety. She denies any psychiatric hospitalizations, She reports participating in therapy through [facility] and last was seen 10 years ago.  Patient states her everyday life is up in the air and she just cannot get it together.  She reports multiple physical ailments and was diagnosed with primary biliary cirrhosis.  She reports worrying about how much longer she has to live.  Current symptoms and problems include emotional outburst, becoming overly emotional about small things, becoming irritated easily, difficulty completing task around the house, and feeling overwhelmed.  Patient also presents  with a trauma history. ?   ?Patient last was seen  about 2-3 weeks ago.  She reports continued symptoms of anxiety and depression including having panic attacks about 4 times per week.  She reports continued efforts on trying to use helpful coping strategies including her spirituality, deep breathing, and beach visualization.  She reports continued increased awareness of her thoughts, feelings, and body sensations.  She reports increased stress due to multiple issues since last session.  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  She has seen a specialist at [facility] and now is on a 21-day regiment of high dosage of steroids.  She also just started taking an immunosuppressant drug that she will need to take for several years.  She worries about possible side effects including cancer.  She also continues to worry about financial issues as well as the relationship with her boyfriend.  However, patient is pleased she met with psychiatrist and medication was increased.  But, she expresses frustration and worry as she wants to see results quickly.  She continues to report frequent rumination of the past and future.  However, she expresses increased hope about the future.   ? ?Suicidal/Homicidal: Nowithout intent/plan ? ?Therapist Response: Reviewed symptoms, administered GAD-7, praised and reinforced patient's increased awareness/her use of helpful coping strategies, discussed effects, praised and reinforced patient's use of her spirituality, discussed stressors, facilitated expression of thoughts and feelings, validated feelings, praised and reinforced patient following through with appointment with psychiatrist, assisted patient identify realistic expectations of medication management process,  reviewed treatment plan, obtained pt's permission to electronically sign plan for pt as this was a virtual visit, developed plan with patient to  use pill dispenser to help organize medication and maintain medication compliance, assisted patient identify ways to use her spirituality to develop coping statements, reviewed rationale for and develop plan with patient to use mindfulness activity (leaves on the stream) to cope with ruminating thoughts, will send patient info via mail   ? ?plan: Return again in 1 week ? ?Diagnosis: Axis I: PTSD, MDD ? ?   ?Collaboration of Care: Medication management AEB encouraged patient to follow through with  appointment for med eval/management with psychiatrist at [facility] ? ?Patient/Guardian was advised Release of Information must be obtained prior to any record release in order to collaborate their care with an outside provider. Patient/Guardian was advised if they have not already done so to contact the registration department to sign all necessary forms in order for [place] to release information regarding their care.  ? ?Consent: Patient/Guardian gives verbal consent for treatment and assignment of benefits for services provided during this visit. Patient/Guardian expressed understanding and agreed to proceed.  ? ? ?Mohini Heathcock E Lanora Reveron, LCSW ?07/03/2021 ? ? ? ?Tasheema Perrone E Caleyah Jr, LCSW ? ? ? ? ?

## 2021-07-03 NOTE — Plan of Care (Signed)
Pt participated in development of plan ?Problem: Anxiety Disorder CCP Problem  1 panic attacks ( shaking, face red, hyperventilating) ?Goal: Reduce overall frequency, intensity, and duration of anxiety so that daily functioning is not impaired AEB patient reducing panic episodes to 2 or less per month ?Outcome: Progressing ?Goal: STG: Report a decrease in anxiety symptoms as evidenced by an overall reduction in anxiety score by a minimum of 25% on the Generalized Anxiety Disorder Scale ?Outcome: Progressing ?  ?Problem: Anxiety Disorder CCP Problem  1 panic attacks ( shaking, face red, hyperventilating) ?Intervention: Provide education and instruction on relaxation techniques,  patient will learn and implement 3 relaxation techniques, practice a relaxation technique daily ?Note: continue ?Intervention: Review results of GAD-7 with the patient to track progress ?Note: continue ?Intervention: Work with patient to track symptoms, triggers and/or skill use through a mood chart, diary card, or journal ?Note: continue ?Intervention: Work with patient to identify the major components of a recent episode of anxiety: physical symptoms, major thoughts and images, and major behaviors they experienced ?Note: continue ?  ?

## 2021-07-09 ENCOUNTER — Ambulatory Visit (HOSPITAL_COMMUNITY): Payer: Medicaid Other | Admitting: Psychiatry

## 2021-07-17 DIAGNOSIS — K769 Liver disease, unspecified: Secondary | ICD-10-CM | POA: Diagnosis not present

## 2021-07-23 ENCOUNTER — Ambulatory Visit (HOSPITAL_COMMUNITY): Payer: Medicaid Other | Admitting: Psychiatry

## 2021-07-23 ENCOUNTER — Telehealth (HOSPITAL_COMMUNITY): Payer: Self-pay | Admitting: Psychiatry

## 2021-07-23 NOTE — Telephone Encounter (Signed)
Therapist attempted to contact patient via text through caregility platform for scheduled appointment, no response.  Therapist called patient and received voicemail recording.  Therapist was unable to leave message as mailbox was full. ?

## 2021-07-29 ENCOUNTER — Ambulatory Visit (INDEPENDENT_AMBULATORY_CARE_PROVIDER_SITE_OTHER): Payer: Medicaid Other | Admitting: Psychiatry

## 2021-07-29 DIAGNOSIS — F322 Major depressive disorder, single episode, severe without psychotic features: Secondary | ICD-10-CM | POA: Diagnosis not present

## 2021-07-29 DIAGNOSIS — F321 Major depressive disorder, single episode, moderate: Secondary | ICD-10-CM

## 2021-07-29 DIAGNOSIS — F411 Generalized anxiety disorder: Secondary | ICD-10-CM | POA: Diagnosis not present

## 2021-07-29 DIAGNOSIS — F4310 Post-traumatic stress disorder, unspecified: Secondary | ICD-10-CM

## 2021-07-29 DIAGNOSIS — F4312 Post-traumatic stress disorder, chronic: Secondary | ICD-10-CM | POA: Diagnosis not present

## 2021-07-29 NOTE — Progress Notes (Signed)
Virtual Visit via Telephone Note ? ?I connected with Kara Stewart on 07/29/21 at 9:03 AM EDT  by telephone and verified that I am speaking with the correct person using two identifiers. ? ?Location: ?Patient: Kara Stewart  ?Provider: [facility] [place] Office  ?  ?I discussed the limitations, risks, security and privacy concerns of performing an evaluation and management service by telephone and the availability of in person appointments. I also discussed with the patient that there may be a patient responsible charge related to this service. The patient expressed understanding and agreed to proceed. ? ? ? ?I provided 38 minutes of non-face-to-face time during this encounter. ? ? ?Rilea Arutyunyan E Tennile Styles, LCSW ? ? ? ? ? ? ? ?THERAPIST PROGRESS NOTE ? ?Session Time: Tuesday 07/29/2021 9:05 AM - 9:43 AM  ? ?Participation Level: Active ? ?Behavioral Response: CasualAlertAnxious and Depressed ? ?Type of Therapy: Individual Therapy ? ?Treatment Goals addressed: Reduce overall frequency, intensity, and duration of anxiety so that daily functioning is not impaired AEB patient reducing panic episodes to 2 or less per month ? ?Progress toward goals: Progressing ?    ?Interventions: CBT and Supportive           ? ?Summary: Kara Stewart is a 46 y.o. female who is referred for services by PCP Dr. Allena Katz due to pt experiencing symptoms of depression and anxiety. She denies any psychiatric hospitalizations, She reports participating in therapy through [facility] and last was seen 10 years ago.  Patient states her everyday life is up in the air and she just cannot get it together.  She reports multiple physical ailments and was diagnosed with primary biliary cirrhosis.  She reports worrying about how much longer she has to live.  Current symptoms and problems include emotional outburst, becoming overly emotional about small things, becoming irritated easily, difficulty completing task around the house, and feeling overwhelmed.  Patient also  presents with a trauma history. ?   ?Patient last was seen  about 3-4  weeks ago.  She reports continued symptoms of anxiety and depression but better able to manage. She continues to report feeling overwhelmed at times.  However, she has become more aware of her thoughts and has been combating negative thoughts with coping statements using her spirituality and prayer.  She also reports using meditation.  She is pleased with her increased efforts to use assertiveness skills as she has set and maintain limits with her child's father.  Per patient's report, she and her child now are residing temporarily with her parents.  She expresses acceptance relationship with child's father just did not work out.  Patient continues to report stress regarding multiple health issues but states trying to take 1 day at a time.  She reports discontinuing the Imuran immunosuppressant drug after 2 weeks as she started experiencing stomach pain.  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  She has follow-up appointment with her providers.  She expresses appropriate concern about this also is using her faith to try to cope.  She reports strong support from her parents.  Call was dropped during session.  Therapist called patient but received voicemail recording voice mailbox is full.. ? ?Suicidal/Homicidal: Nowithout intent/plan ? ?Therapist Response: Reviewed symptoms, discussed stressors, facilitated expression of thoughts and feelings, validated feelings, praised and reinforced patient's increased awareness of thoughts as well as using helpful coping strategies to combat negative thoughts, discussed effects on mood and behavior, praised and reinforced patient's efforts to use assertiveness skills/set-maintain limits,  assisted patient identify/challenge/and replace unhelpful thoughts about prioritizing taking care of self with more helpful thoughts. ? ?plan: Return again in 1 week ? ?Diagnosis: Axis I: PTSD,  MDD ? ?   ?Collaboration of Care: Medication management AEB encouraged patient to follow through with  appointment for med eval/management with psychiatrist at [facility] ? ?Patient/Guardian was advised Release of Information must be obtained prior to any record release in order to collaborate their care with an outside provider. Patient/Guardian was advised if they have not already done so to contact the registration department to sign all necessary forms in order for [place] to release information regarding their care.  ? ?Consent: Patient/Guardian gives verbal consent for treatment and assignment of benefits for services provided during this visit. Patient/Guardian expressed understanding and agreed to proceed.  ? ? ?Kara Cirilo E Islay Polanco, LCSW ?07/29/2021 ? ? ? ?Kara Kemmerer E Forrestine Lecrone, LCSW ? ? ? ? ?

## 2021-08-01 ENCOUNTER — Other Ambulatory Visit: Payer: Self-pay | Admitting: Internal Medicine

## 2021-08-01 DIAGNOSIS — G629 Polyneuropathy, unspecified: Secondary | ICD-10-CM

## 2021-08-04 DIAGNOSIS — I898 Other specified noninfective disorders of lymphatic vessels and lymph nodes: Secondary | ICD-10-CM | POA: Diagnosis not present

## 2021-08-04 DIAGNOSIS — R933 Abnormal findings on diagnostic imaging of other parts of digestive tract: Secondary | ICD-10-CM | POA: Diagnosis not present

## 2021-08-04 DIAGNOSIS — K3189 Other diseases of stomach and duodenum: Secondary | ICD-10-CM | POA: Diagnosis not present

## 2021-08-04 DIAGNOSIS — D490 Neoplasm of unspecified behavior of digestive system: Secondary | ICD-10-CM | POA: Diagnosis not present

## 2021-08-04 HISTORY — PX: BIOPSY STOMACH: PRO33

## 2021-08-26 DIAGNOSIS — F322 Major depressive disorder, single episode, severe without psychotic features: Secondary | ICD-10-CM | POA: Diagnosis not present

## 2021-08-26 DIAGNOSIS — F411 Generalized anxiety disorder: Secondary | ICD-10-CM | POA: Diagnosis not present

## 2021-08-26 DIAGNOSIS — F4312 Post-traumatic stress disorder, chronic: Secondary | ICD-10-CM | POA: Diagnosis not present

## 2021-09-10 DIAGNOSIS — K3189 Other diseases of stomach and duodenum: Secondary | ICD-10-CM | POA: Diagnosis not present

## 2021-09-18 ENCOUNTER — Ambulatory Visit (INDEPENDENT_AMBULATORY_CARE_PROVIDER_SITE_OTHER): Payer: Medicaid Other | Admitting: Psychiatry

## 2021-09-18 DIAGNOSIS — F321 Major depressive disorder, single episode, moderate: Secondary | ICD-10-CM | POA: Diagnosis not present

## 2021-09-18 DIAGNOSIS — F4310 Post-traumatic stress disorder, unspecified: Secondary | ICD-10-CM | POA: Diagnosis not present

## 2021-09-18 NOTE — Progress Notes (Signed)
Virtual Visit via Telephone Note  I connected with Kara Stewart on 09/18/21 at 11:07 AM EDT  by telephone and verified that I am speaking with the correct person using two identifiers.  Location: Patient: Home Provider: [facility] [place] office    I discussed the limitations, risks, security and privacy concerns of performing an evaluation and management service by telephone and the availability of in person appointments. I also discussed with the patient that there may be a patient responsible charge related to this service. The patient expressed understanding and agreed to proceed.    I provided 51 minutes of non-face-to-face time during this encounter.   Adah Salvage, LCSW    THERAPIST PROGRESS NOTE  Session Time: Thursday 09/18/2021 11:09 AM - 12:00 PM   Participation Level: Active  Behavioral Response: CasualAlertAnxious and Depressed  Type of Therapy: Individual Therapy  Treatment Goals addressed: Reduce overall frequency, intensity, and duration of anxiety so that daily functioning is not impaired AEB patient reducing panic episodes to 2 or less per month  Progress toward goals: not progressing     Interventions: CBT and Supportive            Summary: Kara Stewart is a 46 y.o. female who is referred for services by PCP Dr. Allena Katz due to pt experiencing symptoms of depression and anxiety. She denies any psychiatric hospitalizations, She reports participating in therapy through [facility] and last was seen 10 years ago.  Patient states her everyday life is up in the air and she just cannot get it together.  She reports multiple physical ailments and was diagnosed with primary biliary cirrhosis.  She reports worrying about how much longer she has to live.  Current symptoms and problems include emotional outburst, becoming overly emotional about small things, becoming irritated easily, difficulty completing task around the house, and feeling overwhelmed.  Patient also  presents with a trauma history.    Patient last was seen  about 2 months ago.  She reports continued symptoms of anxiety and depression including depressed mood, irritability, emotional outbursts, fatigue, poor motivation, and excessive worry.  Patient reports experiencing panic attacks about 3 to 4 days out of the week.  She also reports symptoms of PTSD including flashbacks and nightmares.  She continues to work with psychiatrist who is adjusting patient's medication.  Per patient's report, she currently is taking Wellbutrin and sertraline.  Patient reports continued stress regarding multiple health issues.  She expresses relief recent CT scan indicates mass in her stomach is gone.  She continues to experience other health issues including severe pain in legs due to varicose veins and problems with her eyes due to shingles in her eye.  Patient reports additional stress regarding trying to work out visitation with her child's father.  She continues to reside with her parents and reports strong support.  However, patient reports physical difficulty residing in their home as she has no place become comfortable physically and she has to climb steps which exacerbates pain in her legs.  Patient reports feeling overwhelmed.  She continues to use spirituality and prayer to try to cope.    Suicidal/Homicidal: Nowithout intent/plan  Therapist Response: Reviewed symptoms, discussed stressors, facilitated expression of thoughts and feelings, validated feelings, reviewed relaxation techniques, developed plan with patient to practice leaves on a stream exercise daily, assisted patient identify ways to create space to do exercise,  emailed patient access code to audio activity, also reviewed deep breathing as a relaxation technique and encouraged patient to continue  practicing, assisted patient identify ways to use assertiveness skills in conversation with child's father regarding visitation,  plan: Return again in 1  week  Diagnosis: Axis I: PTSD, MDD     Collaboration of Care: Medication management AEB encouraged patient to follow through with  appointment for med eval/management with psychiatrist at [facility]  Patient/Guardian was advised Release of Information must be obtained prior to any record release in order to collaborate their care with an outside provider. Patient/Guardian was advised if they have not already done so to contact the registration department to sign all necessary forms in order for [place] to release information regarding their care.   Consent: Patient/Guardian gives verbal consent for treatment and assignment of benefits for services provided during this visit. Patient/Guardian expressed understanding and agreed to proceed.    Adah Salvage, LCSW 09/18/2021    Adah Salvage, LCSW

## 2021-09-22 ENCOUNTER — Encounter (HOSPITAL_COMMUNITY): Payer: Self-pay | Admitting: Psychiatry

## 2021-09-22 NOTE — Progress Notes (Signed)
Patient ID: Kara Stewart, female   DOB: 04/09/1975, 45 y.o.   MRN: [id number]  Therapist prepared letter indicating pt is established pt at this practice and her diagnosis per pt's request to use for documentation.  Therapist sent letter directly to patient.

## 2021-09-25 ENCOUNTER — Other Ambulatory Visit: Payer: Self-pay | Admitting: Internal Medicine

## 2021-09-25 DIAGNOSIS — G629 Polyneuropathy, unspecified: Secondary | ICD-10-CM

## 2021-09-26 DIAGNOSIS — F411 Generalized anxiety disorder: Secondary | ICD-10-CM | POA: Diagnosis not present

## 2021-09-26 DIAGNOSIS — F322 Major depressive disorder, single episode, severe without psychotic features: Secondary | ICD-10-CM | POA: Diagnosis not present

## 2021-09-26 DIAGNOSIS — F4312 Post-traumatic stress disorder, chronic: Secondary | ICD-10-CM | POA: Diagnosis not present

## 2021-09-30 ENCOUNTER — Other Ambulatory Visit: Payer: Self-pay | Admitting: Internal Medicine

## 2021-09-30 ENCOUNTER — Telehealth: Payer: Self-pay

## 2021-09-30 DIAGNOSIS — G629 Polyneuropathy, unspecified: Secondary | ICD-10-CM

## 2021-09-30 MED ORDER — PREGABALIN 50 MG PO CAPS: 50.0000 mg | ORAL_CAPSULE | Freq: Two times a day (BID) | ORAL | 3 refills | Status: DC

## 2021-09-30 NOTE — Telephone Encounter (Signed)
Patient came back into the office pharmacy not received this medicine yet  pregabalin (LYRICA) 50 MG capsule   Pharmacy: [address] [place]

## 2021-10-01 ENCOUNTER — Telehealth: Payer: Self-pay | Admitting: Internal Medicine

## 2021-10-01 NOTE — Telephone Encounter (Signed)
Called pt to advise she needed an appt to refill medications. She states she was going to call back after she calls the pharmacy and finds out what is going on. She states she was notified that she would come pick up the rx at the office from her MyChart & when she called pharmacy they told her they have not received anything. Pt will call back to schedule appt.

## 2021-10-02 ENCOUNTER — Ambulatory Visit (INDEPENDENT_AMBULATORY_CARE_PROVIDER_SITE_OTHER): Payer: Medicaid Other | Admitting: Psychiatry

## 2021-10-02 DIAGNOSIS — F4310 Post-traumatic stress disorder, unspecified: Secondary | ICD-10-CM

## 2021-10-02 DIAGNOSIS — F321 Major depressive disorder, single episode, moderate: Secondary | ICD-10-CM | POA: Diagnosis not present

## 2021-10-02 NOTE — Progress Notes (Signed)
Virtual Visit via Telephone Note  I connected with Kara Stewart on 10/02/21 at 11:10 AM EDT  by telephone and verified that I am speaking with the correct person using two identifiers.  Location: Patient: Library  Provider: [facility] [place] office    I discussed the limitations, risks, security and privacy concerns of performing an evaluation and management service by telephone and the availability of in person appointments. I also discussed with the patient that there may be a patient responsible charge related to this service. The patient expressed understanding and agreed to proceed.     I provided 50 minutes of non-face-to-face time during this encounter.   Adah Salvage, LCSW     THERAPIST PROGRESS NOTE  Session Time: Thursday 10/02/2021 11:10 AM - 12:00 PM   Participation Level: Active  Behavioral Response: CasualAlertAnxious and Depressed  Type of Therapy: Individual Therapy  Treatment Goals addressed: Reduce overall frequency, intensity, and duration of anxiety so that daily functioning is not impaired AEB patient reducing panic episodes to 2 or less per month  Progress toward goals: not progressing     Interventions: CBT and Supportive            Summary: Kara Stewart is a 46 y.o. female who is referred for services by PCP Dr. Allena Katz due to pt experiencing symptoms of depression and anxiety. She denies any psychiatric hospitalizations, She reports participating in therapy through [facility] and last was seen 10 years ago.  Patient states her everyday life is up in the air and she just cannot get it together.  She reports multiple physical ailments and was diagnosed with primary biliary cirrhosis.  She reports worrying about how much longer she has to live.  Current symptoms and problems include emotional outburst, becoming overly emotional about small things, becoming irritated easily, difficulty completing task around the house, and feeling overwhelmed.  Patient  also presents with a trauma history.    Patient last was seen  about 2 weeks ago.  She reports continued symptoms of anxiety and depression along with increased stress since last session.  Per patient's report, she recently learned she was denied again for disability benefits.  She is working with an attorney regarding an appeal but says it will probably take a year before she may receive help.  She reports continued financial stress as she states being behind on some of her bills.  She is contemplating selling her home but reports thoughts of failure when thinking about selling her home.  She also continues to worry about her health.  She continues to work with psychiatrist.  Per patient's report, Zoloft dosage was doubled.  Patient reports trying to practice deep breathing, use positive self talk in her spirituality to cope.  However, she reports feeling very overwhelmed.     Suicidal/Homicidal: Nowithout intent/plan  Therapist Response: Reviewed symptoms, discussed stressors, facilitated expression of thoughts and feelings, validated feelings, assisted patient identify pros and cons of selling her home, assisted patient identify/challenge/and replace negative thoughts about being a failure with more rational thoughts, provided psychoeducation on anxiety and stress response, discussed rationale for and assisted patient practice body scan meditation to increase awareness of tension in the body, used analogy of wet noodle to assist patient release muscle tension, will send patient audio of body scan meditation activity to assist her in her efforts to become more aware of tension in her body and improve efforts to release tension.   plan: Return again in 1 week  Diagnosis: Axis I:  PTSD, MDD     Collaboration of Care: Medication management AEB encouraged patient to follow through with  appointment for med eval/management with psychiatrist at [facility]  Patient/Guardian was advised Release of  Information must be obtained prior to any record release in order to collaborate their care with an outside provider. Patient/Guardian was advised if they have not already done so to contact the registration department to sign all necessary forms in order for [place] to release information regarding their care.   Consent: Patient/Guardian gives verbal consent for treatment and assignment of benefits for services provided during this visit. Patient/Guardian expressed understanding and agreed to proceed.    Adah Salvage, LCSW 10/02/2021    Adah Salvage, LCSW
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
## 2021-10-10 ENCOUNTER — Ambulatory Visit: Payer: Medicaid Other | Admitting: Neurology

## 2021-10-16 ENCOUNTER — Ambulatory Visit (INDEPENDENT_AMBULATORY_CARE_PROVIDER_SITE_OTHER): Payer: Medicaid Other | Admitting: Psychiatry

## 2021-10-16 DIAGNOSIS — F4310 Post-traumatic stress disorder, unspecified: Secondary | ICD-10-CM | POA: Diagnosis not present

## 2021-10-16 DIAGNOSIS — F321 Major depressive disorder, single episode, moderate: Secondary | ICD-10-CM

## 2021-10-16 NOTE — Progress Notes (Signed)
Virtual Visit via Telephone Note  I connected with Kara Stewart on 10/16/21 at 10:07 AM EDT  by telephone and verified that I am speaking with the correct person using two identifiers.  Location: Patient: Home Provider: [facility] [place] office    I discussed the limitations, risks, security and privacy concerns of performing an evaluation and management service by telephone and the availability of in person appointments. I also discussed with the patient that there may be a patient responsible charge related to this service. The patient expressed understanding and agreed to proceed.    I provided 48 minutes of non-face-to-face time during this encounter.   Adah Salvage, LCSW     THERAPIST PROGRESS NOTE  Session Time: Thursday 10/16/2021 10:07 AM  - 10:55 AM       Participation Level: Active  Behavioral Response: CasualAlertAnxious and Depressed  Type of Therapy: Individual Therapy  Treatment Goals addressed: Reduce overall frequency, intensity, and duration of anxiety so that daily functioning is not impaired AEB patient reducing panic episodes to 2 or less per month  Progress toward goals: not progressing     Interventions: CBT and Supportive            Summary: Kara Stewart is a 46 y.o. female who is referred for services by PCP Dr. Allena Katz due to pt experiencing symptoms of depression and anxiety. She denies any psychiatric hospitalizations, She reports participating in therapy through [facility] and last was seen 10 years ago.  Patient states her everyday life is up in the air and she just cannot get it together.  She reports multiple physical ailments and was diagnosed with primary biliary cirrhosis.  She reports worrying about how much longer she has to live.  Current symptoms and problems include emotional outburst, becoming overly emotional about small things, becoming irritated easily, difficulty completing task around the house, and feeling overwhelmed.  Patient also  presents with a trauma history.    Patient last was seen  about 2 weeks ago.  She reports coping better with anxiety and says using the body scan meditation has been very helpful although she has not been able to practice it as much as she would like.  She reports increased depressed mood along with extreme fatigue and drowsiness.  She attributes some of her fatigue along with drowsiness to several of her medications that cause drowsiness.  She expresses frustration and sadness she is unable to engage in play with her daughter and doing physically demanding activities as she wishes due to her physical condition.  She continues to have positive routine with her daughter in the evenings including bathtub and reading stories as well as playing games.  However, patient has thoughts of this not being enough.  She continues to express frustration with self regarding not being able to think and process information like she has in the past.  She has a follow-up appointment with her psychiatrist next week and an appointment with her neurologist tomorrow.  Patient reports she continues to attend church.    Suicidal/Homicidal: Nowithout intent/plan  Therapist Response: Reviewed symptoms, discussed stressors, facilitated expression of thoughts and feelings, praised and reinforced patient's efforts to use body scan meditation, discussed effects, encouraged patient to continue efforts, praised and reinforced patient's efforts in trying to maintain a positive routine, assisted patient identify activities she has accomplished, discussed self compassion and assisted patient identify ways to nurture this, assisted patient identify/challenge/and replace negative thoughts, also discussed ways to nurture her spirituality, encouraged patient to  follow-up with her psychiatrist and neurologist specifically regarding concerns related to fatigue and drowsiness   plan: Return again in 1 week  Diagnosis: Axis I: PTSD,  MDD     Collaboration of Care: Medication management AEB encouraged patient to follow through with  appointment for med eval/management with psychiatrist at [facility]  Patient/Guardian was advised Release of Information must be obtained prior to any record release in order to collaborate their care with an outside provider. Patient/Guardian was advised if they have not already done so to contact the registration department to sign all necessary forms in order for [place] to release information regarding their care.   Consent: Patient/Guardian gives verbal consent for treatment and assignment of benefits for services provided during this visit. Patient/Guardian expressed understanding and agreed to proceed.    Adah Salvage, LCSW 10/16/2021    Adah Salvage, LCSW
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
## 2021-10-17 ENCOUNTER — Encounter: Payer: Self-pay | Admitting: Neurology

## 2021-10-17 ENCOUNTER — Ambulatory Visit: Payer: Medicare Other | Admitting: Neurology

## 2021-10-17 VITALS — BP 113/73 | HR 93 | Ht 64.0 in | Wt 136.0 lb

## 2021-10-17 DIAGNOSIS — F332 Major depressive disorder, recurrent severe without psychotic features: Secondary | ICD-10-CM

## 2021-10-17 DIAGNOSIS — F410 Panic disorder [episodic paroxysmal anxiety] without agoraphobia: Secondary | ICD-10-CM | POA: Diagnosis not present

## 2021-10-17 DIAGNOSIS — M797 Fibromyalgia: Secondary | ICD-10-CM | POA: Diagnosis not present

## 2021-10-17 DIAGNOSIS — R4189 Other symptoms and signs involving cognitive functions and awareness: Secondary | ICD-10-CM | POA: Diagnosis not present

## 2021-10-17 DIAGNOSIS — F411 Generalized anxiety disorder: Secondary | ICD-10-CM | POA: Diagnosis not present

## 2021-10-20 ENCOUNTER — Inpatient Hospital Stay (HOSPITAL_COMMUNITY): Payer: Medicaid Other | Attending: Hematology

## 2021-10-27 ENCOUNTER — Ambulatory Visit (HOSPITAL_COMMUNITY): Payer: Medicaid Other | Admitting: Physician Assistant

## 2021-10-29 ENCOUNTER — Ambulatory Visit: Payer: Medicare Other | Admitting: Adult Health

## 2021-10-29 ENCOUNTER — Encounter: Payer: Self-pay | Admitting: Adult Health

## 2021-10-29 VITALS — BP 102/62 | HR 74 | Ht 64.5 in | Wt 140.0 lb

## 2021-10-29 DIAGNOSIS — N644 Mastodynia: Secondary | ICD-10-CM | POA: Diagnosis not present

## 2021-10-29 DIAGNOSIS — N6311 Unspecified lump in the right breast, upper outer quadrant: Secondary | ICD-10-CM | POA: Diagnosis not present

## 2021-10-29 DIAGNOSIS — N760 Acute vaginitis: Secondary | ICD-10-CM | POA: Diagnosis not present

## 2021-10-29 DIAGNOSIS — N898 Other specified noninflammatory disorders of vagina: Secondary | ICD-10-CM | POA: Diagnosis not present

## 2021-10-29 DIAGNOSIS — L723 Sebaceous cyst: Secondary | ICD-10-CM

## 2021-10-29 DIAGNOSIS — B9689 Other specified bacterial agents as the cause of diseases classified elsewhere: Secondary | ICD-10-CM | POA: Diagnosis not present

## 2021-10-29 LAB — POCT WET PREP (WET MOUNT)
Clue Cells Wet Prep Whiff POC: NEGATIVE
WBC, Wet Prep HPF POC: POSITIVE

## 2021-10-29 MED ORDER — METRONIDAZOLE 500 MG PO TABS: 500.0000 mg | ORAL_TABLET | Freq: Two times a day (BID) | ORAL | 0 refills | Status: DC

## 2021-10-29 NOTE — Progress Notes (Signed)
  Subjective:     Patient ID: Kara Stewart, female   DOB: [birth year], 46 y.o.   MRN: [id number]  HPI Kara Stewart is a 46 year old white female, divorced, [id number] in complaining of pain in right breast and spot in vaginal area and also vaginal discharge. She has gained some weight back. Lab Results  Component Value Date   DIAGPAP  02/12/2021    - Negative for intraepithelial lesion or malignancy (NILM)   HPVHIGH Negative 02/12/2021   PCP is Dr Allena Katz  Review of Systems Right breast pain Spot in vagina +vaginal discharge Reviewed past medical,surgical, social and family history. Reviewed medications and allergies.     Objective:   Physical Exam BP 102/62 (BP Location: Left Arm, Patient Position: Sitting, Cuff Size: Normal)   Pulse 74   Ht 5' 4.5" (1.638 m)   Wt 140 lb (63.5 kg)   BMI 23.66 kg/m      Skin warm and dry,  Breasts:no dominate palpable mass, retraction or nipple discharge on the left, on the right no retraction or nipple discharge, pea sized mass, at 11 0'clock 4 FB from areola, tender. Pelvic: external genitalia is normal in appearance, has 1 cm sebaceous cyst inner right labia, vagina: white discharge with slight odor,urethra has no lesions or masses noted, cervix:smooth and bulbous, uterus: normal size, shape and contour, non tender, no masses felt, adnexa: no masses or tenderness noted. Bladder is non tender and no masses felt. Wet prep: + for clue cells and +WBCs.  Upstream - 10/29/21 1524       Pregnancy Intention Screening   Does the patient want to become pregnant in the next year? No    Does the patient's partner want to become pregnant in the next year? No    Would the patient like to discuss contraceptive options today? No      Contraception Wrap Up   Current Method Female Sterilization    End Method Female Sterilization            Examination chaperoned by Malachy Mood LPN  Assessment:     1. Breast pain, right Right diagnostic mammogram and [place]  scheduled at [facility] for 11/25/21 a 10 am to assess pain and mass  - US BREAST LTD UNI RIGHT INC AXILLA; Future - MM DIAG BREAST TOMO UNI RIGHT; Future  2. Mass of upper outer quadrant of right breast Has pea sized mass at 11 0'clock  - US BREAST LTD UNI RIGHT INC AXILLA; Future - MM DIAG BREAST TOMO UNI RIGHT; Future  3. Vaginal discharge - POCT Wet Prep [facility])  4. BV (bacterial vaginosis) +clue cells, will rx flagyl Meds ordered this encounter  Medications   metroNIDAZOLE (FLAGYL) 500 MG tablet    Sig: Take 1 tablet (500 mg total) by mouth 2 (two) times daily.    Dispense:  14 tablet    Refill:  0    Order Specific Question:   Supervising Provider    Answer:   Duane Lope H [[id number]]    - POCT Wet Prep [facility])  5. Sebaceous cyst Leave alone, can be excised if desired     Plan:     Follow up prn

## 2021-10-30 ENCOUNTER — Ambulatory Visit (INDEPENDENT_AMBULATORY_CARE_PROVIDER_SITE_OTHER): Payer: Medicaid Other | Admitting: Psychiatry

## 2021-10-30 ENCOUNTER — Encounter (HOSPITAL_COMMUNITY): Payer: Self-pay

## 2021-10-30 DIAGNOSIS — F321 Major depressive disorder, single episode, moderate: Secondary | ICD-10-CM | POA: Diagnosis not present

## 2021-10-30 DIAGNOSIS — F4310 Post-traumatic stress disorder, unspecified: Secondary | ICD-10-CM

## 2021-10-30 DIAGNOSIS — F411 Generalized anxiety disorder: Secondary | ICD-10-CM | POA: Diagnosis not present

## 2021-10-30 DIAGNOSIS — F4312 Post-traumatic stress disorder, chronic: Secondary | ICD-10-CM | POA: Diagnosis not present

## 2021-10-30 DIAGNOSIS — F322 Major depressive disorder, single episode, severe without psychotic features: Secondary | ICD-10-CM | POA: Diagnosis not present

## 2021-10-30 NOTE — Progress Notes (Signed)
Virtual Visit via Telephone Note  I connected with Kara Stewart on 10/30/21 at 10:14 AM EDT by telephone and verified that I am speaking with the correct person using two identifiers.  Location: Patient: Home Provider: [facility] Outpatient  office    I discussed the limitations, risks, security and privacy concerns of performing an evaluation and management service by telephone and the availability of in person appointments. I also discussed with the patient that there may be a patient responsible charge related to this service. The patient expressed understanding and agreed to proceed.   I provided 46 minutes of non-face-to-face time during this encounter.   Adah Salvage, LCSW       THERAPIST PROGRESS NOTE  Session Time: Thursday 10/30/2021 10:14 AM - 11:00 AM   Participation Level: Active  Behavioral Response: CasualAlertAnxious and Depressed  Type of Therapy: Individual Therapy  Treatment Goals addressed: Reduce overall frequency, intensity, and duration of anxiety so that daily functioning is not impaired AEB patient reducing panic episodes to 2 or less per month  Progress toward goals: not progressing     Interventions: CBT and Supportive            Summary: Kara Stewart is a 46 y.o. female who is referred for services by PCP Dr. Allena Katz due to pt experiencing symptoms of depression and anxiety. She denies any psychiatric hospitalizations, She reports participating in therapy through [facility] and last was seen 10 years ago.  Patient states her everyday life is up in the air and she just cannot get it together.  She reports multiple physical ailments and was diagnosed with primary biliary cirrhosis.  She reports worrying about how much longer she has to live.  Current symptoms and problems include emotional outburst, becoming overly emotional about small things, becoming irritated easily, difficulty completing task around the house, and feeling overwhelmed.  Patient also  presents with a trauma history.    Patient last was seen  about 2 weeks ago.  She reports continued symptoms of anxiety and states having panic episodes about 2 x per week. Recently, she has been experiencing more symptoms of depression including negative ruminating thoughts about self and decreased conversation with her parents especially her father. She reports still doing her best to take care of her daughter. She misses her while she is in daycare and reports having more time to think about self and situation. She expresses frustration due to  continued memory issues, poor concentration and fatigue. She followed up with neurologist who did not recommend a repeat CT Scan.  She is scheduled to see psychiatrist this week.  Patient continues to attend church. Suicidal/Homicidal: Nowithout intent/plan  Therapist Response: Reviewed symptoms, discussed stressors, facilitated expression of thoughts and feelings, praised and reinforced patient's efforts to try to maintain involvement in activity, assisted patient identify triggers of increased symptoms of depression, reviewed treatment plan, obtained patient's permission to electronically sign plan for patient as this was a virtual visit plan: Return again in 1 week  Diagnosis: Axis I: PTSD, MDD     Collaboration of Care: Medication management AEB encouraged patient to follow through with  appointment for med eval/management with psychiatrist at [facility]  Patient/Guardian was advised Release of Information must be obtained prior to any record release in order to collaborate their care with an outside provider. Patient/Guardian was advised if they have not already done so to contact the registration department to sign all necessary forms in order for [place] to release information regarding their care.  Consent: Patient/Guardian gives verbal consent for treatment and assignment of benefits for services provided during this visit. Patient/Guardian  expressed understanding and agreed to proceed.    Adah Salvage, LCSW 10/30/2021    Adah Salvage, LCSW

## 2021-10-30 NOTE — Plan of Care (Signed)
  Problem: Anxiety Disorder CCP Problem  1 panic attacks ( shaking, face red, hyperventilating) Goal: Reduce overall frequency, intensity, and duration of anxiety so that daily functioning is not impaired AEB patient reducing panic episodes to 2 or less per month Outcome: Progressing Goal: STG: Report a decrease in anxiety symptoms as evidenced by an overall reduction in anxiety score by a minimum of 25% on the Generalized Anxiety Disorder Scale Outcome: Progressing

## 2021-10-31 ENCOUNTER — Other Ambulatory Visit: Payer: Self-pay | Admitting: Internal Medicine

## 2021-10-31 DIAGNOSIS — J45909 Unspecified asthma, uncomplicated: Secondary | ICD-10-CM

## 2021-11-15 ENCOUNTER — Other Ambulatory Visit: Payer: Self-pay | Admitting: Gastroenterology

## 2021-11-15 DIAGNOSIS — K743 Primary biliary cirrhosis: Secondary | ICD-10-CM

## 2021-11-16 ENCOUNTER — Other Ambulatory Visit: Payer: Self-pay | Admitting: Gastroenterology

## 2021-11-16 DIAGNOSIS — K743 Primary biliary cirrhosis: Secondary | ICD-10-CM

## 2021-11-25 ENCOUNTER — Ambulatory Visit (HOSPITAL_COMMUNITY)
Admission: RE | Admit: 2021-11-25 | Discharge: 2021-11-25 | Disposition: A | Discharge status: Home / Self Care | Payer: Medicare Other | Source: Ambulatory Visit | Attending: Adult Health | Admitting: Adult Health

## 2021-11-25 DIAGNOSIS — N644 Mastodynia: Secondary | ICD-10-CM | POA: Insufficient documentation

## 2021-11-25 DIAGNOSIS — R921 Mammographic calcification found on diagnostic imaging of breast: Secondary | ICD-10-CM | POA: Diagnosis not present

## 2021-11-25 DIAGNOSIS — N6311 Unspecified lump in the right breast, upper outer quadrant: Secondary | ICD-10-CM | POA: Diagnosis present

## 2021-11-25 DIAGNOSIS — N6489 Other specified disorders of breast: Secondary | ICD-10-CM | POA: Diagnosis not present

## 2021-12-01 DIAGNOSIS — Z7901 Long term (current) use of anticoagulants: Secondary | ICD-10-CM | POA: Diagnosis not present

## 2021-12-01 DIAGNOSIS — R110 Nausea: Secondary | ICD-10-CM | POA: Diagnosis not present

## 2021-12-01 DIAGNOSIS — R6881 Early satiety: Secondary | ICD-10-CM | POA: Diagnosis not present

## 2021-12-01 DIAGNOSIS — K743 Primary biliary cirrhosis: Secondary | ICD-10-CM | POA: Diagnosis not present

## 2021-12-01 DIAGNOSIS — K7010 Alcoholic hepatitis without ascites: Secondary | ICD-10-CM | POA: Diagnosis not present

## 2021-12-01 DIAGNOSIS — Z79899 Other long term (current) drug therapy: Secondary | ICD-10-CM | POA: Diagnosis not present

## 2021-12-01 DIAGNOSIS — R1011 Right upper quadrant pain: Secondary | ICD-10-CM | POA: Diagnosis not present

## 2021-12-01 DIAGNOSIS — Z86718 Personal history of other venous thrombosis and embolism: Secondary | ICD-10-CM | POA: Diagnosis not present

## 2021-12-01 DIAGNOSIS — Z9081 Acquired absence of spleen: Secondary | ICD-10-CM | POA: Diagnosis not present

## 2021-12-01 DIAGNOSIS — L299 Pruritus, unspecified: Secondary | ICD-10-CM | POA: Diagnosis not present

## 2021-12-01 DIAGNOSIS — R634 Abnormal weight loss: Secondary | ICD-10-CM | POA: Diagnosis not present

## 2021-12-01 DIAGNOSIS — Z6824 Body mass index (BMI) 24.0-24.9, adult: Secondary | ICD-10-CM | POA: Diagnosis not present

## 2021-12-11 DIAGNOSIS — F4312 Post-traumatic stress disorder, chronic: Secondary | ICD-10-CM | POA: Diagnosis not present

## 2021-12-11 DIAGNOSIS — F322 Major depressive disorder, single episode, severe without psychotic features: Secondary | ICD-10-CM | POA: Diagnosis not present

## 2021-12-11 DIAGNOSIS — F411 Generalized anxiety disorder: Secondary | ICD-10-CM | POA: Diagnosis not present

## 2021-12-12 DIAGNOSIS — K743 Primary biliary cirrhosis: Secondary | ICD-10-CM | POA: Diagnosis not present

## 2021-12-25 ENCOUNTER — Encounter (HOSPITAL_COMMUNITY): Payer: Self-pay

## 2021-12-25 ENCOUNTER — Ambulatory Visit (HOSPITAL_COMMUNITY): Payer: Medicaid Other | Admitting: Psychiatry

## 2021-12-25 ENCOUNTER — Telehealth (HOSPITAL_COMMUNITY): Payer: Self-pay | Admitting: Psychiatry

## 2021-12-25 NOTE — Telephone Encounter (Signed)
Therapist attempted to contact patient via text through caregility platform for scheduled appointment, no response. Therapist called patient and received voicemail recording indicating mailbox is full.

## 2022-01-09 ENCOUNTER — Ambulatory Visit (HOSPITAL_COMMUNITY): Payer: Medicaid Other | Admitting: Psychiatry

## 2022-01-13 DIAGNOSIS — F4312 Post-traumatic stress disorder, chronic: Secondary | ICD-10-CM | POA: Diagnosis not present

## 2022-01-13 DIAGNOSIS — F322 Major depressive disorder, single episode, severe without psychotic features: Secondary | ICD-10-CM | POA: Diagnosis not present

## 2022-01-13 DIAGNOSIS — F411 Generalized anxiety disorder: Secondary | ICD-10-CM | POA: Diagnosis not present

## 2022-01-16 ENCOUNTER — Other Ambulatory Visit: Payer: Self-pay | Admitting: Internal Medicine

## 2022-01-21 DIAGNOSIS — R110 Nausea: Secondary | ICD-10-CM | POA: Diagnosis not present

## 2022-01-21 DIAGNOSIS — R195 Other fecal abnormalities: Secondary | ICD-10-CM | POA: Diagnosis not present

## 2022-01-21 DIAGNOSIS — R634 Abnormal weight loss: Secondary | ICD-10-CM | POA: Diagnosis not present

## 2022-01-21 DIAGNOSIS — R1084 Generalized abdominal pain: Secondary | ICD-10-CM | POA: Diagnosis not present

## 2022-01-21 DIAGNOSIS — R12 Heartburn: Secondary | ICD-10-CM | POA: Diagnosis not present

## 2022-01-21 DIAGNOSIS — R140 Abdominal distension (gaseous): Secondary | ICD-10-CM | POA: Diagnosis not present

## 2022-01-21 DIAGNOSIS — R197 Diarrhea, unspecified: Secondary | ICD-10-CM | POA: Diagnosis not present

## 2022-01-21 DIAGNOSIS — R142 Eructation: Secondary | ICD-10-CM | POA: Diagnosis not present

## 2022-01-21 DIAGNOSIS — K5900 Constipation, unspecified: Secondary | ICD-10-CM | POA: Diagnosis not present

## 2022-01-23 ENCOUNTER — Ambulatory Visit (INDEPENDENT_AMBULATORY_CARE_PROVIDER_SITE_OTHER): Payer: Medicaid Other | Admitting: Psychiatry

## 2022-01-23 DIAGNOSIS — F4310 Post-traumatic stress disorder, unspecified: Secondary | ICD-10-CM

## 2022-01-23 DIAGNOSIS — F321 Major depressive disorder, single episode, moderate: Secondary | ICD-10-CM | POA: Diagnosis not present

## 2022-01-23 NOTE — Progress Notes (Signed)
Virtual Visit via Telephone Note  I connected with Kara Stewart on 01/23/22 at 11:07 AM EDT  by telephone and verified that I am speaking with the correct person using two identifiers.  Location: Patient: Home Provider: [place] office    I discussed the limitations, risks, security and privacy concerns of performing an evaluation and management service by telephone and the availability of in person appointments. I also discussed with the patient that there may be a patient responsible charge related to this service. The patient expressed understanding and agreed to proceed.    I provided 57 minutes of non-face-to-face time during this encounter.   Alonza Smoker, LCSW    THERAPIST PROGRESS NOTE  Session Time: Friday 01/23/2022  11:07 AM - 12:04 PM  Participation Level: Active  Behavioral Response: CasualAlertAnxious and Depressed  Type of Therapy: Individual Therapy  Treatment Goals addressed: Reduce overall frequency, intensity, and duration of anxiety so that daily functioning is not impaired AEB patient reducing panic episodes to 2 or less per month  Progress toward goals: not progressing     Interventions: CBT and Supportive            Summary: Kara Stewart is a 46 y.o. female who is referred for services by PCP Dr. Posey Pronto due to pt experiencing symptoms of depression and anxiety. She denies any psychiatric hospitalizations, She reports participating in therapy through [facility] and last was seen 10 years ago.  Patient states her everyday life is up in the air and she just cannot get it together.  She reports multiple physical ailments and was diagnosed with primary biliary cirrhosis.  She reports worrying about how much longer she has to live.  Current symptoms and problems include emotional outburst, becoming overly emotional about small things, becoming irritated easily, difficulty completing task around the house, and feeling overwhelmed.  Patient also  presents with a trauma history.    Patient last was seen  about 3 months  ago.  She reports continued symptoms of anxiety as well as depression since last session.  She reports poor concentration, worrying, muscle tension, and crying spells.  She has been using her spirituality as well as a mindfulness meditation.and reports these strategies have been helpful.  She expresses some relief she put her home on the market for sale this past week.  However, she reports increased stress regarding her living situation as she and her daughter continue to reside with her parents.  Per patient's report, there is increased tension in the relationship with her mother.  She expresses anger, frustration, as well as hurt.  She also expresses disappointment mother has not been helping her in the way she has in the past.  Patient also reports stress regarding the effects of poor concentration on her ability to interact with others as well as organize/complete task.  Per her report, her psychiatrist still suspects these issues are neurological and has recommended patient work with neurologist to get a another CT scan.  However, patient's current neurologist refuses to do this per her report.   Suicidal/Homicidal: Nowithout intent/plan  Therapist Response: Reviewed symptoms, discussed stressors, facilitated expression of thoughts and feelings, praised and reinforced patient's efforts to use helpful coping strategies, assisted patient examine her thought patterns and expectations regarding the relationship with her mother, assisted patient began to identify realistic expectations regarding mother, began to identify ways to improve communication along with empathic skills, developed plan with patient to continue practicing relaxation techniques   plan: Return again in  2 weeks  Diagnosis: Axis I: PTSD, MDD     Collaboration of Care: Medication management AEB encouraged patient to follow through with  appointment for med  eval/management with psychiatrist at [place] was advised Release of Information must be obtained prior to any record release in order to collaborate their care with an outside provider. Patient/Guardian was advised if they have not already done so to contact the registration department to sign all necessary forms in order for [place] to release information regarding their care.   Consent: Patient/Guardian gives verbal consent for treatment and assignment of benefits for services provided during this visit. Patient/Guardian expressed understanding and agreed to proceed.    Alonza Smoker, LCSW 01/23/2022    Alonza Smoker, LCSW

## 2022-02-05 ENCOUNTER — Other Ambulatory Visit: Payer: Self-pay | Admitting: Internal Medicine

## 2022-02-06 ENCOUNTER — Ambulatory Visit (HOSPITAL_COMMUNITY): Payer: Medicaid Other | Admitting: Psychiatry

## 2022-02-12 DIAGNOSIS — F322 Major depressive disorder, single episode, severe without psychotic features: Secondary | ICD-10-CM | POA: Diagnosis not present

## 2022-02-12 DIAGNOSIS — F4312 Post-traumatic stress disorder, chronic: Secondary | ICD-10-CM | POA: Diagnosis not present

## 2022-02-12 DIAGNOSIS — F411 Generalized anxiety disorder: Secondary | ICD-10-CM | POA: Diagnosis not present

## 2022-02-18 DIAGNOSIS — Z79899 Other long term (current) drug therapy: Secondary | ICD-10-CM | POA: Diagnosis not present

## 2022-02-18 DIAGNOSIS — F900 Attention-deficit hyperactivity disorder, predominantly inattentive type: Secondary | ICD-10-CM | POA: Diagnosis not present

## 2022-03-02 DIAGNOSIS — F322 Major depressive disorder, single episode, severe without psychotic features: Secondary | ICD-10-CM | POA: Diagnosis not present

## 2022-03-02 DIAGNOSIS — F411 Generalized anxiety disorder: Secondary | ICD-10-CM | POA: Diagnosis not present

## 2022-03-02 DIAGNOSIS — F4312 Post-traumatic stress disorder, chronic: Secondary | ICD-10-CM | POA: Diagnosis not present

## 2022-03-04 ENCOUNTER — Telehealth: Payer: Self-pay | Admitting: Radiology

## 2022-03-04 NOTE — Telephone Encounter (Signed)
Maureen called, LMVM asking for medical records that the patient had sent a release for.  Release asks for 04/17/21 forward, and there are no notes to send for those dates.  I called Marisue Humble and advised.

## 2022-03-05 ENCOUNTER — Ambulatory Visit (HOSPITAL_COMMUNITY): Payer: Medicaid Other | Admitting: Psychiatry

## 2022-03-05 ENCOUNTER — Telehealth (HOSPITAL_COMMUNITY): Payer: Self-pay | Admitting: Psychiatry

## 2022-03-05 NOTE — Telephone Encounter (Signed)
Therapist attempted to contact patient via phone for scheduled appointment and received message voicemail box is full.

## 2022-03-09 DIAGNOSIS — K743 Primary biliary cirrhosis: Secondary | ICD-10-CM | POA: Diagnosis not present

## 2022-03-19 ENCOUNTER — Ambulatory Visit (HOSPITAL_COMMUNITY): Payer: Medicaid Other | Admitting: Psychiatry

## 2022-03-19 ENCOUNTER — Telehealth (HOSPITAL_COMMUNITY): Payer: Self-pay | Admitting: Psychiatry

## 2022-03-19 NOTE — Telephone Encounter (Signed)
Therapist attempted to contact patient via phone for scheduled appointment. Therapist received voicemail message indicating voicemail box is full. 

## 2022-03-20 ENCOUNTER — Encounter (HOSPITAL_COMMUNITY): Payer: Self-pay | Admitting: Psychiatry

## 2022-04-02 ENCOUNTER — Ambulatory Visit (INDEPENDENT_AMBULATORY_CARE_PROVIDER_SITE_OTHER): Payer: Medicaid Other | Admitting: Psychiatry

## 2022-04-02 DIAGNOSIS — F4310 Post-traumatic stress disorder, unspecified: Secondary | ICD-10-CM | POA: Diagnosis not present

## 2022-04-02 DIAGNOSIS — F321 Major depressive disorder, single episode, moderate: Secondary | ICD-10-CM

## 2022-04-02 NOTE — Progress Notes (Signed)
Virtual Visit via Telephone Note  I connected with Kara Stewart on 04/02/22 at 9:06 AM EST  by telephone and verified that I am speaking with the correct person using two identifiers.  Location: Patient: Home Provider: [facility]    I discussed the limitations, risks, security and privacy concerns of performing an evaluation and management service by telephone and the availability of in person appointments. I also discussed with the patient that there may be a patient responsible charge related to this service. The patient expressed understanding and agreed to proceed.   I provided 44 minutes of non-face-to-face time during this encounter.   Adah Salvage, LCSW     THERAPIST PROGRESS NOTE  Session Time: Thursday  04/02/2022  9:06 AM - 9:50 AM   Participation Level: Active  Behavioral Response: CasualAlertAnxious and Depressed  Type of Therapy: Individual Therapy  Treatment Goals addressed: Reduce overall frequency, intensity, and duration of anxiety so that daily functioning is not impaired AEB patient reducing panic episodes to 2 or less per month  Progress toward goals: not progressing     Interventions: CBT and Supportive            Summary: Kara Stewart is a 46 y.o. female who is referred for services by PCP Dr. Allena Katz due to pt experiencing symptoms of depression and anxiety. She denies any psychiatric hospitalizations, She reports participating in therapy through [facility] and last was seen 10 years ago.  Patient states her everyday life is up in the air and she just cannot get it together.  She reports multiple physical ailments and was diagnosed with primary biliary cirrhosis.  She reports worrying about how much longer she has to live.  Current symptoms and problems include emotional outburst, becoming overly emotional about small things, becoming irritated easily, difficulty completing task around the house, and feeling overwhelmed.  Patient also  presents with a trauma history.    Patient last was seen  about 2 months  ago.  She reports continued symptoms of anxiety as since last session.  She reports experiencing irritability daily and being snappy.  She is thankful she has sold her home and has purchased another home.  She expresses frustration as she had to sell her home for much less than she anticipated.  She had to get a personal loan to help finance her new home.  She reports financial stress as her first payment is due in about 2 weeks.  She expresses anger, resentment, and frustration regarding the relationship with her mother as well as with her brother.  She expresses disappointment as mother and brother have not helped her and the way she thinks they should.  She also expresses frustration as she reports mother treats patient's brother more favorably that she does patient.  Patient verbalizes frequent should and ought statements about relationship with mother and brother.  Patient reports she is trying to practice relaxation techniques but wants to do so more consistently. Suicidal/Homicidal: Nowithout intent/plan  Therapist Response: Reviewed symptoms, praised and reinforced patient's efforts to practice relaxation techniques, developed plan with patient to practice daily, discussed stressors, facilitated expression of thoughts and feelings, validated feelings, assisted patient examine her thought patterns regarding rate interaction with her mother and brother, assisted patient identify realistic expectations and replace should and ought statements with wish/prefer, assisted patient identify realistic expectations of self and ways to problem solve/pace self, developed plan with patient to complete daily gratitude list   plan: Return again in 2 weeks  Diagnosis: Axis I: PTSD, MDD     Collaboration of Care: Medication management AEB encouraged patient to follow through with  appointment for med eval/management with psychiatrist at  [facility]  Patient/Guardian was advised Release of Information must be obtained prior to any record release in order to collaborate their care with an outside provider. Patient/Guardian was advised if they have not already done so to contact the registration department to sign all necessary forms in order for [place] to release information regarding their care.   Consent: Patient/Guardian gives verbal consent for treatment and assignment of benefits for services provided during this visit. Patient/Guardian expressed understanding and agreed to proceed.    Adah Salvage, LCSW 04/02/2022    Adah Salvage, LCSW

## 2022-04-06 ENCOUNTER — Other Ambulatory Visit: Payer: Self-pay

## 2022-04-06 ENCOUNTER — Encounter (HOSPITAL_BASED_OUTPATIENT_CLINIC_OR_DEPARTMENT_OTHER): Payer: Self-pay | Admitting: *Deleted

## 2022-04-06 ENCOUNTER — Emergency Department (HOSPITAL_BASED_OUTPATIENT_CLINIC_OR_DEPARTMENT_OTHER): Payer: Medicaid Other | Admitting: Radiology

## 2022-04-06 ENCOUNTER — Emergency Department (HOSPITAL_BASED_OUTPATIENT_CLINIC_OR_DEPARTMENT_OTHER)
Admission: EM | Admit: 2022-04-06 | Discharge: 2022-04-06 | Disposition: A | Discharge status: Home / Self Care | Payer: Medicaid Other | Attending: Emergency Medicine | Admitting: Emergency Medicine

## 2022-04-06 DIAGNOSIS — U071 COVID-19: Secondary | ICD-10-CM | POA: Diagnosis not present

## 2022-04-06 DIAGNOSIS — Z7901 Long term (current) use of anticoagulants: Secondary | ICD-10-CM | POA: Insufficient documentation

## 2022-04-06 DIAGNOSIS — R0602 Shortness of breath: Secondary | ICD-10-CM | POA: Diagnosis not present

## 2022-04-06 DIAGNOSIS — J0110 Acute frontal sinusitis, unspecified: Secondary | ICD-10-CM | POA: Insufficient documentation

## 2022-04-06 DIAGNOSIS — J069 Acute upper respiratory infection, unspecified: Secondary | ICD-10-CM | POA: Diagnosis not present

## 2022-04-06 DIAGNOSIS — R509 Fever, unspecified: Secondary | ICD-10-CM | POA: Diagnosis present

## 2022-04-06 LAB — COMPREHENSIVE METABOLIC PANEL
ALT: 15 U/L (ref 0–44)
AST: 16 U/L (ref 15–41)
Albumin: 4.3 g/dL (ref 3.5–5.0)
Alkaline Phosphatase: 81 U/L (ref 38–126)
Anion gap: 9 (ref 5–15)
BUN: 9 mg/dL (ref 6–20)
CO2: 28 mmol/L (ref 22–32)
Calcium: 9.4 mg/dL (ref 8.9–10.3)
Chloride: 102 mmol/L (ref 98–111)
Creatinine, Ser: 0.57 mg/dL (ref 0.44–1.00)
GFR, Estimated: >60 mL/min (ref >60)
Glucose, Bld: 94 mg/dL (ref 70–99)
Potassium: 3 mmol/L — ABNORMAL LOW (ref 3.5–5.1)
Sodium: 139 mmol/L (ref 135–145)
Total Bilirubin: 0.5 mg/dL (ref 0.3–1.2)
Total Protein: 7.8 g/dL (ref 6.5–8.1)

## 2022-04-06 LAB — CBC WITH DIFFERENTIAL/PLATELET
Abs Immature Granulocytes: 0.15 10*3/uL — ABNORMAL HIGH (ref 0.00–0.07)
Basophils Absolute: 0.1 10*3/uL (ref 0.0–0.1)
Basophils Relative: 1 %
Eosinophils Absolute: 0.2 10*3/uL (ref 0.0–0.5)
Eosinophils Relative: 1 %
HCT: 41.2 % (ref 36.0–46.0)
Hemoglobin: 13.8 g/dL (ref 12.0–15.0)
Immature Granulocytes: 1 %
Lymphocytes Relative: 22 %
Lymphs Abs: 4.5 10*3/uL — ABNORMAL HIGH (ref 0.7–4.0)
MCH: 30.3 pg (ref 26.0–34.0)
MCHC: 33.5 g/dL (ref 30.0–36.0)
MCV: 90.5 fL (ref 80.0–100.0)
Monocytes Absolute: 1.6 10*3/uL — ABNORMAL HIGH (ref 0.1–1.0)
Monocytes Relative: 8 %
Neutro Abs: 14.1 10*3/uL — ABNORMAL HIGH (ref 1.7–7.7)
Neutrophils Relative %: 67 %
Platelets: 420 10*3/uL — ABNORMAL HIGH (ref 150–400)
RBC: 4.55 MIL/uL (ref 3.87–5.11)
RDW: 12.9 % (ref 11.5–15.5)
WBC Morphology: ABNORMAL
WBC: 20.7 10*3/uL — ABNORMAL HIGH (ref 4.0–10.5)
nRBC: 0 % (ref 0.0–0.2)

## 2022-04-06 LAB — URINALYSIS, ROUTINE W REFLEX MICROSCOPIC
Bacteria, UA: NONE SEEN
Bilirubin Urine: NEGATIVE
Glucose, UA: NEGATIVE mg/dL
Hgb urine dipstick: NEGATIVE
Nitrite: NEGATIVE
Protein, ur: 30 mg/dL — AB
Specific Gravity, Urine: 1.044 — ABNORMAL HIGH (ref 1.005–1.030)
pH: 6 (ref 5.0–8.0)

## 2022-04-06 LAB — LACTIC ACID, PLASMA: Lactic Acid, Venous: 0.8 mmol/L (ref 0.5–1.9)

## 2022-04-06 LAB — RESP PANEL BY RT-PCR (RSV, FLU A&B, COVID)  RVPGX2
Influenza A by PCR: NEGATIVE
Influenza B by PCR: NEGATIVE
Resp Syncytial Virus by PCR: NEGATIVE
SARS Coronavirus 2 by RT PCR: POSITIVE — AB

## 2022-04-06 LAB — GROUP A STREP BY PCR: Group A Strep by PCR: NOT DETECTED

## 2022-04-06 MED ORDER — DOXYCYCLINE HYCLATE 100 MG PO TABS
100.0000 mg | ORAL_TABLET | Freq: Once | ORAL | Status: AC
  Administered 2022-04-06: 100 mg via ORAL
  Filled 2022-04-06: qty 1

## 2022-04-06 MED ORDER — ONDANSETRON HCL 4 MG PO TABS
4.0000 mg | ORAL_TABLET | Freq: Four times a day (QID) | ORAL | 0 refills | Status: DC
Start: 2022-04-06 — End: 2023-03-11

## 2022-04-06 MED ORDER — SODIUM CHLORIDE 0.9 % IV BOLUS
1000.0000 mL | Freq: Once | INTRAVENOUS | Status: AC
  Administered 2022-04-06: 1000 mL via INTRAVENOUS

## 2022-04-06 MED ORDER — ONDANSETRON HCL 4 MG/2ML IJ SOLN
4.0000 mg | Freq: Once | INTRAMUSCULAR | Status: AC
  Administered 2022-04-06: 4 mg via INTRAVENOUS
  Filled 2022-04-06: qty 2

## 2022-04-06 MED ORDER — POTASSIUM CHLORIDE CRYS ER 20 MEQ PO TBCR
40.0000 meq | EXTENDED_RELEASE_TABLET | Freq: Once | ORAL | Status: AC
  Administered 2022-04-06: 40 meq via ORAL
  Filled 2022-04-06: qty 2

## 2022-04-06 MED ORDER — OXYCODONE-ACETAMINOPHEN 5-325 MG PO TABS
1.0000 | ORAL_TABLET | Freq: Once | ORAL | Status: AC
  Administered 2022-04-06: 1 via ORAL
  Filled 2022-04-06: qty 1

## 2022-04-06 MED ORDER — DOXYCYCLINE HYCLATE 100 MG PO CAPS
100.0000 mg | ORAL_CAPSULE | Freq: Two times a day (BID) | ORAL | 0 refills | Status: DC
Start: 2022-04-06 — End: 2022-05-11

## 2022-04-06 NOTE — ED Provider Notes (Signed)
[facility] EMERGENCY DEPT Provider Note   CSN: [id number] Arrival date & time: 04/06/22  1231     History  Chief Complaint  Patient presents with   Covid Positive    Kara Stewart is a 47 y.o. 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  She denies chest pain, dysuria,  HPI     Home Medications Prior to Admission medications   Medication Sig Start Date End Date Taking? Authorizing Provider  doxycycline (VIBRAMYCIN) 100 MG capsule Take 1 capsule (100 mg total) by mouth 2 (two) times daily. 04/06/22  Yes Dior Stepter H, PA-C  ondansetron (ZOFRAN) 4 MG tablet Take 1 tablet (4 mg total) by mouth every 6 (six) hours. 04/06/22  Yes Anniebell Bedore H, PA-C  acetaminophen (TYLENOL) 500 MG tablet Take 1,000 mg by mouth every 6 (six) hours as needed.    [provider]  albuterol (VENTOLIN HFA) 108 (90 Base) MCG/ACT inhaler INHALE 2 PUFFS INTO THE LUNGS EVERY 6 HOURS AS NEEDED FOR WHEEZING OR SHORTNESS OF BREATH 10/31/21   Anabel Halon, MD  budesonide-formoterol [facility]) 80-4.5 MCG/ACT inhaler Take 2 puffs first thing in am and then another 2 puffs about 12 hours later. 10/17/20   Nyoka Cowden, MD  buPROPion (WELLBUTRIN XL) 300 MG 24 hr tablet Take 300 mg by mouth daily.    [provider]  Calcium Carb-Cholecalciferol (CALCIUM/VITAMIN D PO) Take by mouth daily.    [provider]  Cholecalciferol (VITAMIN D3) 125 MCG (5000 UT) CAPS Take 1 capsule (5,000 Units total) by mouth daily.  06/23/21   Carnella Guadalajara, PA-C  cyanocobalamin (,VITAMIN B-12,) 1000 MCG/ML injection ADMINISTER 1 ML(1000 MCG) IN THE MUSCLE EVERY 30 DAYS 06/16/21   Tiffany Kocher, PA-C  ELIQUIS 5 MG TABS tablet TAKE 1 TABLET(5 MG) BY MOUTH TWICE DAILY 02/05/22   Anabel Halon, MD  FOLIC ACID PO Take by mouth.    [provider]  gabapentin (NEURONTIN) 100 MG capsule Take by mouth. 06/27/21   [provider]  hydrOXYzine (VISTARIL) 25 MG capsule Take 1 capsule (25 mg total) by mouth every 8 (eight) hours as needed for itching. 04/23/21   Anabel Halon, MD  metroNIDAZOLE (FLAGYL) 500 MG tablet Take 1 tablet (500 mg total) by mouth 2 (two) times daily. 10/29/21   Adline Potter, NP  MILK THISTLE PO Take by mouth daily.    [provider]  mycophenolate (CELLCEPT) 250 MG capsule Take by mouth. 09/23/21   [provider]  OVER THE COUNTER MEDICATION Premeir protein drink once a day    [provider]  pantoprazole (PROTONIX) 40 MG tablet Take 1 tablet (40 mg total) by mouth 2 (two) times daily before a meal. Patient taking differently: Take 40 mg by mouth. 08/07/20   Tiffany Kocher, PA-C  prednisoLONE acetate (PRED FORTE) 1 % ophthalmic suspension Place into the left eye. 06/04/21   [provider]  pregabalin (LYRICA) 50 MG capsule Take 1 capsule (50 mg total) by mouth 2 (two) times daily. Patient taking differently: Take 50 mg by mouth as needed. 09/30/21  Lindell Spar, MD  sertraline (ZOLOFT) 50 MG tablet TAKE 1 TABLET(50 MG) BY MOUTH DAILY Patient taking differently: 200 mg. 06/27/21   Lindell Spar, MD  UNABLE TO FIND Iron infusion-every 3 months    [provider]  ursodiol (ACTIGALL) 500 MG tablet TAKE 1 TABLET BY MOUTH TWICE DAILY 11/18/21   Annitta Needs, NP  ursodiol (ACTIGALL) 500 MG tablet TAKE 1 TABLET BY MOUTH TWICE DAILY 11/18/21   Annitta Needs, NP  valACYclovir (VALTREX) 1000 MG tablet Take 1,000 mg by mouth daily. 06/17/21    [provider]      Allergies    Amoxicillin-pot clavulanate, Penicillins, Avelox [moxifloxacin], and Lidocaine    Review of Systems   Review of Systems  HENT:  Positive for congestion, sinus pressure, sinus pain, sneezing and sore throat.   Respiratory:  Positive for cough.   All other systems reviewed and are negative.   Physical Exam Updated Vital Signs BP 107/74   Pulse 72   Temp 97.6 F (36.4 C) (Oral)   Resp (!) 29   SpO2 99%  Physical Exam Vitals and nursing note reviewed.  Constitutional:      General: She is not in acute distress.    Appearance: Normal appearance. She is ill-appearing.  HENT:     Head: Normocephalic and atraumatic.     Mouth/Throat:     Mouth: Mucous membranes are dry.     Comments: She does have some whitish exudate, swollen tonsils in the posterior oropharynx, as well as very dry appearance of tongue, and mucosa in the oropharynx, she has no uvular deviation, unilateral tonsillar swelling suggestive of PTA, she has no floor of mouth redness, swelling Eyes:     General:        Right eye: No discharge.        Left eye: No discharge.     Comments: Bilateral conjunctival redness without exudate noted, no proptosis or preseptal cellulitis on exam  Cardiovascular:     Rate and Rhythm: Normal rate and regular rhythm.     Heart sounds: No murmur heard.    No friction rub. No gallop.  Pulmonary:     Effort: Pulmonary effort is normal.     Breath sounds: Normal breath sounds.     Comments: No wheezing, rhonchi, stridor, respiratory distress, focal consolidation Abdominal:     General: Bowel sounds are normal.     Palpations: Abdomen is soft.  Skin:    General: Skin is warm and dry.     Capillary Refill: Capillary refill takes less than 2 seconds.  Neurological:     Mental Status: She is alert and oriented to person, place, and time.  Psychiatric:        Mood and Affect: Mood normal.        Behavior: Behavior normal.     ED Results  / Procedures / Treatments   Labs (all labs ordered are listed, but only abnormal results are displayed) Labs Reviewed  RESP PANEL BY RT-PCR (RSV, FLU A&B, COVID)  RVPGX2 - Abnormal; Notable for the following components:      Result Value   SARS Coronavirus 2 by RT PCR POSITIVE (*)    All other components within normal limits  CBC WITH DIFFERENTIAL/PLATELET - Abnormal; Notable for the following components:   WBC 20.7 (*)    Platelets 420 (*)    Neutro Abs 14.1 (*)    Lymphs Abs 4.5 (*)    Monocytes Absolute 1.6 (*)  Abs Immature Granulocytes 0.15 (*)    All other components within normal limits  COMPREHENSIVE METABOLIC PANEL - Abnormal; Notable for the following components:   Potassium 3.0 (*)    All other components within normal limits  URINALYSIS, ROUTINE W REFLEX MICROSCOPIC - Abnormal; Notable for the following components:   Specific Gravity, Urine 1.044 (*)    Ketones, ur TRACE (*)    Protein, ur 30 (*)    Leukocytes,Ua TRACE (*)    All other components within normal limits  GROUP A STREP BY PCR  LACTIC ACID, PLASMA  PATHOLOGIST SMEAR REVIEW    EKG None  Radiology DG Chest 2 View  Result Date: 04/06/2022 CLINICAL DATA:  Shortness of breath. Upper respiratory infection. Tested positive for COVID after Christmas. EXAM: CHEST - 2 VIEW COMPARISON:  Chest two views 12/13/2020 FINDINGS: Cardiac silhouette and mediastinal contours are within normal limits. The lungs are clear. No pleural effusion or pneumothorax. No acute skeletal abnormality. IMPRESSION: No active cardiopulmonary disease. Electronically Signed   By: Yvonne Kendall M.D.   On: 04/06/2022 16:23    Procedures Procedures    Medications Ordered in ED Medications  ondansetron (ZOFRAN) injection 4 mg (4 mg Intravenous Given 04/06/22 1547)  oxyCODONE-acetaminophen (PERCOCET/ROXICET) 5-325 MG per tablet 1 tablet (1 tablet Oral Given 04/06/22 1546)  sodium chloride 0.9 % bolus 1,000 mL (1,000 mLs Intravenous New  Bag/Given 04/06/22 1627)  potassium chloride SA (KLOR-CON M) CR tablet 40 mEq (40 mEq Oral Given 04/06/22 1752)  doxycycline (VIBRA-TABS) tablet 100 mg (100 mg Oral Given 04/06/22 1753)    ED Course/ Medical Decision Making/ A&P Clinical Course as of 04/06/22 [place] Apr 06, 2022  1654 I reevaluated this patient at bedside.  She is a 47 year old female presenting with a chief complaint of fever cough and congestion. She states that she was diagnosed with COVID 7 days ago and initially was starting to improve but 2 days ago started getting acute worsening.  She is otherwise now ambulatory tolerating p.o. intake though she feels severe malaise and fatigue.  Patient is already been treated with IV fluids and antipyretics today in the emergency department and now feels symptomatically improved. Had a prolonged conversation regarding her ongoing care and management.  Given the bimodal description of her illness, this is likely secondary sinusitis.  Her white count is elevated though she took 20 mg of prednisone approximately 36 hours ago from her mother-in-law to try to treat the illness. She has no other focal findings for acute bacterial infection though she does have significant risk given mycophenolate use as well as splenectomy.  No abdominal pain no urinary discomfort no meningismus.  All symptoms are contained to upper respiratory at this time. After our prolonged conversation, we will treat for sinusitis with doxycycline, reassessment with PCP within 48 hours to ensure ongoing improvement or plan to return to the emergency department if symptomatically worsening. [CC]    Clinical Course User Index [CC] Tretha Sciara, MD                           Medical Decision Making Amount and/or Complexity of Data Reviewed Labs: ordered. Radiology: ordered.  Risk Prescription drug management.   This patient is a 47 y.o. female who presents to the ED for concern of fever, cough, congestion, sinus  pressure, sore throat, general malaise, decreased appetite, concern for dehydration, this involves an extensive number of treatment options, and is a complaint that  carries with it a high risk of complications and morbidity. The emergent differential diagnosis prior to evaluation includes, but is not limited to, COVID, flu, RSV, bacterial sinusitis, pneumonia, bronchitis, other ENT emergency, AOM, AOE, mastoiditis,.   This is not an exhaustive differential.   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 Pertinent results include: Extensively reviewed patient's outpatient specialist appointments for her chronic health issues largely with [facility]  Notably patient took 20 mg of prednisone approximately 36 hours ago that her mother provided for her  Physical Exam: Physical exam performed. The pertinent findings include: Patient is clinically somewhat ill-appearing, dry on exam, she does have very minimal sinus pressure on palpation, but lungs are overall unremarkable on exam, no wheezing, rhonchi, stridor, rales  Lab Tests: I ordered, and personally interpreted labs.  The pertinent results include: Patient with significant leukocytosis, white blood cells 20.7, I do have some suspicion of a degree of hemoconcentration with some thrombocytosis as well, but additional leukocytosis likely secondary to recent prednisone use.  Her CMP is notable for hypokalemia, testing 3.0, we will orally replete, encourage recheck.  RVP is positive for COVID which she had been confirmed in the past.  Her UA is with high specific gravity adding to support for patient's overall clinical dehydration.   Imaging Studies: I ordered imaging studies including plain film  chest x-ray. I independently visualized and interpreted imaging which showed no acute thoracic abnormality. I agree with the radiologist interpretation.   Medications: I ordered medication including fluids, potassium, doxycycline, Zofran for dehydration, nausea, hypokalemia, and what appears to be an early bacterial sinusitis especially with bimodal sickening pattern. Reevaluation of the patient after these medicines showed that the patient improved. I have reviewed the patients home medicines and have made adjustments as needed.  Patient discharged with Doxycycline to go home on to cover for bacterial sinusitis.  Disposition: After consideration of the diagnostic results and the patients response to treatment, I feel that she has some explanation for her symptoms with evidence of COVID infection given her multiple sources of immunosuppression, both CellCept and asplenia I do think that it would be prudent to treat her for a possible early bacterial sinusitis especially with her bimodal sickening pattern, she is feeling significantly better after fluids, Zofran, and I think that she is appropriate for discharge especially with stable vital signs, she did have a few documented episodes of tachypnea, however on my exam she has not been in respiratory distress, had no respiratory distress at time of her discharge.  She is having stable oxygen saturation on room air, she is afebrile, with stable blood pressure, no tachycardia.Marland Kitchen   emergency department workup does not suggest an emergent condition requiring admission or immediate intervention beyond what has been performed at this time. The plan is: as above. The patient is safe for discharge and has been instructed to return immediately for worsening symptoms, change in symptoms or any other concerns.  I discussed this case with my attending physician Dr. Doran Durand who cosigned this note including patient's presenting symptoms, physical exam, and planned  diagnostics and interventions. Attending physician stated agreement with plan or made changes to plan which were implemented.    Final Clinical Impression(s) / ED Diagnoses Final diagnoses:  Acute non-recurrent frontal sinusitis  COVID  Rx / DC Orders ED Discharge Orders          Ordered    doxycycline (VIBRAMYCIN) 100 MG capsule  2 times daily        04/06/22 1657    ondansetron (ZOFRAN) 4 MG tablet  Every 6 hours        04/06/22 1700              Lura Falor, Harrel Carina, PA-C 04/06/22 Marcy Salvo, MD 04/06/22 2029

## 2022-04-06 NOTE — ED Notes (Signed)
Pt alert, NAD, calm, interactive, resps e/u, speaking clearly. Mentions pain improved. Reports runny nose and congestion.

## 2022-04-06 NOTE — ED Notes (Signed)
Pt given discharge instructions and reviewed prescriptions. Opportunities given for questions. IV removed. Pt verbalizes understanding. Leanne Chang, RN

## 2022-04-06 NOTE — Discharge Instructions (Addendum)
Encourage over-the-counter Tylenol, Flonase, sinus rinse to help with your sinus pressure and pain, please take the entire course of antibiotics that we have prescribed to cover for a bacterial sinus infection, and close follow-up with your PCP and your specialist at [facility] if you are having any concern for ongoing symptoms, I recommend plenty of fluids, rest.  You can also use the nausea medication I have prescribed as needed. Please return if your symptoms significantly worsen despite treatment.

## 2022-04-06 NOTE — ED Triage Notes (Signed)
Pt was exposed to her mother who was covid positive.  She has had URI since 12/24 and tested positive for covid at home after christmas.  Pt continues to feel unwell.

## 2022-04-08 ENCOUNTER — Ambulatory Visit: Payer: Medicaid Other | Admitting: Internal Medicine

## 2022-04-08 ENCOUNTER — Encounter: Payer: Self-pay | Admitting: Internal Medicine

## 2022-04-08 VITALS — BP 126/75 | HR 91 | Ht 64.5 in | Wt 137.4 lb

## 2022-04-08 DIAGNOSIS — Z720 Tobacco use: Secondary | ICD-10-CM

## 2022-04-08 DIAGNOSIS — F332 Major depressive disorder, recurrent severe without psychotic features: Secondary | ICD-10-CM

## 2022-04-08 DIAGNOSIS — Z09 Encounter for follow-up examination after completed treatment for conditions other than malignant neoplasm: Secondary | ICD-10-CM | POA: Diagnosis not present

## 2022-04-08 DIAGNOSIS — U071 COVID-19: Secondary | ICD-10-CM | POA: Diagnosis not present

## 2022-04-08 DIAGNOSIS — G629 Polyneuropathy, unspecified: Secondary | ICD-10-CM | POA: Diagnosis not present

## 2022-04-08 DIAGNOSIS — K743 Primary biliary cirrhosis: Secondary | ICD-10-CM | POA: Diagnosis not present

## 2022-04-08 DIAGNOSIS — J0141 Acute recurrent pansinusitis: Secondary | ICD-10-CM | POA: Diagnosis not present

## 2022-04-08 DIAGNOSIS — J4489 Other specified chronic obstructive pulmonary disease: Secondary | ICD-10-CM

## 2022-04-08 DIAGNOSIS — Z2821 Immunization not carried out because of patient refusal: Secondary | ICD-10-CM | POA: Diagnosis not present

## 2022-04-08 LAB — PATHOLOGIST SMEAR REVIEW: Path Review: REACTIVE

## 2022-04-08 MED ORDER — PROMETHAZINE-DM 6.25-15 MG/5ML PO SYRP: 5.0000 mL | ORAL_SOLUTION | Freq: Four times a day (QID) | ORAL | 0 refills | Status: DC | PRN

## 2022-04-08 MED ORDER — METHYLPREDNISOLONE 4 MG PO TBPK: ORAL_TABLET | ORAL | 0 refills | Status: DC

## 2022-04-08 MED ORDER — NOREL AD 4-10-325 MG PO TABS: 1.0000 | ORAL_TABLET | Freq: Three times a day (TID) | ORAL | 0 refills | Status: DC | PRN

## 2022-04-08 NOTE — Assessment & Plan Note (Signed)
Currently has lingering sinus symptoms from recent viral infection Continue doxycycline for secondary bacterial Ppx Added Medrol dose pack for post COVID cough and fatigue Norel AD as needed for nasal congestion Promethazine DM syrup as needed for cough

## 2022-04-08 NOTE — Progress Notes (Signed)
Established Patient Office Visit  Subjective:  Patient ID: Kara Stewart, female    DOB: [birth year]  Age: 47 y.o. MRN: [id number]  CC:  Chief Complaint  Patient presents with   Covid Positive    Patient tested positive on christmas. Patient went to urgent care on Monday for sinus infection, still positive for covid. Patient symptoms currently headache, earache, congestion and cough     HPI Kara Stewart is a 47 y.o. female with past medical history of PBC, macrocytic anemia, recurrent falls, splenic laceration s/p splenectomy and portal vein thrombosis who presents for recent  COVID infection.  She recently went to ER on 04/06/22 for nasal congestion, cough and sinus pain.  She was found to be COVID positive.  Since she had symptoms for about a week, she was given doxycycline for acute sinusitis.  She is still complains of fatigue and myalgias.  Denies any fever or chills.  Denies any dyspnea or wheezing currently.  Of note, she has history of asthmatic bronchitis. She still smokes about 2-3 cigarettes per day.  She follows up with GI for PBC and history of portal vein thrombosis.  She is on ursodiol currently.  She also sees ophthalmologist for retinal edema and takes CellCept.  She continues to have chronic right leg pain. She has chronic numbness and tingling of the feet.  She takes Lyrica as needed for leg pain.  She was referred to [facility] therapy for her PTSD/depression.  She has been able to see psychiatrist.  She denies any SI or HI currently.  She currently takes Effexor, Abilify and Vistaril for depression with anxiety.   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 Treating with URSO due to elevated AMA. LFTs slowly improving. Concern for recurrent  non-tense ascites on exam: patient feels uncomfortable and requesting paracentesis. This is likely   Asthma due to seasonal allergies 04/17/2020   Auditory perceptual disorder    Back pain    Chronic liver disease 11/16/2017   Deep vein thrombosis of portal vein 03/06/2020   Dysphagia 05/29/2020   Fibromyalgia    Generalized anxiety disorder with panic attacks    GERD (gastroesophageal reflux disease) 07/21/2012   Hiatal hernia    History of alcohol abuse 04/26/2015   History of colitis 11/29/2014   History of marijuana use 01/20/2015   Hypertriglyceridemia    Hypothyroid    Iritis    frequent   Iron deficiency anemia 05/13/2020   Macrocytic anemia 03/06/2020   Major depressive disorder 09/10/2020   Ovarian cyst    Polyarthralgia 03/06/2020   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   PTSD (post-traumatic stress disorder) 09/10/2020   S/P closed fracture of wrist 03/06/2020   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   SVT (supraventricular tachycardia)    last issue 12/2017   Thyroid nodule 01/11/2014    Past Surgical History:  Procedure Laterality Date   BIOPSY N/A 02/05/2015   Procedure: BIOPSY;  Surgeon: Danie Binder, MD;  Location: [facility];  Service: Endoscopy;  Laterality: N/A;   BIOPSY STOMACH  08/2021   BREAST BIOPSY Left 2023   COLONOSCOPY     COLONOSCOPY WITH PROPOFOL N/A 02/05/2015   HTD:SKAJG HH/mild diverticulosis   ESOPHAGEAL DILATION N/A 11/21/2015   Procedure: ESOPHAGEAL DILATION;  Surgeon: Daneil Dolin, MD;  Location: [facility];  Service: Endoscopy;  Laterality: N/A;   ESOPHAGOGASTRODUODENOSCOPY  11/06/2011   [name]: MILD Esophagitis/PATENT ESOPHAGEAL Stricture/  Moderate gastritis. Bx no.hpylori or celiac, +gastritis   ESOPHAGOGASTRODUODENOSCOPY (EGD) WITH PROPOFOL N/A 03/20/2014   [name]: 1. Mild esophagitis & distal esophagela stricture. 2. small hiatal hernia 3. moderate non-erosive gastritis and mild duodenits   ESOPHAGOGASTRODUODENOSCOPY (EGD) WITH PROPOFOL N/A  11/21/2015   Dr. Gala Romney: LA grade B esophagitis, MW tear likely source of hematemesis   LAPAROSCOPIC TUBAL LIGATION Bilateral 09/14/2018   Procedure: LAPAROSCOPIC TUBAL LIGATION;  Surgeon: Emily Filbert, MD;  Location: [place];  Service: Gynecology;  Laterality: Bilateral;   LIVER BIOPSY  02/23/2020   Procedure: OPEN LIVER BIOPSY;  Surgeon: Dwan Bolt, MD;  Location: [place];  Service: General;;   SAVORY DILATION N/A 03/20/2014   Procedure: SAVORY DILATION;  Surgeon: Danie Binder, MD;  Location: [facility];  Service: Endoscopy;  Laterality: N/A;  dilated with # 12.8, 14,15,16   SPLENECTOMY, TOTAL N/A 02/23/2020   Procedure: SPLENECTOMY;  Surgeon: Dwan Bolt, MD;  Location: [place];  Service: General;  Laterality: N/A;   TOOTH EXTRACTION      Family History  Problem Relation Age of Onset   Breast cancer Mother    Anxiety disorder Mother    Hypertension Mother    Other Mother        fatty liver   Hyperlipidemia Mother    Liver disease Mother        fatty liver, does not drink.    Sleep apnea Mother    Other Father        varicose veins; stomach issues; hernia   Hypertension Father    Hyperlipidemia Father    Cancer Father        prostate   Arthritis Father        rheumatoid   Neuropathy Father    Rheum arthritis Father    Thyroid disease Sister    Hypertension Sister    Healthy Daughter    Breast cancer Maternal Grandmother    Cancer Maternal Grandmother        skin   Anxiety disorder Maternal Grandmother    Dementia Maternal Grandmother    Diabetes Maternal Grandfather    Heart disease Maternal Grandfather    Dementia Maternal Grandfather    Other Paternal Grandmother        hernia   COPD Paternal Grandmother    Diabetes Paternal Grandmother    Stomach cancer Paternal Grandfather        colon cancer   Cancer Paternal Grandfather        throat and esophagus   Other Brother        hernia    Social History   Socioeconomic History   Marital  status: Divorced    Spouse name: Not on file   Number of children: 1   Years of education: 16   Highest education level: Some college, no degree  Occupational History   Occupation: Unemployed  Tobacco Use   Smoking status: Every Day    Packs/day: 0.25    Years: 2.00    Total pack years: 0.50    Types: Cigarettes    Start date: 12/22/2017    Passive exposure: Never   Smokeless tobacco: Never   Tobacco comments:    2 cigs per day  Vaping Use   Vaping Use: Never used  Substance and Sexual Activity   Alcohol use: No  Alcohol/week: 0.0 standard drinks of alcohol    Comment: Sober since 2018   Drug use: Yes    Types: Marijuana    Comment: CBD oil   Sexual activity: Yes    Birth control/protection: Surgical    Comment: tubal  Other Topics Concern   Not on file  Social History Narrative   Lives alone with a roommate. Dating and in safe relationship.     Previous MD: Deborah Chalk, NP ([place], [place])   Right handed   Drinks caffeine   One story home   Social Determinants of Health   Financial Resource Strain: High Risk (02/12/2021)   Overall Financial Resource Strain (CARDIA)    Difficulty of Paying Living Expenses: Very hard  Food Insecurity: Food Insecurity Present (02/12/2021)   Hunger Vital Sign    Worried About Running Out of Food in the Last Year: Often true    Ran Out of Food in the Last Year: Often true  Transportation Needs: No Transportation Needs (02/12/2021)   PRAPARE - Hydrologist (Medical): No    Lack of Transportation (Non-Medical): No  Physical Activity: Insufficiently Active (02/12/2021)   Exercise Vital Sign    Days of Exercise per Week: 7 days    Minutes of Exercise per Session: 10 min  Stress: Stress Concern Present (02/12/2021)   [place]    Feeling of Stress : Very much  Social Connections: Moderately Integrated (02/12/2021)   Social Connection and Isolation  Panel [NHANES]    Frequency of Communication with Friends and Family: More than three times a week    Frequency of Social Gatherings with Friends and Family: More than three times a week    Attends Religious Services: More than 4 times per year    Active Member of [employer] or Organizations: Yes    Attends Archivist Meetings: Never    Marital Status: Divorced  Human resources officer Violence: Not At Risk (02/12/2021)   Humiliation, Afraid, Rape, and Kick questionnaire    Fear of Current or Ex-Partner: No    Emotionally Abused: No    Physically Abused: No    Sexually Abused: No    Outpatient Medications Prior to Visit  Medication Sig Dispense Refill   Aripiprazole 1 MG/ML mL SMARTSIG:3 Milliliter(s) By Mouth Every Morning     cholestyramine (QUESTRAN) 4 g packet Take 1 packet by mouth 2 (two) times daily.     hydrOXYzine (ATARAX) 10 MG tablet Take 10 mg by mouth 2 (two) times daily.     venlafaxine XR (EFFEXOR-XR) 75 MG 24 hr capsule Take 225 mg by mouth daily.     ARIPiprazole (ABILIFY) 2 MG tablet Take 2 mg by mouth every morning.     cholestyramine (QUESTRAN) 4 GM/DOSE powder Take by mouth.     acetaminophen (TYLENOL) 500 MG tablet Take 1,000 mg by mouth every 6 (six) hours as needed.     albuterol (VENTOLIN HFA) 108 (90 Base) MCG/ACT inhaler INHALE 2 PUFFS INTO THE LUNGS EVERY 6 HOURS AS NEEDED FOR WHEEZING OR SHORTNESS OF BREATH 8.5 g 1   budesonide-formoterol (SYMBICORT) 80-4.5 MCG/ACT inhaler Take 2 puffs first thing in am and then another 2 puffs about 12 hours later. 1 each 11   Cholecalciferol (VITAMIN D3) 125 MCG (5000 UT) CAPS Take 1 capsule (5,000 Units total) by mouth daily. 100 capsule 3   cyanocobalamin (,VITAMIN B-12,) 1000 MCG/ML injection ADMINISTER 1 ML(1000 MCG) IN THE MUSCLE  EVERY 30 DAYS 3 mL 3   doxycycline (VIBRAMYCIN) 100 MG capsule Take 1 capsule (100 mg total) by mouth 2 (two) times daily. 14 capsule 0   ELIQUIS 5 MG TABS tablet TAKE 1 TABLET(5 MG) BY MOUTH  TWICE DAILY 60 tablet 1   FOLIC ACID PO Take by mouth.     hydrOXYzine (VISTARIL) 25 MG capsule Take 1 capsule (25 mg total) by mouth every 8 (eight) hours as needed for itching. 30 capsule 3   MILK THISTLE PO Take by mouth daily.     mycophenolate (CELLCEPT) 250 MG capsule Take by mouth.     ondansetron (ZOFRAN) 4 MG tablet Take 1 tablet (4 mg total) by mouth every 6 (six) hours. 12 tablet 0   OVER THE COUNTER MEDICATION Premeir protein drink once a day     pantoprazole (PROTONIX) 40 MG tablet Take 1 tablet (40 mg total) by mouth 2 (two) times daily before a meal. (Patient taking differently: Take 40 mg by mouth.) 180 tablet 1   prednisoLONE acetate (PRED FORTE) 1 % ophthalmic suspension Place into the left eye.     pregabalin (LYRICA) 50 MG capsule Take 1 capsule (50 mg total) by mouth 2 (two) times daily. (Patient taking differently: Take 50 mg by mouth as needed.) 60 capsule 3   UNABLE TO FIND Iron infusion-every 3 months     ursodiol (ACTIGALL) 500 MG tablet TAKE 1 TABLET BY MOUTH TWICE DAILY 60 tablet 11   valACYclovir (VALTREX) 1000 MG tablet Take 1,000 mg by mouth daily.     venlafaxine XR (EFFEXOR-XR) 150 MG 24 hr capsule SMARTSIG:1 Capsule(s) By Mouth Every Evening     buPROPion (WELLBUTRIN XL) 300 MG 24 hr tablet Take 300 mg by mouth daily.     Calcium Carb-Cholecalciferol (CALCIUM/VITAMIN D PO) Take by mouth daily.     gabapentin (NEURONTIN) 100 MG capsule Take by mouth.     metroNIDAZOLE (FLAGYL) 500 MG tablet Take 1 tablet (500 mg total) by mouth 2 (two) times daily. 14 tablet 0   sertraline (ZOLOFT) 50 MG tablet TAKE 1 TABLET(50 MG) BY MOUTH DAILY (Patient taking differently: 200 mg.) 30 tablet 3   ursodiol (ACTIGALL) 500 MG tablet TAKE 1 TABLET BY MOUTH TWICE DAILY 60 tablet 11   No facility-administered medications prior to visit.    Allergies  Allergen Reactions   Amoxicillin-Pot Clavulanate Anaphylaxis   Penicillins Anaphylaxis    Has patient had a PCN reaction causing  immediate rash, facial/tongue/throat swelling, SOB or lightheadedness with hypotension: yes Has patient had a PCN reaction causing severe rash involving mucus membranes or skin necrosis: No Has patient had a PCN reaction that required hospitalization Yes Has patient had a PCN reaction occurring within the last 10 years: No If all of the above answers are "NO", then may proceed with Cephalosporin use.    Avelox [Moxifloxacin]     Tunnel vision, seeing spots, joint pain   Lidocaine Other (See Comments)    Can use topical lidocaine but if ingested causes Hallucinations.    ROS Review of Systems  Constitutional:  Positive for fatigue. Negative for chills and fever.  HENT:  Positive for congestion, sinus pressure and sinus pain. Negative for sore throat.   Eyes:  Negative for pain and discharge.  Respiratory:  Positive for cough. Negative for shortness of breath.   Cardiovascular:  Negative for chest pain and palpitations.  Gastrointestinal:  Negative for constipation, diarrhea, nausea and vomiting.  Endocrine: Negative for polydipsia and polyuria.  Genitourinary:  Negative for dysuria and hematuria.  Musculoskeletal:  Positive for arthralgias, back pain and myalgias. Negative for neck stiffness.  Skin:  Negative for rash.  Neurological:  Positive for numbness. Negative for syncope and headaches.  Psychiatric/Behavioral:  Positive for decreased concentration, dysphoric mood and sleep disturbance. Negative for agitation and behavioral problems. The patient is nervous/anxious.       Objective:    Physical Exam Vitals reviewed.  Constitutional:      General: She is not in acute distress.    Appearance: She is not diaphoretic.  HENT:     Head: Normocephalic.     Nose: Congestion present.     Right Sinus: Maxillary sinus tenderness present.     Left Sinus: Maxillary sinus tenderness present.     Mouth/Throat:     Mouth: Mucous membranes are moist.  Eyes:     General: No scleral  icterus.    Extraocular Movements: Extraocular movements intact.     Pupils: Pupils are equal, round, and reactive to light.  Cardiovascular:     Rate and Rhythm: Normal rate and regular rhythm.     Pulses: Normal pulses.     Heart sounds: Normal heart sounds. No murmur heard. Pulmonary:     Breath sounds: Normal breath sounds. No wheezing or rales.  Abdominal:     Palpations: Abdomen is soft.     Tenderness: There is abdominal tenderness (Mild epigastric). There is no guarding or rebound.  Musculoskeletal:     Cervical back: Neck supple. No tenderness.     Right lower leg: No edema.     Left lower leg: No edema.     Comments: Tortuous veins over right LE  Skin:    General: Skin is warm.  Neurological:     General: No focal deficit present.     Mental Status: She is alert and oriented to person, place, and time.  Psychiatric:        Mood and Affect: Mood is depressed. Affect is tearful.        Behavior: Behavior is cooperative.        Thought Content: Thought content normal. Thought content does not include suicidal ideation.     BP 126/75 (BP Location: Right Arm, Patient Position: Sitting, Cuff Size: Normal)   Pulse 91   Ht 5' 4.5" (1.638 m)   Wt 137 lb 6.4 oz (62.3 kg)   SpO2 97%   BMI 23.22 kg/m  Wt Readings from Last 3 Encounters:  04/08/22 137 lb 6.4 oz (62.3 kg)  10/29/21 140 lb (63.5 kg)  10/17/21 136 lb (61.7 kg)    Lab Results  Component Value Date   TSH 0.882 01/22/2021   Lab Results  Component Value Date   WBC 20.7 (H) 04/06/2022   HGB 13.8 04/06/2022   HCT 41.2 04/06/2022   MCV 90.5 04/06/2022   PLT 420 (H) 04/06/2022   Lab Results  Component Value Date   NA 139 04/06/2022   K 3.0 (L) 04/06/2022   CO2 28 04/06/2022   GLUCOSE 94 04/06/2022   BUN 9 04/06/2022   CREATININE 0.57 04/06/2022   BILITOT 0.5 04/06/2022   ALKPHOS 81 04/06/2022   AST 16 04/06/2022   ALT 15 04/06/2022   PROT 7.8 04/06/2022   ALBUMIN 4.3 04/06/2022   CALCIUM 9.4  04/06/2022   ANIONGAP 9 04/06/2022   EGFR 100 05/02/2021   Lab Results  Component Value Date   CHOL 166 01/27/2017   Lab Results  Component  Value Date   HDL 45 (L) 01/27/2017   Lab Results  Component Value Date   LDLCALC 101 (H) 01/27/2017   Lab Results  Component Value Date   TRIG 103 01/27/2017   Lab Results  Component Value Date   CHOLHDL 3.7 01/27/2017   No results found for: "HGBA1C"    Assessment & Plan:   Problem List Items Addressed This Visit       Respiratory   Asthmatic bronchitis , chronic    Overall well controlled with Symbicort and as needed albuterol Added Medrol dose pack for post COVID cough and fatigue Norel AD as needed for nasal congestion Promethazine DM syrup as needed for cough      Relevant Medications   methylPREDNISolone (MEDROL DOSEPAK) 4 MG TBPK tablet   promethazine-dextromethorphan (PROMETHAZINE-DM) 6.25-15 MG/5ML syrup   Acute recurrent pansinusitis - Primary    Currently has lingering sinus symptoms from recent viral infection Continue doxycycline for secondary bacterial Ppx Added Medrol dose pack for post COVID cough and fatigue Norel AD as needed for nasal congestion Promethazine DM syrup as needed for cough       Relevant Medications   methylPREDNISolone (MEDROL DOSEPAK) 4 MG TBPK tablet   Chlorphen-PE-Acetaminophen (NOREL AD) 4-10-325 MG TABS   promethazine-dextromethorphan (PROMETHAZINE-DM) 6.25-15 MG/5ML syrup     Digestive   Primary biliary cirrhosis    On Ursodiol Follows up with GI On vitamin supplements Vistaril as needed for itching for now        Nervous and Auditory   Neuropathy    Has chronic numbness, tingling and burning pain in back and LE Has mood symptoms with gabapentin Switched to Lyrica Advised to follow-up with neurology        Other   Major depressive disorder (Chronic)    Uncontrolled, on Effexor, Abilify and as needed Vistaril Sees [facility] therapist Followed by [facility] in  [place]      Relevant Medications   hydrOXYzine (ATARAX) 10 MG tablet   venlafaxine XR (EFFEXOR-XR) 75 MG 24 hr capsule   venlafaxine XR (EFFEXOR-XR) 150 MG 24 hr capsule   Tobacco abuse    Smokes about 3 cigarettes per day  Asked about quitting: confirms they are currently smokes cigarettes Advise to quit smoking: Educated about QUITTING to reduce the risk of cancer, cardio and cerebrovascular disease. Assess willingness: Unwilling to quit at this time, but is working on cutting back. Assist with counseling and pharmacotherapy: Counseled for 5 minutes and literature provided. Arrange for follow up: (if quitting, follow up 1 week after last cigarette, congratulate at each visit post quitting) If not quitting follow up in 3 months and continue to offer help.      Refused influenza vaccine   Encounter for examination following treatment at hospital    ER chart reviewed COVID-positive, but outside of treatment window period Was given doxycycline for secondary bacterial PPx, continue for now Added Medrol Dosepak today for post COVID fatigue and cough      Other Visit Diagnoses     COVID-19       Relevant Medications   methylPREDNISolone (MEDROL DOSEPAK) 4 MG TBPK tablet   Chlorphen-PE-Acetaminophen (NOREL AD) 4-10-325 MG TABS   promethazine-dextromethorphan (PROMETHAZINE-DM) 6.25-15 MG/5ML syrup       Meds ordered this encounter  Medications   methylPREDNISolone (MEDROL DOSEPAK) 4 MG TBPK tablet    Sig: Take as package instructions.    Dispense:  1 each    Refill:  0   Chlorphen-PE-Acetaminophen (NOREL AD)  4-10-325 MG TABS    Sig: Take 1 tablet by mouth 3 (three) times daily as needed (Nasal congestion).    Dispense:  30 tablet    Refill:  0   promethazine-dextromethorphan (PROMETHAZINE-DM) 6.25-15 MG/5ML syrup    Sig: Take 5 mLs by mouth 4 (four) times daily as needed for cough.    Dispense:  118 mL    Refill:  0    Follow-up: Return in about 4 months (around 08/07/2022)  for Annual physical.    Lindell Spar, MD

## 2022-04-08 NOTE — Assessment & Plan Note (Signed)
On Ursodiol Follows up with GI On vitamin supplements Vistaril as needed for itching for now

## 2022-04-08 NOTE — Assessment & Plan Note (Addendum)
Uncontrolled, on Effexor, Abilify and as needed Vistaril Sees [facility] therapist Followed by [facility] in [place]

## 2022-04-08 NOTE — Assessment & Plan Note (Signed)
ER chart reviewed COVID-positive, but outside of treatment window period Was given doxycycline for secondary bacterial PPx, continue for now Added Medrol Dosepak today for post COVID fatigue and cough

## 2022-04-08 NOTE — Assessment & Plan Note (Signed)
Overall well controlled with Symbicort and as needed albuterol Added Medrol dose pack for post COVID cough and fatigue Norel AD as needed for nasal congestion Promethazine DM syrup as needed for cough

## 2022-04-08 NOTE — Assessment & Plan Note (Signed)
Has chronic numbness, tingling and burning pain in back and LE Has mood symptoms with gabapentin Switched to Lyrica Advised to follow-up with neurology

## 2022-04-08 NOTE — Patient Instructions (Signed)
Please start taking Prednisone as prescribed.  Please continue taking other medications as prescribed.  Okay to take Norel AD as needed for nasal congestion.

## 2022-04-08 NOTE — Assessment & Plan Note (Signed)
Smokes about 3 cigarettes per day  Asked about quitting: confirms they are currently smokes cigarettes Advise to quit smoking: Educated about QUITTING to reduce the risk of cancer, cardio and cerebrovascular disease. Assess willingness: Unwilling to quit at this time, but is working on cutting back. Assist with counseling and pharmacotherapy: Counseled for 5 minutes and literature provided. Arrange for follow up: (if quitting, follow up 1 week after last cigarette, congratulate at each visit post quitting) If not quitting follow up in 3 months and continue to offer help.

## 2022-04-12 ENCOUNTER — Other Ambulatory Visit: Payer: Self-pay | Admitting: Internal Medicine

## 2022-04-20 ENCOUNTER — Other Ambulatory Visit: Payer: Self-pay | Admitting: Internal Medicine

## 2022-04-20 DIAGNOSIS — J4489 Other specified chronic obstructive pulmonary disease: Secondary | ICD-10-CM

## 2022-04-20 MED ORDER — BUDESONIDE-FORMOTEROL FUMARATE 80-4.5 MCG/ACT IN AERO: INHALATION_SPRAY | RESPIRATORY_TRACT | 11 refills | Status: DC

## 2022-04-28 ENCOUNTER — Encounter: Payer: Self-pay | Admitting: Internal Medicine

## 2022-04-28 ENCOUNTER — Ambulatory Visit (HOSPITAL_COMMUNITY)
Admission: RE | Admit: 2022-04-28 | Discharge: 2022-04-28 | Disposition: A | Discharge status: Home / Self Care | Payer: Medicare Other | Source: Ambulatory Visit | Attending: Internal Medicine | Admitting: Internal Medicine

## 2022-04-28 ENCOUNTER — Ambulatory Visit: Payer: Medicaid Other | Admitting: Internal Medicine

## 2022-04-28 VITALS — BP 112/74 | HR 88 | Ht 64.0 in | Wt 141.0 lb

## 2022-04-28 DIAGNOSIS — J4489 Other specified chronic obstructive pulmonary disease: Secondary | ICD-10-CM | POA: Insufficient documentation

## 2022-04-28 MED ORDER — PREDNISONE 20 MG PO TABS: 40.0000 mg | ORAL_TABLET | Freq: Every day | ORAL | 0 refills | Status: AC

## 2022-04-28 MED ORDER — LEVOFLOXACIN 500 MG PO TABS: 500.0000 mg | ORAL_TABLET | Freq: Every day | ORAL | 0 refills | Status: AC

## 2022-04-28 NOTE — Assessment & Plan Note (Signed)
Presenting today for evaluation of the symptoms endorsed above.  She initially tested positive for COVID-19 shortly after Christmas and was then treated with doxycycline beginning 1/1 due to concern for superimposed bacterial infection.  Her symptoms transiently improved, however returned 1 week ago.  She is currently experiencing a cough productive of yellow sputum, sinus congestion with yellow nasal secretions, shortness of breath, and subjective fever/chills. -I have prescribed Levaquin x 7 days as well as prednisone 40 mg x 5 days -Chest x-ray ordered today given persistence of her symptoms -She has previously scheduled follow-up with Dr. Posey Pronto in May but was instructed to return to care if her symptoms worsen or fail to improve

## 2022-04-28 NOTE — Progress Notes (Signed)
Acute Office Visit  Subjective:     Patient ID: Kara Stewart, female    DOB: 06/04/1975, 47 y.o.   MRN: [id number]  Chief Complaint  Patient presents with   URI    Patient saw Posey Pronto on 04/20/2022, still not better. Yellow snot, congestion, sore throat, hoarseness, shortness of breath, coughing   Kara Stewart presents today for an acute visit to discuss persistent URI symptoms. She tested positive for COVID-19 shortly after Christmas. She then presented to urgent care on 04/06/22 for persistent URI symptoms. She was prescribed doxycycline at that time. Seen at [facility] on 04/08/22 by Dr. Posey Pronto and prescribed a medrol dosepak as well as cough medication for symptom relief. Kara Stewart reports that her symptoms gradually improved until about 1 week ago.  At that time she began to experience increasing shortness of breath, chest congestion, cough intermittently productive of yellow sputum, and sinus congestion with yellow nasal secretions.  She has needed to use her albuterol inhaler twice daily.  Her daughter has also been sick.  She endorses subjective fever/chills.  Review of Systems  Constitutional:  Positive for chills and fever.  HENT:  Positive for congestion. Negative for sore throat.   Respiratory:  Positive for cough, sputum production, shortness of breath and wheezing.   Cardiovascular:  Negative for chest pain.  All other systems reviewed and are negative.     Objective:    BP 112/74 (BP Location: Left Arm, Patient Position: Sitting, Cuff Size: Normal)   Pulse 88   Ht 5\' 4"  (1.626 m)   Wt 141 lb (64 kg)   SpO2 99%   BMI 24.20 kg/m   Physical Exam Vitals reviewed.  Constitutional:      Appearance: Normal appearance.  HENT:     Head: Normocephalic and atraumatic.     Nose: Congestion and rhinorrhea present.     Mouth/Throat:     Pharynx: No oropharyngeal exudate or posterior oropharyngeal erythema.  Eyes:     Extraocular Movements: Extraocular movements intact.     Pupils: Pupils  are equal, round, and reactive to light.  Cardiovascular:     Rate and Rhythm: Normal rate and regular rhythm.     Pulses: Normal pulses.     Heart sounds: Normal heart sounds.  Pulmonary:     Breath sounds: Wheezing present.     Comments: Coughs with deep inspiration Skin:    General: Skin is warm and dry.     Capillary Refill: Capillary refill takes less than 2 seconds.     Coloration: Skin is not jaundiced.  Neurological:     General: No focal deficit present.     Mental Status: She is alert and oriented to person, place, and time.       Assessment & Plan:   Problem List Items Addressed This Visit       Asthmatic bronchitis , chronic - Primary    Presenting today for evaluation of the symptoms endorsed above.  She initially tested positive for COVID-19 shortly after Christmas and was then treated with doxycycline beginning 1/1 due to concern for superimposed bacterial infection.  Her symptoms transiently improved, however returned 1 week ago.  She is currently experiencing a cough productive of yellow sputum, sinus congestion with yellow nasal secretions, shortness of breath, and subjective fever/chills. -I have prescribed Levaquin x 7 days as well as prednisone 40 mg x 5 days -Chest x-ray ordered today given persistence of her symptoms -She has previously scheduled follow-up with Dr. Posey Pronto in  May but was instructed to return to care if her symptoms worsen or fail to improve       Meds ordered this encounter  Medications   levofloxacin (LEVAQUIN) 500 MG tablet    Sig: Take 1 tablet (500 mg total) by mouth daily for 7 days.    Dispense:  7 tablet    Refill:  0   predniSONE (DELTASONE) 20 MG tablet    Sig: Take 2 tablets (40 mg total) by mouth daily for 5 days.    Dispense:  10 tablet    Refill:  0    Return if symptoms worsen or fail to improve.  Johnette Abraham, MD

## 2022-04-28 NOTE — Patient Instructions (Signed)
It was a pleasure to see you today.  Thank you for giving [place] the opportunity to be involved in your care.  Below is a brief recap of your visit and next steps.  We will plan to see you again in May  Summary I have prescribed Levaquin and prednisone for treatment of your current symptoms I have also ordered a chest xray Please return to care if your symptoms worse or fail to improve. Otherwise we will plan to see you for follow up in May.

## 2022-05-01 ENCOUNTER — Ambulatory Visit (INDEPENDENT_AMBULATORY_CARE_PROVIDER_SITE_OTHER): Payer: Medicaid Other | Admitting: *Deleted

## 2022-05-01 ENCOUNTER — Other Ambulatory Visit (HOSPITAL_COMMUNITY)
Admission: RE | Admit: 2022-05-01 | Discharge: 2022-05-01 | Disposition: A | Discharge status: Home / Self Care | Payer: Medicaid Other | Source: Ambulatory Visit | Attending: Obstetrics & Gynecology | Admitting: Obstetrics & Gynecology

## 2022-05-01 VITALS — BP 100/58 | HR 75 | Ht 64.0 in | Wt 141.0 lb

## 2022-05-01 DIAGNOSIS — N898 Other specified noninflammatory disorders of vagina: Secondary | ICD-10-CM

## 2022-05-01 DIAGNOSIS — Z113 Encounter for screening for infections with a predominantly sexual mode of transmission: Secondary | ICD-10-CM | POA: Diagnosis present

## 2022-05-01 NOTE — Progress Notes (Signed)
SUBJECTIVE:  47 y.o. female complains of red, orange, odorless, and thick vaginal discharge for  1-2 day(s) with pelvic pain Denies abnormal vaginal bleeding and fever. No UTI symptoms. Denies history of known exposure to STD.  No LMP recorded.  OBJECTIVE:  She appears well, afebrile. Urine dipstick: not done.  ASSESSMENT:  Vaginal Discharge  Screening for STDs   PLAN:  GC, chlamydia, trichomonas, BVAG, CVAG probe sent to lab. Treatment: To be determined once lab results are received Pt advised to follow up with Derrek Monaco, NP for a problem visit as soon as possible.

## 2022-05-04 ENCOUNTER — Ambulatory Visit (INDEPENDENT_AMBULATORY_CARE_PROVIDER_SITE_OTHER): Payer: Medicaid Other | Admitting: Adult Health

## 2022-05-04 ENCOUNTER — Encounter: Payer: Self-pay | Admitting: Adult Health

## 2022-05-04 VITALS — BP 94/64 | HR 80 | Ht 64.0 in | Wt 139.0 lb

## 2022-05-04 DIAGNOSIS — N898 Other specified noninflammatory disorders of vagina: Secondary | ICD-10-CM

## 2022-05-04 DIAGNOSIS — N921 Excessive and frequent menstruation with irregular cycle: Secondary | ICD-10-CM

## 2022-05-04 DIAGNOSIS — N951 Menopausal and female climacteric states: Secondary | ICD-10-CM

## 2022-05-04 DIAGNOSIS — R102 Pelvic and perineal pain: Secondary | ICD-10-CM | POA: Diagnosis not present

## 2022-05-04 DIAGNOSIS — R350 Frequency of micturition: Secondary | ICD-10-CM

## 2022-05-04 LAB — CERVICOVAGINAL ANCILLARY ONLY
Bacterial Vaginitis (gardnerella): NEGATIVE
Candida Glabrata: NEGATIVE
Candida Vaginitis: POSITIVE — AB
Chlamydia: NEGATIVE
Comment: NEGATIVE
Comment: NEGATIVE
Comment: NEGATIVE
Comment: NEGATIVE
Comment: NEGATIVE
Comment: NORMAL
Neisseria Gonorrhea: NEGATIVE
Trichomonas: NEGATIVE

## 2022-05-04 LAB — POCT URINALYSIS DIPSTICK
Glucose, UA: NEGATIVE
Ketones, UA: NEGATIVE
Leukocytes, UA: NEGATIVE
Nitrite, UA: NEGATIVE
Protein, UA: NEGATIVE

## 2022-05-04 NOTE — Progress Notes (Signed)
  Subjective:     Patient ID: Kara Stewart, female   DOB: 12-Jun-1975, 47 y.o.   MRN: [id number]  HPI Kara Stewart is a 47 year old white female, divorced, [id number] in complaining of pelvic pain, urinary frequency and vaginal discharge, that was usual color, had self swab Friday. She says periods heavy, changes every hour sometimes. Denies any vaginal odor. She says not good since having COVID in December. On antibiotics again, ?sinus infection.  Last pap was negative HPV NILM 02/12/21  PCP is Dr Posey Pronto  Review of Systems +pelvic pain +urinary frequency +vaginal discharge +heavy periods Had skipped a few, but not a year   Reviewed past medical,surgical, social and family history. Reviewed medications and allergies.  Objective:   Physical Exam BP 94/64 (BP Location: Right Arm, Patient Position: Sitting, Cuff Size: Normal)   Pulse 80   Ht 5\' 4"  (1.626 m)   Wt 139 lb (63 kg)   LMP 05/04/2022 (Exact Date) Comment: PMP 04/08/22  BMI 23.86 kg/m  urine dipstick +blood. Skin warm and dry.Pelvic: external genitalia is normal in appearance no lesions,no lymph nodes felt in groin area, vagina: + period blood without odor,urethra has no lesions or masses noted, cervix:smooth and bulbous, uterus: normal size, shape and contour, mildly tender, no masses felt, adnexa: no masses or tenderness noted. Bladder is  tender and no masses felt.   Examination chaperoned by Levy Pupa LPN  Fall risk is high  [facility] - 05/04/22 1107       Pregnancy Intention Screening   Does the patient want to become pregnant in the next year? No    Does the patient's partner want to become pregnant in the next year? No    Would the patient like to discuss contraceptive options today? No      Contraception Wrap Up   Current Method Female Sterilization    End Method Female Sterilization    Contraception Counseling Provided No             Assessment:     1. Urinary frequency She says pees a lot since having COVID   Will get UA C&S to rule out UTi - POCT Urinalysis Dipstick - Urine Culture - Urinalysis, Routine w reflex microscopic  2. Vaginal discharge Had CV swab done Friday, results pending   3. Peri-menopause  4. Pelvic pain +pain, will get [place] to assess uterus and ovaries, 05/11/22 at [facility] 05/11/22 at d10:30 am - US PELVIC COMPLETE WITH TRANSVAGINAL; Future  5. Menorrhagia with irregular cycle Periods heavy, will change every hour at times Will get pelvic US 05/11/22 a 10:30 am at [facility] - US PELVIC COMPLETE WITH TRANSVAGINAL; Future     Plan:     Return 05/27/22 for physical, already scheduled

## 2022-05-05 LAB — URINALYSIS, ROUTINE W REFLEX MICROSCOPIC
Bilirubin, UA: NEGATIVE
Glucose, UA: NEGATIVE
Ketones, UA: NEGATIVE
Leukocytes,UA: NEGATIVE
Nitrite, UA: NEGATIVE
Protein,UA: NEGATIVE
RBC, UA: NEGATIVE
Specific Gravity, UA: 1.006 (ref 1.005–1.030)
Urobilinogen, Ur: 0.2 mg/dL (ref 0.2–1.0)
pH, UA: 6.5 (ref 5.0–7.5)

## 2022-05-06 ENCOUNTER — Other Ambulatory Visit (HOSPITAL_COMMUNITY): Payer: Self-pay | Admitting: Adult Health

## 2022-05-06 DIAGNOSIS — R921 Mammographic calcification found on diagnostic imaging of breast: Secondary | ICD-10-CM

## 2022-05-07 LAB — URINE CULTURE

## 2022-05-08 ENCOUNTER — Telehealth: Payer: Self-pay | Admitting: Internal Medicine

## 2022-05-08 NOTE — Telephone Encounter (Signed)
Patient is no better asked what does she need to do. Chest is killing her on the right side when cough and breath.  Pharmacy: [facility]

## 2022-05-11 ENCOUNTER — Ambulatory Visit (HOSPITAL_COMMUNITY)
Admission: RE | Admit: 2022-05-11 | Discharge: 2022-05-11 | Disposition: A | Discharge status: Home / Self Care | Payer: Medicaid Other | Source: Ambulatory Visit | Attending: Adult Health | Admitting: Adult Health

## 2022-05-11 ENCOUNTER — Other Ambulatory Visit: Payer: Self-pay | Admitting: Internal Medicine

## 2022-05-11 DIAGNOSIS — N921 Excessive and frequent menstruation with irregular cycle: Secondary | ICD-10-CM | POA: Insufficient documentation

## 2022-05-11 DIAGNOSIS — R102 Pelvic and perineal pain: Secondary | ICD-10-CM | POA: Diagnosis present

## 2022-05-11 DIAGNOSIS — J4489 Other specified chronic obstructive pulmonary disease: Secondary | ICD-10-CM

## 2022-05-11 MED ORDER — METHYLPREDNISOLONE 4 MG PO TBPK: ORAL_TABLET | ORAL | 0 refills | Status: DC

## 2022-05-11 NOTE — Telephone Encounter (Signed)
Patient aware.

## 2022-05-14 ENCOUNTER — Encounter: Payer: Self-pay | Admitting: Adult Health

## 2022-05-14 ENCOUNTER — Ambulatory Visit: Payer: Medicaid Other | Admitting: Adult Health

## 2022-05-14 ENCOUNTER — Other Ambulatory Visit: Payer: Self-pay

## 2022-05-14 VITALS — BP 103/68 | HR 81 | Ht 64.0 in | Wt 139.0 lb

## 2022-05-14 DIAGNOSIS — N644 Mastodynia: Secondary | ICD-10-CM

## 2022-05-14 DIAGNOSIS — I8390 Asymptomatic varicose veins of unspecified lower extremity: Secondary | ICD-10-CM

## 2022-05-14 MED ORDER — VITAMIN B-6 25 MG PO TABS: ORAL_TABLET | ORAL | Status: DC

## 2022-05-14 NOTE — Progress Notes (Addendum)
  Subjective:     Patient ID: Kara Stewart, female   DOB: 17-Jul-1975, 47 y.o.   MRN: [id number]  HPI Kara Stewart is a 47 year old white female, divorced, [id number], in complaining of pain right breast, she has diagnostic mammogram and [place] scheduled for 05/26/22. Pain extends under arm too. She says has discharge [place] bumps, and they were swollen.     Component Value Date/Time   DIAGPAP  02/12/2021 1024    - Negative for intraepithelial lesion or malignancy (NILM)   HPVHIGH Negative 02/12/2021 1024   ADEQPAP  02/12/2021 1024    Satisfactory for evaluation; transformation zone component PRESENT.   PCP is Dr Posey Pronto.  Review of Systems +pain right breast Reviewed past medical,surgical, social and family history. Reviewed medications and allergies.     Objective:   Physical Exam BP 103/68 (BP Location: Left Arm, Patient Position: Sitting, Cuff Size: Normal)   Pulse 81   Ht 5\' 4"  (1.626 m)   Wt 139 lb (63 kg)   LMP 05/04/2022 (Exact Date) Comment: PMP 04/08/22  BMI 23.86 kg/m      Skin warm and dry,  Breasts:no dominate palpable mass, retraction or nipple discharge +pain UOQ and right breast, has regular irregularities both breasts.  Fall risk is high  [facility] - 05/14/22 1116       Pregnancy Intention Screening   Does the patient want to become pregnant in the next year? No    Does the patient's partner want to become pregnant in the next year? No    Would the patient like to discuss contraceptive options today? No      Contraception Wrap Up   Current Method Female Sterilization    End Method Female Sterilization    Contraception Counseling Provided No              Assessment:     1. Breast pain, right Decrease caffeine Try B6  25 mg 1 tid Keep mammogram appt 05/26/22    Plan:     Follow up as scheduled for physical

## 2022-05-19 ENCOUNTER — Telehealth (HOSPITAL_COMMUNITY): Payer: Self-pay | Admitting: Psychiatry

## 2022-05-19 ENCOUNTER — Ambulatory Visit (HOSPITAL_COMMUNITY): Payer: Medicaid Other | Admitting: Psychiatry

## 2022-05-19 NOTE — Telephone Encounter (Signed)
Therapist attempted to contact patient via text through caregility platform for scheduled appointment, no response.  Therapist called patient and received voicemail recording indicating voicemail box is full

## 2022-05-21 ENCOUNTER — Ambulatory Visit (HOSPITAL_COMMUNITY)
Admission: RE | Admit: 2022-05-21 | Discharge: 2022-05-21 | Disposition: A | Discharge status: Home / Self Care | Payer: Medicare Other | Source: Ambulatory Visit | Attending: Vascular Surgery | Admitting: Vascular Surgery

## 2022-05-21 DIAGNOSIS — I8390 Asymptomatic varicose veins of unspecified lower extremity: Secondary | ICD-10-CM | POA: Insufficient documentation

## 2022-05-21 DIAGNOSIS — M79604 Pain in right leg: Secondary | ICD-10-CM

## 2022-05-26 ENCOUNTER — Ambulatory Visit (HOSPITAL_COMMUNITY)
Admission: RE | Admit: 2022-05-26 | Discharge: 2022-05-26 | Disposition: A | Discharge status: Home / Self Care | Payer: Medicaid Other | Source: Ambulatory Visit | Attending: Adult Health | Admitting: Adult Health

## 2022-05-26 ENCOUNTER — Ambulatory Visit (HOSPITAL_COMMUNITY)
Admission: RE | Admit: 2022-05-26 | Discharge: 2022-05-26 | Disposition: A | Discharge status: Home / Self Care | Payer: Medicare Other | Source: Ambulatory Visit | Attending: Adult Health | Admitting: Adult Health

## 2022-05-26 DIAGNOSIS — R921 Mammographic calcification found on diagnostic imaging of breast: Secondary | ICD-10-CM

## 2022-05-27 ENCOUNTER — Ambulatory Visit: Payer: Medicaid Other | Admitting: Adult Health

## 2022-06-02 ENCOUNTER — Ambulatory Visit (INDEPENDENT_AMBULATORY_CARE_PROVIDER_SITE_OTHER): Payer: Medicare Other | Admitting: Psychiatry

## 2022-06-02 DIAGNOSIS — F321 Major depressive disorder, single episode, moderate: Secondary | ICD-10-CM

## 2022-06-02 DIAGNOSIS — F4310 Post-traumatic stress disorder, unspecified: Secondary | ICD-10-CM | POA: Diagnosis not present
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
## 2022-06-04 ENCOUNTER — Encounter: Payer: Self-pay | Admitting: Radiology

## 2022-06-10 ENCOUNTER — Ambulatory Visit: Payer: Medicaid Other | Admitting: Vascular Surgery

## 2022-06-10 ENCOUNTER — Encounter: Payer: Self-pay | Admitting: Vascular Surgery

## 2022-06-10 VITALS — BP 107/64 | HR 72 | Temp 98.7°F | Resp 18 | Ht 64.0 in | Wt 142.2 lb

## 2022-06-10 DIAGNOSIS — I872 Venous insufficiency (chronic) (peripheral): Secondary | ICD-10-CM | POA: Diagnosis not present

## 2022-06-10 DIAGNOSIS — I8390 Asymptomatic varicose veins of unspecified lower extremity: Secondary | ICD-10-CM | POA: Diagnosis not present

## 2022-06-10 NOTE — Progress Notes (Signed)
REASON FOR VISIT:   Follow-up of chronic venous insufficiency.  MEDICAL ISSUES:   CHRONIC VENOUS INSUFFICIENCY: This patient does have some deep venous reflux on the right.  I think some of her symptoms can be attributed to symptoms from venous hypertension.  She describes aching pain and heaviness in the leg which is aggravated by sitting and standing and relieved with elevation and compression.  However, I think some of her pain could potentially be neurogenic.  She has pain down the posterior aspect of her leg and has had back issues in the past.  With respect to her venous disease we have discussed the importance of daily leg elevation and the proper positioning for this.  We fitted her for some knee-high compression stockings with a gradient of 15 to 20 mmHg.  I encouraged her to avoid prolonged sitting and standing.  We discussed importance of exercise.  She does have some reticular veins along the lateral aspect of her right thigh and I think she would be a candidate for sclerotherapy if she wished to address these.  I explained that these are considered cosmetic and not covered by insurance.  I have given her Estell Harpin, RN's card if she wishes to discuss this further.  I will see her back as needed.  HPI:   Kara Stewart is a pleasant 47 y.o. female who had previously been seen back in July 2022 by Dr. Carlis Abbott with chronic venous insufficiency.  At that time her main complaint was in the right leg at the site of a varicosity in the right posterior lateral leg.  At that time she did not have significant superficial venous reflux.  She was recommended to start wearing compression stockings and elevate her leg.  She was then lost to follow-up.  On my history, the patient's main complaint is pain in the right leg.  She experiences pain in the posterior aspect of her right thigh and also in her right calf.  This is worse when she is sitting.  It feels better when she elevates her leg.  It  also feels better when she wears her compression stockings.  She has had back problems in the past but no recent back problems.  She had a disc issue in the past.  She denies any previous history of DVT.  She did have a portal vein thrombosis in 2021 and for this reason is on Eliquis.  She does wear knee-high compression stockings and tries to elevate her legs every day.  I do not get any history of claudication or rest pain.  She is unaware of any specific injury to the right leg.  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 Treating with URSO due to elevated AMA. LFTs slowly improving. Concern for recurrent non-tense ascites on exam: patient feels uncomfortable and requesting paracentesis. This is likely   Asthma due to seasonal allergies 04/17/2020   Auditory perceptual disorder    Back pain    Chronic liver disease 11/16/2017   Deep vein thrombosis of portal vein 03/06/2020   Dysphagia 05/29/2020   Fibromyalgia    Generalized anxiety disorder with panic attacks    GERD (gastroesophageal reflux disease) 07/21/2012   Hiatal hernia    History of alcohol abuse 04/26/2015   History  of colitis 11/29/2014   History of marijuana use 01/20/2015   Hypertriglyceridemia    Hypothyroid    Iritis    frequent   Iron deficiency anemia 05/13/2020   Macrocytic anemia 03/06/2020   Major depressive disorder 09/10/2020   Ovarian cyst    Polyarthralgia 03/06/2020   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   PTSD (post-traumatic stress disorder) 09/10/2020   S/P closed fracture of wrist 03/06/2020   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   SVT (supraventricular tachycardia)    last issue 12/2017   Thyroid nodule 01/11/2014    Family History  Problem Relation Age of Onset   Stomach  cancer Paternal Grandfather        colon cancer   Cancer Paternal Grandfather        throat and esophagus   Other Paternal Grandmother        hernia   COPD Paternal Grandmother    Diabetes Paternal Grandmother    Breast cancer Maternal Grandmother    Cancer Maternal Grandmother        skin   Anxiety disorder Maternal Grandmother    Dementia Maternal Grandmother    Diabetes Maternal Grandfather    Heart disease Maternal Grandfather    Dementia Maternal Grandfather    Other Father        varicose veins; stomach issues; hernia   Hypertension Father    Hyperlipidemia Father    Cancer Father        prostate   Arthritis Father        rheumatoid   Neuropathy Father    Rheum arthritis Father    Cancer Mother        skin   Breast cancer Mother    Anxiety disorder Mother    Hypertension Mother    Other Mother        fatty liver   Hyperlipidemia Mother    Liver disease Mother        fatty liver, does not drink.    Sleep apnea Mother    Other Brother        hernia   Thyroid disease Sister    Hypertension Sister    Healthy Daughter     SOCIAL HISTORY: Social History   Tobacco Use   Smoking status: Some Days    Packs/day: 0.25    Years: 2.00    Total pack years: 0.50    Types: Cigarettes    Start date: 12/22/2017    Passive exposure: Never   Smokeless tobacco: Never   Tobacco comments:    2 cigs per day  Substance Use Topics   Alcohol use: No    Alcohol/week: 0.0 standard drinks of alcohol    Comment: Sober since 2018    Allergies  Allergen Reactions   Amoxicillin-Pot Clavulanate Anaphylaxis   Penicillins Anaphylaxis    Has patient had a PCN reaction causing immediate rash, facial/tongue/throat swelling, SOB or lightheadedness with hypotension: yes Has patient had a PCN reaction causing severe rash involving mucus membranes or skin necrosis: No Has patient had a PCN reaction that required hospitalization Yes Has patient had a PCN reaction occurring within the  last 10 years: No If all of the above answers are "NO", then may proceed with Cephalosporin use.    Avelox [Moxifloxacin]     Tunnel vision, seeing spots, joint pain   Lidocaine Other (See Comments)    Can use topical lidocaine but if ingested causes Hallucinations.    Current Outpatient Medications  Medication Sig Dispense Refill  acetaminophen (TYLENOL) 500 MG tablet Take 1,000 mg by mouth every 6 (six) hours as needed.     albuterol (VENTOLIN HFA) 108 (90 Base) MCG/ACT inhaler INHALE 2 PUFFS INTO THE LUNGS EVERY 6 HOURS AS NEEDED FOR WHEEZING OR SHORTNESS OF BREATH 8.5 g 1   Aripiprazole 1 MG/ML mL SMARTSIG:3 Milliliter(s) By Mouth Every Morning     budesonide-formoterol (SYMBICORT) 80-4.5 MCG/ACT inhaler Take 2 puffs first thing in am and then another 2 puffs about 12 hours later. 1 each 11   Chlorphen-PE-Acetaminophen (NOREL AD) 4-10-325 MG TABS Take 1 tablet by mouth 3 (three) times daily as needed (Nasal congestion). 30 tablet 0   Cholecalciferol (VITAMIN D3) 125 MCG (5000 UT) CAPS Take 1 capsule (5,000 Units total) by mouth daily. 100 capsule 3   cholestyramine (QUESTRAN) 4 g packet Take 1 packet by mouth 2 (two) times daily.     cyanocobalamin (,VITAMIN B-12,) 1000 MCG/ML injection ADMINISTER 1 ML(1000 MCG) IN THE MUSCLE EVERY 30 DAYS 3 mL 3   ELIQUIS 5 MG TABS tablet TAKE 1 TABLET(5 MG) BY MOUTH TWICE DAILY 60 tablet 1   FOLIC ACID PO Take by mouth.     hydrOXYzine (ATARAX) 10 MG tablet Take 10 mg by mouth 2 (two) times daily.     hydrOXYzine (VISTARIL) 25 MG capsule Take 1 capsule (25 mg total) by mouth every 8 (eight) hours as needed for itching. 30 capsule 3   methylPREDNISolone (MEDROL DOSEPAK) 4 MG TBPK tablet Take as package instructions. 1 each 0   MILK THISTLE PO Take by mouth daily.     mycophenolate (CELLCEPT) 250 MG capsule Take by mouth.     ondansetron (ZOFRAN) 4 MG tablet Take 1 tablet (4 mg total) by mouth every 6 (six) hours. 12 tablet 0   OVER THE COUNTER  MEDICATION Premeir protein drink once a day     pantoprazole (PROTONIX) 40 MG tablet Take 1 tablet (40 mg total) by mouth 2 (two) times daily before a meal. (Patient taking differently: Take 40 mg by mouth.) 180 tablet 1   prednisoLONE acetate (PRED FORTE) 1 % ophthalmic suspension Place into the left eye.     pregabalin (LYRICA) 50 MG capsule Take 1 capsule (50 mg total) by mouth 2 (two) times daily. (Patient taking differently: Take 50 mg by mouth as needed.) 60 capsule 3   promethazine-dextromethorphan (PROMETHAZINE-DM) 6.25-15 MG/5ML syrup Take 5 mLs by mouth 4 (four) times daily as needed for cough. 118 mL 0   pyridOXINE (VITAMIN B6) 25 MG tablet Take 1 3 x daily     UNABLE TO FIND Iron infusion-every 3 months     ursodiol (ACTIGALL) 500 MG tablet TAKE 1 TABLET BY MOUTH TWICE DAILY 60 tablet 11   valACYclovir (VALTREX) 1000 MG tablet Take 1,000 mg by mouth daily.     venlafaxine XR (EFFEXOR-XR) 150 MG 24 hr capsule SMARTSIG:1 Capsule(s) By Mouth Every Evening     venlafaxine XR (EFFEXOR-XR) 75 MG 24 hr capsule Take 225 mg by mouth daily.     No current facility-administered medications for this visit.    REVIEW OF SYSTEMS:  '[X]'$  denotes positive finding, '[ ]'$  denotes negative finding Cardiac  Comments:  Chest pain or chest pressure:    Shortness of breath upon exertion:    Short of breath when lying flat:    Irregular heart rhythm:        Vascular    Pain in calf, thigh, or hip brought on by ambulation:  Pain in feet at night that wakes you up from your sleep:     Blood clot in your veins:    Leg swelling:         Pulmonary    Oxygen at home:    Productive cough:     Wheezing:         Neurologic    Sudden weakness in arms or legs:     Sudden numbness in arms or legs:     Sudden onset of difficulty speaking or slurred speech:    Temporary loss of vision in one eye:     Problems with dizziness:         Gastrointestinal    Blood in stool:     Vomited blood:          Genitourinary    Burning when urinating:     Blood in urine:        Psychiatric    Major depression:         Hematologic    Bleeding problems:    Problems with blood clotting too easily:        Skin    Rashes or ulcers:        Constitutional    Fever or chills:     PHYSICAL EXAM:   Vitals:   06/10/22 1453  BP: 107/64  Pulse: 72  Resp: 18  Temp: 98.7 F (37.1 C)  TempSrc: Temporal  SpO2: 98%  Weight: 142 lb 3.2 oz (64.5 kg)  Height: '5\' 4"'$  (1.626 m)   Body mass index is 24.41 kg/m.  GENERAL: The patient is a well-nourished female, in no acute distress. The vital signs are documented above. CARDIAC: There is a regular rate and rhythm.  VASCULAR: I do not detect carotid bruits. She has palpable dorsalis pedis and posterior tibial pulses bilaterally. She has about phasic dorsalis pedis and posterior tibial signal bilaterally. She has some reticular veins along the lateral aspect of her right thigh as documented in the photograph below.  I did look at the superficial veins myself on the right with the SonoSite and the great saphenous vein and small saphenous vein are very small and not dilated. PULMONARY: There is good air exchange bilaterally without wheezing or rales. ABDOMEN: Soft and non-tender with normal pitched bowel sounds.  MUSCULOSKELETAL: There are no major deformities or cyanosis. NEUROLOGIC: No focal weakness or paresthesias are detected. SKIN: There are no ulcers or rashes noted. PSYCHIATRIC: The patient has a normal affect.  DATA:    VENOUS DUPLEX: I have reviewed her venous duplex scan that was done on 05/21/2022.  This was of the right lower extremity only.  There was no evidence of DVT.  There was deep venous reflux in the common femoral vein only.  There was no significant superficial venous reflux except for a short segment of reflux in the small saphenous vein in the proximal calf and mid calf however the vein was very small.  Results of the study  are summarized in the diagram below.    A total of 42 minutes was spent on this visit. 22 minutes was face to face time. More than 50% of the time was spent on counseling and coordinating with the patient.    Deitra Mayo [facility] [phone]

## 2022-06-16 ENCOUNTER — Ambulatory Visit (INDEPENDENT_AMBULATORY_CARE_PROVIDER_SITE_OTHER): Payer: Medicaid Other | Admitting: Psychiatry

## 2022-06-16 DIAGNOSIS — F4310 Post-traumatic stress disorder, unspecified: Secondary | ICD-10-CM | POA: Diagnosis not present

## 2022-06-16 DIAGNOSIS — F321 Major depressive disorder, single episode, moderate: Secondary | ICD-10-CM

## 2022-06-16 NOTE — Progress Notes (Unsigned)
Virtual Visit via Telephone Note  I connected with Kara Stewart on 06/16/22 at 10:15 AM by telephone and verified that I am speaking with the correct person using two identifiers.  Location: Patient: Home Provider: [place] office    I discussed the limitations, risks, security and privacy concerns of performing an evaluation and management service by telephone and the availability of in person appointments. I also discussed with the patient that there may be a patient responsible charge related to this service. The patient expressed understanding and agreed to proceed.   .  I provided *** minutes of non-face-to-face time during this encounter.   Kara Smoker, LCSW      THERAPIST PROGRESS NOTE  Session Time: Tuesday  06/16/2022 10:15 AM    Participation Level: Active  Behavioral Response: CasualAlertAnxious and Depressed  Type of Therapy: Individual Therapy  Treatment Goals addressed: Reduce overall frequency, intensity, and duration of anxiety so that daily functioning is not impaired AEB patient reducing panic episodes to 2 or less per month  Progress toward goals: not progressing     Interventions: CBT and Supportive            Summary: Kara Stewart is a 47 y.o. female who is referred for services by PCP Dr. Posey Pronto due to pt experiencing symptoms of depression and anxiety. She denies any psychiatric hospitalizations, She reports participating in therapy through [facility] and last was seen 10 years ago.  Patient states her everyday life is up in the air and she just cannot get it together.  She reports multiple physical ailments and was diagnosed with primary biliary cirrhosis.  She reports worrying about how much longer she has to live.  Current symptoms and problems include emotional outburst, becoming overly emotional about small things, becoming irritated easily, difficulty completing task around the house, and feeling overwhelmed.  Patient also presents with  a trauma history.    Patient last was seen  about 2 months  ago.  She reports continued symptoms of anxiety as since last session.  She reports experiencing irritability daily and being snappy.  She is thankful she has sold her home and has purchased another home.  She expresses frustration as she had to sell her home for much less than she anticipated.  She had to get a personal loan to help finance her new home.  She reports financial stress as her first payment is due in about 2 weeks.  She expresses anger, resentment, and frustration regarding the relationship with her mother as well as with her brother.  She expresses disappointment as mother and brother have not helped her and the way she thinks they should.  She also expresses frustration as she reports mother treats patient's brother more favorably that she does patient.  Patient verbalizes frequent should and ought statements about relationship with mother and brother.  Patient reports she is trying to practice relaxation techniques but wants to do so more consistently. Suicidal/Homicidal: Nowithout intent/plan  Therapist Response: Reviewed symptoms, praised and reinforced patient's efforts to practice relaxation techniques, developed plan with patient to practice daily, discussed stressors, facilitated expression of thoughts and feelings, validated feelings, assisted patient examine her thought patterns regarding rate interaction with her mother and brother, assisted patient identify realistic expectations and replace should and ought statements with wish/prefer, assisted patient identify realistic expectations of self and ways to problem solve/pace self, developed plan with patient to complete daily gratitude list   plan: Return again in 2 weeks  Diagnosis: Axis  I: PTSD, MDD     Collaboration of Care: Medication management AEB encouraged patient to follow through with  appointment for med eval/management with psychiatrist at [place] was advised Release of Information must be obtained prior to any record release in order to collaborate their care with an outside provider. Patient/Guardian was advised if they have not already done so to contact the registration department to sign all necessary forms in order for [place] to release information regarding their care.   Consent: Patient/Guardian gives verbal consent for treatment and assignment of benefits for services provided during this visit. Patient/Guardian expressed understanding and agreed to proceed.    Kara Smoker, LCSW 06/16/2022    Kara Smoker, LCSW

## 2022-06-19 DIAGNOSIS — Z79899 Other long term (current) drug therapy: Secondary | ICD-10-CM | POA: Insufficient documentation

## 2022-06-19 DIAGNOSIS — M069 Rheumatoid arthritis, unspecified: Secondary | ICD-10-CM | POA: Insufficient documentation

## 2022-06-27 ENCOUNTER — Other Ambulatory Visit: Payer: Self-pay | Admitting: Internal Medicine

## 2022-06-27 DIAGNOSIS — G629 Polyneuropathy, unspecified: Secondary | ICD-10-CM

## 2022-07-02 ENCOUNTER — Ambulatory Visit (HOSPITAL_COMMUNITY): Payer: Medicaid Other | Admitting: Psychiatry

## 2022-07-03 ENCOUNTER — Inpatient Hospital Stay: Payer: Medicare Other | Attending: Hematology

## 2022-07-03 DIAGNOSIS — E559 Vitamin D deficiency, unspecified: Secondary | ICD-10-CM | POA: Diagnosis not present

## 2022-07-03 DIAGNOSIS — E538 Deficiency of other specified B group vitamins: Secondary | ICD-10-CM | POA: Insufficient documentation

## 2022-07-03 DIAGNOSIS — D509 Iron deficiency anemia, unspecified: Secondary | ICD-10-CM | POA: Diagnosis present

## 2022-07-03 LAB — CBC WITH DIFFERENTIAL/PLATELET
Abs Immature Granulocytes: 0.02 10*3/uL (ref 0.00–0.07)
Basophils Absolute: 0.2 10*3/uL — ABNORMAL HIGH (ref 0.0–0.1)
Basophils Relative: 2 %
Eosinophils Absolute: 0.7 10*3/uL — ABNORMAL HIGH (ref 0.0–0.5)
Eosinophils Relative: 7 %
HCT: 40.5 % (ref 36.0–46.0)
Hemoglobin: 13.5 g/dL (ref 12.0–15.0)
Immature Granulocytes: 0 %
Lymphocytes Relative: 50 %
Lymphs Abs: 4.7 10*3/uL — ABNORMAL HIGH (ref 0.7–4.0)
MCH: 31.1 pg (ref 26.0–34.0)
MCHC: 33.3 g/dL (ref 30.0–36.0)
MCV: 93.3 fL (ref 80.0–100.0)
Monocytes Absolute: 0.6 10*3/uL (ref 0.1–1.0)
Monocytes Relative: 7 %
Neutro Abs: 3.2 10*3/uL (ref 1.7–7.7)
Neutrophils Relative %: 34 %
Platelets: 368 10*3/uL (ref 150–400)
RBC: 4.34 MIL/uL (ref 3.87–5.11)
RDW: 12.5 % (ref 11.5–15.5)
WBC: 9.4 10*3/uL (ref 4.0–10.5)
nRBC: 0 % (ref 0.0–0.2)

## 2022-07-03 LAB — IRON AND TIBC
Iron: 41 ug/dL (ref 28–170)
Saturation Ratios: 13 % (ref 10.4–31.8)
TIBC: 310 ug/dL (ref 250–450)
UIBC: 269 ug/dL

## 2022-07-03 LAB — FERRITIN: Ferritin: 34 ng/mL (ref 11–307)

## 2022-07-10 ENCOUNTER — Inpatient Hospital Stay: Payer: Medicare Other | Attending: Physician Assistant | Admitting: Physician Assistant

## 2022-07-10 VITALS — BP 108/75 | HR 70 | Temp 97.8°F | Resp 16 | Wt 142.4 lb

## 2022-07-10 DIAGNOSIS — D500 Iron deficiency anemia secondary to blood loss (chronic): Secondary | ICD-10-CM | POA: Diagnosis not present

## 2022-07-10 DIAGNOSIS — Z800 Family history of malignant neoplasm of digestive organs: Secondary | ICD-10-CM | POA: Diagnosis not present

## 2022-07-10 DIAGNOSIS — D75839 Thrombocytosis, unspecified: Secondary | ICD-10-CM | POA: Insufficient documentation

## 2022-07-10 DIAGNOSIS — N920 Excessive and frequent menstruation with regular cycle: Secondary | ICD-10-CM | POA: Diagnosis not present

## 2022-07-10 DIAGNOSIS — D509 Iron deficiency anemia, unspecified: Secondary | ICD-10-CM | POA: Diagnosis present

## 2022-07-10 DIAGNOSIS — Z803 Family history of malignant neoplasm of breast: Secondary | ICD-10-CM | POA: Insufficient documentation

## 2022-07-10 DIAGNOSIS — H209 Unspecified iridocyclitis: Secondary | ICD-10-CM | POA: Diagnosis not present

## 2022-07-10 DIAGNOSIS — Z9081 Acquired absence of spleen: Secondary | ICD-10-CM | POA: Diagnosis not present

## 2022-07-10 DIAGNOSIS — E538 Deficiency of other specified B group vitamins: Secondary | ICD-10-CM | POA: Insufficient documentation

## 2022-07-10 DIAGNOSIS — I81 Portal vein thrombosis: Secondary | ICD-10-CM | POA: Diagnosis not present

## 2022-07-10 DIAGNOSIS — I8391 Asymptomatic varicose veins of right lower extremity: Secondary | ICD-10-CM | POA: Diagnosis not present

## 2022-07-10 DIAGNOSIS — Z7901 Long term (current) use of anticoagulants: Secondary | ICD-10-CM | POA: Diagnosis not present

## 2022-07-10 DIAGNOSIS — K743 Primary biliary cirrhosis: Secondary | ICD-10-CM | POA: Insufficient documentation

## 2022-07-10 DIAGNOSIS — F17210 Nicotine dependence, cigarettes, uncomplicated: Secondary | ICD-10-CM | POA: Diagnosis not present

## 2022-07-10 DIAGNOSIS — E559 Vitamin D deficiency, unspecified: Secondary | ICD-10-CM | POA: Diagnosis not present

## 2022-07-10 DIAGNOSIS — D72829 Elevated white blood cell count, unspecified: Secondary | ICD-10-CM | POA: Diagnosis not present

## 2022-07-10 NOTE — Progress Notes (Signed)
[facility] [address] [place], [place] [address]   CLINIC:  Medical Oncology/Hematology  PCP:  Anabel HalonPatel, Kara K, MD [address] [place] [address] [phone]   REASON FOR VISIT:  Follow-up for portal vein thrombosis, iron deficiency, and thrombocytosis/leukocytosis s/p splenectomy  CURRENT THERAPY: Indefinite Eliquis.  Intermittent IV iron infusions (last Feraheme on 12/17/2020)  INTERVAL HISTORY:   [name] 47 y.o. 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  She was last evaluated via telemedicine visit by Kara Brennerebekah Alyxander Kollmann PA-C on 06/23/2021.     At today's visit, she reports feeling poorly secondary to her multiple chronic illnesses.  She had prolonged COVID from December 2023 through February 2024.  She continues to have significant chronic fatigue.  She is faithful with taking her Eliquis for portal vein thrombosis.  She has not noticed any epistaxis, hematemesis, hematochezia, or melena.  She went about 6 months without menstrual periods last year, but since they have resumed she reports that they are very heavy with profuse clots.  She continues to have chronic headaches, dyspnea on exertion, and lightheadedness without syncope.  She denies any pica, restless legs, chest pain, or syncope. She does have chest tightness associated with panic attacks. She has been taking her multivitamin/folic acid, B12 injections, and vitamin D 5000 units as instructed.  She has 25% energy and little to no appetite. She endorses that she is maintaining a stable weight.   ASSESSMENT & PLAN:  1.  Portal vein thrombosis - November 2021, patient fell and was found to have splenic rupture with thrombus in the main portal vein and central intrahepatic vein and branches, with edema around the pancreas, as well as fatty liver with early cirrhosis.  She had splenectomy as well as  liver biopsy that showed steatohepatitis.  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 - Hypercoagulable work-up was negative - She is stable on Eliquis, reports that she has not missed any doses since her last appointment - Denies easy bruising.  No major blood loss. - PLAN: Continue Eliquis indefinitely. - Patient instructed that if she has upcoming procedures, Eliquis can be held for 48 hours prior to procedure and resumed the following day, per surgeon discretion.  She is instructed to call our office for assistance with this once procedure has been scheduled.   2.  Iron deficiency anemia - She has chronic menorrhagia.  No symptoms of GI blood loss. - EGD (11/21/2015): Mallory-Weiss tear, likely cause of hematemesis - Colonoscopy (02/05/2015): Mild diverticulosis and small internal hemorrhoids - She was unable to tolerate oral iron tablets.  Most recent Feraheme 12/17/2020 - Her significant fatigue is multifactorial.   - Most recent labs (07/03/2022): Hgb 13.5, ferritin 34, iron saturation 13% - PLAN: Recommend IV iron with Feraheme x 2 due to symptomatic iron deficiency. - Continue to recommend that patient speak with her GYN regarding treatment options for menorrhagia  - Labs and RTC in 6 months   3.  Leukocytosis and thrombocytosis, intermittent -Intermittent leukocytosis and thrombocytosis are likely secondary to splenectomy - Most recent CBC/D (07/03/2022): Normal WBC 9.4, with mildly elevated lymphocytes 4.7, eosinophils 0.7, basophil 0.2.  Normal platelets 368. - PLAN: Repeat CBC in 6 months.  No indication for further work-up (such as JAK2, flow cytometry, BCR/ABL) unless there is significant deviation from baseline.   4. Right leg varicose veins - Symptomatic varicose veins of right thigh    -  Patient was concerned about blood clot, but venous duplex on 08/15/2020 was negative for right lower extremity DVT - She follows with vascular surgery   5.   Folate deficiency: - Folate normal 13.1 (06/19/2021) - PLAN: Continue daily multivitamin with folic acid.  Recheck folic acid at next visit  6.  B12 deficiency: - B12 normal 780, normal methylmalonic acid (06/19/2021)  - PLAN: Continue monthly B12 injections at home.   Recheck B12/MMA at next visit.  7.  Vitamin D deficiency  - Vitamin D remains low 27.29 (06/19/2021) - She is taking vitamin D 5000 units daily  - PLAN: Continue vitamin D 5000 units daily.  Recheck vitamin D in 6 months.   8.    Primary biliary cirrhosis and chronic abdominal pain: - Chronic and multifactorial secondary to primary biliary cirrhosis and splenectomy with postoperative changes (small irregular collection of left upper quadrant hematoma or seroma) - Follows with gastroenterology, and is on ursodiol - PLAN:  Continue follow-up with gastroenterology.  9.  Other issues - She is on Humira for autoimmune iritis  PLAN SUMMARY: >> IV Feraheme x 2 >> Labs in 6 months = CBC/D, CMP, ferritin, iron/TIBC, B12, MMA, folate, vitamin D >> PHONE visit in 6 months (1 week after labs)     REVIEW OF SYSTEMS:   Review of Systems  Constitutional:  Positive for fatigue. Negative for appetite change, chills, diaphoresis, fever and unexpected weight change.  HENT:   Negative for lump/mass and nosebleeds.   Eyes:  Negative for eye problems.  Respiratory:  Positive for chest tightness and cough. Negative for hemoptysis and shortness of breath.   Cardiovascular:  Positive for palpitations. Negative for chest pain and leg swelling.  Gastrointestinal:  Positive for constipation, diarrhea and nausea. Negative for abdominal pain, blood in stool and vomiting.  Genitourinary:  Negative for hematuria.   Skin: Negative.   Neurological:  Positive for dizziness and headaches. Negative for light-headedness.  Hematological:  Does not bruise/bleed easily.  Psychiatric/Behavioral:  Positive for sleep disturbance.      PHYSICAL EXAM:  ECOG  PERFORMANCE STATUS: 1 - Symptomatic but completely ambulatory  There were no vitals filed for this visit. There were no vitals filed for this visit. Physical Exam Constitutional:      Appearance: Normal appearance. She is normal weight.  Cardiovascular:     Heart sounds: Normal heart sounds.  Pulmonary:     Breath sounds: Normal breath sounds.  Neurological:     General: No focal deficit present.     Mental Status: Mental status is at baseline.  Psychiatric:        Behavior: Behavior normal. Behavior is cooperative.     PAST MEDICAL/SURGICAL HISTORY:  Past Medical History:  Diagnosis Date   Abdominal pain, epigastric 05/29/2020   Alcohol abuse, in remission    Alcoholic hepatitis with ascites 10/05/2016   Has continued to abstain from ETOH since May 2018, known severe hepatic steatosis, HSM, possible early cirrhosis. Treating with URSO due to elevated AMA. LFTs slowly improving. Concern for recurrent non-tense ascites on exam: patient feels uncomfortable and requesting paracentesis. This is likely   Asthma due to seasonal allergies 04/17/2020   Auditory perceptual disorder    Back pain    Chronic liver disease 11/16/2017   Deep vein thrombosis of portal vein 03/06/2020   Dysphagia 05/29/2020   Fibromyalgia    Generalized anxiety disorder with panic attacks    GERD (gastroesophageal reflux disease) 07/21/2012   Hiatal hernia  History of alcohol abuse 04/26/2015   History of colitis 11/29/2014   History of marijuana use 01/20/2015   Hypertriglyceridemia    Hypothyroid    Iritis    frequent   Iron deficiency anemia 05/13/2020   Macrocytic anemia 03/06/2020   Major depressive disorder 09/10/2020   Ovarian cyst    Polyarthralgia 03/06/2020   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   PTSD (post-traumatic stress disorder) 09/10/2020   S/P closed fracture of wrist 03/06/2020   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   SVT (supraventricular tachycardia)    last issue 12/2017   Thyroid  nodule 01/11/2014   Past Surgical History:  Procedure Laterality Date   BIOPSY N/A 02/05/2015   Procedure: BIOPSY;  Surgeon: West Bali, MD;  Location: [facility];  Service: Endoscopy;  Laterality: N/A;   BIOPSY STOMACH  08/2021   BREAST BIOPSY Left 2023   COLONOSCOPY     COLONOSCOPY WITH PROPOFOL N/A 02/05/2015   PJS:RPRXY HH/mild diverticulosis   ESOPHAGEAL DILATION N/A 11/21/2015   Procedure: ESOPHAGEAL DILATION;  Surgeon: Corbin Ade, MD;  Location: [facility];  Service: Endoscopy;  Laterality: N/A;   ESOPHAGOGASTRODUODENOSCOPY  11/06/2011   [name]: MILD Esophagitis/PATENT ESOPHAGEAL Stricture/  Moderate gastritis. Bx no.hpylori or celiac, +gastritis   ESOPHAGOGASTRODUODENOSCOPY (EGD) WITH PROPOFOL N/A 03/20/2014   [name]: 1. Mild esophagitis & distal esophagela stricture. 2. small hiatal hernia 3. moderate non-erosive gastritis and mild duodenits   ESOPHAGOGASTRODUODENOSCOPY (EGD) WITH PROPOFOL N/A 11/21/2015   Dr. Jena Gauss: LA grade B esophagitis, MW tear likely source of hematemesis   LAPAROSCOPIC TUBAL LIGATION Bilateral 09/14/2018   Procedure: LAPAROSCOPIC TUBAL LIGATION;  Surgeon: Allie Bossier, MD;  Location: [facility];  Service: Gynecology;  Laterality: Bilateral;   LIVER BIOPSY  02/23/2020   Procedure: OPEN LIVER BIOPSY;  Surgeon: Fritzi Mandes, MD;  Location: [facility];  Service: General;;   SAVORY DILATION N/A 03/20/2014   Procedure: SAVORY DILATION;  Surgeon: West Bali, MD;  Location: [facility];  Service: Endoscopy;  Laterality: N/A;  dilated with # 12.8, 14,15,16   SPLENECTOMY, TOTAL N/A 02/23/2020   Procedure: SPLENECTOMY;  Surgeon: Fritzi Mandes, MD;  Location: [facility] OR;  Service: General;  Laterality: N/A;   TOOTH EXTRACTION      SOCIAL HISTORY:  Social History   Socioeconomic History   Marital status: Divorced    Spouse name: Not on file   Number of children: 1   Years of education: 16   Highest education level: Some college, no degree   Occupational History   Occupation: Unemployed  Tobacco Use   Smoking status: Some Days    Packs/day: 0.25    Years: 2.00    Additional pack years: 0.00    Total pack years: 0.50    Types: Cigarettes    Start date: 12/22/2017    Passive exposure: Never   Smokeless tobacco: Never   Tobacco comments:    2 cigs per day  Vaping Use   Vaping Use: Never used  Substance and Sexual Activity   Alcohol use: No    Alcohol/week: 0.0 standard drinks of alcohol    Comment: Sober since 2018   Drug use: Yes    Types: Marijuana    Comment: CBD oil   Sexual activity: Yes    Birth control/protection: Surgical    Comment: tubal  Other Topics Concern   Not on file  Social History Narrative   Lives alone with a roommate. Dating and in safe relationship.  Previous MD: Phill MutterAnn Lewis, NP ([place], [place])   Right handed   Drinks caffeine   One story home   Social Determinants of Health   Financial Resource Strain: High Risk (02/12/2021)   Overall Financial Resource Strain (CARDIA)    Difficulty of Paying Living Expenses: Very hard  Food Insecurity: Food Insecurity Present (02/12/2021)   Hunger Vital Sign    Worried About Running Out of Food in the Last Year: Often true    Ran Out of Food in the Last Year: Often true  Transportation Needs: No Transportation Needs (02/12/2021)   PRAPARE - Administrator, Civil ServiceTransportation    Lack of Transportation (Medical): No    Lack of Transportation (Non-Medical): No  Physical Activity: Insufficiently Active (02/12/2021)   Exercise Vital Sign    Days of Exercise per Week: 7 days    Minutes of Exercise per Session: 10 min  Stress: Stress Concern Present (02/12/2021)   [employer] - Occupational Stress Questionnaire    Feeling of Stress : Very much  Social Connections: Moderately Integrated (02/12/2021)   Social Connection and Isolation Panel [NHANES]    Frequency of Communication with Friends and Family: More than three times a week    Frequency of  Social Gatherings with Friends and Family: More than three times a week    Attends Religious Services: More than 4 times per year    Active Member of [employer]ubs or Organizations: Yes    Attends BankerClub or Organization Meetings: Never    Marital Status: Divorced  Catering managerntimate Partner Violence: Not At Risk (02/12/2021)   Humiliation, Afraid, Rape, and Kick questionnaire    Fear of Current or Ex-Partner: No    Emotionally Abused: No    Physically Abused: No    Sexually Abused: No    FAMILY HISTORY:  Family History  Problem Relation Age of Onset   Stomach cancer Paternal Grandfather        colon cancer   Cancer Paternal Grandfather        throat and esophagus   Other Paternal Grandmother        hernia   COPD Paternal Grandmother    Diabetes Paternal Grandmother    Breast cancer Maternal Grandmother    Cancer Maternal Grandmother        skin   Anxiety disorder Maternal Grandmother    Dementia Maternal Grandmother    Diabetes Maternal Grandfather    Heart disease Maternal Grandfather    Dementia Maternal Grandfather    Other Father        varicose veins; stomach issues; hernia   Hypertension Father    Hyperlipidemia Father    Cancer Father        prostate   Arthritis Father        rheumatoid   Neuropathy Father    Rheum arthritis Father    Cancer Mother        skin   Breast cancer Mother    Anxiety disorder Mother    Hypertension Mother    Other Mother        fatty liver   Hyperlipidemia Mother    Liver disease Mother        fatty liver, does not drink.    Sleep apnea Mother    Other Brother        hernia   Thyroid disease Sister    Hypertension Sister    Healthy Daughter     CURRENT MEDICATIONS:  Outpatient Encounter Medications as of 07/10/2022  Medication Sig  acetaminophen (TYLENOL) 500 MG tablet Take 1,000 mg by mouth every 6 (six) hours as needed.   albuterol (VENTOLIN HFA) 108 (90 Base) MCG/ACT inhaler INHALE 2 PUFFS INTO THE LUNGS EVERY 6 HOURS AS NEEDED FOR  WHEEZING OR SHORTNESS OF BREATH   Aripiprazole 1 MG/ML mL SMARTSIG:3 Milliliter(s) By Mouth Every Morning   budesonide-formoterol (SYMBICORT) 80-4.5 MCG/ACT inhaler Take 2 puffs first thing in am and then another 2 puffs about 12 hours later.   Chlorphen-PE-Acetaminophen (NOREL AD) 4-10-325 MG TABS Take 1 tablet by mouth 3 (three) times daily as needed (Nasal congestion).   Cholecalciferol (VITAMIN D3) 125 MCG (5000 UT) CAPS Take 1 capsule (5,000 Units total) by mouth daily.   cholestyramine (QUESTRAN) 4 g packet Take 1 packet by mouth 2 (two) times daily.   cyanocobalamin (,VITAMIN B-12,) 1000 MCG/ML injection ADMINISTER 1 ML(1000 MCG) IN THE MUSCLE EVERY 30 DAYS   ELIQUIS 5 MG TABS tablet TAKE 1 TABLET(5 MG) BY MOUTH TWICE DAILY   FOLIC ACID PO Take by mouth.   hydrOXYzine (ATARAX) 10 MG tablet Take 10 mg by mouth 2 (two) times daily.   hydrOXYzine (VISTARIL) 25 MG capsule Take 1 capsule (25 mg total) by mouth every 8 (eight) hours as needed for itching.   methylPREDNISolone (MEDROL DOSEPAK) 4 MG TBPK tablet Take as package instructions.   MILK THISTLE PO Take by mouth daily.   mycophenolate (CELLCEPT) 250 MG capsule Take by mouth.   ondansetron (ZOFRAN) 4 MG tablet Take 1 tablet (4 mg total) by mouth every 6 (six) hours.   OVER THE COUNTER MEDICATION Premeir protein drink once a day   pantoprazole (PROTONIX) 40 MG tablet Take 1 tablet (40 mg total) by mouth 2 (two) times daily before a meal. (Patient taking differently: Take 40 mg by mouth.)   prednisoLONE acetate (PRED FORTE) 1 % ophthalmic suspension Place into the left eye.   pregabalin (LYRICA) 50 MG capsule TAKE 1 CAPSULE(50 MG) BY MOUTH TWICE DAILY   promethazine-dextromethorphan (PROMETHAZINE-DM) 6.25-15 MG/5ML syrup Take 5 mLs by mouth 4 (four) times daily as needed for cough.   pyridOXINE (VITAMIN B6) 25 MG tablet Take 1 3 x daily   UNABLE TO FIND Iron infusion-every 3 months   ursodiol (ACTIGALL) 500 MG tablet TAKE 1 TABLET BY  MOUTH TWICE DAILY   valACYclovir (VALTREX) 1000 MG tablet Take 1,000 mg by mouth daily.   venlafaxine XR (EFFEXOR-XR) 150 MG 24 hr capsule SMARTSIG:1 Capsule(s) By Mouth Every Evening   venlafaxine XR (EFFEXOR-XR) 75 MG 24 hr capsule Take 225 mg by mouth daily.   No facility-administered encounter medications on file as of 07/10/2022.    ALLERGIES:  Allergies  Allergen Reactions   Amoxicillin-Pot Clavulanate Anaphylaxis   Penicillins Anaphylaxis    Has patient had a PCN reaction causing immediate rash, facial/tongue/throat swelling, SOB or lightheadedness with hypotension: yes Has patient had a PCN reaction causing severe rash involving mucus membranes or skin necrosis: No Has patient had a PCN reaction that required hospitalization Yes Has patient had a PCN reaction occurring within the last 10 years: No If all of the above answers are "NO", then may proceed with Cephalosporin use.    Avelox [Moxifloxacin]     Tunnel vision, seeing spots, joint pain   Lidocaine Other (See Comments)    Can use topical lidocaine but if ingested causes Hallucinations.    LABORATORY DATA:  I have reviewed the labs as listed.  CBC    Component Value Date/Time   WBC 9.4  07/03/2022 1255   RBC 4.34 07/03/2022 1255   HGB 13.5 07/03/2022 1255   HGB 11.8 01/17/2018 1355   HCT 40.5 07/03/2022 1255   HCT 40.8 07/24/2019 0938   PLT 368 07/03/2022 1255   PLT 114 (L) 01/17/2018 1355   MCV 93.3 07/03/2022 1255   MCV 88 01/17/2018 1355   MCH 31.1 07/03/2022 1255   MCHC 33.3 07/03/2022 1255   RDW 12.5 07/03/2022 1255   RDW 13.3 01/17/2018 1355   LYMPHSABS 4.7 (H) 07/03/2022 1255   LYMPHSABS 1.1 01/17/2018 1355   MONOABS 0.6 07/03/2022 1255   EOSABS 0.7 (H) 07/03/2022 1255   EOSABS 0.1 01/17/2018 1355   BASOSABS 0.2 (H) 07/03/2022 1255   BASOSABS 0.0 01/17/2018 1355      Latest Ref Rng & Units 04/06/2022    3:31 PM 05/02/2021   11:12 AM 12/06/2020    9:34 AM  CMP  Glucose 70 - 99 mg/dL 94  96  79    BUN 6 - 20 mg/dL 9  8  9    Creatinine 0.44 - 1.00 mg/dL 8.11  9.14  7.82   Sodium 135 - 145 mmol/L 139  140  136   Potassium 3.5 - 5.1 mmol/L 3.0  4.1  4.3   Chloride 98 - 111 mmol/L 102  102  103   CO2 22 - 32 mmol/L 28  23  25    Calcium 8.9 - 10.3 mg/dL 9.4  9.7  9.2   Total Protein 6.5 - 8.1 g/dL 7.8  6.8  7.3   Total Bilirubin 0.3 - 1.2 mg/dL 0.5  0.5  0.3   Alkaline Phos 38 - 126 U/L 81  82  72   AST 15 - 41 U/L 16  22  18    ALT 0 - 44 U/L 15  14  13      DIAGNOSTIC IMAGING:  I have independently reviewed the relevant imaging and discussed with the patient.   WRAP UP:  All questions were answered. The patient knows to call the clinic with any problems, questions or concerns.  Medical decision making: Moderate  Time spent on visit: I spent 20 minutes counseling the patient face to face. The total time spent in the appointment was 30 minutes and more than 50% was on counseling.  Carnella Guadalajara, PA-C  07/10/2022 9:10 AM

## 2022-07-10 NOTE — Patient Instructions (Signed)
[facility] at [facility] **VISIT SUMMARY & IMPORTANT INSTRUCTIONS **   You were seen today by Rojelio Brenner PA-C for your iron deficiency anemia and other hematologic conditions. We will schedule you for IV iron x 2. Continue to take Eliquis twice daily. Continue to take your other vitamins (multivitamin/folic acid, B12 injections, vitamin D) as prescribed.  HIGH WHITE BLOOD CELLS Your elevated white blood cells (lymphocytes, eosinophils, basophils) are related to your history of splenectomy.  There is no evidence of white blood cell cancer at this time.  FOLLOW-UP APPOINTMENT: Labs and phone visit in 6 months  ** Thank you for trusting me with your healthcare!  I strive to provide all of my patients with quality care at each visit.  If you receive a survey for this visit, I would be so grateful to you for taking the time to provide feedback.  Thank you in advance!  ~ Jonny Dearden                   Dr. Doreatha Massed   &   Rojelio Brenner, PA-C   - - - - - - - - - - - - - - - - - -    Thank you for choosing [facility] at [facility] to provide your oncology and hematology care.  To afford each patient quality time with our provider, please arrive at least 15 minutes before your scheduled appointment time.   If you have a lab appointment with the Cancer Center please come in thru the Main Entrance and check in at the main information desk.  You need to re-schedule your appointment should you arrive 10 or more minutes late.  We strive to give you quality time with our providers, and arriving late affects you and other patients whose appointments are after yours.  Also, if you no show three or more times for appointments you may be dismissed from the clinic at the providers discretion.     Again, thank you for choosing [facility].  Our hope is that these requests will decrease the amount of time that you wait before being seen  by our physicians.       _____________________________________________________________  Should you have questions after your visit to [facility], please contact our office at [phone] and follow the prompts.  Our office hours are 8:00 a.m. and 4:30 p.m. Monday - Friday.  Please note that voicemails left after 4:00 p.m. may not be returned until the following business day.  We are closed weekends and major holidays.  You do have access to a nurse 24-7, just call the main number to the clinic [phone] and do not press any options, hold on the line and a nurse will answer the phone.    For prescription refill requests, have your pharmacy contact our office and allow 72 hours.

## 2022-07-13 ENCOUNTER — Other Ambulatory Visit: Payer: Self-pay

## 2022-07-13 DIAGNOSIS — E538 Deficiency of other specified B group vitamins: Secondary | ICD-10-CM

## 2022-07-13 DIAGNOSIS — D500 Iron deficiency anemia secondary to blood loss (chronic): Secondary | ICD-10-CM

## 2022-07-13 DIAGNOSIS — E559 Vitamin D deficiency, unspecified: Secondary | ICD-10-CM

## 2022-07-13 DIAGNOSIS — D509 Iron deficiency anemia, unspecified: Secondary | ICD-10-CM

## 2022-07-15 ENCOUNTER — Ambulatory Visit (INDEPENDENT_AMBULATORY_CARE_PROVIDER_SITE_OTHER): Payer: Medicare Other | Admitting: Psychiatry

## 2022-07-15 DIAGNOSIS — F4310 Post-traumatic stress disorder, unspecified: Secondary | ICD-10-CM
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
## 2022-07-16 ENCOUNTER — Inpatient Hospital Stay: Payer: Medicare Other

## 2022-07-16 VITALS — BP 94/67 | HR 64 | Temp 96.6°F | Resp 16

## 2022-07-16 DIAGNOSIS — D509 Iron deficiency anemia, unspecified: Secondary | ICD-10-CM | POA: Diagnosis not present

## 2022-07-16 DIAGNOSIS — D500 Iron deficiency anemia secondary to blood loss (chronic): Secondary | ICD-10-CM

## 2022-07-16 MED ORDER — SODIUM CHLORIDE 0.9 % IV SOLN: Freq: Once | INTRAVENOUS | Status: AC

## 2022-07-16 MED ORDER — CETIRIZINE HCL 10 MG PO TABS
10.0000 mg | ORAL_TABLET | Freq: Once | ORAL | Status: AC
  Administered 2022-07-16: 10 mg via ORAL
  Filled 2022-07-16: qty 1

## 2022-07-16 MED ORDER — LORATADINE 10 MG PO TABS: 10.0000 mg | ORAL_TABLET | Freq: Once | ORAL | Status: DC

## 2022-07-16 MED ORDER — SODIUM CHLORIDE 0.9 % IV SOLN
510.0000 mg | Freq: Once | INTRAVENOUS | Status: AC
  Administered 2022-07-16: 510 mg via INTRAVENOUS
  Filled 2022-07-16: qty 510

## 2022-07-16 MED ORDER — ACETAMINOPHEN 325 MG PO TABS
650.0000 mg | ORAL_TABLET | Freq: Once | ORAL | Status: AC
  Administered 2022-07-16: 650 mg via ORAL
  Filled 2022-07-16: qty 2

## 2022-07-16 NOTE — Progress Notes (Signed)
Pharmacy has substituted cetirizine 10 mg orally x 1 as premedication for   Loratidine discontinued.  V.O. Dr Katragadda/Shaunita Seney, PharmD  

## 2022-07-16 NOTE — Patient Instructions (Signed)
[facility] [place]  Discharge Instructions: Thank you for choosing [facility] to provide your oncology and hematology care.  If you have a lab appointment with the Cancer Center - please note that after April 8th, 2024, all labs will be drawn in the cancer center.  You do not have to check in or register with the main entrance as you have in the past but will complete your check-in in the cancer center.  Wear comfortable clothing and clothing appropriate for easy access to any Portacath or PICC line.   We strive to give you quality time with your provider. You may need to reschedule your appointment if you arrive late (15 or more minutes).  Arriving late affects you and other patients whose appointments are after yours.  Also, if you miss three or more appointments without notifying the office, you may be dismissed from the clinic at the provider's discretion.      For prescription refill requests, have your pharmacy contact our office and allow 72 hours for refills to be completed.    Today you received the following chemotherapy and/or immunotherapy agents Feraheme. Ferumoxytol Injection What is this medication? FERUMOXYTOL (FER ue MOX i tol) treats low levels of iron in your body (iron deficiency anemia). Iron is a mineral that plays an important role in making red blood cells, which carry oxygen from your lungs to the rest of your body. This medicine may be used for other purposes; ask your health care provider or pharmacist if you have questions. COMMON BRAND NAME(S): Feraheme What should I tell my care team before I take this medication? They need to know if you have any of these conditions: Anemia not caused by low iron levels High levels of iron in the blood Magnetic resonance imaging (MRI) test scheduled An unusual or allergic reaction to iron, other medications, foods, dyes, or preservatives Pregnant or trying to get pregnant Breastfeeding How should I  use this medication? This medication is injected into a vein. It is given by your care team in a hospital or clinic setting. Talk to your care team the use of this medication in children. Special care may be needed. Overdosage: If you think you have taken too much of this medicine contact a poison control center or emergency room at once. NOTE: This medicine is only for you. Do not share this medicine with others. What if I miss a dose? It is important not to miss your dose. Call your care team if you are unable to keep an appointment. What may interact with this medication? Other iron products This list may not describe all possible interactions. Give your health care provider a list of all the medicines, herbs, non-prescription drugs, or dietary supplements you use. Also tell them if you smoke, drink alcohol, or use illegal drugs. Some items may interact with your medicine. What should I watch for while using this medication? Visit your care team regularly. Tell your care team if your symptoms do not start to get better or if they get worse. You may need blood work done while you are taking this medication. You may need to follow a special diet. Talk to your care team. Foods that contain iron include: whole grains/cereals, dried fruits, beans, or peas, leafy green vegetables, and organ meats (liver, kidney). What side effects may I notice from receiving this medication? Side effects that you should report to your care team as soon as possible: Allergic reactions--skin rash, itching, hives, swelling   of the face, lips, tongue, or throat Low blood pressure--dizziness, feeling faint or lightheaded, blurry vision Shortness of breath Side effects that usually do not require medical attention (report to your care team if they continue or are bothersome): Flushing Headache Joint pain Muscle pain Nausea Pain, redness, or irritation at injection site This list may not describe all possible side  effects. Call your doctor for medical advice about side effects. You may report side effects to FDA at 1-800-FDA-1088. Where should I keep my medication? This medication is given in a hospital or clinic and will not be stored at home. NOTE: This sheet is a summary. It may not cover all possible information. If you have questions about this medicine, talk to your doctor, pharmacist, or health care provider.  2023 Elsevier/Gold Standard (2020-08-14 00:00:00)       To help prevent nausea and vomiting after your treatment, we encourage you to take your nausea medication as directed.  BELOW ARE SYMPTOMS THAT SHOULD BE REPORTED IMMEDIATELY: *FEVER GREATER THAN 100.4 F (38 C) OR HIGHER *CHILLS OR SWEATING *NAUSEA AND VOMITING THAT IS NOT CONTROLLED WITH YOUR NAUSEA MEDICATION *UNUSUAL SHORTNESS OF BREATH *UNUSUAL BRUISING OR BLEEDING *URINARY PROBLEMS (pain or burning when urinating, or frequent urination) *BOWEL PROBLEMS (unusual diarrhea, constipation, pain near the anus) TENDERNESS IN MOUTH AND THROAT WITH OR WITHOUT PRESENCE OF ULCERS (sore throat, sores in mouth, or a toothache) UNUSUAL RASH, SWELLING OR PAIN  UNUSUAL VAGINAL DISCHARGE OR ITCHING   Items with * indicate a potential emergency and should be followed up as soon as possible or go to the Emergency Department if any problems should occur.  Please show the CHEMOTHERAPY ALERT CARD or IMMUNOTHERAPY ALERT CARD at check-in to the Emergency Department and triage nurse.  Should you have questions after your visit or need to cancel or reschedule your appointment, please contact [facility] [phone]  and follow the prompts.  Office hours are 8:00 a.m. to 4:30 p.m. Monday - Friday. Please note that voicemails left after 4:00 p.m. may not be returned until the following business day.  We are closed weekends and major holidays. You have access to a nurse at all times for urgent questions. Please call the main number  to the clinic [phone] and follow the prompts.  For any non-urgent questions, you may also contact your provider using MyChart. We now offer e-Visits for anyone 18 and older to request care online for non-urgent symptoms. For details visit [email or web address].   Also download the MyChart app! Go to the app store, search "MyChart", open the app, select [facility], and log in with your MyChart username and password.   

## 2022-07-16 NOTE — Progress Notes (Signed)
Patient presents today for Feraheme infusion. Vital signs stable. Patient denies any side effects related to last iron infusion. MAR reviewed and updated.   Feraheme given today per MD orders. Tolerated infusion without adverse affects. Vital signs stable. No complaints at this time. Discharged from clinic ambulatory in stable condition. Alert and oriented x 3. F/U with [facility] as scheduled.

## 2022-07-17 ENCOUNTER — Encounter: Payer: Self-pay | Admitting: Adult Health

## 2022-07-17 ENCOUNTER — Ambulatory Visit (INDEPENDENT_AMBULATORY_CARE_PROVIDER_SITE_OTHER): Payer: Medicaid Other | Admitting: Adult Health

## 2022-07-17 VITALS — BP 107/74 | HR 71 | Ht 64.5 in | Wt 143.5 lb

## 2022-07-17 DIAGNOSIS — N926 Irregular menstruation, unspecified: Secondary | ICD-10-CM

## 2022-07-17 DIAGNOSIS — Z1211 Encounter for screening for malignant neoplasm of colon: Secondary | ICD-10-CM

## 2022-07-17 DIAGNOSIS — Z01419 Encounter for gynecological examination (general) (routine) without abnormal findings: Secondary | ICD-10-CM

## 2022-07-17 DIAGNOSIS — R232 Flushing: Secondary | ICD-10-CM | POA: Diagnosis not present

## 2022-07-17 DIAGNOSIS — R4589 Other symptoms and signs involving emotional state: Secondary | ICD-10-CM

## 2022-07-17 DIAGNOSIS — N951 Menopausal and female climacteric states: Secondary | ICD-10-CM

## 2022-07-17 LAB — HEMOCCULT GUIAC POC 1CARD (OFFICE): Fecal Occult Blood, POC: NEGATIVE

## 2022-07-17 NOTE — Progress Notes (Signed)
Patient ID: Kara Stewart, Stewart   DOB: [birth year], 47 y.o.   MRN: [id number] History of Present Illness: Kara Stewart, divorced, [id number] in for a well woman gyn exam. She is having irregualr periods and hot flashes and moody. She feels bloated after eating and sees GI at [facility].   Last pap was negative HPV, NILM 02/12/21.  PCP is Dr Allena Katz.    Current Medications, Allergies, Past Medical History, Past Surgical History, Family History and Social History were reviewed in [employer] record.     Review of Systems: Patient denies any headaches, hearing loss, fatigue, blurred vision, shortness of breath, chest pain, abdominal pain, problems with bowel movements, urination, or intercourse. No joint pain. See HPI for positives.    Physical Exam:BP 107/74 (BP Location: Left Arm, Patient Position: Sitting, Cuff Size: Normal)   Pulse 71   Ht 5' 4.5" (1.638 m)   Wt 143 lb 8 oz (65.1 kg)   BMI 24.25 kg/m   General:  Well developed, well nourished, no acute distress Skin:  Warm and dry Neck:  Midline trachea, normal thyroid, good ROM, no lymphadenopathy Lungs; Clear to auscultation bilaterally Breast:  No dominant palpable mass, retraction, or nipple discharge Cardiovascular: Regular rate and rhythm Abdomen:  Soft, non tender, no hepatosplenomegaly Pelvic:  External genitalia is normal in appearance, no lesions.  The vagina is normal in appearance. Urethra has no lesions or masses. The cervix is smooth.  Uterus is felt to be normal size, shape, and contour.  No adnexal masses or tenderness noted.Bladder is non tender, no masses felt. Rectal: Good sphincter tone, no polyps, or hemorrhoids felt.  Hemoccult negative. Extremities/musculoskeletal:  No swelling or varicosities noted, no clubbing or cyanosis Psych:  No mood changes, alert and cooperative,seems happy AA is 0 Fall risk is low    07/17/2022   10:10 AM 07/15/2022    1:13 PM 06/16/2022    10:19 AM  Depression screen PHQ 2/9  Decreased Interest 3    Down, Depressed, Hopeless 3    PHQ - 2 Score 6    Altered sleeping 3    Tired, decreased energy 3    Change in appetite 3    Feeling bad or failure about yourself  3    Trouble concentrating 3    Moving slowly or fidgety/restless 0    Suicidal thoughts 0    PHQ-9 Score 21       Information is confidential and restricted. Go to Review Flowsheets to unlock data.   She is on meds    07/17/2022   10:11 AM 07/15/2022    1:16 PM 06/16/2022   10:21 AM 06/02/2022    1:18 PM  GAD 7 : Generalized Anxiety Score  Nervous, Anxious, on Edge 2     Control/stop worrying 3     Worry too much - different things 3     Trouble relaxing 3     Restless 3     Easily annoyed or irritable 3     Afraid - awful might happen 2     Total GAD 7 Score 19     Anxiety Difficulty         Information is confidential and restricted. Go to Review Flowsheets to unlock data.      [facility] - 07/17/22 1021       Pregnancy Intention Screening   Does the patient want to become pregnant in the next year? No    Does  the patient's partner want to become pregnant in the next year? No    Would the patient like to discuss contraceptive options today? No      Contraception Wrap Up   Current Method Stewart Sterilization    End Method Stewart Sterilization    Contraception Counseling Provided No            Examination chaperoned by Malachy Mood  LPN  Impression and Plan: 1. Encounter for well woman exam with routine gynecological exam Pap and physical in 1 year Lab with PCP and hematology  Mammogram was 05/26/22 to get follow up on right breast in August 2024 Colonoscopy per GI Talk to PCP about dermatology referral  2. Encounter for screening fecal occult blood testing Hemoccult was negative  - POCT occult blood stool  3. Irregular periods Discussed that missing periods and then may be heavy at times in perimenopause phase  Had pelvic US 05/11/22  has small fiborid  4. Moody  5. Hot flashes She says some of her other meds, can cause this too.  6. Peri-menopause Review handout on perimenopause and menopause  She is on eliquis

## 2022-07-24 ENCOUNTER — Inpatient Hospital Stay: Payer: Medicare Other

## 2022-07-24 VITALS — BP 98/63 | HR 60 | Temp 97.7°F | Resp 16

## 2022-07-24 DIAGNOSIS — D509 Iron deficiency anemia, unspecified: Secondary | ICD-10-CM | POA: Diagnosis not present

## 2022-07-24 DIAGNOSIS — D500 Iron deficiency anemia secondary to blood loss (chronic): Secondary | ICD-10-CM

## 2022-07-24 MED ORDER — SODIUM CHLORIDE 0.9 % IV SOLN
510.0000 mg | Freq: Once | INTRAVENOUS | Status: AC
  Administered 2022-07-24: 510 mg via INTRAVENOUS
  Filled 2022-07-24: qty 510

## 2022-07-24 MED ORDER — SODIUM CHLORIDE 0.9 % IV SOLN: Freq: Once | INTRAVENOUS | Status: AC

## 2022-07-24 MED ORDER — CETIRIZINE HCL 10 MG PO TABS
10.0000 mg | ORAL_TABLET | Freq: Once | ORAL | Status: AC
  Administered 2022-07-24: 10 mg via ORAL
  Filled 2022-07-24: qty 1

## 2022-07-24 MED ORDER — ACETAMINOPHEN 325 MG PO TABS
650.0000 mg | ORAL_TABLET | Freq: Once | ORAL | Status: AC
  Administered 2022-07-24: 650 mg via ORAL
  Filled 2022-07-24: qty 2

## 2022-07-24 MED ORDER — LORATADINE 10 MG PO TABS: 10.0000 mg | ORAL_TABLET | Freq: Once | ORAL | Status: DC

## 2022-07-24 NOTE — Patient Instructions (Signed)
[facility]  Discharge Instructions: Thank you for choosing [facility] to provide your oncology and hematology care.  If you have a lab appointment with the Cancer Center - please note that after April 8th, 2024, all labs will be drawn in the cancer center.  You do not have to check in or register with the main entrance as you have in the past but will complete your check-in in the cancer center.  Wear comfortable clothing and clothing appropriate for easy access to any Portacath or PICC line.   We strive to give you quality time with your provider. You may need to reschedule your appointment if you arrive late (15 or more minutes).  Arriving late affects you and other patients whose appointments are after yours.  Also, if you miss three or more appointments without notifying the office, you may be dismissed from the clinic at the provider's discretion.      For prescription refill requests, have your pharmacy contact our office and allow 72 hours for refills to be completed.    Today you received the following chemotherapy and/or immunotherapy agents Feraheme      To help prevent nausea and vomiting after your treatment, we encourage you to take your nausea medication as directed.  BELOW ARE SYMPTOMS THAT SHOULD BE REPORTED IMMEDIATELY: *FEVER GREATER THAN 100.4 F (38 C) OR HIGHER *CHILLS OR SWEATING *NAUSEA AND VOMITING THAT IS NOT CONTROLLED WITH YOUR NAUSEA MEDICATION *UNUSUAL SHORTNESS OF BREATH *UNUSUAL BRUISING OR BLEEDING *URINARY PROBLEMS (pain or burning when urinating, or frequent urination) *BOWEL PROBLEMS (unusual diarrhea, constipation, pain near the anus) TENDERNESS IN MOUTH AND THROAT WITH OR WITHOUT PRESENCE OF ULCERS (sore throat, sores in mouth, or a toothache) UNUSUAL RASH, SWELLING OR PAIN  UNUSUAL VAGINAL DISCHARGE OR ITCHING   Items with * indicate a potential emergency and should be followed up as soon as possible or go to the  Emergency Department if any problems should occur.  Please show the CHEMOTHERAPY ALERT CARD or IMMUNOTHERAPY ALERT CARD at check-in to the Emergency Department and triage nurse.  Should you have questions after your visit or need to cancel or reschedule your appointment, please contact [facility] [phone]  and follow the prompts.  Office hours are 8:00 a.m. to 4:30 p.m. Monday - Friday. Please note that voicemails left after 4:00 p.m. may not be returned until the following business day.  We are closed weekends and major holidays. You have access to a nurse at all times for urgent questions. Please call the main number to the clinic [phone] and follow the prompts.  For any non-urgent questions, you may also contact your provider using MyChart. We now offer e-Visits for anyone 87 and older to request care online for non-urgent symptoms. For details visit [email or web address].   Also download the MyChart app! Go to the app store, search "MyChart", open the app, select [place], and log in with your MyChart username and password.

## 2022-07-24 NOTE — Progress Notes (Signed)
Patient presents today for Feraheme infusion per providers order.  Vital signs WNL.  Patient has no new complaints at this time.    Peripheral IV started and blood return noted pre and post infusion.  Stable during infusion without adverse affects.  Vital signs stable.  No complaints at this time.  Discharge from clinic ambulatory in stable condition.  Patient refusing to stay, thirty minute post infusion wait time.  Alert and oriented X 3.  Follow up with [facility] as scheduled.

## 2022-08-11 ENCOUNTER — Encounter: Payer: Self-pay | Admitting: Internal Medicine

## 2022-08-11 ENCOUNTER — Ambulatory Visit (INDEPENDENT_AMBULATORY_CARE_PROVIDER_SITE_OTHER): Payer: Medicare Other | Admitting: Internal Medicine

## 2022-08-11 VITALS — BP 105/66 | HR 82 | Ht 64.5 in | Wt 145.0 lb

## 2022-08-11 DIAGNOSIS — F411 Generalized anxiety disorder: Secondary | ICD-10-CM | POA: Diagnosis not present

## 2022-08-11 DIAGNOSIS — G629 Polyneuropathy, unspecified: Secondary | ICD-10-CM

## 2022-08-11 DIAGNOSIS — S80861A Insect bite (nonvenomous), right lower leg, initial encounter: Secondary | ICD-10-CM

## 2022-08-11 DIAGNOSIS — M546 Pain in thoracic spine: Secondary | ICD-10-CM

## 2022-08-11 DIAGNOSIS — I81 Portal vein thrombosis: Secondary | ICD-10-CM

## 2022-08-11 DIAGNOSIS — M25512 Pain in left shoulder: Secondary | ICD-10-CM

## 2022-08-11 DIAGNOSIS — R739 Hyperglycemia, unspecified: Secondary | ICD-10-CM

## 2022-08-11 DIAGNOSIS — E782 Mixed hyperlipidemia: Secondary | ICD-10-CM

## 2022-08-11 DIAGNOSIS — W57XXXA Bitten or stung by nonvenomous insect and other nonvenomous arthropods, initial encounter: Secondary | ICD-10-CM | POA: Insufficient documentation

## 2022-08-11 DIAGNOSIS — Z0001 Encounter for general adult medical examination with abnormal findings: Secondary | ICD-10-CM | POA: Insufficient documentation

## 2022-08-11 DIAGNOSIS — G8929 Other chronic pain: Secondary | ICD-10-CM

## 2022-08-11 DIAGNOSIS — F410 Panic disorder [episodic paroxysmal anxiety] without agoraphobia: Secondary | ICD-10-CM

## 2022-08-11 DIAGNOSIS — L821 Other seborrheic keratosis: Secondary | ICD-10-CM | POA: Insufficient documentation

## 2022-08-11 DIAGNOSIS — F332 Major depressive disorder, recurrent severe without psychotic features: Secondary | ICD-10-CM

## 2022-08-11 DIAGNOSIS — K743 Primary biliary cirrhosis: Secondary | ICD-10-CM | POA: Diagnosis not present

## 2022-08-11 DIAGNOSIS — E559 Vitamin D deficiency, unspecified: Secondary | ICD-10-CM

## 2022-08-11 MED ORDER — APIXABAN 5 MG PO TABS
5.0000 mg | ORAL_TABLET | Freq: Two times a day (BID) | ORAL | 5 refills | Status: AC
Start: 2022-08-11 — End: ?

## 2022-08-11 MED ORDER — DOXYCYCLINE HYCLATE 100 MG PO TABS
100.0000 mg | ORAL_TABLET | Freq: Two times a day (BID) | ORAL | 0 refills | Status: DC
Start: 2022-08-11 — End: 2022-12-15

## 2022-08-11 MED ORDER — CYCLOBENZAPRINE HCL 5 MG PO TABS
5.0000 mg | ORAL_TABLET | Freq: Two times a day (BID) | ORAL | 1 refills | Status: AC | PRN
Start: 2022-08-11 — End: ?

## 2022-08-11 NOTE — Assessment & Plan Note (Addendum)
Recent tick bite with unknown duration of attachment Started empiric doxycycline Advised to apply lotion or moisturizer for itching

## 2022-08-11 NOTE — Patient Instructions (Addendum)
Please take Flexeril as needed for upper back pain or muscle spasms. Please take Lyrica regularly.  You are being referred to Neurology for peripheral neuropathy and memory concerns. [facility] - [phone]  Please continue to take other medications as prescribed.  Please continue to follow low salt diet and perform moderate exercise/walking at least 150 mins/week.

## 2022-08-11 NOTE — Assessment & Plan Note (Signed)
Upper back pain could be due to DDD of thoracic spine Check x-ray of thoracic spine Flexeril as needed for muscle spasms
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
## 2022-08-11 NOTE — Assessment & Plan Note (Signed)
On Ursodiol Follows up with GI On vitamin supplements Vistaril as needed for itching for now 

## 2022-08-11 NOTE — Assessment & Plan Note (Signed)
Has chronic numbness, tingling and burning pain in back and LE Has mood symptoms with gabapentin Switched to Lyrica, needs to be compliant Has weakness of the hands now Referred to [facility] neurology

## 2022-08-11 NOTE — Assessment & Plan Note (Signed)
Uncontrolled, on Effexor, Abilify and as needed Vistaril Sees BH therapist Followed by [facility] in [place] 

## 2022-08-11 NOTE — Assessment & Plan Note (Signed)
On Effexor and Abilify Has Vistaril as needed Follow-up with [facility] therapist Relaxation techniques and deep breathing exercises as advised by [facility] therapist

## 2022-08-11 NOTE — Assessment & Plan Note (Signed)
On Eliquis F/u with GI and Heme/Onc. 

## 2022-08-11 NOTE — Assessment & Plan Note (Signed)
Brownish patch over chest wall area likely seborrheic keratosis Referred to dermatology

## 2022-08-11 NOTE — Assessment & Plan Note (Signed)
Left shoulder pain, could be due to tendinitis versus rotator cuff injury Check x-ray of left shoulder Avoid oral NSAIDs as she is taking Eliquis and has history of GERD Tylenol as needed for pain

## 2022-08-13 ENCOUNTER — Ambulatory Visit (HOSPITAL_COMMUNITY)
Admission: RE | Admit: 2022-08-13 | Discharge: 2022-08-13 | Disposition: A | Discharge status: Home / Self Care | Payer: Medicare Other | Source: Ambulatory Visit | Attending: Internal Medicine | Admitting: Internal Medicine

## 2022-08-13 DIAGNOSIS — G8929 Other chronic pain: Secondary | ICD-10-CM | POA: Insufficient documentation

## 2022-08-13 DIAGNOSIS — M25512 Pain in left shoulder: Secondary | ICD-10-CM | POA: Insufficient documentation

## 2022-08-13 DIAGNOSIS — M546 Pain in thoracic spine: Secondary | ICD-10-CM | POA: Insufficient documentation

## 2022-08-17 ENCOUNTER — Other Ambulatory Visit: Payer: Self-pay | Admitting: Internal Medicine

## 2022-08-17 ENCOUNTER — Encounter: Payer: Self-pay | Admitting: Internal Medicine

## 2022-08-17 DIAGNOSIS — G8929 Other chronic pain: Secondary | ICD-10-CM

## 2022-08-19 ENCOUNTER — Ambulatory Visit (INDEPENDENT_AMBULATORY_CARE_PROVIDER_SITE_OTHER): Payer: Medicare Other | Admitting: Psychiatry

## 2022-08-19 ENCOUNTER — Encounter: Payer: Self-pay | Admitting: Hematology

## 2022-08-19 ENCOUNTER — Ambulatory Visit (INDEPENDENT_AMBULATORY_CARE_PROVIDER_SITE_OTHER): Payer: Medicare Other | Admitting: Orthopedic Surgery

## 2022-08-19 ENCOUNTER — Encounter: Payer: Self-pay | Admitting: Orthopedic Surgery

## 2022-08-19 VITALS — BP 138/89 | HR 78 | Ht 64.5 in | Wt 143.0 lb

## 2022-08-19 DIAGNOSIS — M25512 Pain in left shoulder: Secondary | ICD-10-CM | POA: Diagnosis not present

## 2022-08-19 DIAGNOSIS — M62838 Other muscle spasm: Secondary | ICD-10-CM | POA: Diagnosis not present

## 2022-08-19 DIAGNOSIS — F4310 Post-traumatic stress disorder, unspecified: Secondary | ICD-10-CM

## 2022-08-19 DIAGNOSIS — G8929 Other chronic pain: Secondary | ICD-10-CM | POA: Diagnosis not present

## 2022-08-19 DIAGNOSIS — F321 Major depressive disorder, single episode, moderate: Secondary | ICD-10-CM | POA: Diagnosis not present

## 2022-08-19 MED ORDER — PREDNISONE 10 MG (21) PO TBPK: ORAL_TABLET | ORAL | 0 refills | Status: DC

## 2022-08-19 NOTE — Progress Notes (Signed)
Orthopaedic Clinic Return  Assessment: Kara Stewart is a 47 y.o. female with the following: Left shoulder pain, with paraspinal muscle spasm   Plan: Kara Stewart has pain in the left side of her neck, and upper back.  Recent onset, following some yard work.  She describes burning type pain, as well as shooting pain from her neck into the upper back and left shoulder.  On physical exam, she has some spasm within the trapezius and paraspinal muscles.  She also has pretty good range of motion of the left shoulder, good strength overall.  I would like to try some prednisone at this point, to see if this improves some of her symptoms.  We can also consider a steroid injection in the left shoulder.  However, based on my physical exam today, I am less concerned about intra-articular shoulder pathology.  I would like to see her back in a month for repeat evaluation.  Meds ordered this encounter  Medications   predniSONE (STERAPRED UNI-PAK 21 TAB) 10 MG (21) TBPK tablet    Sig: 10 mg DS 12 as directed    Dispense:  48 tablet    Refill:  0    Follow-up: Return in about 4 weeks (around 09/16/2022).   Subjective:  Chief Complaint  Patient presents with   Shoulder Pain    Left / is taking Humira for her eyes and states she has been able to do more and has noticed pain in the left shoulder since increase activity    Neck Pain    History of Present Illness: Kara Stewart is a 47 y.o. female who returns to clinic for evaluation of left shoulder pain.  She is well-known to my clinic.  Within the last month, she started to have pain in the upper back, on the left side and into the left side of her neck.  It restricts her range of motion of her neck.  It hurts when she moves her left shoulder.  She isolates the pain to the medial aspect of the left scapula, extending proximally.  She notes a burning, and shooting pain.  She is limited in her activity due to the pain.  She cannot take NSAIDs.  She takes  Lyrica for neuropathy.  Review of Systems: No fevers or chills No numbness or tingling No chest pain No shortness of breath No bowel or bladder dysfunction No GI distress No headaches  Objective: BP 138/89   Pulse 78   Ht 5' 4.5" (1.638 m)   Wt 143 lb (64.9 kg)   BMI 24.17 kg/m   Physical Exam:  Alert and oriented.  No acute distress.  Slightly restricted cervical range of motion.  Tenderness to palpation within the trapezius, and the left side of her neck.  She has near full range of motion of the left shoulder, with some discomfort.  Tenderness to palpation along the medial border of the left scapula.  Fingers warm and well-perfused.  Sensation is intact distally.  IMAGING: I personally ordered and reviewed the following images:  X-rays were previously obtained in clinic of the left shoulder.  No acute injuries.  Some calcium deposition in the posterior aspect of the shoulder.  Glenohumeral joint is reduced.   Oliver Barre, MD 08/19/2022 11:08 AM

## 2022-08-19 NOTE — Patient Instructions (Signed)
 Cervical Strain and Sprain Rehab You have pain and stiffness in your neck.  The muscles around your neck are irritated.  Recommend using heat (heating pad, or hot water in the shower) to warm up the affected muscles.  Then proceed with stretching and strengthening.  Do not stretch until it hurts, but you should feel a pull.  With each exercise, you should be able to stretch a little bit further.  Attempting these exercises daily, or on a regular basis, can improve your symptoms over time.  Do not expect immediate, sustained improvement.  But, it will make your symptoms better over time.   Ask your health care provider which exercises are safe for you. Do exercises exactly as told by your health care provider and adjust them as directed. It is normal to feel mild stretching, pulling, tightness, or discomfort as you do these exercises. Stop right away if you feel sudden pain or your pain gets worse. Do not begin these exercises until told by your health care provider. Stretching and range-of-motion exercises Cervical side bending  Using good posture, sit on a stable chair or stand up. Without moving your shoulders, slowly tilt your left / right ear to your shoulder until you feel a stretch in the opposite side neck muscles. You should be looking straight ahead. Hold for 10 seconds. Repeat with the other side of your neck. Repeat 10 times. Complete this exercise 1-2 times a day. Cervical rotation  Using good posture, sit on a stable chair or stand up. Slowly turn your head to the side as if you are looking over your left / right shoulder. Keep your eyes level with the ground. Stop when you feel a stretch along the side and the back of your neck. Hold for 10 seconds. Repeat this by turning to your other side. Repeat 10 times. Complete this exercise 1-2 times a day. Thoracic extension and pectoral stretch Roll a towel or a small blanket so it is about 4 inches (10 cm) in diameter. Lie down on  your back on a firm surface. Put the towel lengthwise, under your spine in the middle of your back. It should not be under your shoulder blades. The towel should line up with your spine from your middle back to your lower back. Put your hands behind your head and let your elbows fall out to your sides. Hold for 10 seconds. Repeat 10 times. Complete this exercise 1-2 times a day. Strengthening exercises Isometric upper cervical flexion Lie on your back with a thin pillow behind your head and a small rolled-up towel under your neck. Gently tuck your chin toward your chest and nod your head down to look toward your feet. Do not lift your head off the pillow. Hold for 10 seconds. Release the tension slowly. Relax your neck muscles completely before you repeat this exercise. Repeat 10 times. Complete this exercise 1-2 times a day. Isometric cervical extension  Stand about 6 inches (15 cm) away from a wall, with your back facing the wall. Place a soft object, about 6-8 inches (15-20 cm) in diameter, between the back of your head and the wall. A soft object could be a small pillow, a ball, or a folded towel. Gently tilt your head back and press into the soft object. Keep your jaw and forehead relaxed. Hold for 10 seconds. Release the tension slowly. Relax your neck muscles completely before you repeat this exercise. Repeat 10 times. Complete this exercise 1-2 times a day. Posture   and body mechanics Body mechanics refers to the movements and positions of your body while you do your daily activities. Posture is part of body mechanics. Good posture and healthy body mechanics can help to relieve stress in your body's tissues and joints. Good posture means that your spine is in its natural S-curve position (your spine is neutral), your shoulders are pulled back slightly, and your head is not tipped forward. The following are general guidelines for applying improved posture and body mechanics to your  everyday activities. Sitting  When sitting, keep your spine neutral and keep your feet flat on the floor. Use a footrest, if necessary, and keep your thighs parallel to the floor. Avoid rounding your shoulders, and avoid tilting your head forward. When working at a desk or a computer, keep your desk at a height where your hands are slightly lower than your elbows. Slide your chair under your desk so you are close enough to maintain good posture. When working at a computer, place your monitor at a height where you are looking straight ahead and you do not have to tilt your head forward or downward to look at the screen. Standing  When standing, keep your spine neutral and keep your feet about hip-width apart. Keep a slight bend in your knees. Your ears, shoulders, and hips should line up. When you do a task in which you stand in one place for a long time, place one foot up on a stable object that is 2-4 inches (5-10 cm) high, such as a footstool. This helps keep your spine neutral. Resting When lying down and resting, avoid positions that are most painful for you. Try to support your neck in a neutral position. You can use a contour pillow or a small rolled-up towel. Your pillow should support your neck but not push on it. This information is not intended to replace advice given to you by your health care provider. Make sure you discuss any questions you have with your health care provider. Document Revised: 07/13/2018 Document Reviewed: 12/22/2017 Elsevier Patient Education  2022 [employer]  

## 2022-08-19 NOTE — Progress Notes (Signed)
THERAPIST PROGRESS NOTE  Session Time: Wednesday 08/19/2022 9:20 AM - 9:57 AM   Participation Level: Active  Behavioral Response: CasualAlertAnxious and Depressed  Type of Therapy: Individual Therapy  Treatment Goals addressed: Reduce overall frequency, intensity, and duration of anxiety so that daily functioning is not impaired AEB patient reducing panic episodes to 2 or less per month Reduce frequency, intensity, and duration of depression symptoms so that daily functioning is improved Reduce overall depression score by a minimum of 50%  on the Patient Health Questionnaire (PHQ-9)  Progress toward goals: not progressing     I nterventions: CBT and Supportive            Summary: Kara Stewart is a 47 y.o. female who is referred for services by PCP Dr. Allena Katz due to pt experiencing symptoms of depression and anxiety. She denies any psychiatric hospitalizations, She reports participating in therapy through [facility] and last was seen 10 years ago.  Patient states her everyday life is up in the air and she just cannot get it together.  She reports multiple physical ailments and was diagnosed with primary biliary cirrhosis.  She reports worrying about how much longer she has to live.  Current symptoms and problems include emotional outburst, becoming overly emotional about small things, becoming irritated easily, difficulty completing task around the house, and feeling overwhelmed.  Patient also presents with a trauma history.    Patient last was seen  about 4 - 5 weeks ago.  She reports continued symptoms of anxiety and depression as reflected in the PHQ 2 and 9 and the GAD-7.  She continues to experience multiple stressors including trauma history and her health/changed functioning.  She reports increased ruminating thoughts and irritability.  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  She also has been taking Humira shots and now is  concerned about possible side effects that she is experiencing shoulder issues.  She is thankful regarding recent conversation with PCP who is referring patient to another neurologist for a second opinion regarding cognitive issues.  She reports some relief regarding financial stress as she recently was approved for disability benefits.  Patient reports she has been using deep breathing as an intervention but has not been practicing regularly. Suicidal/Homicidal: Nowithout intent/plan  Therapist Response: Reviewed symptoms, administered PHQ 2 and 9 and GAD-7, discussed results, discussed stressors, facilitated expression of thoughts and feelings, validated feelings, praised and reinforced patient's efforts to use deep breathing as an intervention, discussed rationale for and develop plan with patient to practice deep breathing daily, discussed rationale for and develop plan with patient to resume practicing leaves on a stream mindfulness activity to cope with ruminating thoughts, checked out interactive audio activity to patient and provided with access code,   Plan: Return again in 2 weeks  Diagnosis: Axis I: PTSD, MDD     Collaboration of Care: Medication management AEB encouraged patient to follow through with  appointment for med eval/management with psychiatrist at [facility]  Patient/Guardian was advised Release of Information must be obtained prior to any record release in order to collaborate their care with an outside provider. Patient/Guardian was advised if they have not already done so to contact the registration department to sign all necessary forms in order for [place] to release information regarding their care.   Consent: Patient/Guardian gives verbal consent for treatment and assignment of benefits for services provided during this visit. Patient/Guardian expressed understanding and agreed to  proceed.    Adah Salvage, LCSW 08/19/2022    Adah Salvage, LCSW

## 2022-09-02 ENCOUNTER — Ambulatory Visit (INDEPENDENT_AMBULATORY_CARE_PROVIDER_SITE_OTHER): Payer: Medicare Other | Admitting: Psychiatry

## 2022-09-02 DIAGNOSIS — F4310 Post-traumatic stress disorder, unspecified: Secondary | ICD-10-CM | POA: Diagnosis not present

## 2022-09-02 NOTE — Progress Notes (Signed)
Virtual Visit via Telephone Note  I connected with Kara Stewart on 09/02/22 at 9:10 AM EDT by telephone and verified that I am speaking with the correct person using two identifiers.  Location: Patient: Home Provider: [facility] [place] office    I discussed the limitations, risks, security and privacy concerns of performing an evaluation and management service by telephone and the availability of in person appointments. I also discussed with the patient that there may be a patient responsible charge related to this service. The patient expressed understanding and agreed to proceed.   I provided 45 minutes of non-face-to-face time during this encounter.   Adah Salvage, LCSW       THERAPIST PROGRESS NOTE  Session Time: Wednesday 09/02/2022 9:10 AM -  9:55 AM   Participation Level: Active  Behavioral Response: CasualAlertAnxious and Depressed  Type of Therapy: Individual Therapy  Treatment Goals addressed: Reduce overall frequency, intensity, and duration of anxiety so that daily functioning is not impaired AEB patient reducing panic episodes to 2 or less per month Reduce frequency, intensity, and duration of depression symptoms so that daily functioning is improved Reduce overall depression score by a minimum of 50%  on the Patient Health Questionnaire (PHQ-9)  Progress toward goals: not progressing     I nterventions: CBT and Supportive                  Summary: Kara Stewart is a 47 y.o. female who is referred for services by PCP Dr. Allena Katz due to pt experiencing symptoms of depression and anxiety. She denies any psychiatric hospitalizations, She reports participating in therapy through [facility] and last was seen 10 years ago.  Patient states her everyday life is up in the air and she just cannot get it together.  She reports multiple physical ailments and was diagnosed with primary biliary cirrhosis.  She reports worrying about how much longer she has to live.  Current  symptoms and problems include emotional outburst, becoming overly emotional about small things, becoming irritated easily, difficulty completing task around the house, and feeling overwhelmed.  Patient also presents with a trauma history.    Patient last was seen  about 2-3 weeks ago.  She reports continued symptoms of anxiety along with increased symptoms of depression as reflected in the GAD-7 and PHQ-9.  She initially has difficulty identifying triggers of increased symptoms of depression but eventually identifies recent lab results as possible trigger.  She reports all her numbers are in the red.  This triggers worries her health may be worsening.  She has discontinued taking Humira as instructed by her doctor due to side effects.  She states knowing it takes a while for this to get out of her system and expresses some hope that her levels will change.  She is scheduled to see her doctor on June 10 to discuss results.  Patient reports experiencing extreme fatigue, soreness, poor motivation, depressed mood, and decreased interest in activities.  She also reports observing increased memory difficulty when trying to move too fast.    Suicidal/Homicidal: Nowithout intent/plan  Therapist Response: Reviewed symptoms, administered PHQ 2 and 9 and GAD-7, discussed results, assisted patient identify possible triggers of increased symptoms of depression including her body's physical reactions as well as worries about her health, assisted patient identify ways to adjust and accept changed functioning including developing a pain/fatigue/activity level chart, discussed pacing self and assisted patient identify realistic expectations of self, assisted patient link treatment outcomes/goals to values, also discussed resume  use of leaves on a stream to cope with ruminating thoughts, developed plan with patient to implement strategies discussed in session ,   Plan: Return again in 2 weeks  Diagnosis: Axis I: PTSD,  MDD     Collaboration of Care: Medication management AEB encouraged patient to follow through with  appointment for med eval/management with psychiatrist at [facility]  Patient/Guardian was advised Release of Information must be obtained prior to any record release in order to collaborate their care with an outside provider. Patient/Guardian was advised if they have not already done so to contact the registration department to sign all necessary forms in order for [place] to release information regarding their care.   Consent: Patient/Guardian gives verbal consent for treatment and assignment of benefits for services provided during this visit. Patient/Guardian expressed understanding and agreed to proceed.    Adah Salvage, LCSW 09/02/2022    Adah Salvage, LCSW

## 2022-09-09 ENCOUNTER — Encounter: Payer: Self-pay | Admitting: Hematology

## 2022-09-16 ENCOUNTER — Ambulatory Visit: Payer: Medicare Other | Admitting: Orthopedic Surgery

## 2022-09-18 ENCOUNTER — Encounter: Payer: Self-pay | Admitting: Hematology

## 2022-10-02 ENCOUNTER — Ambulatory Visit (INDEPENDENT_AMBULATORY_CARE_PROVIDER_SITE_OTHER): Payer: Medicare Other | Admitting: Psychiatry

## 2022-10-02 DIAGNOSIS — F4310 Post-traumatic stress disorder, unspecified: Secondary | ICD-10-CM | POA: Diagnosis not present

## 2022-10-02 DIAGNOSIS — F329 Major depressive disorder, single episode, unspecified: Secondary | ICD-10-CM

## 2022-10-02 DIAGNOSIS — F321 Major depressive disorder, single episode, moderate: Secondary | ICD-10-CM

## 2022-10-02 NOTE — Progress Notes (Signed)
Virtual Visit via Telephone Note  I connected with Kara Stewart on 10/02/22 at 10:07 AM EDT  by telephone and verified that I am speaking with the correct person using two identifiers.  Location: Patient: Home Provider: [facility] Out[facility] [place] office    I discussed the limitations, risks, security and privacy concerns of performing an evaluation and management service by telephone and the availability of in person appointments. I also discussed with the patient that there may be a patient responsible charge related to this service. The patient expressed understanding and agreed to proceed.     I provided 37 minutes of non-face-to-face time during this encounter.   Adah Salvage, LCSW        THERAPIST PROGRESS NOTE  Session Time: Wednesday 09/02/2022 10:07 AM - 10:44 AM   Participation Level: Active  Behavioral Response: CasualAlertAnxious and Depressed  Type of Therapy: Individual Therapy  Treatment Goals addressed: Reduce overall frequency, intensity, and duration of anxiety so that daily functioning is not impaired AEB patient reducing panic episodes to 2 or less per month Reduce frequency, intensity, and duration of depression symptoms so that daily functioning is improved Reduce overall depression score by a minimum of 50%  on the Patient Health Questionnaire (PHQ-9)  Progress toward goals: not progressing     I nterventions: CBT and Supportive                  Summary: Kara Stewart is a 47 y.o. female who is referred for services by PCP Dr. Allena Katz due to pt experiencing symptoms of depression and anxiety. She denies any psychiatric hospitalizations, She reports participating in therapy through [facility] and last was seen 10 years ago.  Patient states her everyday life is up in the air and she just cannot get it together.  She reports multiple physical ailments and was diagnosed with primary biliary cirrhosis.  She reports worrying about how much longer she has to live.   Current symptoms and problems include emotional outburst, becoming overly emotional about small things, becoming irritated easily, difficulty completing task around the house, and feeling overwhelmed.  Patient also presents with a trauma history.    Patient last was seen  about 4 weeks ago.  She reports little to no change in symptoms of anxiety and depression as reflected in the GAD-7 and PHQ-9.  She expresses frustration as she reports experiencing increased irritability and lashing out while she is on psychotropic medications.  She also expresses frustration as she reports her emotions feel dull and she is not able to fully enjoy pleasurable interaction with her daughter.  This results in guilt.  Patient reports sometimes not taking her medication due to this as well as memory difficulty.  She has been using pain/fatigue/activity chart and reports this has been helpful as she is doing more activities and pacing self.  However, she expresses frustration when she cannot complete a project.  She reports she has been using leaves on a stream to cope with ruminating thoughts and reports this has been helpful.   Suicidal/Homicidal: Nowithout intent/plan  Therapist Response: Reviewed symptoms, administered PHQ 2 and 9 and GAD-7, discussed results, discussed stressors, facilitated expression of thoughts and feelings, validated feelings, encouraged patient to contact psychiatrist to discuss concerns about medication, praised and reinforced patient's efforts to use pain/fatigue/activity level chart, discussed effects of use, assisted patient identify/challenge/and replace negative thought process regarding completing projects with more helpful thoughts, encouraged patient to continue using leaves on a stream activity, therapist and patient  agreed in session earlier as patient has schedule conflict   Plan: Return again in 2 weeks  Diagnosis: Axis I: PTSD, MDD     Collaboration of Care: Medication management AEB  encouraged patient to follow through with  appointment for med eval/management with psychiatrist at [facility]  Patient/Guardian was advised Release of Information must be obtained prior to any record release in order to collaborate their care with an outside provider. Patient/Guardian was advised if they have not already done so to contact the registration department to sign all necessary forms in order for [place] to release information regarding their care.   Consent: Patient/Guardian gives verbal consent for treatment and assignment of benefits for services provided during this visit. Patient/Guardian expressed understanding and agreed to proceed.    Adah Salvage, LCSW 10/02/2022    Adah Salvage, LCSW

## 2022-10-07 ENCOUNTER — Encounter: Payer: Self-pay | Admitting: Hematology

## 2022-10-19 ENCOUNTER — Ambulatory Visit (HOSPITAL_COMMUNITY): Payer: Medicare Other | Admitting: Psychiatry

## 2022-10-19 ENCOUNTER — Telehealth (HOSPITAL_COMMUNITY): Payer: Self-pay | Admitting: Psychiatry

## 2022-10-19 NOTE — Telephone Encounter (Signed)
Therapist attempted to contact patient via phone regarding scheduled appointment at and received voicemail message.  Therapist left message indicating attempt and requesting patient call office.

## 2022-10-21 ENCOUNTER — Other Ambulatory Visit (HOSPITAL_COMMUNITY): Payer: Self-pay | Admitting: Adult Health

## 2022-10-21 DIAGNOSIS — R921 Mammographic calcification found on diagnostic imaging of breast: Secondary | ICD-10-CM

## 2022-11-16 ENCOUNTER — Encounter: Payer: Self-pay | Admitting: Hematology

## 2022-12-01 ENCOUNTER — Ambulatory Visit (HOSPITAL_COMMUNITY)
Admission: RE | Admit: 2022-12-01 | Discharge: 2022-12-01 | Disposition: A | Discharge status: Home / Self Care | Payer: Medicare Other | Source: Ambulatory Visit | Attending: Adult Health | Admitting: Adult Health

## 2022-12-01 ENCOUNTER — Other Ambulatory Visit (HOSPITAL_COMMUNITY): Payer: Self-pay | Admitting: Adult Health

## 2022-12-01 ENCOUNTER — Encounter (HOSPITAL_COMMUNITY): Payer: Self-pay

## 2022-12-01 DIAGNOSIS — N644 Mastodynia: Secondary | ICD-10-CM | POA: Diagnosis present

## 2022-12-01 DIAGNOSIS — R921 Mammographic calcification found on diagnostic imaging of breast: Secondary | ICD-10-CM | POA: Insufficient documentation

## 2022-12-15 ENCOUNTER — Ambulatory Visit (INDEPENDENT_AMBULATORY_CARE_PROVIDER_SITE_OTHER): Payer: Medicare Other | Admitting: Internal Medicine

## 2022-12-15 ENCOUNTER — Encounter: Payer: Self-pay | Admitting: Internal Medicine

## 2022-12-15 VITALS — BP 102/70 | HR 89 | Ht 64.0 in | Wt 148.8 lb

## 2022-12-15 DIAGNOSIS — I81 Portal vein thrombosis: Secondary | ICD-10-CM | POA: Diagnosis not present

## 2022-12-15 DIAGNOSIS — G629 Polyneuropathy, unspecified: Secondary | ICD-10-CM | POA: Diagnosis not present

## 2022-12-15 DIAGNOSIS — F332 Major depressive disorder, recurrent severe without psychotic features: Secondary | ICD-10-CM | POA: Diagnosis not present

## 2022-12-15 DIAGNOSIS — K743 Primary biliary cirrhosis: Secondary | ICD-10-CM

## 2022-12-15 DIAGNOSIS — J0111 Acute recurrent frontal sinusitis: Secondary | ICD-10-CM

## 2022-12-15 MED ORDER — AMITRIPTYLINE HCL 10 MG PO TABS
10.0000 mg | ORAL_TABLET | Freq: Every day | ORAL | 3 refills | Status: DC
Start: 2022-12-15 — End: 2023-02-15

## 2022-12-15 MED ORDER — NOREL AD 4-10-325 MG PO TABS
1.0000 | ORAL_TABLET | Freq: Three times a day (TID) | ORAL | 0 refills | Status: DC | PRN
Start: 2022-12-15 — End: 2023-07-26

## 2022-12-15 MED ORDER — AZITHROMYCIN 250 MG PO TABS
ORAL_TABLET | ORAL | 0 refills | Status: AC
Start: 2022-12-15 — End: 2022-12-20

## 2022-12-15 MED ORDER — FLUTICASONE PROPIONATE 50 MCG/ACT NA SUSP
2.0000 | Freq: Every day | NASAL | 6 refills | Status: AC
Start: 2022-12-15 — End: ?

## 2022-12-15 NOTE — Assessment & Plan Note (Signed)
On Eliquis F/u with GI and Heme/Onc. 
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
## 2022-12-15 NOTE — Patient Instructions (Addendum)
Please start taking Azithromycin for sinusitis and as needed Norel for nasal congestion.  Please start taking Elavil for neuropathy and depression.  Please continue to take medications as prescribed.  Please continue to follow low carb diet and perform moderate exercise/walking at least 150 mins/week.

## 2022-12-15 NOTE — Assessment & Plan Note (Signed)
Uncontrolled, on Effexor, Abilify and as needed Vistaril Sees BH therapist Followed by [facility] in [place] 

## 2022-12-16 DIAGNOSIS — J0111 Acute recurrent frontal sinusitis: Secondary | ICD-10-CM | POA: Insufficient documentation

## 2022-12-16 NOTE — Assessment & Plan Note (Signed)
On Ursodiol Follows up with GI On vitamin supplements Vistaril as needed for itching for now 

## 2022-12-16 NOTE — Assessment & Plan Note (Signed)
Started empiric azithromycin since she has persistent symptoms despite symptomatic treatment Advised to avoid daily use of Sudafed - no more than 5 days in a row Norel AD as needed for nasal congestion Flonase for allergies

## 2022-12-16 NOTE — Assessment & Plan Note (Signed)
Has chronic numbness, tingling and burning pain in back and LE Has mood symptoms with gabapentin Switched to Lyrica, needs to be compliant Added amitriptyline, which can help with neuropathic symptoms and MDD Has weakness of the hands now Referred to [facility]y

## 2023-01-08 ENCOUNTER — Inpatient Hospital Stay: Payer: Medicare Other | Attending: Hematology

## 2023-01-15 ENCOUNTER — Inpatient Hospital Stay: Payer: Medicare Other | Admitting: Oncology

## 2023-01-27 ENCOUNTER — Encounter: Payer: Self-pay | Admitting: Hematology

## 2023-01-28 ENCOUNTER — Other Ambulatory Visit: Payer: Self-pay | Admitting: Internal Medicine

## 2023-01-28 DIAGNOSIS — J4489 Other specified chronic obstructive pulmonary disease: Secondary | ICD-10-CM

## 2023-01-29 ENCOUNTER — Encounter: Payer: Self-pay | Admitting: Adult Health

## 2023-01-29 ENCOUNTER — Ambulatory Visit: Payer: Medicare Other | Admitting: Adult Health

## 2023-01-29 VITALS — BP 108/66 | HR 70 | Ht 64.0 in | Wt 148.5 lb

## 2023-01-29 DIAGNOSIS — T192XXA Foreign body in vulva and vagina, initial encounter: Secondary | ICD-10-CM

## 2023-01-29 DIAGNOSIS — W448XXA Other foreign body entering into or through a natural orifice, initial encounter: Secondary | ICD-10-CM

## 2023-01-29 NOTE — Progress Notes (Signed)
  Subjective:     Patient ID: Kara Stewart, female   DOB: [birth year], 47 y.o.   MRN: [id number]  HPI Kymari is a 47 year old white female, divorced, G3P1021 in for ?retained tampon, put one in last night, and can't feel strings or tampon now.     Component Value Date/Time   DIAGPAP  02/12/2021 1024    - Negative for intraepithelial lesion or malignancy (NILM)   HPVHIGH Negative 02/12/2021 1024   ADEQPAP  02/12/2021 1024    Satisfactory for evaluation; transformation zone component PRESENT.   PCP is Dr Allena Katz  Review of Systems ?retained tampon. Reviewed past medical,surgical, social and family history. Reviewed medications and allergies.     Objective:   Physical Exam BP 108/66 (BP Location: Left Arm, Patient Position: Sitting, Cuff Size: Normal)   Pulse 70   Ht 5\' 4"  (1.626 m)   Wt 148 lb 8 oz (67.4 kg)   LMP 01/21/2023   BMI 25.49 kg/m     Skin warm and dry.Pelvic: external genitalia is normal in appearance no lesions, vagina: scant blood, no tampon,urethra has no lesions or masses noted, cervix:smooth and bulbous, uterus: normal size, shape and contour, non tender, no masses felt, adnexa: no masses or tenderness noted. Bladder is non tender and no masses felt.  Upstream - 01/29/23 1107       Pregnancy Intention Screening   Does the patient want to become pregnant in the next year? No    Does the patient's partner want to become pregnant in the next year? No    Would the patient like to discuss contraceptive options today? No      Contraception Wrap Up   Current Method Female Sterilization    End Method Female Sterilization    Contraception Counseling Provided No            Examination chaperoned by Tish RN Assessment:     1. Retained tampon not found on examination     Plan:     Follow up prn

## 2023-02-01 ENCOUNTER — Other Ambulatory Visit: Payer: Self-pay | Admitting: Internal Medicine

## 2023-02-01 DIAGNOSIS — J4489 Other specified chronic obstructive pulmonary disease: Secondary | ICD-10-CM

## 2023-02-01 MED ORDER — FLUTICASONE-SALMETEROL 100-50 MCG/ACT IN AEPB
1.0000 | INHALATION_SPRAY | Freq: Two times a day (BID) | RESPIRATORY_TRACT | 3 refills | Status: DC
Start: 2023-02-01 — End: 2023-02-15

## 2023-02-08 ENCOUNTER — Encounter: Payer: Self-pay | Admitting: Hematology

## 2023-02-15 ENCOUNTER — Encounter: Payer: Self-pay | Admitting: Internal Medicine

## 2023-02-15 ENCOUNTER — Ambulatory Visit (INDEPENDENT_AMBULATORY_CARE_PROVIDER_SITE_OTHER): Payer: Medicare Other | Admitting: Internal Medicine

## 2023-02-15 VITALS — BP 103/69 | HR 90 | Ht 64.0 in | Wt 146.2 lb

## 2023-02-15 DIAGNOSIS — J4489 Other specified chronic obstructive pulmonary disease: Secondary | ICD-10-CM

## 2023-02-15 DIAGNOSIS — I872 Venous insufficiency (chronic) (peripheral): Secondary | ICD-10-CM

## 2023-02-15 DIAGNOSIS — I81 Portal vein thrombosis: Secondary | ICD-10-CM | POA: Diagnosis not present

## 2023-02-15 DIAGNOSIS — F332 Major depressive disorder, recurrent severe without psychotic features: Secondary | ICD-10-CM | POA: Diagnosis not present

## 2023-02-15 MED ORDER — FLUTICASONE-SALMETEROL 100-50 MCG/ACT IN AEPB
1.0000 | INHALATION_SPRAY | Freq: Two times a day (BID) | RESPIRATORY_TRACT | 3 refills | Status: DC
Start: 2023-02-15 — End: 2023-02-16

## 2023-02-15 MED ORDER — CARIPRAZINE HCL 1.5 MG PO CAPS
1.5000 mg | ORAL_CAPSULE | Freq: Every day | ORAL | 2 refills | Status: DC
Start: 2023-02-15 — End: 2023-03-11

## 2023-02-15 MED ORDER — ALBUTEROL SULFATE HFA 108 (90 BASE) MCG/ACT IN AERS
2.0000 | INHALATION_SPRAY | Freq: Four times a day (QID) | RESPIRATORY_TRACT | 1 refills | Status: AC | PRN
Start: 2023-02-15 — End: ?

## 2023-02-15 NOTE — Assessment & Plan Note (Signed)
Uncontrolled, was on Effexor, Abilify and as needed Vistaril Sees BH therapist Used to be followed by [facility] in [place], but has stopped seeing them Started amitriptyline, can, also help with neuropathic pain

## 2023-02-15 NOTE — Assessment & Plan Note (Signed)
Continue to wear compression stockings Leg swelling better now Referred to vascular surgery - undergoing ablation therapy
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
## 2023-02-15 NOTE — Assessment & Plan Note (Signed)
On Eliquis F/u with GI and Heme/Onc. 

## 2023-02-15 NOTE — Patient Instructions (Addendum)
Schedule your Medicare Annual Wellness Visit at checkout.  Please start using Advair instead of Symbicort.  Please try to cut down -> quit smoking.  Please start taking Vraylar as prescribed.  Please follow low salt diet and perform moderate exercise/walking at least 150 mins/week.

## 2023-02-15 NOTE — Assessment & Plan Note (Addendum)
Was well controlled with Symbicort and as needed albuterol Symbicort not preferred agent with insurance, switched to Advair Norel AD as needed for nasal congestion Promethazine DM syrup as needed for cough

## 2023-02-16 LAB — CBC WITH DIFFERENTIAL/PLATELET
Basophils Absolute: 0.1 10*3/uL (ref 0.0–0.2)
Basos: 1 %
EOS (ABSOLUTE): 0.6 10*3/uL — ABNORMAL HIGH (ref 0.0–0.4)
Eos: 5 %
Hematocrit: 40.1 % (ref 34.0–46.6)
Hemoglobin: 13.4 g/dL (ref 11.1–15.9)
Immature Grans (Abs): 0 10*3/uL (ref 0.0–0.1)
Immature Granulocytes: 0 %
Lymphocytes Absolute: 5.4 10*3/uL — ABNORMAL HIGH (ref 0.7–3.1)
Lymphs: 42 %
MCH: 31.2 pg (ref 26.6–33.0)
MCHC: 33.4 g/dL (ref 31.5–35.7)
MCV: 93 fL (ref 79–97)
Monocytes Absolute: 0.9 10*3/uL (ref 0.1–0.9)
Monocytes: 7 %
Neutrophils Absolute: 5.7 10*3/uL (ref 1.4–7.0)
Neutrophils: 45 %
Platelets: 353 10*3/uL (ref 150–450)
RBC: 4.3 x10E6/uL (ref 3.77–5.28)
RDW: 12 % (ref 11.7–15.4)
WBC: 12.7 10*3/uL — ABNORMAL HIGH (ref 3.4–10.8)

## 2023-02-16 LAB — CMP14+EGFR
ALT: 8 [IU]/L (ref 0–32)
AST: 17 [IU]/L (ref 0–40)
Albumin: 4.1 g/dL (ref 3.9–4.9)
Alkaline Phosphatase: 102 [IU]/L (ref 44–121)
BUN/Creatinine Ratio: 10 (ref 9–23)
BUN: 6 mg/dL (ref 6–24)
Bilirubin Total: 0.4 mg/dL (ref 0.0–1.2)
CO2: 22 mmol/L (ref 20–29)
Calcium: 9.3 mg/dL (ref 8.7–10.2)
Chloride: 104 mmol/L (ref 96–106)
Creatinine, Ser: 0.62 mg/dL (ref 0.57–1.00)
Globulin, Total: 2.5 g/dL (ref 1.5–4.5)
Glucose: 97 mg/dL (ref 70–99)
Potassium: 3.8 mmol/L (ref 3.5–5.2)
Sodium: 142 mmol/L (ref 134–144)
Total Protein: 6.6 g/dL (ref 6.0–8.5)
eGFR: 111 mL/min/{1.73_m2} (ref >59)

## 2023-02-16 MED ORDER — FLUTICASONE-SALMETEROL 115-21 MCG/ACT IN AERO
2.0000 | INHALATION_SPRAY | Freq: Two times a day (BID) | RESPIRATORY_TRACT | 12 refills | Status: AC
Start: 2023-02-16 — End: ?

## 2023-03-08 ENCOUNTER — Encounter: Payer: Self-pay | Admitting: Hematology

## 2023-03-10 HISTORY — PX: ABLATION: SHX5711

## 2023-03-11 ENCOUNTER — Ambulatory Visit: Payer: Medicare Other | Admitting: Adult Health

## 2023-03-11 ENCOUNTER — Other Ambulatory Visit (HOSPITAL_COMMUNITY)
Admission: RE | Admit: 2023-03-11 | Discharge: 2023-03-11 | Disposition: A | Discharge status: Home / Self Care | Payer: Medicare Other | Source: Ambulatory Visit | Attending: Adult Health | Admitting: Adult Health

## 2023-03-11 ENCOUNTER — Encounter: Payer: Self-pay | Admitting: Adult Health

## 2023-03-11 VITALS — BP 116/73 | HR 81 | Ht 64.0 in | Wt 147.0 lb

## 2023-03-11 DIAGNOSIS — N898 Other specified noninflammatory disorders of vagina: Secondary | ICD-10-CM

## 2023-03-11 DIAGNOSIS — N644 Mastodynia: Secondary | ICD-10-CM

## 2023-03-11 MED ORDER — METRONIDAZOLE 0.75 % VA GEL: 1.0000 | Freq: Every day | VAGINAL | 0 refills | Status: DC

## 2023-03-11 NOTE — Progress Notes (Signed)
  Subjective:     Patient ID: Kara Stewart, female   DOB: May 01, 1975, 47 y.o.   MRN: [id number]  HPI Kara Stewart is a 47 year old white female, divorced, [id number] in complaining of right breast pain and fishy odor.     Component Value Date/Time   DIAGPAP  02/12/2021 1024    - Negative for intraepithelial lesion or malignancy (NILM)   HPVHIGH Negative 02/12/2021 1024   ADEQPAP  02/12/2021 1024    Satisfactory for evaluation; transformation zone component PRESENT.    PCP is Dr Allena Katz  Review of Systems +right breast pain  Has fishy odor in vagina usually after period Reviewed past medical,surgical, social and family history. Reviewed medications and allergies.     Objective:   Physical Exam BP 116/73 (BP Location: Left Arm, Patient Position: Sitting, Cuff Size: Normal)   Pulse 81   Ht 5\' 4"  (1.626 m)   Wt 147 lb (66.7 kg)   LMP 02/21/2023   BMI 25.23 kg/m      Skin warm and dry,  Breasts:no dominate palpable mass, retraction or nipple discharge, +tender and irregular tissue UOQ right breast  Pelvic: external genitalia is normal in appearance no lesions, vagina: white discharge with odor,urethra has no lesions or masses noted, cervix:smooth, no CMT, uterus: normal size, shape and contour, non tender, no masses felt, adnexa: no masses or tenderness noted. Bladder is non tender and no masses felt. CV swab obtained.   Upstream - 03/11/23 1117       Pregnancy Intention Screening   Does the patient want to become pregnant in the next year? No    Does the patient's partner want to become pregnant in the next year? No    Would the patient like to discuss contraceptive options today? No      Contraception Wrap Up   Current Method Female Sterilization    End Method Female Sterilization    Contraception Counseling Provided No            Examination chaperoned by Malachy Mood LPN  Assessment:     1. Breast pain, right Decrease caffeine, no more that 2 cans of sun drop a day, may  drink 10 Wear sports bra, not under wire Had mammogram 12/01/22  2. Vaginal discharge +discharge CV swab sent for GC/CHL,trich,BV and yeast  - Cervicovaginal ancillary only( [place])  3. Vaginal odor +odor, usually after a period Will rx Metrogel Meds ordered this encounter  Medications   DISCONTD: metroNIDAZOLE (METROGEL) 0.75 % vaginal gel    Sig: Place 1 Applicatorful vaginally at bedtime.    Dispense:  70 g    Refill:  0    Order Specific Question:   Supervising Provider    Answer:   Despina Hidden, LUTHER H [[id number]]   metroNIDAZOLE (METROGEL) 0.75 % vaginal gel    Sig: Place 1 Applicatorful vaginally at bedtime.    Dispense:  70 g    Refill:  0    Order Specific Question:   Supervising Provider    Answer:   Despina Hidden, LUTHER H [[id number]]   CV swab sent  - Cervicovaginal ancillary only( [place])     Plan:     Follow up prn

## 2023-03-12 LAB — CERVICOVAGINAL ANCILLARY ONLY
Bacterial Vaginitis (gardnerella): POSITIVE — AB
Candida Glabrata: NEGATIVE
Candida Vaginitis: NEGATIVE
Chlamydia: NEGATIVE
Comment: NEGATIVE
Comment: NEGATIVE
Comment: NEGATIVE
Comment: NEGATIVE
Comment: NEGATIVE
Comment: NORMAL
Neisseria Gonorrhea: NEGATIVE
Trichomonas: NEGATIVE

## 2023-03-15 ENCOUNTER — Other Ambulatory Visit: Payer: Self-pay | Admitting: Adult Health

## 2023-03-18 ENCOUNTER — Telehealth: Payer: Self-pay

## 2023-03-18 MED ORDER — METRONIDAZOLE 500 MG PO TABS: 500.0000 mg | ORAL_TABLET | Freq: Two times a day (BID) | ORAL | 0 refills | Status: DC

## 2023-03-18 NOTE — Telephone Encounter (Signed)
Pt aware that rx sent in for flagyl

## 2023-03-18 NOTE — Telephone Encounter (Signed)
Patient called and stated that Kara Stewart called her in a cream and her menstrual cycle is on and wants to know if she can get the medication in a pill form.

## 2023-03-22 ENCOUNTER — Encounter: Payer: Self-pay | Admitting: Hematology

## 2023-03-23 ENCOUNTER — Encounter: Payer: Self-pay | Admitting: Hematology

## 2023-03-26 ENCOUNTER — Encounter: Payer: Self-pay | Admitting: Internal Medicine

## 2023-03-29 ENCOUNTER — Other Ambulatory Visit: Payer: Self-pay | Admitting: Internal Medicine

## 2023-03-29 DIAGNOSIS — J0111 Acute recurrent frontal sinusitis: Secondary | ICD-10-CM

## 2023-03-29 MED ORDER — AZITHROMYCIN 250 MG PO TABS
ORAL_TABLET | ORAL | 0 refills | Status: AC
Start: 2023-03-29 — End: 2023-04-03

## 2023-04-14 ENCOUNTER — Encounter: Payer: Self-pay | Admitting: Hematology

## 2023-04-22 ENCOUNTER — Other Ambulatory Visit: Payer: Self-pay | Admitting: Adult Health

## 2023-04-22 DIAGNOSIS — N926 Irregular menstruation, unspecified: Secondary | ICD-10-CM

## 2023-04-22 NOTE — Progress Notes (Signed)
Ck TSH and FSH

## 2023-04-30 ENCOUNTER — Other Ambulatory Visit (HOSPITAL_COMMUNITY)
Admission: RE | Admit: 2023-04-30 | Discharge: 2023-04-30 | Disposition: A | Discharge status: Home / Self Care | Payer: Medicare Other | Source: Ambulatory Visit | Attending: Adult Health | Admitting: Adult Health

## 2023-04-30 DIAGNOSIS — N926 Irregular menstruation, unspecified: Secondary | ICD-10-CM | POA: Insufficient documentation

## 2023-04-30 LAB — T4, FREE: Free T4: 0.91 ng/dL (ref 0.61–1.12)

## 2023-04-30 LAB — TSH: TSH: 1.487 u[IU]/mL (ref 0.350–4.500)

## 2023-05-01 LAB — FOLLICLE STIMULATING HORMONE: FSH: 14.8 m[IU]/mL

## 2023-05-06 ENCOUNTER — Encounter: Payer: Self-pay | Admitting: Dermatology

## 2023-05-06 ENCOUNTER — Ambulatory Visit: Payer: Medicare Other | Admitting: Dermatology

## 2023-05-06 VITALS — BP 87/55

## 2023-05-06 DIAGNOSIS — D229 Melanocytic nevi, unspecified: Secondary | ICD-10-CM

## 2023-05-06 DIAGNOSIS — D1801 Hemangioma of skin and subcutaneous tissue: Secondary | ICD-10-CM | POA: Diagnosis not present

## 2023-05-06 DIAGNOSIS — L821 Other seborrheic keratosis: Secondary | ICD-10-CM | POA: Diagnosis not present

## 2023-05-06 DIAGNOSIS — W908XXA Exposure to other nonionizing radiation, initial encounter: Secondary | ICD-10-CM | POA: Diagnosis not present

## 2023-05-06 DIAGNOSIS — Z1283 Encounter for screening for malignant neoplasm of skin: Secondary | ICD-10-CM | POA: Diagnosis not present

## 2023-05-06 DIAGNOSIS — L570 Actinic keratosis: Secondary | ICD-10-CM

## 2023-05-06 DIAGNOSIS — L820 Inflamed seborrheic keratosis: Secondary | ICD-10-CM | POA: Diagnosis not present

## 2023-05-06 DIAGNOSIS — L719 Rosacea, unspecified: Secondary | ICD-10-CM

## 2023-05-06 DIAGNOSIS — L905 Scar conditions and fibrosis of skin: Secondary | ICD-10-CM

## 2023-05-06 DIAGNOSIS — L578 Other skin changes due to chronic exposure to nonionizing radiation: Secondary | ICD-10-CM

## 2023-05-06 DIAGNOSIS — L814 Other melanin hyperpigmentation: Secondary | ICD-10-CM

## 2023-05-06 DIAGNOSIS — Z808 Family history of malignant neoplasm of other organs or systems: Secondary | ICD-10-CM

## 2023-05-06 MED ORDER — METRONIDAZOLE 0.75 % EX CREA: TOPICAL_CREAM | Freq: Every day | CUTANEOUS | 6 refills | Status: AC

## 2023-05-06 NOTE — Patient Instructions (Addendum)
Hello  Grizelda,  Thank you for visiting my office today.  Below is a summary of our discussion and the outlined treatment plan:  Skin Examination: Our conversation covered various skin spots, including keratoses and potential actinic keratosis. I am pleased to report that nothing suspicious for skin cancer was found during the examination.  Treatment for Skin Spots:   Procedure: We will freeze the keratoses and actinic keratosis spots to prevent their potential development into skin cancer.   Expectation: The treated areas are expected to become crusty and naturally fall off within 2 weeks.  Metronidazole Cream:   Purpose: Prescribed for your rosacea to help reduce redness and prevent worsening.   Application: Please apply this cream both in the morning and at night.   Skin Care Advice: To reduce the risk of skin cancer, continue using sunscreen, wearing protective clothing, and practicing sun avoidance.  Follow-Up: I would like to see you again in 6 months to evaluate the progress of your treatment and to reassess your skin condition.  Should you have any questions or concerns before your next appointment, please do not hesitate to reach out to our office.  Warm regards,  Dr. Langston Reusing,  Dermatologist     Cryotherapy Aftercare  Wash gently with soap and water everyday.   Apply Vaseline and Band-Aid daily until healed.    Important Information  Due to recent changes in healthcare laws, you may see results of your pathology and/or laboratory studies on MyChart before the doctors have had a chance to review them. We understand that in some cases there may be results that are confusing or concerning to you. Please understand that not all results are received at the same time and often the doctors may need to interpret multiple results in order to provide you with the best plan of care or course of treatment. Therefore, we ask that you please give [place] 2 business days to  thoroughly review all your results before contacting the office for clarification. Should we see a critical lab result, you will be contacted sooner.   If You Need Anything After Your Visit  If you have any questions or concerns for your doctor, please call our main line at [phone] If no one answers, please leave a voicemail as directed and we will return your call as soon as possible. Messages left after 4 pm will be answered the following business day.   You may also send [place] a message via MyChart. We typically respond to MyChart messages within 1-2 business days.  For prescription refills, please ask your pharmacy to contact our office. Our fax number is [phone].  If you have an urgent issue when the clinic is closed that cannot wait until the next business day, you can page your doctor at the number below.    Please note that while we do our best to be available for urgent issues outside of office hours, we are not available 24/7.   If you have an urgent issue and are unable to reach [place], you may choose to seek medical care at your doctor's office, retail clinic, urgent care center, or emergency room.  If you have a medical emergency, please immediately call 911 or go to the emergency department. In the event of inclement weather, please call our main line at [phone] for an update on the status of any delays or closures.  Dermatology Medication Tips: Please keep the boxes that topical medications come in in order to help keep track  of the instructions about where and how to use these. Pharmacies typically print the medication instructions only on the boxes and not directly on the medication tubes.   If your medication is too expensive, please contact our office at [phone] or send [place] a message through MyChart.   We are unable to tell what your co-pay for medications will be in advance as this is different depending on your insurance coverage. However, we may be able to  find a substitute medication at lower cost or fill out paperwork to get insurance to cover a needed medication.   If a prior authorization is required to get your medication covered by your insurance company, please allow [place] 1-2 business days to complete this process.  Drug prices often vary depending on where the prescription is filled and some pharmacies may offer cheaper prices.  The website www.goodrx.com contains coupons for medications through different pharmacies. The prices here do not account for what the cost may be with help from insurance (it may be cheaper with your insurance), but the website can give you the price if you did not use any insurance.  - You can print the associated coupon and take it with your prescription to the pharmacy.  - You may also stop by our office during regular business hours and pick up a GoodRx coupon card.  - If you need your prescription sent electronically to a different pharmacy, notify our office through [facility] or by phone at [phone]

## 2023-05-06 NOTE — Progress Notes (Unsigned)
New Patient Visit   Subjective  Kara Stewart is a 48 y.o. female who presents for the following: Skin Cancer Screening and Full Body Skin Exam - No history of skin cancer. History of precancerous mole of right forearm when she was a teenager. Family history of Melanoma in maternal grandmother and family history of NMSC in mother.  The patient presents for Total-Body Skin Exam (TBSE) for skin cancer screening and mole check. The patient has spots, moles and lesions to be evaluated, some may be new or changing and the patient may have concern these could be cancer.    The following portions of the chart were reviewed this encounter and updated as appropriate: medications, allergies, medical history  Review of Systems:  No other skin or systemic complaints except as noted in HPI or Assessment and Plan.  Objective  Well appearing patient in no apparent distress; mood and affect are within normal limits.  A full examination was performed including scalp, head, eyes, ears, nose, lips, neck, chest, axillae, abdomen, back, buttocks, bilateral upper extremities, bilateral lower extremities, hands, feet, fingers, toes, fingernails, and toenails. All findings within normal limits unless otherwise noted below.   Relevant physical exam findings are noted in the Assessment and Plan.  Right cheek (3) Erythematous stuck-on, waxy papule or plaque Left Ear Erythematous thin papules/macules with gritty scale.   Assessment & Plan   SKIN CANCER SCREENING PERFORMED TODAY.  ACTINIC DAMAGE - Chronic condition, secondary to cumulative UV/sun exposure - diffuse scaly erythematous macules with underlying dyspigmentation - Recommend daily broad spectrum sunscreen SPF 30+ to sun-exposed areas, reapply every 2 hours as needed.  - Staying in the shade or wearing long sleeves, sun glasses (UVA+UVB protection) and wide brim hats (4-inch brim around the entire circumference of the hat) are also recommended for  sun protection.  - Call for new or changing lesions.  LENTIGINES, SEBORRHEIC KERATOSES, HEMANGIOMAS - Benign normal skin lesions - Benign-appearing - Call for any changes  MELANOCYTIC NEVI - Tan-brown and/or pink-flesh-colored symmetric macules and papules - Benign appearing on exam today - Observation - Call clinic for new or changing moles - Recommend daily use of broad spectrum spf 30+ sunscreen to sun-exposed areas.   ROSACEA Exam Mid face erythema with telangiectasias +/- scattered inflammatory papules   Rosacea is a chronic progressive skin condition usually affecting the face of adults, causing redness and/or acne bumps. It is treatable but not curable. It sometimes affects the eyes (ocular rosacea) as well. It may respond to topical and/or systemic medication and can flare with stress, sun exposure, alcohol, exercise, topical steroids (including hydrocortisone/cortisone 10) and some foods.  Daily application of broad spectrum spf 30+ sunscreen to face is recommended to reduce flares.    Treatment Plan Metronidazole 0.75% cream daily to face  ACNE SCAR Exam: Scar of nasal tip    INFLAMED SEBORRHEIC KERATOSIS (3) Right cheek (3) Symptomatic, irritating, patient would like treated.  Benign-appearing.  Call clinic for new or changing lesions.   Destruction of lesion - Right cheek (3) Complexity: simple   Destruction method: cryotherapy   Informed consent: discussed and consent obtained   Timeout:  patient name, date of birth, surgical site, and procedure verified Lesion destroyed using liquid nitrogen: Yes   Region frozen until ice ball extended beyond lesion: Yes   Outcome: patient tolerated procedure well with no complications   Post-procedure details: wound care instructions given   AK (ACTINIC KERATOSIS) Left Ear Destruction of lesion - Left Ear Complexity:  simple   Destruction method: cryotherapy   Informed consent: discussed and consent obtained    Timeout:  patient name, date of birth, surgical site, and procedure verified Lesion destroyed using liquid nitrogen: Yes   Region frozen until ice ball extended beyond lesion: Yes   Outcome: patient tolerated procedure well with no complications   Post-procedure details: wound care instructions given    Return in about 6 months (around 11/03/2023) for Rosacea follow up.  I, Joanie Coddington, CMA, am acting as scribe for [employer], DO .   Documentation: I have reviewed the above documentation for accuracy and completeness, and I agree with the above.  Langston Reusing, DO

## 2023-05-19 ENCOUNTER — Encounter: Payer: Self-pay | Admitting: Hematology

## 2023-05-19 ENCOUNTER — Ambulatory Visit: Payer: Medicare Other | Admitting: Internal Medicine

## 2023-05-19 ENCOUNTER — Ambulatory Visit: Payer: Self-pay | Admitting: Internal Medicine

## 2023-05-19 NOTE — Telephone Encounter (Addendum)
  Chief Complaint: N/V Symptoms: Diarrhea, cough, n/v, weakness, muscle aches, headache Frequency: Constant Pertinent Negatives: Patient denies fever, chest pain, SOB Disposition: [x] ED /[] Urgent Care (no appt availability in office) / [] Appointment(In office/virtual)/ []  [facility]/ [] Home Care/ [x] Refused Recommended Disposition /[]  Mobile Bus/ []  Follow-up with PCP Additional Notes: Patient called with complaints of n/v, weakness, muscle aches, dry cough, diarrhea, and headache a day after daughter tested positive for influenza A. Patient states most symptoms started last night, but mentioned that she began vomiting all day a few days ago. Patient has currently had diarrhea 3 times today and is going about 30 minutes after eating. Patient is a splenectomy patient, and states the last time she was able to keep anything down was about 4 or 5 days ago. Patient states that her mouth is dry and she is very weak, she denies fever at the present but states she has been taking tylenol around the clock. Patient says she has a headache that is unbearable without tylenol but moderate with treatment. Patient denies chest pain, SOB. Patient advised by this RN to go to the ER per protocol, to which patient states she is unsure if she will be able to make it due to her daughter being sick. Patient also needed her appt for today canceled. Patient advised by this RN note will be routed to office for PCP review and to call back or go to the ER with worsening symptoms. Patient verbalized understanding. CAL notified by phone.   Copied from CRM [id number]. Topic: Clinical - Red Word Triage >> May 19, 2023  9:13 AM Elle L wrote: Red Word that prompted transfer to Nurse Triage: The patient has diarreah, vomiting, cough, and a runny nose. Her daughter tested positive for the flu yesterday morning and her symptoms started yesterday evening. She was calling to reschedule her appointment but I did not  complete this due to her symptoms. Reason for Disposition  Patient sounds very sick or weak to the triager  Answer Assessment - Initial Assessment Questions 1. WORST SYMPTOM: "What is your worst symptom?" (e.g., cough, runny nose, muscle aches, headache, sore throat, fever)      Throwing up 2. ONSET: "When did your flu symptoms start?"      Last night,  3. COUGH: "How bad is the cough?"       Dry cough  4. RESPIRATORY DISTRESS: "Describe your breathing."      Denies 5. FEVER: "Do you have a fever?" If Yes, ask: "What is your temperature, how was it measured, and when did it start?"     Denies 6. EXPOSURE: "Were you exposed to someone with influenza?"       "My daughter tested positive for the flu yesterday" 7. FLU VACCINE: "Did you get a flu shot this year?"     Denies 8. HIGH RISK DISEASE: "Do you have any chronic medical problems?" (e.g., heart or lung disease, asthma, weak immune system, or other HIGH RISK conditions)     Spleen  9. PREGNANCY: "Is there any chance you are pregnant?" "When was your last menstrual period?"     Denies 10. OTHER SYMPTOMS: "Do you have any other symptoms?"  (e.g., runny nose, muscle aches, headache, sore throat)     Diarrhea (3x this AM), n/v, runny nose, sneezing, weak, muscle aches, headaches ("unbearable with tylenol and with it I can stand it")  Protocols used: Influenza (Flu) - [employer]

## 2023-05-20 NOTE — Telephone Encounter (Signed)
Lmtrc

## 2023-05-24 NOTE — Telephone Encounter (Signed)
Called patient she still sick and needs video visit .  No openings if cancellation will call get her added. Patient is aware

## 2023-06-01 ENCOUNTER — Encounter: Payer: Self-pay | Admitting: Hematology

## 2023-07-05 ENCOUNTER — Other Ambulatory Visit: Payer: Self-pay | Admitting: Gastroenterology

## 2023-07-05 DIAGNOSIS — K743 Primary biliary cirrhosis: Secondary | ICD-10-CM

## 2023-07-05 NOTE — Telephone Encounter (Signed)
 We have not seen her in over two years. Last prescribed B12 in 2023 by me. I recommend she request her PCP to recheck her B12 level and see if she still needs rx and if so get from them. She is actively following GI at [facility].

## 2023-07-05 NOTE — Telephone Encounter (Signed)
 It has been over two years since patient was seen by [facility], we sent to [facility] for help with chronic abd pain, PBC. She has actively been seen at GI at [facility] since 2022.   Would recommend she get refills from them. She should continue to follow them for her PBC. She is welcome to see [place] locally at any time that she needs but not necessary to see both of [place] for PBC.

## 2023-07-07 ENCOUNTER — Other Ambulatory Visit: Payer: Self-pay | Admitting: Internal Medicine

## 2023-07-14 ENCOUNTER — Other Ambulatory Visit: Payer: Self-pay | Admitting: Gastroenterology

## 2023-07-14 DIAGNOSIS — K743 Primary biliary cirrhosis: Secondary | ICD-10-CM

## 2023-07-14 NOTE — Telephone Encounter (Signed)
 Routing to you in leslie's absence

## 2023-07-15 ENCOUNTER — Telehealth: Payer: Self-pay | Admitting: Gastroenterology

## 2023-07-15 NOTE — Telephone Encounter (Signed)
 She isnt a pt of ours anymore she goes to another gi. See pt mychart message that I sent her. Appt needs cancelled, she needs to call her other gi.

## 2023-07-15 NOTE — Telephone Encounter (Signed)
 I scheduled patient an appt with Verlon Au for 4/21 and she said she has only been taking one pill vs 2 because she didn't want to run out and she already has red bumps.  She wanted to know if you could call in enough until she got to her appt.

## 2023-07-15 NOTE — Telephone Encounter (Signed)
 She sees GI at [facility]. She will need to schedule an appt with them.

## 2023-07-26 ENCOUNTER — Encounter: Payer: Self-pay | Admitting: Internal Medicine

## 2023-07-26 ENCOUNTER — Ambulatory Visit: Admitting: Gastroenterology

## 2023-07-26 ENCOUNTER — Ambulatory Visit (INDEPENDENT_AMBULATORY_CARE_PROVIDER_SITE_OTHER): Admitting: Internal Medicine

## 2023-07-26 VITALS — BP 96/62 | HR 66 | Ht 64.0 in | Wt 125.8 lb

## 2023-07-26 DIAGNOSIS — W57XXXA Bitten or stung by nonvenomous insect and other nonvenomous arthropods, initial encounter: Secondary | ICD-10-CM

## 2023-07-26 DIAGNOSIS — F332 Major depressive disorder, recurrent severe without psychotic features: Secondary | ICD-10-CM

## 2023-07-26 DIAGNOSIS — S80861A Insect bite (nonvenomous), right lower leg, initial encounter: Secondary | ICD-10-CM

## 2023-07-26 DIAGNOSIS — Z9081 Acquired absence of spleen: Secondary | ICD-10-CM

## 2023-07-26 DIAGNOSIS — Z01818 Encounter for other preprocedural examination: Secondary | ICD-10-CM | POA: Diagnosis not present

## 2023-07-26 DIAGNOSIS — K743 Primary biliary cirrhosis: Secondary | ICD-10-CM | POA: Diagnosis not present

## 2023-07-26 DIAGNOSIS — J4489 Other specified chronic obstructive pulmonary disease: Secondary | ICD-10-CM | POA: Diagnosis not present

## 2023-07-26 DIAGNOSIS — D849 Immunodeficiency, unspecified: Secondary | ICD-10-CM

## 2023-07-26 MED ORDER — DOXYCYCLINE HYCLATE 100 MG PO TABS: 100.0000 mg | ORAL_TABLET | Freq: Two times a day (BID) | ORAL | 0 refills | Status: DC

## 2023-07-26 MED ORDER — CARIPRAZINE HCL 1.5 MG PO CAPS: 1.5000 mg | ORAL_CAPSULE | Freq: Every day | ORAL | 5 refills | Status: DC

## 2023-07-26 NOTE — Assessment & Plan Note (Signed)
 S/p splenectomy Needs antibiotic prophylaxis before procedures Due to penicillin allergy, prescribed doxycycline 

## 2023-07-26 NOTE — Assessment & Plan Note (Addendum)
 Well controlled with Advair and as needed albuterol  Norel AD as needed for nasal congestion Promethazine  DM syrup as needed for cough
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
## 2023-07-26 NOTE — Assessment & Plan Note (Addendum)
 On Ursodiol  Needs to follow up with GI On vitamin supplements Vistaril  as needed for itching for now

## 2023-07-26 NOTE — Assessment & Plan Note (Signed)
 Planned to get dental procedure For dental cleaning, she does not need to stop Eliquis  for now She is medically optimized for dental procedure - needs to take antibiotic prophylaxis, considering her penicillin allergy, prescribed doxycycline 

## 2023-07-26 NOTE — Assessment & Plan Note (Signed)
 Uncontrolled, was on Effexor, Abilify and as needed Vistaril  in the past, still had persistent apathy and anxiety Had started amitriptyline , can also help with neuropathic pain - but had GI symptoms Started Vraylar  1.5 mg QD Sees BH therapist Used to be followed by [facility] in [place], but has stopped seeing them

## 2023-07-26 NOTE — Patient Instructions (Signed)
 Please start taking Doxycyline as prescribed.  Please continue to take medications as prescribed.  Please continue to follow heart healthy diet and ambulate as tolerated.

## 2023-07-26 NOTE — Assessment & Plan Note (Signed)
 On 11/19 for splenic laceration after a fall C/o abdominal pain intermittently, no constipation, diarrhea, melena or hematochezia - advised to discuss with GI and General surgeon Has received Meningococcal and Pneumococcal vaccine

## 2023-07-26 NOTE — Assessment & Plan Note (Signed)
Recent tick bite with unknown duration of attachment Started empiric doxycycline Advised to apply lotion or moisturizer for itching

## 2023-08-05 ENCOUNTER — Ambulatory Visit: Admitting: Adult Health

## 2023-08-05 ENCOUNTER — Encounter: Payer: Self-pay | Admitting: Adult Health

## 2023-08-05 VITALS — BP 107/66 | HR 84 | Ht 64.0 in | Wt 122.5 lb

## 2023-08-05 DIAGNOSIS — N644 Mastodynia: Secondary | ICD-10-CM

## 2023-08-05 DIAGNOSIS — R110 Nausea: Secondary | ICD-10-CM | POA: Diagnosis not present

## 2023-08-05 DIAGNOSIS — N911 Secondary amenorrhea: Secondary | ICD-10-CM

## 2023-08-05 NOTE — Progress Notes (Signed)
  Subjective:     Patient ID: Kara Stewart, female   DOB: 02-Apr-1976, 48 y.o.   MRN: [id number]  HPI Kara Stewart is a 48 year old white female,divorced, G3P1021 in complaining of no period since January, spotted brown in February, has nausea in am, and breast tenderness like when pregnant, she wonders if liver enzymes up.      Component Value Date/Time   DIAGPAP  02/12/2021 1024    - Negative for intraepithelial lesion or malignancy (NILM)   HPVHIGH Negative 02/12/2021 1024   ADEQPAP  02/12/2021 1024    Satisfactory for evaluation; transformation zone component PRESENT.    PCP is Dr Lydia Sams  Review of Systems No period since January, brown spotting in February Has nausea in am +breast tenderness like when pregnant Reviewed past medical,surgical, social and family history. Reviewed medications and allergies.     Objective:   Physical Exam BP 107/66 (BP Location: Right Arm, Patient Position: Sitting, Cuff Size: Normal)   Pulse 84   Ht 5\' 4"  (1.626 m)   Wt 122 lb 8 oz (55.6 kg)   BMI 21.03 kg/m  she forgot to catch urine for UPT   Skin warm and dry. Lungs: clear to ausculation bilaterally. Cardiovascular: regular rate and rhythm.   Upstream - 08/05/23 1620       Pregnancy Intention Screening   Does the patient want to become pregnant in the next year? No    Does the patient's partner want to become pregnant in the next year? No    Would the patient like to discuss contraceptive options today? No      Contraception Wrap Up   Current Method Female Sterilization    End Method Female Sterilization    Contraception Counseling Provided No             Assessment:     1. Amenorrhea, secondary (Primary) No period since January Had brown spotting in February Will check [facility], signed waiver  Will check FSH/LH - FSH/LH  2. Nausea Has nausea in am Will check CMP for liver enzymes  - Comprehensive metabolic panel with GFR  3. Breast tenderness +breast tenderness like when  pregnant     Plan:     Follow up prn

## 2023-08-06 LAB — COMPREHENSIVE METABOLIC PANEL WITH GFR
ALT: 12 IU/L (ref 0–32)
AST: 20 IU/L (ref 0–40)
Albumin: 4.3 g/dL (ref 3.9–4.9)
Alkaline Phosphatase: 93 IU/L (ref 44–121)
BUN/Creatinine Ratio: 7 — ABNORMAL LOW (ref 9–23)
BUN: 6 mg/dL (ref 6–24)
Bilirubin Total: 0.6 mg/dL (ref 0.0–1.2)
CO2: 25 mmol/L (ref 20–29)
Calcium: 9.9 mg/dL (ref 8.7–10.2)
Chloride: 101 mmol/L (ref 96–106)
Creatinine, Ser: 0.83 mg/dL (ref 0.57–1.00)
Globulin, Total: 2.8 g/dL (ref 1.5–4.5)
Glucose: 88 mg/dL (ref 70–99)
Potassium: 3.8 mmol/L (ref 3.5–5.2)
Sodium: 140 mmol/L (ref 134–144)
Total Protein: 7.1 g/dL (ref 6.0–8.5)
eGFR: 87 mL/min/{1.73_m2} (ref >59)

## 2023-08-06 LAB — FSH/LH
FSH: 101 m[IU]/mL
LH: 87.5 m[IU]/mL

## 2023-09-02 ENCOUNTER — Encounter: Payer: Self-pay | Admitting: Internal Medicine

## 2023-10-27 ENCOUNTER — Encounter: Payer: Self-pay | Admitting: Internal Medicine

## 2023-10-27 ENCOUNTER — Encounter: Admitting: Internal Medicine

## 2023-10-28 NOTE — Progress Notes (Signed)
 Patient reschedule appointment.  Please disregard.

## 2023-11-09 ENCOUNTER — Ambulatory Visit: Payer: Medicare Other | Admitting: Dermatology

## 2023-11-09 ENCOUNTER — Ambulatory Visit (INDEPENDENT_AMBULATORY_CARE_PROVIDER_SITE_OTHER): Admitting: Internal Medicine

## 2023-11-09 ENCOUNTER — Encounter: Payer: Self-pay | Admitting: Internal Medicine

## 2023-11-09 VITALS — BP 107/70 | HR 68 | Ht 64.5 in | Wt 116.2 lb

## 2023-11-09 DIAGNOSIS — F332 Major depressive disorder, recurrent severe without psychotic features: Secondary | ICD-10-CM

## 2023-11-09 DIAGNOSIS — W57XXXA Bitten or stung by nonvenomous insect and other nonvenomous arthropods, initial encounter: Secondary | ICD-10-CM

## 2023-11-09 DIAGNOSIS — S80861A Insect bite (nonvenomous), right lower leg, initial encounter: Secondary | ICD-10-CM

## 2023-11-09 DIAGNOSIS — F410 Panic disorder [episodic paroxysmal anxiety] without agoraphobia: Secondary | ICD-10-CM

## 2023-11-09 DIAGNOSIS — J4489 Other specified chronic obstructive pulmonary disease: Secondary | ICD-10-CM | POA: Diagnosis not present

## 2023-11-09 DIAGNOSIS — R413 Other amnesia: Secondary | ICD-10-CM

## 2023-11-09 DIAGNOSIS — F411 Generalized anxiety disorder: Secondary | ICD-10-CM

## 2023-11-09 MED ORDER — VENLAFAXINE HCL 37.5 MG PO TABS: 37.5000 mg | ORAL_TABLET | Freq: Two times a day (BID) | ORAL | 5 refills | Status: DC

## 2023-11-09 MED ORDER — DOXYCYCLINE HYCLATE 100 MG PO TABS: 100.0000 mg | ORAL_TABLET | Freq: Two times a day (BID) | ORAL | 0 refills | Status: DC

## 2023-11-09 NOTE — Patient Instructions (Signed)
 Please start taking Effexor  as prescribed.  Please start taking Doxycycline  for tick bite.  Please take Benadryl  as needed for itching.
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
## 2023-11-11 NOTE — Assessment & Plan Note (Signed)
 On Effexor  now Was on Abilify as well in the past, did not tolerate Vraylar  recently Has Vistaril  as needed Follow-up with [facility] therapist Relaxation techniques and deep breathing exercises as advised by [facility] therapist

## 2023-11-11 NOTE — Assessment & Plan Note (Addendum)
 Well controlled with Advair and as needed albuterol  Norel AD as needed for nasal congestion Mucinex  as needed for cough Needs to quit smoking

## 2023-11-11 NOTE — Assessment & Plan Note (Signed)
Recent tick bite with unknown duration of attachment Started empiric doxycycline Advised to apply lotion or moisturizer for itching

## 2023-11-11 NOTE — Assessment & Plan Note (Signed)
 Reports worsening of memory and cognitive abilities Has underlying MDD and GAD, which can also worsen cognitive abilities Undergoing treatment for MDD and GAD Referred to Neurology for further evaluation of memory concern

## 2023-11-11 NOTE — Assessment & Plan Note (Signed)
 Uncontrolled, was on Effexor , Abilify and as needed Vistaril  in the past, still had persistent apathy and anxiety Had started amitriptyline , can also help with neuropathic pain - but had GI symptoms Started Vraylar  1.5 mg QD, but did not tolerate it Restart Effexor  37.5 mg BID for now as she had some improvement with it in the past Sees [facility] therapist Used to be followed by [facility] in [place], but has stopped seeing them

## 2023-11-17 ENCOUNTER — Other Ambulatory Visit: Payer: Self-pay | Admitting: *Deleted

## 2024-01-28 ENCOUNTER — Other Ambulatory Visit: Payer: Self-pay

## 2024-01-28 DIAGNOSIS — E559 Vitamin D deficiency, unspecified: Secondary | ICD-10-CM

## 2024-01-28 DIAGNOSIS — D509 Iron deficiency anemia, unspecified: Secondary | ICD-10-CM

## 2024-01-28 DIAGNOSIS — D500 Iron deficiency anemia secondary to blood loss (chronic): Secondary | ICD-10-CM

## 2024-01-28 DIAGNOSIS — E538 Deficiency of other specified B group vitamins: Secondary | ICD-10-CM

## 2024-01-31 ENCOUNTER — Inpatient Hospital Stay: Attending: Oncology

## 2024-01-31 DIAGNOSIS — E538 Deficiency of other specified B group vitamins: Secondary | ICD-10-CM | POA: Diagnosis not present

## 2024-01-31 DIAGNOSIS — D72829 Elevated white blood cell count, unspecified: Secondary | ICD-10-CM | POA: Diagnosis not present

## 2024-01-31 DIAGNOSIS — D75839 Thrombocytosis, unspecified: Secondary | ICD-10-CM | POA: Insufficient documentation

## 2024-01-31 DIAGNOSIS — Z7901 Long term (current) use of anticoagulants: Secondary | ICD-10-CM | POA: Insufficient documentation

## 2024-01-31 DIAGNOSIS — E559 Vitamin D deficiency, unspecified: Secondary | ICD-10-CM | POA: Insufficient documentation

## 2024-01-31 DIAGNOSIS — D509 Iron deficiency anemia, unspecified: Secondary | ICD-10-CM | POA: Insufficient documentation

## 2024-01-31 DIAGNOSIS — Z86718 Personal history of other venous thrombosis and embolism: Secondary | ICD-10-CM | POA: Diagnosis not present

## 2024-01-31 DIAGNOSIS — D500 Iron deficiency anemia secondary to blood loss (chronic): Secondary | ICD-10-CM

## 2024-01-31 LAB — FERRITIN: Ferritin: 223 ng/mL (ref 11–307)

## 2024-01-31 LAB — CBC WITH DIFFERENTIAL/PLATELET
Abs Immature Granulocytes: 0.03 K/uL (ref 0.00–0.07)
Basophils Absolute: 0.2 K/uL — ABNORMAL HIGH (ref 0.0–0.1)
Basophils Relative: 2 %
Eosinophils Absolute: 0.4 K/uL (ref 0.0–0.5)
Eosinophils Relative: 4 %
HCT: 42.1 % (ref 36.0–46.0)
Hemoglobin: 13.6 g/dL (ref 12.0–15.0)
Immature Granulocytes: 0 %
Lymphocytes Relative: 46 %
Lymphs Abs: 4.6 K/uL — ABNORMAL HIGH (ref 0.7–4.0)
MCH: 31.8 pg (ref 26.0–34.0)
MCHC: 32.3 g/dL (ref 30.0–36.0)
MCV: 98.4 fL (ref 80.0–100.0)
Monocytes Absolute: 0.7 K/uL (ref 0.1–1.0)
Monocytes Relative: 7 %
Neutro Abs: 4.1 K/uL (ref 1.7–7.7)
Neutrophils Relative %: 41 %
Platelets: 329 K/uL (ref 150–400)
RBC: 4.28 MIL/uL (ref 3.87–5.11)
RDW: 12.7 % (ref 11.5–15.5)
WBC: 10 K/uL (ref 4.0–10.5)
nRBC: 0 % (ref 0.0–0.2)

## 2024-01-31 LAB — IRON AND TIBC
Iron: 103 ug/dL (ref 28–170)
Saturation Ratios: 39 % — ABNORMAL HIGH (ref 10.4–31.8)
TIBC: 266 ug/dL (ref 250–450)
UIBC: 163 ug/dL

## 2024-01-31 LAB — COMPREHENSIVE METABOLIC PANEL WITH GFR
ALT: 7 U/L (ref 0–44)
AST: 20 U/L (ref 15–41)
Albumin: 4.2 g/dL (ref 3.5–5.0)
Alkaline Phosphatase: 86 U/L (ref 38–126)
Anion gap: 8 (ref 5–15)
BUN: 8 mg/dL (ref 6–20)
CO2: 29 mmol/L (ref 22–32)
Calcium: 9.4 mg/dL (ref 8.9–10.3)
Chloride: 104 mmol/L (ref 98–111)
Creatinine, Ser: 0.7 mg/dL (ref 0.44–1.00)
GFR, Estimated: >60 mL/min (ref >60)
Glucose, Bld: 80 mg/dL (ref 70–99)
Potassium: 4 mmol/L (ref 3.5–5.1)
Sodium: 142 mmol/L (ref 135–145)
Total Bilirubin: 0.4 mg/dL (ref 0.0–1.2)
Total Protein: 6.7 g/dL (ref 6.5–8.1)

## 2024-01-31 LAB — FOLATE: Folate: 12.2 ng/mL

## 2024-01-31 LAB — VITAMIN B12: Vitamin B-12: 447 pg/mL (ref 180–914)

## 2024-02-01 ENCOUNTER — Ambulatory Visit (INDEPENDENT_AMBULATORY_CARE_PROVIDER_SITE_OTHER)

## 2024-02-01 VITALS — BP 92/60 | HR 70 | Resp 14 | Ht 64.0 in | Wt 123.0 lb

## 2024-02-01 DIAGNOSIS — Z1231 Encounter for screening mammogram for malignant neoplasm of breast: Secondary | ICD-10-CM | POA: Diagnosis not present

## 2024-02-01 DIAGNOSIS — Z0000 Encounter for general adult medical examination without abnormal findings: Secondary | ICD-10-CM

## 2024-02-01 NOTE — Patient Instructions (Signed)
 Kara Stewart,  Thank you for taking the time for your Medicare Wellness Visit. I appreciate your continued commitment to your health goals. Please review the care plan we discussed, and feel free to reach out if I can assist you further.  Medicare recommends these wellness visits once per year to help you and your care team stay ahead of potential health issues. These visits are designed to focus on prevention, allowing your provider to concentrate on managing your acute and chronic conditions during your regular appointments.  Please note that Annual Wellness Visits do not include a physical exam. Some assessments may be limited, especially if the visit was conducted virtually. If needed, we may recommend a separate in-person follow-up with your provider.  Ongoing Care  Seeing your primary care provider every 3 to 6 months helps us  monitor your health and provide consistent, personalized care.   Referrals   Your mammogram was scheduled for March 01, 2024 at 11:00 am. Remember do not put any lotions, powders, or deodorants on the morning of your screening.   Your mammogram will be on the mobile mammogram bus at [facility] in [place], in their parking lot Recommended Screenings:  Health Maintenance  Topic Date Due   COVID-19 Vaccine (1) Never done   Hepatitis B Vaccine (1 of 3 - 19+ 3-dose series) Never done   Pneumococcal Vaccine (2 of 2 - PPSV23, PCV20, or PCV21) 04/28/2020   Flu Shot  11/05/2023   Breast Cancer Screening  12/01/2023   Medicare Annual Wellness Visit  01/31/2025   Colon Cancer Screening  02/04/2025   Pap with HPV screening  02/12/2026   DTaP/Tdap/Td vaccine (2 - Td or Tdap) 03/10/2028   Hepatitis C Screening  Completed   HIV Screening  Completed   HPV Vaccine  Aged Out   Meningitis B Vaccine  Aged Out       02/01/2024   11:38 AM  Advanced Directives  Does Patient Have a Medical Advance Directive? No  Would patient like information on  creating a medical advance directive? Yes (MAU/Ambulatory/Procedural Areas - Information given)    Advance Care Planning is important because it: Ensures you receive medical care that aligns with your values, goals, and preferences. Provides guidance to your family and loved ones, reducing the emotional burden of decision-making during critical moments.  Vision: Annual vision screenings are recommended for early detection of glaucoma, cataracts, and diabetic retinopathy. These exams can also reveal signs of chronic conditions such as diabetes and high blood pressure.  Dental: Annual dental screenings help detect early signs of oral cancer, gum disease, and other conditions linked to overall health, including heart disease and diabetes.  Please see the attached documents for additional preventive care recommendations.

## 2024-02-01 NOTE — Progress Notes (Signed)
 Subjective:   Kara Stewart is a 48 y.o. who presents for a Medicare Wellness preventive visit.  As a reminder, Annual Wellness Visits don't include a physical exam, and some assessments may be limited, especially if this visit is performed virtually. We may recommend an in-person follow-up visit with your provider if needed.  Visit Complete: In person  Persons Participating in Visit: Patient.  AWV Questionnaire: No: Patient Medicare AWV questionnaire was not completed prior to this visit.  Cardiac Risk Factors include: advanced age (>24men, >70 women);smoking/ tobacco exposure     Objective:    Today's Vitals   02/01/24 1143  BP: 92/60  Pulse: 70  Resp: 14  SpO2: 97%  Weight: 123 lb (55.8 kg)  Height: 5' 4 (1.626 m)  PainSc: 8   PainLoc: Knee   Body mass index is 21.11 kg/m.     02/01/2024   11:38 AM 07/24/2022    1:24 PM 07/16/2022    2:31 PM 07/10/2022    8:34 AM 04/06/2022   12:57 PM 10/17/2021    2:01 PM 06/23/2021    8:39 AM  Advanced Directives  Does Patient Have a Medical Advance Directive? No No No No No No No  Would patient like information on creating a medical advance directive? Yes (MAU/Ambulatory/Procedural Areas - Information given) No - Patient declined No - Patient declined No - Patient declined   No - Patient declined    Current Medications (verified) Outpatient Encounter Medications as of 02/01/2024  Medication Sig   acetaminophen  (TYLENOL ) 500 MG tablet Take 1,000 mg by mouth every 6 (six) hours as needed.   albuterol  (VENTOLIN  HFA) 108 (90 Base) MCG/ACT inhaler Inhale 2 puffs into the lungs every 6 (six) hours as needed for wheezing or shortness of breath.   apixaban  (ELIQUIS ) 5 MG TABS tablet Take 1 tablet (5 mg total) by mouth 2 (two) times daily.   Cholecalciferol (VITAMIN D3) 125 MCG (5000 UT) CAPS Take 1 capsule (5,000 Units total) by mouth daily.   cyanocobalamin  (VITAMIN B12) 1000 MCG/ML injection ADMINISTER 1 ML(1000 MCG) IN THE MUSCLE  EVERY 30 DAYS   cyclobenzaprine  (FLEXERIL ) 5 MG tablet Take 1 tablet (5 mg total) by mouth 2 (two) times daily as needed for muscle spasms.   doxycycline  (VIBRA -TABS) 100 MG tablet Take 1 tablet (100 mg total) by mouth 2 (two) times daily.   fluticasone  (FLONASE ) 50 MCG/ACT nasal spray Place 2 sprays into both nostrils daily.   fluticasone -salmeterol (ADVAIR HFA) 115-21 MCG/ACT inhaler Inhale 2 puffs into the lungs 2 (two) times daily.   hydrOXYzine  (VISTARIL ) 25 MG capsule Take 1 capsule (25 mg total) by mouth every 8 (eight) hours as needed for itching.   metroNIDAZOLE  (METROCREAM ) 0.75 % cream Apply topically daily. Apply to face daily   Multiple Vitamins-Minerals (AIRBORNE GUMMIES PO) Take by mouth.   prednisoLONE  acetate (PRED FORTE ) 1 % ophthalmic suspension Place into the left eye.   pregabalin  (LYRICA ) 50 MG capsule TAKE 1 CAPSULE(50 MG) BY MOUTH TWICE DAILY   ursodiol  (ACTIGALL ) 500 MG tablet TAKE 1 TABLET BY MOUTH TWICE DAILY   valACYclovir (VALTREX) 1000 MG tablet Take 1,000 mg by mouth daily.   venlafaxine  (EFFEXOR ) 37.5 MG tablet Take 1 tablet (37.5 mg total) by mouth 2 (two) times daily with a meal.   No facility-administered encounter medications on file as of 02/01/2024.    Allergies (verified) Amoxicillin-pot clavulanate, Penicillins, Avelox  [moxifloxacin ], and Lidocaine    History: Past Medical History:  Diagnosis Date   Alcohol abuse,  in remission    Alcoholic hepatitis with ascites (HCC) 10/05/2016   Has continued to abstain from ETOH since May 2018, known severe hepatic steatosis, HSM, possible early cirrhosis. Treating with URSO  due to elevated AMA. LFTs slowly improving. Concern for recurrent non-tense ascites on exam: patient feels uncomfortable and requesting paracentesis. This is likely   Asthma due to seasonal allergies 04/17/2020   Auditory perceptual disorder    Back pain    Chronic liver disease 11/16/2017   Deep vein thrombosis of portal vein 03/06/2020    Dysphagia 05/29/2020   Fibromyalgia    Generalized anxiety disorder with panic attacks    GERD (gastroesophageal reflux disease) 07/21/2012   Hiatal hernia    History of alcohol abuse 04/26/2015   History of colitis 11/29/2014   History of marijuana use 01/20/2015   Hypertriglyceridemia    Hypothyroid    Iritis    frequent   Iron  deficiency anemia 05/13/2020   Irregular periods 01/11/2014   Macrocytic anemia 03/06/2020   Major depressive disorder 09/10/2020   Ovarian cyst    Polyarthralgia 03/06/2020   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   PTSD (post-traumatic stress disorder) 09/10/2020   S/P closed fracture of wrist 03/06/2020   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   SVT (supraventricular tachycardia)    last issue 12/2017   Thyroid  nodule 01/11/2014   Past Surgical History:  Procedure Laterality Date   ABLATION  03/10/2023   BIOPSY N/A 02/05/2015   Procedure: BIOPSY;  Surgeon: Margo LITTIE Haddock, MD;  Location: [facility];  Service: Endoscopy;  Laterality: N/A;   BIOPSY STOMACH  08/2021   BREAST BIOPSY Left 2023   Benign breast tissue with focal fibrosis   COLONOSCOPY     COLONOSCOPY WITH PROPOFOL  N/A 02/05/2015   DOQ:[facility] HH/mild diverticulosis   ESOPHAGEAL DILATION N/A 11/21/2015   Procedure: ESOPHAGEAL DILATION;  Surgeon: Lamar CHRISTELLA Hollingshead, MD;  Location: [facility];  Service: Endoscopy;  Laterality: N/A;   ESOPHAGOGASTRODUODENOSCOPY  11/06/2011   [name]: MILD Esophagitis/PATENT ESOPHAGEAL Stricture/  Moderate gastritis. Bx no.hpylori or celiac, +gastritis   ESOPHAGOGASTRODUODENOSCOPY (EGD) WITH PROPOFOL  N/A 03/20/2014   [name]: 1. Mild esophagitis & distal esophagela stricture. 2. small hiatal hernia 3. moderate non-erosive gastritis and mild duodenits   ESOPHAGOGASTRODUODENOSCOPY (EGD) WITH PROPOFOL  N/A 11/21/2015   Dr. Hollingshead: LA grade B esophagitis, MW tear likely source of hematemesis   LAPAROSCOPIC TUBAL LIGATION Bilateral 09/14/2018   Procedure: LAPAROSCOPIC TUBAL LIGATION;   Surgeon: Starla Harland BROCKS, MD;  Location: [facility];  Service: Gynecology;  Laterality: Bilateral;   LIVER BIOPSY  02/23/2020   Procedure: OPEN LIVER BIOPSY;  Surgeon: Dasie Leonor LITTIE, MD;  Location: [facility];  Service: General;;   SAVORY DILATION N/A 03/20/2014   Procedure: SAVORY DILATION;  Surgeon: Margo LITTIE Haddock, MD;  Location: [facility];  Service: Endoscopy;  Laterality: N/A;  dilated with # 12.8, 14,15,16   SPLENECTOMY, TOTAL N/A 02/23/2020   Procedure: SPLENECTOMY;  Surgeon: Dasie Leonor LITTIE, MD;  Location: [facility];  Service: General;  Laterality: N/A;   TOOTH EXTRACTION     Family History  Problem Relation Age of Onset   Stomach cancer Paternal Grandfather        colon cancer   Cancer Paternal Grandfather        throat and esophagus   Other Paternal Grandmother        hernia   COPD Paternal Grandmother    Diabetes Paternal Grandmother    Breast cancer Maternal Grandmother    Cancer  Maternal Grandmother        skin   Anxiety disorder Maternal Grandmother    Dementia Maternal Grandmother    Diabetes Maternal Grandfather    Heart disease Maternal Grandfather    Dementia Maternal Grandfather    Other Father        varicose veins; stomach issues; hernia   Hypertension Father    Hyperlipidemia Father    Cancer Father        prostate; skin   Arthritis Father        rheumatoid   Neuropathy Father    Rheum arthritis Father    Cancer Mother        skin   Breast cancer Mother    Anxiety disorder Mother    Hypertension Mother    Other Mother        fatty liver   Hyperlipidemia Mother    Liver disease Mother        fatty liver, does not drink.    Sleep apnea Mother    Other Brother        hernia   Thyroid  disease Sister    Hypertension Sister    Healthy Daughter    Social History   Socioeconomic History   Marital status: Divorced    Spouse name: Not on file   Number of children: 1   Years of education: 16   Highest education level: Some college, no degree   Occupational History   Occupation: Unemployed  Tobacco Use   Smoking status: Some Days    Current packs/day: 0.25    Average packs/day: 0.3 packs/day for 6.1 years (1.5 ttl pk-yrs)    Types: Cigarettes    Start date: 12/22/2017    Passive exposure: Never   Smokeless tobacco: Never   Tobacco comments:    2 cigs per day  Vaping Use   Vaping status: Never Used  Substance and Sexual Activity   Alcohol use: No    Alcohol/week: 0.0 standard drinks of alcohol    Comment: Sober since 2018   Drug use: Yes    Types: Marijuana    Comment: CBD oil   Sexual activity: Yes    Birth control/protection: Surgical    Comment: tubal  Other Topics Concern   Not on file  Social History Narrative   Lives alone with a roommate. Dating and in safe relationship.     Previous MD: Jenkins Kerns, NP ([place], [place])   Right handed   Drinks caffeine   One story home   Social Drivers of Health   Financial Resource Strain: High Risk (02/01/2024)   Overall Financial Resource Strain (CARDIA)    Difficulty of Paying Living Expenses: Very hard  Food Insecurity: Food Insecurity Present (02/01/2024)   Hunger Vital Sign    Worried About Running Out of Food in the Last Year: Often true    Ran Out of Food in the Last Year: Often true  Transportation Needs: No Transportation Needs (02/01/2024)   PRAPARE - Administrator, Civil Service (Medical): No    Lack of Transportation (Non-Medical): No  Physical Activity: Sufficiently Active (02/01/2024)   Exercise Vital Sign    Days of Exercise per Week: 7 days    Minutes of Exercise per Session: 30 min  Stress: Patient Declined (02/01/2024)   [employer] of Occupational Health - Occupational Stress Questionnaire    Feeling of Stress: Patient declined  Social Connections: Moderately Isolated (02/01/2024)   Social Connection and Isolation Panel  Frequency of Communication with Friends and Family: More than three times a week    Frequency of Social  Gatherings with Friends and Family: Once a week    Attends Religious Services: More than 4 times per year    Active Member of [employer] or Organizations: No    Attends Engineer, Structural: Never    Marital Status: Divorced    Tobacco Counseling Ready to quit: Yes Counseling given: Yes Tobacco comments: 2 cigs per day    Clinical Intake:  Pre-visit preparation completed: Yes  Pain : 0-10 Pain Score: 8  Pain Type: Chronic pain Pain Location: Knee Pain Orientation: Right, Left Pain Descriptors / Indicators: Constant Pain Onset: More than a month ago Pain Frequency: Constant     BMI - recorded: 21.11 Nutritional Status: BMI of 19-24  Normal Nutritional Risks: None Diabetes: No  No results found for: HGBA1C   How often do you need to have someone help you when you read instructions, pamphlets, or other written materials from your doctor or pharmacy?: 1 - Never  Interpreter Needed?: No  Information entered by :: Denzal Meir W CMA (AAMA)   Activities of Daily Living     02/01/2024   11:56 AM  In your present state of health, do you have any difficulty performing the following activities:  Hearing? 0  Vision? 0  Difficulty concentrating or making decisions? 0  Walking or climbing stairs? 0  Dressing or bathing? 0  Doing errands, shopping? 0  Preparing Food and eating ? N  Using the Toilet? N  In the past six months, have you accidently leaked urine? N  Do you have problems with loss of bowel control? N  Managing your Medications? N  Managing your Finances? N  Housekeeping or managing your Housekeeping? N    Patient Care Team: Tobie Suzzane POUR, MD as PCP - General (Internal Medicine) Cindie Carlin POUR, DO as Consulting Physician (Internal Medicine) Skeet Juliene SAUNDERS, DO as Consulting Physician (Neurology) Signa Delon LABOR, NP as Nurse Practitioner (Obstetrics and Gynecology) Neysa Clarita RAMAN, LPN Maree Paticia BRAVO, MD as Referring Physician  (Ophthalmology) Dziwis, Delon Garre, MD as Consulting Physician (Gastroenterology) Shirlean Therisa ORN, NP as Chaplain (Gastroenterology) Dante Winton BRAVO, LCSW as Social Worker (Psychiatry) Onesimo Oneil LABOR, MD as Consulting Physician (Orthopedic Surgery) Jayne Vonn DEL, MD as Consulting Physician (Obstetrics and Gynecology)  I have updated your Care Teams any recent Medical Services you may have received from other providers in the past year.     Assessment:   This is a routine wellness examination for [place].  Hearing/Vision screen Hearing Screening - Comments:: Patient denies any hearing difficulties.   Vision Screening - Comments:: Wears rx glasses - up to date with routine eye exams with  Dr. Maree w/ [facility]   Goals Addressed               This Visit's Progress     I want to stop smoking (pt-stated)          Depression Screen     02/01/2024   11:58 AM 11/09/2023    9:56 AM 10/27/2023   10:57 AM 07/26/2023    2:04 PM 07/26/2023    1:57 PM 02/15/2023    2:35 PM 12/15/2022    2:39 PM  PHQ 2/9 Scores  PHQ - 2 Score 0 0 0 4 0 5 4  PHQ- 9 Score 0 0 0 20 0 22 22     Fall Risk  02/01/2024   11:53 AM 07/26/2023    1:56 PM 02/15/2023    2:34 PM 12/15/2022    2:39 PM 08/11/2022    1:10 PM  Fall Risk   Falls in the past year? 0 0 1 1 1   Number falls in past yr: 0 0 1 1 1   Injury with Fall? 0 0 0 1 1  Risk for fall due to : No Fall Risks No Fall Risks No Fall Risks Impaired balance/gait   Follow up Falls evaluation completed;Education provided;Falls prevention discussed Falls evaluation completed Falls evaluation completed Falls evaluation completed     MEDICARE RISK AT HOME:  Medicare Risk at Home Any stairs in or around the home?: Yes If so, are there any without handrails?: No Adequate lighting in your home to reduce risk of falls?: Yes Life alert?: No Use of a cane, walker or w/c?: No Grab bars in the bathroom?: No Shower chair or bench in shower?:  No Elevated toilet seat or a handicapped toilet?: Yes  TIMED UP AND GO:  Was the test performed?  Yes  Length of time to ambulate 10 feet: 5 sec Gait steady and fast without use of assistive device  Cognitive Function: 6CIT completed        02/01/2024   11:57 AM  6CIT Screen  What Year? 0 points  What month? 0 points  What time? 0 points  Count back from 20 0 points  Months in reverse 0 points  Repeat phrase 0 points  Total Score 0 points    Immunizations Immunization History  Administered Date(s) Administered   HIB (PRP-T) 03/03/2020   Influenza,inj,Quad PF,6+ Mos 01/11/2018, 04/17/2020   Influenza-Unspecified 01/12/2014   Meningococcal Mcv4o 03/03/2020   Pneumococcal Conjugate-13 03/03/2020   Tdap 03/10/2018    Screening Tests Health Maintenance  Topic Date Due   COVID-19 Vaccine (1) Never done   Hepatitis B Vaccines 19-59 Average Risk (1 of 3 - 19+ 3-dose series) Never done   Pneumococcal Vaccine (2 of 2 - PPSV23, PCV20, or PCV21) 04/28/2020   Influenza Vaccine  11/05/2023   Mammogram  12/01/2023   Medicare Annual Wellness (AWV)  01/31/2025   Colonoscopy  02/04/2025   Cervical Cancer Screening (HPV/Pap Cotest)  02/12/2026   DTaP/Tdap/Td (2 - Td or Tdap) 03/10/2028   Hepatitis C Screening  Completed   HIV Screening  Completed   HPV VACCINES  Aged Out   Meningococcal B Vaccine  Aged Out    Health Maintenance Health Maintenance Due  Topic Date Due   COVID-19 Vaccine (1) Never done   Hepatitis B Vaccines 19-59 Average Risk (1 of 3 - 19+ 3-dose series) Never done   Pneumococcal Vaccine (2 of 2 - PPSV23, PCV20, or PCV21) 04/28/2020   Influenza Vaccine  11/05/2023   Mammogram  12/01/2023   Health Maintenance Items Addressed: Mammogram ordered and scheduled for the mobile mammogram bus at [facility] declined flu vaccination Additional Screening:  Vision Screening: Recommended annual ophthalmology exams for early detection of glaucoma and other disorders of the  eye. Would you like a referral to an eye doctor? No    Dental Screening: Recommended annual dental exams for proper oral hygiene  Community Resource Referral / Chronic Care Management: CRR required this visit?  Yes  resources printed and mailed to patient  CCM required this visit?  No   Plan:    I have personally reviewed and noted the following in the patient's chart:   Medical and social history Use of alcohol,  tobacco or illicit drugs  Current medications and supplements including opioid prescriptions. Patient is not currently taking opioid prescriptions. Functional ability and status Nutritional status Physical activity Advanced directives List of other physicians Hospitalizations, surgeries, and ER visits in previous 12 months Vitals Screenings to include cognitive, depression, and falls Referrals and appointments  In addition, I have reviewed and discussed with patient certain preventive protocols, quality metrics, and best practice recommendations. A written personalized care plan for preventive services as well as general preventive health recommendations were provided to patient.   Taren Toops, CMA   02/01/2024   After Visit Summary: (In Person-Printed) AVS printed and given to the patient  Notes: Nothing significant to report at this time.

## 2024-02-07 LAB — METHYLMALONIC ACID, SERUM: Methylmalonic Acid, Quantitative: 107 nmol/L (ref 0–378)

## 2024-02-08 ENCOUNTER — Inpatient Hospital Stay: Attending: Oncology | Admitting: Oncology

## 2024-02-08 DIAGNOSIS — I81 Portal vein thrombosis: Secondary | ICD-10-CM | POA: Insufficient documentation

## 2024-02-08 DIAGNOSIS — N920 Excessive and frequent menstruation with regular cycle: Secondary | ICD-10-CM | POA: Insufficient documentation

## 2024-02-08 DIAGNOSIS — D72829 Elevated white blood cell count, unspecified: Secondary | ICD-10-CM | POA: Insufficient documentation

## 2024-02-08 DIAGNOSIS — Z7901 Long term (current) use of anticoagulants: Secondary | ICD-10-CM | POA: Insufficient documentation

## 2024-02-08 DIAGNOSIS — E538 Deficiency of other specified B group vitamins: Secondary | ICD-10-CM | POA: Insufficient documentation

## 2024-02-08 DIAGNOSIS — D500 Iron deficiency anemia secondary to blood loss (chronic): Secondary | ICD-10-CM | POA: Insufficient documentation

## 2024-02-10 ENCOUNTER — Ambulatory Visit (INDEPENDENT_AMBULATORY_CARE_PROVIDER_SITE_OTHER): Admitting: Internal Medicine

## 2024-02-10 ENCOUNTER — Encounter: Payer: Self-pay | Admitting: Internal Medicine

## 2024-02-10 VITALS — BP 102/66 | HR 74 | Ht 64.5 in | Wt 121.4 lb

## 2024-02-10 DIAGNOSIS — R55 Syncope and collapse: Secondary | ICD-10-CM

## 2024-02-10 DIAGNOSIS — I81 Portal vein thrombosis: Secondary | ICD-10-CM | POA: Diagnosis not present

## 2024-02-10 DIAGNOSIS — I8393 Asymptomatic varicose veins of bilateral lower extremities: Secondary | ICD-10-CM | POA: Diagnosis not present

## 2024-02-10 DIAGNOSIS — I950 Idiopathic hypotension: Secondary | ICD-10-CM | POA: Diagnosis not present

## 2024-02-10 DIAGNOSIS — B009 Herpesviral infection, unspecified: Secondary | ICD-10-CM

## 2024-02-10 DIAGNOSIS — F332 Major depressive disorder, recurrent severe without psychotic features: Secondary | ICD-10-CM

## 2024-02-10 DIAGNOSIS — J0111 Acute recurrent frontal sinusitis: Secondary | ICD-10-CM

## 2024-02-10 MED ORDER — VALACYCLOVIR HCL 1 G PO TABS: 1000.0000 mg | ORAL_TABLET | Freq: Three times a day (TID) | ORAL | 2 refills | Status: AC

## 2024-02-10 MED ORDER — AZITHROMYCIN 250 MG PO TABS: ORAL_TABLET | ORAL | 0 refills | Status: AC

## 2024-02-10 NOTE — Assessment & Plan Note (Signed)
Started empiric azithromycin since she has persistent symptoms despite symptomatic treatment Advised to avoid daily use of Sudafed - no more than 5 days in a row Norel AD as needed for nasal congestion Flonase for allergies

## 2024-02-10 NOTE — Assessment & Plan Note (Signed)
 Better controlled currently, was on Effexor , Abilify and as needed Vistaril  in the past, still had persistent apathy and anxiety Has tried amitriptyline , can also help with neuropathic pain - but had GI symptoms Did not tolerate Vraylar  1.5 mg QD Sees BH therapist Used to be followed by [facility] in [place], but has stopped seeing them

## 2024-02-10 NOTE — Assessment & Plan Note (Addendum)
 Likely due to orthostatic hypotension EKG: Sinus rhythm.  No signs of active ischemia. Advised to maintain at least 64 ounces of fluid intake in a day Referred to Cardiology for further evaluation

## 2024-02-10 NOTE — Assessment & Plan Note (Addendum)
 Has had episodes of syncope Advised to maintain at least 64 ounces of fluid intake in a day Referred to Cardiology for further evaluation

## 2024-02-10 NOTE — Patient Instructions (Addendum)
 Please start taking Azithromycin  as prescribed. Please use nasal saline spray as needed for nasal congestion.  Please continue to take other medications as prescribed.  Please continue to follow high protein diet and perform moderate exercise/walking as tolerated.  Please maintain at least 64 ounces of fluid intake in a day.   Neuro Referral:  [facility] [address] [facility] [place] [address] [phone]

## 2024-02-10 NOTE — Assessment & Plan Note (Signed)
 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

## 2024-02-10 NOTE — Assessment & Plan Note (Addendum)
 Followed by [place] vein specialist Her leg pain is not due to varicose veins DPA pulse 2+, less likely to be vascular etiology pain

## 2024-02-10 NOTE — Assessment & Plan Note (Signed)
On Eliquis F/u with GI and Heme/Onc. 
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
## 2024-02-23 ENCOUNTER — Inpatient Hospital Stay: Admitting: Oncology

## 2024-02-23 VITALS — BP 111/79 | HR 65 | Temp 98.6°F | Resp 18 | Ht 64.5 in | Wt 119.0 lb

## 2024-02-23 DIAGNOSIS — D72829 Elevated white blood cell count, unspecified: Secondary | ICD-10-CM | POA: Diagnosis not present

## 2024-02-23 DIAGNOSIS — E559 Vitamin D deficiency, unspecified: Secondary | ICD-10-CM

## 2024-02-23 DIAGNOSIS — N920 Excessive and frequent menstruation with regular cycle: Secondary | ICD-10-CM | POA: Diagnosis not present

## 2024-02-23 DIAGNOSIS — Z7901 Long term (current) use of anticoagulants: Secondary | ICD-10-CM | POA: Diagnosis not present

## 2024-02-23 DIAGNOSIS — D500 Iron deficiency anemia secondary to blood loss (chronic): Secondary | ICD-10-CM | POA: Diagnosis not present

## 2024-02-23 DIAGNOSIS — I81 Portal vein thrombosis: Secondary | ICD-10-CM

## 2024-02-23 DIAGNOSIS — D509 Iron deficiency anemia, unspecified: Secondary | ICD-10-CM

## 2024-02-23 DIAGNOSIS — E538 Deficiency of other specified B group vitamins: Secondary | ICD-10-CM

## 2024-02-23 NOTE — Progress Notes (Signed)
 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  She had splenectomy as well as liver biopsy that showed steatohepatitis.  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 - Hypercoagulable work-up was negative - She is stable on Eliquis , reports that she has not missed any doses since her last appointment - Denies easy bruising.  No major blood loss. - Patient is interested in coming off of Eliquis . -Patient reports she has had several breaks in her Eliquis  due to various procedures without recurrent clot. -Discussed case with Dr. Davonna who believes portal vein thrombus was provoked from fall splenic rupture.  Patient is interested in coming off of Eliquis  and we discussed stopping Eliquis  with repeat D-dimer in 1 month.  If D-dimer is negative, patient can continue off of Eliquis .  Iron  deficiency anemia due to chronic blood loss - She has chronic menorrhagia.  No symptoms of GI blood loss. - EGD (11/21/2015): Mallory-Weiss tear, likely cause of hematemesis - Colonoscopy (02/05/2015): Mild diverticulosis and small internal hemorrhoids - She was unable to tolerate oral iron  tablets.  Most recent Feraheme  12/17/2020 - Her significant fatigue is multifactorial.   - Most recent labs 01/31/2024 show hemoglobin of 13.6, ferritin 223, B12 447 and iron  saturation 39%. -Patient feels symptomatic with severe fatigue and weakness. -She last received IV iron  in April 2024.  She denies any melena, hematochezia or bright red blood per rectum. -We discussed 1 dose of iron  and 1 B12 shot to see if this helps her symptoms. -Return to clinic in 3 months for repeat  nutritional labs. 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  -Labs from 01/31/2024 showed normal white blood cell count 10.0 with mildly elevated lymphocytes at 4.6.  Normal platelet count. -No additional workup needed at this time. B12 deficiency -MMA normal, B12 447. -Will go ahead and give 1 B12 shot.  Start B12 supplements 1000 mcg daily.  Orders Placed This Encounter  Procedures   CBC with Differential    Standing Status:   Future    Expected Date:   05/26/2024    Expiration Date:   08/24/2024   Comprehensive metabolic panel    Standing Status:   Future    Expected Date:   05/26/2024    Expiration Date:   08/24/2024   Ferritin    Standing Status:   Future    Expected Date:   05/26/2024    Expiration Date:   08/24/2024   Vitamin B12    Standing Status:   Future    Expected Date:   05/26/2024    Expiration Date:   08/24/2024   Iron  and TIBC ([facility] ONLY)    Standing Status:   Future    Expected Date:   05/26/2024    Expiration Date:   08/24/2024   D-dimer, quantitative    Standing Status:   Future    Expected Date:   03/25/2024    Expiration Date:   02/23/2025   Ambulatory referral to Gastroenterology    Referral Priority:   Routine    Referral Type:   Consultation    Referral Reason:   Specialty Services Required  Number of Visits Requested:   1    INTERVAL HISTORY: Patient returns for follow-up.  Patient has not been seen in our clinic in quite some time due to missed appointments.  She presents back for follow-up due to severe fatigue and overall not feeling well.  She has not had labs collected and PCP recommended she see us  back.  Reports similar to how she felt when she was iron  deficient.  Reports a low appetite and weight loss.  States she has to force herself to eat.  She has developed right lower abdominal pain.  She has shortness of breath, chest pain, palpitations, loose  stools, nausea at times, abnormal vaginal bleeding, dizziness, headache, numbness and burning and insomnia.  Reports she continues Eliquis  but is wondering if she can come off of this.  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  Patient has not recently had a colonoscopy last was in 2016.  Denies any bright red blood per rectum, melena or hematochezia.  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  She underwent EUS negative for varices and demonstrated no exophytic lesion but did show a perigastric lymph node.  Repeat CT scan showed no apparent gastric mass.  Abdominal ultrasound (2024) showed no evidence of ascites hydrogen breath test for postprandial nausea was negative.  She was started on Colesytramine, hydroxyzine   along with multiple topicals.  Patient needs updated colonoscopy.  She is on Ursodiol  for primary biliary cholangitis.  Patient has history of fibromyalgia, osteoarthritis and is followed by rheumatology.  She is currently on Humira.  Patient has chronic fatigue, PTSD and MDD.  She has tried several medications and was previously seen by psychiatry currently not being followed.  We reviewed CBC, CMP, ferritin, vitamin B, iron  panel, MMA and folate level.  She is currently not taking any supplements.  SUMMARY OF HEMATOLOGIC HISTORY: Oncology History   No history exists.     CBC    Component Value Date/Time   WBC 10.0 01/31/2024 1042   RBC 4.28 01/31/2024 1042   HGB 13.6 01/31/2024 1042   HGB 13.4 02/15/2023 1524   HCT 42.1 01/31/2024 1042   HCT 40.1 02/15/2023 1524   PLT 329 01/31/2024 1042   PLT 353 02/15/2023 1524   MCV 98.4 01/31/2024 1042   MCV 93 02/15/2023 1524   MCH 31.8 01/31/2024 1042   MCHC 32.3 01/31/2024 1042   RDW 12.7 01/31/2024 1042   RDW 12.0 02/15/2023 1524   LYMPHSABS 4.6 (H) 01/31/2024 1042   LYMPHSABS 5.4 (H) 02/15/2023 1524   MONOABS  0.7 01/31/2024 1042   EOSABS 0.4 01/31/2024 1042   EOSABS 0.6 (H) 02/15/2023 1524   BASOSABS 0.2 (H) 01/31/2024 1042   BASOSABS 0.1 02/15/2023 1524       Latest Ref Rng & Units 01/31/2024   10:42 AM 08/05/2023    4:44 PM 02/15/2023    3:24 PM  CMP  Glucose 70 - 99 mg/dL 80  88  97   BUN 6 - 20 mg/dL 8  6  6    Creatinine 0.44 - 1.00 mg/dL 9.29  9.16  9.37   Sodium 135 - 145 mmol/L 142  140  142   Potassium 3.5 - 5.1 mmol/L 4.0  3.8  3.8   Chloride 98 - 111 mmol/L 104  101  104   CO2 22 - 32 mmol/L 29  25  22  Calcium 8.9 - 10.3 mg/dL 9.4  9.9  9.3   Total Protein 6.5 - 8.1 g/dL 6.7  7.1  6.6   Total Bilirubin 0.0 - 1.2 mg/dL 0.4  0.6  0.4   Alkaline Phos 38 - 126 U/L 86  93  102   AST 15 - 41 U/L 20  20  17    ALT 0 - 44 U/L 7  12  8       Lab Results  Component Value Date   FERRITIN 223 01/31/2024   VITAMINB12 447 01/31/2024    Vitals:   02/23/24 1117  BP: 111/79  Pulse: 65  Resp: 18  Temp: 98.6 F (37 C)  SpO2: 100%    Review of System:  Review of Systems  Constitutional:  Positive for malaise/fatigue and weight loss.  Cardiovascular:  Positive for chest pain and palpitations.  Gastrointestinal:  Positive for nausea and vomiting.  Neurological:  Positive for dizziness, sensory change and headaches.  Psychiatric/Behavioral:  The patient is nervous/anxious and has insomnia.     Physical Exam: Physical Exam Constitutional:      Appearance: Normal appearance.  HENT:     Head: Normocephalic and atraumatic.  Eyes:     Pupils: Pupils are equal, round, and reactive to light.  Cardiovascular:     Rate and Rhythm: Normal rate and regular rhythm.     Heart sounds: Normal heart sounds. No murmur heard. Pulmonary:     Effort: Pulmonary effort is normal.     Breath sounds: Normal breath sounds. No wheezing.  Abdominal:     General: Bowel sounds are normal. There is no distension.     Palpations: Abdomen is soft.     Tenderness: There is no abdominal tenderness.   Musculoskeletal:        General: Normal range of motion.     Cervical back: Normal range of motion.  Skin:    General: Skin is warm and dry.     Findings: No rash.  Neurological:     Mental Status: She is alert and oriented to person, place, and time.     Gait: Gait is intact.  Psychiatric:        Mood and Affect: Mood and affect normal.        Cognition and Memory: Memory normal.        Judgment: Judgment normal.      I spent 25 minutes dedicated to the care of this patient (face-to-face and non-face-to-face) on the date of the encounter to include what is described in the assessment and plan.,  Delon Hope, NP 02/28/2024 9:48 AM

## 2024-02-23 NOTE — Assessment & Plan Note (Addendum)
-   She has chronic menorrhagia.  No symptoms of GI blood loss. - EGD (11/21/2015): Mallory-Weiss tear, likely cause of hematemesis - Colonoscopy (02/05/2015): Mild diverticulosis and small internal hemorrhoids - She was unable to tolerate oral iron  tablets.  Most recent Feraheme  12/17/2020 - Her significant fatigue is multifactorial.   - Most recent labs 01/31/2024 show hemoglobin of 13.6, ferritin 223, B12 447 and iron  saturation 39%. -Patient feels symptomatic with severe fatigue and weakness. -She last received IV iron  in April 2024.  She denies any melena, hematochezia or bright red blood per rectum. -We discussed 1 dose of iron  and 1 B12 shot to see if this helps her symptoms. -Return to clinic in 3 months for repeat nutritional labs.

## 2024-02-23 NOTE — Assessment & Plan Note (Addendum)
-   November 2021, patient fell and was found to have splenic rupture with thrombus in the main portal vein and central intrahepatic vein and branches, with edema around the pancreas, as well as fatty liver with early cirrhosis.  She had splenectomy as well as liver biopsy that showed steatohepatitis.  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 - Hypercoagulable work-up was negative - She is stable on Eliquis , reports that she has not missed any doses since her last appointment - Denies easy bruising.  No major blood loss. - Patient is interested in coming off of Eliquis . -Patient reports she has had several breaks in her Eliquis  due to various procedures without recurrent clot. -Discussed case with Dr. Davonna who believes portal vein thrombus was provoked from fall splenic rupture.  Patient is interested in coming off of Eliquis  and we discussed stopping Eliquis  with repeat D-dimer in 1 month.  If D-dimer is negative, patient can continue off of Eliquis .

## 2024-02-23 NOTE — Assessment & Plan Note (Addendum)
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  Normal platelet count. -No additional workup needed at this time.

## 2024-02-25 ENCOUNTER — Inpatient Hospital Stay

## 2024-02-25 VITALS — BP 101/63 | HR 67 | Temp 96.2°F | Resp 18

## 2024-02-25 DIAGNOSIS — E538 Deficiency of other specified B group vitamins: Secondary | ICD-10-CM | POA: Diagnosis not present

## 2024-02-25 DIAGNOSIS — D509 Iron deficiency anemia, unspecified: Secondary | ICD-10-CM

## 2024-02-25 MED ORDER — CYANOCOBALAMIN 1000 MCG/ML IJ SOLN
1000.0000 ug | Freq: Once | INTRAMUSCULAR | Status: AC
  Administered 2024-02-25: 1000 ug via INTRAMUSCULAR
  Filled 2024-02-25: qty 1

## 2024-02-25 NOTE — Patient Instructions (Signed)
 [facility] [place] - A DEPT OF [facility]  Discharge Instructions: Thank you for choosing [facility] to provide your oncology and hematology care.  If you have a lab appointment with the Cancer Center - please note that after April 8th, 2024, all labs will be drawn in the cancer center.  You do not have to check in or register with the main entrance as you have in the past but will complete your check-in in the cancer center.  Wear comfortable clothing and clothing appropriate for easy access to any Portacath or PICC line.   We strive to give you quality time with your provider. You may need to reschedule your appointment if you arrive late (15 or more minutes).  Arriving late affects you and other patients whose appointments are after yours.  Also, if you miss three or more appointments without notifying the office, you may be dismissed from the clinic at the provider's discretion.      For prescription refill requests, have your pharmacy contact our office and allow 72 hours for refills to be completed.    Today you received the following B12 injection, return as scheduled.   To help prevent nausea and vomiting after your treatment, we encourage you to take your nausea medication as directed.  BELOW ARE SYMPTOMS THAT SHOULD BE REPORTED IMMEDIATELY: *FEVER GREATER THAN 100.4 F (38 C) OR HIGHER *CHILLS OR SWEATING *NAUSEA AND VOMITING THAT IS NOT CONTROLLED WITH YOUR NAUSEA MEDICATION *UNUSUAL SHORTNESS OF BREATH *UNUSUAL BRUISING OR BLEEDING *URINARY PROBLEMS (pain or burning when urinating, or frequent urination) *BOWEL PROBLEMS (unusual diarrhea, constipation, pain near the anus) TENDERNESS IN MOUTH AND THROAT WITH OR WITHOUT PRESENCE OF ULCERS (sore throat, sores in mouth, or a toothache) UNUSUAL RASH, SWELLING OR PAIN  UNUSUAL VAGINAL DISCHARGE OR ITCHING   Items with * indicate a potential emergency and should be followed up as soon as  possible or go to the Emergency Department if any problems should occur.  Please show the CHEMOTHERAPY ALERT CARD or IMMUNOTHERAPY ALERT CARD at check-in to the Emergency Department and triage nurse.  Should you have questions after your visit or need to cancel or reschedule your appointment, please contact [facility] [place] - A DEPT OF [facility] [phone]  and follow the prompts.  Office hours are 8:00 a.m. to 4:30 p.m. Monday - Friday. Please note that voicemails left after 4:00 p.m. may not be returned until the following business day.  We are closed weekends and major holidays. You have access to a nurse at all times for urgent questions. Please call the main number to the clinic [phone] and follow the prompts.  For any non-urgent questions, you may also contact your provider using MyChart. We now offer e-Visits for anyone 69 and older to request care online for non-urgent symptoms. For details visit [email or web address].   Also download the MyChart app! Go to the app store, search "MyChart", open the app, select [place], and log in with your MyChart username and password.

## 2024-02-25 NOTE — Progress Notes (Signed)
 Patient refused iron  today, states she wanted to try the B12 injections first to see if that improved her symptoms.

## 2024-02-25 NOTE — Progress Notes (Signed)
 Patient tolerated injection with no complaints voiced. Site clean and dry with no bruising or swelling noted at site. See MAR for details. Band aid applied.  Patient stable during and after injection. VSS with discharge and left in satisfactory condition with no s/s of distress noted.

## 2024-02-28 ENCOUNTER — Encounter: Payer: Self-pay | Admitting: Oncology

## 2024-03-01 ENCOUNTER — Encounter

## 2024-03-01 ENCOUNTER — Encounter: Payer: Self-pay | Admitting: Oncology

## 2024-03-07 ENCOUNTER — Encounter: Payer: Self-pay | Admitting: Internal Medicine

## 2024-03-27 ENCOUNTER — Inpatient Hospital Stay: Attending: Oncology

## 2024-03-28 ENCOUNTER — Ambulatory Visit: Attending: Cardiology | Admitting: Cardiology

## 2024-03-28 NOTE — Progress Notes (Deleted)
 "     Clinical Summary Kara Stewart is a 48 y.o.female  Previously seen by Dr Burnard, last visit 09/2012   1.SVT - prior ER presentation with HRs to 190s, receive adenosine.    2.Orthostatic hypotension - previously had been on midodrine   3. 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  - she is on eliquis  Past Medical History:  Diagnosis Date   Alcohol abuse, in remission    Alcoholic hepatitis with ascites (HCC) 10/05/2016   Has continued to abstain from ETOH since May 2018, known severe hepatic steatosis, HSM, possible early cirrhosis. Treating with URSO  due to elevated AMA. LFTs slowly improving. Concern for recurrent non-tense ascites on exam: patient feels uncomfortable and requesting paracentesis. This is likely   Asthma due to seasonal allergies 04/17/2020   Auditory perceptual disorder    Back pain    Chronic liver disease 11/16/2017   Deep vein thrombosis of portal vein 03/06/2020   Dysphagia 05/29/2020   Fibromyalgia    Generalized anxiety disorder with panic attacks    GERD (gastroesophageal reflux disease) 07/21/2012   Hiatal hernia    History of alcohol abuse 04/26/2015   History of colitis 11/29/2014   History of marijuana use 01/20/2015   Hypertriglyceridemia    Hypothyroid    Iritis    frequent   Iron  deficiency anemia 05/13/2020   Irregular periods 01/11/2014   Macrocytic anemia 03/06/2020   Major depressive disorder 09/10/2020   Ovarian cyst    Polyarthralgia 03/06/2020   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     albuterol  (VENTOLIN  HFA) 108  (90 Base) MCG/ACT inhaler Inhale 2 puffs into the lungs every 6 (six) hours as needed for wheezing or shortness of breath. 18 g 1   apixaban  (ELIQUIS ) 5 MG TABS tablet Take 1 tablet (5 mg total) by mouth 2 (two) times daily. 60 tablet 5   Cholecalciferol (VITAMIN D3) 125 MCG (5000 UT) CAPS Take 1 capsule (5,000 Units total) by mouth daily. 100 capsule 3   cyanocobalamin  (VITAMIN B12) 1000 MCG/ML injection ADMINISTER 1 ML(1000 MCG) IN THE MUSCLE EVERY 30 DAYS 3 mL 3   cyclobenzaprine  (FLEXERIL ) 5 MG tablet Take 1 tablet (5 mg total) by mouth 2 (two) times daily as needed for muscle spasms. 30 tablet 1   fluticasone  (FLONASE ) 50 MCG/ACT nasal spray Place 2 sprays into both nostrils daily. 16 g 6   fluticasone -salmeterol (ADVAIR HFA) 115-21 MCG/ACT inhaler Inhale 2 puffs into the lungs 2 (two) times daily. 1 each 12   hydrOXYzine  (VISTARIL ) 25 MG capsule Take 1 capsule (25 mg total) by mouth every 8 (eight) hours as needed for itching. 30 capsule 3   metroNIDAZOLE  (METROCREAM ) 0.75 % cream Apply topically daily. Apply to face daily 45 g 6   Multiple Vitamins-Minerals (AIRBORNE GUMMIES PO) Take by mouth.     prednisoLONE  acetate (PRED FORTE ) 1 % ophthalmic suspension Place into the left eye.     ursodiol  (ACTIGALL ) 500 MG tablet TAKE 1 TABLET BY MOUTH TWICE DAILY 60 tablet 11   valACYclovir  (VALTREX ) 1000 MG tablet Take 1 tablet (1,000 mg  total) by mouth 3 (three) times daily. 30 tablet 2   No current facility-administered medications for this visit.     Past Surgical History:  Procedure Laterality Date   ABLATION  03/10/2023   BIOPSY N/A 02/05/2015   Procedure: BIOPSY;  Surgeon: Margo LITTIE Haddock, MD;  Location: [facility];  Service: Endoscopy;  Laterality: N/A;   BIOPSY STOMACH  08/2021   BREAST BIOPSY Left 2023   Benign breast tissue with focal fibrosis   COLONOSCOPY     COLONOSCOPY WITH PROPOFOL  N/A 02/05/2015   DOQ:[facility]/mild diverticulosis   ESOPHAGEAL DILATION N/A 11/21/2015   Procedure:  ESOPHAGEAL DILATION;  Surgeon: Lamar CHRISTELLA Hollingshead, MD;  Location: [facility];  Service: Endoscopy;  Laterality: N/A;   ESOPHAGOGASTRODUODENOSCOPY  11/06/2011   [name]: MILD Esophagitis/PATENT ESOPHAGEAL Stricture/  Moderate gastritis. Bx no.hpylori or celiac, +gastritis   ESOPHAGOGASTRODUODENOSCOPY (EGD) WITH PROPOFOL  N/A 03/20/2014   [name]: 1. Mild esophagitis & distal esophagela stricture. 2. small hiatal hernia 3. moderate non-erosive gastritis and mild duodenits   ESOPHAGOGASTRODUODENOSCOPY (EGD) WITH PROPOFOL  N/A 11/21/2015   Dr. Hollingshead: LA grade B esophagitis, MW tear likely source of hematemesis   LAPAROSCOPIC TUBAL LIGATION Bilateral 09/14/2018   Procedure: LAPAROSCOPIC TUBAL LIGATION;  Surgeon: Starla Harland BROCKS, MD;  Location: [facility];  Service: Gynecology;  Laterality: Bilateral;   LIVER BIOPSY  02/23/2020   Procedure: OPEN LIVER BIOPSY;  Surgeon: Dasie Leonor LITTIE, MD;  Location: [facility];  Service: General;;   SAVORY DILATION N/A 03/20/2014   Procedure: SAVORY DILATION;  Surgeon: Margo LITTIE Haddock, MD;  Location: [facility];  Service: Endoscopy;  Laterality: N/A;  dilated with # 12.8, 14,15,16   SPLENECTOMY, TOTAL N/A 02/23/2020   Procedure: SPLENECTOMY;  Surgeon: Dasie Leonor LITTIE, MD;  Location: [facility];  Service: General;  Laterality: N/A;   TOOTH EXTRACTION       Allergies[2]    Family History  Problem Relation Age of Onset   Stomach cancer Paternal Grandfather        colon cancer   Cancer Paternal Grandfather        throat and esophagus   Other Paternal Grandmother        hernia   COPD Paternal Grandmother    Diabetes Paternal Grandmother    Breast cancer Maternal Grandmother    Cancer Maternal Grandmother        skin   Anxiety disorder Maternal Grandmother    Dementia Maternal Grandmother    Diabetes Maternal Grandfather    Heart disease Maternal Grandfather    Dementia Maternal Grandfather    Other Father        varicose veins; stomach issues; hernia    Hypertension Father    Hyperlipidemia Father    Cancer Father        prostate; skin   Arthritis Father        rheumatoid   Neuropathy Father    Rheum arthritis Father    Cancer Mother        skin   Breast cancer Mother    Anxiety disorder Mother    Hypertension Mother    Other Mother        fatty liver   Hyperlipidemia Mother    Liver disease Mother        fatty liver, does not drink.    Sleep apnea Mother    Other Brother        hernia   Thyroid  disease Sister    Hypertension Sister  Healthy Daughter      Social History Kara Stewart reports that she has been smoking cigarettes. She started smoking about 6 years ago. She has a 1.6 pack-year smoking history. She has never been exposed to tobacco smoke. She has never used smokeless tobacco. Kara Stewart reports no history of alcohol use.   Review of Systems CONSTITUTIONAL: No weight loss, fever, chills, weakness or fatigue.  HEENT: Eyes: No visual loss, blurred vision, double vision or yellow sclerae.No hearing loss, sneezing, congestion, runny nose or sore throat.  SKIN: No rash or itching.  CARDIOVASCULAR:  RESPIRATORY: No shortness of breath, cough or sputum.  GASTROINTESTINAL: No anorexia, nausea, vomiting or diarrhea. No abdominal pain or blood.  GENITOURINARY: No burning on urination, no polyuria NEUROLOGICAL: No headache, dizziness, syncope, paralysis, ataxia, numbness or tingling in the extremities. No change in bowel or bladder control.  MUSCULOSKELETAL: No muscle, back pain, joint pain or stiffness.  LYMPHATICS: No enlarged nodes. No history of splenectomy.  PSYCHIATRIC: No history of depression or anxiety.  ENDOCRINOLOGIC: No reports of sweating, cold or heat intolerance. No polyuria or polydipsia.  SABRA   Physical Examination There were no vitals filed for this visit. There were no vitals filed for this visit.  Gen: resting comfortably, no acute distress HEENT: no scleral icterus, pupils equal round and reactive,  no palptable cervical adenopathy,  CV Resp: Clear to auscultation bilaterally GI: abdomen is soft, non-tender, non-distended, normal bowel sounds, no hepatosplenomegaly MSK: extremities are warm, no edema.  Skin: warm, no rash Neuro:  no focal deficits Psych: appropriate affect   Diagnostic Studies     Assessment and Plan        Kara Stewart, M.D., F.A.C.C.     [1]  Allergies Allergen Reactions   Amoxicillin-Pot Clavulanate Anaphylaxis   Penicillins Anaphylaxis    Has patient had a PCN reaction causing immediate rash, facial/tongue/throat swelling, SOB or lightheadedness with hypotension: yes  Has patient had a PCN reaction causing severe rash involving mucus membranes or skin necrosis: No  Has patient had a PCN reaction that required hospitalization Yes  Has patient had a PCN reaction occurring within the last 10 years: No  If all of the above answers are NO, then may proceed with Cephalosporin use.  Has patient had a PCN reaction causing immediate rash, facial/tongue/throat swelling, SOB or lightheadedness with hypotension: yes    Has patient had a PCN reaction causing severe rash involving mucus membranes or skin necrosis: No    Has patient had a PCN reaction that required hospitalization Yes    Has patient had a PCN reaction occurring within the last 10 years: No    If all of the above answers are NO, then may proceed with Cephalosporin use.    Has patient had a PCN reaction causing immediate rash, facial/tongue/throat swelling, SOB or lightheadedness with hypotension: yes Has patient had a PCN reaction causing severe rash involving mucus membranes or skin necrosis: No Has patient had a PCN reaction that required hospitalization Yes Has patient had a PCN reaction occurring within the last 10 years: No If all of the above answers are NO, then may proceed with Cephalosporin use. Has patient had a PCN reaction causing immediate rash,  facial/tongue/throat swelling, SOB or lightheadedness with hypotension: yes Has patient had a PCN reaction causing severe rash involving mucus membranes or skin necrosis: No Has patient had a PCN reaction that required hospitalization Yes Has patient had a PCN reaction occurring within the last 10 years: No  If all of the above answers are NO, then may proceed with Cephalosporin use.    Has patient had a PCN reaction causing immediate rash, facial/tongue/throat swelling, SOB or lightheadedness with hypotension: yes, Has patient had a PCN reaction causing... (TRUNCATED)   Avelox  [Moxifloxacin ]     Tunnel vision, seeing spots, joint pain   Lidocaine  Other (See Comments)    Can use topical lidocaine  but if ingested causes Hallucinations.  Can use topical lidocaine  but if ingested causes Hallucinations.    Can use topical lidocaine  but if ingested causes Hallucinations. Can use topical lidocaine  but if ingested causes Hallucinations.    Can use topical lidocaine  but if ingested causes Hallucinations., Can use topical lidocaine  but if ingested causes Hallucinations.  [2]  Allergies Allergen Reactions   Amoxicillin-Pot Clavulanate Anaphylaxis   Penicillins Anaphylaxis    Has patient had a PCN reaction causing immediate rash, facial/tongue/throat swelling, SOB or lightheadedness with hypotension: yes  Has patient had a PCN reaction causing severe rash involving mucus membranes or skin necrosis: No  Has patient had a PCN reaction that required hospitalization Yes  Has patient had a PCN reaction occurring within the last 10 years: No  If all of the above answers are NO, then may proceed with Cephalosporin use.  Has patient had a PCN reaction causing immediate rash, facial/tongue/throat swelling, SOB or lightheadedness with hypotension: yes    Has patient had a PCN reaction causing severe rash involving mucus membranes or skin necrosis: No    Has patient had a PCN reaction that required  hospitalization Yes    Has patient had a PCN reaction occurring within the last 10 years: No    If all of the above answers are NO, then may proceed with Cephalosporin use.    Has patient had a PCN reaction causing immediate rash, facial/tongue/throat swelling, SOB or lightheadedness with hypotension: yes Has patient had a PCN reaction causing severe rash involving mucus membranes or skin necrosis: No Has patient had a PCN reaction that required hospitalization Yes Has patient had a PCN reaction occurring within the last 10 years: No If all of the above answers are NO, then may proceed with Cephalosporin use. Has patient had a PCN reaction causing immediate rash, facial/tongue/throat swelling, SOB or lightheadedness with hypotension: yes Has patient had a PCN reaction causing severe rash involving mucus membranes or skin necrosis: No Has patient had a PCN reaction that required hospitalization Yes Has patient had a PCN reaction occurring within the last 10 years: No If all of the above answers are NO, then may proceed with Cephalosporin use.    Has patient had a PCN reaction causing immediate rash, facial/tongue/throat swelling, SOB or lightheadedness with hypotension: yes, Has patient had a PCN reaction causing... (TRUNCATED)   Avelox  [Moxifloxacin ]     Tunnel vision, seeing spots, joint pain   Lidocaine  Other (See Comments)    Can use topical lidocaine  but if ingested causes Hallucinations.  Can use topical lidocaine  but if ingested causes Hallucinations.    Can use topical lidocaine  but if ingested causes Hallucinations. Can use topical lidocaine  but if ingested causes Hallucinations.    Can use topical lidocaine  but if ingested causes Hallucinations., Can use topical lidocaine  but if ingested causes Hallucinations.   "

## 2024-03-31 ENCOUNTER — Encounter: Payer: Self-pay | Admitting: Cardiology

## 2024-04-03 ENCOUNTER — Encounter: Payer: Self-pay | Admitting: *Deleted

## 2024-05-17 ENCOUNTER — Inpatient Hospital Stay: Attending: Oncology

## 2024-05-24 ENCOUNTER — Inpatient Hospital Stay: Admitting: Oncology

## 2024-08-09 ENCOUNTER — Ambulatory Visit: Admitting: Internal Medicine

## 2025-02-05 ENCOUNTER — Ambulatory Visit
